# Patient Record
Sex: Female | Born: 1960 | Race: Black or African American | Hispanic: No | Smoking: Former smoker
Health system: Southern US, Community
[De-identification: ages and names within clinical notes are randomized; demographics above are authoritative.]

## PROBLEM LIST (undated history)

## (undated) DIAGNOSIS — S43429A Sprain of unspecified rotator cuff capsule, initial encounter: Secondary | ICD-10-CM

## (undated) DIAGNOSIS — I471 Supraventricular tachycardia, unspecified: Secondary | ICD-10-CM

## (undated) DIAGNOSIS — I272 Pulmonary hypertension, unspecified: Secondary | ICD-10-CM

## (undated) DIAGNOSIS — M549 Dorsalgia, unspecified: Secondary | ICD-10-CM

## (undated) DIAGNOSIS — E876 Hypokalemia: Secondary | ICD-10-CM

## (undated) DIAGNOSIS — F419 Anxiety disorder, unspecified: Secondary | ICD-10-CM

## (undated) DIAGNOSIS — R0602 Shortness of breath: Secondary | ICD-10-CM

## (undated) DIAGNOSIS — Z862 Personal history of diseases of the blood and blood-forming organs and certain disorders involving the immune mechanism: Secondary | ICD-10-CM

## (undated) DIAGNOSIS — I10 Essential (primary) hypertension: Secondary | ICD-10-CM

## (undated) DIAGNOSIS — F29 Unspecified psychosis not due to a substance or known physiological condition: Secondary | ICD-10-CM

## (undated) DIAGNOSIS — N911 Secondary amenorrhea: Secondary | ICD-10-CM

## (undated) DIAGNOSIS — K219 Gastro-esophageal reflux disease without esophagitis: Secondary | ICD-10-CM

## (undated) DIAGNOSIS — R079 Chest pain, unspecified: Secondary | ICD-10-CM

## (undated) DIAGNOSIS — I472 Ventricular tachycardia: Secondary | ICD-10-CM

## (undated) DIAGNOSIS — D869 Sarcoidosis, unspecified: Secondary | ICD-10-CM

## (undated) DIAGNOSIS — G8929 Other chronic pain: Secondary | ICD-10-CM

## (undated) DIAGNOSIS — I509 Heart failure, unspecified: Secondary | ICD-10-CM

## (undated) DIAGNOSIS — I4729 Other ventricular tachycardia: Secondary | ICD-10-CM

## (undated) DIAGNOSIS — J449 Chronic obstructive pulmonary disease, unspecified: Secondary | ICD-10-CM

## (undated) HISTORY — DX: Pulmonary hypertension, unspecified: I27.20

## (undated) HISTORY — DX: Dorsalgia, unspecified: M54.9

## (undated) HISTORY — DX: Chest pain, unspecified: R07.9

## (undated) HISTORY — DX: Other chronic pain: G89.29

## (undated) HISTORY — DX: Other ventricular tachycardia: I47.29

## (undated) HISTORY — DX: Ventricular tachycardia: I47.2

## (undated) HISTORY — DX: Personal history of diseases of the blood and blood-forming organs and certain disorders involving the immune mechanism: Z86.2

## (undated) HISTORY — DX: Anxiety disorder, unspecified: F41.9

## (undated) HISTORY — DX: Heart failure, unspecified: I50.9

## (undated) HISTORY — DX: Sarcoidosis, unspecified: D86.9

## (undated) HISTORY — DX: Sprain of unspecified rotator cuff capsule, initial encounter: S43.429A

## (undated) HISTORY — DX: Gastro-esophageal reflux disease without esophagitis: K21.9

## (undated) HISTORY — DX: Secondary amenorrhea: N91.1

## (undated) HISTORY — DX: Hypokalemia: E87.6

## (undated) HISTORY — DX: Essential (primary) hypertension: I10

## (undated) HISTORY — PX: OTHER SURGICAL HISTORY: SHX169

## (undated) HISTORY — DX: Supraventricular tachycardia, unspecified: I47.10

## (undated) HISTORY — DX: Unspecified psychosis not due to a substance or known physiological condition: F29

## (undated) HISTORY — DX: Supraventricular tachycardia: I47.1

---

## 1997-07-13 ENCOUNTER — Encounter: Admission: RE | Admit: 1997-07-13 | Discharge: 1997-07-13 | Payer: Self-pay | Admitting: Family Medicine

## 1997-09-07 ENCOUNTER — Encounter: Admission: RE | Admit: 1997-09-07 | Discharge: 1997-09-07 | Payer: Self-pay | Admitting: Family Medicine

## 1997-09-13 ENCOUNTER — Encounter: Admission: RE | Admit: 1997-09-13 | Discharge: 1997-09-13 | Payer: Self-pay | Admitting: Family Medicine

## 1997-11-14 ENCOUNTER — Encounter: Admission: RE | Admit: 1997-11-14 | Discharge: 1997-11-14 | Payer: Self-pay | Admitting: Family Medicine

## 1997-11-25 ENCOUNTER — Ambulatory Visit (HOSPITAL_COMMUNITY): Admission: RE | Admit: 1997-11-25 | Discharge: 1997-11-25 | Payer: Self-pay | Admitting: Gastroenterology

## 1997-11-28 ENCOUNTER — Ambulatory Visit (HOSPITAL_COMMUNITY): Admission: RE | Admit: 1997-11-28 | Discharge: 1997-11-28 | Payer: Self-pay | Admitting: Gastroenterology

## 1997-11-28 ENCOUNTER — Encounter: Payer: Self-pay | Admitting: Gastroenterology

## 1997-11-30 ENCOUNTER — Ambulatory Visit (HOSPITAL_COMMUNITY): Admission: RE | Admit: 1997-11-30 | Discharge: 1997-11-30 | Payer: Self-pay | Admitting: Gastroenterology

## 1998-02-14 ENCOUNTER — Encounter: Admission: RE | Admit: 1998-02-14 | Discharge: 1998-02-14 | Payer: Self-pay | Admitting: Family Medicine

## 1998-05-09 ENCOUNTER — Encounter: Admission: RE | Admit: 1998-05-09 | Discharge: 1998-05-09 | Payer: Self-pay | Admitting: Family Medicine

## 1998-06-09 ENCOUNTER — Encounter: Admission: RE | Admit: 1998-06-09 | Discharge: 1998-06-09 | Payer: Self-pay | Admitting: Family Medicine

## 1998-08-28 ENCOUNTER — Encounter: Admission: RE | Admit: 1998-08-28 | Discharge: 1998-08-28 | Payer: Self-pay | Admitting: Family Medicine

## 1998-09-20 ENCOUNTER — Encounter: Admission: RE | Admit: 1998-09-20 | Discharge: 1998-09-20 | Payer: Self-pay | Admitting: Family Medicine

## 1998-11-08 ENCOUNTER — Encounter: Admission: RE | Admit: 1998-11-08 | Discharge: 1998-11-08 | Payer: Self-pay | Admitting: Family Medicine

## 1998-11-24 ENCOUNTER — Encounter: Admission: RE | Admit: 1998-11-24 | Discharge: 1998-11-24 | Payer: Self-pay | Admitting: Family Medicine

## 1998-11-28 ENCOUNTER — Ambulatory Visit (HOSPITAL_COMMUNITY): Admission: RE | Admit: 1998-11-28 | Discharge: 1998-11-28 | Payer: Self-pay | Admitting: Family Medicine

## 1998-12-14 ENCOUNTER — Encounter: Admission: RE | Admit: 1998-12-14 | Discharge: 1998-12-14 | Payer: Self-pay | Admitting: Family Medicine

## 1999-01-04 ENCOUNTER — Encounter: Admission: RE | Admit: 1999-01-04 | Discharge: 1999-01-04 | Payer: Self-pay | Admitting: Family Medicine

## 1999-01-04 ENCOUNTER — Ambulatory Visit (HOSPITAL_COMMUNITY): Admission: RE | Admit: 1999-01-04 | Discharge: 1999-01-04 | Payer: Self-pay | Admitting: Family Medicine

## 1999-01-11 ENCOUNTER — Encounter: Admission: RE | Admit: 1999-01-11 | Discharge: 1999-01-11 | Payer: Self-pay | Admitting: Family Medicine

## 1999-06-22 ENCOUNTER — Encounter: Admission: RE | Admit: 1999-06-22 | Discharge: 1999-06-22 | Payer: Self-pay | Admitting: Family Medicine

## 1999-08-21 ENCOUNTER — Encounter: Admission: RE | Admit: 1999-08-21 | Discharge: 1999-11-19 | Payer: Self-pay | Admitting: *Deleted

## 1999-08-23 ENCOUNTER — Ambulatory Visit (HOSPITAL_COMMUNITY): Admission: RE | Admit: 1999-08-23 | Discharge: 1999-08-23 | Payer: Self-pay | Admitting: Internal Medicine

## 1999-08-23 ENCOUNTER — Encounter: Payer: Self-pay | Admitting: Internal Medicine

## 1999-08-24 ENCOUNTER — Encounter: Admission: RE | Admit: 1999-08-24 | Discharge: 1999-08-24 | Payer: Self-pay | Admitting: *Deleted

## 1999-08-24 ENCOUNTER — Encounter: Payer: Self-pay | Admitting: *Deleted

## 1999-12-19 ENCOUNTER — Encounter: Admission: RE | Admit: 1999-12-19 | Discharge: 1999-12-19 | Payer: Self-pay

## 2000-01-24 ENCOUNTER — Encounter: Admission: RE | Admit: 2000-01-24 | Discharge: 2000-01-24 | Payer: Self-pay | Admitting: Family Medicine

## 2000-10-13 ENCOUNTER — Encounter: Admission: RE | Admit: 2000-10-13 | Discharge: 2000-10-13 | Payer: Self-pay | Admitting: Family Medicine

## 2001-01-02 ENCOUNTER — Encounter: Admission: RE | Admit: 2001-01-02 | Discharge: 2001-01-02 | Payer: Self-pay | Admitting: Family Medicine

## 2001-02-24 ENCOUNTER — Encounter: Payer: Self-pay | Admitting: Internal Medicine

## 2001-02-24 ENCOUNTER — Ambulatory Visit (HOSPITAL_COMMUNITY): Admission: RE | Admit: 2001-02-24 | Discharge: 2001-02-24 | Payer: Self-pay | Admitting: Internal Medicine

## 2001-03-09 ENCOUNTER — Encounter: Admission: RE | Admit: 2001-03-09 | Discharge: 2001-03-09 | Payer: Self-pay | Admitting: Internal Medicine

## 2001-03-09 ENCOUNTER — Encounter: Payer: Self-pay | Admitting: Internal Medicine

## 2001-04-21 ENCOUNTER — Encounter: Admission: RE | Admit: 2001-04-21 | Discharge: 2001-04-21 | Payer: Self-pay | Admitting: Family Medicine

## 2001-08-17 ENCOUNTER — Emergency Department (HOSPITAL_COMMUNITY): Admission: EM | Admit: 2001-08-17 | Discharge: 2001-08-17 | Payer: Self-pay | Admitting: Emergency Medicine

## 2001-08-19 ENCOUNTER — Encounter: Payer: Self-pay | Admitting: Family Medicine

## 2001-08-19 ENCOUNTER — Emergency Department (HOSPITAL_COMMUNITY): Admission: EM | Admit: 2001-08-19 | Discharge: 2001-08-19 | Payer: Self-pay | Admitting: Emergency Medicine

## 2001-08-20 ENCOUNTER — Emergency Department (HOSPITAL_COMMUNITY): Admission: EM | Admit: 2001-08-20 | Discharge: 2001-08-20 | Payer: Self-pay

## 2002-01-25 ENCOUNTER — Encounter: Admission: RE | Admit: 2002-01-25 | Discharge: 2002-01-25 | Payer: Self-pay | Admitting: Family Medicine

## 2002-02-23 ENCOUNTER — Encounter: Admission: RE | Admit: 2002-02-23 | Discharge: 2002-02-23 | Payer: Self-pay | Admitting: Family Medicine

## 2002-04-05 ENCOUNTER — Encounter: Admission: RE | Admit: 2002-04-05 | Discharge: 2002-04-05 | Payer: Self-pay | Admitting: Family Medicine

## 2002-05-12 ENCOUNTER — Encounter: Admission: RE | Admit: 2002-05-12 | Discharge: 2002-05-12 | Payer: Self-pay | Admitting: Family Medicine

## 2002-08-02 ENCOUNTER — Inpatient Hospital Stay (HOSPITAL_COMMUNITY): Admission: EM | Admit: 2002-08-02 | Discharge: 2002-08-03 | Payer: Self-pay | Admitting: Emergency Medicine

## 2002-08-02 ENCOUNTER — Encounter: Payer: Self-pay | Admitting: Emergency Medicine

## 2002-08-10 ENCOUNTER — Encounter: Admission: RE | Admit: 2002-08-10 | Discharge: 2002-08-10 | Payer: Self-pay | Admitting: Family Medicine

## 2002-09-15 ENCOUNTER — Encounter: Admission: RE | Admit: 2002-09-15 | Discharge: 2002-09-15 | Payer: Self-pay | Admitting: Family Medicine

## 2003-02-08 ENCOUNTER — Encounter: Admission: RE | Admit: 2003-02-08 | Discharge: 2003-02-08 | Payer: Self-pay | Admitting: Family Medicine

## 2003-02-18 ENCOUNTER — Encounter: Admission: RE | Admit: 2003-02-18 | Discharge: 2003-02-18 | Payer: Self-pay | Admitting: Family Medicine

## 2003-03-14 ENCOUNTER — Inpatient Hospital Stay (HOSPITAL_COMMUNITY): Admission: EM | Admit: 2003-03-14 | Discharge: 2003-03-30 | Payer: Self-pay | Admitting: Emergency Medicine

## 2003-03-14 ENCOUNTER — Encounter (INDEPENDENT_AMBULATORY_CARE_PROVIDER_SITE_OTHER): Payer: Self-pay | Admitting: Cardiology

## 2003-03-31 HISTORY — PX: CARDIAC CATHETERIZATION: SHX172

## 2003-03-31 HISTORY — PX: OTHER SURGICAL HISTORY: SHX169

## 2003-04-15 ENCOUNTER — Encounter: Admission: RE | Admit: 2003-04-15 | Discharge: 2003-04-15 | Payer: Self-pay | Admitting: Family Medicine

## 2003-04-26 ENCOUNTER — Encounter: Admission: RE | Admit: 2003-04-26 | Discharge: 2003-04-26 | Payer: Self-pay | Admitting: Family Medicine

## 2003-05-02 ENCOUNTER — Inpatient Hospital Stay (HOSPITAL_COMMUNITY): Admission: EM | Admit: 2003-05-02 | Discharge: 2003-05-04 | Payer: Self-pay | Admitting: Emergency Medicine

## 2003-05-10 ENCOUNTER — Encounter: Admission: RE | Admit: 2003-05-10 | Discharge: 2003-08-08 | Payer: Self-pay | Admitting: Internal Medicine

## 2003-05-11 ENCOUNTER — Encounter: Admission: RE | Admit: 2003-05-11 | Discharge: 2003-05-11 | Payer: Self-pay | Admitting: Family Medicine

## 2003-05-23 ENCOUNTER — Ambulatory Visit (HOSPITAL_COMMUNITY): Admission: RE | Admit: 2003-05-23 | Discharge: 2003-05-23 | Payer: Self-pay | Admitting: Internal Medicine

## 2003-05-25 ENCOUNTER — Encounter: Admission: RE | Admit: 2003-05-25 | Discharge: 2003-05-25 | Payer: Self-pay | Admitting: Family Medicine

## 2003-06-23 ENCOUNTER — Encounter: Admission: RE | Admit: 2003-06-23 | Discharge: 2003-06-23 | Payer: Self-pay | Admitting: Sports Medicine

## 2003-06-27 ENCOUNTER — Emergency Department (HOSPITAL_COMMUNITY): Admission: EM | Admit: 2003-06-27 | Discharge: 2003-06-27 | Payer: Self-pay | Admitting: Emergency Medicine

## 2003-06-28 ENCOUNTER — Emergency Department (HOSPITAL_COMMUNITY): Admission: EM | Admit: 2003-06-28 | Discharge: 2003-06-28 | Payer: Self-pay | Admitting: Emergency Medicine

## 2003-07-07 ENCOUNTER — Encounter: Admission: RE | Admit: 2003-07-07 | Discharge: 2003-07-07 | Payer: Self-pay | Admitting: Sports Medicine

## 2003-08-24 ENCOUNTER — Encounter: Admission: RE | Admit: 2003-08-24 | Discharge: 2003-08-24 | Payer: Self-pay | Admitting: Family Medicine

## 2003-09-16 ENCOUNTER — Encounter: Admission: RE | Admit: 2003-09-16 | Discharge: 2003-09-16 | Payer: Self-pay | Admitting: Sports Medicine

## 2003-12-04 ENCOUNTER — Emergency Department (HOSPITAL_COMMUNITY): Admission: EM | Admit: 2003-12-04 | Discharge: 2003-12-04 | Payer: Self-pay | Admitting: Emergency Medicine

## 2004-02-08 ENCOUNTER — Ambulatory Visit: Payer: Self-pay | Admitting: Family Medicine

## 2004-02-15 ENCOUNTER — Ambulatory Visit: Payer: Self-pay | Admitting: Family Medicine

## 2004-03-15 ENCOUNTER — Ambulatory Visit: Payer: Self-pay | Admitting: Internal Medicine

## 2004-06-05 ENCOUNTER — Emergency Department (HOSPITAL_COMMUNITY): Admission: EM | Admit: 2004-06-05 | Discharge: 2004-06-05 | Payer: Self-pay | Admitting: Emergency Medicine

## 2004-07-16 ENCOUNTER — Ambulatory Visit: Payer: Self-pay

## 2004-07-27 ENCOUNTER — Ambulatory Visit: Payer: Self-pay | Admitting: Family Medicine

## 2004-12-07 ENCOUNTER — Encounter (INDEPENDENT_AMBULATORY_CARE_PROVIDER_SITE_OTHER): Payer: Self-pay | Admitting: *Deleted

## 2004-12-07 LAB — CONVERTED CEMR LAB

## 2004-12-13 ENCOUNTER — Ambulatory Visit: Payer: Self-pay | Admitting: Family Medicine

## 2004-12-13 ENCOUNTER — Encounter (INDEPENDENT_AMBULATORY_CARE_PROVIDER_SITE_OTHER): Payer: Self-pay | Admitting: Specialist

## 2004-12-31 ENCOUNTER — Ambulatory Visit: Payer: Self-pay | Admitting: Family Medicine

## 2004-12-31 ENCOUNTER — Inpatient Hospital Stay (HOSPITAL_COMMUNITY): Admission: EM | Admit: 2004-12-31 | Discharge: 2005-01-02 | Payer: Self-pay | Admitting: Emergency Medicine

## 2005-01-09 ENCOUNTER — Ambulatory Visit: Payer: Self-pay | Admitting: Family Medicine

## 2005-01-21 ENCOUNTER — Inpatient Hospital Stay (HOSPITAL_COMMUNITY): Admission: EM | Admit: 2005-01-21 | Discharge: 2005-01-27 | Payer: Self-pay | Admitting: Emergency Medicine

## 2005-01-21 ENCOUNTER — Ambulatory Visit: Payer: Self-pay | Admitting: Sports Medicine

## 2005-01-22 ENCOUNTER — Ambulatory Visit: Payer: Self-pay | Admitting: Critical Care Medicine

## 2005-01-23 ENCOUNTER — Encounter: Payer: Self-pay | Admitting: Cardiovascular Disease

## 2005-01-23 ENCOUNTER — Ambulatory Visit: Payer: Self-pay | Admitting: Cardiovascular Disease

## 2005-02-01 ENCOUNTER — Ambulatory Visit: Payer: Self-pay | Admitting: Family Medicine

## 2005-02-08 ENCOUNTER — Ambulatory Visit: Payer: Self-pay | Admitting: Internal Medicine

## 2005-03-11 ENCOUNTER — Ambulatory Visit: Payer: Self-pay | Admitting: Internal Medicine

## 2005-04-23 ENCOUNTER — Ambulatory Visit: Payer: Self-pay | Admitting: Internal Medicine

## 2005-05-16 ENCOUNTER — Emergency Department (HOSPITAL_COMMUNITY): Admission: EM | Admit: 2005-05-16 | Discharge: 2005-05-16 | Payer: Self-pay | Admitting: Family Medicine

## 2005-05-20 ENCOUNTER — Ambulatory Visit: Payer: Self-pay

## 2005-05-27 ENCOUNTER — Ambulatory Visit: Payer: Self-pay | Admitting: Family Medicine

## 2005-06-07 ENCOUNTER — Encounter: Admission: RE | Admit: 2005-06-07 | Discharge: 2005-07-09 | Payer: Self-pay | Admitting: Internal Medicine

## 2005-06-22 ENCOUNTER — Emergency Department (HOSPITAL_COMMUNITY): Admission: EM | Admit: 2005-06-22 | Discharge: 2005-06-22 | Payer: Self-pay | Admitting: *Deleted

## 2005-06-25 ENCOUNTER — Ambulatory Visit: Payer: Self-pay | Admitting: Family Medicine

## 2005-06-25 ENCOUNTER — Inpatient Hospital Stay (HOSPITAL_COMMUNITY): Admission: EM | Admit: 2005-06-25 | Discharge: 2005-06-28 | Payer: Self-pay | Admitting: *Deleted

## 2005-07-03 ENCOUNTER — Ambulatory Visit: Payer: Self-pay | Admitting: Family Medicine

## 2005-07-18 ENCOUNTER — Ambulatory Visit: Payer: Self-pay | Admitting: Internal Medicine

## 2005-08-01 ENCOUNTER — Encounter (HOSPITAL_COMMUNITY): Admission: RE | Admit: 2005-08-01 | Discharge: 2005-09-10 | Payer: Self-pay | Admitting: Internal Medicine

## 2005-08-07 ENCOUNTER — Ambulatory Visit: Payer: Self-pay | Admitting: Family Medicine

## 2005-08-13 ENCOUNTER — Ambulatory Visit: Payer: Self-pay | Admitting: Family Medicine

## 2005-08-13 ENCOUNTER — Inpatient Hospital Stay (HOSPITAL_COMMUNITY): Admission: EM | Admit: 2005-08-13 | Discharge: 2005-08-16 | Payer: Self-pay | Admitting: Emergency Medicine

## 2005-08-19 ENCOUNTER — Emergency Department (HOSPITAL_COMMUNITY): Admission: EM | Admit: 2005-08-19 | Discharge: 2005-08-19 | Payer: Self-pay | Admitting: Emergency Medicine

## 2005-08-20 ENCOUNTER — Ambulatory Visit (HOSPITAL_COMMUNITY): Admission: RE | Admit: 2005-08-20 | Discharge: 2005-08-20 | Payer: Self-pay | Admitting: Family Medicine

## 2005-08-20 ENCOUNTER — Ambulatory Visit: Payer: Self-pay | Admitting: Sports Medicine

## 2005-08-23 ENCOUNTER — Ambulatory Visit: Payer: Self-pay | Admitting: Family Medicine

## 2005-08-24 ENCOUNTER — Encounter: Payer: Self-pay | Admitting: *Deleted

## 2005-08-25 ENCOUNTER — Inpatient Hospital Stay (HOSPITAL_COMMUNITY): Admission: EM | Admit: 2005-08-25 | Discharge: 2005-08-28 | Payer: Self-pay | Admitting: Emergency Medicine

## 2005-08-25 ENCOUNTER — Ambulatory Visit: Payer: Self-pay | Admitting: Family Medicine

## 2005-08-29 ENCOUNTER — Emergency Department (HOSPITAL_COMMUNITY): Admission: EM | Admit: 2005-08-29 | Discharge: 2005-08-29 | Payer: Self-pay | Admitting: Emergency Medicine

## 2005-09-05 ENCOUNTER — Ambulatory Visit: Payer: Self-pay | Admitting: Family Medicine

## 2005-09-09 ENCOUNTER — Ambulatory Visit (HOSPITAL_COMMUNITY): Payer: Self-pay | Admitting: Psychiatry

## 2005-09-27 ENCOUNTER — Ambulatory Visit (HOSPITAL_COMMUNITY): Payer: Self-pay | Admitting: Psychiatry

## 2005-10-11 ENCOUNTER — Ambulatory Visit: Payer: Self-pay | Admitting: Family Medicine

## 2005-10-17 ENCOUNTER — Ambulatory Visit: Payer: Self-pay | Admitting: Obstetrics & Gynecology

## 2005-10-17 ENCOUNTER — Emergency Department (HOSPITAL_COMMUNITY): Admission: EM | Admit: 2005-10-17 | Discharge: 2005-10-17 | Payer: Self-pay | Admitting: *Deleted

## 2005-11-18 ENCOUNTER — Ambulatory Visit: Payer: Self-pay | Admitting: Internal Medicine

## 2005-11-25 ENCOUNTER — Ambulatory Visit: Payer: Self-pay | Admitting: Family Medicine

## 2005-12-12 ENCOUNTER — Encounter (INDEPENDENT_AMBULATORY_CARE_PROVIDER_SITE_OTHER): Payer: Self-pay | Admitting: Cardiology

## 2005-12-12 ENCOUNTER — Inpatient Hospital Stay (HOSPITAL_COMMUNITY): Admission: EM | Admit: 2005-12-12 | Discharge: 2005-12-15 | Payer: Self-pay | Admitting: Emergency Medicine

## 2005-12-12 ENCOUNTER — Ambulatory Visit: Payer: Self-pay | Admitting: Sports Medicine

## 2005-12-18 ENCOUNTER — Encounter: Admission: RE | Admit: 2005-12-18 | Discharge: 2005-12-18 | Payer: Self-pay | Admitting: Sports Medicine

## 2005-12-24 ENCOUNTER — Emergency Department (HOSPITAL_COMMUNITY): Admission: EM | Admit: 2005-12-24 | Discharge: 2005-12-25 | Payer: Self-pay | Admitting: Emergency Medicine

## 2005-12-24 ENCOUNTER — Ambulatory Visit: Payer: Self-pay | Admitting: Family Medicine

## 2005-12-26 ENCOUNTER — Ambulatory Visit: Payer: Self-pay | Admitting: Psychiatry

## 2005-12-26 ENCOUNTER — Ambulatory Visit: Payer: Self-pay | Admitting: Family Medicine

## 2005-12-26 ENCOUNTER — Inpatient Hospital Stay (HOSPITAL_COMMUNITY): Admission: EM | Admit: 2005-12-26 | Discharge: 2005-12-29 | Payer: Self-pay | Admitting: Family Medicine

## 2006-01-01 ENCOUNTER — Emergency Department (HOSPITAL_COMMUNITY): Admission: EM | Admit: 2006-01-01 | Discharge: 2006-01-01 | Payer: Self-pay | Admitting: Emergency Medicine

## 2006-01-01 ENCOUNTER — Inpatient Hospital Stay (HOSPITAL_COMMUNITY): Admission: AD | Admit: 2006-01-01 | Discharge: 2006-01-07 | Payer: Self-pay | Admitting: Psychiatry

## 2006-01-04 ENCOUNTER — Ambulatory Visit (HOSPITAL_COMMUNITY): Admission: RE | Admit: 2006-01-04 | Discharge: 2006-01-04 | Payer: Self-pay | Admitting: Psychiatry

## 2006-01-20 ENCOUNTER — Ambulatory Visit: Payer: Self-pay | Admitting: Family Medicine

## 2006-01-31 ENCOUNTER — Ambulatory Visit: Payer: Self-pay | Admitting: Family Medicine

## 2006-02-15 ENCOUNTER — Ambulatory Visit: Payer: Self-pay | Admitting: Family Medicine

## 2006-02-15 ENCOUNTER — Inpatient Hospital Stay (HOSPITAL_COMMUNITY): Admission: EM | Admit: 2006-02-15 | Discharge: 2006-02-17 | Payer: Self-pay | Admitting: Emergency Medicine

## 2006-02-16 ENCOUNTER — Ambulatory Visit: Payer: Self-pay | Admitting: *Deleted

## 2006-02-17 ENCOUNTER — Inpatient Hospital Stay (HOSPITAL_COMMUNITY): Admission: AD | Admit: 2006-02-17 | Discharge: 2006-02-24 | Payer: Self-pay | Admitting: *Deleted

## 2006-03-21 ENCOUNTER — Ambulatory Visit: Payer: Self-pay | Admitting: Internal Medicine

## 2006-04-14 ENCOUNTER — Encounter: Payer: Self-pay | Admitting: Family Medicine

## 2006-04-14 ENCOUNTER — Ambulatory Visit: Payer: Self-pay | Admitting: Family Medicine

## 2006-04-14 LAB — CONVERTED CEMR LAB
AST: 16 units/L (ref 0–37)
BUN: 15 mg/dL (ref 6–23)
Calcium: 9.9 mg/dL (ref 8.4–10.5)
Chloride: 98 meq/L (ref 96–112)
Creatinine, Ser: 0.9 mg/dL (ref 0.40–1.20)

## 2006-06-05 DIAGNOSIS — K219 Gastro-esophageal reflux disease without esophagitis: Secondary | ICD-10-CM

## 2006-06-05 DIAGNOSIS — I10 Essential (primary) hypertension: Secondary | ICD-10-CM | POA: Insufficient documentation

## 2006-06-05 DIAGNOSIS — F411 Generalized anxiety disorder: Secondary | ICD-10-CM | POA: Insufficient documentation

## 2006-06-05 DIAGNOSIS — I428 Other cardiomyopathies: Secondary | ICD-10-CM | POA: Insufficient documentation

## 2006-06-06 ENCOUNTER — Encounter (INDEPENDENT_AMBULATORY_CARE_PROVIDER_SITE_OTHER): Payer: Self-pay | Admitting: *Deleted

## 2006-06-23 ENCOUNTER — Telehealth (INDEPENDENT_AMBULATORY_CARE_PROVIDER_SITE_OTHER): Payer: Self-pay | Admitting: *Deleted

## 2006-06-23 ENCOUNTER — Telehealth: Payer: Self-pay | Admitting: Family Medicine

## 2006-07-15 ENCOUNTER — Telehealth: Payer: Self-pay | Admitting: Family Medicine

## 2006-08-08 ENCOUNTER — Encounter: Payer: Self-pay | Admitting: Family Medicine

## 2006-08-08 ENCOUNTER — Ambulatory Visit: Payer: Self-pay | Admitting: Family Medicine

## 2006-08-08 LAB — CONVERTED CEMR LAB
ALT: 13 units/L (ref 0–35)
AST: 13 units/L (ref 0–37)
Alkaline Phosphatase: 53 units/L (ref 39–117)
Creatinine, Ser: 0.92 mg/dL (ref 0.40–1.20)
Total Bilirubin: 0.5 mg/dL (ref 0.3–1.2)

## 2006-09-16 ENCOUNTER — Telehealth: Payer: Self-pay | Admitting: Family Medicine

## 2006-09-22 ENCOUNTER — Telehealth (INDEPENDENT_AMBULATORY_CARE_PROVIDER_SITE_OTHER): Payer: Self-pay | Admitting: *Deleted

## 2006-09-22 ENCOUNTER — Encounter: Payer: Self-pay | Admitting: Family Medicine

## 2006-10-02 ENCOUNTER — Encounter: Payer: Self-pay | Admitting: Family Medicine

## 2006-10-06 ENCOUNTER — Emergency Department (HOSPITAL_COMMUNITY): Admission: EM | Admit: 2006-10-06 | Discharge: 2006-10-07 | Payer: Self-pay | Admitting: Emergency Medicine

## 2006-11-06 ENCOUNTER — Telehealth (INDEPENDENT_AMBULATORY_CARE_PROVIDER_SITE_OTHER): Payer: Self-pay | Admitting: Family Medicine

## 2006-12-23 ENCOUNTER — Telehealth: Payer: Self-pay | Admitting: *Deleted

## 2006-12-26 ENCOUNTER — Encounter (INDEPENDENT_AMBULATORY_CARE_PROVIDER_SITE_OTHER): Payer: Self-pay | Admitting: Family Medicine

## 2007-02-05 ENCOUNTER — Ambulatory Visit: Payer: Self-pay | Admitting: Family Medicine

## 2007-03-11 ENCOUNTER — Ambulatory Visit: Payer: Self-pay | Admitting: Internal Medicine

## 2007-05-04 ENCOUNTER — Encounter (INDEPENDENT_AMBULATORY_CARE_PROVIDER_SITE_OTHER): Payer: Self-pay | Admitting: Family Medicine

## 2007-05-04 ENCOUNTER — Ambulatory Visit: Payer: Self-pay | Admitting: Family Medicine

## 2007-05-04 LAB — CONVERTED CEMR LAB
Angiotensin 1 Converting Enzyme: 26 units/L (ref 9–67)
BUN: 18 mg/dL (ref 6–23)
CO2: 27 meq/L (ref 19–32)
Chloride: 100 meq/L (ref 96–112)
Creatinine, Ser: 1.01 mg/dL (ref 0.40–1.20)
HDL: 70 mg/dL (ref >39)
LDL Cholesterol: 111 mg/dL — ABNORMAL HIGH (ref 0–99)

## 2007-05-09 ENCOUNTER — Encounter (INDEPENDENT_AMBULATORY_CARE_PROVIDER_SITE_OTHER): Payer: Self-pay | Admitting: *Deleted

## 2007-06-27 ENCOUNTER — Ambulatory Visit: Payer: Self-pay | Admitting: Family Medicine

## 2007-06-27 ENCOUNTER — Inpatient Hospital Stay (HOSPITAL_COMMUNITY): Admission: EM | Admit: 2007-06-27 | Discharge: 2007-07-01 | Payer: Self-pay | Admitting: Emergency Medicine

## 2007-06-27 ENCOUNTER — Encounter: Payer: Self-pay | Admitting: Family Medicine

## 2007-07-08 ENCOUNTER — Encounter: Payer: Self-pay | Admitting: Family Medicine

## 2007-07-09 ENCOUNTER — Ambulatory Visit: Payer: Self-pay | Admitting: Family Medicine

## 2007-07-09 DIAGNOSIS — R0902 Hypoxemia: Secondary | ICD-10-CM | POA: Insufficient documentation

## 2007-07-15 ENCOUNTER — Ambulatory Visit: Payer: Self-pay | Admitting: Family Medicine

## 2007-07-15 ENCOUNTER — Encounter (INDEPENDENT_AMBULATORY_CARE_PROVIDER_SITE_OTHER): Payer: Self-pay | Admitting: Family Medicine

## 2007-07-23 LAB — CONVERTED CEMR LAB
BUN: 16 mg/dL (ref 6–23)
Chloride: 104 meq/L (ref 96–112)
Hemoglobin: 11 g/dL — ABNORMAL LOW (ref 12.0–15.0)
Potassium: 4.2 meq/L (ref 3.5–5.3)
RBC: 3.78 M/uL — ABNORMAL LOW (ref 3.87–5.11)
RDW: 15.1 % (ref 11.5–15.5)
Sodium: 141 meq/L (ref 135–145)

## 2007-07-29 ENCOUNTER — Ambulatory Visit: Payer: Self-pay | Admitting: Family Medicine

## 2007-07-29 ENCOUNTER — Encounter (INDEPENDENT_AMBULATORY_CARE_PROVIDER_SITE_OTHER): Payer: Self-pay | Admitting: Family Medicine

## 2007-07-29 LAB — CONVERTED CEMR LAB
Ferritin: 38 ng/mL (ref 10–291)
Saturation Ratios: 18 % — ABNORMAL LOW (ref 20–55)
UIBC: 270 ug/dL

## 2007-09-03 ENCOUNTER — Ambulatory Visit: Payer: Self-pay | Admitting: Family Medicine

## 2007-09-03 LAB — CONVERTED CEMR LAB: Hemoglobin: 12.9 g/dL

## 2007-09-18 ENCOUNTER — Encounter (INDEPENDENT_AMBULATORY_CARE_PROVIDER_SITE_OTHER): Payer: Self-pay | Admitting: Family Medicine

## 2007-10-06 ENCOUNTER — Ambulatory Visit: Payer: Self-pay | Admitting: Family Medicine

## 2007-11-20 ENCOUNTER — Telehealth: Payer: Self-pay | Admitting: *Deleted

## 2007-12-04 ENCOUNTER — Emergency Department (HOSPITAL_COMMUNITY): Admission: EM | Admit: 2007-12-04 | Discharge: 2007-12-04 | Payer: Self-pay | Admitting: Emergency Medicine

## 2007-12-07 ENCOUNTER — Ambulatory Visit: Payer: Self-pay | Admitting: Internal Medicine

## 2007-12-08 ENCOUNTER — Encounter (INDEPENDENT_AMBULATORY_CARE_PROVIDER_SITE_OTHER): Payer: Self-pay | Admitting: Family Medicine

## 2007-12-10 ENCOUNTER — Telehealth: Payer: Self-pay | Admitting: *Deleted

## 2007-12-17 ENCOUNTER — Telehealth: Payer: Self-pay | Admitting: *Deleted

## 2007-12-22 ENCOUNTER — Telehealth: Payer: Self-pay | Admitting: *Deleted

## 2007-12-22 ENCOUNTER — Ambulatory Visit: Payer: Self-pay | Admitting: Family Medicine

## 2007-12-24 ENCOUNTER — Ambulatory Visit: Payer: Self-pay | Admitting: Family Medicine

## 2007-12-25 ENCOUNTER — Encounter: Payer: Self-pay | Admitting: *Deleted

## 2007-12-31 ENCOUNTER — Encounter: Payer: Self-pay | Admitting: *Deleted

## 2007-12-31 ENCOUNTER — Ambulatory Visit: Payer: Self-pay | Admitting: Family Medicine

## 2008-01-06 ENCOUNTER — Ambulatory Visit: Payer: Self-pay | Admitting: Internal Medicine

## 2008-02-01 ENCOUNTER — Telehealth: Payer: Self-pay | Admitting: *Deleted

## 2008-02-05 ENCOUNTER — Ambulatory Visit: Payer: Self-pay | Admitting: Family Medicine

## 2008-03-22 ENCOUNTER — Ambulatory Visit: Payer: Self-pay | Admitting: Internal Medicine

## 2008-04-11 ENCOUNTER — Inpatient Hospital Stay (HOSPITAL_COMMUNITY): Admission: EM | Admit: 2008-04-11 | Discharge: 2008-04-26 | Payer: Self-pay | Admitting: Emergency Medicine

## 2008-04-11 ENCOUNTER — Telehealth (INDEPENDENT_AMBULATORY_CARE_PROVIDER_SITE_OTHER): Payer: Self-pay | Admitting: *Deleted

## 2008-04-11 ENCOUNTER — Encounter: Payer: Self-pay | Admitting: Internal Medicine

## 2008-04-11 ENCOUNTER — Ambulatory Visit: Payer: Self-pay | Admitting: Family Medicine

## 2008-04-12 ENCOUNTER — Encounter: Payer: Self-pay | Admitting: Family Medicine

## 2008-04-18 ENCOUNTER — Encounter (INDEPENDENT_AMBULATORY_CARE_PROVIDER_SITE_OTHER): Payer: Self-pay | Admitting: Family Medicine

## 2008-04-19 ENCOUNTER — Telehealth: Payer: Self-pay | Admitting: Internal Medicine

## 2008-04-28 ENCOUNTER — Telehealth: Payer: Self-pay | Admitting: *Deleted

## 2008-05-02 ENCOUNTER — Ambulatory Visit: Payer: Self-pay | Admitting: Internal Medicine

## 2008-05-02 ENCOUNTER — Encounter (INDEPENDENT_AMBULATORY_CARE_PROVIDER_SITE_OTHER): Payer: Self-pay | Admitting: Family Medicine

## 2008-05-02 ENCOUNTER — Telehealth: Payer: Self-pay | Admitting: *Deleted

## 2008-05-03 ENCOUNTER — Telehealth (INDEPENDENT_AMBULATORY_CARE_PROVIDER_SITE_OTHER): Payer: Self-pay | Admitting: Family Medicine

## 2008-05-03 ENCOUNTER — Ambulatory Visit: Payer: Self-pay | Admitting: Family Medicine

## 2008-05-03 DIAGNOSIS — D869 Sarcoidosis, unspecified: Secondary | ICD-10-CM | POA: Insufficient documentation

## 2008-05-04 ENCOUNTER — Telehealth: Payer: Self-pay | Admitting: *Deleted

## 2008-05-06 ENCOUNTER — Telehealth (INDEPENDENT_AMBULATORY_CARE_PROVIDER_SITE_OTHER): Payer: Self-pay | Admitting: Family Medicine

## 2008-05-10 ENCOUNTER — Telehealth: Payer: Self-pay | Admitting: *Deleted

## 2008-05-28 ENCOUNTER — Emergency Department (HOSPITAL_COMMUNITY): Admission: EM | Admit: 2008-05-28 | Discharge: 2008-05-28 | Payer: Self-pay | Admitting: Emergency Medicine

## 2008-05-29 ENCOUNTER — Emergency Department (HOSPITAL_COMMUNITY): Admission: EM | Admit: 2008-05-29 | Discharge: 2008-05-29 | Payer: Self-pay | Admitting: Emergency Medicine

## 2008-05-30 ENCOUNTER — Telehealth (INDEPENDENT_AMBULATORY_CARE_PROVIDER_SITE_OTHER): Payer: Self-pay | Admitting: Family Medicine

## 2008-06-02 ENCOUNTER — Ambulatory Visit: Payer: Self-pay | Admitting: Family Medicine

## 2008-06-03 ENCOUNTER — Telehealth: Payer: Self-pay | Admitting: *Deleted

## 2008-06-06 ENCOUNTER — Telehealth: Payer: Self-pay | Admitting: *Deleted

## 2008-06-10 ENCOUNTER — Telehealth (INDEPENDENT_AMBULATORY_CARE_PROVIDER_SITE_OTHER): Payer: Self-pay | Admitting: Family Medicine

## 2008-06-10 ENCOUNTER — Encounter: Payer: Self-pay | Admitting: Family Medicine

## 2008-06-10 ENCOUNTER — Ambulatory Visit: Payer: Self-pay | Admitting: Family Medicine

## 2008-06-10 LAB — CONVERTED CEMR LAB
AST: 13 units/L (ref 0–37)
Alkaline Phosphatase: 52 units/L (ref 39–117)
BUN: 13 mg/dL (ref 6–23)
Band Neutrophils: 0 % (ref 0–10)
Basophils Absolute: 0 10*3/uL (ref 0.0–0.1)
Basophils Relative: 1 % (ref 0–1)
Creatinine, Ser: 0.84 mg/dL (ref 0.40–1.20)
Eosinophils Absolute: 0.3 10*3/uL (ref 0.0–0.7)
Eosinophils Relative: 4 % (ref 0–5)
Glucose, Bld: 99 mg/dL (ref 70–99)
Lymphocytes Relative: 20 % (ref 12–46)
MCHC: 32.2 g/dL (ref 30.0–36.0)
MCV: 89.3 fL (ref 78.0–100.0)
Platelets: 261 10*3/uL (ref 150–400)
RDW: 15.1 % (ref 11.5–15.5)
Total Bilirubin: 0.7 mg/dL (ref 0.3–1.2)
WBC: 6.8 10*3/uL (ref 4.0–10.5)

## 2008-06-13 ENCOUNTER — Encounter: Admission: RE | Admit: 2008-06-13 | Discharge: 2008-06-13 | Payer: Self-pay | Admitting: Sports Medicine

## 2008-06-16 ENCOUNTER — Telehealth (INDEPENDENT_AMBULATORY_CARE_PROVIDER_SITE_OTHER): Payer: Self-pay | Admitting: Family Medicine

## 2008-06-17 ENCOUNTER — Ambulatory Visit: Payer: Self-pay | Admitting: Family Medicine

## 2008-06-20 ENCOUNTER — Ambulatory Visit: Payer: Self-pay | Admitting: Family Medicine

## 2008-06-21 ENCOUNTER — Telehealth: Payer: Self-pay | Admitting: *Deleted

## 2008-06-22 ENCOUNTER — Telehealth: Payer: Self-pay | Admitting: *Deleted

## 2008-06-24 ENCOUNTER — Ambulatory Visit: Payer: Self-pay | Admitting: Family Medicine

## 2008-07-04 ENCOUNTER — Ambulatory Visit: Payer: Self-pay | Admitting: Internal Medicine

## 2008-07-05 ENCOUNTER — Encounter: Payer: Self-pay | Admitting: Internal Medicine

## 2008-07-06 ENCOUNTER — Telehealth: Payer: Self-pay | Admitting: Internal Medicine

## 2008-07-20 ENCOUNTER — Telehealth (INDEPENDENT_AMBULATORY_CARE_PROVIDER_SITE_OTHER): Payer: Self-pay | Admitting: *Deleted

## 2008-07-27 ENCOUNTER — Telehealth (INDEPENDENT_AMBULATORY_CARE_PROVIDER_SITE_OTHER): Payer: Self-pay | Admitting: *Deleted

## 2008-07-27 ENCOUNTER — Telehealth (INDEPENDENT_AMBULATORY_CARE_PROVIDER_SITE_OTHER): Payer: Self-pay | Admitting: Family Medicine

## 2008-08-08 ENCOUNTER — Telehealth (INDEPENDENT_AMBULATORY_CARE_PROVIDER_SITE_OTHER): Payer: Self-pay | Admitting: *Deleted

## 2008-08-08 ENCOUNTER — Telehealth (INDEPENDENT_AMBULATORY_CARE_PROVIDER_SITE_OTHER): Payer: Self-pay | Admitting: Family Medicine

## 2008-08-09 ENCOUNTER — Telehealth (INDEPENDENT_AMBULATORY_CARE_PROVIDER_SITE_OTHER): Payer: Self-pay | Admitting: *Deleted

## 2008-08-11 ENCOUNTER — Inpatient Hospital Stay (HOSPITAL_COMMUNITY): Admission: EM | Admit: 2008-08-11 | Discharge: 2008-08-16 | Payer: Self-pay | Admitting: Emergency Medicine

## 2008-08-11 ENCOUNTER — Ambulatory Visit: Payer: Self-pay | Admitting: Family Medicine

## 2008-08-11 ENCOUNTER — Telehealth: Payer: Self-pay | Admitting: *Deleted

## 2008-08-12 ENCOUNTER — Telehealth: Payer: Self-pay | Admitting: Internal Medicine

## 2008-08-16 ENCOUNTER — Encounter (INDEPENDENT_AMBULATORY_CARE_PROVIDER_SITE_OTHER): Payer: Self-pay | Admitting: Family Medicine

## 2008-08-18 ENCOUNTER — Telehealth: Payer: Self-pay | Admitting: *Deleted

## 2008-08-19 ENCOUNTER — Telehealth: Payer: Self-pay | Admitting: *Deleted

## 2008-08-19 ENCOUNTER — Telehealth (INDEPENDENT_AMBULATORY_CARE_PROVIDER_SITE_OTHER): Payer: Self-pay | Admitting: Family Medicine

## 2008-08-21 ENCOUNTER — Telehealth (INDEPENDENT_AMBULATORY_CARE_PROVIDER_SITE_OTHER): Payer: Self-pay | Admitting: Family Medicine

## 2008-08-22 ENCOUNTER — Telehealth: Payer: Self-pay | Admitting: Internal Medicine

## 2008-08-22 ENCOUNTER — Encounter: Payer: Self-pay | Admitting: Family Medicine

## 2008-08-22 DIAGNOSIS — I498 Other specified cardiac arrhythmias: Secondary | ICD-10-CM | POA: Insufficient documentation

## 2008-08-23 ENCOUNTER — Inpatient Hospital Stay (HOSPITAL_COMMUNITY): Admission: EM | Admit: 2008-08-23 | Discharge: 2008-08-30 | Payer: Self-pay | Admitting: Emergency Medicine

## 2008-08-23 ENCOUNTER — Telehealth (INDEPENDENT_AMBULATORY_CARE_PROVIDER_SITE_OTHER): Payer: Self-pay | Admitting: Family Medicine

## 2008-08-23 ENCOUNTER — Ambulatory Visit: Payer: Self-pay | Admitting: Family Medicine

## 2008-08-23 ENCOUNTER — Ambulatory Visit: Payer: Self-pay | Admitting: Cardiology

## 2008-08-24 ENCOUNTER — Encounter: Payer: Self-pay | Admitting: Cardiology

## 2008-08-26 HISTORY — PX: CARDIAC CATHETERIZATION: SHX172

## 2008-08-30 ENCOUNTER — Telehealth (INDEPENDENT_AMBULATORY_CARE_PROVIDER_SITE_OTHER): Payer: Self-pay | Admitting: *Deleted

## 2008-09-02 ENCOUNTER — Ambulatory Visit: Payer: Self-pay | Admitting: Internal Medicine

## 2008-09-05 DIAGNOSIS — IMO0002 Reserved for concepts with insufficient information to code with codable children: Secondary | ICD-10-CM

## 2008-09-06 ENCOUNTER — Telehealth: Payer: Self-pay | Admitting: *Deleted

## 2008-09-08 ENCOUNTER — Encounter (INDEPENDENT_AMBULATORY_CARE_PROVIDER_SITE_OTHER): Payer: Self-pay | Admitting: Family Medicine

## 2008-09-09 ENCOUNTER — Telehealth: Payer: Self-pay | Admitting: Family Medicine

## 2008-09-09 ENCOUNTER — Encounter (INDEPENDENT_AMBULATORY_CARE_PROVIDER_SITE_OTHER): Payer: Self-pay | Admitting: *Deleted

## 2008-09-10 ENCOUNTER — Ambulatory Visit: Payer: Self-pay | Admitting: Family Medicine

## 2008-09-10 ENCOUNTER — Inpatient Hospital Stay (HOSPITAL_COMMUNITY): Admission: EM | Admit: 2008-09-10 | Discharge: 2008-09-14 | Payer: Self-pay | Admitting: Emergency Medicine

## 2008-09-10 ENCOUNTER — Encounter: Payer: Self-pay | Admitting: Family Medicine

## 2008-09-14 ENCOUNTER — Encounter (INDEPENDENT_AMBULATORY_CARE_PROVIDER_SITE_OTHER): Payer: Self-pay | Admitting: *Deleted

## 2008-09-15 ENCOUNTER — Telehealth (INDEPENDENT_AMBULATORY_CARE_PROVIDER_SITE_OTHER): Payer: Self-pay | Admitting: Family Medicine

## 2008-09-21 ENCOUNTER — Encounter: Payer: Self-pay | Admitting: Internal Medicine

## 2008-09-21 ENCOUNTER — Encounter (INDEPENDENT_AMBULATORY_CARE_PROVIDER_SITE_OTHER): Payer: Self-pay | Admitting: Family Medicine

## 2008-09-21 ENCOUNTER — Ambulatory Visit: Payer: Self-pay | Admitting: Cardiology

## 2008-09-21 DIAGNOSIS — I509 Heart failure, unspecified: Secondary | ICD-10-CM | POA: Insufficient documentation

## 2008-09-22 ENCOUNTER — Encounter: Payer: Self-pay | Admitting: Cardiology

## 2008-09-22 ENCOUNTER — Encounter: Payer: Self-pay | Admitting: Internal Medicine

## 2008-09-30 ENCOUNTER — Ambulatory Visit: Payer: Self-pay | Admitting: Cardiology

## 2008-09-30 ENCOUNTER — Telehealth: Payer: Self-pay | Admitting: Cardiology

## 2008-09-30 ENCOUNTER — Encounter: Payer: Self-pay | Admitting: Internal Medicine

## 2008-10-04 ENCOUNTER — Ambulatory Visit: Payer: Self-pay | Admitting: Cardiology

## 2008-10-04 LAB — CONVERTED CEMR LAB
BUN: 7 mg/dL (ref 6–23)
CO2: 33 meq/L — ABNORMAL HIGH (ref 19–32)
Chloride: 104 meq/L (ref 96–112)
Creatinine, Ser: 0.8 mg/dL (ref 0.4–1.2)
Potassium: 3.9 meq/L (ref 3.5–5.1)

## 2008-10-07 ENCOUNTER — Telehealth: Payer: Self-pay | Admitting: Cardiology

## 2008-10-11 ENCOUNTER — Encounter: Payer: Self-pay | Admitting: Family Medicine

## 2008-10-12 ENCOUNTER — Encounter: Payer: Self-pay | Admitting: Cardiology

## 2008-10-14 ENCOUNTER — Telehealth: Payer: Self-pay | Admitting: Cardiology

## 2008-10-14 ENCOUNTER — Telehealth (INDEPENDENT_AMBULATORY_CARE_PROVIDER_SITE_OTHER): Payer: Self-pay | Admitting: *Deleted

## 2008-10-18 ENCOUNTER — Telehealth: Payer: Self-pay | Admitting: Cardiology

## 2008-10-18 ENCOUNTER — Encounter: Payer: Self-pay | Admitting: Family Medicine

## 2008-10-19 ENCOUNTER — Telehealth: Payer: Self-pay | Admitting: Internal Medicine

## 2008-10-25 ENCOUNTER — Telehealth: Payer: Self-pay | Admitting: Cardiology

## 2008-10-27 ENCOUNTER — Ambulatory Visit: Payer: Self-pay | Admitting: Family Medicine

## 2008-10-27 ENCOUNTER — Encounter: Payer: Self-pay | Admitting: Family Medicine

## 2008-10-28 ENCOUNTER — Encounter (INDEPENDENT_AMBULATORY_CARE_PROVIDER_SITE_OTHER): Payer: Self-pay | Admitting: *Deleted

## 2008-10-28 LAB — CONVERTED CEMR LAB
BUN: 13 mg/dL (ref 6–23)
CO2: 28 meq/L (ref 19–32)
Calcium: 9.5 mg/dL (ref 8.4–10.5)
Glucose, Bld: 83 mg/dL (ref 70–99)

## 2008-11-01 ENCOUNTER — Telehealth: Payer: Self-pay | Admitting: Cardiology

## 2008-11-01 ENCOUNTER — Telehealth: Payer: Self-pay | Admitting: Family Medicine

## 2008-11-05 ENCOUNTER — Encounter: Payer: Self-pay | Admitting: Internal Medicine

## 2008-11-11 ENCOUNTER — Telehealth: Payer: Self-pay | Admitting: Cardiology

## 2008-11-16 ENCOUNTER — Encounter: Payer: Self-pay | Admitting: Internal Medicine

## 2008-11-22 ENCOUNTER — Encounter (INDEPENDENT_AMBULATORY_CARE_PROVIDER_SITE_OTHER): Payer: Self-pay | Admitting: *Deleted

## 2008-11-22 ENCOUNTER — Encounter: Payer: Self-pay | Admitting: Cardiology

## 2008-11-22 ENCOUNTER — Ambulatory Visit: Payer: Self-pay

## 2008-11-22 ENCOUNTER — Ambulatory Visit: Payer: Self-pay | Admitting: Cardiology

## 2008-12-07 ENCOUNTER — Telehealth: Payer: Self-pay | Admitting: Family Medicine

## 2008-12-08 ENCOUNTER — Ambulatory Visit: Payer: Self-pay | Admitting: Internal Medicine

## 2008-12-08 ENCOUNTER — Inpatient Hospital Stay (HOSPITAL_COMMUNITY): Admission: EM | Admit: 2008-12-08 | Discharge: 2008-12-24 | Payer: Self-pay | Admitting: Emergency Medicine

## 2008-12-08 ENCOUNTER — Ambulatory Visit: Payer: Self-pay | Admitting: Family Medicine

## 2008-12-11 ENCOUNTER — Encounter (INDEPENDENT_AMBULATORY_CARE_PROVIDER_SITE_OTHER): Payer: Self-pay | Admitting: Internal Medicine

## 2008-12-11 ENCOUNTER — Ambulatory Visit: Payer: Self-pay | Admitting: Vascular Surgery

## 2008-12-11 ENCOUNTER — Encounter: Payer: Self-pay | Admitting: Family Medicine

## 2008-12-14 ENCOUNTER — Encounter (INDEPENDENT_AMBULATORY_CARE_PROVIDER_SITE_OTHER): Payer: Self-pay | Admitting: *Deleted

## 2008-12-29 ENCOUNTER — Telehealth (INDEPENDENT_AMBULATORY_CARE_PROVIDER_SITE_OTHER): Payer: Self-pay | Admitting: *Deleted

## 2008-12-29 ENCOUNTER — Encounter: Payer: Self-pay | Admitting: Cardiology

## 2008-12-31 ENCOUNTER — Encounter: Payer: Self-pay | Admitting: Internal Medicine

## 2008-12-31 ENCOUNTER — Encounter: Payer: Self-pay | Admitting: Cardiology

## 2009-01-02 ENCOUNTER — Telehealth: Payer: Self-pay | Admitting: Family Medicine

## 2009-01-04 ENCOUNTER — Telehealth: Payer: Self-pay | Admitting: Cardiology

## 2009-01-06 ENCOUNTER — Encounter: Payer: Self-pay | Admitting: Cardiology

## 2009-01-13 ENCOUNTER — Encounter: Payer: Self-pay | Admitting: Cardiology

## 2009-01-16 ENCOUNTER — Ambulatory Visit: Payer: Self-pay | Admitting: Internal Medicine

## 2009-01-16 ENCOUNTER — Ambulatory Visit: Payer: Self-pay | Admitting: Cardiology

## 2009-01-17 ENCOUNTER — Telehealth: Payer: Self-pay | Admitting: Family Medicine

## 2009-01-18 ENCOUNTER — Encounter: Payer: Self-pay | Admitting: Cardiology

## 2009-01-23 ENCOUNTER — Telehealth (INDEPENDENT_AMBULATORY_CARE_PROVIDER_SITE_OTHER): Payer: Self-pay | Admitting: *Deleted

## 2009-01-23 ENCOUNTER — Telehealth: Payer: Self-pay | Admitting: Cardiology

## 2009-01-23 LAB — CONVERTED CEMR LAB
Calcium: 9.4 mg/dL (ref 8.4–10.5)
GFR calc non Af Amer: 114.57 mL/min (ref >60)
Potassium: 3.6 meq/L (ref 3.5–5.1)
Sodium: 141 meq/L (ref 135–145)

## 2009-01-24 ENCOUNTER — Encounter: Payer: Self-pay | Admitting: Cardiology

## 2009-01-30 ENCOUNTER — Encounter: Payer: Self-pay | Admitting: Cardiology

## 2009-02-16 ENCOUNTER — Ambulatory Visit: Payer: Self-pay | Admitting: Cardiology

## 2009-02-16 LAB — CONVERTED CEMR LAB
BUN: 14 mg/dL (ref 6–23)
CO2: 33 meq/L — ABNORMAL HIGH (ref 19–32)
Calcium: 9.4 mg/dL (ref 8.4–10.5)
Chloride: 101 meq/L (ref 96–112)
Creatinine, Ser: 0.9 mg/dL (ref 0.4–1.2)
Glucose, Bld: 105 mg/dL — ABNORMAL HIGH (ref 70–99)
Potassium: 3.1 meq/L — ABNORMAL LOW (ref 3.5–5.1)
Sodium: 141 meq/L (ref 135–145)

## 2009-02-17 ENCOUNTER — Telehealth: Payer: Self-pay | Admitting: Cardiology

## 2009-02-20 ENCOUNTER — Encounter: Payer: Self-pay | Admitting: Cardiology

## 2009-02-27 ENCOUNTER — Ambulatory Visit: Payer: Self-pay | Admitting: Cardiology

## 2009-03-03 ENCOUNTER — Telehealth (INDEPENDENT_AMBULATORY_CARE_PROVIDER_SITE_OTHER): Payer: Self-pay | Admitting: *Deleted

## 2009-03-03 LAB — CONVERTED CEMR LAB
BUN: 16 mg/dL (ref 6–23)
Calcium: 9.2 mg/dL (ref 8.4–10.5)
Creatinine, Ser: 1 mg/dL (ref 0.4–1.2)
GFR calc non Af Amer: 75.88 mL/min (ref >60)
Glucose, Bld: 97 mg/dL (ref 70–99)

## 2009-03-10 ENCOUNTER — Encounter: Payer: Self-pay | Admitting: Cardiology

## 2009-03-14 ENCOUNTER — Ambulatory Visit: Payer: Self-pay | Admitting: Cardiology

## 2009-04-11 ENCOUNTER — Telehealth: Payer: Self-pay | Admitting: Family Medicine

## 2009-04-11 ENCOUNTER — Encounter: Payer: Self-pay | Admitting: Cardiology

## 2009-04-11 ENCOUNTER — Ambulatory Visit: Payer: Self-pay | Admitting: Cardiovascular Disease

## 2009-04-11 ENCOUNTER — Ambulatory Visit: Payer: Self-pay

## 2009-04-11 ENCOUNTER — Ambulatory Visit (HOSPITAL_COMMUNITY): Admission: RE | Admit: 2009-04-11 | Discharge: 2009-04-11 | Payer: Self-pay | Admitting: Cardiology

## 2009-04-25 ENCOUNTER — Ambulatory Visit: Payer: Self-pay | Admitting: Internal Medicine

## 2009-05-01 ENCOUNTER — Encounter: Payer: Self-pay | Admitting: Family Medicine

## 2009-05-01 ENCOUNTER — Ambulatory Visit: Payer: Self-pay | Admitting: Family Medicine

## 2009-05-01 LAB — CONVERTED CEMR LAB
BUN: 19 mg/dL (ref 6–23)
Calcium: 9.7 mg/dL (ref 8.4–10.5)
MCHC: 31.2 g/dL (ref 30.0–36.0)
MCV: 90.4 fL (ref 78.0–100.0)
Nitrite: NEGATIVE
Potassium: 4.2 meq/L (ref 3.5–5.3)
RDW: 13.7 % (ref 11.5–15.5)
Sodium: 139 meq/L (ref 135–145)
Specific Gravity, Urine: 1.02
WBC Urine, dipstick: NEGATIVE

## 2009-05-02 ENCOUNTER — Telehealth: Payer: Self-pay | Admitting: Family Medicine

## 2009-05-05 ENCOUNTER — Telehealth: Payer: Self-pay | Admitting: Family Medicine

## 2009-05-08 ENCOUNTER — Telehealth: Payer: Self-pay | Admitting: Family Medicine

## 2009-05-25 ENCOUNTER — Telehealth: Payer: Self-pay | Admitting: Family Medicine

## 2009-05-26 ENCOUNTER — Ambulatory Visit: Payer: Self-pay | Admitting: Family Medicine

## 2009-05-26 ENCOUNTER — Encounter: Payer: Self-pay | Admitting: Family Medicine

## 2009-05-26 LAB — CONVERTED CEMR LAB
Glucose, Bld: 90 mg/dL (ref 70–99)
Potassium: 4.6 meq/L (ref 3.5–5.3)
Sodium: 139 meq/L (ref 135–145)

## 2009-06-08 ENCOUNTER — Ambulatory Visit: Payer: Self-pay | Admitting: Cardiology

## 2009-06-09 LAB — CONVERTED CEMR LAB: Pro B Natriuretic peptide (BNP): 58 pg/mL (ref 0.0–100.0)

## 2009-06-22 ENCOUNTER — Telehealth: Payer: Self-pay | Admitting: Cardiology

## 2009-06-22 ENCOUNTER — Encounter: Payer: Self-pay | Admitting: Cardiology

## 2009-08-14 ENCOUNTER — Encounter: Payer: Self-pay | Admitting: Family Medicine

## 2009-09-07 ENCOUNTER — Encounter: Payer: Self-pay | Admitting: Family Medicine

## 2009-09-18 ENCOUNTER — Ambulatory Visit: Payer: Self-pay | Admitting: Cardiology

## 2009-09-20 ENCOUNTER — Ambulatory Visit: Payer: Self-pay | Admitting: Cardiology

## 2009-09-22 ENCOUNTER — Telehealth: Payer: Self-pay | Admitting: Cardiology

## 2009-09-25 LAB — CONVERTED CEMR LAB
CO2: 29 meq/L (ref 19–32)
Chloride: 108 meq/L (ref 96–112)
Creatinine, Ser: 0.8 mg/dL (ref 0.4–1.2)
Potassium: 4.2 meq/L (ref 3.5–5.1)

## 2009-09-28 ENCOUNTER — Inpatient Hospital Stay (HOSPITAL_BASED_OUTPATIENT_CLINIC_OR_DEPARTMENT_OTHER): Admission: RE | Admit: 2009-09-28 | Discharge: 2009-09-28 | Payer: Self-pay | Admitting: Cardiology

## 2009-09-28 ENCOUNTER — Ambulatory Visit: Payer: Self-pay | Admitting: Cardiology

## 2009-10-05 ENCOUNTER — Telehealth: Payer: Self-pay | Admitting: Family Medicine

## 2009-10-20 ENCOUNTER — Telehealth: Payer: Self-pay | Admitting: *Deleted

## 2009-10-25 ENCOUNTER — Ambulatory Visit: Payer: Self-pay | Admitting: Cardiology

## 2009-10-27 ENCOUNTER — Telehealth: Payer: Self-pay | Admitting: Family Medicine

## 2009-11-01 ENCOUNTER — Telehealth: Payer: Self-pay | Admitting: Family Medicine

## 2009-11-02 ENCOUNTER — Telehealth: Payer: Self-pay | Admitting: Family Medicine

## 2009-12-01 ENCOUNTER — Encounter: Payer: Self-pay | Admitting: Cardiology

## 2009-12-08 ENCOUNTER — Ambulatory Visit: Payer: Self-pay | Admitting: Cardiology

## 2009-12-22 ENCOUNTER — Encounter (INDEPENDENT_AMBULATORY_CARE_PROVIDER_SITE_OTHER): Payer: Self-pay | Admitting: *Deleted

## 2009-12-31 ENCOUNTER — Encounter: Payer: Self-pay | Admitting: Family Medicine

## 2010-01-08 ENCOUNTER — Encounter: Payer: Self-pay | Admitting: Family Medicine

## 2010-01-23 ENCOUNTER — Ambulatory Visit: Payer: Self-pay | Admitting: Cardiology

## 2010-01-24 LAB — CONVERTED CEMR LAB
ALT: 9 units/L (ref 0–35)
AST: 13 units/L (ref 0–37)
Albumin: 4 g/dL (ref 3.5–5.2)
Alkaline Phosphatase: 57 units/L (ref 39–117)
BUN: 23 mg/dL (ref 6–23)
Bilirubin, Direct: 0.2 mg/dL (ref 0.0–0.3)
CO2: 30 meq/L (ref 19–32)
Chloride: 106 meq/L (ref 96–112)
GFR calc non Af Amer: 100.7 mL/min (ref >60)
Glucose, Bld: 69 mg/dL — ABNORMAL LOW (ref 70–99)
Potassium: 4.5 meq/L (ref 3.5–5.1)
Sodium: 143 meq/L (ref 135–145)
Total Protein: 7.7 g/dL (ref 6.0–8.3)

## 2010-02-08 ENCOUNTER — Encounter: Payer: Self-pay | Admitting: Family Medicine

## 2010-02-14 ENCOUNTER — Ambulatory Visit: Payer: Self-pay | Admitting: Family Medicine

## 2010-02-14 DIAGNOSIS — J069 Acute upper respiratory infection, unspecified: Secondary | ICD-10-CM | POA: Insufficient documentation

## 2010-02-16 ENCOUNTER — Telehealth: Payer: Self-pay | Admitting: Family Medicine

## 2010-02-28 ENCOUNTER — Telehealth (INDEPENDENT_AMBULATORY_CARE_PROVIDER_SITE_OTHER): Payer: Self-pay | Admitting: *Deleted

## 2010-03-05 ENCOUNTER — Encounter: Payer: Self-pay | Admitting: Family Medicine

## 2010-03-07 ENCOUNTER — Ambulatory Visit: Payer: Self-pay | Admitting: Internal Medicine

## 2010-03-14 ENCOUNTER — Telehealth: Payer: Self-pay | Admitting: Cardiology

## 2010-03-16 ENCOUNTER — Ambulatory Visit (HOSPITAL_COMMUNITY)
Admission: RE | Admit: 2010-03-16 | Discharge: 2010-03-16 | Payer: Self-pay | Source: Home / Self Care | Attending: Family Medicine | Admitting: Family Medicine

## 2010-03-21 ENCOUNTER — Encounter: Payer: Self-pay | Admitting: Cardiology

## 2010-03-23 ENCOUNTER — Ambulatory Visit: Payer: Self-pay

## 2010-03-26 ENCOUNTER — Telehealth: Payer: Self-pay | Admitting: Cardiology

## 2010-04-11 ENCOUNTER — Ambulatory Visit
Admission: RE | Admit: 2010-04-11 | Discharge: 2010-04-11 | Payer: Self-pay | Source: Home / Self Care | Attending: Family Medicine | Admitting: Family Medicine

## 2010-04-11 DIAGNOSIS — R300 Dysuria: Secondary | ICD-10-CM | POA: Insufficient documentation

## 2010-04-11 DIAGNOSIS — M545 Low back pain: Secondary | ICD-10-CM | POA: Insufficient documentation

## 2010-04-11 LAB — CONVERTED CEMR LAB
Bilirubin Urine: NEGATIVE
Blood in Urine, dipstick: NEGATIVE
Protein, U semiquant: NEGATIVE
Urobilinogen, UA: 1

## 2010-04-18 ENCOUNTER — Telehealth: Payer: Self-pay | Admitting: Cardiology

## 2010-04-23 ENCOUNTER — Ambulatory Visit
Admission: RE | Admit: 2010-04-23 | Discharge: 2010-04-23 | Payer: Self-pay | Source: Home / Self Care | Attending: Cardiology | Admitting: Cardiology

## 2010-04-23 ENCOUNTER — Encounter: Payer: Self-pay | Admitting: Cardiology

## 2010-04-23 ENCOUNTER — Other Ambulatory Visit: Payer: Self-pay | Admitting: Cardiology

## 2010-04-23 LAB — BASIC METABOLIC PANEL
BUN: 22 mg/dL (ref 6–23)
CO2: 29 mEq/L (ref 19–32)
Calcium: 9.5 mg/dL (ref 8.4–10.5)
Chloride: 101 mEq/L (ref 96–112)
Creatinine, Ser: 0.8 mg/dL (ref 0.4–1.2)
GFR: 103.66 mL/min (ref >60.00)
Glucose, Bld: 71 mg/dL (ref 70–99)
Potassium: 4.2 mEq/L (ref 3.5–5.1)
Sodium: 138 mEq/L (ref 135–145)

## 2010-04-23 LAB — BRAIN NATRIURETIC PEPTIDE: Pro B Natriuretic peptide (BNP): 54.4 pg/mL (ref 0.0–100.0)

## 2010-04-26 ENCOUNTER — Telehealth: Payer: Self-pay | Admitting: Cardiology

## 2010-04-29 ENCOUNTER — Encounter: Payer: Self-pay | Admitting: Emergency Medicine

## 2010-04-30 ENCOUNTER — Ambulatory Visit: Admission: RE | Admit: 2010-04-30 | Discharge: 2010-04-30 | Payer: Self-pay | Source: Home / Self Care

## 2010-04-30 ENCOUNTER — Ambulatory Visit (HOSPITAL_COMMUNITY)
Admission: RE | Admit: 2010-04-30 | Discharge: 2010-04-30 | Payer: Self-pay | Source: Home / Self Care | Attending: Cardiology | Admitting: Cardiology

## 2010-05-08 NOTE — Letter (Signed)
Summary: [facility]  [facility] Patient [facility]   Imported By: Marilynne Drivers 12/20/2009 15:40:48  _____________________________________________________________________  External Attachment:    Type:   Image     Comment:   External Document

## 2010-05-08 NOTE — Cardiovascular Report (Signed)
Summary: Office Visit   Office Visit   Imported By: Sallee Provencal 05/01/2009 13:24:42  _____________________________________________________________________  External Attachment:    Type:   Image     Comment:   External Document

## 2010-05-08 NOTE — Assessment & Plan Note (Signed)
Summary: Hospital H&P   Primary Care Provider:  Danise Mina MD   History of Present Illness: Rachel [name] is a 50 year old female with stage IV pulmonary sarcoidosis presenting with respiratory distress. She was recently discharged from [facility] on 08/30/2008 after a CHF exacerbation. During that stay, she had a cardiac cath that showed EF 55% with normal wall motion and normal coronary arteries. She does have moderate to severe pulmonary HTN. She was treated with Lasix, diuresed 6 , and was at her dry weight of 75 kg prior to DC. She was not discharged with Lasix. Since then, she has noticed increased swelling in her legs and difficulty breathing. Upon presentation to the ED tonight, she was found to be tachycardic, tachypnic, with O2 sat 72% on 5LNC. CXR shows moderate CHF superimposed on severe pulmonary fibrosis with bilateral pleural effusions. She was placed on Bipap and is now sating mid 90s with RR 24-44. She was also given her first dose of Lasix in the ED.   The patient endorses fever/chills, CP described as "tightness," orthopnea, cough, nausea, dizziness, lower extremity swelling. She denies HA, vomiting, weight gain, abdominal pain, lower extremity pain.  Medications Prior to Update: 1)  Albuterol 90 Mcg/act Aers (Albuterol) .... Inhale 2 Puff Using Inhaler Every 4-6 Hours Needed Shortnes of Breath.  You Have Permission To Refill Early. 2)  Coreg 3.125 Mg Tabs (Carvedilol) .... Take 1 By Mouth Two Times A Day 3)  Nasonex 50 Mcg/act Susp (Mometasone Furoate) .... Spray 2 Spray Into Both Nostrils Once A Day As Needed 4)  Nexium 40 Mg Cpdr (Esomeprazole Magnesium) .... Take 1 Capsule By Mouth Every Morning 5)  Risperdal 0.25 Mg Tabs (Risperidone) .... Take 1 By Mouth At Bedtime 6)  Flovent Hfa 110 Mcg/act  Aero (Fluticasone Propionate  Hfa) .... Take One To Two Puffs Two Times A Day 7)  Ibuprofen 600 Mg Tabs (Ibuprofen) .Marland Kitchen.. 1 By Mouth Every 6 Hours As Needed 8)  Albuterol Sulfate (2.5  Mg/23m) 0.083% Nebu (Albuterol Sulfate) .... Inhale With Nebulizer Machine Q 4 Hours If Still Having Shortness of Breath With Inhallers. 9)  Astepro 0.15 % Soln (Azelastine Hcl) ..Marland Kitchen. 1-2 Sprays Each Nostril Up To Twice Daily If Needed 10)  Flonase 50 Mcg/act Susp (Fluticasone Propionate) .... 2 Sprays in Each Nostril 11)  Oxygen .... 5-6 Liters 12)  Revatio 20 Mg Tabs (Sildenafil Citrate) .... Take 1 By Mouth Three Times A Day  Allergies (verified): 1)  Demerol 2)  Ultram 3)  Doxycycline  Past History:  Past Medical History: Last updated: 09/02/2008 Allergic rhinitis,  BL Cr:1.0,  GERD,  H/o chest wall pain near defibrillator,  H/o fractured L arm/hand (when?); Fx L foot (2002),  h/o iron deficiency anemia.,  H/o secondary amenorrhea/irregular menses,  Hosp. Admision 9/25-27 for CHF exacerbation,  Hosp. 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 Daughter has new baby now.   Risk Factors: Smoking Status: quit (07/09/2007)  Past Surgical History: Cardiac Catheterization: 55% EF with normal coronary arteries and normal wall motion - 08/26/08 Adenosine Cardiolyte: no ischemia/infarct; marked global LV hypokinesis; Resting EF 22% - 03/31/2003 Cardiac Cath: Globally depressed LV fxn w/ EF 20%; idiopathic dilated cardiomyopathy (?d/t sarcoid?) - 03/31/2003, Cardiac MR: EF 40%; no discrete e/o LV myocardial sarcoid involvement; study ended early d/t chest pain - 03/31/2003 PFTs ([name]): FVC 2000 (56%), FEV1 1400 (47%), ratio 0.7; insignif response to bronchodilator; stable from 1/05 - 12/08/2003, PFTs: mod restriction, FVC 1.9L, FEV1 1.6L, both 53% predicted; slt response to bronchodilators - 04/08/2002 St. Jude single chamber defibrillator placed (Dr. Cristopher Peru) - 03/31/2003  Review of Systems       per HPI, otherwise negative  Physical Exam  General:  Vitals: T BP 122/82 P 115 RR 46 02 96% Bipapalert, well-developed, well-nourished, well-hydrated, and anxious appearing on Bipap.   Head:  normocephalic and atraumatic.   Eyes:  vision grossly intact, pupils equal, pupils round, and pupils reactive to light.   Ears:  R ear normal and L ear normal.   Nose:  no external deformity.   Mouth:  pharynx pink and moist.   Neck:  supple, no masses, and no JVD.   Lungs:  occasional crackles; supraclavicular retractions. no wheezing. wearing Bipap. Heart:  tachycardic. regular rhythm.  Abdomen:  soft, NT, ND, +BS Pulses:  2+ peripheral pulses Extremities:  trace peripheral edema to mid calves Neurologic:  CN II-XII grossly intact; nonfocal Skin:  no rashes Psych:  +anxious.  Additional Exam:  CXR: mod CHF superimposed upon severe interstitial pulmonary fibrosis and bilateral pleural effusions. LABS: BNP 501, BMP WNL, POC CE neg, WBC 10.9, Hgb 11.7, platelets 265.  7.35/49/117 (Bipap) EKG: Sinus tachycardia, no ST  changes   Impression & Recommendations:  Problem # 1:  Hosp for DYSPNEA (ICD-786.05) Patient with respiratory distress. Will admit to telemetry bed and continue Bipap per RT with instructions to wean with parameters to keep O2 86-92%. We will diurese with Lasix 80 IV two times a day with parameters to hold if SBP <100. We will add Solumedrol to the regimen to treat the sarcoidosis/acute inflammation component. As the patient endorses fever/chills and cough, we will cover HCAP. Ms. Olejnik also endorses CP. We will cycle CE x 3 (acute ischemia low likelihood as patient had clean cath x 1 week ago), obtain CTangio to evaluate for PE.  Problem # 2:  PULMONARY SARCOIDOSIS (ICD-135) Patient's Pulmonologist is [name].  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   Problem # 4:  ANXIETY (ICD-300.00) Patient with significant anxiety regarding her breathing. States that she does not tolerate many medications and does not want anything that will make her "loopy."  Problem # 5:  FEN/GI NPO while on Bipap, monitor electrolytes.  Problem # 6:  PPX PPI for PUD, heparin for DVT  Problem # 7:  DISPO Pending improvement.  Complete Medication List: 1)  Albuterol 90 Mcg/act Aers (Albuterol) .Marland KitchenMarland KitchenMarland Kitchen  Inhale 2 puff using inhaler every 4-6 hours needed shortnes of breath.  you have permission to refill early. 2)  Coreg 3.125 Mg Tabs (Carvedilol) .... Take 1 by mouth two times a day 3)  Nasonex 50 Mcg/act Susp (Mometasone furoate) .... Spray 2 spray into both nostrils once a day as needed 4)  Nexium 40 Mg Cpdr (Esomeprazole magnesium) .... Take 1 capsule by mouth every morning 5)  Risperdal 0.25 Mg Tabs (Risperidone) .... Take 1 by mouth at bedtime 6)  Flovent Hfa 110 Mcg/act Aero (Fluticasone propionate  hfa) .... Take one to two puffs two times a day 7)  Ibuprofen 600 Mg Tabs (Ibuprofen) .Marland Kitchen.. 1 by mouth every 6 hours as needed 8)  Albuterol Sulfate (2.5  Mg/26m) 0.083% Nebu (Albuterol sulfate) .... Inhale with nebulizer machine q 4 hours if still having shortness of breath with inhallers. 9)  Astepro 0.15 % Soln (Azelastine hcl) ..Marland Kitchen. 1-2 sprays each nostril up to twice daily if needed 10)  Flonase 50 Mcg/act Susp (Fluticasone propionate) .... 2 sprays in each nostril 11)  Oxygen  .... 5-6 liters 12)  Revatio 20 Mg Tabs (Sildenafil citrate) .... Take 1 by mouth three times a day  Appended Document: Orders Update Medications Added ATIVAN 1 MG  TABS (LORAZEPAM) take 1/2 to 1 rab by mouth at hs as needed anxiety          Clinical Lists Changes  Medications: Added new medication of ATIVAN 1 MG  TABS (LORAZEPAM) take 1/2 to 1 rab by mouth at hs as needed anxiety - Signed Rx of ATIVAN 1 MG  TABS (LORAZEPAM) take 1/2 to 1 rab by mouth at hs as needed anxiety;  #30 x 0;  Signed;  Entered by: EBriscoe DeutscherMD;  Authorized by: EBriscoe DeutscherMD;  Method used: Printed then faxed to [id number] [address] [place]  [address] Ph: [phone] Fax: [phone] of ATIVAN 1 MG  TABS (LORAZEPAM) take 1/2 to 1 rab by mouth at hs as needed anxiety;  #30 x 0;  Signed;  Entered by: EBriscoe DeutscherMD;  Authorized by: EBriscoe DeutscherMD;  Method used: Printed then faxed to [id number] [address], [place]  [address] Ph: [phone] Fax: [phone]   Prescriptions: ATIVAN 1 MG  TABS (LORAZEPAM) take 1/2 to 1 rab by mouth at hs as needed anxiety  #30 x 0   Entered and Authorized by:   EBriscoe DeutscherMD   Signed by:   EBriscoe DeutscherMD on 09/20/2008   Method used:   Printed then faxed to ...       [id number] (retail)       [address] [place]  [address]      Ph: [phone]      Fax: [phone]  RxID::   [id number] 1 MG  TABS (LORAZEPAM) take 1/2 to 1 rab by mouth at hs as needed anxiety  #30 x 0    Entered and Authorized by:   EBriscoe DeutscherMD   Signed by:   EBriscoe DeutscherMD on 09/20/2008   Method used:   Printed then faxed to ...       [id number] (retail)       [address]  [place], [place]  [address]       Ph: [phone]       Fax: [phone]   RxID:   [id number]  ONLY 1 RX SENT. 09/30/08.

## 2010-05-08 NOTE — Progress Notes (Signed)
Summary: refill  Phone Note Refill Request Call back at Home Phone [phone] Message from:  Patient  Refills Requested: Medication #1:  FLOVENT HFA 110 MCG/ACT  AERO Take one to two puffs two times a day   Supply Requested: 3 months  Medication #2:  NEXIUM 40 MG CPDR one tab by mouth each morning for heartburn / reflux   Supply Requested: 3 months  Medication #3:  POTASSIUM CHLORIDE CRYS CR 20 MEQ CR-TABS take two tabs in am and 1 tab in the pm   Supply Requested: 3 months  Medication #4:  COREG 3.125 MG TABS 1 two times a day   Supply Requested: 3 months Risperdal [facility](states she gave info to Jefferson and he wrote it on the yellow sheet) pls let pt know this has been taken care of  Initial call taken by: Audie Clear,  February 16, 2010 11:43 AM  Follow-up for Phone Call        [phone]- doctors line Refilled via phone please let pt know Follow-up by: Vic Blackbird MD,  February 16, 2010 5:12 PM    Prescriptions: NEXIUM 40 MG CPDR (ESOMEPRAZOLE MAGNESIUM) one tab by mouth each morning for heartburn / reflux  #90 x 1   Entered and Authorized by:   Vic Blackbird MD   Signed by:   Vic Blackbird MD on 02/16/2010   Method used:   Telephoned to ...       [facility] (mail-order)             , [place]         Ph: [phone]       Fax: [phone]   RxID:   [id number] POTASSIUM CHLORIDE CRYS CR 20 MEQ CR-TABS (POTASSIUM CHLORIDE CRYS CR) take two tabs in am and 1 tab in the pm  #270 x 1   Entered and Authorized by:   Vic Blackbird MD   Signed by:   Vic Blackbird MD on 02/16/2010   Method used:   Telephoned to ...       [address] [facility] (mail-order)             , [place]         Ph: [phone]       Fax: [phone]   RxID:   [id number] COREG 3.125 MG TABS (CARVEDILOL) 1 two times a day  #180 x 1   Entered and Authorized by:   Vic Blackbird MD   Signed by:   Vic Blackbird MD on 02/16/2010   Method used:   Telephoned to ...       [facility] (mail-order)          , [place]         Ph: [phone]       Fax: [phone]   RxID:   [id number] FLOVENT HFA 110 MCG/ACT  AERO (FLUTICASONE PROPIONATE  HFA) Take one to two puffs two times a day  #3 x 3   Entered and Authorized by:   Vic Blackbird MD   Signed by:   Vic Blackbird MD on 02/16/2010   Method used:   Telephoned to ...       [facility] (mail-order)             , [place]         Ph: [phone]       Fax: [phone]   RxID:   [id number] RISPERDAL 0.5 MG TABS (RISPERIDONE) 1 by mouth at bedtime  #90 x 1  Entered and Authorized by:   Vic Blackbird MD   Signed by:   Vic Blackbird MD on 02/16/2010   Method used:   Telephoned to ...       [facility] (mail-order)             , [place]         Ph: [phone]       Fax: [phone]   RxID:   [id number]

## 2010-05-08 NOTE — Consult Note (Signed)
Summary: [place] Adm/dc  [facility]   Imported By: Tobin Chad 03/08/2010 11:10:14  _____________________________________________________________________  External Attachment:    Type:   Image     Comment:   External Document

## 2010-05-08 NOTE — Cardiovascular Report (Signed)
Summary: Pre Cath Orders   Pre Cath Orders   Imported By: Sallee Provencal 09/28/2009 10:19:26  _____________________________________________________________________  External Attachment:    Type:   Image     Comment:   External Document

## 2010-05-08 NOTE — Miscellaneous (Signed)
  Clinical Lists Changes  Problems: Removed problem of CHF - EJECTION FRACTION < 50% (ICD-428.22) Changed problem from CHF (ICD-428.0) to CONGESTIVE HEART FAILURE, RIGHT (ICD-428.0)

## 2010-05-08 NOTE — Assessment & Plan Note (Signed)
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 She is now on Revatio 80 mg tid.  She feels like this has been helping her.  She has doubled her 6 minute walk from 200 ft to 400 ft.  She is more active.  She continues to use 5-6 L/min oxygen.  She has been evaluated for heart/lung transplant at [facility] as an inpatient and needs to raise around $10,000 to continue evaluation.  Her church is helping her with fundraising.  Also of note, her ICD is nearing ERI.  Given her end stage lung disease and normalized LV function, it has been decided to turn off tachy therapies and not place a new.  Patient is getting over a recent gastroenteritis.   Labs (11/10): BNP 91, creatinine 1.0, K 4 Labs (2/11): K 4.6, creatinine 0.86 Labs (3/11): BNP 58  Current Medications (verified): 1)  Albuterol 90 Mcg/act Aers (Albuterol) .... Inhale 2 Puff Using Inhaler Every 4-6 Hours Needed Shortnes of Breath.  You Have Permission To Refill Early. 2)  Risperdal 0.5 Mg Tabs (Risperidone) .Marland Kitchen.. 1 By Mouth At Bedtime 3)  Flovent Hfa 110 Mcg/act  Aero (Fluticasone Propionate  Hfa) .... Take One To Two Puffs Two Times A Day 4)  Oxygen .... 5-6 Liters 5)  Revatio 20 Mg Tabs (Sildenafil Citrate) .... Take 4  Tablets Every 8 Hours 6)  Lasix 40 Mg Tabs (Furosemide) .Marland Kitchen.. 1every Other Day Daily 7)  Tylenol 325 Mg Tabs (Acetaminophen) .... As Needed 8)  Coreg 3.125 Mg Tabs (Carvedilol) .Marland Kitchen.. 1 Two Times A Day 9)  Potassium Chloride Crys Cr 20 Meq Cr-Tabs (Potassium Chloride Crys Cr) .... Take Two Tabs in Am and 1 Tab in The Pm 10)  Aspirin 81 Mg  Tabs (Aspirin) .Marland Kitchen.. 1 Tab Once Daily  Allergies (verified): 1)  ! * Steroids High Dose 2)  Demerol 3)  Ultram 4)  Darvocet-N 100 5)  Doxycycline  Past History:  Past Medical History: 1.  Sarcoidosis:  Stage  IV pulmonary sarcoid on home O2.  She has been referred to [facility] for lung transplant evaluation.  2.  Cardiomyopathy:  Cardiac MRI in 2004 with no evidence for infiltrative disease but EF 40%.  Echo (9/10): EF 60%, severely dilated RV with moderately decreased systolic function, moderate RAE, PASP 83 mmHg. 3.  Pulmonary hypertension with RV failure:  Moderate to severe, likely secondary to sarcoidosis and parenchymal lung disease/hypoxic pulmonary vasoconstriction.  RHC 5/10 showed PA 67/30 (mean 46) with mean PCWP 6 mmHg. Patient started on Revatio in 5/10.  6 minute walk 7/10: 200 feet.  Echo (9/10): EF 60%, severely dilated RV with moderately decreased systolic function, moderate RAE, PASP 83 mmHg. RHC (9/10) with mean RA 15, PA 74/36, CI 2.8.  Repeat RHC after diuresis with mean RA 3, PA 60/22, mean PCWP 4.  Echo (1/11): EF 37-62%, grade I diastolic dysfunction, moderately dilated RV, mild to moderate RV dysfunction, moderate to severe TR, PASP 58 mmHg.  6 minute walk (2/11): 400 feet.  4.  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  Device is now nearing ERI.  Given end stage lung disease and normalization of LV function, the device will not be replaced and tachy therapies were turned off.  5.  GERD 6.  Chronic chest pain.  LHC (5/10) with no angiographic  coronary disease.  7.  Allergic rhinitis,  8.  h/o iron deficiency anemia.  9.  H/o secondary amenorrhea/irregular menses,  10.  Psychosis with high dose steroids.   PT reports good response to AZITHROMYCIN.   11.  Possible runs of SVT  Family History: Reviewed history from 06/05/2006 and no changes required. Father-died in her 35`s due to lung cancer, Mother-`borderline Diabetes`, HTN, CHF, No family hx of breast CA or other cancers  Social History: Reviewed history from 09/21/2008 and no changes required. 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 Daughter has new baby now.   Review of Systems       All systems reviewed and negative except as per HPI.   Vital Signs:  Patient profile:   50 year old female Height:      63 inches Weight:      156 pounds BMI:     27.73 Pulse rate:   86 / minute Pulse rhythm:   regular BP sitting:   125 / 75  (left arm) Cuff size:   large  Vitals Entered By: Rise Paganini   Physical Exam  General:  Well developed, well nourished, in no acute distress. Neck:  Neck supple, JVP 8 cm. No masses, thyromegaly or abnormal cervical nodes. Lungs:  Clear bilaterally to auscultation and percussion. Heart:  Non-displaced PMI, chest non-tender; regular rate and rhythm, S1, S2 without rubs or gallops. 1/6 systolic murmur LLSB.  Loud P2.  Carotid upstroke normal, no bruit.  Pedals normal pulses. No edema, no varicosities. Abdomen:  Bowel sounds positive; abdomen soft and non-tender without masses, organomegaly, or hernias noted. No hepatosplenomegaly. Extremities:  No clubbing or cyanosis. Neurologic:  Alert and oriented x 3. Psych:  Normal affect.    ICD Specifications Following MD:  Cristopher Peru, MD     ICD Vendor:  St Jude     ICD Model Number:  [phone]     ICD Serial Number:  [id number] ICD DOI:  03/28/2003     ICD Implanting MD:  Cristopher Peru, MD  Lead 1:    Location: RV     DOI: 03/28/2003     Model #: [id number]     Serial #: [id number]     Status: active  Indications::  SARCOID   ICD Follow Up ICD Dependent:  No      Episodes Coumadin:  No  Brady Parameters Mode VVI     Lower Rate Limit:  40      Tachy Zones VF:  214     VT:  185     Impression & Recommendations:  Problem # 1:  PULMONARY HYPERTENSION (ICD-416.8) Likely due parenchymal lung disease.  Improved symptomatically on Revatio with double 6 minute walk distance.  PASP by echo down from 83 => 58 mmHg  with treatment. Continue Revatio 80 mg three times a day.   Problem # 2:  CHF (ICD-428.0) Patient's CHF seems primarily right-sided.  She has pulmonary HTN with moderate to severe RV dysfunction.  The LV systolic function was normal by echo in 1/11.  Volume status looks good.  I will have her continue her Lasix at 40 mg every other day.    Problem # 3:  PULMONARY SARCOIDOSIS (ICD-135) Patient is undergoing evaluation for heart-lung transplant at [facility].  She has to raise about $10,000 for the transplant.   Other Orders: TLB-BNP (B-Natriuretic Peptide) ([id number])  Patient Instructions: 1)  Your physician recommends that you have labs today for a BNP - 2)  Your physician recommends that you return for lab work anytime the 2 weeks prior to your 3 month follow up with Dr. Aundra Dubin 3)  Your physician recommends that you schedule a follow-up appointment in:  3 months

## 2010-05-08 NOTE — Progress Notes (Signed)
Summary: triage  Phone Note Call from Patient Call back at Home Phone [phone]   Summary of Call: Pt wants to verify that her rx's were sent into the correct pharmacy. Initial call taken by: Raymond Gurney,  May 08, 2009 4:22 PM  Follow-up for Phone Call        has [facility]med[employer] states she checked with the pharmacy last week & they had no refills. told her to call now as pcp called them in last friday. she will call & check Follow-up by: Elige Radon RN,  May 08, 2009 4:23 PM

## 2010-05-08 NOTE — Progress Notes (Signed)
Summary: RETURNED CALL  Phone Note Call from Patient Call back at [facility] Phone [phone]   Caller: Patient [phone] Reason for Call: Talk to Nurse Summary of Call: RETURNED ANNE'S CALL/MT Initial call taken by: Lorenda Hatchet,  September 22, 2009 2:12 PM  Follow-up for Phone Call        [facility] Desiree Lucy, RN, BSN  September 22, 2009 3:50 PM --talked with pt--she is aware [facility] would not be able to come to her home to check her B/P

## 2010-05-08 NOTE — Progress Notes (Signed)
Summary: Lab Res  Phone Note Call from Patient Call back at Home Phone [phone]   Caller: Patient Summary of Call: Checking on results from lab work yesterday. Initial call taken by: Raymond Gurney,  May 02, 2009 1:57 PM  Follow-up for Phone Call        will forward to MD. Follow-up by: Marcell Barlow RN,  May 02, 2009 2:20 PM  Additional Follow-up for Phone Call Additional follow up Details #1::        Pt given lab results, doing well. No problem with antibiotic.   Additional Follow-up by: Vic Blackbird MD,  May 03, 2009 12:37 PM

## 2010-05-08 NOTE — Progress Notes (Signed)
Summary: refill  Phone Note Call from Patient Call back at Home Phone [phone]   Caller: Patient Summary of Call: needs refill on nexium 15m [facility] FAXED TO [phone] NUMBER [phone] Initial call taken by: DAudie Clear  November 01, 2009 2:14 PM  Follow-up for Phone Call        Pt needs to call [employer] for her refills and they will fax a form. I have spoken with the pharmacist this is how she request refills Follow-up by: KVic BlackbirdMD,  November 01, 2009 2:21 PM

## 2010-05-08 NOTE — Letter (Signed)
Summary: [facility]   Imported By: Raymond Gurney 08/16/2009 11:17:45  _____________________________________________________________________  External Attachment:    Type:   Image     Comment:   External Document

## 2010-05-08 NOTE — Progress Notes (Signed)
Summary: refill  Phone Note Refill Request Call back at [phone] Message from:  Patient  Nexium- [place] - need until she can get it from other mail order pharm pt is needing bad  Initial call taken by: Audie Clear,  November 02, 2009 4:11 PM  Follow-up for Phone Call        Reviewed patient medications. Nexium was removed from her medication list in March 2011 (no reason stated) - called patient to advise her that this was the case. After discussion with patient, I will refill a 30 day supply today as requested. Will forward to PCP for further decision regarding this medication  Follow-up by: Mariana Arn  MD,  November 03, 2009 9:20 AM    New/Updated Medications: NEXIUM 40 MG CPDR (ESOMEPRAZOLE MAGNESIUM) one tab by mouth each morning for heartburn / reflux Prescriptions: NEXIUM 40 MG CPDR (ESOMEPRAZOLE MAGNESIUM) one tab by mouth each morning for heartburn / reflux  #30 x 0   Entered and Authorized by:   Mariana Arn  MD   Signed by:   Mariana Arn  MD on 11/03/2009   Method used:   Electronically to        [id number] (retail)       [address]       [place], [place]  [address]       Ph: [phone]       Fax: [phone]   RxID:   [id number]

## 2010-05-08 NOTE — Progress Notes (Signed)
Summary: can I take generic tylenol?  Phone Note Call from Patient   Caller: Patient Summary of Call: had question for PCP about tylenol.  Pt has CHF, pulmonary hypertension and was wondering if fine to take the tylenol for pain.  Told to take 1 pill Q 6hour as needed.  Pt wanted to know if generic is fine.  I said it would be fine.  Initial call taken by: Hulan Saas DO,  October 27, 2009 7:52 PM

## 2010-05-08 NOTE — Assessment & Plan Note (Signed)
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 She is now on Revatio 80 mg tid and has recently started on inhaled Iloprost.  She is being closely monitored by [facility] and the Iloprost dose has been increased.  Oxygen saturation has not worsened on the combination of Revatio and Iloprost.  She continues to use 5-6 L/min oxygen.  She has been evaluated for heart/lung transplant at [facility] as an inpatient and needs to raise around $10,000 to continue evaluation.  Her church is helping her with fundraising.  Also of note, her ICD is nearing ERI.  Given her end stage lung disease and normalized LV function, it has been decided to turn off tachy therapies and not place a new generator.    Initially after starting Iloprost, patient would get lightheaded/dizzy and would get chest pain with doses of Iloprost.  Now this is getting better.  She feels like her breathing has improved.  She is walking better and has been outdoors going around the block.  6 minute walk today was 158 meters (not much different than the last), but I am not sure that the test was done correctly.    Labs (11/10): BNP 91, creatinine 1.0, K 4 Labs (2/11): K 4.6, creatinine 0.86 Labs (3/11): BNP 58, K 4.2, creatinine 0.8 Labs (10/11): K 4.5, creatinine 0.8, BNP 76  Current Medications (verified): 1)  Albuterol 90 Mcg/act Aers (Albuterol) .... Inhale 2 Puff Using Inhaler Every 4-6 Hours Needed Shortnes of Breath.  You Have Permission To Refill Early. 2)  Risperdal 0.5 Mg Tabs (Risperidone) .Marland Kitchen.. 1 By Mouth At Bedtime 3)  Flovent Hfa 110 Mcg/act  Aero (Fluticasone Propionate  Hfa) .... Take One To Two Puffs Two Times A Day 4)  Oxygen .... 5-6 Liters 5)  Revatio 20 Mg Tabs (Sildenafil Citrate) .... Take 4  Tablets Every 8 Hours 6)  Lasix 40  Mg Tabs (Furosemide) .Marland Kitchen.. 1every Other Day Daily 7)  Tylenol 325 Mg Tabs (Acetaminophen) .... As Needed 8)  Coreg 3.125 Mg Tabs (Carvedilol) .Marland Kitchen.. 1 Two Times A Day 9)  Potassium Chloride Crys Cr 20 Meq Cr-Tabs (Potassium Chloride Crys Cr) .... Take Two Tabs in Am and 1 Tab in The Pm 10)  Aspirin 81 Mg  Tabs (Aspirin) .Marland Kitchen.. 1 Tab Once Daily 11)  Nexium 40 Mg Cpdr (Esomeprazole Magnesium) .... One Tab By Mouth Each Morning For Heartburn / Reflux 12)  Ventavis 20 Mcg/ml Soln (Iloprost) .... Uad  Allergies: 1)  ! * Steroids High Dose 2)  Demerol 3)  Ultram 4)  Darvocet-N 100 5)  Doxycycline  Past History:  Past Medical History: 1.  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  She has been referred to [facility] for lung transplant evaluation.  2.  Cardiomyopathy:  Cardiac MRI in 2004 with no evidence for infiltrative disease but EF 40%.  Echo (9/10): EF 60%, severely dilated RV with moderately decreased systolic function, moderate RAE, PASP 83 mmHg. 3.  Pulmonary hypertension with RV failure:  Moderate to severe, likely secondary to sarcoidosis and parenchymal lung disease/hypoxic pulmonary vasoconstriction.  RHC 5/10 showed PA 67/30 (mean 46) with mean PCWP 6 mmHg. Patient started on Revatio in 5/10 without any change in oxygen saturation.  6 minute walk 7/10: 61 m.  Echo (9/10): EF 60%, severely  dilated RV with moderately decreased systolic function, moderate RAE, PASP 83 mmHg. RHC (9/10) with mean RA 15, PA 74/36, CI 2.8.  Repeat RHC after diuresis with mean RA 3, PA 60/22, mean PCWP 4.  Echo (1/11): EF 33-38%, grade I diastolic dysfunction, moderately dilated RV, mild to moderate RV dysfunction, moderate to severe TR, PASP 58 mmHg.  6 minute walk (2/11): 122 m.  6 min walk (6/11): 161.5 m.  RHC (6/11): mean RA 11, RV 64/15, PA 63/28 mean 43, mean PCWP 14, CI 2.4 thermodilution and 3.2 Fick, PVR 5.3 WU Fick and 7.25 WU thermodilution.  Iloprost begun.  6 min walk (10/11): 158 m.  4.  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  Device is now nearing ERI.  Given end stage lung disease and normalization of LV function, the device will not be replaced and tachy therapies were turned off.  5.  GERD 6.  Chronic chest pain.  LHC (5/10) with no angiographic coronary disease.  7.  Allergic rhinitis,  8.  h/o iron deficiency anemia.  9.  H/o secondary amenorrhea/irregular menses,  10.  Psychosis with high dose steroids.  11.  Possible runs of SVT  Family History: Reviewed history from 06/05/2006 and no changes required. Father-died in her 7`s due to lung cancer, Mother-`borderline Diabetes`, HTN, CHF, No family hx of breast CA or other cancers  Social History: Reviewed history from 09/20/2009 and no changes required. 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   Review of Systems       All systems reviewed and negative except as per HPI.   Vital Signs:  Patient profile:   50 year old female Height:      63 inches Weight:      170 pounds BMI:     30.22 Pulse rate:   72 / minute BP sitting:   100 / 70  (left arm)  Vitals Entered By: Margaretmary Bayley CMA (January 23, 2010 10:57 AM)  Physical Exam  General:  Well developed, well nourished, in no acute distress. Neck:  Neck supple, JVP 7-8 cm. No masses, thyromegaly or abnormal cervical nodes. Lungs:  Clear bilaterally to auscultation and percussion. Heart:  Non-displaced PMI, chest non-tender; regular rate and rhythm, S1, S2 without rubs or gallops. 1/6 systolic murmur LLSB.  Loud P2.  Carotid upstroke normal, no bruit.  Pedals normal pulses. No edema.  Abdomen:  Bowel sounds positive; abdomen soft and non-tender without masses, organomegaly, or hernias noted. No hepatosplenomegaly. Extremities:  No clubbing  or cyanosis. Neurologic:  Alert and oriented x 3. Psych:  Normal affect.    ICD Specifications Following MD:  Cristopher Peru, MD     ICD Vendor:  [facility] Jude     ICD Model Number:  [id number]     ICD Serial Number:  [id number] ICD DOI:  03/28/2003     ICD Implanting MD:  Cristopher Peru, MD  Lead 1:    Location: RV     DOI: 03/28/2003     Model #: [id number]     Serial #: [id number]     Status: active  Indications::  SARCOID   ICD Follow Up ICD Dependent:  No      Episodes Coumadin:  No  Brady Parameters Mode VVI  Lower Rate Limit:  40      Tachy Zones VF:  214     VT:  185     Impression & Recommendations:  Problem # 1:  PULMONARY HYPERTENSION (ICD-416.8) Likely due parenchymal lung disease.  Iloprost was recently started and uptitrated.  Given her parenchymal lung disease, it should target ventilated segments of the lung and limit greater V/Q mismatching.  She feels less short of breath with exertion now that she has started Iloprost.  However, her 6 minute walk was not increased.  I am not sure, however, that the walk was done accurately.  Continue Revatio and Iloprost.  Followup 3 months.   Problem # 2:  PULMONARY SARCOIDOSIS (ICD-135) I will have patient see [name] (has not seen him in a while) for evaluation of her sarcoid.   Other Orders: EKG w/ Interpretation (93000) TLB-BMP (Basic Metabolic Panel-BMET) (12248-GNOIBBC) TLB-BNP (B-Natriuretic Peptide) (83880-BNPR) TLB-Hepatic/Liver Function Pnl (80076-HEPATIC)  Patient Instructions: 1)  Your physician recommends that you schedule a follow-up appointment in: 3month 2)  Your physician recommends that you return for lab work iWU:GQBVQ--XIHWBNP LFT 3)  You have been referred to--PLEASE SCHED TO SEE DR CHowell RucksBEEN PT THERE BEFORE

## 2010-05-08 NOTE — Assessment & Plan Note (Signed)
Summary: vomiting & diarrhea x 4 days/[place]/   Vital Signs:  Patient profile:   50 year old female Height:      63 inches Weight:      150.1 pounds BMI:     26.69 Temp:     97.8 degrees F oral Pulse rate:   82 / minute BP sitting:   113 / 75  (left arm) Cuff size:   regular  Vitals Entered By: Isabelle Course (May 26, 2009 1:39 PM) CC: C/O vomitting and diarrhea X 2 days Is Patient Diabetic? No Pain Assessment Patient in pain? no        Primary Care Provider:  Vic Blackbird MD  CC:  C/O vomitting and diarrhea X 2 days.  History of Present Illness:    Pt presents with abdominal discomfort and N/V and loose stools  Monday began feeling sick, has +sick contacts with daughter and Grandaughter with emesis and diarrhea x 2-3 days. Monday had emesis NB/NB  and loose stools approx 3-4 x , tuesday also has 1 episode of emesis and loose stools. Denies blood in stools, Wed symptoms began to resolve and yesterday pt felt fine and ate regular food, incuding dairy products. Pt did note she did not take her lasix or bp meds and held her potassium during this time. She resumed all meds yesterday.  White able to eat still feels a full feeling in her mid-epigasgtrium, admits to burining senation up to throat and occasional nausea. But still tolerating her regular meals. Has been taking nexium as prescribed  Habits & Providers  Alcohol-Tobacco-Diet     Tobacco Status: never  Current Medications (verified): 1)  Albuterol 90 Mcg/act Aers (Albuterol) .... Inhale 2 Puff Using Inhaler Every 4-6 Hours Needed Shortnes of Breath.  You Have Permission To Refill Early. 2)  Nexium 40 Mg Cpdr (Esomeprazole Magnesium) .... Take 1 Capsule By Mouth Every Morning 3)  Risperdal 0.5 Mg Tabs (Risperidone) .Marland Kitchen.. 1 By Mouth At Bedtime 4)  Flovent Hfa 110 Mcg/act  Aero (Fluticasone Propionate  Hfa) .... Take One To Two Puffs Two Times A Day 5)  Oxygen .... 5-6 Liters 6)  Revatio 20 Mg Tabs (Sildenafil Citrate)  .... Take 4  Tablets Every 8 Hours 7)  Lasix 40 Mg Tabs (Furosemide) .Marland Kitchen.. 1every Other Day Daily 8)  Tylenol 325 Mg Tabs (Acetaminophen) .... As Needed 9)  Coreg 3.125 Mg Tabs (Carvedilol) .Marland Kitchen.. 1 Two Times A Day 10)  Potassium Chloride Crys Cr 20 Meq Cr-Tabs (Potassium Chloride Crys Cr) .... Take Two Tabs in Am and 1 Tab in The Pm 11)  Aspirin 81 Mg  Tabs (Aspirin) .Marland Kitchen.. 1 Tab Once Daily 12)  Azithromycin 500 Mg Solr (Azithromycin) .Marland Kitchen.. 1 By Mouth Daily X 5 Days 13)  Mucinex 600 Mg Xr12h-Tab (Guaifenesin) .Marland Kitchen.. 1 By Mouth Two Times A Day As Needed Cough 14)  Zofran 4 Mg Tabs (Ondansetron Hcl) .Marland Kitchen.. 1 By Mouth Q 6hrs As Needed Nausea/vomiting  Allergies (verified): 1)  ! * Steroids High Dose 2)  Demerol 3)  Ultram 4)  Darvocet-N 100 5)  Doxycycline  Social History: Smoking Status:  never  Review of Systems       Denies fever, SOB, +abdominal pain, see HPI  Physical Exam  General:  NAD,  Vital signs noted    Abdomen:  normalactive BS, soft, mild ttp mid-epigastrium, no hernia on valsalveur, neg rosvings, no guarding, no rebound, neg murphey's, no mass felt   Impression & Recommendations:  Problem # 1:  GASTROESOPHAGEAL REFLUX, NO ESOPHAGITIS (ICD-530.81) Assessment Deteriorated  Likley worsened in state of gastroenteritis, increase PPI x 7 days. Discussed foods to avoid and sitting upright which pt does secondary to lung disease Her updated medication list for this problem includes:    Nexium 40 Mg Cpdr (Esomeprazole magnesium) .Marland Kitchen... Take 1 capsule by mouth every morning  Orders: [place]- Est Level  3 (31740)  Problem # 2:  GASTROENTERITIS, VIRAL, ACUTE (ICD-008.8) Assessment: Improved  Resolved  CHeck electrolytes as on daily KCL  Orders: [place]- Est Level  3 (99278)  Complete Medication List: 1)  Albuterol 90 Mcg/act Aers (Albuterol) .... Inhale 2 puff using inhaler every 4-6 hours needed shortnes of breath.  you have permission to refill early. 2)  Nexium 40 Mg Cpdr  (Esomeprazole magnesium) .... Take 1 capsule by mouth every morning 3)  Risperdal 0.5 Mg Tabs (Risperidone) .Marland Kitchen.. 1 by mouth at bedtime 4)  Flovent Hfa 110 Mcg/act Aero (Fluticasone propionate  hfa) .... Take one to two puffs two times a day 5)  Oxygen  .... 5-6 liters 6)  Revatio 20 Mg Tabs (Sildenafil citrate) .... Take 4  tablets every 8 hours 7)  Lasix 40 Mg Tabs (Furosemide) .Marland Kitchen.. 1every other day daily 8)  Tylenol 325 Mg Tabs (Acetaminophen) .... As needed 9)  Coreg 3.125 Mg Tabs (Carvedilol) .Marland Kitchen.. 1 two times a day 10)  Potassium Chloride Crys Cr 20 Meq Cr-tabs (Potassium chloride crys cr) .... Take two tabs in am and 1 tab in the pm 11)  Aspirin 81 Mg Tabs (Aspirin) .Marland Kitchen.. 1 tab once daily 12)  Azithromycin 500 Mg Solr (Azithromycin) .Marland Kitchen.. 1 by mouth daily x 5 days 13)  Mucinex 600 Mg Xr12h-tab (Guaifenesin) .Marland Kitchen.. 1 by mouth two times a day as needed cough 14)  Zofran 4 Mg Tabs (Ondansetron hcl) .Marland Kitchen.. 1 by mouth q 6hrs as needed nausea/vomiting  Other Orders: [facility] ([address]806)  Patient Instructions: 1)  Increase your nexium to twice a day x 7 weeks  2)  I will call you with your potassium  3)  If the pain persist please let me know Prescriptions: ZOFRAN 4 MG TABS (ONDANSETRON HCL) 1 by mouth Q 6hrs as needed nausea/vomiting  #30 x 0   Entered and Authorized by:   Vic Blackbird MD   Signed by:   Vic Blackbird MD on 05/26/2009   Method used:   [facility] to        [facility]       [address] [place] [address], [place]  [address]       Ph: [phone]       Fax: [phone]   RxID:   [id number]

## 2010-05-08 NOTE — Progress Notes (Signed)
Summary: Status of form  Phone Note From Other Clinic Call back at [phone] ext [phone]   Caller: Amy/[facility] Summary of Call: checking status of form that was faxed to [place] to certify pt to get oxygen through [facility] Initial call taken by: Samara Snide,  October 20, 2009 8:44 AM  Follow-up for Phone Call        Have you seen this form? Follow-up by: Christen Bame CMA,  October 20, 2009 11:15 AM  Additional Follow-up for Phone Call Additional follow up Details #1::        June I have an oxygen request from [facility] that i signed- it is scanned in the chart, is this the same form. I have not recieved anything recently Additional Follow-up by: Vic Blackbird MD,  October 20, 2009 11:20 AM    Additional Follow-up for Phone Call Additional follow up Details #2::    LMOVM for Amy to call me back Follow-up by: Christen Bame CMA,  October 20, 2009 11:52 AM  Additional Follow-up for Phone Call Additional follow up Details #3:: Details for Additional Follow-up Action Taken: They never recieved form in June.   Will refax now to [facility] @ [phone] Additional Follow-up by: Christen Bame CMA,  October 20, 2009 12:00 PM

## 2010-05-08 NOTE — Assessment & Plan Note (Signed)
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 She is now on Revatio 80 mg tid.  She feels like this has been helping her.  She has increased her 6 minute walk from 35 m to 161.5 m.  Oxygen saturation has not worsened on Revatio and PA pressure has gone down on echo.  She is more active.  She continues to use 5-6 L/min oxygen.  She has been evaluated for heart/lung transplant at [facility] as an inpatient and needs to raise around $10,000 to continue evaluation.  Her church is helping her with fundraising.  Also of note, her ICD is nearing ERI.  Given her end stage lung disease and normalized LV function, it has been decided to turn off tachy therapies and not place a new.  Patient has gained 9 lbs since last appointment.   She is taking Lasix daily.  She says that her appetite has been much better.  She has had some lightheadedness at home, mainly when she first stands up.   Labs (11/10): BNP 91, creatinine 1.0, K 4 Labs (2/11): K 4.6, creatinine 0.86 Labs (3/11): BNP 58, K 4.2, creatinine 0.8  ECG: NSR, 1st degree AV block  Current Medications (verified): 1)  Albuterol 90 Mcg/act Aers (Albuterol) .... Inhale 2 Puff Using Inhaler Every 4-6 Hours Needed Shortnes of Breath.  You Have Permission To Refill Early. 2)  Risperdal 0.5 Mg Tabs (Risperidone) .Marland Kitchen.. 1 By Mouth At Bedtime 3)  Flovent Hfa 110 Mcg/act  Aero (Fluticasone Propionate  Hfa) .... Take One To Two Puffs Two Times A Day 4)  Oxygen .... 5-6 Liters 5)  Revatio 20 Mg Tabs (Sildenafil Citrate) .... Take 4  Tablets Every 8 Hours 6)  Lasix 40 Mg Tabs (Furosemide) .Marland Kitchen.. 1every Other Day Daily 7)  Tylenol 325 Mg Tabs (Acetaminophen) .... As Needed 8)  Coreg 3.125 Mg Tabs (Carvedilol) .Marland Kitchen.. 1 Two Times A Day 9)  Potassium Chloride Crys Cr 20 Meq Cr-Tabs (Potassium  Chloride Crys Cr) .... Take Two Tabs in Am and 1 Tab in The Pm 10)  Aspirin 81 Mg  Tabs (Aspirin) .Marland Kitchen.. 1 Tab Once Daily  Allergies (verified): 1)  ! * Steroids High Dose 2)  Demerol 3)  Ultram 4)  Darvocet-N 100 5)  Doxycycline  Past History:  Past Medical History: 1.  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  She has been referred to [facility] for lung transplant evaluation.  2.  Cardiomyopathy:  Cardiac MRI in 2004 with no evidence for infiltrative disease but EF 40%.  Echo (9/10): EF 60%, severely dilated RV with moderately decreased systolic function, moderate RAE, PASP 83 mmHg. 3.  Pulmonary hypertension with RV failure:  Moderate to severe, likely secondary to sarcoidosis and parenchymal lung disease/hypoxic pulmonary vasoconstriction.  RHC 5/10 showed PA 67/30 (mean 46) with mean PCWP 6 mmHg. Patient started on Revatio in 5/10 without any change in oxygen saturation.  6 minute walk 7/10: 61 m.  Echo (9/10): EF 60%, severely dilated RV with moderately decreased systolic function, moderate RAE, PASP 83 mmHg. RHC (9/10) with mean RA 15, PA 74/36, CI 2.8.  Repeat RHC after diuresis with mean RA 3, PA 60/22, mean PCWP 4.  Echo (1/11): EF 93-23%, grade I diastolic dysfunction, moderately dilated RV, mild to moderate RV dysfunction, moderate to severe TR,  PASP 58 mmHg.  6 minute walk (2/11): 122 m.  6 min walk (6/11): 161.5 m.   4.  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  Device is now nearing ERI.  Given end stage lung disease and normalization of LV function, the device will not be replaced and tachy therapies were turned off.  5.  GERD 6.  Chronic chest pain.  LHC (5/10) with no angiographic coronary disease.  7.  Allergic rhinitis,  8.  h/o iron deficiency anemia.  9.  H/o secondary amenorrhea/irregular menses,  10.  Psychosis with high dose steroids.  11.  Possible runs of SVT  Family History: Reviewed history from 06/05/2006 and no changes required. Father-died in her  1`s due to lung cancer, Mother-`borderline Diabetes`, HTN, CHF, No family hx of breast CA or other cancers  Social History: Reviewed history from 09/21/2008 and no changes required. 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 Daughter has new baby now.   Review of Systems       All systems reviewed and negative except as per HPI.   Vital Signs:  Patient profile:   50 year old female Height:      63 inches Weight:      165 pounds BMI:     29.33 Pulse rate:   75 / minute Pulse (ortho):   74 / minute Pulse rhythm:   regular BP sitting:   102 / 73  (left arm) BP standing:   122 / 80 Cuff size:   regular  Vitals Entered By: Doug Sou CMA (September 20, 2009 10:27 AM)  Serial Vital Signs/Assessments:  Time      Position  BP       Pulse  Resp  Temp     By           Lying RA  125/79   70                    Amanda Trulove CMA           Sitting   121/78   71                    Amanda Trulove CMA           Standing  122/80   74                    Doug Sou CMA  Comments: 2 minutes-121/85  HR 68 3 minutes-119/78 HR 69   By: Doug Sou CMA    Physical Exam  General:  Well developed, well nourished, in no acute distress. Neck:  Neck supple, JVP 7-8 cm. No masses, thyromegaly or abnormal cervical nodes. Lungs:  Clear bilaterally to auscultation and percussion. Heart:  Non-displaced PMI, chest non-tender; regular rate and rhythm, S1, S2 without rubs or gallops. 2/6 systolic murmur LLSB.  Loud P2.  Carotid upstroke normal, no bruit.  Pedals normal pulses. Trace ankle edema bilaterally.  Abdomen:  Bowel sounds positive; abdomen soft and non-tender without masses, organomegaly, or hernias noted. No hepatosplenomegaly. Extremities:  No clubbing or  cyanosis. Neurologic:  Alert and oriented x 3. Psych:  Normal affect.    ICD Specifications Following MD:  Cristopher Peru, MD  ICD Vendor:  St Jude     ICD Model Number:  B524     ICD Serial Number:  [id number] ICD DOI:  03/28/2003     ICD Implanting MD:  Cristopher Peru, MD  Lead 1:    Location: RV     DOI: 03/28/2003     Model #: [id number]     Serial #: [id number]     Status: active  Indications::  SARCOID   ICD Follow Up ICD Dependent:  No      Episodes Coumadin:  No  Brady Parameters Mode VVI     Lower Rate Limit:  40      Tachy Zones VF:  214     VT:  185     Impression & Recommendations:  Problem # 1:  PULMONARY HYPERTENSION (ICD-416.8) Likely due parenchymal lung disease.  Improved symptomatically on Revatio with significantly improved symptoms and 6 minute walk time (she had a 6 minute walk today, reached 161.5 meters.  PASP by echo down from 83 => 58 mmHg with treatment. Continue Revatio 80 mg three times a day. Her oxygen saturation did not worsen with uptitration of Revatio.  I am going to repeat a right heart cath to invasively reassess pulmonary pressures.  My next step would be adding on inhaled Iloprost.   Problem # 2:  CHF (ICD-428.0) Patient's CHF seems primarily right-sided.  She has pulmonary HTN with moderate to severe RV dysfunction.  The LV systolic function was normal by echo in 1/11.  Volume status looks good.  I will have her continue her Lasix at 40 mg every other day.    Problem # 3:  PULMONARY SARCOIDOSIS (ICD-135) Patient is undergoing evaluation for heart-lung transplant at [facility]. She has to raise about $10,000 for the transplant.   Problem # 4:  LIGHTHEADEDNESS BP is ok today and she is not orthostatic.  I will arrange for [facility] to send someone out to check her BP several times a week.   Other Orders: EKG w/ Interpretation (93000) Cardiac Catheterization (Cardiac Cath)  Patient Instructions: 1)  Your physician has requested that you have a cardiac  catheterization.  Cardiac catheterization is used to diagnose and/or treat various heart conditions. Doctors may recommend this procedure for a number of different reasons. The most common reason is to evaluate chest pain. Chest pain can be a symptom of coronary artery disease (CAD), and cardiac catheterization can show whether plaque is narrowing or blocking your heart's arteries. This procedure is also used to evaluate the valves, as well as measure the blood flow and oxygen levels in different parts of your heart.  For further information please visit [email or web address].  Please follow instruction sheet, as given. 2)  Thursday June 23,2011  3)  Your physician recommends that you schedule a follow-up appointment in: 3-4 months with Dr Aundra Dubin.  Appended Document: 3 month rov/sl I made referral to [facility] and they do not provide services for B/P checks  Appended Document: 3 month rov/sl pt aware that [facility] will be unable to provide services for B/P check

## 2010-05-08 NOTE — Letter (Signed)
Summary: Cardiac Catheterization Instructions- [place], [place]  [address] [place]   [place], [place] [address]   Phone: [phone]  Fax: [phone]     09/20/2009 MRN: [id number]  Rachel [name] [address] [place], [place]  [address]  Dear [name],   You are scheduled for a Cardiac Catheterization on Thursday September 28, 2009 with Dr. Aundra Dubin.  Please arrive to the 1st floor of the Heart and Vascular Center at [facility] at 9:30 am on the day of your procedure. Please do not arrive before 6:30 a.m. Call the Heart and Vascular Center at [phone] if you are unable to make your appointmnet. The Code to get into the parking garage under the building is 0100. Take the elevators to the 1st floor. You must have someone to drive you home. Someone must be with you for the first 24 hours after you arrive home. Please wear clothes that are easy to get on and off and wear slip-on shoes. Do not eat or drink after midnight except water with your medications that morning. Bring all your medications and current insurance cards with you.    _x__ Make sure you take your aspirin.  __x_ You may take ALL of your medications with water that morning.  The usual length of stay after your procedure is 2 to 3 hours. This can vary.  If you have any questions, please call the office at the number listed above.   Desiree Lucy, RN, BSN

## 2010-05-08 NOTE — Progress Notes (Signed)
Summary: Rx Req  Phone Note Refill Request Call back at Home Phone [phone] Message from:  Patient  Refills Requested: Medication #1:  POTASSIUM CHLORIDE CRYS CR 20 MEQ CR-TABS take two tabs in am and 1 tab in the pm  Medication #2:  COREG 3.125 MG TABS 1 two times a day  Medication #3:  LASIX 40 MG TABS 1every other day daily  Medication #4:  RISPERDAL 0.5 MG TABS 1 by mouth at bedtime FLOVENT, ALBUTEROL FAXED TO [phone] PHONE NUMBER [phone]  Initial call taken by: Raymond Gurney,  October 05, 2009 3:30 PM    Prescriptions: POTASSIUM CHLORIDE CRYS CR 20 MEQ CR-TABS (POTASSIUM CHLORIDE CRYS CR) take two tabs in am and 1 tab in the pm  #90 x 1   Entered and Authorized by:   Vic Blackbird MD   Signed by:   Vic Blackbird MD on 10/06/2009   Method used:   Telephoned to ...       [address] [facility] (mail-order)             , [place]         Ph: [phone]       Fax: [phone]   RxID:   [id number] COREG 3.125 MG TABS (CARVEDILOL) 1 two times a day  #180 x 1   Entered and Authorized by:   Vic Blackbird MD   Signed by:   Vic Blackbird MD on 10/06/2009   Method used:   Telephoned to ...       [facility] (mail-order)             , [place]         Ph: [phone]       Fax: [phone]   RxID:   [id number] LASIX 40 MG TABS (FUROSEMIDE) 1every other day daily  #90 x 1   Entered and Authorized by:   Vic Blackbird MD   Signed by:   Vic Blackbird MD on 10/06/2009   Method used:   Telephoned to ...       [facility] (mail-order)             , [place]         Ph: [phone]       Fax: [phone]   RxID:   [id number] RISPERDAL 0.5 MG TABS (RISPERIDONE) 1 by mouth at bedtime  #90 x 1   Entered and Authorized by:   Vic Blackbird MD   Signed by:   Vic Blackbird MD on 10/06/2009   Method used:   Telephoned to ...       [facility] (mail-order)             , [place]         Ph: [phone]       Fax: [phone]   RxID:   [id number] ALBUTEROL 90 MCG/ACT AERS (ALBUTEROL) Inhale 2  puff using inhaler every 4-6 hours needed shortnes of breath.  You have permission to refill early.  #3 x 3   Entered and Authorized by:   Vic Blackbird MD   Signed by:   Vic Blackbird MD on 10/06/2009   Method used:   Telephoned to ...       [facility] (mail-order)             , [place]         Ph: [phone]       Fax: [phone]   RxID:   [id number] FLOVENT HFA 110 MCG/ACT  AERO (FLUTICASONE PROPIONATE  HFA) Take one to two puffs two times a day  #3 x 3   Entered and Authorized by:   Vic Blackbird MD   Signed by:   Vic Blackbird MD on 10/06/2009   Method used:   Telephoned to ...       [facility] (mail-order)             , [place]         Ph: [phone]       Fax: [phone]   RxID:   [id number]  Meds called in [place] [phone]

## 2010-05-08 NOTE — Progress Notes (Signed)
Summary: triage  Phone Note Call from Patient Call back at Home Phone [phone]   Caller: Patient Summary of Call: having diarrhea/throwing up and wants to know what to do. Initial call taken by: Audie Clear,  May 25, 2009 11:23 AM  Follow-up for Phone Call        started Monday. others in family have been ill with same. related lots of diarrhea & vomiting. took her water pill this am. she is adamant that she cannot get in here today. told her of my concern for dehydration. she she did not void much yesterday. she accepted appt at 1:30 with pcp tomorrow. asked her to drink as much as she can tolerate today. if she feels bad or worse, call back. she agreed Follow-up by: Elige Radon RN,  May 25, 2009 11:30 AM

## 2010-05-08 NOTE — Progress Notes (Signed)
Summary: RE: O2/ PEND OV  Phone Note Call from Patient Call back at [facility] Phone [phone]   Caller: Patient Call For: YOUNG Summary of Call: PT HAS ROV WED 11/ 30. SHE WILL BRING IN HER O2 BUT WILL NEED NURSE TO MEET HER AT THE FRONT LOBY (ON OUR FLOOR) WHEN SHE ARRIVES AND LET HER USE ONE OF OUR TANKS WHILE SHE IS HERE SO THAT SHE WILL HAVE ENOUGH OF "HERS" TO TRAVEL BACK HOME WITH. CALL HER TODAY AS HER PHONE # WILL "GO AWAY" TOMORROW".  Initial call taken by: Cooper Render, CNA,  February 28, 2010 9:34 AM  Follow-up for Phone Call        Patinet was told to have Krista at the front desk call CDY nurse when she arrives and someone will get a tank out to her. Pt understood this.Francesca Jewett [facility]  February 28, 2010 9:56 AM

## 2010-05-08 NOTE — Miscellaneous (Signed)
Summary: Update Problem List  Clinical Lists Changes  Problems: Removed problem of GASTROENTERITIS, VIRAL, ACUTE (ICD-008.8) Removed problem of HYPOPOTASSEMIA (ICD-276.8) Removed problem of SCHIZOPHRENIA (ICD-295.90) Removed problem of PSYCHOSIS, UNSPECIFIED (ICD-298.9) Removed problem of SOMATIZATION DISORDER (ICD-300.81) Removed problem of CHEST PAIN (ICD-786.50) Removed problem of BACK PAIN (ICD-724.5) Removed problem of ANEMIA (ICD-285.9) Removed problem of PANIC ATTACKS (ICD-300.01) Removed problem of SCIATICA, LEFT (ICD-724.3) Removed problem of LEG CRAMPS (ICD-729.82) Removed problem of ROTATOR CUFF SPRAIN AND STRAIN (ICD-840.4) Removed problem of DE QUERVAIN'S TENOSYNOVITIS (ICD-727.04) Removed problem of RHINITIS, ALLERGIC (ICD-477.9) Observations: Added new observation of PAST MED HX: 1.  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  She has been referred to [facility] for lung transplant evaluation.  2.  Cardiomyopathy:  Cardiac MRI in 2004 with no evidence for infiltrative disease but EF 40%.  Echo (9/10): EF 60%, severely dilated RV with moderately decreased systolic function, moderate RAE, PASP 83 mmHg. 3.  Pulmonary hypertension with RV failure:  Moderate to severe, likely secondary to sarcoidosis and parenchymal lung disease/hypoxic pulmonary vasoconstriction.  RHC 5/10 showed PA 67/30 (mean 46) with mean PCWP 6 mmHg. Patient started on Revatio in 5/10 without any change in oxygen saturation.  6 minute walk 7/10: 61 m.  Echo (9/10): EF 60%, severely dilated RV with moderately decreased systolic function, moderate RAE, PASP 83 mmHg. RHC (9/10) with mean RA 15, PA 74/36, CI 2.8.  Repeat RHC after diuresis with mean RA 3, PA 60/22, mean PCWP 4.  Echo (1/11): EF 94-50%, grade I diastolic dysfunction, moderately dilated RV, mild to moderate RV dysfunction, moderate to severe TR, PASP 58 mmHg.  6 minute walk (2/11): 122 m.  6 min walk (6/11): 161.5 m.  RHC (6/11): mean RA 11, RV  64/15, PA 63/28 mean 43, mean PCWP 14, CI 2.4 thermodilution and 3.2 Fick, PVR 5.3 WU Fick and 7.25 WU thermodilution.  Iloprost begun.  6 min walk (10/11): 158 m.  4.  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  Device is now nearing ERI.  Given end stage lung disease and normalization of LV function, the device will not be replaced and tachy therapies were turned off.  5.  GERD 6.  Chronic chest pain.  LHC (5/10) with no angiographic coronary disease.  7.  Allergic rhinitis,  8.  h/o iron deficiency anemia.  9.  H/o secondary amenorrhea/irregular menses,  10.  Psychosis with high dose steroids.  11.  Possible runs of SVT 12. Recurrent Hypokalemia 13. ? Schizophrenia  14. h/o Rotator cuff sprain  15. Chronic back pain (02/08/2010 11:42)      Past Medical History:    1.  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  She has been referred to [facility] for lung transplant evaluation.     2.  Cardiomyopathy:  Cardiac MRI in 2004 with no evidence for infiltrative disease but EF 40%.  Echo (9/10): EF 60%, severely dilated RV with moderately decreased systolic function, moderate RAE, PASP 83 mmHg.    3.  Pulmonary hypertension with RV failure:  Moderate to severe, likely secondary to sarcoidosis and parenchymal lung disease/hypoxic pulmonary vasoconstriction.  RHC 5/10 showed PA 67/30 (mean 46) with mean PCWP 6 mmHg. Patient started on Revatio in 5/10 without any change in oxygen saturation.  6 minute walk 7/10: 61 m.  Echo (9/10): EF 60%, severely dilated RV with moderately decreased systolic function, moderate RAE, PASP 83 mmHg. RHC (9/10) with mean RA 15, PA 74/36, CI  2.8.  Repeat RHC after diuresis with mean RA 3, PA 60/22, mean PCWP 4.  Echo (1/11): EF 12-45%, grade I diastolic dysfunction, moderately dilated RV, mild to moderate RV dysfunction, moderate to severe TR, PASP 58 mmHg.  6 minute walk (2/11): 122 m.  6 min walk (6/11): 161.5 m.  RHC (6/11): mean RA 11, RV 64/15, PA 63/28 mean  43, mean PCWP 14, CI 2.4 thermodilution and 3.2 Fick, PVR 5.3 WU Fick and 7.25 WU thermodilution.  Iloprost begun.  6 min walk (10/11): 158 m.     4.  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  Device is now nearing ERI.  Given end stage lung disease and normalization of LV function, the device will not be replaced and tachy therapies were turned off.     5.  GERD    6.  Chronic chest pain.  LHC (5/10) with no angiographic coronary disease.     7.  Allergic rhinitis,     8.  h/o iron deficiency anemia.     9.  H/o secondary amenorrhea/irregular menses,     10.  Psychosis with high dose steroids.     11.  Possible runs of SVT    12. Recurrent Hypokalemia    13. ? Schizophrenia     14. h/o Rotator cuff sprain     15. Chronic back pain

## 2010-05-08 NOTE — Assessment & Plan Note (Signed)
Summary: pc2   Visit Type:  Follow-up Primary Provider:  Vic Blackbird MD   History of Present Illness: Rachel [name] returns today for followup of her ICD.  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 She is now on Revatio 80 mg tid.  She feels like this has been helping her.  She has more of an appetite and is less short of breath.  She is more active.  She continues to use 5-6 L/min oxygen.  She has been evaluated for heart/lung transplant at [facility] as an inpatient and needs to make a followup appointment.  The patient had repeat 2D echo several months ago which demonstrated normalization of her LV systolic function ( was previously 20%) to 50-55%.  She does have RV dysfunction.  She has had no intercurrent ICD therapies and her device is at [facility].     Current Medications (verified): 1)  Albuterol 90 Mcg/act Aers (Albuterol) .... Inhale 2 Puff Using Inhaler Every 4-6 Hours Needed Shortnes of Breath.  You Have Permission To Refill Early. 2)  Nexium 40 Mg Cpdr (Esomeprazole Magnesium) .... Take 1 Capsule By Mouth Every Morning 3)  Risperdal 0.5 Mg Tabs (Risperidone) .Marland Kitchen.. 1 By Mouth At Bedtime 4)  Flovent Hfa 110 Mcg/act  Aero (Fluticasone Propionate  Hfa) .... Take One To Two Puffs Two Times A Day 5)  Oxygen .... 5-6 Liters 6)  Revatio 20 Mg Tabs (Sildenafil Citrate) .... Take 4  Tablets Every 8 Hours 7)  Lasix 40 Mg Tabs (Furosemide) .Marland Kitchen.. 1every Other Day Daily 8)  Tylenol 325 Mg Tabs (Acetaminophen) .... As Needed 9)  Coreg 3.125 Mg Tabs (Carvedilol) .Marland Kitchen.. 1 Two Times A Day 10)  Potassium Chloride Crys Cr 20 Meq Cr-Tabs (Potassium Chloride Crys Cr) .... Take Two Tabs in Am and 1 Tab in The Pm 11)  Aspirin 81 Mg  Tabs (Aspirin) .Marland Kitchen.. 1 Tab Once Daily  Allergies: 1)  ! * Steroids High Dose 2)  Demerol 3)  Ultram 4)  Darvocet-N 100 (Propoxyphene N-Apap) 5)  Doxycycline  Past History:  Past Medical History: Last  updated: 01/16/2009 1.  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  She has been referred to [facility] for lung transplant evaluation.  2.  Cardiomyopathy:  Cardiac MRI in 2004 with no evidence for infiltrative disease but EF 40%.  Echo (9/10): EF 60%, severely dilated RV with moderately decreased systolic function, moderate RAE, PASP 83 mmHg. 3.  Pulmonary hypertension with RV failure:  Moderate to severe, likely secondary to sarcoidosis and parenchymal lung disease/hypoxic pulmonary vasoconstriction.  RHC 5/10 showed PA 67/30 (mean 46) with mean PCWP 6 mmHg. Patient started on Revatio in 5/10.  6 minute walk 7/22 200 feet.  Echo (9/10): EF 60%, severely dilated RV with moderately decreased systolic function, moderate RAE, PASP 83 mmHg. RHC (9/10) with mean RA 15, PA 74/36, CI 2.8.  Repeat RHC after diuresis with mean RA 3, PA 60/22, mean PCWP 4.  4.  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  5.  GERD 6.  Chronic chest pain.  LHC (5/10) with no angiographic coronary disease.  7.  Allergic rhinitis,  8.  h/o iron deficiency anemia.  9.  H/o secondary amenorrhea/irregular menses,  10.  Psychosis with high dose steroids.  11.  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): FVC 2000 (56%), FEV1 1400 (47%), ratio 0.7; insignif response to bronchodilator; stable from 1/05 - 12/08/2003, PFTs: mod restriction, FVC 1.9L, FEV1 1.6L, both 53% predicted; slt response to bronchodilators - 04/08/2002 St. Jude single chamber defibrillator placed  (Dr. Cristopher Peru) - 03/31/2003  Review of Systems       The patient complains of chest pain and dyspnea on exertion.  The patient denies syncope and peripheral edema.    Vital Signs:  Patient profile:   50 year old female Height:      63 inches Weight:      151 pounds BMI:     26.85 Pulse rate:   87 / minute BP sitting:   120 / 72  (left arm)  Vitals Entered By: Rachel [name] CMA (April 25, 2009 2:24 PM)  Physical Exam  General:  Well developed, well nourished, in no acute distress. Head:  normocephalic and atraumatic Eyes:  vision grossly intact, pupils equal, pupils round, and pupils reactive to light.   Mouth:  MMM, no exudates, no oral lesions, no erythema Neck:  Neck supple, JVP 7-8 cm. No masses, thyromegaly or abnormal cervical nodes. Chest Wall:  Well healed ICD incision. Lungs:  Crackles at bases bilaterally. Minimal increased work of breathing.  No wheezes. Heart:  RRR with normal S1 and a very prominent S2 (P2).  A soft S4 is present at the LLSB. Abdomen:  Bowel sounds positive; abdomen soft and non-tender without masses, organomegaly, or hernias noted. No hepatosplenomegaly. Pulses:  Pulses 2+ Extremities:  No clubbing or cyanosis. Neurologic:  Alert and oriented x 3.    ICD Specifications Following MD:  Cristopher Peru, MD     ICD Vendor:  [facility] Jude     ICD Model Number:  [id number]     ICD Serial Number:  [id number] ICD DOI:  03/28/2003     ICD Implanting MD:  Cristopher Peru, MD  Lead 1:    Location: RV     DOI: 03/28/2003     Model #: [id number]     Serial #: [id number]     Status: active  Indications::  SARCOID   ICD Follow Up Remote Check?  No Battery Voltage:  2.41 V     Charge Time:  16.6 seconds     Battery Est. Longevity:   ERI Underlying rhythm:  SR ICD Dependent:  No       ICD Device Measurements Right Ventricle:  Amplitude: 7.5 mV, Impedance: 355 ohms, Threshold: 1.0 V at 0.8 msec  Episodes Coumadin:  No Shock:  0     ATP:  0     Nonsustained:  0      Brady  Parameters Mode VVI     Lower Rate Limit:  40      Tachy Zones VF:  214     VT:  185     Tech Comments:  No parameter changes.  Device function normal but battery has reached ERI. Alma Friendly, LPN  April 25, 1025 2:28 PM Tachy therapies turned off today per Dr Lovena Le. Chanetta Marshall RN BSN  April 25, 2009 2:38 PM  MD Comments:  Agree with above.  Impression & Recommendations:  Problem #  1:  AUTOMATIC IMPLANTABLE CARDIAC DEFIBRILLATOR SITU (ICD-V45.02) Her device is at [facility].  I have discussed the issues with the patient.  With her normalization of LV function and her development of near endstage lung disease from her sarcoidosis, I have recommended not placing a new device and have turned off ICD tachy therapies.  Problem # 2:  PULMONARY HYPERTENSION (ICD-416.8) She appears to be symptomatically improved since starting Revatio. A low sodium diet is recommended.  Problem # 3:  HYPOXEMIA (ICD-799.02) She will continue on home oxygen.  Patient Instructions: 1)  Your physician recommends that you schedule a follow-up appointment as needed with Dr Lovena Le

## 2010-05-08 NOTE — Progress Notes (Signed)
Summary: refill  Phone Note Refill Request Call back at Home Phone [phone] Message from:  Patient  Refills Requested: Medication #1:  FLOVENT HFA 110 MCG/ACT  AERO Take one to two puffs two times a day   Supply Requested: 3 months [place]  Initial call taken by: Audie Clear,  April 11, 2009 2:00 PM Caller: Patient Initial call taken by: Audie Clear,  April 11, 2009 2:00 PM  Follow-up for Phone Call        to pcp Follow-up by: Elige Radon RN,  April 11, 2009 2:02 PM    Prescriptions: FLOVENT HFA 110 MCG/ACT  AERO (FLUTICASONE PROPIONATE  HFA) Take one to two puffs two times a day  #3 x 3   Entered and Authorized by:   Vic Blackbird MD   Signed by:   Vic Blackbird MD on 04/11/2009   Method used:   Electronically to        [id number] (retail)       [address] [place], [place]  [address]       Ph: [phone]       Fax: [phone]   RxID:   [id number]

## 2010-05-08 NOTE — Letter (Signed)
Summary: [facility]   Imported By: Marilynne Drivers 02/19/2010 10:13:00  _____________________________________________________________________  External Attachment:    Type:   Image     Comment:   External Document

## 2010-05-08 NOTE — Miscellaneous (Signed)
Summary: [place]   Imported By: Marilynne Drivers 04/18/2009 11:39:28  _____________________________________________________________________  External Attachment:    Type:   Image     Comment:   External Document

## 2010-05-08 NOTE — Assessment & Plan Note (Signed)
Summary: f/u visit. pt request to bring our oxygen for her to  use/bmc   FLU SHOT GIVEN TODAY.Enid Skeens, CMA  February 14, 2010 11:57 AM  Vital Signs:  Patient profile:   50 year old female Height:      63 inches Weight:      168 pounds BMI:     29.87 O2 Sat:      99 % on 6 L/min Temp:     97.6 degrees F oral Pulse rate:   82 / minute BP sitting:   122 / 76  (left arm) Cuff size:   regular  Vitals Entered By: Schuyler Amor CMA (February 14, 2010 11:04 AM)  O2 Flow:  6 L/min CC: F/U  nasal congestion, needs flu shot Is Patient Diabetic? No Pain Assessment Patient in pain? no        Primary Care Provider:  Vic Blackbird MD  CC:  F/U  nasal congestion and needs flu shot.  History of Present Illness:    Has Appt [name] 03/07/10 Dr. Aundra Dubin 04/23/10 next appt Using ihalers every 4-6hours Started on Ventavis 6 times a day  by cards, able to do 6 min walk, doing well, feels a lot better, still awaiting transplant but does not have money to raise. Reviewed cardiology note Med rec  Flu shot needed, states she recieved PNA vaccine at [facility] will need records to document this  Nasal congestion x  few days, grandaugter has a cold, no increase in baseline cough, SOB, no fever, no "ratteling in chest". Unsure of what to use, has used nasonex in the past but would prefer to hold on that   Habits & Providers  Alcohol-Tobacco-Diet     Tobacco Status: quit  Current Medications (verified): 1)  Albuterol 90 Mcg/act Aers (Albuterol) .... Inhale 2 Puff Using Inhaler Every 4-6 Hours Needed Shortnes of Breath.  You Have Permission To Refill Early. 2)  Risperdal 0.5 Mg Tabs (Risperidone) .Marland Kitchen.. 1 By Mouth At Bedtime 3)  Flovent Hfa 110 Mcg/act  Aero (Fluticasone Propionate  Hfa) .... Take One To Two Puffs Two Times A Day 4)  Oxygen .... 5-6 Liters 5)  Revatio 20 Mg Tabs (Sildenafil Citrate) .... Take 4  Tablets Every 8 Hours 6)  Lasix 40 Mg Tabs (Furosemide) .Marland Kitchen.. 1every Other Day  Daily 7)  Tylenol 325 Mg Tabs (Acetaminophen) .... As Needed 8)  Coreg 3.125 Mg Tabs (Carvedilol) .Marland Kitchen.. 1 Two Times A Day 9)  Potassium Chloride Crys Cr 20 Meq Cr-Tabs (Potassium Chloride Crys Cr) .... Take Two Tabs in Am and 1 Tab in The Pm 10)  Aspirin 81 Mg  Tabs (Aspirin) .Marland Kitchen.. 1 Tab Once Daily 11)  Nexium 40 Mg Cpdr (Esomeprazole Magnesium) .... One Tab By Mouth Each Morning For Heartburn / Reflux 12)  Ventavis 20 Mcg/ml Soln (Iloprost) .... Uad  Allergies (verified): 1)  ! * Steroids High Dose 2)  Demerol 3)  Ultram 4)  Darvocet-N 100 5)  Doxycycline  Social History: Smoking Status:  quit  Physical Exam  General:  NAD,  Vital signs noted    Eyes:  clear conjunctiva Nose:  Nares mild erythema of turbinates, mucosa pink, yellow dishcarge noted in bilat nares Mouth:  MMM Neck:  supple Lungs:  clear to ascultation,no rhonchi no wheeze, normal WOB 6L oxygen Heart:  RRR, no murmurs, loud S2   Impression & Recommendations:  Problem # 1:  UPPER RESPIRATORY INFECTION, VIRAL (ICD-465.9) Assessment New  Likley beginning of viral URI  with nasal congestion, OTC nasal saline, no red flags today to indicate need for imaging or antibitoics, pt very keen on her health will call if anything changes Her updated medication list for this problem includes:    Tylenol 325 Mg Tabs (Acetaminophen) .Marland Kitchen... As needed    Aspirin 81 Mg Tabs (Aspirin) .Marland Kitchen... 1 tab once daily  Orders: [place]- Est  Level 4 (75170)  Problem # 2:  PULMONARY SARCOIDOSIS (ICD-135) Assessment: Unchanged  f/u with [name] Given flu shot Medcal relaease so that Pneumovaccine can be documented  Orders: [facility]- Est  Level 4 (01749)  Problem # 3:  HYPERTENSION, BENIGN SYSTEMIC (ICD-401.1) Assessment: Unchanged  Continue Coreg, reviwed labs Needs FLP   Her updated medication list for this problem includes:    Lasix 40 Mg Tabs (Furosemide) .Marland Kitchen... 1every other day daily    Coreg 3.125 Mg Tabs (Carvedilol) .Marland Kitchen... 1 two  times a day  Orders: [place]- Est  Level 4 (44967) Needs preventative update Mammo, PAP, Colonscopy age 20 Note pt worred that she will not have enough Oxygen for Mammo, to call ahead and ask if this can be provded, if not will need to take 2 tanks Lipids need to be checked at next visit  Complete Medication List: 1)  Albuterol 90 Mcg/act Aers (Albuterol) .... Inhale 2 puff using inhaler every 4-6 hours needed shortnes of breath.  you have permission to refill early. 2)  Risperdal 0.5 Mg Tabs (Risperidone) .Marland Kitchen.. 1 by mouth at bedtime 3)  Flovent Hfa 110 Mcg/act Aero (Fluticasone propionate  hfa) .... Take one to two puffs two times a day 4)  Oxygen  .... 5-6 liters 5)  Revatio 20 Mg Tabs (Sildenafil citrate) .... Take 4  tablets every 8 hours 6)  Lasix 40 Mg Tabs (Furosemide) .Marland Kitchen.. 1every other day daily 7)  Tylenol 325 Mg Tabs (Acetaminophen) .... As needed 8)  Coreg 3.125 Mg Tabs (Carvedilol) .Marland Kitchen.. 1 two times a day 9)  Potassium Chloride Crys Cr 20 Meq Cr-tabs (Potassium chloride crys cr) .... Take two tabs in am and 1 tab in the pm 10)  Aspirin 81 Mg Tabs (Aspirin) .Marland Kitchen.. 1 tab once daily 11)  Nexium 40 Mg Cpdr (Esomeprazole magnesium) .... One tab by mouth each morning for heartburn / reflux 12)  Ventavis 20 Mcg/ml Soln (Iloprost) .... Uad  Other Orders: Influenza Vaccine MCR [phone])  Patient Instructions: 1)  Make a return visit for your PAP Smear 2)  Make an appt for your Mammogram 3)  Use the nasal saline if this is not helping call me  4)  I will check on your records from [place]  5)  Get the first appt, do not eat after midnight    Orders Added: 1)  Influenza Vaccine MCR [00025] 2)  [place]- Est  Level 4 [84665]   Immunizations Administered:  Influenza Vaccine # 1:    Vaccine Type: Fluvax MCR    Site: left deltoid    Mfr: GlaxoSmithKline    Dose: 0.5 ml    Route: IM    Given by: Enid Skeens, CMA    Exp. Date: 10/06/2010    Lot #: [id number]    VIS given: 10/31/09 version  given February 14, 2010.  Flu Vaccine Consent Questions:    Do you have a history of severe allergic reactions to this vaccine? no    Any prior history of allergic reactions to egg and/or gelatin? no    Do you have a sensitivity to the preservative  Thimersol? no    Do you have a past history of Guillan-Barre Syndrome? no    Do you currently have an acute febrile illness? no    Have you ever had a severe reaction to latex? no    Vaccine information given and explained to patient? yes    Are you currently pregnant? no   Immunizations Administered:  Influenza Vaccine # 1:    Vaccine Type: Fluvax MCR    Site: left deltoid    Mfr: GlaxoSmithKline    Dose: 0.5 ml    Route: IM    Given by: Enid Skeens, CMA    Exp. Date: 10/06/2010    Lot #: [id number]    VIS given: 10/31/09 version given February 14, 2010.    Prevention & Chronic Care Immunizations   Influenza vaccine: Fluvax MCR  (02/14/2010)   Influenza vaccine due: 12/23/2008    Tetanus booster: 12/07/2004: Done.   Tetanus booster due: 12/08/2014    Pneumococcal vaccine: Not documented  Other Screening   Pap smear: Done.  (12/07/2004)   Pap smear due: 12/07/2005    Mammogram: refused  (10/06/2007)   Mammogram due: 10/05/2008   Smoking status: quit  (02/14/2010)  Lipids   Total Cholesterol: 195  (05/04/2007)   LDL: 111  (05/04/2007)   LDL Direct: Not documented   HDL: 70  (05/04/2007)   Triglycerides: 68  (05/04/2007)  Hypertension   Last Blood Pressure: 122 / 76  (02/14/2010)   Serum creatinine: 0.8  (01/23/2010)   Serum potassium 4.5  (01/23/2010)    Hypertension flowsheet reviewed?: Yes   Progress toward BP goal: At goal  Self-Management Support :   Personal Goals (by the next clinic visit) :      Personal blood pressure goal: 140/90  (05/01/2009)   Hypertension self-management support: Not documented

## 2010-05-08 NOTE — Assessment & Plan Note (Signed)
Summary: f/u,df   Vital Signs:  Patient profile:   50 year old female Height:      63 inches Weight:      153 pounds BMI:     27.20 O2 Sat:      87 % on 6 L/min Temp:     97.5 degrees F oral Pulse rate:   85 / minute BP sitting:   117 / 78  (right arm) Cuff size:   regular  Vitals Entered By: Schuyler Amor CMA (May 01, 2009 11:13 AM)  O2 Flow:  6 L/min CC: cold symptoms/ dysuria/ meds Is Patient Diabetic? No Pain Assessment Patient in pain? no        Primary Care Provider:  Vic Blackbird MD  CC:  cold symptoms/ dysuria/ meds.  History of Present Illness:    50 y.o. 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 Sarcoidosis- followed by [name]. . Maintianing sats of 95% on 6L at home. Admits to URI symptoms x 4 days, cough at baseline, now with rhinorrhea clear, sore throat and ears feel plugged up. Cough is mostly non-productive but she feels some rattling in her chest. Occasionally gets SOB with exertion, denies chest pain since Pacer turned off by Dr.Taylor.  Deneis Fever  2. Dysuria - mild dysuria x 1 week, +dark urine after recent menstrual cycle which lasted 4-5 days, no blood in urine, no abdominal pain  3. CHF- Right sided heart failure secondar to Sarcoidosis/ pulmonary HTN. Defer to cards. Takes lasix as needed when weight increases. Dry weight 162  Has not seen [name] recently Note able to perform exercises at home without assistance, tries to walk when feeling okay  Asked for lab check, recently increased potassium   Habits & Providers  Alcohol-Tobacco-Diet     Tobacco Status: quit  Current Medications (verified): 1)  Albuterol 90 Mcg/act Aers (Albuterol) .... Inhale 2 Puff Using Inhaler Every 4-6 Hours Needed Shortnes of Breath.  You Have Permission To Refill Early. 2)  Nexium 40 Mg Cpdr (Esomeprazole Magnesium) .... Take 1 Capsule By Mouth Every Morning 3)  Risperdal 0.5 Mg Tabs (Risperidone)  .Marland Kitchen.. 1 By Mouth At Bedtime 4)  Flovent Hfa 110 Mcg/act  Aero (Fluticasone Propionate  Hfa) .... Take One To Two Puffs Two Times A Day 5)  Oxygen .... 5-6 Liters 6)  Revatio 20 Mg Tabs (Sildenafil Citrate) .... Take 4  Tablets Every 8 Hours 7)  Lasix 40 Mg Tabs (Furosemide) .Marland Kitchen.. 1every Other Day Daily 8)  Tylenol 325 Mg Tabs (Acetaminophen) .... As Needed 9)  Coreg 3.125 Mg Tabs (Carvedilol) .Marland Kitchen.. 1 Two Times A Day 10)  Potassium Chloride Crys Cr 20 Meq Cr-Tabs (Potassium Chloride Crys Cr) .... Take Two Tabs in Am and 1 Tab in The Pm 11)  Aspirin 81 Mg  Tabs (Aspirin) .Marland Kitchen.. 1 Tab Once Daily 12)  Azithromycin 500 Mg Solr (Azithromycin) .Marland Kitchen.. 1 By Mouth Daily X 5 Days 13)  Mucinex 600 Mg Xr12h-Tab (Guaifenesin) .Marland Kitchen.. 1 By Mouth Two Times A Day As Needed Cough  Allergies (verified): 1)  ! * Steroids High Dose 2)  Demerol 3)  Ultram 4)  Darvocet-N 100 (Propoxyphene N-Apap) 5)  Doxycycline  Review of Systems       Per HPI  Physical Exam  General:  NAD, comfortable on 6L, DOE with going to restroom to give sample Vital signs noted    Ears:  Mild clear fluid in Left TM, right TM no bulging,  no retraction, no erythema Nose:  Clear rhinorrhea, turbinates wnl Mouth:  MMM, mild oropharygenal erythema, no exudates Neck:  No LAD Lungs:  Rare bibasilar crackles, normal WOB, no wheezing, upper airway congestion radiating throughout normal WOB Heart:  RRR, no murmurs Abdomen:  soft, non-tender, and normal bowel sounds.   no CVA tenderness Genitalia:  Deferrred by patient Extremities:  Trace pretibial edema bilaterally   Impression & Recommendations:  Problem # 1:  BACTERIAL PNEUMONIA (ICD-482.9) Assessment New  Will treat for PNA in setting of severe chronic lung disease and increase in hypoxia with exertion, although likely viral. Will start with Azithromycin and supportive care, pt knows Red flags quite well. f/u via phone in next 24 hours. Her updated medication list for this problem  includes:    Azithromycin 500 Mg Solr (Azithromycin) .Marland Kitchen... 1 by mouth daily x 5 days  Orders: [facility]- Est  Level 4 (35456)  Problem # 2:  DYSURIA (ICD-788.1) Assessment: New No evidence of UTI hold for now Her updated medication list for this problem includes:    Azithromycin 500 Mg Solr (Azithromycin) .Marland Kitchen... 1 by mouth daily x 5 days  Orders: Urinalysis-[facility] (00000) [place]- Est  Level 4 (25638)  Problem # 3:  HYPOPOTASSEMIA (ICD-276.8) Assessment: Unchanged  Check potassium today  Orders: [employer]- Est  Level 4 (93734)  Problem # 4:  HYPERTENSION, BENIGN SYSTEMIC (ICD-401.1) Assessment: Improved Continue with cards as previous Her updated medication list for this problem includes:    Lasix 40 Mg Tabs (Furosemide) .Marland Kitchen... 1every other day daily    Coreg 3.125 Mg Tabs (Carvedilol) .Marland Kitchen... 1 two times a day  Orders: Basic Met-[facility] (28768-11572) CBC-[facility] (62035) [facility]- Est  Level 4 (59741)  Complete Medication List: 1)  Albuterol 90 Mcg/act Aers (Albuterol) .... Inhale 2 puff using inhaler every 4-6 hours needed shortnes of breath.  you have permission to refill early. 2)  Nexium 40 Mg Cpdr (Esomeprazole magnesium) .... Take 1 capsule by mouth every morning 3)  Risperdal 0.5 Mg Tabs (Risperidone) .Marland Kitchen.. 1 by mouth at bedtime 4)  Flovent Hfa 110 Mcg/act Aero (Fluticasone propionate  hfa) .... Take one to two puffs two times a day 5)  Oxygen  .... 5-6 liters 6)  Revatio 20 Mg Tabs (Sildenafil citrate) .... Take 4  tablets every 8 hours 7)  Lasix 40 Mg Tabs (Furosemide) .Marland Kitchen.. 1every other day daily 8)  Tylenol 325 Mg Tabs (Acetaminophen) .... As needed 9)  Coreg 3.125 Mg Tabs (Carvedilol) .Marland Kitchen.. 1 two times a day 10)  Potassium Chloride Crys Cr 20 Meq Cr-tabs (Potassium chloride crys cr) .... Take two tabs in am and 1 tab in the pm 11)  Aspirin 81 Mg Tabs (Aspirin) .Marland Kitchen.. 1 tab once daily 12)  Azithromycin 500 Mg Solr (Azithromycin) .Marland Kitchen.. 1 by mouth daily x 5 days 13)  Mucinex 600 Mg Xr12h-tab  (Guaifenesin) .Marland Kitchen.. 1 by mouth two times a day as needed cough  Other Orders: Pulse Oximetry- [place] (94760) A1C-[facility] (63845)  Patient Instructions: 1)  I will call you in the next few days to check your breathing 2)  If your breathing gets worse, or you develop fever please go to the ER 3)  I will check your labs today and call you with results 4)  Please schedule another visit with me to have your complete exam  Prescriptions: MUCINEX 600 MG XR12H-TAB (GUAIFENESIN) 1 by mouth two times a day as needed cough  #20 x 1   Entered and Authorized by:   Lonell Grandchild  [place] MD   Signed by:   Vic Blackbird MD on 05/01/2009   Method used:   Electronically to        [facility] (retail)       [address] [place], [place]  [address]       Ph: [phone]       Fax: [phone]   RxID:   [id number] AZITHROMYCIN 500 MG SOLR (AZITHROMYCIN) 1 by mouth daily x 5 days  #5 x 0   Entered and Authorized by:   Vic Blackbird MD   Signed by:   Vic Blackbird MD on 05/01/2009   Method used:   Electronically to        [facility] (retail)       [address]       [place], [place]  [address]       Ph: [phone]       Fax: [phone]   RxID:   [id number]   Laboratory Results   Urine Tests  Date/Time Received: May 01, 2009 11:25 AM  Date/Time Reported: May 01, 2009 11:41 AM   Routine Urinalysis   Color: yellow Appearance: Clear Glucose: negative   (Normal Range: Negative) Bilirubin: negative   (Normal Range: Negative) Ketone: trace (5)   (Normal Range: Negative) Spec. Gravity: 1.020   (Normal Range: 1.003-1.035) Blood: negative   (Normal Range: Negative) pH: 6.0   (Normal Range: 5.0-8.0) Protein: trace   (Normal Range: Negative) Urobilinogen: 4.0   (Normal Range: 0-1) Nitrite: negative   (Normal Range: Negative) Leukocyte Esterace: negative   (Normal Range: Negative)     Comments: ...............test performed by......Marland KitchenBonnie A. Martinique, MLS (ASCP)cm         Prevention & Chronic Care Immunizations   Influenza vaccine: Fluvax Non-MCR  (12/24/2007)   Influenza vaccine due: 12/23/2008    Tetanus booster: 12/07/2004: Done.   Tetanus booster due: 12/08/2014    Pneumococcal vaccine: Not documented  Other Screening   Pap smear: Done.  (12/07/2004)   Pap smear due: 12/07/2005    Mammogram: refused  (10/06/2007)   Mammogram due: 10/05/2008   Smoking status: quit  (05/01/2009)  Lipids   Total Cholesterol: 195  (05/04/2007)   LDL: 111  (05/04/2007)   LDL Direct: Not documented   HDL: 70  (05/04/2007)   Triglycerides: 68  (05/04/2007)  Hypertension   Last Blood Pressure: 117 / 78  (05/01/2009)   Serum creatinine: 1.0  (02/27/2009)   Serum potassium 4.0  (02/27/2009)    Hypertension flowsheet reviewed?: Yes   Progress toward BP goal: At goal  Self-Management Support :   Personal Goals (by the next clinic visit) :      Personal blood pressure goal: 140/90  (05/01/2009)   Hypertension self-management support: Not documented

## 2010-05-08 NOTE — Assessment & Plan Note (Signed)
Summary: eph/jml   Visit Type:  Follow-up Primary Provider:  Vic Blackbird MD  CC:  no chest pain.  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 She is now on Revatio 80 mg tid.  She feels like this has been helping her.  She has increased her 6 minute walk from 81 m to 161.5 m.  Oxygen saturation has not worsened on Revatio and PA pressure has gone down on echo.  She is more active.  She has been able to walk to her neighbor's house without stopping.  No chest pain, no lightheadedness.  She continues to use 5-6 L/min oxygen.  She has been evaluated for heart/lung transplant at [facility] as an inpatient and needs to raise around $10,000 to continue evaluation.  Her church is helping her with fundraising.  Also of note, her ICD is nearing ERI.  Given her end stage lung disease and normalized LV function, it has been decided to turn off tachy therapies and not place a new generator.  Weight has been stable at 165.  She is taking Lasix every other day.  Right heart cath showed continued moderate pulmonary hypertension with PVR 5.3 WU by Fick and 7.25 WU by thermodilution.   Labs (11/10): BNP 91, creatinine 1.0, K 4 Labs (2/11): K 4.6, creatinine 0.86 Labs (3/11): BNP 58, K 4.2, creatinine 0.8  Current Medications (verified): 1)  Albuterol 90 Mcg/act Aers (Albuterol) .... Inhale 2 Puff Using Inhaler Every 4-6 Hours Needed Shortnes of Breath.  You Have Permission To Refill Early. 2)  Risperdal 0.5 Mg Tabs (Risperidone) .Marland Kitchen.. 1 By Mouth At Bedtime 3)  Flovent Hfa 110 Mcg/act  Aero (Fluticasone Propionate  Hfa) .... Take One To Two Puffs Two Times A Day 4)  Oxygen .... 5-6 Liters 5)  Revatio 20 Mg Tabs (Sildenafil Citrate) .... Take 4  Tablets Every 8 Hours 6)  Lasix 40 Mg Tabs (Furosemide) .Marland Kitchen.. 1every Other Day Daily 7)  Tylenol 325 Mg Tabs (Acetaminophen) .... As Needed 8)  Coreg 3.125 Mg Tabs  (Carvedilol) .Marland Kitchen.. 1 Two Times A Day 9)  Potassium Chloride Crys Cr 20 Meq Cr-Tabs (Potassium Chloride Crys Cr) .... Take Two Tabs in Am and 1 Tab in The Pm 10)  Aspirin 81 Mg  Tabs (Aspirin) .Marland Kitchen.. 1 Tab Once Daily  Allergies (verified): 1)  ! * Steroids High Dose 2)  Demerol 3)  Ultram 4)  Darvocet-N 100 5)  Doxycycline  Past History:  Past Medical History: 1.  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  She has been referred to [facility] for lung transplant evaluation.  2.  Cardiomyopathy:  Cardiac MRI in 2004 with no evidence for infiltrative disease but EF 40%.  Echo (9/10): EF 60%, severely dilated RV with moderately decreased systolic function, moderate RAE, PASP 83 mmHg. 3.  Pulmonary hypertension with RV failure:  Moderate to severe, likely secondary to sarcoidosis and parenchymal lung disease/hypoxic pulmonary vasoconstriction.  RHC 5/10 showed PA 67/30 (mean 46) with mean PCWP 6 mmHg. Patient started on Revatio in 5/10 without any change in oxygen saturation.  6 minute walk 7/10: 61 m.  Echo (9/10): EF 60%, severely dilated RV with moderately decreased systolic function, moderate RAE, PASP 83 mmHg. RHC (9/10) with mean RA 15, PA 74/36, CI 2.8.  Repeat RHC after diuresis with mean RA 3, PA 60/22, mean PCWP 4.  Echo (1/11): EF 55-60%, grade I  diastolic dysfunction, moderately dilated RV, mild to moderate RV dysfunction, moderate to severe TR, PASP 58 mmHg.  6 minute walk (2/11): 122 m.  6 min walk (6/11): 161.5 m.  RHC (6/11): mean RA 11, RV 64/15, PA 63/28 mean 43, mean PCWP 14, CI 2.4 thermodilution and 3.2 Fick, PVR 5.3 WU Fick and 7.25 WU thermodilution.  4.  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  Device is now nearing ERI.  Given end stage lung disease and normalization of LV function, the device will not be replaced and tachy therapies were turned off.  5.  GERD 6.  Chronic chest pain.  LHC (5/10) with no angiographic coronary disease.  7.  Allergic rhinitis,  8.  h/o  iron deficiency anemia.  9.  H/o secondary amenorrhea/irregular menses,  10.  Psychosis with high dose steroids.  11.  Possible runs of SVT  Family History: Reviewed history from 06/05/2006 and no changes required. Father-died in her 76`s due to lung cancer, Mother-`borderline Diabetes`, HTN, CHF, No family hx of breast CA or other cancers  Social History: Reviewed history from 09/20/2009 and no changes required. 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 Daughter has new baby now.   Review of Systems       All systems reviewed and negative except as per HPI.   Vital Signs:  Patient profile:   50 year old female Height:      63 inches Weight:      165 pounds Pulse rate:   72 / minute BP sitting:   116 / 68  (left arm) Cuff size:   regular  Vitals Entered By: Lubertha Basque, CNA (October 25, 2009 8:17 AM)  Physical Exam  General:  Well developed, well nourished, in no acute distress. Neck:  Neck supple, JVP 7 cm. No masses, thyromegaly or abnormal cervical nodes. Lungs:  Clear bilaterally to auscultation and percussion. Heart:  Non-displaced PMI, chest non-tender; regular rate and rhythm, S1, S2 without rubs or gallops. 2/6 systolic murmur LLSB.  Loud P2.  Carotid upstroke normal, no bruit.  Pedals normal pulses. No edema.  Abdomen:  Bowel sounds positive; abdomen soft and non-tender without masses, organomegaly, or hernias noted. No hepatosplenomegaly. Extremities:  No clubbing or cyanosis. Neurologic:  Alert and oriented x 3. Psych:  Normal affect.    ICD Specifications Following MD:  Cristopher Peru, MD     ICD Vendor:  St Jude     ICD Model Number:  [id number]     ICD Serial Number:  [id number] ICD DOI:  03/28/2003     ICD Implanting MD:  Cristopher Peru, MD  Lead  1:    Location: RV     DOI: 03/28/2003     Model #: [id number]     Serial #: [id number]     Status: active  Indications::  SARCOID   ICD Follow Up ICD Dependent:  No      Episodes Coumadin:  No  Brady Parameters Mode VVI     Lower Rate Limit:  40      Tachy Zones VF:  214     VT:  185     Impression & Recommendations:  Problem # 1:  PULMONARY HYPERTENSION (ICD-416.8) Likely due parenchymal lung disease.  Improved symptomatically on Revatio  with significantly improved symptoms and 6 minute walk time (she had a 6 minute walk at last appointment and reached 161.5 meters.  This is still significantly impaired.  Her oxygen saturation did not worsen with uptitration of Revatio.  RHC repeated recently showed moderate pulmonary HTN with high PVR.  I think that inhaled Iloprost would potentially be a good additional therapy for her pulmonary HTN.  Given her parenchymal lung disease, it should target ventilated segments of the lung and limit greater V/Q mismatching.  I will work on getting her started on Iloprost.  She will return 2 weeks after starting the medication to see how she is doing.   Problem # 2:  CHF (ICD-428.0) Patient's CHF seems primarily right-sided.  She has pulmonary HTN with moderate to severe RV dysfunction.  The LV systolic function was normal by echo in 1/11.  Volume status looks good and PCWP was normal on RHC.  I will have her continue her Lasix at 40 mg every other day.    Patient Instructions: 1)  Your physician has recommended you make the following change in your medication:  2)  We are in the process of enrolling you for Ventavis(iloprost). 3)  Your physician recommends that you schedule a follow-up appointment 2 weeks after you start Ventavis(iloprost).

## 2010-05-08 NOTE — Letter (Signed)
Summary: Appointment - Missed  [facility], [place]  [address] [place]   [place], [place] [address]   Phone: [phone]  Fax: [phone]     December 22, 2009 MRN: [id number]   JENNEFER KOPP [address] [place], [place]  [address]   Dear Ms. Glennon Hamilton,  Our records indicate you missed your appointment on   12/20/09 with Dr. Aundra Dubin .It is very important that we reach you to reschedule this appointment. We look forward to participating in your health care needs. Please contact us at the number listed above at your earliest convenience to reschedule this appointment.     Sincerely,  Copy

## 2010-05-08 NOTE — Letter (Signed)
Summary: [employer]   Imported By: Marilynne Drivers 09/30/2009 10:51:57  _____________________________________________________________________  External Attachment:    Type:   Image     Comment:   External Document

## 2010-05-08 NOTE — Miscellaneous (Signed)
Summary: [facility]  [facility]I   Imported By: Beryle Lathe 03/05/2010 11:59:13  _____________________________________________________________________  External Attachment:    Type:   Image     Comment:   External Document

## 2010-05-08 NOTE — Letter (Signed)
Summary: 6 minute walk test  6 minute walk test   Imported By: Jamelle Haring 05/30/2009 13:28:43  _____________________________________________________________________  External Attachment:    Type:   Image     Comment:   External Document

## 2010-05-08 NOTE — Assessment & Plan Note (Signed)
Summary: pt is on o2 needs to switch to ours while here/cb   Primary Arlett Goold/Referring Sravya Grissom:  Vic Blackbird MD  CC:  follow up [place] 08-1008.Marland Kitchen  History of Present Illness: 07/04/08- Sarcoid IV, cardiomyopathy, panic attacks/ anxiety Since last here was hosp with a bronchitis synmdrome- prob viral- on top of her sage IV Sarcoid. Now for 2 weeks has had some increased dyspnea, watery nose. some cough at night with yellow green mucus. Denies fever, change pattern of her chronic tussive midsternal chest wall pain. Ears are draining. No blood. GI ok. Feet do swell. Pacemaker site is no longer sore on chest wall. 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 Becaus ACE was WNL- 30 at that time,  I favored a viral pneumonitis or cardiogenic edema over a flare of her sarcoid.   09/02/08- Sarcoid, cardiomyopathy, panic/ anxiety Hosp twice for repiratory failure/ CHF since last here. Dx'd PHTN- started sildenafil by heart cath.Right heart failure, EF 35%. She feels sildenafil does help her dyspnea with light activity such as brushing teeth. Says eyes get blurry, otw not light headed. Minimal cough, no chest pain.Easy fatigue with short walks but expects to get some better with more time out of hospital. Waiting for reply to establish contact with transplant program at [facility].  March 07, 2010- Sarcoid, cardiomyopathy, PHTN,  panic/ anxiety...........Marland Kitchenfriend here Nurse-CC: follow up [place] 08-1008. First OV w/ me in over a year. Has  been followed in [facility] and by cardiology- notes reviewed from latest visits. Can't remember when but has had at least 2 pneumovax. On Ventavis her O2 sat has improved. Continues Oxygen at 5-6 l/m. No recent acute process, but very limited by exertional dyspnea across a room.   We discussed continuous vs pulse oxygen devices. Denies cough, wheeze, chest pain.  Lung transplant on hold- finances.     Preventive Screening-Counseling &  Management  Alcohol-Tobacco     Smoking Status: quit     Year Quit: 1991     Pack years: 15 years  Current Medications (verified): 1)  Proair Hfa 108 (90 Base) Mcg/act Aers (Albuterol Sulfate) .... 2 Puffs Four Times A Day As Needed 2)  Risperdal 0.5 Mg Tabs (Risperidone) .Marland Kitchen.. 1 By Mouth At Bedtime 3)  Flovent Hfa 110 Mcg/act  Aero (Fluticasone Propionate  Hfa) .... Take One To Two Puffs Two Times A Day 4)  Oxygen .... 5-6 Liters 5)  Revatio 20 Mg Tabs (Sildenafil Citrate) .... Take 4  Tablets Every 8 Hours 6)  Lasix 40 Mg Tabs (Furosemide) .Marland Kitchen.. 1every Other Day Daily 7)  Tylenol 325 Mg Tabs (Acetaminophen) .... As Needed 8)  Coreg 3.125 Mg Tabs (Carvedilol) .Marland Kitchen.. 1 Two Times A Day 9)  Potassium Chloride Crys Cr 20 Meq Cr-Tabs (Potassium Chloride Crys Cr) .... Take Two Tabs in Am and 1 Tab in The Pm 10)  Aspirin 81 Mg  Tabs (Aspirin) .Marland Kitchen.. 1 Tab Once Daily 11)  Nexium 40 Mg Cpdr (Esomeprazole Magnesium) .... One Tab By Mouth Each Morning For Heartburn / Reflux 12)  Ventavis 20 Mcg/ml Soln (Iloprost) .... Uad  Allergies (verified): 1)  ! * Steroids High Dose 2)  Demerol 3)  Ultram 4)  Darvocet-N 100 5)  Doxycycline  Past History:  Past Medical History: Last updated: 02/08/2010 1.  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  She has been referred to [facility] for lung transplant evaluation.  2.  Cardiomyopathy:  Cardiac MRI in 2004 with no evidence  for infiltrative disease but EF 40%.  Echo (9/10): EF 60%, severely dilated RV with moderately decreased systolic function, moderate RAE, PASP 83 mmHg. 3.  Pulmonary hypertension with RV failure:  Moderate to severe, likely secondary to sarcoidosis and parenchymal lung disease/hypoxic pulmonary vasoconstriction.  RHC 5/10 showed PA 67/30 (mean 46) with mean PCWP 6 mmHg. Patient started on Revatio in 5/10 without any change in oxygen saturation.  6 minute walk 7/10: 61 m.  Echo (9/10): EF 60%, severely dilated RV with moderately decreased  systolic function, moderate RAE, PASP 83 mmHg. RHC (9/10) with mean RA 15, PA 74/36, CI 2.8.  Repeat RHC after diuresis with mean RA 3, PA 60/22, mean PCWP 4.  Echo (1/11): EF 09-60%, grade I diastolic dysfunction, moderately dilated RV, mild to moderate RV dysfunction, moderate to severe TR, PASP 58 mmHg.  6 minute walk (2/11): 122 m.  6 min walk (6/11): 161.5 m.  RHC (6/11): mean RA 11, RV 64/15, PA 63/28 mean 43, mean PCWP 14, CI 2.4 thermodilution and 3.2 Fick, PVR 5.3 WU Fick and 7.25 WU thermodilution.  Iloprost begun.  6 min walk (10/11): 158 m.  4.  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  Device is now nearing ERI.  Given end stage lung disease and normalization of LV function, the device will not be replaced and tachy therapies were turned off.  5.  GERD 6.  Chronic chest pain.  LHC (5/10) with no angiographic coronary disease.  7.  Allergic rhinitis,  8.  h/o iron deficiency anemia.  9.  H/o secondary amenorrhea/irregular menses,  10.  Psychosis with high dose steroids.  11.  Possible runs of SVT 12. Recurrent Hypokalemia 13. ? Schizophrenia  14. h/o Rotator cuff sprain  15. 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): FVC 2000 (56%), FEV1 1400 (47%), ratio 0.7; insignif response to bronchodilator; stable from 1/05 - 12/08/2003, PFTs: mod restriction, FVC 1.9L, FEV1 1.6L, both 53% predicted; slt response to bronchodilators - 04/08/2002 St. Jude single chamber defibrillator placed (Dr. 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   Risk Factors: Smoking Status: quit (03/07/2010)  Review of Systems      See HPI       The patient complains of shortness of breath with activity.  The patient denies shortness of breath at rest, productive cough, non-productive cough, coughing up blood, chest pain, irregular heartbeats, acid heartburn, indigestion, loss of appetite, weight change, abdominal pain, difficulty swallowing, sore throat, tooth/dental problems, headaches, nasal congestion/difficulty breathing through nose, sneezing, itching, depression, rash, change in color of mucus, and fever.    Vital Signs:  Patient profile:   50 year old  female Height:      63 inches Weight:      174.13 pounds BMI:     30.96 O2 Sat:      93 % on 6 L/min Pulse rate:   94 / minute BP sitting:   124 / 76  (left arm) Cuff size:   regular  Vitals Entered By: Clayborne Dana CMA (March 07, 2010 10:02 AM)  O2 Flow:  6 L/min CC: follow up visit-last OV 08-1008.   Physical Exam  Additional Exam:  General: A/Ox3; pleasant and cooperative, NAD, supplemental oxygen at 6 L/M 93% on arrival SKIN: no rash, lesions, wig NODES: no lymphadenopathy HEENT: [place]/AT, EOM- WNL, Conjuctivae- clear, PERRLA, TM-WNL, Nose- clear, Throat- clear and wnl,  NECK: Supple w/ fair ROM, JVD- 1-2 cm, normal carotid impulses w/o bruits Thyroid-  CHEST: Clear to P&A, reduced sounds, but no rales, rhonchi, crackles or cough HEART: RRR, no m/g/r heard, P2 is increased ABDOMEN: mild  overweight WEX:HBZJ, nl pulses, trace edema both feet NEURO: Grossly intact to observation      Impression & Recommendations:  Problem # 1:  PULMONARY SARCOIDOSIS (ICD-135) Not clinically active. There is no longer active talk about transplant because of her finances. Studies had not shown infiltration into her heart.  Problem # 2:  PULMONARY HYPERTENSION (ICD-416.8)  She is doing better with Ventavis and her oxygen.  We discussed oxygen therapy. She may eventually benefit from a portable  oxygen concentrator. Her long term prognosis is poor w/o heart/ lung transplant.   Medications Added to Medication List This Visit: 1)  Proair Hfa 108 (90 Base) Mcg/act Aers (Albuterol sulfate) .... 2 puffs four times a day as needed 2)  Oxygen 5-6 L/m Advanced  3)  Ventavis 20 Mcg/ml Soln (Iloprost) .... 6 x/ day neb- 1 ml ampules 4)  Pulse Oximeter  .... Goal saturation over 88%, prefer 90%  Other Orders: Est. Patient Level IV (69678) DME Referral (DME)  Patient Instructions: 1)  Please schedule a follow-up appointment in 6 months. 2)  Continue oxygen at 5-6 L/M. Our goal would be to keep your saturation over 88-90% Prescriptions: PULSE OXIMETER goal saturation over 88%, prefer 90%  #1 x 0   Entered and Authorized by:   Deneise Lever MD   Signed by:   Deneise Lever MD on 03/07/2010   Method used:   Print then Give to Patient   RxID:   [id number]

## 2010-05-08 NOTE — Progress Notes (Signed)
Summary: Rx Ques  Phone Note Call from Patient Call back at [facility] Phone [phone]   Caller: Patient Summary of Call: Pt wondering if her medication from her visit on Monday was sent in by Dr. Buelah Manis? Initial call taken by: Raymond Gurney,  May 05, 2009 3:09 PM  Follow-up for Phone Call        called pharmacy and patient did pick up the antibiotic and was told to get mucinex OTC. called patient and she is referring to having all her  meds sent into mail order. states  Dr. Buelah Manis has all the information about where to send. needs all rx except Revatio. Follow-up by: Marcell Barlow RN,  May 05, 2009 3:53 PM  Additional Follow-up for Phone Call Additional follow up Details #1::        All meds were called in. Including Revation to the special pharmacy. Additional Follow-up by: Vic Blackbird MD,  May 07, 2009 8:06 AM     Appended Document: Rx Ques patient notified.

## 2010-05-08 NOTE — Letter (Signed)
Summary: [place]   Imported By: Raymond Gurney 09/08/2009 10:22:25  _____________________________________________________________________  External Attachment:    Type:   Image     Comment:   External Document

## 2010-05-08 NOTE — Letter (Signed)
Summary: [facility]   Imported By: Marilynne Drivers 09/15/2009 15:01:38  _____________________________________________________________________  External Attachment:    Type:   Image     Comment:   External Document

## 2010-05-08 NOTE — Progress Notes (Signed)
Summary: [employer] needs pre cert for meds asap  Phone Note Call from Patient Call back at Home Phone [phone]   Caller: Patient Reason for Call: Talk to Nurse, Talk to Doctor Summary of Call: Holland Falling called pt and needs a letter stating it is on for her to take 4tablets of Revatio this is for pre certification the phone number for [employer] is [phone] fax# [phone]. Pt needs this done asap and a rtn call letting her know it's been taken care of. Initial call taken by: Shelda Pal,  June 22, 2009 10:11 AM  Follow-up for Phone Call        talked with Mable at [phone] [employer]--she will fax form for Dr Aundra Dubin to complete for authorization of Revatio 8m --four 224mtablets---every 8 hours AnDesiree LucyRN, BSN  June 22, 2009 12:16 PM   Additional Follow-up for Phone Call Additional follow up Details #1::        insurance called back it has been approved, they will fax over the info, JeDarnell LevelMarch 21, 2011 12:51 PM  I talked with pt to let her know it had been approved

## 2010-05-10 NOTE — Letter (Signed)
Summary: Field seismologist   Imported By: Marilynne Drivers 04/13/2010 07:57:14  _____________________________________________________________________  External Attachment:    Type:   Image     Comment:   External Document

## 2010-05-10 NOTE — Progress Notes (Signed)
Summary: renewal form   Phone Note From Other Clinic   Caller: brenda office [phone] Request: Talk with Nurse Summary of Call: pls faxed back  renewal form fax back . fax # [phone] Initial call taken by: Neil Crouch,  March 26, 2010 11:44 AM  Follow-up for Phone Call        [place] RSVP renewal form for Revatio 50m (4) every 8 hours refaxed to [phone]-original in medical records to be scanned into EMR

## 2010-05-10 NOTE — Progress Notes (Signed)
Summary: discuss meds  Phone Note Call from Patient Call back at Home Phone [phone]   Caller: Patient Reason for Call: Talk to Nurse Summary of Call: discuss meds.  Initial call taken by: Neil Crouch,  March 14, 2010 3:42 PM  Follow-up for Phone Call        pt requesting update application for Revatio for 2012--I will fax complete application  to [place] RSVP [phone] once the application is signed by Dr Aundra Dubin. Desiree Lucy, RN, BSN  March 14, 2010 3:50 PM      Appended Document: discuss meds completed application faxed to [place] RSVP/ original to medical records

## 2010-05-10 NOTE — Progress Notes (Signed)
Summary: Pt returning call  Phone Note Call from Patient Call back at Home Phone [phone]   Caller: Patient Summary of Call: Pt returning call can leave a mess Initial call taken by: Delsa Sale,  April 26, 2010 2:09 PM  Follow-up for Phone Call        I talked with pt about lab results from 04/23/10

## 2010-05-10 NOTE — Assessment & Plan Note (Signed)
Summary: Lower back pain   Vital Signs:  Patient profile:   50 year old female Height:      63 inches Weight:      170 pounds BMI:     30.22 O2 Sat:      99 % on 5 L/min Temp:     97.6 degrees F Pulse rate:   81 / minute BP sitting:   123 / 80  (left arm) Cuff size:   regular  Vitals Entered By: Elray Mcgregor RN (April 11, 2010 3:59 PM) CC: [place] for ? UTI Is Patient Diabetic? No   Primary Provider:  Vic Blackbird MD  CC:  [place] for ? UTI.  History of Present Illness: Pt. thinks she may have UTI. Has had bladder pressure and low back pain x2 days.   Pain radiates some into the back of her legs at times.   Has been applying heat and using tylenol which has helped some.  Also concerned about her breathing today, feels she may be wheezing.  Took breathing treatment prior to exam.  Denies chest pain, increase in SOB from her baseline, fever, chills, nausea, vomiting, blood in her urine, constipation, change in bowel habits or blood in her stool, urinary or fecal incontinence and numbness.  Also denies trauma to lower back.    Habits & Providers  Alcohol-Tobacco-Diet     Tobacco Status: quit     Year Quit: 1991     Pack years: 15 years  Current Medications (verified): 1)  Proair Hfa 108 (90 Base) Mcg/act Aers (Albuterol Sulfate) .... 2 Puffs Four Times A Day As Needed 2)  Risperdal 0.5 Mg Tabs (Risperidone) .Marland Kitchen.. 1 By Mouth At Bedtime 3)  Flovent Hfa 110 Mcg/act  Aero (Fluticasone Propionate  Hfa) .... Take One To Two Puffs Two Times A Day 4)  Oxygen 5-6 L/m Advanced 5)  Revatio 20 Mg Tabs (Sildenafil Citrate) .... Take 4  Tablets Every 8 Hours 6)  Lasix 40 Mg Tabs (Furosemide) .Marland Kitchen.. 1every Other Day Daily 7)  Tylenol 325 Mg Tabs (Acetaminophen) .... As Needed 8)  Coreg 3.125 Mg Tabs (Carvedilol) .Marland Kitchen.. 1 Two Times A Day 9)  Potassium Chloride Crys Cr 20 Meq Cr-Tabs (Potassium Chloride Crys Cr) .... Take Two Tabs in Am and 1 Tab in The Pm 10)  Aspirin 81 Mg  Tabs (Aspirin) .Marland Kitchen.. 1  Tab Once Daily 11)  Nexium 40 Mg Cpdr (Esomeprazole Magnesium) .... One Tab By Mouth Each Morning For Heartburn / Reflux 12)  Ventavis 20 Mcg/ml Soln (Iloprost) .... 6 X/ Day Neb- 1 Ml Ampules 13)  Pulse Oximeter .... Goal Saturation Over 88%, Prefer 90%  Allergies (verified): 1)  ! * Steroids High Dose 2)  Demerol 3)  Ultram 4)  Darvocet-N 100 5)  Doxycycline  Review of Systems       Pertinent positives and negatives noted in HPI, Vitals signs noted   Physical Exam  General:  Well-developed,well-nourished,in no acute distress; O2 applied via nasal cannula  Lungs:  Normal respiratory effort, chest expands symmetrically. Lungs are clear to auscultation, no crackles or wheezes. Heart:  Normal rate and regular rhythm. S1 and S2 normal without gallop, murmur, click, rub or other extra sounds. Abdomen:  Bowel sounds positive,abdomen soft with mild diffuse tenderness with deep palpation, without masses, organomegaly or hernias noted. Msk:  No deformity or scoliosis noted of thoracic or lumbar spine.  Tenderness along PSIS bilaterally.   Neurologic:  DTR's 1-2+ and equal bilaterally.  Decreased sensation  on lateral aspect of upper leg.  Other sensation in LE intact.    Impression & Recommendations:  Problem # 1:  LOW BACK PAIN, ACUTE (ICD-724.2)  Patient with somes symptoms of UTI however UA normal, so likely not UTI.  History sounds more like lumbar strain or sacroiliits.  Some mild radicular symptoms however no red flags.   Patient does not want to take NSAIDS at  this time due to pulmonary and cardiac issues.  Since tylenol has been helping, told her to continue with that.  Also showed her some stretches that may help.  If this problem continues or worsens may consider getting lower back films to see if this may be sacroiliitis.  Given red flags on when to return.   Her updated medication list for this problem includes:    Tylenol 325 Mg Tabs (Acetaminophen) .Marland Kitchen... As needed    Aspirin  81 Mg Tabs (Aspirin) .Marland Kitchen... 1 tab once daily  Orders: [place]- Est Level  3 (88875)  Complete Medication List: 1)  Proair Hfa 108 (90 Base) Mcg/act Aers (Albuterol sulfate) .... 2 puffs four times a day as needed 2)  Risperdal 0.5 Mg Tabs (Risperidone) .Marland Kitchen.. 1 by mouth at bedtime 3)  Flovent Hfa 110 Mcg/act Aero (Fluticasone propionate  hfa) .... Take one to two puffs two times a day 4)  Oxygen 5-6 L/m Advanced  5)  Revatio 20 Mg Tabs (Sildenafil citrate) .... Take 4  tablets every 8 hours 6)  Lasix 40 Mg Tabs (Furosemide) .Marland Kitchen.. 1every other day daily 7)  Tylenol 325 Mg Tabs (Acetaminophen) .... As needed 8)  Coreg 3.125 Mg Tabs (Carvedilol) .Marland Kitchen.. 1 two times a day 9)  Potassium Chloride Crys Cr 20 Meq Cr-tabs (Potassium chloride crys cr) .... Take two tabs in am and 1 tab in the pm 10)  Aspirin 81 Mg Tabs (Aspirin) .Marland Kitchen.. 1 tab once daily 11)  Nexium 40 Mg Cpdr (Esomeprazole magnesium) .... One tab by mouth each morning for heartburn / reflux 12)  Ventavis 20 Mcg/ml Soln (Iloprost) .... 6 x/ day neb- 1 ml ampules 13)  Pulse Oximeter  .... Goal saturation over 88%, prefer 90%  Other Orders: Urinalysis-[facility] (00000)   Orders Added: 1)  Urinalysis-[facility] [00000] 2)  [facility]- Est Level  3 [99213]    Laboratory Results   Urine Tests  Date/Time Received: April 11, 2010 3:40 PM  Date/Time Reported: April 11, 2010 3:54 PM   Routine Urinalysis   Color: yellow Appearance: Clear Glucose: negative   (Normal Range: Negative) Bilirubin: negative   (Normal Range: Negative) Ketone: negative   (Normal Range: Negative) Spec. Gravity: 1.025   (Normal Range: 1.003-1.035) Blood: negative   (Normal Range: Negative) pH: 6.5   (Normal Range: 5.0-8.0) Protein: negative   (Normal Range: Negative) Urobilinogen: 1.0   (Normal Range: 0-1) Nitrite: negative   (Normal Range: Negative) Leukocyte Esterace: negative   (Normal Range: Negative)    Comments: ...............test performed by......Marland KitchenBonnie A.  [place], MLS (ASCP)cm

## 2010-05-10 NOTE — Progress Notes (Signed)
Summary: re meds  Phone Note From Other Clinic   Caller:  [place] Summary of Call: pt was approved for meds.  [employer] will send the meds to pt as soon they r able to contact pt.  [employer] left a message for pt. Initial call taken by: Regan Lemming,  April 18, 2010 1:14 PM

## 2010-05-10 NOTE — Assessment & Plan Note (Addendum)
Summary: 3 month rov/sl   Visit Type:  Follow-up Primary Provider:  Vic Blackbird MD  CC:  lower back pain.  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 She is now on Revatio 80 mg tid and and inhaled Iloprost.  She is being closely monitored by [facility] and the Iloprost dose has been increased to 5 mcg 6 x daily.  Oxygen saturation seems to be better now, she is 99% today on 5 L/min oxygen.  She has been evaluated for heart/lung transplant at [facility] as an inpatient but needed to raise around $10,000 to continue evaluation and has decided against this for now.  Also of note, her ICD is nearing ERI.  Given her end stage lung disease and normalized LV function, it has been decided to turn off tachy therapies and not place a new generator.    Patient is feeling symptomatically much better on Iloprost.  She is able to walk around her house without dyspnea.  She was able to get out to see family over Christmas.  She is able to do more housework.  Quality of life is much better.  No chest pain.  No syncope/presyncope.  She was seen recently by [name].  Sarcoid does not seem to be active.   6 minute walk today was up to 273 meters from 158 meters prior.  Labs (11/10): BNP 91, creatinine 1.0, K 4 Labs (2/11): K 4.6, creatinine 0.86 Labs (3/11): BNP 58, K 4.2, creatinine 0.8 Labs (10/11): K 4.5, creatinine 0.8, BNP 76  Current Medications (verified): 1)  Proair Hfa 108 (90 Base) Mcg/act Aers (Albuterol Sulfate) .... 2 Puffs Four Times A Day As Needed 2)  Risperdal 0.5 Mg Tabs (Risperidone) .Marland Kitchen.. 1 By Mouth At Bedtime 3)  Flovent Hfa 110 Mcg/act  Aero (Fluticasone Propionate  Hfa) .... Take One To Two Puffs Two Times A Day 4)  Oxygen 5-6 L/m Advanced 5)  Revatio 20 Mg Tabs (Sildenafil Citrate) .... Take 4  Tablets Every 8 Hours 6)  Lasix 40 Mg Tabs (Furosemide) .Marland Kitchen.. 1every Other Day  Daily 7)  Tylenol 325 Mg Tabs (Acetaminophen) .... As Needed 8)  Coreg 3.125 Mg Tabs (Carvedilol) .Marland Kitchen.. 1 Two Times A Day 9)  Potassium Chloride Crys Cr 20 Meq Cr-Tabs (Potassium Chloride Crys Cr) .... Take Two Tabs in Am and 1 Tab in The Pm 10)  Aspirin 81 Mg  Tabs (Aspirin) .Marland Kitchen.. 1 Tab Once Daily 11)  Nexium 40 Mg Cpdr (Esomeprazole Magnesium) .... One Tab By Mouth Each Morning For Heartburn / Reflux 12)  Ventavis 20 Mcg/ml Soln (Iloprost) .... 6 X/ Day Neb- 1 Ml Ampules 13)  Pulse Oximeter .... Goal Saturation Over 88%, Prefer 90%  Allergies (verified): 1)  ! * Steroids High Dose 2)  Demerol 3)  Ultram 4)  Darvocet-N 100 5)  Doxycycline  Past History:  Past Medical History: 1.  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  She has been referred to [facility] for lung transplant evaluation.  2.  Cardiomyopathy:  Cardiac MRI in 2004 with no evidence for infiltrative disease but EF 40%.  Echo (9/10): EF 60%, severely dilated RV with moderately decreased systolic function, moderate RAE, PASP 83 mmHg. 3.  Pulmonary hypertension with RV failure:  Moderate to severe, likely secondary to sarcoidosis and parenchymal lung disease/hypoxic pulmonary vasoconstriction.  RHC 5/10 showed PA 67/30 (mean 46) with mean PCWP 6 mmHg.  Patient started on Revatio in 5/10 without any change in oxygen saturation.  6 minute walk 7/10: 61 m.  Echo (9/10): EF 60%, severely dilated RV with moderately decreased systolic function, moderate RAE, PASP 83 mmHg. RHC (9/10) with mean RA 15, PA 74/36, CI 2.8.  Repeat RHC after diuresis with mean RA 3, PA 60/22, mean PCWP 4.  Echo (1/11): EF 95-62%, grade I diastolic dysfunction, moderately dilated RV, mild to moderate RV dysfunction, moderate to severe TR, PASP 58 mmHg.  6 minute walk (2/11): 122 m.  6 min walk (6/11): 161.5 m.  RHC (6/11): mean RA 11, RV 64/15, PA 63/28 mean 43, mean PCWP 14, CI 2.4 thermodilution and 3.2 Fick, PVR 5.3 WU Fick and 7.25 WU thermodilution.   Iloprost begun.  6 min walk (10/11): 158 m.  6 min walk (1/12) 273 m.  4.  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  Device is now nearing ERI.  Given end stage lung disease and normalization of LV function, the device will not be replaced and tachy therapies were turned off.  5.  GERD 6.  Chronic chest pain.  LHC (5/10) with no angiographic coronary disease.  7.  Allergic rhinitis,  8.  h/o iron deficiency anemia.  9.  H/o secondary amenorrhea/irregular menses,  10.  Psychosis with high dose steroids.  11.  Possible runs of SVT 12. Recurrent Hypokalemia 13. h/o Rotator cuff sprain 14. Chronic back pain  Family History: Reviewed history from 06/05/2006 and no changes required. Father-died in her 74`s due to lung cancer, Mother-`borderline Diabetes`, HTN, CHF, No family hx of breast CA or other cancers  Social History: Reviewed history from 01/23/2010 and no changes required. 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   Review of Systems       All systems reviewed and negative except as per HPI.   Vital Signs:  Patient profile:   50 year old female Height:      63 inches Weight:      173.75 pounds BMI:     30.89 O2 Sat:      99 % Pulse rate:   73 / minute BP sitting:   112 / 76  (left arm) Cuff size:   regular  Vitals Entered By: Hansel Feinstein CMA (April 23, 2010 9:31 AM)  Nutrition Counseling: Patient's BMI is greater than 25 and therefore counseled on weight management options.  Physical Exam  General:  Well-developed,well-nourished,in no acute distress; O2 applied via nasal cannula  Neck:  Neck supple, no JVD. No masses, thyromegaly or abnormal cervical nodes. Lungs:  Clear bilaterally to auscultation and percussion. Heart:  Non-displaced  PMI, chest non-tender; regular rate and rhythm, S1, S2 without rubs or gallops. 1/6 systolic murmur LLSB.  Loud P2.  Carotid upstroke normal, no bruit.  Pedals normal pulses. No edema.  Abdomen:  Bowel sounds positive; abdomen soft and non-tender without masses, organomegaly, or hernias noted. No hepatosplenomegaly. Extremities:  No clubbing or cyanosis. Neurologic:  Alert and oriented x 3. Psych:  Normal affect.    ICD Specifications Following MD:  Cristopher Peru, MD     ICD Vendor:  [facility] Jude     ICD Model Number:  [id number]     ICD Serial Number:  [id number] ICD DOI:  03/28/2003     ICD Implanting MD:  Cristopher Peru, MD  Lead 1:    Location: RV     DOI: 03/28/2003     Model #: [id number]     Serial #: [id number]     Status: active  Indications::  SARCOID   ICD Follow Up ICD Dependent:  No      Episodes Coumadin:  No  Brady Parameters Mode VVI     Lower Rate Limit:  40      Tachy Zones VF:  214     VT:  185     Impression & Recommendations:  Problem # 1:  PULMONARY HYPERTENSION (ICD-416.8) Likely due parenchymal lung disease.  Iloprost was recently started and uptitrated.  Given her parenchymal lung disease, it should target ventilated segments of the lung and limit greater V/Q mismatching.  She feels much better symptomatically (still class III symptoms though), and she has made improvement on her 6 minute walk.  I am going to continue iloprost (now 5 micrograms 6 x a day) and Revatio.  She is very compliant with iloprost dosing.  Will get BMET/BNP today (on Lasix every other day).  Volume status looks good.  I am going to get an echo to reassess the right ventricle.   Problem # 2:  PULMONARY SARCOIDOSIS (ICD-135) Stable, appears inactive.  Follows with [name].  For now, evaluation for heart-lung transplant is on hold.  Patient is feeling better symptomatically and has been unable to raise the funds needed.   Other Orders: TLB-BNP (B-Natriuretic Peptide) (83880-BNPR) TLB-BMP (Basic Metabolic  Panel-BMET) (73543-ETUYWSB) Echocardiogram (Echo)  Patient Instructions: 1)  Your physician recommends that you have lab today---BMP/BNP 416.0  2)    3)  Your physician has requested that you have an echocardiogram.  Echocardiography is a painless test that uses sound waves to create images of your heart. It provides your doctor with information about the size and shape of your heart and how well your heart's chambers and valves are working.  This procedure takes approximately one hour. There are no restrictions for this procedure. 4)  Your physician wants you to follow-up in: 6 months with Dr Aundra Dubin.Dalbert Garnet 2012)  You will receive a reminder letter in the mail two months in advance. If you don't receive a letter, please call our office to schedule the follow-up appointment.

## 2010-06-24 LAB — POCT I-STAT 3, ART BLOOD GAS (G3+)
Bicarbonate: 27.1 mEq/L — ABNORMAL HIGH (ref 20.0–24.0)
Bicarbonate: 27.4 mEq/L — ABNORMAL HIGH (ref 20.0–24.0)
O2 Saturation: 71 %
TCO2: 29 mmol/L (ref 0–100)
TCO2: 29 mmol/L (ref 0–100)
pCO2 arterial: 46.1 mmHg — ABNORMAL HIGH (ref 35.0–45.0)
pCO2 arterial: 51.3 mmHg — ABNORMAL HIGH (ref 35.0–45.0)
pH, Arterial: 7.331 — ABNORMAL LOW (ref 7.350–7.400)
pH, Arterial: 7.382 (ref 7.350–7.400)
pO2, Arterial: 40 mmHg — ABNORMAL LOW (ref 80.0–100.0)
pO2, Arterial: 85 mmHg (ref 80.0–100.0)

## 2010-06-24 LAB — POCT I-STAT, CHEM 8
BUN: 13 mg/dL (ref 6–23)
Calcium, Ion: 1.22 mmol/L (ref 1.12–1.32)
Chloride: 105 mEq/L (ref 96–112)
Creatinine, Ser: 0.9 mg/dL (ref 0.4–1.2)
Glucose, Bld: 84 mg/dL (ref 70–99)
TCO2: 26 mmol/L (ref 0–100)

## 2010-06-24 LAB — POCT I-STAT 3, VENOUS BLOOD GAS (G3P V)
Bicarbonate: 26.7 mEq/L — ABNORMAL HIGH (ref 20.0–24.0)
O2 Saturation: 72 %
TCO2: 28 mmol/L (ref 0–100)

## 2010-07-02 ENCOUNTER — Telehealth: Payer: Self-pay | Admitting: Family Medicine

## 2010-07-02 NOTE — Telephone Encounter (Signed)
Rachel [name] has refills set up, she needs to call the [facility] and tell them she needs refills, then they will send me the forms to do this. They should have let her know after the last time.

## 2010-07-02 NOTE — Telephone Encounter (Signed)
Rachel [name] calling to ask for refills to [facility]  [phone].  Here's the list below:  Flovent 110 Potassium 20 mg Coreg  3-125 Nexium 41m Risperdall  0.5 mg ProAir 108 Furosemide  40 mg

## 2010-07-06 ENCOUNTER — Other Ambulatory Visit: Payer: Self-pay | Admitting: Family Medicine

## 2010-07-06 MED ORDER — ESOMEPRAZOLE MAGNESIUM 40 MG PO CPDR: 40.0000 mg | DELAYED_RELEASE_CAPSULE | Freq: Every day | ORAL | Status: DC

## 2010-07-06 MED ORDER — ALBUTEROL SULFATE HFA 108 (90 BASE) MCG/ACT IN AERS: 2.0000 | INHALATION_SPRAY | Freq: Four times a day (QID) | RESPIRATORY_TRACT | Status: DC

## 2010-07-13 LAB — GLUCOSE, CAPILLARY
Glucose-Capillary: 118 mg/dL — ABNORMAL HIGH (ref 70–99)
Glucose-Capillary: 126 mg/dL — ABNORMAL HIGH (ref 70–99)
Glucose-Capillary: 136 mg/dL — ABNORMAL HIGH (ref 70–99)
Glucose-Capillary: 140 mg/dL — ABNORMAL HIGH (ref 70–99)
Glucose-Capillary: 156 mg/dL — ABNORMAL HIGH (ref 70–99)
Glucose-Capillary: 172 mg/dL — ABNORMAL HIGH (ref 70–99)
Glucose-Capillary: 82 mg/dL (ref 70–99)
Glucose-Capillary: 106 mg/dL — ABNORMAL HIGH (ref 70–99)
Glucose-Capillary: 107 mg/dL — ABNORMAL HIGH (ref 70–99)
Glucose-Capillary: 110 mg/dL — ABNORMAL HIGH (ref 70–99)
Glucose-Capillary: 110 mg/dL — ABNORMAL HIGH (ref 70–99)
Glucose-Capillary: 114 mg/dL — ABNORMAL HIGH (ref 70–99)
Glucose-Capillary: 120 mg/dL — ABNORMAL HIGH (ref 70–99)
Glucose-Capillary: 121 mg/dL — ABNORMAL HIGH (ref 70–99)
Glucose-Capillary: 125 mg/dL — ABNORMAL HIGH (ref 70–99)
Glucose-Capillary: 126 mg/dL — ABNORMAL HIGH (ref 70–99)
Glucose-Capillary: 127 mg/dL — ABNORMAL HIGH (ref 70–99)
Glucose-Capillary: 128 mg/dL — ABNORMAL HIGH (ref 70–99)
Glucose-Capillary: 129 mg/dL — ABNORMAL HIGH (ref 70–99)
Glucose-Capillary: 134 mg/dL — ABNORMAL HIGH (ref 70–99)
Glucose-Capillary: 136 mg/dL — ABNORMAL HIGH (ref 70–99)
Glucose-Capillary: 137 mg/dL — ABNORMAL HIGH (ref 70–99)
Glucose-Capillary: 140 mg/dL — ABNORMAL HIGH (ref 70–99)
Glucose-Capillary: 143 mg/dL — ABNORMAL HIGH (ref 70–99)
Glucose-Capillary: 148 mg/dL — ABNORMAL HIGH (ref 70–99)
Glucose-Capillary: 153 mg/dL — ABNORMAL HIGH (ref 70–99)
Glucose-Capillary: 154 mg/dL — ABNORMAL HIGH (ref 70–99)
Glucose-Capillary: 161 mg/dL — ABNORMAL HIGH (ref 70–99)
Glucose-Capillary: 164 mg/dL — ABNORMAL HIGH (ref 70–99)
Glucose-Capillary: 169 mg/dL — ABNORMAL HIGH (ref 70–99)
Glucose-Capillary: 175 mg/dL — ABNORMAL HIGH (ref 70–99)
Glucose-Capillary: 194 mg/dL — ABNORMAL HIGH (ref 70–99)
Glucose-Capillary: 99 mg/dL (ref 70–99)

## 2010-07-13 LAB — CBC
HCT: 29.3 % — ABNORMAL LOW (ref 36.0–46.0)
HCT: 36 % (ref 36.0–46.0)
Hemoglobin: 11 g/dL — ABNORMAL LOW (ref 12.0–15.0)
Hemoglobin: 11.4 g/dL — ABNORMAL LOW (ref 12.0–15.0)
Hemoglobin: 11.7 g/dL — ABNORMAL LOW (ref 12.0–15.0)
Hemoglobin: 11.8 g/dL — ABNORMAL LOW (ref 12.0–15.0)
Hemoglobin: 12.6 g/dL (ref 12.0–15.0)
Hemoglobin: 9.7 g/dL — ABNORMAL LOW (ref 12.0–15.0)
Hemoglobin: 9.8 g/dL — ABNORMAL LOW (ref 12.0–15.0)
MCHC: 32.3 g/dL (ref 30.0–36.0)
MCHC: 32.8 g/dL (ref 30.0–36.0)
MCHC: 32.8 g/dL (ref 30.0–36.0)
MCHC: 32.9 g/dL (ref 30.0–36.0)
MCHC: 32.9 g/dL (ref 30.0–36.0)
MCHC: 33.2 g/dL (ref 30.0–36.0)
MCHC: 33.3 g/dL (ref 30.0–36.0)
MCV: 87.5 fL (ref 78.0–100.0)
MCV: 87.8 fL (ref 78.0–100.0)
MCV: 88 fL (ref 78.0–100.0)
MCV: 88.3 fL (ref 78.0–100.0)
MCV: 88.4 fL (ref 78.0–100.0)
MCV: 88.6 fL (ref 78.0–100.0)
MCV: 88.7 fL (ref 78.0–100.0)
Platelets: 183 10*3/uL (ref 150–400)
Platelets: 205 10*3/uL (ref 150–400)
Platelets: 220 10*3/uL (ref 150–400)
Platelets: 255 10*3/uL (ref 150–400)
RBC: 3.35 MIL/uL — ABNORMAL LOW (ref 3.87–5.11)
RBC: 3.48 MIL/uL — ABNORMAL LOW (ref 3.87–5.11)
RBC: 3.59 MIL/uL — ABNORMAL LOW (ref 3.87–5.11)
RBC: 3.75 MIL/uL — ABNORMAL LOW (ref 3.87–5.11)
RBC: 3.95 MIL/uL (ref 3.87–5.11)
RBC: 4 MIL/uL (ref 3.87–5.11)
RBC: 4.33 MIL/uL (ref 3.87–5.11)
RDW: 14.2 % (ref 11.5–15.5)
RDW: 14.4 % (ref 11.5–15.5)
RDW: 14.8 % (ref 11.5–15.5)
RDW: 14.8 % (ref 11.5–15.5)
RDW: 14.9 % (ref 11.5–15.5)
WBC: 12.8 10*3/uL — ABNORMAL HIGH (ref 4.0–10.5)
WBC: 5.3 10*3/uL (ref 4.0–10.5)
WBC: 6.3 10*3/uL (ref 4.0–10.5)
WBC: 7.8 10*3/uL (ref 4.0–10.5)
WBC: 7.9 10*3/uL (ref 4.0–10.5)

## 2010-07-13 LAB — COMPREHENSIVE METABOLIC PANEL
ALT: 13 U/L (ref 0–35)
ALT: 14 U/L (ref 0–35)
AST: 23 U/L (ref 0–37)
Albumin: 3.2 g/dL — ABNORMAL LOW (ref 3.5–5.2)
Alkaline Phosphatase: 46 U/L (ref 39–117)
Alkaline Phosphatase: 54 U/L (ref 39–117)
Alkaline Phosphatase: 56 U/L (ref 39–117)
BUN: 11 mg/dL (ref 6–23)
BUN: 17 mg/dL (ref 6–23)
BUN: 30 mg/dL — ABNORMAL HIGH (ref 6–23)
CO2: 25 mEq/L (ref 19–32)
CO2: 33 mEq/L — ABNORMAL HIGH (ref 19–32)
Calcium: 9.3 mg/dL (ref 8.4–10.5)
Chloride: 104 mEq/L (ref 96–112)
Chloride: 99 mEq/L (ref 96–112)
Chloride: 99 mEq/L (ref 96–112)
Creatinine, Ser: 0.87 mg/dL (ref 0.4–1.2)
Creatinine, Ser: 1.47 mg/dL — ABNORMAL HIGH (ref 0.4–1.2)
GFR calc Af Amer: 46 mL/min — ABNORMAL LOW (ref >60)
GFR calc non Af Amer: 28 mL/min — ABNORMAL LOW (ref >60)
GFR calc non Af Amer: >60 mL/min (ref >60)
Glucose, Bld: 127 mg/dL — ABNORMAL HIGH (ref 70–99)
Glucose, Bld: 147 mg/dL — ABNORMAL HIGH (ref 70–99)
Glucose, Bld: 149 mg/dL — ABNORMAL HIGH (ref 70–99)
Potassium: 3.9 mEq/L (ref 3.5–5.1)
Potassium: 3.9 mEq/L (ref 3.5–5.1)
Potassium: 4.3 mEq/L (ref 3.5–5.1)
Sodium: 137 mEq/L (ref 135–145)
Sodium: 140 mEq/L (ref 135–145)
Sodium: 142 mEq/L (ref 135–145)
Total Bilirubin: 1.1 mg/dL (ref 0.3–1.2)
Total Bilirubin: 1.3 mg/dL — ABNORMAL HIGH (ref 0.3–1.2)
Total Bilirubin: 1.5 mg/dL — ABNORMAL HIGH (ref 0.3–1.2)
Total Protein: 7.3 g/dL (ref 6.0–8.3)
Total Protein: 8 g/dL (ref 6.0–8.3)

## 2010-07-13 LAB — URINALYSIS, ROUTINE W REFLEX MICROSCOPIC
Ketones, ur: NEGATIVE mg/dL
Nitrite: NEGATIVE
pH: 7.5 (ref 5.0–8.0)

## 2010-07-13 LAB — DIFFERENTIAL
Basophils Relative: 0 % (ref 0–1)
Eosinophils Absolute: 0.1 10*3/uL (ref 0.0–0.7)
Lymphocytes Relative: 13 % (ref 12–46)
Lymphocytes Relative: 7 % — ABNORMAL LOW (ref 12–46)
Lymphs Abs: 0.9 10*3/uL (ref 0.7–4.0)
Lymphs Abs: 1.4 10*3/uL (ref 0.7–4.0)
Monocytes Absolute: 0.9 10*3/uL (ref 0.1–1.0)
Monocytes Relative: 5 % (ref 3–12)
Monocytes Relative: 7 % (ref 3–12)
Neutro Abs: 11 10*3/uL — ABNORMAL HIGH (ref 1.7–7.7)
Neutro Abs: 9 10*3/uL — ABNORMAL HIGH (ref 1.7–7.7)
Neutrophils Relative %: 81 % — ABNORMAL HIGH (ref 43–77)
Neutrophils Relative %: 86 % — ABNORMAL HIGH (ref 43–77)

## 2010-07-13 LAB — POCT I-STAT 3, ART BLOOD GAS (G3+)
Acid-Base Excess: 3 mmol/L — ABNORMAL HIGH (ref 0.0–2.0)
Acid-Base Excess: 5 mmol/L — ABNORMAL HIGH (ref 0.0–2.0)
Acid-Base Excess: 7 mmol/L — ABNORMAL HIGH (ref 0.0–2.0)
Acid-Base Excess: 8 mmol/L — ABNORMAL HIGH (ref 0.0–2.0)
Acid-Base Excess: 9 mmol/L — ABNORMAL HIGH (ref 0.0–2.0)
Bicarbonate: 31.1 mEq/L — ABNORMAL HIGH (ref 20.0–24.0)
Bicarbonate: 35.2 mEq/L — ABNORMAL HIGH (ref 20.0–24.0)
O2 Saturation: 77 %
O2 Saturation: 90 %
O2 Saturation: 93 %
O2 Saturation: 93 %
O2 Saturation: 94 %
TCO2: 33 mmol/L (ref 0–100)
TCO2: 33 mmol/L (ref 0–100)
TCO2: 36 mmol/L (ref 0–100)
TCO2: 36 mmol/L (ref 0–100)
TCO2: 41 mmol/L (ref 0–100)
pCO2 arterial: 50.7 mmHg — ABNORMAL HIGH (ref 35.0–45.0)
pCO2 arterial: 51.7 mmHg — ABNORMAL HIGH (ref 35.0–45.0)
pCO2 arterial: 54.7 mmHg — ABNORMAL HIGH (ref 35.0–45.0)
pCO2 arterial: 54.9 mmHg — ABNORMAL HIGH (ref 35.0–45.0)
pCO2 arterial: 56.3 mmHg — ABNORMAL HIGH (ref 35.0–45.0)
pCO2 arterial: 62.9 mmHg (ref 35.0–45.0)
pH, Arterial: 7.373 (ref 7.350–7.400)
pH, Arterial: 7.385 (ref 7.350–7.400)
pH, Arterial: 7.39 (ref 7.350–7.400)
pH, Arterial: 7.41 — ABNORMAL HIGH (ref 7.350–7.400)
pO2, Arterial: 376 mmHg — ABNORMAL HIGH (ref 80.0–100.0)
pO2, Arterial: 62 mmHg — ABNORMAL LOW (ref 80.0–100.0)
pO2, Arterial: 74 mmHg — ABNORMAL LOW (ref 80.0–100.0)

## 2010-07-13 LAB — CULTURE, BLOOD (SINGLE): Culture: NO GROWTH

## 2010-07-13 LAB — CORTISOL: Cortisol, Plasma: 15.5 ug/dL

## 2010-07-13 LAB — BASIC METABOLIC PANEL
BUN: 12 mg/dL (ref 6–23)
BUN: 18 mg/dL (ref 6–23)
BUN: 19 mg/dL (ref 6–23)
BUN: 19 mg/dL (ref 6–23)
CO2: 37 mEq/L — ABNORMAL HIGH (ref 19–32)
CO2: 27 mEq/L (ref 19–32)
CO2: 29 mEq/L (ref 19–32)
CO2: 29 mEq/L (ref 19–32)
CO2: 30 mEq/L (ref 19–32)
CO2: 32 mEq/L (ref 19–32)
CO2: 34 mEq/L — ABNORMAL HIGH (ref 19–32)
CO2: 36 mEq/L — ABNORMAL HIGH (ref 19–32)
CO2: 37 mEq/L — ABNORMAL HIGH (ref 19–32)
CO2: 38 mEq/L — ABNORMAL HIGH (ref 19–32)
Calcium: 8.7 mg/dL (ref 8.4–10.5)
Calcium: 8.7 mg/dL (ref 8.4–10.5)
Calcium: 8.7 mg/dL (ref 8.4–10.5)
Calcium: 9 mg/dL (ref 8.4–10.5)
Calcium: 9 mg/dL (ref 8.4–10.5)
Calcium: 9.2 mg/dL (ref 8.4–10.5)
Calcium: 9.2 mg/dL (ref 8.4–10.5)
Chloride: 95 mEq/L — ABNORMAL LOW (ref 96–112)
Chloride: 100 mEq/L (ref 96–112)
Chloride: 101 mEq/L (ref 96–112)
Chloride: 104 mEq/L (ref 96–112)
Chloride: 108 mEq/L (ref 96–112)
Chloride: 96 mEq/L (ref 96–112)
Chloride: 99 mEq/L (ref 96–112)
Chloride: 99 mEq/L (ref 96–112)
Creatinine, Ser: 0.68 mg/dL (ref 0.4–1.2)
Creatinine, Ser: 0.79 mg/dL (ref 0.4–1.2)
Creatinine, Ser: 0.87 mg/dL (ref 0.4–1.2)
Creatinine, Ser: 0.88 mg/dL (ref 0.4–1.2)
Creatinine, Ser: 0.9 mg/dL (ref 0.4–1.2)
GFR calc Af Amer: >60 mL/min (ref >60)
GFR calc Af Amer: >60 mL/min (ref >60)
GFR calc Af Amer: >60 mL/min (ref >60)
GFR calc Af Amer: >60 mL/min (ref >60)
GFR calc Af Amer: >60 mL/min (ref >60)
GFR calc Af Amer: >60 mL/min (ref >60)
GFR calc non Af Amer: >60 mL/min (ref >60)
GFR calc non Af Amer: >60 mL/min (ref >60)
GFR calc non Af Amer: >60 mL/min (ref >60)
Glucose, Bld: 111 mg/dL — ABNORMAL HIGH (ref 70–99)
Glucose, Bld: 102 mg/dL — ABNORMAL HIGH (ref 70–99)
Glucose, Bld: 115 mg/dL — ABNORMAL HIGH (ref 70–99)
Glucose, Bld: 117 mg/dL — ABNORMAL HIGH (ref 70–99)
Glucose, Bld: 121 mg/dL — ABNORMAL HIGH (ref 70–99)
Glucose, Bld: 137 mg/dL — ABNORMAL HIGH (ref 70–99)
Glucose, Bld: 144 mg/dL — ABNORMAL HIGH (ref 70–99)
Potassium: 4 mEq/L (ref 3.5–5.1)
Potassium: 4.1 mEq/L (ref 3.5–5.1)
Potassium: 4.4 mEq/L (ref 3.5–5.1)
Sodium: 138 mEq/L (ref 135–145)
Sodium: 136 mEq/L (ref 135–145)
Sodium: 137 mEq/L (ref 135–145)
Sodium: 138 mEq/L (ref 135–145)
Sodium: 139 mEq/L (ref 135–145)
Sodium: 141 mEq/L (ref 135–145)
Sodium: 141 mEq/L (ref 135–145)
Sodium: 143 mEq/L (ref 135–145)

## 2010-07-13 LAB — CARDIAC PANEL(CRET KIN+CKTOT+MB+TROPI)
CK, MB: 3.7 ng/mL (ref 0.3–4.0)
CK, MB: 5.2 ng/mL — ABNORMAL HIGH (ref 0.3–4.0)
Relative Index: 1 (ref 0.0–2.5)
Relative Index: INVALID (ref 0.0–2.5)
Total CK: 207 U/L — ABNORMAL HIGH (ref 7–177)

## 2010-07-13 LAB — MRSA PCR SCREENING: MRSA by PCR: NEGATIVE

## 2010-07-13 LAB — POCT I-STAT 3, VENOUS BLOOD GAS (G3P V)
Acid-Base Excess: 8 mmol/L — ABNORMAL HIGH (ref 0.0–2.0)
pO2, Ven: 36 mmHg (ref 30.0–45.0)

## 2010-07-13 LAB — PROTIME-INR: INR: 1.2 (ref 0.00–1.49)

## 2010-07-13 LAB — CULTURE, BLOOD (ROUTINE X 2): Culture: NO GROWTH

## 2010-07-13 LAB — APTT: aPTT: 28 seconds (ref 24–37)

## 2010-07-13 LAB — CULTURE, BAL-QUANTITATIVE W GRAM STAIN: Colony Count: >=100000

## 2010-07-13 LAB — CK TOTAL AND CKMB (NOT AT ARMC): Total CK: 46 U/L (ref 7–177)

## 2010-07-13 LAB — URINE CULTURE: Colony Count: NO GROWTH

## 2010-07-13 LAB — LACTIC ACID, PLASMA: Lactic Acid, Venous: 0.9 mmol/L (ref 0.5–2.2)

## 2010-07-13 LAB — POCT CARDIAC MARKERS: Troponin i, poc: 0.17 ng/mL — ABNORMAL HIGH (ref 0.00–0.09)

## 2010-07-16 LAB — BASIC METABOLIC PANEL
BUN: 14 mg/dL (ref 6–23)
BUN: 25 mg/dL — ABNORMAL HIGH (ref 6–23)
BUN: 26 mg/dL — ABNORMAL HIGH (ref 6–23)
CO2: 27 mEq/L (ref 19–32)
CO2: 32 mEq/L (ref 19–32)
CO2: 33 mEq/L — ABNORMAL HIGH (ref 19–32)
CO2: 34 mEq/L — ABNORMAL HIGH (ref 19–32)
Calcium: 8.9 mg/dL (ref 8.4–10.5)
Calcium: 9.4 mg/dL (ref 8.4–10.5)
Calcium: 9.6 mg/dL (ref 8.4–10.5)
Calcium: 9.8 mg/dL (ref 8.4–10.5)
Chloride: 101 mEq/L (ref 96–112)
Chloride: 102 mEq/L (ref 96–112)
Chloride: 96 mEq/L (ref 96–112)
Creatinine, Ser: 0.7 mg/dL (ref 0.4–1.2)
Creatinine, Ser: 1.04 mg/dL (ref 0.4–1.2)
Creatinine, Ser: 1.24 mg/dL — ABNORMAL HIGH (ref 0.4–1.2)
GFR calc Af Amer: >60 mL/min (ref >60)
GFR calc Af Amer: >60 mL/min (ref >60)
GFR calc Af Amer: >60 mL/min (ref >60)
GFR calc non Af Amer: 52 mL/min — ABNORMAL LOW (ref >60)
GFR calc non Af Amer: 57 mL/min — ABNORMAL LOW (ref >60)
GFR calc non Af Amer: >60 mL/min (ref >60)
Glucose, Bld: 116 mg/dL — ABNORMAL HIGH (ref 70–99)
Glucose, Bld: 119 mg/dL — ABNORMAL HIGH (ref 70–99)
Glucose, Bld: 168 mg/dL — ABNORMAL HIGH (ref 70–99)
Glucose, Bld: 89 mg/dL (ref 70–99)
Potassium: 3.8 mEq/L (ref 3.5–5.1)
Potassium: 4 mEq/L (ref 3.5–5.1)
Sodium: 138 mEq/L (ref 135–145)
Sodium: 140 mEq/L (ref 135–145)
Sodium: 141 mEq/L (ref 135–145)

## 2010-07-16 LAB — POCT I-STAT 3, ART BLOOD GAS (G3+)
Bicarbonate: 27.3 mEq/L — ABNORMAL HIGH (ref 20.0–24.0)
O2 Saturation: 98 %
TCO2: 29 mmol/L (ref 0–100)
pCO2 arterial: 49 mmHg — ABNORMAL HIGH (ref 35.0–45.0)
pH, Arterial: 7.353 (ref 7.350–7.400)
pO2, Arterial: 117 mmHg — ABNORMAL HIGH (ref 80.0–100.0)

## 2010-07-16 LAB — DIFFERENTIAL
Basophils Relative: 0 % (ref 0–1)
Lymphs Abs: 0.8 10*3/uL (ref 0.7–4.0)
Monocytes Relative: 3 % (ref 3–12)
Neutro Abs: 9.5 10*3/uL — ABNORMAL HIGH (ref 1.7–7.7)
Neutrophils Relative %: 88 % — ABNORMAL HIGH (ref 43–77)

## 2010-07-16 LAB — URINALYSIS, DIPSTICK ONLY
Bilirubin Urine: NEGATIVE
Nitrite: NEGATIVE
Specific Gravity, Urine: 1.025 (ref 1.005–1.030)
pH: 5.5 (ref 5.0–8.0)

## 2010-07-16 LAB — CBC
HCT: 36.3 % (ref 36.0–46.0)
HCT: 37.5 % (ref 36.0–46.0)
Hemoglobin: 11.7 g/dL — ABNORMAL LOW (ref 12.0–15.0)
MCHC: 32.2 g/dL (ref 30.0–36.0)
MCV: 88.3 fL (ref 78.0–100.0)
MCV: 88.5 fL (ref 78.0–100.0)
Platelets: 269 10*3/uL (ref 150–400)
RBC: 4.1 MIL/uL (ref 3.87–5.11)
RDW: 15.7 % — ABNORMAL HIGH (ref 11.5–15.5)
RDW: 16.5 % — ABNORMAL HIGH (ref 11.5–15.5)
WBC: 7.3 10*3/uL (ref 4.0–10.5)

## 2010-07-16 LAB — CARDIAC PANEL(CRET KIN+CKTOT+MB+TROPI)
CK, MB: 1.5 ng/mL (ref 0.3–4.0)
Relative Index: INVALID (ref 0.0–2.5)

## 2010-07-16 LAB — CK TOTAL AND CKMB (NOT AT ARMC)
Relative Index: INVALID (ref 0.0–2.5)
Total CK: 31 U/L (ref 7–177)

## 2010-07-16 LAB — LIPASE, BLOOD: Lipase: 16 U/L (ref 11–59)

## 2010-07-16 LAB — URINE CULTURE: Colony Count: NO GROWTH

## 2010-07-16 LAB — POCT CARDIAC MARKERS
CKMB, poc: <1 ng/mL — ABNORMAL LOW (ref 1.0–8.0)
Myoglobin, poc: 23.2 ng/mL (ref 12–200)

## 2010-07-16 LAB — BRAIN NATRIURETIC PEPTIDE: Pro B Natriuretic peptide (BNP): 501 pg/mL — ABNORMAL HIGH (ref 0.0–100.0)

## 2010-07-17 LAB — DIFFERENTIAL
Basophils Absolute: 0 10*3/uL (ref 0.0–0.1)
Basophils Relative: 0 % (ref 0–1)
Eosinophils Absolute: 0.5 10*3/uL (ref 0.0–0.7)
Eosinophils Relative: 5 % (ref 0–5)
Eosinophils Relative: 1 % (ref 0–5)
Lymphocytes Relative: 15 % (ref 12–46)
Lymphocytes Relative: 10 % — ABNORMAL LOW (ref 12–46)
Lymphs Abs: 1.4 10*3/uL (ref 0.7–4.0)
Lymphs Abs: 1.6 10*3/uL (ref 0.7–4.0)
Monocytes Absolute: 0.4 10*3/uL (ref 0.1–1.0)
Monocytes Relative: 4 % (ref 3–12)
Neutrophils Relative %: 76 % (ref 43–77)

## 2010-07-17 LAB — CBC
HCT: 32.2 % — ABNORMAL LOW (ref 36.0–46.0)
HCT: 35.3 % — ABNORMAL LOW (ref 36.0–46.0)
HCT: 35.8 % — ABNORMAL LOW (ref 36.0–46.0)
HCT: 35.5 % — ABNORMAL LOW (ref 36.0–46.0)
Hemoglobin: 10.9 g/dL — ABNORMAL LOW (ref 12.0–15.0)
Hemoglobin: 11.8 g/dL — ABNORMAL LOW (ref 12.0–15.0)
Hemoglobin: 11.9 g/dL — ABNORMAL LOW (ref 12.0–15.0)
MCHC: 32.3 g/dL (ref 30.0–36.0)
MCHC: 32.5 g/dL (ref 30.0–36.0)
MCV: 89.1 fL (ref 78.0–100.0)
MCV: 89.4 fL (ref 78.0–100.0)
Platelets: 255 10*3/uL (ref 150–400)
Platelets: 260 10*3/uL (ref 150–400)
Platelets: 266 10*3/uL (ref 150–400)
Platelets: 273 10*3/uL (ref 150–400)
Platelets: 319 10*3/uL (ref 150–400)
RBC: 3.88 MIL/uL (ref 3.87–5.11)
RBC: 3.97 MIL/uL (ref 3.87–5.11)
RDW: 16.8 % — ABNORMAL HIGH (ref 11.5–15.5)
RDW: 17.6 % — ABNORMAL HIGH (ref 11.5–15.5)
RDW: 17 % — ABNORMAL HIGH (ref 11.5–15.5)
WBC: 10 10*3/uL (ref 4.0–10.5)
WBC: 10.8 10*3/uL — ABNORMAL HIGH (ref 4.0–10.5)
WBC: 7.3 10*3/uL (ref 4.0–10.5)
WBC: 8 10*3/uL (ref 4.0–10.5)

## 2010-07-17 LAB — CARDIAC PANEL(CRET KIN+CKTOT+MB+TROPI)
CK, MB: 0.7 ng/mL (ref 0.3–4.0)
CK, MB: 0.7 ng/mL (ref 0.3–4.0)
CK, MB: 0.9 ng/mL (ref 0.3–4.0)
CK, MB: 0.5 ng/mL (ref 0.3–4.0)
Relative Index: INVALID (ref 0.0–2.5)
Relative Index: INVALID (ref 0.0–2.5)
Total CK: 31 U/L (ref 7–177)
Total CK: 18 U/L (ref 7–177)
Total CK: 19 U/L (ref 7–177)
Troponin I: 0.03 ng/mL (ref 0.00–0.06)
Troponin I: 0.03 ng/mL (ref 0.00–0.06)
Troponin I: 0.02 ng/mL (ref 0.00–0.06)
Troponin I: 0.02 ng/mL (ref 0.00–0.06)
Troponin I: 0.02 ng/mL (ref 0.00–0.06)

## 2010-07-17 LAB — POCT CARDIAC MARKERS
CKMB, poc: <1 ng/mL — ABNORMAL LOW (ref 1.0–8.0)
Myoglobin, poc: 35.3 ng/mL (ref 12–200)
Troponin i, poc: <0.05 ng/mL (ref 0.00–0.09)

## 2010-07-17 LAB — BASIC METABOLIC PANEL
BUN: 13 mg/dL (ref 6–23)
BUN: 15 mg/dL (ref 6–23)
BUN: 16 mg/dL (ref 6–23)
BUN: 8 mg/dL (ref 6–23)
BUN: 9 mg/dL (ref 6–23)
CO2: 34 mEq/L — ABNORMAL HIGH (ref 19–32)
CO2: 34 mEq/L — ABNORMAL HIGH (ref 19–32)
Calcium: 8.8 mg/dL (ref 8.4–10.5)
Calcium: 8.8 mg/dL (ref 8.4–10.5)
Chloride: 100 mEq/L (ref 96–112)
Chloride: 101 mEq/L (ref 96–112)
Chloride: 104 mEq/L (ref 96–112)
Chloride: 105 mEq/L (ref 96–112)
Chloride: 102 mEq/L (ref 96–112)
Creatinine, Ser: 0.89 mg/dL (ref 0.4–1.2)
Creatinine, Ser: 0.97 mg/dL (ref 0.4–1.2)
Creatinine, Ser: 1.03 mg/dL (ref 0.4–1.2)
Creatinine, Ser: 1.24 mg/dL — ABNORMAL HIGH (ref 0.4–1.2)
Creatinine, Ser: 0.97 mg/dL (ref 0.4–1.2)
GFR calc Af Amer: 56 mL/min — ABNORMAL LOW (ref >60)
GFR calc Af Amer: >60 mL/min (ref >60)
GFR calc Af Amer: >60 mL/min (ref >60)
GFR calc non Af Amer: 46 mL/min — ABNORMAL LOW (ref >60)
GFR calc non Af Amer: 57 mL/min — ABNORMAL LOW (ref >60)
GFR calc non Af Amer: 57 mL/min — ABNORMAL LOW (ref >60)
GFR calc non Af Amer: >60 mL/min (ref >60)
GFR calc non Af Amer: >60 mL/min (ref >60)
Glucose, Bld: 110 mg/dL — ABNORMAL HIGH (ref 70–99)
Glucose, Bld: 90 mg/dL (ref 70–99)
Glucose, Bld: 91 mg/dL (ref 70–99)
Glucose, Bld: 95 mg/dL (ref 70–99)
Glucose, Bld: 103 mg/dL — ABNORMAL HIGH (ref 70–99)
Potassium: 3.4 mEq/L — ABNORMAL LOW (ref 3.5–5.1)
Potassium: 3.4 mEq/L — ABNORMAL LOW (ref 3.5–5.1)
Potassium: 3.8 mEq/L (ref 3.5–5.1)
Potassium: 3.8 mEq/L (ref 3.5–5.1)
Potassium: 4.1 mEq/L (ref 3.5–5.1)
Potassium: 4.1 mEq/L (ref 3.5–5.1)
Potassium: 4.2 mEq/L (ref 3.5–5.1)
Sodium: 138 mEq/L (ref 135–145)
Sodium: 140 mEq/L (ref 135–145)

## 2010-07-17 LAB — BRAIN NATRIURETIC PEPTIDE
Pro B Natriuretic peptide (BNP): 563 pg/mL — ABNORMAL HIGH (ref 0.0–100.0)
Pro B Natriuretic peptide (BNP): 914 pg/mL — ABNORMAL HIGH (ref 0.0–100.0)
Pro B Natriuretic peptide (BNP): 780 pg/mL — ABNORMAL HIGH (ref 0.0–100.0)

## 2010-07-17 LAB — COMPREHENSIVE METABOLIC PANEL
ALT: 13 U/L (ref 0–35)
AST: 15 U/L (ref 0–37)
Alkaline Phosphatase: 47 U/L (ref 39–117)
CO2: 34 mEq/L — ABNORMAL HIGH (ref 19–32)
Chloride: 105 mEq/L (ref 96–112)
GFR calc Af Amer: >60 mL/min (ref >60)
GFR calc non Af Amer: 60 mL/min — ABNORMAL LOW (ref >60)
Potassium: 3.2 mEq/L — ABNORMAL LOW (ref 3.5–5.1)
Sodium: 146 mEq/L — ABNORMAL HIGH (ref 135–145)
Total Bilirubin: 1.3 mg/dL — ABNORMAL HIGH (ref 0.3–1.2)

## 2010-07-17 LAB — POCT I-STAT 3, ART BLOOD GAS (G3+): Acid-Base Excess: 6 mmol/L — ABNORMAL HIGH (ref 0.0–2.0)

## 2010-07-17 LAB — POCT I-STAT, CHEM 8
BUN: 11 mg/dL (ref 6–23)
Calcium, Ion: 1.08 mmol/L — ABNORMAL LOW (ref 1.12–1.32)
Creatinine, Ser: 1.1 mg/dL (ref 0.4–1.2)
Glucose, Bld: 106 mg/dL — ABNORMAL HIGH (ref 70–99)
TCO2: 29 mmol/L (ref 0–100)

## 2010-07-17 LAB — TSH: TSH: 1.609 u[IU]/mL (ref 0.350–4.500)

## 2010-07-17 LAB — POCT I-STAT 3, VENOUS BLOOD GAS (G3P V)
Acid-Base Excess: 7 mmol/L — ABNORMAL HIGH (ref 0.0–2.0)
Bicarbonate: 32.1 mEq/L — ABNORMAL HIGH (ref 20.0–24.0)
TCO2: 34 mmol/L (ref 0–100)

## 2010-07-17 LAB — PROTIME-INR: INR: 1.3 (ref 0.00–1.49)

## 2010-07-17 LAB — VITAMIN D 1,25 DIHYDROXY
Vitamin D2 1, 25 (OH)2: <8 pg/mL
Vitamin D3 1, 25 (OH)2: 31 pg/mL

## 2010-07-19 ENCOUNTER — Telehealth: Payer: Self-pay | Admitting: Family Medicine

## 2010-07-19 NOTE — Telephone Encounter (Signed)
Rachel [name] received her inhalers from [facility], but only received one.  Her rx was written as such.  Should have sent for 3 pk for 90 day supply with 3 refills.  Please resubmit rx to them.  Let her know when this has been done.

## 2010-07-20 NOTE — Telephone Encounter (Signed)
I called and discussed with her [employer], she received 3 of the flovent and 1 albuterol. This has been changed, they will send 4 of the albuterol which is a 90 day supply . This should be out to her this week.

## 2010-07-23 LAB — URINALYSIS, ROUTINE W REFLEX MICROSCOPIC
Ketones, ur: NEGATIVE mg/dL
Leukocytes, UA: NEGATIVE
Nitrite: NEGATIVE
pH: 6 (ref 5.0–8.0)

## 2010-07-23 LAB — BASIC METABOLIC PANEL
BUN: 10 mg/dL (ref 6–23)
BUN: 12 mg/dL (ref 6–23)
BUN: 16 mg/dL (ref 6–23)
BUN: 19 mg/dL (ref 6–23)
BUN: 20 mg/dL (ref 6–23)
CO2: 30 mEq/L (ref 19–32)
CO2: 32 mEq/L (ref 19–32)
CO2: 32 mEq/L (ref 19–32)
CO2: 35 mEq/L — ABNORMAL HIGH (ref 19–32)
Calcium: 8.9 mg/dL (ref 8.4–10.5)
Calcium: 9.1 mg/dL (ref 8.4–10.5)
Calcium: 9.1 mg/dL (ref 8.4–10.5)
Calcium: 9 mg/dL (ref 8.4–10.5)
Chloride: 103 mEq/L (ref 96–112)
Chloride: 104 mEq/L (ref 96–112)
Chloride: 96 mEq/L (ref 96–112)
Chloride: 97 mEq/L (ref 96–112)
Creatinine, Ser: 1.07 mg/dL (ref 0.4–1.2)
Creatinine, Ser: 1.1 mg/dL (ref 0.4–1.2)
Creatinine, Ser: 1.16 mg/dL (ref 0.4–1.2)
Creatinine, Ser: 0.99 mg/dL (ref 0.4–1.2)
GFR calc Af Amer: >60 mL/min (ref >60)
GFR calc Af Amer: >60 mL/min (ref >60)
GFR calc Af Amer: >60 mL/min (ref >60)
GFR calc non Af Amer: 48 mL/min — ABNORMAL LOW (ref >60)
GFR calc non Af Amer: 50 mL/min — ABNORMAL LOW (ref >60)
GFR calc non Af Amer: 53 mL/min — ABNORMAL LOW (ref >60)
GFR calc non Af Amer: 55 mL/min — ABNORMAL LOW (ref >60)
GFR calc non Af Amer: >60 mL/min (ref >60)
GFR calc non Af Amer: >60 mL/min (ref >60)
Glucose, Bld: 105 mg/dL — ABNORMAL HIGH (ref 70–99)
Glucose, Bld: 108 mg/dL — ABNORMAL HIGH (ref 70–99)
Glucose, Bld: 112 mg/dL — ABNORMAL HIGH (ref 70–99)
Glucose, Bld: 84 mg/dL (ref 70–99)
Potassium: 4 mEq/L (ref 3.5–5.1)
Potassium: 4.4 mEq/L (ref 3.5–5.1)
Potassium: 4.6 mEq/L (ref 3.5–5.1)
Potassium: 4.7 mEq/L (ref 3.5–5.1)
Potassium: 4.9 mEq/L (ref 3.5–5.1)
Sodium: 135 mEq/L (ref 135–145)
Sodium: 137 mEq/L (ref 135–145)
Sodium: 138 mEq/L (ref 135–145)
Sodium: 140 mEq/L (ref 135–145)
Sodium: 142 mEq/L (ref 135–145)

## 2010-07-23 LAB — CBC
HCT: 27.1 % — ABNORMAL LOW (ref 36.0–46.0)
HCT: 27.8 % — ABNORMAL LOW (ref 36.0–46.0)
HCT: 30.2 % — ABNORMAL LOW (ref 36.0–46.0)
HCT: 31.9 % — ABNORMAL LOW (ref 36.0–46.0)
HCT: 33.5 % — ABNORMAL LOW (ref 36.0–46.0)
Hemoglobin: 10.4 g/dL — ABNORMAL LOW (ref 12.0–15.0)
Hemoglobin: 10.6 g/dL — ABNORMAL LOW (ref 12.0–15.0)
Hemoglobin: 11.7 g/dL — ABNORMAL LOW (ref 12.0–15.0)
Hemoglobin: 8.7 g/dL — ABNORMAL LOW (ref 12.0–15.0)
Hemoglobin: 9.9 g/dL — ABNORMAL LOW (ref 12.0–15.0)
MCHC: 31.7 g/dL (ref 30.0–36.0)
MCHC: 32.2 g/dL (ref 30.0–36.0)
MCHC: 32.7 g/dL (ref 30.0–36.0)
MCHC: 31.8 g/dL (ref 30.0–36.0)
MCV: 86.3 fL (ref 78.0–100.0)
MCV: 86.4 fL (ref 78.0–100.0)
MCV: 86.7 fL (ref 78.0–100.0)
MCV: 86.9 fL (ref 78.0–100.0)
MCV: 87 fL (ref 78.0–100.0)
MCV: 88 fL (ref 78.0–100.0)
Platelets: 248 10*3/uL (ref 150–400)
Platelets: 253 10*3/uL (ref 150–400)
Platelets: 256 10*3/uL (ref 150–400)
Platelets: 260 10*3/uL (ref 150–400)
Platelets: 264 10*3/uL (ref 150–400)
Platelets: 305 10*3/uL (ref 150–400)
RBC: 4.13 MIL/uL (ref 3.87–5.11)
RBC: 3.5 MIL/uL — ABNORMAL LOW (ref 3.87–5.11)
RDW: 15.1 % (ref 11.5–15.5)
RDW: 15.3 % (ref 11.5–15.5)
RDW: 15.4 % (ref 11.5–15.5)
RDW: 15.4 % (ref 11.5–15.5)
RDW: 15.4 % (ref 11.5–15.5)
RDW: 16.1 % — ABNORMAL HIGH (ref 11.5–15.5)
RDW: 16.3 % — ABNORMAL HIGH (ref 11.5–15.5)
WBC: 11.3 10*3/uL — ABNORMAL HIGH (ref 4.0–10.5)
WBC: 8.2 10*3/uL (ref 4.0–10.5)
WBC: 9.9 10*3/uL (ref 4.0–10.5)

## 2010-07-23 LAB — URINE CULTURE: Special Requests: NEGATIVE

## 2010-07-23 LAB — DIFFERENTIAL
Basophils Relative: 1 % (ref 0–1)
Eosinophils Absolute: 0.5 10*3/uL (ref 0.0–0.7)
Lymphs Abs: 1.3 10*3/uL (ref 0.7–4.0)
Monocytes Absolute: 0.6 10*3/uL (ref 0.1–1.0)
Monocytes Relative: 7 % (ref 3–12)
Neutro Abs: 7.4 10*3/uL (ref 1.7–7.7)

## 2010-07-23 LAB — CULTURE, BLOOD (ROUTINE X 2)
Culture: NO GROWTH
Culture: NO GROWTH

## 2010-07-23 LAB — GLUCOSE, CAPILLARY
Glucose-Capillary: 114 mg/dL — ABNORMAL HIGH (ref 70–99)
Glucose-Capillary: 124 mg/dL — ABNORMAL HIGH (ref 70–99)

## 2010-07-23 LAB — POCT I-STAT, CHEM 8
Calcium, Ion: 1.19 mmol/L (ref 1.12–1.32)
Chloride: 105 mEq/L (ref 96–112)
Creatinine, Ser: 0.9 mg/dL (ref 0.4–1.2)
Glucose, Bld: 87 mg/dL (ref 70–99)
HCT: 38 % (ref 36.0–46.0)

## 2010-07-23 LAB — CK TOTAL AND CKMB (NOT AT ARMC)
CK, MB: 0.9 ng/mL (ref 0.3–4.0)
Total CK: 44 U/L (ref 7–177)

## 2010-07-23 LAB — COMPREHENSIVE METABOLIC PANEL
ALT: 11 U/L (ref 0–35)
AST: 17 U/L (ref 0–37)
Albumin: 2.7 g/dL — ABNORMAL LOW (ref 3.5–5.2)
Albumin: 3.2 g/dL — ABNORMAL LOW (ref 3.5–5.2)
Alkaline Phosphatase: 58 U/L (ref 39–117)
Alkaline Phosphatase: 69 U/L (ref 39–117)
BUN: 15 mg/dL (ref 6–23)
BUN: 8 mg/dL (ref 6–23)
Chloride: 101 mEq/L (ref 96–112)
Chloride: 99 mEq/L (ref 96–112)
Creatinine, Ser: 1.17 mg/dL (ref 0.4–1.2)
GFR calc Af Amer: >60 mL/min (ref >60)
Glucose, Bld: 111 mg/dL — ABNORMAL HIGH (ref 70–99)
Potassium: 3.9 mEq/L (ref 3.5–5.1)
Potassium: 4 mEq/L (ref 3.5–5.1)
Sodium: 143 mEq/L (ref 135–145)
Total Bilirubin: 0.5 mg/dL (ref 0.3–1.2)
Total Bilirubin: 0.6 mg/dL (ref 0.3–1.2)

## 2010-07-23 LAB — POCT CARDIAC MARKERS: Troponin i, poc: <0.05 ng/mL (ref 0.00–0.09)

## 2010-07-23 LAB — BLOOD GAS, ARTERIAL
Bicarbonate: 35.4 mEq/L — ABNORMAL HIGH (ref 20.0–24.0)
Patient temperature: 98.6
pH, Arterial: 7.34 — ABNORMAL LOW (ref 7.350–7.400)
pO2, Arterial: 48.9 mmHg — ABNORMAL LOW (ref 80.0–100.0)

## 2010-07-23 LAB — CARDIAC PANEL(CRET KIN+CKTOT+MB+TROPI)
Relative Index: INVALID (ref 0.0–2.5)
Total CK: 31 U/L (ref 7–177)
Troponin I: 0.01 ng/mL (ref 0.00–0.06)

## 2010-07-23 LAB — HEPATIC FUNCTION PANEL
Bilirubin, Direct: 0.2 mg/dL (ref 0.0–0.3)
Indirect Bilirubin: 0.6 mg/dL (ref 0.3–0.9)
Total Bilirubin: 0.8 mg/dL (ref 0.3–1.2)

## 2010-07-23 LAB — URINALYSIS, DIPSTICK ONLY
Glucose, UA: NEGATIVE mg/dL
Hgb urine dipstick: NEGATIVE
Protein, ur: 100 mg/dL — AB
Urobilinogen, UA: 1 mg/dL (ref 0.0–1.0)

## 2010-07-23 LAB — TROPONIN I: Troponin I: 0.01 ng/mL (ref 0.00–0.06)

## 2010-07-23 LAB — URINE MICROSCOPIC-ADD ON

## 2010-07-24 LAB — COMPREHENSIVE METABOLIC PANEL
ALT: 16 U/L (ref 0–35)
AST: 20 U/L (ref 0–37)
Albumin: 4.3 g/dL (ref 3.5–5.2)
Alkaline Phosphatase: 55 U/L (ref 39–117)
BUN: 11 mg/dL (ref 6–23)
CO2: 32 mEq/L (ref 19–32)
Calcium: 9.7 mg/dL (ref 8.4–10.5)
Chloride: 100 mEq/L (ref 96–112)
Creatinine, Ser: 0.86 mg/dL (ref 0.4–1.2)
GFR calc Af Amer: >60 mL/min (ref >60)
GFR calc non Af Amer: >60 mL/min (ref >60)
Glucose, Bld: 85 mg/dL (ref 70–99)
Potassium: 3.5 mEq/L (ref 3.5–5.1)
Sodium: 139 mEq/L (ref 135–145)
Total Bilirubin: 0.9 mg/dL (ref 0.3–1.2)
Total Protein: 8.9 g/dL — ABNORMAL HIGH (ref 6.0–8.3)

## 2010-07-24 LAB — DIFFERENTIAL
Basophils Absolute: 0.1 10*3/uL (ref 0.0–0.1)
Basophils Relative: 1 % (ref 0–1)
Eosinophils Absolute: 0.4 10*3/uL (ref 0.0–0.7)
Eosinophils Relative: 6 % — ABNORMAL HIGH (ref 0–5)
Lymphocytes Relative: 20 % (ref 12–46)
Lymphs Abs: 1.6 10*3/uL (ref 0.7–4.0)
Monocytes Absolute: 0.4 10*3/uL (ref 0.1–1.0)
Monocytes Relative: 5 % (ref 3–12)
Neutro Abs: 5.5 10*3/uL (ref 1.7–7.7)
Neutrophils Relative %: 69 % (ref 43–77)

## 2010-07-24 LAB — URINALYSIS, ROUTINE W REFLEX MICROSCOPIC
Bilirubin Urine: NEGATIVE
Glucose, UA: NEGATIVE mg/dL
Hgb urine dipstick: NEGATIVE
Ketones, ur: NEGATIVE mg/dL
Nitrite: NEGATIVE
Protein, ur: NEGATIVE mg/dL
Specific Gravity, Urine: 1.016 (ref 1.005–1.030)
Urobilinogen, UA: 1 mg/dL (ref 0.0–1.0)
pH: 7.5 (ref 5.0–8.0)

## 2010-07-24 LAB — CBC
HCT: 37.5 % (ref 36.0–46.0)
Hemoglobin: 12.5 g/dL (ref 12.0–15.0)
MCHC: 33.4 g/dL (ref 30.0–36.0)
MCV: 86.6 fL (ref 78.0–100.0)
Platelets: 243 10*3/uL (ref 150–400)
RBC: 4.33 MIL/uL (ref 3.87–5.11)
RDW: 15.6 % — ABNORMAL HIGH (ref 11.5–15.5)
WBC: 8 10*3/uL (ref 4.0–10.5)

## 2010-07-24 LAB — POCT CARDIAC MARKERS
CKMB, poc: <1 ng/mL — ABNORMAL LOW (ref 1.0–8.0)
CKMB, poc: <1 ng/mL — ABNORMAL LOW (ref 1.0–8.0)
Myoglobin, poc: 48 ng/mL (ref 12–200)
Troponin i, poc: <0.05 ng/mL (ref 0.00–0.09)
Troponin i, poc: <0.05 ng/mL (ref 0.00–0.09)
Troponin i, poc: <0.05 ng/mL (ref 0.00–0.09)

## 2010-07-26 ENCOUNTER — Encounter: Payer: Self-pay | Admitting: Family Medicine

## 2010-07-26 ENCOUNTER — Emergency Department (HOSPITAL_COMMUNITY): Payer: Medicare Other

## 2010-07-26 ENCOUNTER — Observation Stay (HOSPITAL_COMMUNITY)
Admission: EM | Admit: 2010-07-26 | Discharge: 2010-07-27 | Disposition: A | Discharge status: Home / Self Care | Payer: Medicare Other | Attending: Family Medicine | Admitting: Family Medicine

## 2010-07-26 DIAGNOSIS — F411 Generalized anxiety disorder: Secondary | ICD-10-CM | POA: Insufficient documentation

## 2010-07-26 DIAGNOSIS — I2789 Other specified pulmonary heart diseases: Secondary | ICD-10-CM

## 2010-07-26 DIAGNOSIS — Z79899 Other long term (current) drug therapy: Secondary | ICD-10-CM | POA: Insufficient documentation

## 2010-07-26 DIAGNOSIS — D869 Sarcoidosis, unspecified: Secondary | ICD-10-CM | POA: Insufficient documentation

## 2010-07-26 DIAGNOSIS — K219 Gastro-esophageal reflux disease without esophagitis: Secondary | ICD-10-CM | POA: Insufficient documentation

## 2010-07-26 DIAGNOSIS — R0602 Shortness of breath: Secondary | ICD-10-CM | POA: Insufficient documentation

## 2010-07-26 DIAGNOSIS — T588X4A Toxic effect of carbon monoxide from other source, undetermined, initial encounter: Secondary | ICD-10-CM | POA: Insufficient documentation

## 2010-07-26 DIAGNOSIS — Y92009 Unspecified place in unspecified non-institutional (private) residence as the place of occurrence of the external cause: Secondary | ICD-10-CM | POA: Insufficient documentation

## 2010-07-26 DIAGNOSIS — R0609 Other forms of dyspnea: Principal | ICD-10-CM | POA: Insufficient documentation

## 2010-07-26 DIAGNOSIS — J069 Acute upper respiratory infection, unspecified: Secondary | ICD-10-CM | POA: Insufficient documentation

## 2010-07-26 DIAGNOSIS — Z9981 Dependence on supplemental oxygen: Secondary | ICD-10-CM | POA: Insufficient documentation

## 2010-07-26 DIAGNOSIS — Z7982 Long term (current) use of aspirin: Secondary | ICD-10-CM | POA: Insufficient documentation

## 2010-07-26 DIAGNOSIS — R51 Headache: Secondary | ICD-10-CM | POA: Insufficient documentation

## 2010-07-26 DIAGNOSIS — Z950 Presence of cardiac pacemaker: Secondary | ICD-10-CM | POA: Insufficient documentation

## 2010-07-26 DIAGNOSIS — J99 Respiratory disorders in diseases classified elsewhere: Secondary | ICD-10-CM | POA: Insufficient documentation

## 2010-07-26 DIAGNOSIS — I428 Other cardiomyopathies: Secondary | ICD-10-CM | POA: Insufficient documentation

## 2010-07-26 DIAGNOSIS — R0989 Other specified symptoms and signs involving the circulatory and respiratory systems: Principal | ICD-10-CM | POA: Insufficient documentation

## 2010-07-26 DIAGNOSIS — T5894XA Toxic effect of carbon monoxide from unspecified source, undetermined, initial encounter: Secondary | ICD-10-CM | POA: Insufficient documentation

## 2010-07-26 DIAGNOSIS — I509 Heart failure, unspecified: Secondary | ICD-10-CM | POA: Insufficient documentation

## 2010-07-26 LAB — DIFFERENTIAL
Eosinophils Relative: 6 % — ABNORMAL HIGH (ref 0–5)
Lymphocytes Relative: 23 % (ref 12–46)
Lymphs Abs: 1.8 10*3/uL (ref 0.7–4.0)

## 2010-07-26 LAB — CARBOXYHEMOGLOBIN: Methemoglobin: 0.6 % (ref 0.0–1.5)

## 2010-07-26 LAB — CBC
HCT: 36.7 % (ref 36.0–46.0)
MCV: 89.5 fL (ref 78.0–100.0)
RBC: 4.1 MIL/uL (ref 3.87–5.11)
RDW: 12.8 % (ref 11.5–15.5)
WBC: 7.7 10*3/uL (ref 4.0–10.5)

## 2010-07-26 LAB — BASIC METABOLIC PANEL
BUN: 17 mg/dL (ref 6–23)
CO2: 28 mEq/L (ref 19–32)
Chloride: 103 mEq/L (ref 96–112)
Glucose, Bld: 85 mg/dL (ref 70–99)
Potassium: 3.9 mEq/L (ref 3.5–5.1)

## 2010-07-26 NOTE — H&P (Addendum)
[facility] Admission History and Physical  Patient name: Rachel [name] Medical record number: [id number] Date of birth: [birth year] Age: 50 y.o. Gender: female  Primary Care Provider: Vic Blackbird, MD  Chief Complaint: HA and shortness of breath  History of Present Illness: Rachel [name] is a 50 y.o. year old female presenting with 3 day history of HA, SOB, productive cough, and lightheadedness.  Due to chronic lung disease, patient is on home O2 (5-6L) at baseline.  Recently, she has noticed dyspnea despite use of home O2 and called [facility] to evaluate her tank.  Tank was working fine.  Patient developed frontal headache 3 days ago.  Pain was 8/10 and improved with Motrin.  Additionally, patient c/o one episode of lightheadedness and nausea that woke up her from sleep.  Today patient's CO detector alarm started beeping for the first time.  She called EMS and fire dept who evacuated patient and family members from building for concern of gas leakage.  In ED, patient's carboxy-Hgb level was 2.0 and we were called to admit for O2 therapy and symptom control.  Patient Active Problem List  Diagnoses  . PULMONARY SARCOIDOSIS  . ANXIETY  . HYPERTENSION, BENIGN SYSTEMIC  . PULMONARY HYPERTENSION  . CARDIOMYOPATHY, IDIOPATHIC  . SINUS TACHYCARDIA  . CONGESTIVE HEART FAILURE, RIGHT  . UPPER RESPIRATORY INFECTION, VIRAL  . GASTROESOPHAGEAL REFLUX, NO ESOPHAGITIS  . HYPOXEMIA  . LOW BACK PAIN, ACUTE  . DYSURIA    Past Surgical History: S/p Pacemaker  Social History: Live at home with daughter and granddaughter.  On disability.  Former smoker >10 year history, but quit 20 years ago.  Denies any alcohol abuse.  Used to use THC, but not currently.   Family History: Mother - deceased, hx of DM, CHF, heart disease Father - deceased, heart disease  Allergies: Allergies  Allergen Reactions  . Doxycycline     REACTION: Hives  . Meperidine Hcl     REACTION:  Makes her feel funny  . Propoxyphene N-Acetaminophen     REACTION: rash: pruritus  . Tramadol Hcl     REACTION: nausea, lightheadedness, sleepiness, and dizziness    Current Outpatient Prescriptions  Medication Sig Dispense Refill  . acetaminophen (TYLENOL) 325 MG tablet Take 650 mg by mouth as needed.        Marland Kitchen albuterol (PROAIR HFA) 108 (90 BASE) MCG/ACT inhaler Inhale 2 puffs into the lungs 4 (four) times daily.  8.5 g  prn  . aspirin 81 MG tablet Take 81 mg by mouth daily.        . carvedilol (COREG) 3.125 MG tablet Take 3.125 mg by mouth 2 (two) times daily.        Marland Kitchen esomeprazole (NEXIUM) 40 MG capsule Take 1 capsule (40 mg total) by mouth daily before breakfast.  90 capsule  3  . fluticasone (FLOVENT HFA) 110 MCG/ACT inhaler Take one to two puffs two times a day       . furosemide (LASIX) 40 MG tablet 1 every other day      . Iloprost (VENTAVIS) 20 MCG/ML SOLN Inhale into the lungs 6 (six) times daily.        . potassium chloride SA (K-DUR,KLOR-CON) 20 MEQ tablet take two tabs in am and 1 tab in the pm       . risperiDONE (RISPERDAL) 0.5 MG tablet Take 0.5 mg by mouth at bedtime.        . sildenafil (REVATIO) 20 MG tablet take 4  tablets  every 8 hours        Review Of Systems: Per HPI with the following additions: Denies fever, chills, sweats.  Denies change in appetite, abdominal pain, constipation/diarrhea, dysuria.   Otherwise 12 point review of systems was performed and was unremarkable.  Physical Exam: Pulse: 64  Blood Pressure: 108/69 RR: 22   O2: 100 on NRB Temp: 98.4  General: alert, cooperative and no distress HEENT: PERRLA, extra ocular movement intact, oropharynx clear, no lesions and neck supple with midline trachea Heart: S1, S2 normal, no murmur, rub or gallop, regular rate and rhythm Lungs: unlabored breathing and few, scattered bibasilar wheezes Abdomen: abdomen is soft without significant tenderness, masses, organomegaly or guarding Extremities: edema  trace Skin:no rashes, no ecchymoses Neurology: mental status, speech normal, alert and oriented x3, cranial nerves 2-12 intact, muscle tone and strength normal and symmetric and sensation grossly normal  Labs and Imaging: Lab Results  Component Value Date/Time   NA 138 07/26/2010  1:04 PM   K 3.9 07/26/2010  1:04 PM   CL 103 07/26/2010  1:04 PM   CO2 28 07/26/2010  1:04 PM   BUN 17 07/26/2010  1:04 PM   CREATININE 0.75 07/26/2010  1:04 PM   GLUCOSE 85 07/26/2010  1:04 PM   Lab Results  Component Value Date   WBC 7.7 07/26/2010   HGB 11.6* 07/26/2010   HCT 36.7 07/26/2010   MCV 89.5 07/26/2010   PLT 222 07/26/2010   Co-oximetry: Carboxyhemoglobin level 2.0  CXR: severe chronic lung disease.  No superimposed acute findings   Assessment and Plan: Rachel [name] is a 50 y.o. year old female with significant hx of sarcoidosis, pulmonary HTN, and CHF who presents with dyspnea, HA, increased O2 requirement and CO exposure: 1. Dyspnea:  Likely multi-factorial in etiology.  May be secondary to URI or less likely CO poisoning.  Will admit for observation and monitor respiratory status.  Will start with 30% venti-mask and titrate to baseline home O2.  Will schedule Albuterol q 4 and start low dose Prednisone.  Will also start Doxycycline.  Will monitor on continuous pulse ox.   2.   Pulmonary HTN: patient is followed by pulmonologist and is on several medications.  Will continue with home meds and consider Pulmonology consult in am if respiratory status worsens.  Patient has not seen her specialist in over 1 year.   3.   CHF: Continue BB and home dose Lasix.  May consider adding ACE to maximize therapy.  Most recent 2D Echo (04/30/10) EF 55%. 4.   GERD: Will continue Nexium. 5.   Psych: Per ED note, patient has a hx of schizophrenia.  However, patient denies this and says she has anxiety.  Will continue Risperdal. 6.   FEN/GI:  HH diet.  SLIV. 7. Prophylaxis: SQ Heparin. 8. Disposition: pending clinical  improvement   Attending Admit note I saw the patient.  I agree with Dr Cori Razor Cruz's assessment and plan as documented in her admit note above. I agree that the dyspnea is less likely dur to acute Carbon Monoxide poisoning given the near normal level on ABG.  Agree with treating as COPD exacerbation and use slightly elevated FiO2 for a period to treat any possible Carbon Monoxide poisoning. Sherren Mocha McDiarmid, MD  07/27/10 at 0920 hrs

## 2010-07-27 LAB — CBC
HCT: 31.3 % — ABNORMAL LOW (ref 36.0–46.0)
Hemoglobin: 10.1 g/dL — ABNORMAL LOW (ref 12.0–15.0)
RBC: 3.57 MIL/uL — ABNORMAL LOW (ref 3.87–5.11)
WBC: 7.3 10*3/uL (ref 4.0–10.5)

## 2010-07-27 LAB — COMPREHENSIVE METABOLIC PANEL
ALT: 9 U/L (ref 0–35)
AST: 11 U/L (ref 0–37)
Alkaline Phosphatase: 45 U/L (ref 39–117)
CO2: 28 mEq/L (ref 19–32)
Glucose, Bld: 92 mg/dL (ref 70–99)
Potassium: 3.5 mEq/L (ref 3.5–5.1)
Sodium: 137 mEq/L (ref 135–145)
Total Bilirubin: 0.7 mg/dL (ref 0.3–1.2)

## 2010-08-03 ENCOUNTER — Ambulatory Visit (INDEPENDENT_AMBULATORY_CARE_PROVIDER_SITE_OTHER): Payer: Medicare Other | Admitting: Family Medicine

## 2010-08-03 VITALS — BP 121/83 | HR 96 | Ht 63.0 in | Wt 177.0 lb

## 2010-08-03 DIAGNOSIS — T5894XA Toxic effect of carbon monoxide from unspecified source, undetermined, initial encounter: Secondary | ICD-10-CM

## 2010-08-03 DIAGNOSIS — T5891XA Toxic effect of carbon monoxide from unspecified source, accidental (unintentional), initial encounter: Secondary | ICD-10-CM

## 2010-08-03 DIAGNOSIS — T588X4A Toxic effect of carbon monoxide from other source, undetermined, initial encounter: Secondary | ICD-10-CM

## 2010-08-03 DIAGNOSIS — J069 Acute upper respiratory infection, unspecified: Secondary | ICD-10-CM

## 2010-08-03 NOTE — Patient Instructions (Signed)
Continue your current meds If you have any concerns or if your headaches do not improve then come back to be seen.

## 2010-08-04 ENCOUNTER — Encounter: Payer: Self-pay | Admitting: Family Medicine

## 2010-08-04 DIAGNOSIS — T5891XA Toxic effect of carbon monoxide from unspecified source, accidental (unintentional), initial encounter: Secondary | ICD-10-CM | POA: Insufficient documentation

## 2010-08-04 DIAGNOSIS — J069 Acute upper respiratory infection, unspecified: Secondary | ICD-10-CM | POA: Insufficient documentation

## 2010-08-04 NOTE — Progress Notes (Signed)
  Subjective:    Patient ID: Rachel [name], female    DOB: November 22, 1960, 50 y.o.   MRN: [id number]  HPI Pt here for f/u for  CO poisoning and hopsital admission   Hospitalized for 24 hours for monitoring after CO exposure at home, pt had change in SOB, and oxygen requriment, also endorsed dizziness, fatigue an diarrhea. At one point her Oxygen was up to 7 liters, baseline 5-6 liters.  Per pt she has been feeling ill for approx 1 month, on the 19th, the CO  Monitor went off in the home, reading was elevated at 102. Pt was told her oxygen concentrator was contaminated and that she should seek medical care, EMS called  During admit she was treated with oxygen therapy above baseline, as well as doxycyline as well as low dose prednisone. Pt was covered with antibiotics as they were unclear if all symptoms were secondary to CO exposure or multifactorial with URI.  Few HA, no change in vision- HA improving, resolve with tylenol if needed, diarrhea improved, breathing at baseline, occ gets SOB  Note pt plans to go to an attorney regarding the CO leak, as she states she told her apart manager about this before Asked for a note- I advised her that the attorney needs to send something first  Review of Systems per above     Objective:   Physical Exam     GEN- NAD, alert and oriented x 3     HEENT- PERRL, EOMI, non icteric, oropharynx clear    Neck - Supple     CVS- RRR, normal S1, loud S2, no murmur    RESP- CTAB, no wheeze, no rhonchi, no crackles, normal WOB  ABD- Soft, NT, NABS     EXT- no edema      Neuro- moving all 4 ext normally, gait normal, mentation normal  Assessment & Plan:

## 2010-08-04 NOTE — Assessment & Plan Note (Signed)
Pt to complete antibiotic course today SOB and hypoxia resolved

## 2010-08-04 NOTE — Assessment & Plan Note (Signed)
CO2 exposure noted Symptoms continue to resolve, no neurological deficits, but pt continues to have some HA No red flags today, no change to routine care

## 2010-08-08 ENCOUNTER — Encounter: Payer: Self-pay | Admitting: Internal Medicine

## 2010-08-10 ENCOUNTER — Ambulatory Visit (INDEPENDENT_AMBULATORY_CARE_PROVIDER_SITE_OTHER): Payer: Medicare Other | Admitting: Internal Medicine

## 2010-08-10 ENCOUNTER — Encounter: Payer: Self-pay | Admitting: Internal Medicine

## 2010-08-10 VITALS — BP 136/82 | HR 93 | Ht 63.0 in | Wt 180.8 lb

## 2010-08-10 DIAGNOSIS — T588X4A Toxic effect of carbon monoxide from other source, undetermined, initial encounter: Secondary | ICD-10-CM

## 2010-08-10 DIAGNOSIS — T5894XA Toxic effect of carbon monoxide from unspecified source, undetermined, initial encounter: Secondary | ICD-10-CM

## 2010-08-10 DIAGNOSIS — D869 Sarcoidosis, unspecified: Secondary | ICD-10-CM

## 2010-08-10 DIAGNOSIS — I2789 Other specified pulmonary heart diseases: Secondary | ICD-10-CM

## 2010-08-10 DIAGNOSIS — R0902 Hypoxemia: Secondary | ICD-10-CM

## 2010-08-10 DIAGNOSIS — T5891XA Toxic effect of carbon monoxide from unspecified source, accidental (unintentional), initial encounter: Secondary | ICD-10-CM

## 2010-08-10 NOTE — Discharge Summary (Signed)
Rachel [name], Rachel [name] NO.:  [id number]  MEDICAL RECORD NO.:  [id number]           PATIENT TYPE:  O  LOCATION:  5503                         FACILITY:  [facility]  PHYSICIAN:  Blane Ohara Vinicius Brockman, M.D.DATE OF BIRTH:  07-16-1960  DATE OF ADMISSION:  07/26/2010 DATE OF DISCHARGE:  07/27/2010                              DISCHARGE SUMMARY   PRIMARY CARE PROVIDER:  Vic Blackbird, MD, at [facility].  DISCHARGE DIAGNOSES: 1. Dyspnea secondary to upper respiratory infection and carbon     monoxide exposure. 2. Pulmonary hypertension. 3. Hypertension. 4. Congestive heart failure. 5. Gastroesophageal reflux disease. 6. Anxiety. 7. Headache.  DISCHARGE MEDICATIONS: 1. Colace 100 mg 1 tablet by mouth twice daily as needed for     constipation. 2. Doxycycline 100 mg 1 tablet by mouth twice daily. 3. Aspirin 81 mg 1 tablet by mouth daily. 4. Coreg 3.125 mg 1 tablet by mouth twice daily. 5. Flovent 2 puffs inhale twice daily as needed for shortness of     breath. 6. Iloprost 20 mcg/mL 1 neb inhale 6 times a day. 7. Lasix 40 mg 1 tablet by mouth every other day. 8. Nexium 40 mg 1 tablet by mouth daily. 9. Potassium chloride 20 mEq 1-2 tablets by mouth twice daily. 10.ProAir inhaler 2 puffs inhale 4 times daily as needed for shortness     of breath. 11.Sildenafil 20 mg 4 tablets by mouth every 8 hours. 12.Risperdal 0.5 mg 1 tablet by mouth daily at bedtime. 13.Tylenol 325 mg 1 tablet by mouth every 4 hours as needed for pain.  CONSULTS:  None.  PROCEDURES:  On July 26, 2010, chest x-ray: Severe chronic lung disease.  No superimposed acute findings were identified.  PERTINENT LABORATORY FINDINGS:  At discharge; sodium 137, potassium 3.5, chloride 104, CO2 28, BUN 13, creatinine 0.79 and glucose 92.  White count 7.3, hemoglobin 10.1, hematocrit 31.3 and platelets 211. Carboxyhemoglobin level 2.0.  BRIEF HOSPITAL COURSE:  This is a 50 year old  female with a history of sarcoidosis and pulmonary hypertension and a home oxygen requirement of 5-6 L who presents with 3-day history of headache, shortness of breath, nausea, cough and lightheadedness.  In the past few days, the patient noticed that she was requiring more oxygen than baseline.  The patient's carbon monoxide detector went off in her apartment.  She called EMS and fire department and was evacuated from her apartment building.  She presented to the emergency department and was found to have a carboxyhemoglobin level of 2.0.  The patient was started on 15 L of nonrebreather and we were called to admit for oxygen therapy and symptom control. 1. Dyspnea secondary to upper respiratory infection trigger and/or     carbon monoxide exposure.  The patient was transitioned to 30%     Ventimask throughout the night and was weaned to 5-6 L of nasal     cannula.  On hospital day #2, we continued the patient's     medications of Ventavis and albuterol nebs and sildenafil for her     chronic lung diseases.  We started the  patient on low dose of     prednisone and doxycycline.  On day of discharge, the patient's     dyspnea had resolved.  She was not working hard to breathe.  She     was not requiring any supplemental oxygen.  The patient was advised     to follow up with her pulmonologist and cardiologist for her     chronic diseases. 2. Pulmonary hypertension.  We continued the patient's home meds of     Ventavis and sildenafil. 3. Hypertension.  Blood pressure was currently stable on her home meds     of Coreg 3.125 twice daily. 4. Congestive heart failure.  The patient's last 2-D echo was in     January 2012.  Her EF was 55%.  The patient has diagnosis of right-     sided heart failure and sees Dr. Marlou Porch.  The patient is currently     on a beta-blocker and Lasix for CHF therapy. 5. Gastroesophageal reflux disease.  The patient was continued on her     Nexium and remained  asymptomatic throughout the course. 6. Anxiety.  Per ED notes, the patient has a history of schizophrenia,     however, when I mentioned this to the patient, she became very     defensive and denied any diagnosis of schizophrenia.  She does     state that she has generalized anxiety and difficulty sleeping,     therefore she was started on Risperdal in the past.  We continued     the patient's Risperdal.  DISCHARGE INSTRUCTIONS:  ACTIVITIES:  No restrictions.  DIET:  Low-sodium heart-healthy.  WOUND CARE:  Not applicable.  Please return to Dr. Vic Blackbird at [facility] Friday August 03, 2010, at 11 o'clock a.m.  SPECIAL INSTRUCTIONS: 1. Avoid straining. 2. Stop any activity that causes chest pain, shortness of breath,     dizziness, sweating or excessive weakness. 3. Please follow up with Dr. Marlou Porch, cardiologist and her     pulmonologist.  DISCHARGE CONDITION:  The patient was discharged to home in stable medical condition.    ______________________________ Donnamarie Rossetti, MD   ______________________________ Blane Ohara Griffon Herberg, M.D.    ID/MEDQ  D:  07/28/2010  T:  07/29/2010  Job:  [id number]  Electronically Signed by Donnamarie Rossetti MD on 08/08/2010 06:43:30 PM Electronically Signed by Lissa Morales M.D. on 08/10/2010 11:27:51 AM

## 2010-08-10 NOTE — Patient Instructions (Addendum)
I can send a note about carbon monoxide exposure- done

## 2010-08-10 NOTE — H&P (Signed)
NAME:  [name], [name] NO.:  [id number]  MEDICAL RECORD NO.:  [id number]           PATIENT TYPE:  O  LOCATION:  5409                         FACILITY:  [facility]  PHYSICIAN:  Blane Ohara Dominique Calvey, M.D.DATE OF BIRTH:  1960-09-05  DATE OF ADMISSION:  07/26/2010 DATE OF DISCHARGE:                             HISTORY & PHYSICAL   PRIMARY CARE PROVIDER:  Vic Blackbird, MD at [facility].  CHIEF COMPLAINT:  Headache and shortness of breath.  HISTORY OF PRESENT ILLNESS:  Rachel [name] is a 50 year old female presenting with 3-day history of headache, shortness of breath, productive cough and lightheadedness, due to chronic lung disease, the patient on home O2 of 5-6 liters at baseline.  Recently, she has noticed dyspnea despite the use of home O2 and called [facility] to evaluate her tank, tank is working fine.  The patient developed frontal headache 3 days ago, pain was in 8/10 and improved with Motrin. Additionally, the patient complains of one episode of lightheadedness and nausea that woke her up from sleep.  Today, the patient's carbon monoxide detector alarm started beeping for the first time.  She called EMS and the fire department, who evacuated the patient and family members from building for concern of  gas leakage.  In the ED, the patient's carboxyhemoglobin level was 2.0 and we were called to admit for O2 therapy and symptom control.  PAST MEDICAL HISTORY: 1. Pulmonary sarcoidosis. 2. Anxiety. 3. Hypertension. 4. Pulmonary hypertension. 5. Idiopathic cardiomyopathy. 6. Congestion give heart failure, diastolic 7. GERD. 8. Hypoxemia.  PAST SURGICAL HISTORY:  Status post pacemaker,the patient cannot remember what year.  SOCIAL HISTORY:  She lives at home with her daughter and granddaughter. She is on disability.  She is a former smoker, quit about 20 years ago. She denies any alcohol abuse and she used to use marijuana,but  not currently.  FAMILY HISTORY:  Mother deceased with history of diabetes, CHF and heart disease.  Father is also deceased with a history of heart disease.  ALLERGIES:  MEPERIDINE, TRAMADOL, PROPOXYPHENE, ACETAMINOPHEN, DOXYCYCLINE FOR HIVES, LATEX, HIGH-DOSE STEROIDS, DEMEROL.  CURRENT OUTPATIENT MEDICATIONS: 1. Tylenol 650 mg by mouth every 6 hours as needed for pain. 2. Albuterol inhaler 2 puffs into the lungs every 4 times daily. 3. Aspirin 81 mg 1 tablet by mouth daily. 4. Coreg 3.125 mg 1 tablet by mouth twice daily. 5. Nexium 40 one capsule by mouth daily before breakfast. 6. Flovent 1-2 puffs two times a day. 7. Lasix 40 mg 1 tablet every other day. 8. Ventavis inhaled into the lungs 6 times daily. 9. K-Dur 20 mEq 2 tablets in the morning and 1 in the evening. 10.Risperidone 0.5 mg 1 tablet by mouth at bedtime. 11.Sildenafil 20 mg 4 tablets every 8 hours.  REVIEW OF SYSTEMS:  Per HPI with the following additions:  Denies fever, chills, night sweats.  Denies changes in appetite, abdominal pain, constipation, diarrhea, or dysuria.  PHYSICAL EXAMINATION:  VITAL SIGNS:  Pulse 64, her temperature 98.4, blood pressure was 108/69, respirations 22, O2 sat 100% on non- rebreather 15 liters. GENERAL:  Alert, cooperative and in no distress. HEENT:  PERRLA.  EOMI.  Oropharynx is clear.  No lesions. NECK:  Supple with midline trachea. HEART:  Regular rate and rhythm.  No murmur or rubs or gallops. LUNGS:  Unlabored breathing with few scattered bibasilar wheezes. ABDOMEN:  Soft without significant tenderness, masses, organomegaly or guarding. EXTREMITIES:  Trace edema bilaterally. SKIN:  No rashes or ecchymosis. NEUROLOGIC:  Mental status and speech are normal.  She is alert and oriented x3.  Cranial nerves II-XII intact.  Muscle tone and strength normal, and sensation is symmetric and grossly normal.  LABS AND IMAGING:  Sodium 138, potassium 3.9, chloride 103, CO2 28, BUN 17,  creatinine 0.75, glucose 85.  CBC, white count 7.7, hemoglobin 11.6, hematocrit 36.7 and platelets 222,000.  Cooximetry showed a carboxyhemoglobin level of 2.0.  Chest x-ray showed severe chronic lung disease.  No superimposed acute findings.  ASSESSMENT/PLAN:  Rachel [name] is a 50 year old female with significant history of sarcoidosis, pulmonary hypertension and congestive heart failure, who presents with dyspnea, headache, increased O2 requirement and carbon monoxide exposure. 1. Dyspnea.  Likely multifactorial in etiology may be secondary to URI     or less likely carbon monoxide poisoning.  We will admit the     patient for observation and monitor respiratory status.  We will     start 30% of Ventimask and hydrate based on home O2.  We will     schedule albuterol q.4 and start low-dose prednisone.  We will also     start doxycycline.  We will monitor on continuous pulse ox. 2. Pulmonary hypertension.  The patient is followed by pulmonologist     and is on several medications.  We will continue with home meds and     consider Pulmonology consult in the morning, if respiratory status     worsens.  The patient has not seen her specialists over a year. 3. Congestive heart failure, diastolic.  Continue beta blockers and     home dose Lasix.  May consider adding ACE inhibitor to maximize     therapy.  Most recent 2-D echo in January 2012, EF 55%. 4. Gastroesophageal reflux disease.  We will continue Nexium. 5. Psych.  Per ED note, the patient has a history of schizophrenia,     however, the patient denies this and so she has anxiety.  We will     continue her Risperdal. 6. FEN/GI:  Heart-healthy diet, saline lock IV. 7. Prophylaxis.  Heparin subcu.  DISPOSITION:  Pending clinical improvement.    ______________________________ Donnamarie Rossetti, MD   ______________________________ Blane Ohara Lyndol Vanderheiden, M.D.    ID/MEDQ  D:  07/26/2010  T:  07/27/2010  Job:  [id number]  Electronically  Signed by Donnamarie Rossetti MD on 08/08/2010 06:43:43 PM Electronically Signed by Lissa Morales M.D. on 08/10/2010 11:27:45 AM

## 2010-08-10 NOTE — Progress Notes (Signed)
  Subjective:    Patient ID: Rachel [name], female    DOB: 06-11-1960, 50 y.o.   MRN: [id number]  HPI 28 yoF with hx sarcoid cardiomyopathy, right heart failure, pulmonary hypertension, panic/ anxiety and GERD. Here with family. Last here March 07, 2010. Since then reports carbon monoxide exposure from a heater in her apartment. Had more SOB, headaches, stomach cramp. Confirmed by home visit- April 14 fire Department. Had to switch out her O2 concentrator. She felt better using Ventavis and Sildenafil  before, but now coughing. She was admitted at [facility] and treated with doxycycline after this exposure..  She asks a letter stating CO exposure was dangerous at the time. I explained that I wouldn't expect long-term consequences- the danger was at the time of the exposure and for the next day or 2 as hemoglobin function returned. She remains chronically oxygen dependent and with exercise tolerance limited to walking length of hall. .  CXR 07/26/10- severe stage IV sarcoid scarring with honeycombing  Review of Systems See HPI Constitutional:   No weight loss, night sweats,  Fevers, chills, fatigue, lassitude. HEENT:   No headaches,  Difficulty swallowing,  Tooth/dental problems,  Sore throat,                No sneezing, itching, ear ache, nasal congestion, post nasal drip,   CV:  No chest pain,  Orthopnea, PND, swelling in lower extremities, anasarca, dizziness, palpitations  GI  No heartburn, indigestion, abdominal pain, nausea, vomiting, diarrhea, change in bowel habits, loss of appetite  Resp: No shortness of breath with exertion or at rest.  No excess mucus, no productive cough,  No non-productive cough,  No coughing up of blood.  No change in color of mucus.  No wheezing.  No chest wall deformity  Skin: no rash or lesions.  GU: no dysuria, change in color of urine, no urgency or frequency.  No flank pain.  MS:  No joint pain or swelling.  No decreased range of motion.  No back pain.  Psych:   No change in mood or affect. No depression or anxiety.  No memory loss.      Objective:   Physical Exam O2 5-6 L/M Portable tank   General- Alert, Oriented, Affect-appropriate, Distress- none acute  Depressed looking  Skin- rash-none, lesions- none, excoriation- none  Lymphadenopathy- none  Head- atraumatic  Eyes- Gross vision intact, PERRLA, conjunctivae clear, secretions  Ears- Normal- Hearing, canals, Tm Nose- Clear, Septal dev, mucus, polyps, erosion, perforation   Throat- Mallampati II , mucosa clear , drainage- none, tonsils- atrophic  Neck- flexible , trachea midline, no stridor , thyroid nl, carotid no bruit  Chest - symmetrical excursion , unlabored     Heart/CV- RRR , no murmur , no gallop  , no rub, nl s1 s2                     - JVD- none , edema- none, stasis changes- none, varices- none     Lung-Very distant with crackles.   No wheeze- none, cough- none , dullness-none, rub- none     Chest wall-  Abd- tender-no, distended-no, bowel sounds-present, HSM- no  Br/ Gen/ Rectal- Not done, not indicated  Extrem- cyanosis- none, clubbing, none, atrophy- none, strength- nl  Neuro- grossly intact to observation         Assessment & Plan:

## 2010-08-15 ENCOUNTER — Encounter: Payer: Self-pay | Admitting: Internal Medicine

## 2010-08-15 NOTE — Assessment & Plan Note (Signed)
Severe stage IV scarring but no longer active inflammatory sarcoid.

## 2010-08-15 NOTE — Assessment & Plan Note (Signed)
Hosp DC summary reviewed from [facility] 4/19- 07/27/10 attributed dyspnea to URI and CO exposure after admission CO level of 2.0. I explained that all residual effects should be resolved by now. The hypoxic burden should be gone.

## 2010-08-15 NOTE — Assessment & Plan Note (Signed)
Chronic hypoxic respiratory failure.

## 2010-08-16 NOTE — Letter (Signed)
Aug 15, 2010   Cleva Camero [address] [place], [place] [address]  RE:  TAITE, BALDASSARI MRN:  [id number]  /  DOB:  1961-02-07  To whom it may concern:  Jahmiyah Dullea has been under my medical care for severe chronic lung disease complicated by severe chronic heart disease.  She was hospitalized April 19 through July 27, 2010, after presenting with headache, shortness of breath, nausea, cough, and lightheadedness.  The carbon monoxide detector had gone off in her apartment.  EMS and the fire department responded and she was found to have an elevated carbon monoxide level (carboxyhemoglobin) level of 2, indicating increased exposure above safe levels.  She required hospitalization and treatment with high-dose oxygen to drive the carbon monoxide off of her red blood cells.  Carbon monoxide binds the hemoglobin and prevents it from carrying oxygen even though she is breathing and her heart is beating. This woman is already severely limited by her impaired heart and lung function.  Exposure to elevated carbon monoxide levels represented a real and significant threat to her health during that time period of exposure.  I do not expect lasting consequences.   Sincerely,     Clinton D. Annamaria Boots, MD, [facility] Flood, FACP Electronically Signed   CDY/MedQ  DD: 08/15/2010  DT: 08/16/2010  Job #: [id number]

## 2010-08-21 NOTE — H&P (Signed)
NAME:  Rachel [name], Rachel [name] NO.:  [id number]   MEDICAL RECORD NO.:  [id number]          PATIENT TYPE:  INP   LOCATION:  4702                         FACILITY:  [facility]   PHYSICIAN:  Dalbert Mayotte, MD        DATE OF BIRTH:  Jun 26, 1960   DATE OF ADMISSION:  04/11/2008  DATE OF DISCHARGE:                              HISTORY & PHYSICAL   PRIMARY CARE Wylder Macomber:  Thompson Grayer, MD at [facility].   CHIEF COMPLAINT:  Shortness of breath.   HISTORY OF PRESENT ILLNESS:  The patient is a 50 year old female with  history of class IV sarcoidosis with pulmonary and cardiac  manifestations who presents with worsening shortness of breath.  The  patient states the symptoms have been delving for a while, extremely  worse yesterday.  She was too dyspneic to complete ADLs.  Had chest  pain, myalgias, chills, and sweats, productive cough with purulent  sputum.  The patient also complains of headache and left hand, arm, and  leg numbness and tingling.  The headache began yesterday as well.  It is  not relieved by Naprosyn.  Denies slurred speech, difficulty walking,  bowel or bladder incontinence, and syncope.   MEDICATIONS:  1. Albuterol 2 puffs q.4-6 h. p.r.n. shortness of breath.  2. Calcium 500 mg p.o. daily.  3. Coreg 6.25 mg p.o. b.i.d.  4. Lasix 40 mg p.o. b.i.d.  5. Nasonex 50 mcg two sprays each nostril daily p.r.n.  6. Nexium 40 mg p.o. daily.  7. Risperdal 0.5 mg p.o. daily.  8. Potassium chloride 20 mEq p.o. daily.  9. Flovent 110 mcg, 1-2 puffs b.i.d.  10.Benicar 5 mg p.o. daily.   ALLERGIES:  DEMEROL.   PAST MEDICAL HISTORY:  1. Allergic rhinitis.  2. Class IV sarcoidosis with significant pulmonary scarring and      cardiomyopathy.  3. Gastroesophageal reflux disease.  4. History of chest wall pain.  5. History of iron deficiency anemia.  6. Congestive heart failure.  7. History of nonsustained V tach and syncope status post AICD  placement.  8. Schizophrenia.   FAMILY HISTORY:  Father with lung cancer, mother with diabetes and  hypertension, congestive heart failure.   SOCIAL HISTORY:  The patient denies alcohol, drug, or tobacco, currently  lives with her daughter.   REVIEW OF SYSTEMS:  As per HPI.   PHYSICAL EXAMINATION:  VITAL SIGNS:  Oxygen saturation 91% on 4 L,  temperature 96.6, heart rate 110, respiratory rate 22, blood pressure  160/119.  GENERAL:  Alert, well developed, and well nourished.  HEENT:  Extraocular movements intact.  Pupils equal, round, and reactive  to light and accommodation with no conjunctival injection.  Hearing is  grossly normal, moist mucosa.  NECK:  Supple.  No thyromegaly.  RESPIRATORY:  Mild tenderness to palpation of chest wall.  The patient  is speaking in full sentences.  Mildly increased work of breathing with  no accessory muscle use.  Diffuse rhonchi.  CARDIOVASCULAR:  Tachycardic.  Regular rhythm.  No murmurs, rubs, or  gallops.  ABDOMEN:  Positive bowel sounds.  Abdomen soft, nontender without  masses.  No rebound, no guarding.  EXTREMITIES:  No peripheral edema or cyanosis.  SKIN: No cyanosis, jaundice, or rashes.  NEUROLOGIC:  Nonfocal.  Cranial nerves II through XII grossly intact.  PSYCHIATRIC:  Oriented x3.  Flat affect.  The patient does not appear to  be responding to external stimuli or seeing things.   LABORATORY DATA:  Chest x-ray with diffuse intraparenchymal disease with  some progression to the  upper lobes.  The patient has a nodular density  in left upper lobe on today's chest x-ray.  Next, BNP 22, white blood  cell count 9.8, hemoglobin 11.7, hematocrit 35.7, platelets 265, sodium  141, potassium 4.2, chloride 105, glucose 87, BUN 13, creatinine 0.9,  cardiac markers negative times one.   ASSESSMENT AND PLAN:  The patient is a 50 year old female with stage IV  sarcoidosis who presents with worsening shortness of breath.  1. Dyspnea:  The  patient with oxygen saturation in the high 80s and      low 90s  on 4 L.  Worsening on chest x-ray.  Possible worsening of      sarcoidosis versus underlying infection.  We will continue      albuterol and Atrovent nebs.  We will place the patient on      doxycycline.  We will avoid steroids as the patient has a history      of steroid-induced psychosis.  We will rule out coronary syndrome      with serial cardiac markers.  Repeat EKG in a.m.  We will check CT      angiogram of the chest to rule out pulmonary embolism and evaluate      lung nodule.  We will titrate up the oxygen as needed.  2. Idiopathic cardiomyopathy:  The patient has AICD.  BNP is elevated,      but upon review of medical records seems at baseline.  We will      monitor her on telemetry.  We will continue Lasix, Benicar, and      Coreg.  3. Hypertension:  Continue home meds.  May need increase of Benicar if      hypertension persists.  4. Schizophrenia:  Continue Risperdal and avoid steroids  Heart      healthy diet, saline lock IV fluids.  5. Prophylaxis:  Protonix and heparin.  6. Disposition:  Pending clinical improvement.      Sherrell Puller, MD  Electronically Signed      Dalbert Mayotte, MD  Electronically Signed   TCB/MEDQ  D:  04/13/2008  T:  04/14/2008  Job:  [id number]

## 2010-08-21 NOTE — Consult Note (Signed)
NAMEKORRIN, WATERFIELD NO.:  [id number]   MEDICAL RECORD NO.:  [id number]          PATIENT TYPE:  INP   LOCATION:  5784                         FACILITY:  [facility]   PHYSICIAN:  Doree Albee, M.D.DATE OF BIRTH:  1960-07-28   DATE OF CONSULTATION:  04/22/2008  DATE OF DISCHARGE:                                 CONSULTATION   REFERRING PHYSICIAN:  Dalbert Mayotte, MD   CONSULTING PHYSICIAN:  Doree Albee, MD   REASON FOR CONSULTATION:  To establish goals of care.   IMPRESSION:  1. Dyspnea - secondary to stage IV sarcoidosis with concurrent recent      acute bronchitis and also a history of congestive heart failure,      which is left ventricular systolic and right ventricular systolic      dysfunction.  2. Palliative performance score 50%.   RECOMMENDATIONS:  1. The patient is not hospice eligible.  2. I would suggest that the patient pursue the possibility of a heart-      lung transplant.   HISTORY:  This 50 year old lady was admitted over about 11 days ago with  dyspnea and the workup so far has shown the possibility of bronchitis  inducing the symptoms in the setting of stage IV sarcoidosis.  Her  pulmonologist, [name] agrees with this assessment and the  patient has been improving throughout her hospitalization.  She requires  home oxygen and currently on 6 liters per minute.  She tells me that  while she is at home in reasonably good condition, she is fully  functional and able to carry out all activities of daily living albeit  with dyspnea.  In fact, she keeps her grandchild for 4 hours every day  whilst her daughter is at school.   Past medical history is significant for:  1. Sarcoidosis stage IV as mentioned above.  2. History of congestive heart failure with ejection fraction of 40-      45% and also pulmonary hypertension and clinical syndrome of cor      pulmonale.  3. Possible diagnosis of schizophrenia, although there is a  history of      sarcoid-induced psychosis.   FAMILY HISTORY:  Noncontributory.   REVIEW OF SYSTEMS:  The patient apart from the dyspnea denies any other  symptoms referable.  A 12 systems reviewed.   ALLERGIES:  DEMEROL.   SOCIAL HISTORY:  The patient lives with her daughter and granddaughter.   PHYSICAL EXAMINATION:  The patient is afebrile, hemodynamically stable,  saturating 97% on 6 liters of oxygen.  Heart sounds are present and  normal.  Lung fields are entirely clear.  There is some peripheral  pitting edema indicative of right-sided heart failure.  Abdomen is soft  and nontender with no hepatosplenomegaly.  Neurologically, she is alert  and oriented without any focal neurologic signs.   RELEVANT DATA:  Creatinine 1.0, albumin 2.7, hemoglobin 9.7, white blood  cell count 8.2, platelets 253.  Chest x-ray very impressive for changes  consistent with burnt-out sarcoid.   DISCUSSION:  This 50 year old lady is functioning  in good state, and I  do not believe she is hospice eligible at this point and that the  recommendation would be to pursue the possibility of heart-lung  transplant, which might prolong her life.      Doree Albee, M.D.  Electronically Signed     NCG/MEDQ  D:  04/22/2008  T:  04/23/2008  Job:  [id number]

## 2010-08-21 NOTE — Discharge Summary (Signed)
Rachel [name], RICKLEFS NO.:  [id number]   MEDICAL RECORD NO.:  [id number]          PATIENT TYPE:  INP   LOCATION:  4705                         FACILITY:  [facility]   PHYSICIAN:  Blane Ohara McDiarmid, M.D.DATE OF BIRTH:  [birth year]   DATE OF ADMISSION:  08/22/2008  DATE OF DISCHARGE:  08/30/2008                               DISCHARGE SUMMARY   DISCHARGE DIAGNOSES:  1. Stage IV pulmonary sarcoidosis, baseline 6 liters oxygen.  2. Class III pulmonary hypertension.  3. Hypertension.  4. Schizophrenia.  5. Right heart failure, ejection fraction of 55%.  6. Fatigue.  7. Anemia.  8. Nonischemic cardiomyopathy.  9. Panic attacks.  10.Anxiety.  11.Sciatica.  12.Status post implantable cardioverter-defibrillator.  13.Gastroesophageal reflux disease.  14.History of back pain.  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  17.De Quervain tenosynovitis.   DISCHARGE MEDICATIONS:  1. Coreg 3.125 mg p.o. b.i.d.  2. Flonase 0.05% nasal spray 2 sprays daily.  3. Flovent 110 mcg 2 puffs twice a day.  4. Risperdal 0.25 mg at bedtime.  5. Albuterol 90 mcg inhaler 2 puffs q.4-6 h p.r.n. shortness of      breath.  6. Nexium 40 mg p.o. daily.  7. Ibuprofen 600 mg p.o. q.6 h p.r.n. pain.  8. Sildenafil 20 mg p.o. t.i.d.  9. Home oxygen 5-6 liters O2 saturations 87-93%.   DISCONTINUED MEDICATIONS:  Benicar, Lasix, nystatin, and Coreg 12.5 mg.   CONSULTS:  [facility] as the patient's previous cardiologist had not      seen the patient in greater than 2 years, therefore denied      admission.  2. Physical therapy.   PROCEDURES:  Right and left heart catheterization, Aug 26, 2008.  Please  see complete OR report.  Impression:  Pulmonary hypertension moderate to  severe, normal coronary artery, preserved ejection fraction of 55%.   IMAGING:  1. Echocardiogram, Aug 24, 2008, impression:  EF 30-35%, diffuse      hypokinesis, mitral regurgitation,  mildly dilated right ventricle,      septum both from right to left consistent with increased right      atrial pressure.  Please see complete echo report.  2. Chest x-ray, Aug 23, 2008, impression:  No active disease, stable      pulmonary fibrosis, cardiomegaly with pacer.   DISCHARGE LABORATORY DATA:  Vitamin D pending.  Blood gas, Aug 26, 2008;  pH 7.446, bicarb 32, and CO2 of 46.  Potassium 4.1, BUN 9, and  creatinine 0.96.  Hemoglobin 11.8, hematocrit 35.3, and platelets 319.  BNP 563.  TSH 1.609.  Cardiac enzymes negative x3.   BRIEF HOSPITAL COURSE:  A 50 year old female with end-stage sarcoidosis,  previously discharged from [facility] with diagnosis of  acute CHF exacerbation as well as anxiety component.  During that  admission, the patient was diuresed gently.  Physical therapy was  consulted as well as the patient's EP physician in which the patient's  cardiac pacer did not need to have any further intervention.  Cardiology  was consulted informally by phone and with negative enzymes  and stable  EKG, the patient was allowed to be discharged home without any further  intervention.  The patient was readmitted with shortness of breath as  well as hypoxia noted by the patient's home nurse aide.  The patient was  found to have elevated BNP in the 500s with initial exam concerning for  CHF exacerbation.  1. Right heart failure, CHF exacerbation.  The patient with elevated      BNP as per above was admitted and placed on telemetry.  Cardiology      was consulted who recommended aggressive diuresis initially with IV      Lasix q.8 h.  The patient had an echocardiogram which showed an EF      of 35%, therefore, Coreg was decreased to 3.125 and Benicar was      discontinued.  The patient was also started on hydralazine IV q.8 h      to help with after load reduction.  The patient diuresed well      approximately 6 liters.  This patient has had unclear reason for       congestive heart failure of known chronic pulmonary disease.      Echocardiogram suggested increased pulmonary arterial pressures.      The patient underwent right and left heart catheterization which      did not reveal any coronary artery disease, however, showed      increased right atrial pressures and pulmonary hypertension in the      moderate to severe category.  Status post diuresis so the patient      was at dry weight.  The patient was started on sildenafil 20 mg      t.i.d. which she tolerated prior to discharge.  Of note at      discharge, the patient was at baseline dry weight of 74.5 kg.  The      patient was discharged home on Coreg at lower dose 3.125 as blood      pressures did not tolerate large doses of antihypertensives.  The      patient was not discharged on Lasix or Benicar.  The patient will      follow up with Cardiology in approximately 2 weeks to assess fluid      status and will also have repeat echocardiogram in 2-3 months.  The      patient will follow up with [name] in [facility] regarding pulmonary      hypertension.  2. Pulmonary hypertension.  Per above, pulmonary hypertension      diagnosed via echocardiogram and right heart catheterization.  The      patient was started on sildenafil and tolerated well at discharge.      Of note, it had to be explained to the patient on numerous      occasions that sildenafil was an initial medication as a trial to      help with symptoms regarding shortness of breath with pulmonary      hypertension.  However, it should be noticed that lung transplant      will provide ultimate cessation of all symptoms.  The patient had      initial screening for lung transplant at [facility] which was scheduled      for June 17.  3. Hypertension.  The patient was previously on Coreg and Benicar for      hypertension.  Benicar was discontinued on last hospitalization,      however, I think the patient continued  to take.  The patient had       low blood pressures with systolics into the low 99M.  Therefore,      this was held.  Coreg was also decreased as per above.  The patient      also felt lightheaded and dizzy while on hydralazine and Coreg.      After hydralazine was discontinued, the patient's symptoms did      resolved and blood pressures were in the low 110s prior to      discharge.  4. Schizophrenia.  The patient was continued on home dose of      Risperdal.  5. Anemia.  No further workup was needed.  6. Panic attacks.  The patient has a lot of anxiety and panic in      reference to her medical care and her state of health.  Oftentimes,      she had to be reassured on multiple occasions regarding her      medications and dosing and actual symptoms.  It was noted that the      patient had numerous complaints throughout admission, most of which      physical exam did not show any abnormalities.  The patient had to      be reassured and this appeared to work initially.  Of note,      hydroxyzine was tried for the patient for anxiety.  However, she      felt that she was too drowsy with medication on board and did not      feel using daily.  Therefore, this was discontinued prior to      discharge.   ISSUES FOR FOLLOWUP:  1. The patient is to follow up with [facility] for initial screening.  2. Toleration of sildenafil.  3. Echocardiogram in 2-3 months.   DISCHARGE INSTRUCTIONS:  Low-sodium diet secondary to history of CHF.  The patient is to walk with walker and cane.  Continue physical therapy.   FOLLOWUP APPOINTMENTS:  [name], May 28, 2:00 p.m.  Dr. Aundra Dubin, June  16, 11:45 a.m.  [facility], June 17, 10:55 a.m.   DISCHARGE CONDITION:  Stable/improved.   DISCHARGE LOCATION:  Home.      Vic Blackbird, MD  Electronically Signed      Blane Ohara McDiarmid, M.D.  Electronically Signed    KD/MEDQ  D:  09/01/2008  T:  09/01/2008  Job:  [id number]   cc:   [name]  Dr. Aundra Dubin

## 2010-08-21 NOTE — H&P (Signed)
[name], [name] NO.:  [id number]   MEDICAL RECORD NO.:  [id number]           PATIENT TYPE:  INP   LOCATION:  6578                         FACILITY:  [facility]   PHYSICIAN:  Blane Ohara McDiarmid, M.D.DATE OF BIRTH:  1960-08-27   DATE OF ADMISSION:  12/08/2008  DATE OF DISCHARGE:                              HISTORY & PHYSICAL   __________   PRIMARY CARE PHYSICIAN:  __________   CHIEF COMPLAINT:  Chest pain/shortness of breath.   HISTORY OF PRESENT ILLNESS:  This is a 50 year old female with stage IV  sarcoidosis on 5-6 liters home O2 oxygen with 2 days of chest tightness,  shortness of breath pain in the posterior back, left-sided flank and rib  pain, in addition to chest congestion.  The patient describes the pain  as being associated with breathing and sharp with radiation to the left  back and lower ribs.  The patient denies any pain radiating to the  central chest, neck or left arm.  The patient does report being short of  breath while both at rest and with exertion.  The patient rates the pain  a 09/10 with pain being an 8/10 status post fentanyl administration in  the emergency department. The patient stated that she was deviating from  her baseline prior to coming to the admission. Of note that the patient  has had numerous admissions for shortness of breath, given her stage IV  sarcoidosis with very similar symptomatology. with the patient having a  very high insight into her overall baseline in terms of a pulmonary  status.  The patient states that what made her very concerned was her  using her maximum dosage of p.r.n. albuterol with yet no improvement and  pulmonary function.  Of note, the patient is also being treated for  anxiety with Ativan/ Being that the patient has noted per her and  through her primary care physician that anxiety has played a role in her  having increased work of breathing.  The patient upon interview in the  ED was initially  put on 15 liters of face mask oxygen with oxygen  saturating around 99%.  While interviewing the patient, the patient was  placed back on her home O2 levels of 5-6 liters nasal cannula with the  patient having no increased work of breathing as well as the patient  sating in the mid 90s while the interview took place.   PAST MEDICAL HISTORY:  1. Pulmonary sarcoidosis with home O2, requiring 6 liters of oxygen.  2. Schizophrenia.  3. Sciatica.  4. Fatigue.  5. Anemia.  6. Somatization disorder.  7. Allergic rhinitis.  8. Panic attacks.  9. Hypertension.  10.CHF with an EF of 55%.  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  12.Anxiety.   MEDICATIONS:  1. Albuterol 2 puffs q. 4 to 6 p.r.n.  2. Coreg 6.25 mg p.o. daily.  3. Nasonex 50 mcg 2 sprays daily in each nostril.  4. Nexium 40 mg p.o. daily.  5. Risperdal 0.25 mg p.o. q.h.s.  6. Flovent 110 mcg 1 to 2 puffs b.i.d.  7. Ibuprofen  600 mg q. 6 hours p.r.n.  8. Revatio 20 mg p.o. t.i.d. at 10:00 a.m., 2:00 p.m., and 10:00 p.m.  9. Lasix 40 mg p.o. daily.  10.Ativan 1 mg 1/2 pill q.h.s. p.r.n.  11.Home O2 at 5-6 liters nasal cannula.   ALLERGIES:  DEMEROL, ULTRAM, DOXYCYCLINE, MORPHINE, HIGH DOSE STEROIDS.   SOCIAL HISTORY:  The patient lives with her daughter. Has a remote  history of tobacco use as well as alcohol abuse. The patient denies any  illegal drug use. The patient is not employed.   FAMILY HISTORY:  Mother with diabetes, hypertension, and CHF. Father  with lung cancer, deceased at 55. Siblings, nothing.   REVIEW OF SYSTEMS:  Review of systems is negative except as noted above  in HPI.   PHYSICAL EXAMINATION:  VITAL SIGNS:  Temperature 97.6, pulse 117,  respiratory rate 20, BP 132/84, sating greater than 95% on 6 liters face  mask.  GENERAL:  Alert and oriented x3 and moderate distress secondary to  increased respiratory effort.  HEENT: Normocephalic, atraumatic.  Extraocular movements intact.  NECK:  Supple.  Full range  of motion.  CARDIOVASCULAR:  Regular rate and rhythm.  LUNGS:  Positive for mild crackles in the base, positive for trace  expiratory wheezes at the apices.  ABDOMEN:  Soft, nontender, nondistended.  BACK:  Within normal limits.  EXTREMITIES:  2+ peripheral pulses with trace edema in distal lower  extremities.  NEUROLOGIC:  Grossly intact.   LABORATORY DATA:  CBC, white count of 11.1, hemoglobin 12.6, hematocrit  37.9, platelets of 218,000. CMP sodium 137, potassium 3.3, chloride 102,  CO2 25, BUN 11, creatinine 0.87, blood sugar 149, T bili 1.5, alkaline  phosphatase 55, AST 19, ALT 11. Cardiac enzymes point of care troponin  of 0.17, myoglobin of 55, CK-MB of less than 1.0. BNP 598. Troponin I of  0.04. Urinalysis, straw color, clear in appearance of 1.006, TSH 7.5,  glucose negative, bilirubin negative, ketone negative, blood negative,  protein negative, urobilinogen 0.2, nitrite negative, leukocyte  negative.   Chest X-Ray: Diffuse disease on chronic lung disease likely CHF with  small bilateral effusions.   ASSESSMENT/PLAN:  This is a 50 year old female with sarcoidosis here for  increased shortness of breath and chest pain.  1. Shortness of breath.  Possible etiologies for the patient's      shortness of breath includes CHF exacerbation versus pneumonia      versus PE versus worsening sarcoidosis. Initial chest x-ray is      indicative of possible CHF exacerbation given the presence of      bilateral effusions. The patient brain natriuretic peptide is at      the baseline. Also the patient initially had a elevated troponin      upon intake into the ED however, upon repeat troponin which was      subsequent 2  to 3 hours after the first troponin was drawn, the      patient had a troponin-I level of 0.04. The patient responded      extremely well during interview to the recent home O2 requirements      during the interview to 5 to 6 liters of O2, with SAO2 sating in      the  mid 90s. There was a slight leukocytosis on admission so blood      cultures have been drawn and we will hold on antibiotic treatment      pending a positive blood culture.  2. Chest pain.  This is a recurrent and chronic issue for the patient.      The patient had a repeat cath by cardiology  in May 2010 showing      normal coronary arteries. EKG in the ED showed nonspecific changes.      Again point of care troponin one was elevated, however, repeat      troponin was negative.  We will hold all nitrates secondary to the      patient being on Revatio for decreased blood pressure and we will      repeat cardiac enzymes in 8 hours.  3. Sinus tachycardia.  This is a chronic issue for the patient.  TSH      is normal and this has been an issue upon the majority of      admissions.  4. Sarcoidosis. The patient is at end-stage with pending follow-up as      an outpatient.  5. Schizophrenia.  We will continue her home dose of Risperdal.  6. Anxiety.  We will continue the patient's home dose of p.r.n.      Ativan.  7. Pulmonary hypertension.  Will continue the patient on the Revatio      for increased pulmonary artery pressures.  8. FENGI. Replete K p.r.n. as well as place the patient on low sodium      diet.  9. Prophylaxis.  The patient is on heparin.  10.Disposition:  We are pending clinical improvement of      symptomatology.      Shanda Howells, MD  Electronically Signed      Blane Ohara McDiarmid, M.D.  Electronically Signed    SN/MEDQ  D:  12/08/2008  T:  12/08/2008  Job:  [id number]

## 2010-08-21 NOTE — Discharge Summary (Signed)
NAME:  Rachel [name], RAUPP NO.:  [id number]   MEDICAL RECORD NO.:  [id number]          PATIENT TYPE:  INP   LOCATION:  5506                         FACILITY:  [facility]   PHYSICIAN:  Talbert Cage, M.D.DATE OF BIRTH:  1960/06/20   DATE OF ADMISSION:  06/27/2007  DATE OF DISCHARGE:  07/01/2007                               DISCHARGE SUMMARY   PRIMARY CARE PHYSICIAN:  Rolly Salter, M.D. at the [facility]  practice center.   DISCHARGE DIAGNOSES:  1. Community-acquired pneumonia.  2. Hypotension.  3. Hypertension.  4. Sarcoidosis.  5. Cardiomyopathy.  6. Psychosis.  7. Gastroesophageal reflux disease.  8. Normocytic anemia.   DISCHARGE MEDICATIONS:  1. Avelox 400 mg p.o. daily x1 day.  2. Enalapril 2.5 mg p.o. daily.  3. Furosemide 40 mg p.o. daily.  4. Flovent 110 mcg 2 puffs inhaled b.i.d.  5. Potassium chloride 20 mEq p.o. daily.  6. Esomeprazole 40 mg p.o. daily.  7. Nasonex 50 mcg one spray each naris b.i.d.  8. Risperidone 1 mg p.o. daily.  9. Carvedilol 12.5 mg p.o. b.i.d., note this is an increased dose.  10.Albuterol as needed.   PROCEDURES:  1. Portable chest x-ray on June 27, 2007, showed a new opacity in the      right upper lobe suspicious for pneumonia.  Somewhat prominent      right paratracheal soft tissue as well, adenopathy cannot be      excluded and followup chest x-ray or CT of the chest is      recommended.  Chronic fibrotic change noted bilaterally as well.  2. CT angiogram of the chest on June 29, 2007.  Scarring changes most      consistent with sarcoidosis involving predominantly upper lobes      with fibrotic changes.  Cylindrical traction bronchiectatic changes      seen within upper lobes.  Mild superimposed infiltrate within the      right upper lobe.  Mildly enlarged subcarinal mediastinal lymph      nodes, mildly enlarged right paratracheal lymph nodes.   LABS:  Blood culture x2, no growth.  Cardiac enzymes were  negative x2.  TSH of 0.466.  On admission on June 27, 2007, CMET is significant for a  glucose of 155 and an albumin of 2.7 with a creatinine of 0.99.  White  blood count is 17.4, hemoglobin 11.6, hematocrit 34.7, and platelets  216.  BNP 1374.  On discharge on July 01, 2007, BMET is significant for  a glucose of 126 and a calcium of 8.3.  Creatinine is 0.83.  CBC:  White  blood count 10.2, hemoglobin 10.0, hematocrit 29.5, and platelets 258.  MCV 88.9.  RDW 13.4.   BRIEF HOSPITAL COURSE:  This is a 50 year old female admitted with  community-acquired pneumonia.  1. Community-acquired pneumonia.  The patient was treated with Avelox      for 7 days.  Oxygen requirement in the hospital, but the patient      also has a 3 L of oxygen home requirement due to sarcoidosis.  The  patient was afebrile and with a white blood count of 10.2 on      discharge, white blood count was 17.4 on admission.  2. Hypotension.  The patient was admitted with a blood pressure in the      80s/50s, this resolved with rehydration.  Her carvedilol and      enalapril were initially held.  3. Hypertension.  We restarted the patient's hypertension medications      after resolution of her hypotension.  Note that the carvedilol dose      was increased from 6.25 mg to 12.5 mg p.o. b.i.d.  Blood pressure      on discharge is 133/96.  4. Sarcoidosis.  Stable during admission.  Treated pulmonary symptoms      with Flovent and as needed levalbuterol, and supplemental oxygen.      Paratracheal lymphadenopathy noted on chest CT, follow as      outpatient.  5. Cardiomyopathy.  Stable during admission.  Continued furosemide      with potassium supplementation.  6. Psychosis.  Stable during admission.  Continue treatment with      risperidone.  7. GERD.  Stable during admission.  Treated with pantoprazole 40 mg      p.o. daily during admission, discharged on home esomeprazole.  8. Normocytic anemia.  The patient was  noted to have a hemoglobin of      10.0 during this admission, with an MCV of 88.9 and an RDW of 13.4.      This should be followed up as an outpatient.   FOLLOW-UP ISSUES:  1. Pneumonia resolution.  2. Paratracheal lymphadenopathy noted on chest CT, the patient has      sarcoidosis.  3. Normocytic anemia.  4. Blood pressure.   DISCHARGE INSTRUCTIONS:  Increase activity slowly.   DIET:  Follow low sodium heart healthy diet.   DISCHARGE CONDITION:  The patient was discharged home in stable medical  condition.   FOLLOW-UP APPOINTMENTS:  The patient is to follow-up appointment at the  [facility] on July 09, 2007, at  10:30 a.m.      Graciella Belton, MD  Electronically Signed      Talbert Cage, M.D.  Electronically Signed    MO/MEDQ  D:  07/08/2007  T:  07/09/2007  Job:  [id number]   cc:   Rolly Salter, M.D.

## 2010-08-21 NOTE — Discharge Summary (Signed)
Rachel [name], GILLENTINE NO.:  [id number]   MEDICAL RECORD NO.:  [id number]          PATIENT TYPE:  INP   LOCATION:  2992                         FACILITY:  [facility]   PHYSICIAN:  Dickie La, MD        DATE OF BIRTH:  June 18, 1960   DATE OF ADMISSION:  04/11/2008  DATE OF DISCHARGE:  04/26/2008                               DISCHARGE SUMMARY   PRIMARY CARE [name]:  Rolly Salter, MD, also known as [name].   DISCHARGE DIAGNOSES:  1. 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  2. Cor pulmonale/congestive heart failure.  3. Nausea.  4. Schizophrenia.  5. Hypertension.   DISCHARGE MEDICATIONS:  1. Albuterol 2.5 mg 2 puffs q.4-6 h. for shortness of breath.  2. Calcium carbonate 500 mg p.o. daily.  3. Coreg 6.25 mg p.o. b.i.d.  4. The patient was instructed to hold her Lasix 40 mg p.o. b.i.d.      until speaking with [name] or [name].  5. Nasonex 50 mcg 2 sprays in each nostril daily as needed.  6. Zofran 4 mg p.o. t.i.d. with meals if this is a new medication for      this patient.  7. Nexium 40 mg p.o. daily.  8. Risperdal 0.5 mg p.o. daily  9. Benicar 5 mg p.o. daily.  10.Fluticasone 2 puffs of the 110 mcg twice daily for 1 week.  11.Mucinex 1200 mg p.o. daily.  12.Ibuprofen 600 mg p.o. q.6 h. for pain.   The patient is instructed to discuss the fluticasone dosing with Dr.  Jerline Pain at her next appointment.   CONSULTS:  The patient 2 consults during this hospitalization Dr.  Anastasio Champion did an evaluation on this patient on April 22, 2008.  The  patient was also seen by her own pulmonologist, [name] on  April 14, 2008.   PROCEDURES:  The patient had an initial chest x-ray on April 11, 2008,  that showed diffuse intraparenchymal disease with progression.  In the  upper lobes, the patient has nodular density in the left upper lobe on  today's study  Followup imaging is recommended to ensure that this did  not progress.  She had a CT angio on April 11, 2008, as well.  It  showed no pulmonary embolism but end-stage sarcoidosis and the lung  scarring and bronchiectasis and fibrotic changes, ground-glass opacity  throughout the more normal-appearing portions of the lung consistent  with acute alveolitis, possibly an acute exacerbation of the chronic  sarcoidosis.  It showed slight interval increase in the right hilar  lymph nodes, and it also showed that she had right atrial enlargement in  particular with an AICD pacemaker lead tip in the right ventral apex.  On April 13, 2008, she had a chest x-ray that showed persistent  bilateral interstitial and airspace opacities, most of it seen that was  chronic and related to her sarcoidosis.  She had a repeat chest x-ray on  April 14, 2008, which showed stable cardiomegaly and changes of end-  stage sarcoidosis,  no acute or new pulmonary parenchymal abnormalities,  and a final study done on April 16, 2008, was an abdominal ultrasound,  which showed no evidence of acute abnormality of the abdomen.  The  patient had many labs while here in the hospital.  Her initial CBC on  April 11, 2008, showed a white blood cell count of 9.8, hemoglobin of  11.7, and platelets of 265.  She did have cardiac enzymes that were  found to be completely normal.  One point-of-care and 4 cardiac enzyme  panels that were negative.  She did have also beta-natriuretic peptide  that was 222, which is mildly elevated.  She had a urinalysis that  showed that the patient was mildly dehydrated, had small bilirubin, 15  ketones, and 100 protein but was negative nitrite and negative  leukocyte.  She had a hepatic function panel that was completely normal  except for an albumin that was mildly low at 3.2.  She had a blood  lipase level on April 12, 2008, that was normal.  During the  hospitalization, she had a urine culture that was final on April 14, 2008, that showed no  growth.  The patient also had an ACE level that was  drawn and that was within the normal range at 34, however, believe her  baseline it was 10, so this is elevated for her.  She had an arterial  blood gas drawn on April 18, 2008, that showed a pH of 7.34, pCO2 of  67.4, O2 of 48.9, bicarb of 35.4, and oxygen saturation of 78.7.  The  patient had 2 sets of blood cultures that became final, that showed no  growth in 5 days, and on the day of discharge, the patient had a CBC  with a white blood cell count of 6.5, hemoglobin of 9.9, and platelets  of 230 with a final BNP that was completely normal.  Her electrolytes  and renal function were normal.   BRIEF HOSPITAL COURSE:  1. This is a 50 year old female with end-stage, stage IV sarcoidosis      who presented to the hospital with either a sarcoidosis flare      versus respiratory illness.  The patient was initially very short      of breath and was so dyspneic that she was unable to complete her      ADLs, so the patient was admitted and initially started on      doxycycline.  She did complete a 10-day course of doxycycline.  The      patient was also started on some low-dose steroids.  She has a      history of steroids inducing a psychosis in the past, but it was      felt that the benefits outweighed the risks since she was already      been treated with Risperdal.  The patient was ruled out for acute      coronary syndrome with serial cardiac markers that came back      negative, and she had EKG and that was normal to for her.  A CT      angiography showed that the patient did not have a pulmonary      embolism, but the patient did show a lung nodule on exam that will      need to have followup as outpatient at a later time.  The patient      was continued on albuterol and Atrovent nebs during the initial  part of the hospitalization.  On April 13, 2008, the patient had      desated down into the 70s and the patient required  increasing      oxygen.  She had to go up on her oxygen to 6 L and eventually with      continue desaturation required a Venturi mask at 50%.  The patient      was continued on the Venturi mask for several days.  She was      switched over to vanc and Zosyn for a few days, but then after her      pulmonologist, [name] came and gave his recommendations.  He      recommended switching back to doxycycline that he felt that that      would be the best treatment for her.  He also recommended switching      to nebulized Pulmicort as well as weaning her off Solu-Medrol.  The      patient had a prolonged course during her hospital stay as her      lungs are compromising, it took her quite a bit of time to recover.      She does have a home oxygen requirement of 3-4 L, but during this      hospitalization continued to require up to the 50% on Venturi mask.      At the time of discharge, the patient was able to get down to 5 L      of oxygen nasal cannula and was sent home with additional oxygen      for this.  The patient does have home health that provides her      oxygen and had dropped off the oxygen tank here in the hospital      before discharge.  The patient's ACE level did appear to be      elevated at 34.  Her baseline is around 10.  This could signify      that this was a sarcoidosis flare, although this is not out of the      normal range for a ACE level.  The patient had gained a quite a bit      of strength by the time she left the hospital and was recovering      well.  2. Congestive heart failure.  This patient has right-sided heart      failure, so cor pulmonale due to her lung disease.  The patient did      have an echo that showed that her ejection fraction was 40-45% with      severe tricuspid regurg.  She had a right atrium that was      moderately dilated and a right ventricular systolic pressure that      was 50-55 mmHg.  This is really unchanged from her previous echo.       The results were discussed with the patient, and the patient was      made aware that if she were to improve from this course of disease      then she would require a heart and lung transplant something she      plans to discuss with her pulmonologist, [name].  The      patient was seen by Palliative Care on April 22, 2008, Dr.      Anastasio Champion.  He discussed with the patient her goals of care if she      were to get  her transplant and if she did not get her transplant.      It is important at this time for the patient to start planning and      I had been thinking about what her goals of care are at this time.  3. The patient had nausea and abdominal pain during the initial part      of the hospitalization.  The patient did have an ultrasound on      April 16, 2008, that was normal.  The patient was continued on      Nexium and Zofran during the hospitalization, which did seem to      relieve her symptoms.  4. Schizophrenia.  The patient has a history of schizophrenia induced      by high-dose steroids.  The patient has a prior behavioral medicine      stay where she was started on Risperdal.  She is continued on      Risperdal 0.5 mg p.o. daily for her history of schizophrenia and      seems to be tolerating the medication well.  She did not have any      psychological breaks during this hospitalization.  If she is to be      followed up after this hospitalization, the patient may want to      have follow up for the lung nodule that was appeared on CT angio at      some later date.  The patient has followup appointments with Dr.      Jerline Pain, who is her PCP on April 28, 2008, at 11:15, the number is      [phone].  The patient also has an appointment with her      pulmonologist, [name], phone number is [phone] on      May 03, 2008, at 9:45 a.m. to discuss her medications and her      options for transplant.  The patient was sent home with [facility] as well as home health to follow up on her oxygen.      She was discharged to home in stable medical condition.      Esmeralda Arthur, MD  Electronically Signed      Dickie La, MD  Electronically Signed    AS/MEDQ  D:  04/26/2008  T:  04/27/2008  Job:  [id number]

## 2010-08-21 NOTE — H&P (Signed)
NAMEALONNA, BARTLING NO.:  [id number]   MEDICAL RECORD NO.:  [id number]          PATIENT TYPE:  INP   LOCATION:  4705                         FACILITY:  [facility]   PHYSICIAN:  Jamal Collin. Hensel, M.D.DATE OF BIRTH:  1960-04-15   DATE OF ADMISSION:  08/22/2008  DATE OF DISCHARGE:                              HISTORY & PHYSICAL   PRIMARY CARE PHYSICIAN:  [name] of [facility].   CHIEF COMPLAINT:  Shortness of breath and chest pain.   HISTORY OF PRESENT ILLNESS:  The patient is a 50 year old female with  severe sarcoidosis who recently was hospitalized for worsening shortness  of breath and discharged on May 12, presents with worsening shortness of  breath after her home health nurse found her pulse oximetry to be in the  80s on home regimen of 6 liters of oxygen.  The patient states that it  was actually at 36.  Since going home on the 12th, the patient reports  feeling weak, lightheaded and having bilateral leg pain.  The patient  also reports some nonspecific chest pain which was present prior to  discharge, and she ruled out for acute coronary syndrome twice.  The  patient was seen by Dr. Tyler Aas her last hospitalization who did not  believe worsening shortness of breath was secondary to sarcoidosis but  recommended outpatient PFTs.  The patient's cardiologist, Dr. Radford Pax,  recommended outpatient followup, and the patient's AICD was interrogated  and found to be functioning properly.   PAST MEDICAL HISTORY:  1. Pulmonary sarcoidosis with home oxygen requirement of 6 liters.  2. Schizophrenia.  3. Sciatica.  4. Fatigue.  5. Anemia.  6. Somatization disorder.  7. Allergic rhinitis.  8. Panic attacks.  9. Hypertension.  10.Congestive heart failure with EF of 40-45%.  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  12.Anxiety.   MEDICATIONS:  1. Albuterol 2 puffs q.4-6 h. p.r.n. shortness of breath.  2. Coreg 12.5 mg p.o. b.i.d.  3. Lasix 40  mg p.o. p.r.n. leg swelling.  4. Nasonex 2 sprays in each nostril daily p.r.n.  5. Nexium 40 mg p.o. daily.  6. Risperdal 0.5 mg p.o. daily.  7. Flovent 110 mcg 2 puffs b.i.d.  8. Ibuprofen 600 mg p.o. q.6 h. p.r.n. pain.  9. Albuterol nebulizers q.4 h. p.r.n. shortness of breath.  10.Meloxicam 7.5 mg p.o. daily p.r.n.  11.Benicar 20 mg p.o. daily.  12.Astepro 1-2 sprays in each nostril b.i.d. p.r.n.   ALLERGIES:  DEMEROL, ULTRAM, and DOXYCYCLINE.   FAMILY HISTORY:  Father died in 5s of lung cancer. Mother borderline  diabetes, hypertension and congestive heart failure.   SOCIAL HISTORY:  The patient has remote history of alcohol abuse, quit  in 1994.  Also has a history of remote tobacco abuse, quit in 1994 also.  The patient lives with a daughter.  She is currently not working.   REVIEW OF SYSTEMS:  Positive for fatigue, shortness of breath with lying  down, shortness of breath with exertion, peripheral edema, chest pain,  shortness of breath at rest.  Otherwise, complete review of systems  negative.   PHYSICAL EXAMINATION:  Oxygen saturation 88% on 6 liters.  Temperature  97.6, heart rate 110, respiratory rate 35, blood pressure 121/74.  GENERAL:  The patient is alert and conversant on oxygen.  Does not  appear in respiratory distress but is using accessory muscles and has  supraclavicular retractions.  HEENT:  Extraocular movements intact.  Pupils are equal, round, and  reactive to light and accommodation.  Hearing grossly normal.  No  rhinorrhea.  Moist mucous membranes.  Positive for thrush.  No  appreciable JVD.  RESPIRATORY:  No tenderness of palpation of chest wall.  Occasional  crackles.  Supraclavicular retractions.  No wheezing.  Does not appear  in distress but is tachypneic in the 30s and 40s speaking in full  sentences.  CARDIOVASCULAR:  Tachycardic with regular rhythm.  ABDOMEN:  Soft, nontender, nondistended, positive bowel sounds.  EXTREMITIES:  2+  peripheral pulses, 1 to 2+ peripheral edema in the mid  calf.  NEUROLOGICAL:  Cranial nerves II-XII grossly intact, nonfocal.  SKIN:  No rashes.  PSYCH:  The patient is anxious and frequently glances at telemetry  monitoring, normally interactive, no overt signs of psychosis.   LABORATORY DATA:  Sodium 142, potassium 3.8, chloride 105, bicarb 32,  glucose 135, BUN 15, creatinine 1.3, calcium 9.3.  White blood cell  count 7.8, hemoglobin 11.4, hematocrit 34.6, platelets 273.  Point of  care cardiac markers negative x1.  BNP 914.  Chest x-ray:  Probable  edema superimposed on chronic lung disease.   ASSESSMENT AND PLAN:  1. Dyspnea.  Ongoing dyspnea, ongoing problem per patient.  BNP      elevated but was 1179 on May 7.  Chest x-ray with concern for      edema.  Believe hypoxia/increased shortness of breath secondary to      congestive heart failure exacerbation.  Will treat with IV Lasix.      Monitor input and output.  Last echocardiogram in January 2010 with      EF 40-45%.  Will aim for O2 saturations between 86 and 90% which      the patient's baseline.  Observe on telemetry.  Continue home      inhalers, albuterol and Flovent.  2. Sarcoidosis.  Believe this is stable.  will observe.  Do not think      we need to reconsult [name] at this time.  3. Schizophrenia.  Continue Risperdal.  4. Hypertension.  Continue home medications. monitor for hypotension      given addition of Lasix.  5. Congestive heart failure with acute exacerbation.  The patient with      systolic dysfunction with EF of 40-45% as well as right heart      failure.  Likely etiology of dyspnea given chest x-ray findings.      Will diurese.  Repeat a chest x-ray in the morning.  Cycle cardiac      markers in the event that acute coronary event caused      decompensation.  The patient reports eating many frozen dinners      which are high in sodium.  Will place on low-salt diet.  Daily      weights and strict  input and output.  6. Anxiety.  Likely a contributor for dyspnea.  Will defer to PCP.      However, likely patient could benefit from pharmacologic agent for      anxiety.  7. Chest pain.  Ongoing issue.  Had rule out  for acute coronary      syndrome multiple times.  Pain is reproducible.  Likely chest wall      pain.  Will cycle cardiac markers as scheduled.  Repeat EKG in the      morning.  8. Sinus tachycardia.  Likely secondary to anxiety versus mild      respiratory distress.  The patient does not appear hypovolemic.      Continue Coreg.  Check a TSH.  9. Fluids, electrolytes, and nutrition/gastrointestinal.  Low-sodium      diet.  Saline lock IV.  10.Prophylaxis with Coumadin and heparin.   DISPOSITION:  Pending clinical improvement.      Sherrell Puller, MD  Electronically Signed      Jamal Collin. Andria Frames, M.D.  Electronically Signed    TCB/MEDQ  D:  08/25/2008  T:  08/25/2008  Job:  [id number]

## 2010-08-21 NOTE — Discharge Summary (Signed)
[name], [name] NO.:  [id number]   MEDICAL RECORD NO.:  [id number]          PATIENT TYPE:  INP   LOCATION:  6525                         FACILITY:  [facility]   PHYSICIAN:  Jamal Collin. Hensel, M.D.DATE OF BIRTH:  03-May-1960   DATE OF ADMISSION:  09/10/2008  DATE OF DISCHARGE:  09/14/2008                               DISCHARGE SUMMARY   PRIMARY CARE PHYSICIAN:  Lyna Poser, MD, [facility]   DISCHARGE DIAGNOSES:  1. Respiratory distress.  2. Pulmonary sarcoidosis.  3. Schizophrenia.  4. Anxiety.  5. Back pain.  6. Acute renal insufficiency, resolved.   DISCHARGE MEDICATIONS:  1. Albuterol 2 puffs q.4-6 h. as needed for breathing.  2. Coreg 3.125 mg by mouth daily.  This medication was held.  3. Nasonex 50 mcg 2 sprays daily each nostril.  4. Nexium 40 mg by mouth daily.  5. Risperdal 0.25 mg by mouth at bedtime.  6. Flovent 110 mcg 1-2 puffs twice daily.  7. Ibuprofen 600 mg every 6 hours as needed for pain.  8. Revatio 20 mg by mouth 3 times daily at 10:00 a.m., 2:00 p.m., and      10:00 p.m.  9. Lasix 20 mg by mouth every other day.  10.Ativan 1 mg one-half pill at night as needed for anxiety.  11.Oxygen 5-6 L by nasal cannula at all times.   IMAGING:  1. CT angio on September 10, 2008.  Impression:  No CT evidence for acute      pulmonary embolus.  Marked bilateral pleural parenchymal scarring      in the lungs consistent with the patient's reported history of end-      stage sarcoidosis.  Scarring/fibrosis in the right central upper      lobe/superior hilar region to see if region generates substantial      mass effect, pulmonary arteries to the right upper lobe.  Stable      appearance with extensive ground-glass attenuation drop of lungs.  2. Chest x-ray on September 11, 2008.  Impression:  Decrease in edema.   LABORATORIES UPON DISCHARGE:  Sodium 145, potassium 3.9, chloride 104,  CO2 of 33, glucose 106, BUN 7, and creatinine  0.8.  White blood cell  count 7.3, hemoglobin 12.2, hematocrit 37.5, and platelets 269.   ASSESSMENT AND PLAN:  Rachel [name] is a 50 year old female with stage IV  pulmonary sarcoidosis that presented with respiratory distress.  Please  see H and P for further details.  1. Respiratory distress.  The patient was noted to be recently      discharged from [facility] on Aug 30, 2008, after CHF exacerbation.      During the previous stay, she underwent a cardiac catheterization      that showed an ejection fraction of 55% with normal wall motion and      normal coronary arteries.  She was diuresed during her stay, but      was discharged without diuretics.  At this admission, she presented      with respiratory distress consistent with CHF  exacerbation.  She      was admitted to telemetry bed and continued on her BiPAP, which had      been started in the emergency department, per RT with instructions      to wean her (with parameters to keep her oxygen between 86-90%).      She was able to be weaned fairly easily.  She was diuresed with      Lasix 80 IV b.i.d. with good urine output; however, she did develop      some weakness and hypotension with this diuresis as well as an      increase in her creatinine.  The Lasix was held for 1 day and her      blood pressure and creatinine improved markedly.  She was      discharged home with Lasix 20 mg to take every other day for edema.      She was discharged with home health to check her blood pressure for      one week until she follows up with her primary care physician.  The      patient was also given Solu-Medrol to add to her regimen to treat      her sarcoidosis/acute inflammatory component contributing to her      respiratory distress.  The patient did endorse fevers and chills      with her cough that she was covered for pneumonia with azithromycin      and Rocephin.  Because, she also endorses some mild chest pain, her      cardiac enzymes  were cycled, which were all negative and a CT      angiogram was obtained to evaluate for PE, which was also negative.   1. Pulmonary sarcoidosis.  This is noted to be end-stage.  The patient      does see a pulmonologist, Dr. Krista Blue, and was encouraged to make a      follow up appointment to see him after her hospital discharge.   1. Schizophrenia.  The patient's home medications were continued      including her Risperdal.   1. Anxiety.  The patient did have significant anxiety regarding her      breathing.  She did state that she cannot tolerate many medications      and did not want anything to make her loopy.  She was started on      small dose of Ativan at bedtime on the hospital and did well on      this medication.  She was discharged with prescription for this      medication and encouraged to continue.   1. Hypertension.  The patient did experience some hypotension after      diuresis with high dose Lasix.  Her home medication of Coreg was      held until she is evaluated by her PCP.  Her home health RN will be      following her for the next week taking daily blood pressures.   DISPOSITION:  The patient was evaluated by physical therapy who  recommended home health PT, which will be provided to the patient.   FOLLOWUP:  Follow up with [name] or Dr. Buelah Manis in the [facility].   FOLLOWUP ISSUES:  1. Monitoring for continued resolution of respiratory distress.  2. Follow up blood pressure.  3. Follow up anxiety.      Briscoe Deutscher, MD  Electronically Signed  William A. Andria Frames, M.D.  Electronically Signed   EW/MEDQ  D:  11/02/2008  T:  11/03/2008  Job:  [id number]

## 2010-08-21 NOTE — Assessment & Plan Note (Signed)
[facility]                         ELECTROPHYSIOLOGY OFFICE NOTE   NOTNAMED, CROUCHER                           MRN:          [id number]  DATE:03/22/2008                            DOB:          [birth year]    Ms. Erker returns today for followup.  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  She  is followed by [name] for this.  She returns today for followup.  In  the interim, she has been stable.  She has some good days and bad days,  but mostly heart failure has been well controlled.  She denies chest  pain.  She still has some residual tenderness at her ICD insertion site,  now 5 years out from her implant.   Her medicines today include:  1. Benicar 5 a day.  2. Flovent inhaler.  3. Risperdal 2 a day.  4. Nexium 40 a day.  5. Coreg 6.25 twice daily.  6. Potassium 20 twice daily.   On exam, she is a pleasant middle-aged woman in no distress.  Blood  pressure was 115/82, the pulse 88 and regular, the respirations were 18.  Weight was 180 pounds.  Neck revealed no jugular venous distention.  Lungs clear bilaterally to auscultation.  No wheezes, rales, or rhonchi  are present.  No increased work of breathing.  Cardiac exam regular rate  and rhythm.  Normal S1 and S2.  Abdominal exam was soft and nontender.  Extremities demonstrated no edema.   Interrogation of her defibrillator demonstrates a [employer]. [employer] V-196, the R-  waves were 8, the impedance 360, the threshold 1.25 at 0.5, the battery  voltage was 2.55 V.  Underlying rhythm was sinus at 94 beats per minute.  She did have 1 episode of sinus tachycardia that made it into the VT  zone and so we have reprogrammed her device today to make her VT  detection at 185 instead of 177.   IMPRESSION:  1. Nonischemic cardiomyopathy.  2. 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  3. Home oxygen secondary to sarcoidosis with sarcoid lung and cardiac      involvement.   DISCUSSION:  Overall, Ms. Farone is stable, and her defibrillator is  working normally.  Her battery voltage has not changed in the last year.  We will plan to see the patient back in the office for followup in 1  year.     Champ Mungo. Lovena Le, MD  Electronically Signed   GWT/MedQ  DD: 03/22/2008  DT: 03/22/2008  Job #: [id number]

## 2010-08-21 NOTE — Assessment & Plan Note (Signed)
[facility] OFFICE NOTE   Rachel, [name]                           MRN:          [id number]  DATE:03/10/2007                            DOB:          01-20-1961    SUBJECTIVE:  Rachel [name] returns today for followup.  She is a very  pleasant middle-aged woman with a history of non-ischemic cardiomyopathy  and a history of sarcoid lung and heart disease, who returns today for  followup.  She has class 2 heart failure.  She previously had severe LV  dysfunction with an ejection fraction of 20%.  Most recently her  ejection fraction was improved up to 50%.  She denies chest pain.  She  denies shortness of breath.  She still notes some funny sensation at her  implantable cardioverter defibrillator insertion site.  She has never  had inter-current implantable cardioverter therapy.   CURRENT MEDICATIONS:  1. Include Enalapril 2.5 mg daily.  2. Potassium 20 mEq daily.  3. Coreg 6.25 mg twice daily.  4. Nexium 40 mg daily.  5. Flovent.  6. Risperdal.   PHYSICAL EXAMINATION:  GENERAL:  She is a pleasant, well-appearing,  middle-aged woman, in no acute distress.  VITAL SIGNS:  Blood pressure 131/88, pulse 70 and regular, respirations  18, weight 183 pounds.  NECK:  No jugular venous distention.  LUNGS:  Clear bilaterally to auscultation.  No wheezes, rales or  rhonchi.  CARDIOVASCULAR:  A regular rate and rhythm.  Normal S1 and S2.  EXTREMITIES:  Demonstrate no edema.   Interrogation of her defibrillator demonstrates a [employer]ian V-196.  The R-waves are 9.  The impedance is 385, threshold 1.25 volts at 0.5  msec.  The battery voltage was 2.55 volts.   IMPRESSION:  1. Non-ischemic cardiomyopathy.  2. Sarcoid lung disease.  3. Congestive heart failure with recent interim improvement in her      left ventricular dysfunction and heart failure symptoms.   DISCUSSION:  Overall, Rachel [name] is stable.  Her  defibrillator is working  normally.  Her battery voltage is 2.55, which is approaching ERI, but  she is not there yet.  We will plan to see her back in the office in six  months for followup and in one year with me.     Champ Mungo. Lovena Le, MD  Electronically Signed    GWT/MedQ  DD: 03/10/2007  DT: 03/10/2007  Job #: [id number]

## 2010-08-21 NOTE — Cardiovascular Report (Signed)
NAME:  Rachel [name], Rachel [name] NO.:  [id number]   MEDICAL RECORD NO.:  [id number]           PATIENT TYPE:   LOCATION:                                 FACILITY:   PHYSICIAN:  Minus Breeding, MD, FACCDATE OF BIRTH:  06-Jul-1960   DATE OF PROCEDURE:  08/26/2008  DATE OF DISCHARGE:                            CARDIAC CATHETERIZATION   PRIMARY CARE PHYSICIAN:  Dr. Consuello [facility].   CARDIOLOGIST:  Champ Mungo. Lovena Le, MD   PROCEDURE:  Left and right heart catheterization/coronary arteriography.   INDICATIONS:  Evaluate the patient with pulmonary hypertension,  sarcoidosis, dyspnea, heart failure, and an echo suggesting an ejection  fraction of 35%.   PROCEDURE NOTE:  Left and right heart catheterization performed via the  right femoral artery and vein respectively.  Both vessels were  cannulated using the anterior wall puncture.  A #5-French arterial  sheath and a #7-French venous sheath were inserted via the modified  Seldinger technique.  Preformed Judkins and a pigtail catheter were  utilized.  The patient tolerated the procedure well and left the lab in  stable condition.   RESULTS:  1. Hemodynamics:  RA mean 5, RV 67/1, PA 67/30 with a mean of 46,      pulmonary artery capillary wedge pressure mean 6, LV 83/4, AO      82/57, cardiac output/cardiac index 6.35/3.59.  2. Coronaries:  Left main was normal.  The LAD was normal.  First      diagonal was large and normal.  The circumflex in the AV groove was      normal.  Obtuse marginal 1 was tiny and normal.  Obtuse marginal 2      was small and normal.  Posterolateral was large and normal.  The      right coronary artery was a dominant vessel and large.  The PDA was      large and normal.  3. Left ventriculogram.  The left ventriculogram was obtained in the      RAO projection.  The EF was 55% with normal wall motion.   CONCLUSION:  Normal coronary arteries.  Preserved ejection fraction (the  ejection  fraction by this study was better than suggested by the echo).  There is moderately severe to severe pulmonary hypertension.   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  Her end-diastolic left ventricular pressure and wedge pressure are  normal.  She will continue with primary management of this problem.      Minus Breeding, MD, [facility]  Electronically Signed     JH/MEDQ  D:  08/26/2008  T:  08/27/2008  Job:  [id number]   cc:   [name].  Champ Mungo. Lovena Le, MD

## 2010-08-21 NOTE — H&P (Signed)
Rachel [name], Rachel [name] NO.:  [id number]   MEDICAL RECORD NO.:  [id number]          PATIENT TYPE:  INP   LOCATION:  5009                         FACILITY:  [facility]   PHYSICIAN:  [place] A. Walker Kehr, M.D.    DATE OF BIRTH:  18-Nov-1960   DATE OF ADMISSION:  08/11/2008  DATE OF DISCHARGE:                              HISTORY & PHYSICAL   PRIMARY CARE [place]:  Rolly Salter, MD, [place].   PRIMARY PULMONOLOGIST:  Kasandra Knudsen. Annamaria Boots, MD, FCCP, FACP   PRIMARY CARDIOLOGIST:  Champ Mungo. Lovena Le, MD   CHIEF COMPLAINT:  Difficulty breathing.   HISTORY OF PRESENT ILLNESS:  A 50 year old female with history of end-  stage sarcoidosis, awaiting transplant presents with progressive  shortness of breath, chest pain, and leg swelling.  Possibly 30-month ago, the patient began having difficulty breathing, was started on  prednisone taper via pulmonologist and currently on 5 mg p.o. daily.  Over the past few days, the patient's shortness of breath worsened with  associated sharp pleuritic chest pain.  The pain is located in the  center of chest, worse with deep inspiration, improved with shallow  breathing and rest.  Chest pain is associated with nausea, but no  emesis, positive diaphoretic sensation in palms.  The patient also  admits to worsening edema of the lower extremities and which she feels  comes when she has worsening shortness of breath.   REVIEW OF SYSTEMS:  Negative for fever.  Negative for diarrhea.  No URI  symptoms.  No recent antibiotics.  Positive swelling in feet.  Positive  tight feeling in arms occasionally.  Positive fatigue.  Positive white  film on tongue.   PAST MEDICAL HISTORY:  1. Sarcoidosis, stage IV, 6 liters O2 baseline.  2. Schizophrenia.  3. Hypertension.  4. Panic attacks.  5. Generalized anxiety disorder.  6. Sarcoid related cardiomyopathy, status post ICD.  7. GERD.  8. DAlfonse Rastenosynovitis.  9. Anemia.  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  11.Allergic rhinitis.  12.CHF.  EF of 40%.  13.Sciatica.  14.Back pain.  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   PAST SURGICAL HISTORY:  AICD placement.   MEDICATIONS:  1. Albuterol 90 mcg inhaler 2 puffs q. 4-6 h. p.r.n. shortness of      breath.  2. Coreg 12.5 mg p.o. b.i.d.  3. Lasix 40 mg p.r.n. leg swelling.  4. Nasonex 50 mcg 2 sprays each nostril daily.  5. Nexium 40 mg p.o. daily.  6. Risperdal 0.5 mg p.o. daily.  7. Flovent 110 two puffs t.i.d.  8. Motrin 800 mg p.o. q.6 h. p.r.n. pain.  9. Benicar 20 mg p.o. daily.  10.Prednisone 5 mg p.o. daily.   ALLERGIES:  DEMEROL with DOXYCYCLINE.   FAMILY HISTORY:  Father deceased at 634secondary to lung cancer.  Mother  diabetes, hypertension, and CHF.   SOCIAL HISTORY:  History of tobacco, quit 1994, prior 20-pack-year.  EtOH, quit 1994.  Lives with daughter.   PHYSICAL EXAMINATION:  VITAL SIGNS:  Temperature 98.9, heart rate 96,  respiratory rate 18, blood pressure 129/94,  and 94% on 6 liters.  GENERAL:  No acute distress, alert and oriented x3, pleasant.  HEENT:  PERRL, nonicteric.  EOMI.  Oral thrush.  Moist mucous membranes.  Oropharynx clear.  CVS:  Irregular rhythm.  Diastolic murmur, left sternal border.  Positive hepatojugular reflex.  Positive chest wall tenderness.  RESPIRATORY:  CTAB.  No wheeze.  No retractions.  Good air movement.  ABDOMEN:  Positive bowel sounds, soft.  Tender to palpation on right  upper quadrant.  No masses, nondistended.  No hepatosplenomegaly.  EXTREMITIES:  1+ pitting edema, pretibial pulses 2+.  Moving all four  extremities.  NEUROLOGIC:  No focal deficits.   LABS:  BNP 780.  Point of care negative x1.  I-STAT; sodium 141,  potassium 3.7, BUN 11, creatinine 1.1, glucose 106.  CBC; white count  10.4, hemoglobin 11.9, hematocrit 35.3, platelets 223, 85% neutrophils,  ANC of 8.8.   EKG per ED; nonspecific T-wave changes, sinus tachycardia.  Chest x-ray;   no definite acute disease, extensive fibrosis compatible with known end-  stage sarcoidosis, stable cardiomegaly.  Echocardiogram January 2010, EF  40-45%, increased right ventricular pressure, severe tricuspid  regurgitation.  PFTs September 2009, FEV1 54% of predicted, diffusion  capacity 27% of predicted.   ASSESSMENT AND PLAN:  A 50 year old female with end-stage sarcoidosis  admitted with shortness of breath, chest pain.  1. Shortness of breath.  The patient with end-stage disease on      transplant list, concern for congestive heart failure secondary      sarcoidosis with elevated BNP and lower extremity edema, right      heart failure signs.  We will gently diuresis with Lasix 40 mg IV      x1, continue O2 as the patient has baseline, 6 liters oxygen      requirement.  We will keep saturations in the upper 80s to low 90s.      Obtain CT angiogram, shortness of breath, not improved, Wells score      currently 0, also consult with the patient's primary pulmonologist      in the a.m.  2. Chest pain, atypical as substernal likely pleuritic chest pain.      However with cardiac history of hypertension, we will cycle cardiac      enzymes and repeat EKG in a.m.  3. Congestive heart failure, likely compensated heart failure,      ejection fraction 40-45% in January.  No repeat echocardiogram at      this time, diuresis per above.  Continue beta-blocker with      hepatojugular reflex.  We will obtain proper CMET, trend BNP.      [facility] of the patient decompensate.  4. Sarcoidosis.  We discontinued prednisone as the patient only have 1-      day left, consult pulmonary in the a.m., continue nebulizer      treatments.  5. Gastroesophageal reflux disease.  We will give Protonix as hospital      formulary.  6. Hypertension.  Blood pressure stable.  Continue Benicar and Coreg.  7. Oral thrush, likely secondary to steroids, nystatin swish and      swallow.  8. Prophylaxis.   Heparin t.i.d.  9. Schizophrenia.  Continue Risperdal.  10.Fluids, electrolytes, nutrition, gastrointestinal.  Hep-Lock IVF,      heart healthy diet.   DISPOSITION:  Pending clinical workup.      Vic Blackbird, MD  Electronically [place] A. Walker Kehr, M.D.  Electronically  Signed    KD/MEDQ  D:  08/14/2008  T:  08/15/2008  Job:  [id number]

## 2010-08-21 NOTE — H&P (Signed)
[name], [name] NO.:  [id number]   MEDICAL RECORD NO.:  [id number]          PATIENT TYPE:  INP   LOCATION:  2911                         FACILITY:  [facility]   PHYSICIAN:  Jamal Collin. Hensel, M.D.DATE OF BIRTH:  1960/04/20   DATE OF ADMISSION:  09/10/2008  DATE OF DISCHARGE:                              HISTORY & PHYSICAL   PRIMARY CARE PHYSICIAN:  Danise Mina at [facility].   CHIEF COMPLAINT:  Respiratory distress.   HISTORY OF PRESENT ILLNESS:  Rachel [name] is a 50 year old female with  stage IV pulmonary sarcoidosis presenting with respiratory distress.  She was recently discharged to [facility] on Aug 30, 2008,  after CHF exacerbation.  During that stay, she had a cardiac cath that  showed an ejection fraction 55% with normal wall motion and normal  coronary arteries.  She does have moderate to severe pulmonary  hypertension.  She was treated with Lasix, diuresed 6 L and was at her  dry weight of 75 kg prior to discharge.  She was not discharged with  Lasix.  Since then she has noticed increased swelling in her legs and  difficulty breathing.  Upon presentation to the emergency department  tonight, she was found to be tachycardiac and tachypneic with O2 sat 72%  on 5 L nasal cannula.  Chest x-ray showed moderate CHF superimposed on  severe pulmonary fibrosis with bilateral pleural effusions.  She was  placed on BiPAP and is now sating in mid 90s with the respiratory rate  of 22-44.  She was also given her first dose of Lasix in the emergency  department.  The patient endorses fever, chills, and chest pain  described as tightness, orthopnea, cough, nausea, and dizziness as well  as lower extremity swelling.  She denies headache, vomiting, weight  gain, abdominal pain, and lower extremity pain.   MEDICATIONS:  1. Albuterol 2 puffs q.4 h. as needed for shortness of breath.  2. Coreg 3.125 mg p.o. b.i.d.  3. Nasonex 50 mcg  2 sprays in both nostrils once a day as needed.  4. Nexium 40 mg 1 capsule daily.  5. Risperdal 0.25 mg 1 by mouth at bedtime.  6. Flovent 110 mcg 1-2 puffs b.i.d.  7. Revatio 20 mg 1 by mouth t.i.d.   ALLERGIES:  1. DEMEROL.  2. ULTRAM.  3. DOXYCYCLINE.  4. MORPHINE.   PAST MEDICAL HISTORY:  1. Pulmonary sarcoidosis with the home oxygen requiring 6 L.  2. Schizophrenia.  3. Sciatica.  4. Fatigue.  5. Anemia.  6. Somatization disorder.  7. Allergic rhinitis.  8. Panic attacks.  9. Hypertension.  10.Congestive heart failure with an EF of 55%.  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  12.Anxiety.   FAMILY HISTORY:  Father died in his 34s of lung cancer.  Her mother has  borderline diabetes, hypertension, and congestive heart failure.   SOCIAL HISTORY:  The patient has a limited history of alcohol abuse, but  quit in 1994.  She also has a history of remote tobacco abuse, quit in  1994.  The patient does  live with her daughter.  She is currently not  working.   REVIEW OF SYSTEMS:  Negative for HPI.   PHYSICAL EXAMINATION:  VITAL SIGNS:  Blood pressure 122/82, pulse 115,  respiratory rate 46, O2 96% on BiPAP.  GENERAL:  She is alert, well developed, well nourished, well hydrated,  and anxious appearing, on BiPAP.  HEENT:  Normocephalic and atraumatic.  Pupils are equally round and  reactive to light.  Extraocular muscles are intact.  Right and left ears  are normal.  Nose, no external deformity.  Pharynx is pink and dry.  NECK:  Supple with no masses.  No JVD.  LUNGS:  Occasional crackles and supraclavicular retractions.  No  wheezing.  She is wearing a BiPAP mask.  HEART:  Tachycardiac with regular rhythm.  ABDOMEN:  Soft, nontender, nondistended.  Positive bowel sounds.  EXTREMITIES:  Pulse are 2+ peripheral pulses bilaterally.  Trace  peripheral edema to mid calves.  NEUROLOGIC:  Cranial nerves II-XII are grossly intact.  She has no focal  deficits.  SKIN:  No rashes.   PSYCHIATRIC:  She appears anxious.   IMAGING:  Chest x-ray shows moderate CHF superimposed upon severe  interstitial pulmonary fibrosis and bilateral pleural effusions.  BNP  501.  BMP within normal limits.  Point-of-care cardiac enzymes are  negative.  White blood cell count 10.9, hemoglobin 11.7, platelets 265.  ABG showed pH 7.35, CO2 of 49, O2 117.  EKG showed sinus tachycardia  with no ST changes.   ASSESSMENT AND PLAN:  1. Dyspnea.  The patient is presenting with respiratory distress.      Similar to her previous hospitalization, we will admit her to      telemetry bed and continue BiPAP per Respiratory Therapy with      instructions to wean with parameters to keep her oxygen sats      between 86 and 92%.  We will diurese her with Lasix 80 IV b.i.d.      with parameters to hold if her systolic pressures are less than      110.  We will also add Solu-Medrol to her regimen to treat her      sarcoidosis and acute inflammation component of this respiratory      distress.  The patient does endorse a history of steroid induced      psychosis, so we will watch this very carefully.  As the patient      also endorses fever, chills, and cough, we will cover her with      azithromycin and Rocephin for pneumonia.  Ms. [name] also endorsed      chest pain.  This has been a chronic problem in the past and she      has clean heart catheterization x1 week ago.  We will still cycle      cardiac enzymes x3.  We will obtain a CT angiogram to evaluate for      the possibility of pulmonary embolism.  2. Pulmonary sarcoidosis.  The patient's pulmonologist is [name].      We will hold off on calling him at this time.  3. Schizophrenia.  We will continue the patient's home medications      including Risperdal and monitor for any changes in her mental      status.  4. Anxiety.  The patient has significant anxiety regarding her      breathing.  She states that she does not tolerate any medications  and does not want anything that makes her loopy.  Review e-chart      shows that she has tolerated Ativan in the past and will use this      medication if she continues to have severe anxiety during her stay      with [place].  5. Hypertension.  We will hold the patient's Coreg at this time.  6. Fluids, electrolytes, nutrition/Gastrointestinal.  We will saline      lock IV and give the patient clears to advance her diet as      tolerated.  We will monitor electrolytes closely with high-dose      Lasix.  7. Prophylaxis proton pump inhibitor for peptic ulcer disease and      heparin for deep vein thrombosis.   DISPOSITION:  Pending improvement.      Briscoe Deutscher, MD  Electronically Signed      Jamal Collin. Andria Frames, M.D.  Electronically Signed    EW/MEDQ  D:  09/10/2008  T:  09/11/2008  Job:  [id number]

## 2010-08-21 NOTE — Consult Note (Signed)
[name], [name] NO.:  [id number]   MEDICAL RECORD NO.:  [id number]          PATIENT TYPE:  INP   LOCATION:  4734                         FACILITY:  [facility]   PHYSICIAN:  Champ Mungo. Lovena Le, MD    DATE OF BIRTH:  Jul 03, 1960   DATE OF CONSULTATION:  12/08/2008  DATE OF DISCHARGE:                                 CONSULTATION   PRIMARY CARDIOLOGIST:  [name], MD   ELECTROPHYSIOLOGIST:  Champ Mungo. Lovena Le, MD   PRIMARY CARE PHYSICIAN:  Rachel Blackbird, MD   REASON FOR CONSULTATION:  Shortness of breath/severe pulmonary  hypertension and right-sided heart failure secondary to stage IV  sarcoidosis.   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  The patient recently was seen by Rachel [name] and  reports compliance with all medications including Lasix 40 mg p.o. daily  and Revatio 20 mg p.o. t.i.d.  She also reports compliance with her low-  sodium diet.  She presents with worsening of shortness of breath  especially over the last 24 hours prior to presenting to ED early this  a.m.  The patient also reports mild pleuritic chest pain and low left  flank pain but denies significant weight gain, cough, fever/chills,  nausea/vomiting, change in chronic orthopnea, or any other new  complaints.  Since arrival in the ED, the patient's vital signs have  remained stable.  She is afebrile with mild sinus tachycardia and O2  saturations in  the low to mid 90s on 100% non-rebreather.  Her weight is  down over the course of the last few years approximately 100 plus  pounds.   PAST MEDICAL HISTORY:  1. Sarcoidosis, stage IV.  2. Right-sided heart failure.      a.     Moderate RV dilatation, moderate RV systolic dysfunction.  3. Hypertension.  4. Pulmonary hypertension, severe PA peak at 79 mmHg (per echo November 22, 2008).  5. NICM, normal EF but grade 1 diastolic dysfunction.  6. NSVT, S/P St. Jude ICD.  7. Moderate TR.  8. GERD.  9. Chronic chest pain.  10.Allergic rhinitis.  11.Iron-deficiency anemia.  12.Anxiety.  13.Schizophrenia.   SOCIAL HISTORY:  The patient lives in [place] alone but sometimes her  daughter stays with her.  She is on disability.  She has a 15-pack-year  smoking history, quitting in 1994.  She had a history of EtOH abuse but  also quit in 1994.  Denies illicit drug use or herbal medication.  She  eats a low-sodium, heart healthy diet.  No regular  exercise secondary to  comorbidities.   Mother with diabetes, hypertension, and CHF.  Father deceased from lung  cancer.   REVIEW OF SYSTEMS:  Please see HPI.  All other systems reviewed and were  negative.   CODE STATUS:  Full.   ALLERGIES:  1. VICODIN.  2. DARVOCET.  3. LATEX.  4. GLUCOCORTICOIDS (reported psychotic symptomology).  5. DEMEROL.   MEDICATIONS:  1. Revatio 20 mg p.o. t.i.d.  2. Albuterol inhalers p.r.n.  3. Coreg 6.25 mg p.o. b.i.d.  4. Nexium 40 mg p.o. daily.  5. Risperdal 0.25 mg p.o. daily.  6. Flovent HFA b.i.d.  7. Ativan 1 mg p.o. daily.  8. Lasix 40 mg p.o. daily.  9. Enalapril 2.5 mg p.o. b.i.d.  10.Flonase p.r.n.  11.Tylenol p.r.n.   PHYSICAL EXAMINATION:  VITAL SIGNS:  Temperature 96.3 Fahrenheit, BP  112/79, pulse 94, respiratory rate 20, O2 saturation 95% on 100% non-  rebreather, and weight 67.6 kg.  GENERAL:  The patient is alert and oriented x3, in mild distress.  She  has mild respiratory  distress even at rest and even worse with short  responses.  She is using accessory muscles and appears underweight as  compared to prior exam.  HEENT:  Head is normocephalic and atraumatic.  Pupils equal, round, and  reactive to light.  Extraocular muscles are intact.  Nares are patent  without discharge.  Dentition fair.  Oropharynx without erythema or  exudates.  NECK:  There is no lymphadenopathy, JVD, bruit, or thyromegaly.  HEART:  Rate is regular with audible S1 and S2.  No clicks, rubs,  murmurs, or gallops, but heart sounds are muffled.  Pulses are 2+ in  radials bilaterally and 1+ in dorsalis pedis bilaterally.  LUNGS:  Clear to auscultation bilaterally.  ABDOMEN:  Soft, nontender, and nondistended.  Normal abdominal bowel  sounds.  No rebound.  No guarding.  No hepatosplenomegaly.  EXTREMITIES:  No clubbing or cyanosis.  Trace ankle edema.  MUSCULOSKELETAL:  No joint deformity or effusions.  No spinal  tenderness.  Mild left-sided CVA tenderness.  NEUROLOGIC:  Cranial nerves II through XII are grossly intact.  Strength  5/5 in all extremity and axial groups.  Normal sensation throughout and  normal cerebellar function.   RADIOLOGY:  1. Chest x-ray shows CHF superimposed on chronic lung disease.  2. EKG showing sinus tachycardia with a rate of 113, T-wave inversion      in V4, minimal ST depression in V5 and V6, and T-wave inversion      also in III and aVF.  No significant Q-waves.  No evidence of      hypertrophy.  Intervals within normal limits.  Questionable      ischemic changes are different from EKG, September 11, 2008.   LABORATORY DATA:  WBC 11.1 with neutrophils at 81%, HGB 12.6, HCT 37.9,  and PLT count 218.  Sodium 137, potassium 3.3, chloride 102, CO2 of 25,  BUN 11, creatinine 0.87 and glucose 149.  Liver function tests within  normal limits.  Calcium 9.3.  BNP elevated at 598.  Point-of-care  markers with minimally elevated troponin at 0.17, but first set of   cardiac enzymes totally negative.  Urinalysis was straw colored,  otherwise within normal limits.   ASSESSMENT AND PLAN:  Rachel [name] is a 50 year old African American female  with a known history of severe pulmonary hypertension and right-sided  heart failure secondary to stage IV sarcoidosis who presents with  recurrence of her worsening  of shortness of breath.  1. End-stage congestive heart failure, right greater than left.  2. 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  3. Pulmonary hypertension secondary to end-stage congestive heart      failure and end-stage sarcoid lung disease.  4. Pulmonary/cardiac cachexia.  5. History of schizophrenia (since Dr. Lovena [name] has followed this      patient in June, not seen any evidence of this).   RECOMMENDATIONS:  Agree with diuresis and continuation of Revatio.  Dr.  Aundra [name] will follow up tomorrow.  She may be close to considering  hospice/palliative care.  Thank you for the consult.      Rachel [name], PAC      Champ Mungo. Lovena Le, MD  Electronically Signed    MS/MEDQ  D:  12/08/2008  T:  12/09/2008  Job:  [id number]

## 2010-08-21 NOTE — Consult Note (Signed)
NAMESHAMICA, Rachel [name] NO.:  [id number]   MEDICAL RECORD NO.:  [id number]          PATIENT TYPE:  INP   LOCATION:  4705                         FACILITY:  [facility]   PHYSICIAN:  [name], MD      DATE OF BIRTH:  11/11/1960   DATE OF CONSULTATION:  08/23/2008  DATE OF DISCHARGE:                                 CONSULTATION   PRIMARY CARDIOLOGIST:  Champ Mungo. Lovena Le, MD   PRIMARY CARE PHYSICIAN:  [name] at [facility].   CHIEF COMPLAINT:  Chest pain.   HISTORY OF PRESENT ILLNESS:  Rachel [name] is a 50 year old female with no  history of coronary artery disease.  She developed chest tightness at  rest and with exertion.  She would get these symptoms 2 or 3 times a  day.  Her symptoms began approximately 2 weeks ago prior to her most  recent hospitalization.  During that hospitalization, she had negative  cardiac enzymes and was to follow up with Cardiology as an outpatient.  Rachel [name] states that her symptoms are sometimes worse with deep  inspiration.  She states that if she leans forward, her symptoms improve  some.  At worst, it reaches 9/10.  She has had chest pain in the past,  but this chest pain is different.  She gets shortness of breath,  weakness, and feels lightheaded with occasionally seeing stars.  She  also has left arm weakness with this.  She describes the pain as a  throbbing pain.  She has had other chest pain that is from coughing and  from her sarcoid, but this is different.  Currently, her pain is in 8/10  and she states she has been hurting all day.  Of note, she was given  Tylenol and this has helped.   PAST MEDICAL HISTORY:  1. Pulmonary sarcoid, on home O2 at 6 L.  2. Status post Myoview in 2004 showing no scar, no ischemia, EF of      22%.  3. Status post cardiac MRI in December 2004 showing no evidence of      myocardial sarcoid and an EF of 40%.  4. Status post echocardiogram in January 2010 showing an EF of 40%  with no wall motion abnormalities and a PAS of 50-55.  5. History of shingles.  6. Hypertension.  7. History of somatoform disorder.  8. Sciatica.  9. Anxiety/panic disorder/schizophrenia.  10.Anemia.  93.JQZESPQ systolic CHF.  33.AQTMAUQ of nonsustained VT with syncope status post St. Jude AICD.  13.Gastroesophageal reflux disease.  14.Allergic rhinitis.   SURGICAL HISTORY:  1. She is status post St. Jude AICD in December 2004.  2. Bilateral tubal ligation.   ALLERGIES:  The patient is allergic or intolerant to DARVOCET, HIGH-DOSE  STEROIDS with psychosis, VICODIN, DEMEROL, ULTRAM, LATEX.   CURRENT MEDICATIONS:  1. Tylenol q.8 h.  2. Ventolin MDI.  3. Coreg 12.5 mg b.i.d.  4. Lasix 40 mg IV daily.  5. Benicar 20 mg nightly.  6. Potassium supplementation p.r.n.  7. Aldactone 25 mg daily.  8. Flonase daily.  9. Flovent b.i.d.  10.Heparin q.8 h.  11.Nystatin q.i.d.  12.Risperdal 0.5 mg nightly.   SOCIAL HISTORY:  She lives in [place] with her daughter.  She quit  tobacco and EtOH in 1994 with a 20-pack year history.  She works as an  Agricultural engineer.   FAMILY HISTORY:  Her father died at 62 with cancer and no heart disease,  and her mother is alive with heart failure history.  No siblings have  heart disease.   REVIEW OF SYSTEMS:  She has had problems with blurred vision.  She has  had problems with fatigue.  She has chronic dyspnea on exertion which  has been worse recently.  She has also had more lower extremity edema,  orthopnea, and PND.  She denies fevers or chills.  She coughs  frequently.  She denies melena or any other bleeding issues.  Full 14-  point review of systems is otherwise negative.   PHYSICAL EXAMINATION:  VITAL SIGNS:  Temperature is 97.3, blood pressure  93/61, pulse 96, respiratory rate 21, O2 saturation 92% on 8 L.  GENERAL:  She is a well-developed [place] American female who is in  respiratory distress.  HEENT:  Normal.  NECK:   There is no bruit, no thyromegaly, and she has significant JVD.  CARDIOVASCULAR:  Heart is regular in rate and rhythm with a systolic  murmur and a possible S3.  CHEST:  She has decreased breath sounds in the bases with rales and some  crackles.  SKIN:  No rashes or lesions are noted.  ABDOMEN:  Soft and she has tenderness in the mid epigastric area.  She  has active bowel sounds.  EXTREMITIES:  There is trace to 1+ edema with distal pulses intact in  all four extremities.  MUSCULOSKELETAL:  There is no joint deformity or effusions and no spine  or CVA tenderness.  NEUROLOGIC:  She is alert and oriented.  Cranial nerves II through XII  grossly intact and no focal deficits noted.   EKG is sinus tachycardia, rate 105 with PVCs.  She has nonspecific ST  and T-wave changes.   Chest x-ray shows stable chronic changes.  No acute disease.   LABORATORY DATA:  Hemoglobin 11.5, hematocrit 35.8, WBCs 10.0, platelets  260.  Sodium 145, potassium 3.4, chloride 101, CO2 of 34, BUN 13,  creatinine 0.89, glucose 110.  CK-MB and troponin I negative x3.  BNP  914.   IMPRESSION:  Rachel [name] was seen today by Dr. Aundra Dubin.  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  She has had chest pain chronically for  years, but she says this feels different.  She describes it as a  squeezing and states that last for about 30 seconds and then resolves.  It occurs multiple times in a day.  1. Congestive heart failure.  She is quite volume overloaded.  Her JVP      is 14-16 cm with an RV heave.  She certainly has right heart      failure and likely has significant pulmonary hypertension.  This is      probably mixed from both left ventricular failure and lung disease.      We will repeat an echocardiogram to reassess her right ventricular      function and PA pressures.   We will increase her Lasix to diurese  her more aggressively as she is very overloaded.  She will be      started on Lasix 40 mg IV q.8 h. and reassess as needed.  She will      be continued on her current doses of Coreg, olmesartan, and      spironolactone.  We will consider a right heart cath eventually      this admission after diuresis.  If there is a significant component      of pulmonary arterial hypertension versus pulmonary venous      hypertension, we could try use of pulmonary vasodilators such as      sildenafil.  2. Chest pain:  I do not think this is due to ischemic heart disease.      She has had this chronically.  She is very worried that she has      coronary artery disease.  We will see if her chest pain resolves      with diuresis.  If not, we may need to do a stress Myoview.  3. History of nonsustained ventricular tachycardia status post      implantable cardioverter-defibrillator:  She has had no discharges      and she is on a beta-blocker, so no further intervention is      indicated at this time.  We will continue to follow her closely      with you.      Rosaria Ferries, PA-C      [name], MD  Electronically Signed    RB/MEDQ  D:  08/23/2008  T:  08/24/2008  Job:  [id number]   cc:   [name]

## 2010-08-21 NOTE — Discharge Summary (Signed)
NAME:  Rachel [name], Rachel [name] NO.:  [id number]   MEDICAL RECORD NO.:  [id number]          PATIENT TYPE:  INP   LOCATION:  4481                         FACILITY:  [facility]   PHYSICIAN:  Talbert Cage, M.D.DATE OF BIRTH:  1960-07-13   DATE OF ADMISSION:  08/11/2008  DATE OF DISCHARGE:  08/16/2008                               DISCHARGE SUMMARY   PRIMARY CARE PHYSICIAN:  Wylene Simmer, MD at [facility].   CARDIOLOGIST:  Fransico Him, MD at [facility].   PULMONOLOGIST:  Kasandra Knudsen. Annamaria Boots, MD, FCCP, FACP   CARDIOLOGIST FOR HER PACEMAKER:  Champ Mungo. Lovena Le, MD at [facility].   DISCHARGE DIAGNOSES:  1. Stage IV sarcoidosis with 6 liters of O2 at baseline.  2. Schizophrenia.  3. Hypertension.  4. Panic attacks.  5. Generalized anxiety disorder.  6. Cardiomyopathy, status post implantable cardioverter-defibrillator.  7. Gastroesophageal reflux disease.  8. Alfonse Ras tenosynovitis.  9. Anemia.  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  11.Allergic rhinitis.  12.Congestive heart failure with ejection fraction of 40%.  13.Sciatica.  14.Back pain.  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   DISCHARGE MEDICATIONS:  1. Coreg 12.5 mg p.o. b.i.d.  2. Flovent 2 puffs inhaled b.i.d.  3. Benicar 5 mg p.o. daily.  4. Nexium 40 mg p.o. daily.  5. Lasix 40 mg p.o. daily as needed for leg swelling.  6. Albuterol inhaled 2 puffs using inhaler q.4-6 h. p.r.n. for      shortness of breath.  7. Nasonex 50 mcg spray in each nostril once daily as needed.  8. Risperdal 0.5 mg p.o. daily.  9. Flovent 110 mcg 1-2 puffs b.i.d.  10.Ibuprofen 600 mg q.6 h. p.r.n. pain.  11.Albuterol nebulizer q.4 h. p.r.n. shortness of breath.  12.Benicar 20 mg p.o. daily.  13.Prednisone 10 mg p.o. once daily.  14.Niaspan 6 mL swish and swallow 4 times daily until 2 days after      oral thrush is cleared.   CONSULTATIONS:  1. Pulmonology, the patient was seen by [name].  2.  Cardiology, the patient was seen by [facility] for her ICD.  3. Phone conversation with Dr. Radford Pax from [facility].   PROCEDURES:  Chest x-ray on Aug 11, 2008, that showed stable pattern of  extensive fibrosis compatible with known end-stage sarcoidosis.  No  definitive acute disease.  Stable cardiomegaly.   PERTINENT LABORATORIES:  Two sets of serial cardiac enzymes were  negative.  BMET was within normal limits with the exception of an  elevated CO2 of 34.  BNP of 1179.  CMET that showed an elevation of CO2  as noted early of 34, otherwise within normal limits.   BRIEF HOSPITAL COURSE:  This is a 50 year old female with a history of  stage IV sarcoidosis who was admitted for shortness of breath.  1. Shortness of breath.  The patient requires 6 L of O2 at baseline.      She came in with shortness of breath.  We had concern that this was      related to cardiac issues or pulmonary issues.  Given that  her      chest x-ray was negative for any infectious process, we got a BNP      level that was elevated.  We gave the patient gentle diuresis with      Lasix 40 mg IV x1 and continued her O2 at baseline with 6 L.  If      her shortness of breath did not improve, we had low threshold to      obtain a CT angiogram, although her Well score was currently at 0      on admission.  The patient returned to baseline during this      hospitalization.  No further workup for pulmonary was done at this      time.  [name] was consulted to see the patient secondary to her      sarcoidosis disease and he did not make any changes to her      medications.  He did recommend that she have a pulmonary function      test and this can be done on an outpatient basis.  2. Chest pain.  The patient had some atypical and substernal chest      pain most likely pleuritic in nature.  I consulted Cardiology and      spoke to Dr. Radford Pax by phone.  Dr. Radford Pax has seen the patient on      an outpatient  basis, but with the last time being at least 2 years      ago, Dr. Radford Pax stated that her chest pain was present previously      and since she had negative cardiac enzyme and a stable EKG with      prolonged QT, Dr. Radford Pax recommended that we can discharge the      patient home and to have her follow up with her.  Dr. Radford Pax also      recommended the patient's ICD be interrogated.  We consulted      [facility] for this, and she was seen by [facility] who stated      Dr. Lovena Le had seen the patient in December 2009 and interrogated      her ICD at that time, which was normal.  Her next appointment with      Dr. Lovena Le is in December 2010.  3. Hypertension.  The patient's systolic blood pressure ranged from      the 90s to 110s and sometimes in the 80s during the      hospitalization.  We held her Benicar.  The patient is to continue      on her Coreg for CHF and she was stable with blood pressures in the      80s to the 100s prior to discharge.  4. Oral thrush.  The patient is to continue on nystatin swish and      swallow until a couple of days after thrush is clear.  5. CHF.  The patient is to continue with her Lasix 40 mg p.o. as      needed for leg swelling.  The patient is instructed to weigh      herself on  a daily basis so that she can monitor her weight      because we explained to her that her CHF and the weight gain      influence can be seen in CHF patients.  6. Schizophrenia.  The patient is to continue on her home meds.  7. Generalized anxiety disorder.  The patient  is to continue on her      home meds.   DISCHARGE INSTRUCTIONS:  The patient is to stop taking Benicar.  The  patient is advised to follow up with Dr. Radford Pax in [facility], to  follow up with [name] for her sarcoidosis and to follow up with Dr.  Lovena Le for her ICD.   DISCHARGE ACTIVITY:  Increase activity slowly.   DIET:  Heart-healthy diet.   WOUND CARE:  Not applicable.   The patient will  have home health RN to come to the house for the first  3 days after discharge to monitor her leg swelling.  The patient will  also have PT to come to the house for physical therapy.   FOLLOW UP APPOINTMENT:  The patient is advised to make an appointment  with [name] at the [facility].   The patient is discharged home in stable medical condition.      Merla Riches, MD  Electronically Signed      Talbert Cage, M.D.  Electronically Signed    CT/MEDQ  D:  08/17/2008  T:  08/18/2008  Job:  [id number]   cc:   [name]

## 2010-08-21 NOTE — Consult Note (Signed)
NAMEMONSERATT, LEDIN NO.:  [id number]   MEDICAL RECORD NO.:  [id number]          PATIENT TYPE:  INP   LOCATION:  4702                         FACILITY:  [facility]   PHYSICIAN:  Clinton D. Annamaria Boots, MD, FCCP, FACPDATE OF BIRTH:  05-Aug-1960   DATE OF CONSULTATION:  04/14/2008  DATE OF DISCHARGE:                                 CONSULTATION   PROBLEM FOR CONSULTATION:  This is a 50 year old woman with acute  dyspneic illness and background of severe sarcoid with cardiomyopathy.   HISTORY:  This 50 year old African American nonsmoking woman is known to  me over many years of followup.  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  Her most recent ACE  level known to me was 12 early in 2009.  She has had a dilated  cardiomyopathy requiring pacemaker and she has been dependent on home  oxygen with history of significant steroid-induced psychosis.   She now gives me a straightforward history that she had been exposed to  a grandchild in her home with a mild cold syndrome.  About a week prior  to this admission, she developed frontal headache followed rapidly by  progressive postnasal drainage, yellowing of sputum, chest congestion,  cough, and malaise.  She has had some chronic intermittent night sweats.  I do not get a history of clear myalgias now, but apparently she gave  that history on initial admission.   REVIEW OF SYSTEMS:  Headache has resolved.  Her primary complaint is  weakness and generalized malaise, low back pain my kidneys with some  dysuria and orange-colored urine, mild nausea without vomiting, some  tussive mid-sternal soreness without pleuritic chest pain or  palpitation.  She has not seen blood and has not noticed adenopathy or  rash.   PAST HISTORY:  Stage IV sarcoid with ocular involvement.  Pulmonary  function test, January 06, 2008, had shown FEV-1 54% of predicted.  Diffusion capacity 27% of  predicted and probable restriction, although,  she could not maintain an air seal.  Dilated cardiomyopathy with  previous congestive heart failure, defibrillator, nonsustained V-tach  and syncope, history of panic attacks, anxiety, and steroid-induced  psychosis, iron deficiency anemia, previous fractures left arm left  foot, esophageal reflux.  Hospitalization in 2009 for community-acquired  pneumonia.   SOCIAL HISTORY:  No longer married, living alone, nonsmoker, and no  alcohol.  Strong religious faith.   FAMILY HISTORY:  Father died of lung cancer.  Family history of  hypertension, diabetes, and heart failure.   OBJECTIVE:  GENERAL:  Alert woman who recognized me immediately.  VITAL SIGNS:  BP 112/62, pulse 101, temperature 98.9, oxygen saturation  93% on 6 L prongs.  SKIN:  Generalized alopecia with no rash.  ADENOPATHY:  None found at the neck, supraclavicular, or axillary areas.  HEENT:  Oral mucosa clear.  Voice quality normal.  No stridor or neck  vein distention.  CHEST:  Faint diffuse crackles, most prominent posteriorly without  dullness or rub.  No cough elicited by deep breath and no wheeze heard.  HEART:  Sounds regular (pacemaker) without murmur or gallop noted  tonight.  BREAST/PELVIC/RECTAL:  Not examined and noncontributory.  ABDOMEN:  Soft and nontender.  Bowel sounds are present, I did not  appreciate the enlargement of liver or spleen.  EXTREMITIES:  No edema, cyanosis, or clubbing.   LABORATORY:  I reviewed the radiology images showing no significant  change allowing for technique.  There was chronic diffuse fibrotic  scarring consistent with sarcoid.  There is mild cardiac enlargement  without obvious heart failure or pleural effusion seen.   WBC 9900 with hemoglobin 9.4.  BUN 15, creatinine 1.17, and glucose 111.  Liver enzymes are not increased.  Troponin I is 0.01.  There are  cultures pending.   IMPRESSION:  1. I favor this being an acute viral  respiratory infection, possibly      H1N1 flu, superimposed on her significant underlying end-stage      sarcoid and her chronic dilated cardiomyopathy with left      ventricular failure.  2. She is at significant risk for steroid psychosis on systemic      steroids and would recommend rapid conversion to nebulized      Pulmicort, be given on a maintenance basis b.i.d., weaning off of      her Solu-Medrol.  3. Doxycycline is a reasonable antibiotic choice against any possible      susceptible respiratory organism.  She is probably too far into      this illness to benefit from Tamiflu, even if this were confirmed      as H1N1.  4. We can anticipate a slow recovery recognizing her poor baseline      medical condition.   Discharge planning should start early since she lives alone and not  going to be strong for several weeks.   1. I am not sure when she last had an echocardiogram to assess left      and right ventricular function and look for evidence of pulmonary      hypertension.  2. When contacted, I was asked about heart-lung transplant.  This had      been brought up with her years ago and she had not wanted to pursue      the issue then.   Initial impression could be obtained from the lung transplant  coordinator at either [facility] or [facility] if appropriate.      Clinton D. Annamaria Boots, MD, [place] Flood, FACP  Electronically Signed     CDY/MEDQ  D:  04/14/2008  T:  04/15/2008  Job:  [id number]   cc:   Dalbert Mayotte, MD

## 2010-08-24 NOTE — Discharge Summary (Signed)
NAME:  Rachel [name], Rachel [name]                         ACCOUNT NO.:  [id number]   MEDICAL RECORD NO.:  [id number]                   PATIENT TYPE:  INP   LOCATION:  2355                                 FACILITY:  [facility]   PHYSICIAN:  Beatrix Fetters, MD                      DATE OF BIRTH:  1960/12/16   DATE OF ADMISSION:  03/14/2003  DATE OF DISCHARGE:  03/30/2003                                 DISCHARGE SUMMARY   DISCHARGE DIAGNOSES:  1. Pulmonary sarcoid.  2. Dilated cardiomyopathy.  3. Congestive heart failure due to dilated cardiomyopathy.  4. Placement of defibrillator by Dr. Caryl Comes.   DISCHARGE MEDICATIONS:  1. Altace 2.5 daily.  2. Carvediol 3.125 b.i.d.  3. Lasix 20 mg 1 every other day.  4. Prednisone 20 mg 1 p.o. with meal daily.  5. Calcium carbonate 1 gram p.o. t.i.d. with meals.  6. Vitamin D 2,000 international units 1 daily, taken with calcium.  7. Paroxetine 20 mg 1 daily.  8. Protonix 40 mg 1 daily.  9. Lanoxin 0.125 mg 1 daily.  10.      Percocet 5/325 1 to 2 q. 4 to 6 hours p.r.n. pain.  11.      Lorazepam 1 mg 1 p.o. q. 12 hours as needed for anxiety.   HISTORY OF PRESENT ILLNESS:  This is a 50 year old African-American female  who presented to [facility] on March 14, 2003 complaining of  progressive shortness of air and orthopnea, especially over the 2 days prior  to admission. She complained at that time of dyspnea even with mild exertion  including conversation. She had had a nonproductive cough with minimal  brownish sputum over the 2 weeks prior to admission and stated that her  chest felt tight. She denied anginal type of chest pain at that time. She  did complain of paroxysmal nocturnal dyspnea and stated that she slept  propped up on pillows with the window open.   PHYSICAL EXAMINATION:  VITAL SIGNS:  Temperature 97.9, heart rate 102, blood  pressure 732 to 202 systolic over 94 to 542 diastolic. Respiratory rate 20  to 25. O2 saturation 82% on room  air and 96% on 3 liters by nasal cannula.  GENERAL:  This is a well nourished, well appearing African-American female  in no apparent distress.  HEENT:  Within normal limits.  CARDIOVASCULAR:  She has regular rhythm. Tachycardiac with no murmurs, rubs,  or gallops.  LUNGS:  Positive for diffuse inspiratory and expiratory wheezes. Lots of  crackles in the right lung base. Otherwise, moderate aeration throughout.  ABDOMEN:  Mildly tender on palpation in the epigastric region.  EXTREMITIES:  The patient had dried healing fascicular lesions consistent  with herpetic lesions in the right deltoid. She also had mild swelling in  the ankles bilaterally that was non-pitting.   LABORATORY DATA:  Sodium 140, potassium 3.3, chloride 106,  bicarb 25, BUN  15, glucose 88, brain natriuretic peptide 416, D-dimer 0.37. Cardiac  enzymes, myoglobin 22. CK MB less than 1. Troponin less than 0.05.   She had a chest x-ray at that time which showed new edema versus infiltrate  on chronic interstitial changes bilaterally.   HOSPITAL COURSE:  1. Pulmonary sarcoid. The patient was seen by Dr. Keturah Barre in the past,     Pulmonologist. As such, he was consulted. In his consultation, he     recommended that patient had stage 4 sarcoid that had not changed much by     initial radiologic studies and felt that her recent dyspnea was likely     due to cardiomyopathy induced by sarcoid. He recommended administration     of steroids at that time. The patient had a 2-D echocardiogram which     showed mild left ventricular dilation, ejection fraction of 20% to 30%     and decreased LV function with global hypokinesis. The patient was then     seen by Dr. Radford Pax of [facility], who recommended Adenosine     Cardiolite study, which showed normal blood pressure response, frequent     unifocal premature ventricular contractions with infusion. No     electrocardiogram changes. The patient was started on Coreg. Was      maintained on steroids. The patient had a history of mood changes     supposedly induced by steroid use in the past. As such, psychiatry was     consulted and contacted Dr. Cy Blamer recommended Zyprexa but the     patient declined administration of this medication. The patient was also     maintained on Altace. The patient had episode of V-tach 19 to 20 beats.     The patient was felt to be at risk for sudden cardiac death by Dr. Radford Pax     of Cardiology. As such, she was scheduled for cardiac catheterization     that showed ejection fraction of 20%,  mild mitral regurgitation, severe     left ventricular dysfunction, normal coronary arteries. As such, it was     felt that the patient congestive heart failure was not due to ischemia.     Therefore, sarcoid was felt to be the most likely and it was felt that     electrophysiology may be an option for this patient. To that end, Dr.     Caryl Comes spoke with the patient about use of Amiodarone versus internal     defibrillator to control potential arrhythmias. The patient underwent MRI     of the heart to potentially diagnose the presence of sarcoid in the     cardiac tissue. This MRI showed changes in epicardial area, consistent     with sarcoid but not diagnostic. The patient was in favor of placement of     an internal defibrillatory, as she felt certain that this would provide     her with a great deal of relief and improved cardiac function. The     patient was advised that while insertion of an internal defibrillatory     would regulate the presence of arrhythmias to a certain extent, it would     not necessary improve her dyspnea. On January 26, 2003 the patient had a     [employer]. [employer] medical ICD placed by left subclavian vein. The patient     tolerated the procedure well. Prior to discharge, the patient complained     of  calf pain in the right calf. No evidence of deep vein thrombosis, SVT,    or Baker's cyst bilaterally. The patient was also  seen by nutrition prior     to discharge.   DISPOSITION:  The patient was in good spirits and very motivated to follow  discharge game plan which included followup with Dr. Blima Ledger at  [facility] on April 15, 2003, with the [facility] at  [place] on April 13, 2003 at 9:30 a.m., and with Dr. Lovena Le on June 28, 2003 at 11:30 a.m.   LABORATORY DATA:  Additional discharge lab data on March 28, 2004, the  patient had a B-met that showed sodium of 132, potassium 3.7, chloride 100,  CO2 32, glucose 111, BUN 19, creatinine 0.9, calcium 9.0. On March 28, 2003 the patient had a CBC which showed white blood cell of 7.1, hemoglobin  of 11.1, hematocrit of 32.8 and platelets of 277,000. In addition, the  patient also had PT of 14.2, INR of 1.1, and PTT of 31 on March 28, 2003.                                                Beatrix Fetters, MD    TD/MEDQ  D:  07/08/2003  T:  07/11/2003  Job:  [id number]   cc:   Tarri Fuller D. Young, M.D.  [place]. [place] [address]  Fax: [phone]   Deboraha Sprang, M.D.   Fransico Him, M.D.  [place]. [address], [place]  [place], [place] [address]  Fax: [phone]   Blima Ledger, M.D.  [address] [place], [place] [address]  Fax: [place] Lovena Le, M.D.

## 2010-08-24 NOTE — H&P (Signed)
NAMERIKITA, GRABERT NO.:  [id number]   MEDICAL RECORD NO.:  [id number]          PATIENT TYPE:  WOC   LOCATION:  WOC                          FACILITY:  [facility]   PHYSICIAN:  Elveria Rising. Damita Dunnings, M.D. DATE OF BIRTH:  08/25/1960   DATE OF ADMISSION:  10/17/2005  DATE OF DISCHARGE:  10/17/2005                                HISTORY & PHYSICAL   CHIEF COMPLAINT:  Psychosis.   HISTORY OF PRESENT ILLNESS:  The patient is a 50 year old African American  female with a history of psychosis followed at the __________  [place], who was noted to have a deviation from her typical  behavior this morning.  Evidently, she was found by her daughter and was  disorganized, and she was also down on all 4s barking like a dog.  She  called EMS.  She was brought to the ER here at [facility].  A behavioral  health transfer was arranged, but hypokalemia was noted (the service was  called to admit).   The patient has no suicidal or homicidal intent now.  She does a Soil scientist  for safety.  She is a poor historian but she denies any recent thoughts of a  conspiracy theory and visual hallucinations.  She does hear the voice of the  holy ghost.  It tells me to love and be kind to all people.  She does  fixate on the Bible.  Note that the patient has had a recent change in her  psychiatric medication with the addition of Risperdal, and discontinuation  of the tricyclic.  Now this was related to a previous overdose.  She was  also accusing me of aggressively drinking coffee throughout the exam.   REVIEW OF SYSTEMS:  No fevers known.  CARDIOVASCULAR:  No chest pain.  No  shortness of breath.  PULMONARY:  No cough.  GI:  No nausea.  No vomiting.  GU:  Questionable dysuria this morning.  The patient gives a vague history  of 1 episode of burning with urination.  SKIN:  No rash.  OTHER:  She does  have a positive sinus headache for days.   TREATMENT IN THE EMERGENCY ROOM:   Patient has been given a total of 100 mEq  of KCL by mouth in the ED this morning.   PAST MEDICAL HISTORY:  1. Sarcoidosis with lung involvement.  2. Anxiety.  3. Cardiomyopathy.  4. CHF.  5. Panic attacks.  6. GERD.  7. Allergic rhinitis.  8. Hypertension.  9. Somatization disorder.  10.History of psychosis.  11.History of defibrillator placement.  12.Lactose intolerant.   MEDICATIONS:  1. Albuterol 2 puffs q.4 hours p.r.n.  2. Calcium carbonate with vitamin D t.i.d. with meals.  3. Coreg 6.3 mg p.o. b.i.d.  4. Enalapril 2.5 mg p.o. b.i.d.  5. Ferrous fumarate 200 mg p.o. daily.  6. Lasix 40 mg p.o. b.i.d.  7. Lorazepam 1 mg p.o. b.i.d. p.r.n. anxiety.  8. Nasonex 50 mcg 2 nasal sprays in each nostril per day.  9. Nexium 40 mg p.o. daily.  10.Potassium chloride 40 mg p.o. daily.  11.Risperdal 0.5 mg 1/2 tab p.o. q.a.m.   ALLERGIES:  1. STEROIDS cause psychosis at dose of PREDNISONE 80 mg p.o. daily.  2. DARVOCET causes pruritic rash.   FAMILY HISTORY:  Mother with bipolar/psychiatric disorder, per patient.   SOCIAL HISTORY:  1. She was remarried in 2006.  She lives with her husband and daughter.      She also has an older son, [name].  2. History of alcohol abuse.  Quit years ago.  3. History of tobacco abuse.   PHYSICAL EXAM:  VITAL SIGNS:  Temperature 97.3.  Pulse 103.  Blood pressure  178/74.  Respiratory rate 18.  STO2 is 97% on room air.  GENERAL APPEARANCE:  This patient is in no apparent distress.  She alert and  oriented x3.  She has fluent speech but she perseverates throughout the  exam.  This does occasionally alternate with a flight of ideas.  HEENT:  Normocephalic, atraumatic.  Mucous membranes moist.  Extraocular  movements are intact.  She refuses any other portion of the exam.  She will not let me look in her  ears or get a detailed exam with inspection to palpation.  NECK:  Supple.  No lymphadenopathy appreciated.  CHEST:  Clear to auscultation  bilaterally.  She refuses the skin exam.  LUNGS:  No wheeze.  HEART:  Regular rate and rhythm.  She does have a pacer scar transiently  noted above the upper inch of her gown.  Abdomen is soft.  She is minimally tender to palpation in the suprapubic  space.  She does have bowel sounds.  She has no CVA pain.  EXTREMITIES:  No edema.  Pulses 2+, radial pulses.  Rectal exam is deferred.  NEUROLOGIC:  Limited by compliance.  Cranial nerves II, III, IV, VI, VII,  VIII, and XI are intact bilaterally.  There is gross motor and sensation is  intact.  Deep tendon reflexes she would not allow me check.  Gait is not  tested.   LABORATORY DATA:  Noted for white count 7.8.  Hemoglobin 11.9.  Potassium  2.9; recheck at 3.1.  LFTs within normal limits.  UA specific gravity 1.024.  Positive hemoglobin.  Small bilirubin, 15 ketones.  Trace leukocyte  esterase.  Microscopic exam shows 11-20 RBCs per high powered field.  Beta  hCG is negative.  UDS negative.  Alcohol negative.  Lipase 26.   ASSESSMENT/PLAN:  1. Patient is a 50 year old [place] American female with an apparent      psychotic state.  She is hearing voices and has disorganized thoughts,      per her report this morning.  She will need behavioral health as soon      as possible.  See problem #2 below.  She was to be placed on suicide      precautions even though she is not suicidal right now.  It is      reasonable for at least 24 hours.  Gave the patient Ativan and      Risperdal per her home dose.  There is no other source for altered      mental status seen except for problem #8.  See below.  2. Hypokalemia.  She has gotten 100 mEq of potassium in the ED.  We will      replete p.o. today and check her again at 1800.  Recheck tomorrow      morning and adjust her medications as needed.  This  could be due to      noncompliance with the potassium or overuse of Lasix. 3. Heart-healthy diet.  4. Continue Nasonex and check for tenderness to  palpation if allowed.      Tylenol p.r.n.  5. Proton pump inhibitor and Lovenox.  6. Coreg, enalapril, and Lasix per home dose for CHS.  7. Albuterol p.r.n. for shortness of breath.  8. Culture of her urine and hold antibiotics for now.  She has no systemic      symptoms.  No fevers.  No chills.  No CVA pain.  9. Admit.  Patient will need followup with behavioral health.      Elveria Rising Damita Dunnings, M.D.     GSD/MEDQ  D:  02/15/2006  T:  02/16/2006  Job:  [id number]   cc:   Randa Spike, MD

## 2010-08-24 NOTE — H&P (Signed)
[name], Rachel [name].:  [id number]   MEDICAL RECORD NO.:  [id number]          PATIENT TYPE:  INP   LOCATION:  2040                         FACILITY:  [facility]   PHYSICIAN:  Billey Chang, M.D.     DATE OF BIRTH:  Aug 23, 1960   DATE OF ADMISSION:  12/30/2004  DATE OF DISCHARGE:                                HISTORY & PHYSICAL   NO DICTATION      Sarita Bottom, M.D.    ______________________________  Billey Chang, M.D.    JP/MEDQ  D:  01/01/2005  T:  01/02/2005  Job:  [id number]

## 2010-08-24 NOTE — Discharge Summary (Signed)
[name], [name] NO.:  [id number]   MEDICAL RECORD NO.:  [id number]          PATIENT TYPE:  INP   LOCATION:  5740                         FACILITY:  [facility]   PHYSICIAN:  Kaylyn Layer, M.D.  DATE OF BIRTH:  Sep 04, 1960   DATE OF ADMISSION:  12/26/2005  DATE OF DISCHARGE:  12/29/2005                                 DISCHARGE SUMMARY   ATTENDING PHYSICIAN:  Dr. Laneta Simmers Chamb[place]   DISCHARGE DIAGNOSES:  1. Acute psychotic episode.  2. Stage IV sarcoidosis.  3. Cardiomyopathy and congestive heart failure.  4. History of anxiety, panic attacks, __________ disorder.  5. History of hypertension.   DISCHARGE MEDICATIONS:  1. Albuterol 2 puffs q.4h. p.r.n.  2. Coreg 6.25 mg p.o. b.i.d.  3. Elavil 25 mg p.o. nightly.  4. Enalapril 2.5 mg p.o. b.i.d.  5. Lasix 40 mg p.o. b.i.d.  6. Protonix 40 mg p.o. daily.  7. K-Dur 40 mEq p.o. daily.  8. Ativan 1 mg p.o. b.i.d.  9. Paxil 50 p.o. daily.   HISTORY OF PRESENT ILLNESS:  [name] is a 50 year old female who was  transferred to [facility] from [facility] for a psychotic delusion after  apparently taking the wrong medicine.  She was having auditory  hallucinations, visual hallucinations, and was very agitated.   Discharge diagnosis #1, psychotic episodes:  The patient continued to have  hallucinations and she was very agitated for about 24 hours.  She was  controlled with Haldol 1 mg IV t.i.d. and 5 mg p.o. p.r.n., as well as  Ativan 1-2 mg p.o. q.4h. as needed.  Psychiatry was consulted.  When they  originally saw her on the 21st, they thought she should be transferred to  [facility] __________.  She was reevaluated by a psychiatrist  on September 22nd when these delusions and hallucinations had cleared, and  they recommended outpatient followup since she was now stable and not having  any suicidal or homicidal ideation.  At discharge, we had decreased her  Elavil to 25 mg at bedtime,  continued her on Paxil 50 mg daily, and  continued Ativan 1 mg b.i.d.   Stage IV pulmonary sarcoidosis:  Patient remained stable on a baseline O2  requirement throughout the hospitalization.  BNP was at its baseline as  well.  We continued her on albuterol.   CHF and known cardiomyopathy:  Continued her on her home regimen of Coreg  3.125 mg b.i.d., Enalapril 2.5 b.i.d., and Lasix 40 b.i.d., and she remained  well compensated.   Hypokalemia:  Patient's potassium on admission was 2.9.  This was repeated.  Magnesium level was normal.  Potassium on the 21st was 3.8.  We continued  her on 40 mEq b.i.d. of potassium.   DISPOSITION:  The patient was discharged home in improved and stable  condition.  She is to follow up with her primary care doctor as previously  scheduled.      Kaylyn Layer, M.D.     KS/MEDQ  D:  12/29/2005  T:  12/29/2005  Job:  [id number]   cc:  Dr. Theodosia Blender, [facility]

## 2010-08-24 NOTE — Discharge Summary (Signed)
NAMEPARRIS, SIGNER NO.:  [id number]   MEDICAL RECORD NO.:  [id number]           PATIENT TYPE:   LOCATION:                                 FACILITY:   PHYSICIAN:  Randa Spike, MD       DATE OF BIRTH:   DATE OF ADMISSION:  12/11/2005  DATE OF DISCHARGE:  12/16/2075                                 DISCHARGE SUMMARY   REASON FOR ADMISSION:  Chest pain and shortness of breath.   PRIMARY DISCHARGE DIAGNOSES:  1. Atypical chest pain/musculoskeletal origin.  2. Congestive heart failure exacerbation.   SECONDARY DIAGNOSES:  1. 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  2. 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  3. Somatoform disorder.  4. Anxiety/panic attacks.  5. Chronic pain syndrome.  6. History of ventricular tachycardia status post [employer] single-chamber      defibrillator in 2004.  7. Gastroesophageal reflux disease.  8. Iron deficiency anemia.   DISCHARGE MEDICATIONS:  1. Elavil 50 mg p.o. q.h.s.  2. Lasix 40 mg p.o. b.i.d.  3. Nexium 40 mg p.o. daily.  4. Paxil 15 mg p.o. daily.  5. Potassium chloride 40 mg daily.  6. Ativan 1 mg b.i.d. p.r.n.  7. Coreg 3.125 mg p.o. b.i.d.  8. Enalapril 2.5 mg p.o. b.i.d.  9. Albuterol inhaler two puffs q.4 h. p.r.n.  10.MiraLax 17 gm diluted in 6 ounces of Gatorade daily p.r.n.   DISPOSITION:  Patient was discharged home in improved and stable condition.  She is to follow up at [facility] on September 17 at 3:00  p.m.  She was also asked to call [facility] for an appointment with  Dr. Radford Pax in two weeks after discharge.   ISSUES TO FOLLOW:  Patient was asked to keep a log with daily weights.  She  also needs to have monitor of her electrolytes, as her Lasix home dose was  increased from 20 daily to 40 b.i.d. and also her potassium chloride dose  was increased from 20 daily to 40 daily. also patient's metoprolol was  changed for Coreg 3.125 mg b.i.d. and I  will follow up cardiology's  recommendations about the need for titration of this medication.  Patient's  enalapril home dose of 5 mg daily was split to 2.5 mg b.i.d.   DISCHARGE LABS:  CBC:  White blood cell count 4.7, hemoglobin 9.9,  hematocrit 30.4, platelets 171.  Sodium 134, potassium 3.7, chloride 96, CO2  33, BUN 8, creatinine 1, glucose 84, calcium 8.4, BNP on September 8 was  227.   STUDIES:  Patient had a chest x-ray on September 4 that showed stable  cardiomegaly and chronic interstitial lung disease, no acute findings.  She  had a second chest x-ray on September 5 that showed sarcoid without acute  findings and cardiomegaly.  Patient had a D-dimer on admission that was  elevated and subsequently had an angio CT scan on September 5 that showed no  pulmonary embolus, a small right pleural effusion, a small pericardial  effusion and new ascites, end-stage  sarcoid, cardiomegaly with marked right  atrial enlargement and passive congestion of the liver.  Patient had a 2-D  echo on September 6 that showed mild decreased ventricular systolic function  with a left ventricular ejection fraction estimated 45%, no left ventricular  wall motion abnormalities, mild diffuse left ventricular hypokinesis, aortic  valve thickness mildly increased, mild mitral valve regurgitation, left  atrial moderately dilated, flattening of the intraventricular septum  consistent with volume and pressure overload of the right ventricle and  moderate dilation of the right ventricle, moderate pulmonic regurg, severe  tricuspid valve regurg, right atrial markedly dilated, inferior vena cava  moderately dilated and trivial pericardial effusion.   PENDING LABS:  None.   HOSPITAL COURSE:  1. Chest pain.  Patient had no new changes in her EKG, she had normal      cardiac enzymes x3 and her pain was able to be reproduce on palpation.      It was believed that was of musculoskeletal origin.  Also, patient has       chronic pain syndrome and this atypical chest pain  is not unusual for      her and has been a frequent reason of hospital admission.  Patient's      discharge BNP was 227.  2. CHF exacerbation.  Patient has cor pulmonale and she was briefly      edematous.  She was also showing liver congestion on her CT scan and it      is believed that she has right-sided heart failure.  Patient's BNP on      admission was 861.  She was given 80 mg of IV Lasix on admission then      she was placed on 40 mg IV Lasix until September 7 then she was changed      to p.o. Lasix 40 mg daily.  She had excellent diuresis of 4 liters in 3      1/2 days.  She did not have significant pulmonary edema, her lungs      remained clear to auscultation and her lower extremity edema completely      resolved.  She still had marginal edema of the chest wall including her      right breast and for this reason she also has a followup mammography      this month.  As per patient's cardiologist, her metoprolol was changed      to Coreg, she was started on Coreg two days prior to discharge at a      dose of 3.125 mg b.i.d., her enalapril was restarted a day prior to      discharge on 2.5 mg b.i.d. and was discharged on Lasix 40 mg twice a      day, this is probably a dose that she needs to be kept on and I will      continue to monitor her in the clinic to see if her electrolytes remain      stable.  Her blood pressure remained stable during this hospitalization      ranging between [id number] systolic and diastolics between 60 and 81.  3. End-stage sarcoidosis.  I think patient is stable.  She is advised to      use her permanent oxygen of four liters portable.  I did not think that      patient had a sarcoid flare-up, but she is certainly in progressive      deconditioning secondary to her sarcoid.  If she  is willing to regain      her strength and motivation, she could probably benefit to continue     pulmonary rehab which  was helping her in the past about her learning to      cope with her chronic dyspnea and respiratory symptoms.  She did not      require any steroids during this hospitalization.  She follows with Dr.      Annamaria Boots and I will follow Dr. Janee Morn recommendation for further      management of her sarcoid.  4. Idiopathic dilated cardiomyopathy believed to be secondary to      sarcoidosis with an ejection fraction of 45% based on a 2-D echo done      during this admission.  5. Anemia.  Patient has been diagnosed in the past with iron deficiency      anemia.  Her hemoglobin on admission was 11.8.  She was discharged with      a hemoglobin of 9.9.  She also complained of epigastric pain during      this admission and had negative guaiac stools x3, but I will recheck H.      pylori in clinic, as patient has had this pathogen in the past.  I have      recommended her to continue to use Nexium 40 daily and will consider GI      referral for an upper EGD.  We have recommended to continue to use iron      325 mg p.o. b.i.d..  6. Somatoform disorder.  Patient usually with multiple medical complaints,      especially pain symptoms in different areas of her body and also she      complains of symptoms of sexual dysfunction, all of this make her      compatible with a diagnosis of somatoform disorder.  She also has a      history of anxiety and panic attacks.  I think that she remained      somehow stable during this hospital stay.  It was not unusual still for      her to have different complaints unrelated to the reason for her      hospital admission, but I decided to continue her on Elavil 50 mg daily      and Ativan 1 mg b.i.d. p.r.n.  I have recommended patient to continue      with counseling and to follow up at behavioral medicine.  I will also      continue to see her on a regular basis in the clinic, I am going to see      her weekly for the next month and then when she is more stable, then I       will see her bi-weekly.  7. History of ventricular arrhythmia.  Patient had 2 interrogations of her      ICD during this hospital stay.  She continued to complain about feeling      her defibrillator firing, but so far there was no evidence of VT or VF,      or pacings on the interrogation of her ICD.  She did have a tachycardia      with one 5-beat run during the second interrogation of her      defibrillator that was believed to be secondary to hypokalemia.  Her      potassium was replaced.  She was discharged with the potassium level of  3.7.  I increased her dose of K to 40 mg daily and will continue to      check weekly in clinic and the goal based on cardiology's      recommendation is to keep her potassium above 4 to avoid any risk      factors for ventricular tachycardia or ventricular arrhythmia.  8. On the day of discharge, patient complained of epigastric pain, she has      a history of GERD and she is taking Nexium, though patient seemed     comfortable talking with her husband and with me and even eating and      she did not complain of nausea or vomiting.  Because she had some drop      in her hemoglobin, I will refer her to GI for a possible EGD, but it is      not unusual for Ms. Helton to have several new complaints or old      complaints that exacerbate during her discharge date.  She also      complained that she did not have any oxygen to go home with.  I called      care management and they called [facility] and they said that      she had a enough liquid oxygen at home, she was not due for a refill so      the husband went home to refill her portable O2 and her discharge is      pending on her family to bring her portable O2.  Otherwise, her vitals      were stable, she was ambulating and in much improved condition.      Randa Spike, MD     AM/MEDQ  D:  12/15/2005  T:  12/15/2005  Job:  [id number]

## 2010-08-24 NOTE — Assessment & Plan Note (Signed)
[facility]                             PULMONARY OFFICE NOTE   Rachel [name], Rachel [name]                         MRN:          [id number]  DATE:03/21/2006                            DOB:          [birth year]    PROBLEM:  1. Sarcoid, stage 4, with old scarring, clinically stable.  2. Dilated cardiomyopathy/automatic implanted cardioverter      defibrillator.  3. Class 2 heart failure, ejection fraction greater than 50%.  4. Allergic rhinitis.  5. Anxiety/panic attacks.  6. Iron deficiency anemia.   HISTORY:  Difficult year.  She separated from her husband, and has  recently been hospitalized at [facility], apparently with  depression.  Now at home on 4 L.  She resents the oxygen, but accepts it  as necessary.  Cough brings up some dry, thick sputum.  No chest pain  nor fever.  No acute events.   MEDICATIONS:  1. Fluticasone nasal spray.  2. Oxygen at 4 L.  3. Enalapril 2.5 mg.  4. Lasix 40 mg b.i.d.  5. Coreg 6.25 mg b.i.d.  6. Nexium 40 mg.  7. Calcium.  8. Risperdal 0.25 mg b.i.d. and 2 mg at h.s.  9. Albuterol rescue inhaler.  10.Occasional Tylenol #3.  11.K-Dur 20 mEq.   ALLERGIES:  DRUG INTOLERANCE TO DARVOCET.   She tells me that she has occasional sharp twinges still near her  pacemaker pocket.  She asks permission to exercise which we discussed.  Got flu shot.   OBJECTIVE:  Weight 158 pounds, which is down from 183 pounds in August.  Quiet chest.  Heart sounds regular without murmur or gallop.  She is  wearing her oxygen.  There is no edema.   IMPRESSION:  1. Sarcoid, I think, is a stable issue, significant because of the      scarring it has left her.  2. Cardiomyopathy with heart failure, now under much better control      than last visit.   PLAN:  1. Walk for endurance.  2. May try turning oxygen down to 2 L at rest, but sleep on 3-4 L, and      use 3-4 L during any      activity.  3. Schedule return in 4 months,  earlier p.r.n.     Clinton D. Annamaria Boots, MD, [place]ade Flood, FACP  Electronically Signed    CDY/MedQ  DD: 03/21/2006  DT: 03/22/2006  Job #: [id number]   cc:   [place]. Lovena Le, MD

## 2010-08-24 NOTE — Discharge Summary (Signed)
Rachel [name], YEARY NO.:  [id number]   MEDICAL RECORD NO.:  [id number]            PATIENT TYPE:   LOCATION:                                 FACILITY:   PHYSICIAN:  Billey Chang, M.D.     DATE OF BIRTH:  09-24-1960   DATE OF ADMISSION:  08/25/2005  DATE OF DISCHARGE:  08/28/2005                                 DISCHARGE SUMMARY   DISCHARGE DIAGNOSIS:  1.  Abdominal pain.  2.  Anxiety.  3.  Sarcoid.  4.  Congestive heart failure.  5.  Hypertension.   PROCEDURES:  None.   DISCHARGE MEDICATIONS:  1.  Nasonex 50 mcg 2 sprays daily.  2.  Enalapril 5 mg daily.  3.  Lasix 40 mg daily.  4.  Toprol XL 25 mg daily.  5.  FeSO4 325 mg daily.  6.  K-Dur 20 mEq daily.  7.  Protonix 40 mg daily.  8.  MS Contin 50 mg b.i.d.  9.  Paxil CR 50 mg daily.  10. Ativan 1 mg b.i.d. p.r.n. anxiety.  11. MSIR 15 mg q.4-6 h. p.r.n. pain.  12. Albuterol MDI 2 puffs q.4 h. p.r.n.   HOSPITAL COURSE:  The patient is a 50 year old woman with a known history of  sarcoid and CHF who had been admitted on Aug 13, 2005 for rule out MI and  sarcoid flare.  She was discharged home in stable condition on Aug 16, 2005.  She returned to the [facility] ED on Monday 05/14 with chest pain and a  completely regular workup; and was sent home with a steroid burst for was  thought to be a sarcoid flare.  The patient was then seen the [facility] for abdominal pain on 08/23/2005 and had an abdominal  ultrasound which showed a stable right ovarian cyst.  The patient then  presented fourth hospital [facility] on 08/24/2005 with abdominal pain with  no organic findings on CT and after being discharged from [facility],  came to the [facility] ED where she had a repeat CT, again, without  findings.  The patient had been scheduled to see Dr. [name] on 08/26/2005;  however, he felt that her pain was so severe that she would not be able to  stay at home and make her outpatient  appointment.   On review of systems the patient states she has left sharp, intermittent  abdominal pain secondary to her ovarian cyst; however, it was explained to  the patient that her cyst was on the right side.  However, she states that  the pain is left-sided and radiates to her vagina.   On physical exam her vital signs were stable with a temperature of 97, a  pulse of 74, respirations of 20, BP of 151/93, and saturating 96% on 4  liters.  On physical exam, the patient was awake and alert in no acute  distress with a labile affect.  Her lungs were clear to auscultation.  Her  heart was regular rate and rhythm.  Her abdomen was soft,  nondistended with  positive bowel sounds.  She was tender to palpation in the left lower  quadrant with voluntary guarding.   She was admitted to the family practice teaching service after an attempted  discharge by the emergency room physician which the patient refused;  becoming tearful and angry, and stating that she was in much pain to be  discharged home.  Due to this, she was admitted for pain control and a  possible GYN consult.   HOSPITAL COURSE BY PROBLEM LIST:  Problem #1:  ABDOMINAL PAIN.  The patient  had two abdominal CTs and transvaginal ultrasound, all of which showed a  stable right ovarian cyst.  No other pelvic or abdominal etiology for pain.  She was started on IV pain medication; and her pain was apparently  controlled; and she spent her day talking on the phone, and resting  comfortably unless a medical professional came in and questioned her pain;  and then she became quite theatrical in stating her pain was intolerable.  She did have a Gyn consultation with Dr. [name], her gynecologist, who  found nothing on physical exam; and stated she could follow up with him in  an outpatient setting.  As there was no organic cause for her pain, the  patient was informed that she would be discharged home on p.o. pain  medication (MS Contin  15 mg b.i.d. for basal control with MSIR 15 mg q.4-6  h. p.r.n. breakthrough pain).  The patient was quite upset at this news; and  stated she would not be leaving the hospital because we had not fixed her  pain and she was informed that yes, in fact she would be leaving the  hospital and would need to follow up with her primary care physician, in the  clinic, along with Dr. [name]; and possibly outpatient psychiatry and pain  clinic.  She stated that she would, in fact, do that; and she would also  seek out a second opinion from gynecologic doctors at [facility].  We  told the patient that that was well within her right to do so, but she could  not have a second in house consult due to the fact that she was medically  stable.   The patient was discharged home on a home pain regimen; and scheduled to  followup with her primary care physician, Dr. Osborne Oman. Coll at [place] as an outpatient.   Problem #2:  ANXIETY.  As the patient continued to complain of intractable  abdominal pain without organic cause, there was some concern as to whether  this was psychiatric related, or some secondary gain involved in her pain.  Due to their concerns regarding anxiety, depression, and/or somatoform  disorder psyche was consulted to see the patient.  It was psyche's  recommendation to increase her Paxil to 50 mg daily; and to followup with  outpatient psychiatrist to get a better handle on what was going on with  this patient.  The patient did agree to outpatient mental health follow up  and has an appointment scheduled on June 4 at 9 a.m.  She was discharged  home on the increased dose of Paxil which is now 50 mg daily.   Problem #3:  SARCOIDOSIS.  The patient has a known history of stage 4  sarcoidosis; and was maintained on her home oxygen while here at [facility].  She had no complications, no flares, and was discharged  home  without  incident.   Problem #4:  CHF.  The patient was admitted with a known history of CHF.  She was continued here on her home medications and this problem remained  stable throughout her hospitalization.  She was discharged home on her home  regimen with no changes made.   Problem #5:  HYPERTENSION.  The patient's initial blood pressures were  elevated secondary to what was likely anxiety and possibly pain.  No changes  were made to her home medication regimen.  Her blood pressures were  monitored and they normalized; and her pain was brought under control as  well as her anxiety.  She was discharged home on her home medication regimen  and will follow up with her primary care physician in the outpatient  setting.   LABORATORY DATA FROM THIS HOSPITALIZATION:  The last labs drawn were on  08/25/2005 and are as follows: Sodium 136, potassium 3.5, chloride 100,  bicarb 30, glucose 73, BUN 14, creatinine 1, calcium 8.7, total protein 6.4,  albumin 3.1, AST 26, ALT 80, alkaline phosphatase 46, total bilirubin 1.5.   ITEMS TO FOLLOWUP AT DISCHARGE:  1.  The patient has a follow up appoint with her primary care physician, Dr.      Osborne Oman. [facility] on 09/03/2005 at 9:45 a.m.  2.  The patient has a follow up appointment with a doctor at [facility] in the outpatient clinic on 09/09/2005 at 9:00 a.m.  3.  If the patient desires she follow up with Dr. [name] on an outpatient      basis.      Annye Asa, M.D.    ______________________________  Billey Chang, M.D.    KT/MEDQ  D:  08/28/2005  T:  08/29/2005  Job:  [id number]   cc:   Marine O. Coll, M.D.  [place]

## 2010-08-24 NOTE — Discharge Summary (Signed)
NAMEMARSIA, CINO NO.:  [id number]   MEDICAL RECORD NO.:  [id number]          PATIENT TYPE:  IPS   LOCATION:  0403                          [facility]ACILITY:  [facility]   PHYSICIAN:  Stark Jock, M.D. DATE OF BIRTH:  May 17, 1960   DATE OF ADMISSION:  01/01/2006  DATE OF DISCHARGE:  01/07/2006                                 DISCHARGE SUMMARY   IDENTIFYING INFORMATION:  This 50 year old married African female who was  admitted on an involuntary basis on January 01, 2006.   HISTORY OF PRESENT ILLNESS:  The patient has had a history of psychosis.  The petition paper states she is obviously delusional and responding to  internal stimuli.  She is also responding to tactile and visual stimuli.  She is suspicious and has been violent and family is afraid of her.  She  states she is unaware of reason for admission.  She stated she was cleaning  out her husband closet when she found items that were hers.  She hears the  voice of the holy spirit and thought this Probation officer was someone else after  introduction.  She adamantly denied suicidal ideation.  She was recently in  the hospital less than a week ago for psychiatric reasons.  This is the  first [facility] hospitalization.  The patient is on Paxil CR 37.5 mg p.o. q.d. and  Elavil 25 mg p.o. q.h.s.  For remainder of admission information, see  psychiatric admission assessment.   PHYSICAL EXAMINATION:  The patient's physical exam was done at the [facility] ED.  The patient has to wear oxygen constantly because she has stage IV  sarcoidosis.  She also has a history of GERD and congestive heart failure.   LABORATORY DATA:  Hepatic function panel was grossly within normal limits  except for a slightly elevated total protein of 8.85 (6-8.3) and an  increased direct bilirubin of 0.4 (0-0.3).  CBC revealed a WBC of 17.7.  CMET was within normal limits.  UDS was negative.  RPR nonreactive.  Alcohol  less than 5.   HOSPITAL COURSE:  Upon  admission, the patient was continued on her home  medications including enalapril maleate 2.5 mg p.o. b.i.d., Lasix 40 mg p.o.  b.i.d., Paxil CR 37.5 mg p.o. daily, Elavil 25 mg p.o. q.h.s., Coreg 3.125  mg p.o. q.d., Ativan 1 mg p.o. b.i.d. p.r.n. anxiety, potassium chloride 40  mEq p.o. daily, albuterol inhaler 2 puffs q.6h. p.r.n., MiraLax 17 grams  p.o. q.d. in 8 ounces of fluid, Nexium 40 mg p.o. q.d. and patient was also  placed on O2 saturation every shift.  She was placed on the O2 concentration  with the current liters that she was on prior to admission.  On January 01, 2006, the patient was started on hydrocodone 5/500 mg, 1-2 tablets p.o.  q.6h. p.r.n. pain and Haldol 2 mg IM and p.o. q.6h. p.r.n. agitation and  hallucinations, Cogentin 0.5 mg p.o. q.6h. p.r.n. EPS.  On January 02, 2006, the Paxil was discontinued.  She was started on Paxil CR 12.5 mg p.o.  daily.  On January 02, 2006, she was also started on Risperdal 0.25 mg in  the a.m. and 0.5 mg at h.s.  On January 02, 2006, later in the day,  hydrocodone was discontinued.  The patient stated she did not need a pain  medication.  On January 03, 2006, Paxil was discontinued completely.  The  patient tolerated these medications well with no significant side effects.  Upon admission, the patient was seen by her outpatient doctor, Dr. Adele Schilder.  She was pleasant but had positive derailment when speaking and was  delusional.  Her sleep was fair.  She denied auditory hallucinations and  suicidal ideation.  On January 03, 2006, the patient stated that her  defibrillator fired.  She denies any complaints.  No chest pain and  shortness of breath.  She stated she was fine.  On January 04, 2006, the  patient was fully alert, bright affect but quite unstable at times.  She  remained religiously preoccupied and spends most of her time reading her  Bible.  She has refused all Risperdal and states she does not need it.  Her   husband had complained of her threatening violence towards him which she  denies.  She complained yesterday of her defibrillator firing but evaluation  revealed this had not occurred.  Cardiology was called for help with this  situation.  They sent someone over to monitor the defibrillator and, as  indicated above, they found it had not fired.  On January 05, 2006, the  patient's insight had improved.  She was more calm and focused.  She had  very strong religious themes but this appears to be her baseline.  She  relies on her spirituality as a primary coping mechanism.  She was upset  because she is away from her daughter on the daughter's 16th birthday.  She  is still confused about the circumstances of the petition.  There was no  agitation and insight had become adequate.   On January 06, 2006, when I first met the patient, she stated it all started  when I was cleaning out my husband's closet.  She states she felt he had  things that belonged to her and was upset about this.  She became hurt and  confronted her husband about this and apparently became aggressive.  She was  then committed.  She became tearful when talking about her sister's jealousy  of her.  She was hyperverbal, hyperreligious.  She states her husband said  she needed help.  On January 07, 2006, mental status had improved markedly.  The patient had good eye contact.  She was friendly, cooperative,  appropriate.  Speech was normal rate and flow.  The psychomotor activity was  within normal limits though she is hindered by her oxygen machine.  Mood was  less depressed, irritable and anxious.  Affect wide range.  There was no  suicidal or homicidal ideation.  No self-injurious behavior.  No auditory or  visual hallucinations.  No delusions or paranoia.  Thoughts were logical and  goal-directed.  Thought content was very excited about going home. Cognitive exam was grossly within normal limits, back to baseline.    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  AXIS II:  None.  AXIS III:  Stage IV sarcoidosis, congestive heart failure, gastroesophageal  reflux disease, hypertension, ventricular tachycardia.  AXIS IV:  Moderate (problems with primary support group, medical problems).  AXIS V:  GAF upon discharge 53;  GAF upon admission 35; GAF highest past year  60-65.   ACTIVITY/DIET:  There were no specific activity level or dietary  restrictions.   DISCHARGE MEDICATIONS:  1. Vasotec 2.5 mg p.o. b.i.d.  2. Lasix 40 mg p.o. b.i.d.  3. Elavil 25 mg p.o. q.h.s.  4. Coreg 3.125 mg daily with food.  5. K-Dur 20 mEq tablets, to take 2 pills daily.  6. MiraLax 17 grams daily.  7. Nexium 40 mg daily with food.  8. Risperdal 0.5 mg, 1/2 pill in the morning and 1 pill at night (the      patient has been refusing this but will still be sent home with a      prescription).  9. Albuterol 90 mcg, 2 puffs q.6h. p.r.n. shortness of breath.   POST-HOSPITAL CARE PLANS:  The patient will return to see Dr. Toy Care on  February 11, 2006 at 4:30 p.m.      Stark Jock, M.D.  Electronically Signed     BHS/MEDQ  D:  01/07/2006  T:  01/08/2006  Job:  [id number]

## 2010-08-24 NOTE — Discharge Summary (Signed)
[name], [name] NO.:  [id number]   MEDICAL RECORD NO.:  [id number]          PATIENT TYPE:  INP   LOCATION:  6733                         FACILITY:  [facility]   PHYSICIAN:  Billey Chang, M.D.     DATE OF BIRTH:  [birth year]   DATE OF ADMISSION:  06/25/2005  DATE OF DISCHARGE:  06/28/2005                                 DISCHARGE SUMMARY   ADMISSION DIAGNOSES:  1.  Dyspnea.  2.  Hypoxia to 84% on room air which increased to 96% on 3 liters.  3.  Congestive heart failure with an ejection fraction of 55-60%.  4.  Cardiomyopathy.  5.  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  6.  Gastroesophageal reflux disease.  7.  Anxiety.  8.  History of nonsustained ventricular tachycardia and syncope status post      implantable cardioverter defibrillator placement.  9.  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  10. 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  11. History of panic attacks hospitalized in April of 2004.   DISCHARGE DIAGNOSES:  1.  Stage IV pulmonary sarcosis flare.  2.  Congestive heart failure with an ejection fraction of 55-60%.  3.  Panic attack/anxiety.  4.  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  5.  Gastroesophageal reflux disease.   PROCEDURES DURING HOSPITALIZATION:  A CT of the chest, abdomen and pelvis  which revealed normal bowel gas, stable chronic pulmonary fibrosis  consistent with known history of sarcoidosis and cardiomegaly. No acute  intra-abdominal abnormalities, abnormal lung bases (pneumonia could not be  ruled out), enlarged liver parenchyma, no acute or significant findings.  There was a small amount of free pelvic fluid.   CONSULTATIONS:  Ophthalmology consult on June 25, 2005 which revealed no  ocular involvement or sarcoid at this time. This consultation was by Dr.  Kendall Flack who is with [facility].   HOSPITAL COURSE:  Rachel [name]  is a 46-year woman who presented with a complaint  of nausea, vomiting and chest pain of one-week duration, also complaining of  left upper quadrant pain in her abdomen and was complaining of blurred  vision that developed one day prior to admission. She complained of PND,  orthopnea and states that she was now sleeping with four pillows and usually  sleeps with two. She denied any history of fever but did complain of a  green, rusty-colored phlegm for about a week and shortness of breath. At  baseline, she is on 2 to 2-1/2 liter of home O2 and states that over the  last few weeks she has increased her home O2 to about 3 liters. She denied  any sick contacts.   For dyspnea and hypoxemia, on initial presentation, the patient's O2 sats  were 84% on room air which quickly increased to 96% when given 3 liters of  O2. The patient was placed on a telemetry bed given her extensive cardiac  history of CHF as well as nonsustained V-tach after her ICD was placed. She  had  no noted arrhythmias while on telemetry bed and her initial chest x-ray  showed chronic pulmonary fibrosis but no active infiltrate. The patient was  afebrile and the sputum culture was obtained which has been no growth to  date. This will need to be followed up by her primary care physician. This  acute hypoxic episode was thought to be a sarcoidosis flare; therefore, the  patient was started on prednisone 40 milligrams p.o. and will complete a  total of a 12-day steroid taper as described in the discharge medications.  She was also placed on Avelox 400 milligrams p.o. daily, to complete a total  of a 5-day course. Blood cultures have been no growth to date and prior to  discharge the patient was saturating greater than 97% on her home dose of  oxygen 2 liters nasal cannula. Also in the differential for hypoxia was CHF  exacerbation. There was no edema noted on initial chest x-ray and the  patient BNP upon admission was 694. The  patient had been seen in the  emergency department a few days prior and had a D-dimer of 1.50; however, a  CT of the chest, abdomen and pelvis did not show any pulmonary embolism just  changes consistent with chronic pulmonary fibrosis. However, we did rule the  patient out for acute myocardial infarction. Her cardiac enzymes were  negative x2. Her initial EKG was consistent with a normal sinus rhythm with  a primary degree AV block with a PR interval of 246. This was stable  compared with the previous EKG and given the patient's history of being on  beta-blocker this was a compatible EKG.   For CHF, with an EF of 55-60%, we continued the patient on her home regimen  of Lopressor 25 milligrams p.o. b.i.d., enalapril 5 milligrams daily as well  as Lasix 20 milligrams daily. The patient did diurese about 600 cc on the  day of discharge with her home dose of Lasix and her potassium was stable at  3.8. The patient also continued on K-Dur 20 mEq p.o. daily.   For anxiety, the patient was stable throughout this hospital course but did  require constant reassurance. We continued her on her home dose of Paxil XR  37.5. Of note, the patient stated that sporadically took alprazolam 1 tablet  p.o. b.i.d. on an outpatient basis. We will defer to the primary care  physician to work out these details and to see if a scheduled dose of  alprazolam will be more beneficial for the patient.   For GERD, these symptoms were stable throughout this hospitalization as  well. The patient was to continue on Protonix 40 milligrams daily. She was  receiving Lovenox subcutaneously daily.   For abdominal pain, nausea and vomiting, the patient did receive a CT of her  chest, abdomen and pelvis and the results are listed below. This was thought  to be likely a viral gastroenteritis giving that her chemistry panel on  admission was completely normal with a T-bili of 1.2, alk phos of 90, AST of 39, ALT of 30, albumin  of 3.2. Sodium of 138 and a potassium of 3.6. Lipase  was also obtained which was 21 which was within normal limits. A urinalysis  was also obtained to rule out urinary tract infection as the precipitant  which was negative nitrite, negative leukocyte and microscopic revealed o to  2 white blood cells, 0 to 2 red blood cells with few bacteria.   Prior to discharge, the  patient had multiple musculoskeletal complaints  including that of epigastric-type pain due to persistent coughing. The  patient was encouraged to continue to bring up sputum such that these  results could be sent to the lab and cultures are pending at this time.  However, she required multiple attempts to assure her that this pain was  likely benign given that we ruled her out for cardiac enzyme and there were  no abnormalities on telemetry. She did receive Tylox, Tylenol No. 3 1 tablet  p.o. q.6h. schedule which seemed to help with her coughing pain. Physical  therapy and occupational therapy did see the patient prior to discharge  since she was complaining of generalized fatigue and weakness. This was  likely thought to be to her viral gastroenteritis. There were no  neurological or motor deficits noted on exam. We will follow up with their  recommendation to see if the patient qualifies for any home health  assistance.   DISCHARGE MEDICATIONS:  1.  Enalapril 5 milligrams daily.  2.  Lasix 20 milligrams daily.  3.  Paxil XR 37.5 milligrams daily.  4.  Nasonex 50 mcg 2 sprays per nostril daily.  5.  Albuterol 2 puffs every 4-6 hours as needed for wheeze.  6.  K-Dur 20 mEq by mouth daily.  7.  Avelox 400 milligrams by mouth daily until June 30, 2005.  8.  Prednisone 30 milligrams by mouth from March 23 to June 30, 2005, then      20 milligrams from March 26 to July 03, 2005, then 10 milligrams from      March 29 to July 06, 2005 and then off. This will be a total of a 14-      day taper for the patient considering  that she did receive prednisone 40      milligrams by mouth 3 days while she was in the hospital and then began      30 milligrams taper outpatient.  9.  Metoprolol 25 milligrams p.o. b.i.d.  10. Protonix 40 milligrams daily.  11. Phenergan 12.5 milligrams by mouth every 8 hours as needed for nausea or      vomiting.  12. Tylenol No. 3 1 tablet by mouth every 6 hours as needed for pain.   She was instructed to return to the emergency department or clinic for  fever, difficulty breathing, chest pain, lower extremity swelling or any  other concerns. Primary care physician will need to follow up with sputum  culture. She is to follow up with Dr. Milinda Antis at [facility] and given the number [phone]. Follow up on July 03, 2005 at 8:30  a.m.      Richardine Service, M.D.    ______________________________  Billey Chang, M.D.    MR/MEDQ  D:  06/27/2005  T:  06/28/2005  Job:  [id number]

## 2010-08-24 NOTE — Discharge Summary (Signed)
[name], [name] NO.:  [id number]   MEDICAL RECORD NO.:  [id number]          PATIENT TYPE:  INP   LOCATION:  2040                         FACILITY:  [facility]   PHYSICIAN:  Blane Ohara McDiarmid, M.D.DATE OF BIRTH:  Dec 28, 1960   DATE OF ADMISSION:  12/30/2004  DATE OF DISCHARGE:  01/02/2005                                 DISCHARGE SUMMARY   DISCHARGE DIAGNOSES:  1.  Congestive heart failure exacerbation.  2.  History of sarcoidosis.  3.  Anxiety with panic disorder.  4.  History of cardiomyopathy and congestive heart failure with an ejection      fraction of less than 50%.  5.  Ventricular tachycardia, nonsustained, with syncope, status post      automatic implantable cardioverter-defibrillator placement.  6.  History of steroid psychosis.  7.  Lactose intolerance.  8.  Gastroesophageal reflux disease.  9.  Allergic rhinitis.   DISCHARGE MEDICATIONS:  1.  Enalapril 5 mg p.o. daily.  2.  Toprol XL 25 mg p.o. daily.  3.  Lasix 20 mg p.o. b.i.d.  4.  Flonase two puffs per nostril daily.  5.  Albuterol two puffs p.r.n. for shortness of breath q.4h.  6.  Vitamin D supplement t.i.d. with meals.  7.  Lorazepam 1 mg p.o. b.i.d. p.r.n. anxiety.  8.  Paxil 20 mg daily.  9.  Percocet 5/325 mg one to two tablets p.o. q.4h. p.r.n. pain.  10. Protonix 40 mg p.o. daily.   DISCHARGE INSTRUCTIONS:  The patient was advised not to take Lasix or Toprol  XL until seeing her PCP, which a follow-up has been scheduled on Wednesday,  January 09, 2005, at 10:15.  The patient told that she may take her enalapril  5 mg during this week but this is the only blood pressure medication she  should take until seeing her primary care physician.  Patient also advised  to use home oxygen with exertion and when feeling short of breath.   CONSULTATIONS:  No consultations.   HISTORY AND PHYSICAL:  Please see dictated H&P.   HOSPITAL COURSE:  [name] is a 50 year old patient of Dr.  Milinda Antis at  [facility] with a history of sarcoidosis, CHF and  cardiomyopathy, who presented to the ED with a week plus history of chest  pain, shortness of breath, headache and numbness in her left hand and leg.  The patient also complained of shortness of breath which had been present  consistently for the entire week prior to hospitalization.  The patient  denied nausea, vomiting, diaphoresis.  On admission, the patient received a  chest x-ray which revealed advanced pulmonary fibrosis as well as bibasilar  edema, which could support CHF exacerbation.  The patient's BNP was also  elevated at admission to 289.  Cardiac enzymes drawn eight hours apart x3  were negative.  The following problems were addressed during the patient's  admission:   Problem 1.  SHORTNESS OF BREATH SECONDARY TO CONGESTIVE HEART FAILURE  EXACERBATION:  Over hospitalization the patient continued to complain of  shortness of breath and on ambulation without oxygen  desaturated to the mid-  80s, lowest recorded desaturation down to 85% with ambulation.  The patient  on 2 L of oxygen saturating in the high 90s throughout hospitalization.  The  patient's oxygen weaned.  Patient given a treatment of albuterol and low-  dose Advair.  Her O2 saturation improved after treatment to a resting O2  saturation of 95% without oxygen after treatment.  Throughout  hospitalization patient encouraged to use spirometer and ambulate every four  hours.  Lasix 40 mg IV b.i.d. started for diuresis.  After Lasix  administration, patient clinically improved, no crackles heard at lung  bases.  Blood pressure did drop on January 01, 2005, to a blood pressure  of 90/50.  Lasix was held overnight and during the day of January 02, 2005.  The patient advised to not take Lasix nor her Toprol until seeing her  primary care physician next Wednesday secondary to low blood pressures which  remained in the 90s/60s.   Problem  2.  CONSTIPATION:  The patient complained of constipation in the  hospital.  Patient given milk of magnesia and prunes for snacks.  Prunes and  milk of magnesia alleviated constipation.   Problem 3.  FATIGUE:  Patient complaining of fatigue with exertion  throughout hospitalization.  I explained to the patient this could be  secondary to deconditioning as well as CHF exacerbation.  Advised to  continue to ambulate q.4h. in the hospital.  H&H stable, no signs of anemia.   Problem 4.  BLOOD PRESSURE:  Over September 26 to 27, 2006, blood pressures  ranged from 90s/50s to 90s/60s.  Last recorded blood pressure was in the  90s/60s.  Lasix was held.  On discharge instructions, the patient advised  not to take her Lasix and not to take her Toprol XL until seeing her primary  care physician Wednesday, October 4.   Problem 4.  MEDICATION LIST:  During hospitalization, pharmacist visited  patient to review medication list and to offer suggestions for a cheaper  regimen.  During her hospitalization, her Astelin nasal spray was changed to  Flonase, her Coreg was changed to Toprol XL 25 mg daily, and her Altace was  changed to enalapril 5 mg p.o. daily.   Problem 5.  HISTORY OF ANXIETY AND PANIC DISORDER:  The patient was  continued on her Paxil and p.r.n. lorazepam.   Problem 6.  GASTROINTESTINAL PROPHYLAXIS:  The patient was continued on her  Protonix.   DISCHARGE CONDITION:  Good.   DISPOSITION:  Discharged to home to self.   Follow-up appointment scheduled with family practice center, Dr. Milinda Antis, on January 09, 2005, at 10:15.      Briscoe Deutscher Smith Mince, M.D.    ______________________________  Blane Ohara McDiarmid, M.D.   MBV/MEDQ  D:  01/02/2005  T:  01/03/2005  Job:  [id number]

## 2010-08-24 NOTE — H&P (Signed)
Rachel [name], Rachel [name] NO.:  [id number]   MEDICAL RECORD NO.:  [id number]          PATIENT TYPE:  INP   LOCATION:  4702                         FACILITY:  [facility]   PHYSICIAN:  Verner Chol, MD DATE OF BIRTH:  1960-07-21   DATE OF ADMISSION:  01/21/2005  DATE OF DISCHARGE:                                HISTORY & PHYSICAL   CHIEF COMPLAINT:  Hemoptysis.   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  She  also complains of night sweats x1 month, but denies weight loss.  Her chest  pain is described as constant, somewhat better in certain positions and  feels like a soreness and a drumming on her chest.  The patient does wear O2  at home and she also notes that her Lasix was recently stopped by her  primary care physician due to low blood pressures.  The patient denies NSAID  and aspirin use.  She denies sick contacts and recent travels.  She does not  have any rashes.  The patient was last seen by her pulmonologist  approximately one year ago.  She has intermittently been on steroids for her  sarcoidosis, but has not been on any in over a year.  The only medication  she takes for it is albuterol.  She does wear oxygen at home usually about 3  L.   REVIEW OF SYSTEMS:  Negative for weight loss, hematochezia, melena, dysuria,  and rashes.   PAST MEDICAL HISTORY:  1.  Stage IV sarcoidosis with ocular involvement.  2.  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  3.  History of nonsustained ventricular tachycardia and syncope status post      AICD implantation.  4.  History of anxiety and depression.  5.  Gastroesophageal reflux disease.  6.  History of steroid  psychosis.  7.  History of panic attacks requiring hospitalization in 2004.   MEDICATIONS:  1.  Albuterol two puffs every four hours p.r.n.  2.  Enalapril 5 mg p.o. daily.  3.  Flonase one spray each nostril b.i.d.  4.  Calcium carbonate plus vitamin D t.i.d. with meals.  5.  Lorazepam 1 mg p.o. b.i.d. p.r.n. anxiety.  6.  Paxil 20 mg p.o. daily.  7.  Percocet 5/325 mg one to two tablets every four hours as needed.  8.  Toprol XL 25 mg p.o. daily.  9.  Protonix 40 mg p.o. daily.   ALLERGIES:  STEROIDS, she has a history of psychosis at 80 mg of PREDNISONE;  DARVOCET causes a pruritic rash.   PAST SURGICAL HISTORY:  Bilateral tubal ligation.   FAMILY HISTORY:  She has a mother who is 52 and living who has diabetes,  CHF, and hypertension.  Her father is deceased at age 80 from lung cancer.  She has a sister with lupus  and hypertension.   SOCIAL HISTORY:  She is married.  Lives with her daughter who is 17.  She  also has a son who is 43.  She is an Agricultural engineer at [employer].  She  has a remote history of tobacco use, approximately 20-pack-year history and  has quit in 1994.  She has a remote history of alcohol use and she quit in  1994.   PHYSICAL EXAMINATION:  VITAL SIGNS:  Temperature 98.8, blood pressure  151/94, pulse 82, O2 saturations 80% on room air and 87% on 4 L nasal  cannula.  GENERAL:  This is an alert and oriented, anxious female.  HEENT:  Head is normocephalic, atraumatic.  Pupils are equal, round, and  reactive to light and accommodation.  Extraocular movements intact.  She has  dried blood in her nares.  Mouth:  OP is without erythema, ulcers, or  exudate.  LUNGS:  Patient does have diffuse chest wall tenderness as well as diffuse  scattered crackles bilaterally.  HEART:  Regular rate and rhythm.  No murmurs, rubs, or gallops.  ABDOMEN:  Soft, normoactive bowel sounds, nontender, nondistended.  EXTREMITIES:  Sore to palpation.  She does not have any  palpable cords.  Homan's negative.  Pulses are 2+ and strong in extremities.  She does not  have any pitting edema.  NEUROLOGIC:  Cranial nerves II-XII are grossly intact.  Her DTRs are 1+ and  equal throughout.  Balance and gait were not assessed.  SKIN:  No rashes, bruises, or lesions.  LYMPH:  She does not have any cervical or axillary adenopathy.   LABORATORY AND TESTS:  Point of care enzymes negative.  PT 14.2, PTT 31, INR  1.1, lipase 21.  Sodium 137, potassium 3.6, chloride 105, bicarbonate 25,  BUN 15, creatinine 0.9, glucose 78, albumin 3.4, total protein 7.4, calcium  9.1, AST 23, ALT 22.  White blood count 7, hemoglobin 10.9, hematocrit 33,  platelets 303, MCV 84.5.  Her chest x-ray shows diffuse pulmonary fibrosis  consistent with end-stage sarcoid and borderline cardiomegaly and hilar  adenopathy.  EKG:  Normal sinus rhythm, normal intervals.  No ST or T  changes.   ASSESSMENT/PLAN:  1.  Hemoptysis/shortness of breath/chest pain.  Our differential in her      includes a severe sarcoidosis exacerbation, pneumonia, pulmonary      embolism, congestive heart failure exacerbation, and tuberculosis.  Her      chest x-ray, however, is most consistent with end-stage sarcoid as      opposed to pneumonia or congestive heart failure exacerbation.  We will      start her on steroids at a low dose secondary to her history of steroid      psychosis.  We will also give her a little bit of Lasix to gently      diurese her.  We will check a chest CT to rule out a pulmonary embolism.      We will give her O2 to keep her saturations greater than 90% and given      her chest pain and shortness of breath we will get three sets of cardiac      enzymes and rule her out for an myocardial infarction.  We will also      place a PPD on her to evaluate for tuberculosis.  We will give her      morphine as needed for pain.  2.  Congestive heart failure.  We will monitor her volume status.  We will      continue enalapril and Toprol as tolerated.  We will get a BNP.  3.  History of anxiety/depression.  We will continue her on lorazepam p.r.n.      and Paxil.  4.  Prophylaxis.  We will have her on Protonix for gastrointestinal      prophylaxis and SBDs for gastrointestinal prophylaxis.      Kaylyn Layer, M.D.    ______________________________  Verner Chol, MD    KS/MEDQ  D:  01/22/2005  T:  01/22/2005  Job:  [id number]

## 2010-08-24 NOTE — Consult Note (Signed)
NAME:  FRANCETTA, ILG NO.:  [id number]   MEDICAL RECORD NO.:  [id number]          PATIENT TYPE:  IPS   LOCATION:  0404                          FACILITY:  [facility]   PHYSICIAN:  Shaune Pascal. Champey, M.D.DATE OF BIRTH:  Apr 28, 1960   DATE OF CONSULTATION:  02/17/2006  DATE OF DISCHARGE:                                 CONSULTATION   NEUROLOGY CONSULTATION   REASON FOR CONSULTATION:  Psychosis.   HISTORY OF PRESENT ILLNESS:  [name] is a 50 year old, African-American  female with a past medical history of psychosis and panic attacks and  multiple other medical problems, initially presented with increased  psychosis and unusual behavior as a daughter found her on her hands and  knees barking and acting like a dog.  The patient became very agitated,  aggressive, and combative as well.  The patient states she has also been  having visual hallucinations, seeing rats, etc.  She has no recollection  of the unusual behavior.  She states that she occasionally gets a  headache most days when she wakes up, which is mostly bifrontal and  pressured in location, and states the pain runs away and hides.  She  states that she feels unsteady when she is nervous and scared.  She  denies any focal weakness, no vision changes, speech changes, falls, or  loss of consciousness.   PAST MEDICAL HISTORY:  1. Sarcoidosis.  2. Anxiety.  3. Psychosis.  4. Congestive heart failure.  5. Reflux.  6. Allergic rhinitis.  7. Hypertension.  8. Somatization disorder.  9. Defibrillator placement.   CURRENT MEDICATIONS:  Os-Cal, Carbetalol, Enalapril, Lasix, Nasonex,  Risperdal, Lovenox.   ALLERGIES:  1. STEROIDS, which cause psychosis.  2. DARVOCET, which causes a rash.   FAMILY HISTORY:  Positive for bipolar and psychiatric disorders in her  mother.   SOCIAL HISTORY:  Patient lives with her husband and daughter, has a  history of alcohol and tobacco use.   REVIEW OF SYSTEMS:  Difficult  to assess at this time secondary to  patient's mental status.   PHYSICAL EXAMINATION:  VITAL SIGNS:  Temperature is 98.0, respirations  12, blood pressure is 84/64, O2 SAT is 91%.  HEENT:  Normocephalic, atraumatic.  Extraocular muscles are intact.  Pupils equal, round, and reactive to light.  NECK:  Supple, no carotid bruits.  HEART:  Regular.  LUNGS:  Clear.  ABDOMEN:  Soft and nontender.  EXTREMITIES:  No edema with good pulses.  NEUROLOGICAL EXAMINATION:  Patient is awake, alert, and following  commands appropriately.  Language is fluent.  Patient knows her name and  age, states the place is delivery, patient does not know the year.  Cranial nerves II-XII are grossly intact.  Motor examination is 4+ out  of 5 strength and normal tone in all four extremities.  No drift is  noted.  Sensory examination is within normal limits.  Light touch and  reflexes are 1 to 2+ throughout.  Toes are neutral bilaterally.  Cerebellar function is within normal limits with finger-to-nose.  Gait  is wide based yet steady.   CT  of the head shows a 6 x 12 mm soft tissue extra-axial mass on the  right frontoparietal cortex; otherwise, unremarkable.  Urinalysis shows  amber, large blood, trace leukocyte-esterase, and RBCs 11 to 20.  WBC is  7.8, hemoglobin 11.9, hematocrit is 35.3, platelets are 310.  Sodium is  139, potassium is 4.1, chloride is 104, CO2 is 26, BUN is 12, creatinine  is 0.9, glucose 93, calcium is 9.6.   IMPRESSION:  This is a 50 year old with increased psychosis with a  questionable urinary tract infection.  History of psychosis and a  questionable infection, now worsening of psychosis.  Will consider  repeating urinalysis and getting a urine culture for further evaluation.  Her CT scan was reviewed.  The patient could not get an MRI secondary to  defibrillator placement.  I would recommend getting an EEG at this time  to rule out any seizure activity that could be causing  increased  psychosis and combative behavior.  I would also recommend consider  repeating CT of the head as the patient cannot get an MRI scan.  Will  also check B12, B1, TSH, RPR, ESR, and folate to rule out other  etiologies for her altered mental status.  I would recommend continuing  the patient's current plan of treatment.  We will follow the patient  while she is in the hospital.      Shaune Pascal. Estella Husk, M.D.  Electronically Signed     DRC/MEDQ  D:  02/17/2006  T:  02/17/2006  Job:  [id number]

## 2010-08-24 NOTE — Consult Note (Signed)
Rachel [name], Rachel [name] NO.:  [id number]   MEDICAL RECORD NO.:  [id number]          PATIENT TYPE:  INP   LOCATION:  0865                         FACILITY:  [facility]   PHYSICIAN:  Felizardo Hoffmann, M.D.  DATE OF BIRTH:  [birth year]   DATE OF CONSULTATION:  12/27/2005  DATE OF DISCHARGE:  12/29/2005                                   CONSULTATION   REASON FOR CONSULTATION:  Psychosis.   Ms. Kosar is a 50 year old female, admitted to the [facility] system  on the 20th of September after her home health nurse discovered her with  disorganized thought process and not caring for herself.   The patient has had recent symptoms of anxiety and somatic symptoms that  have not been explained by organic etiology.  The patient has been, most  recently, refusing treatment.  She has been delusional and confused.  She  has been reverting to sign language and has been having auditory  hallucinations.  She has refrained from speaking due to fear that someone  will hear her.  She is afraid that someone will harm her.  She is not  verbalizing any suicidal ideation.  She does have ideas of reference.   PAST PSYCHIATRIC HISTORY:  There is no prior history of hallucinations or  delusions.  There is a history of somatic symptoms not explained by organic  etiology.  Her previous psychotropic trials include Elavil, Paxil and  Ativan.   FAMILY PSYCHIATRIC HISTORY:  None known.   SOCIAL HISTORY:  The patient is married.  She has 2 grown children.  Siblings 7.  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  She has not used alcohol since 1994.  She does  not use any illegal drugs.   GENERAL MEDICAL PROBLEMS:  1. Sarcoidosis.  2. Hypertension.  3. Syncope.  4. Gastroesophageal reflux disease.  5. The patient has a pacemaker defibrillator.  6. She also has a history of congestive heart failure.   ALLERGIES:  HER ALLERGIES INCLUDE:  1.  DARVOCET.  2. PREDNISONE.  3. LORTAB.   MEDICATIONS:  The MAR is reviewed.  Psychotropics include:  1. Elavil 50 mg q.h.s.  2. Haldol 5 mg b.i.d.  3. Ativan 2 mg q.4 hours p.r.n.   LABORATORY DATA:  CBC is within normal limits.  The patient's urine drug  screen was positive for benzodiazepines.  Her blood alcohol was negative.  Her urinalysis indicates a urinary tract infection.  Her glucose was 104.   REVIEW OF SYSTEMS:  CONSTITUTIONAL:  Afebrile.  HEAD:  No trauma.  EYES:  No  visual changes.  EARS:  No hearing impairment.  NOSE:  No rhinorrhea.  MOUTH/THROAT:  No sore throat.  NEUROLOGIC:  Unremarkable.  PSYCHIATRIC:  As  above.  CARDIOVASCULAR:  No chest pain.  RESPIRATORY:  No coughing.  GASTROINTESTINAL:  The patient has been clutching her abdomen.  GENITOURINARY:  No dysuria.  SKIN:  Unremarkable.  MUSCULOSKELETAL:  No  deformities.  ENDOCRINE/METABOLIC:  Unremarkable.  HEMATOLOGIC/LYMPHATICS:  Unremarkable.   EXAMINATION:  VITAL SIGNS:  Afebrile.  Vital signs stable.   MENTAL STATUS EXAM:  Ms. Chea has a very anxious affect.  She is alert.  She  is clutching her abdomen and moaning.  Her judgment is impaired.  Her  insight is poor.  She is having ideas of reference with the T.V. before the  T.V. is turned off.  She is behaving as if she is in labor.  Thought  content:  No thoughts of harming herself.  Delusions as described in the  history of present illness.  She makes a gesture as if somebody is going to  cut her throat.  Her fun of knowledge and intelligence appear to be  decreased below that of her premorbid baseline.  Her mood is anxious.  Her  speech is pressured.  Psychomotor tone is agitated.  Throughout process  involves some disorganization.  Concentration is decreased.  Memory is  grossly within normal limits.  She is grossly oriented to all spheres.   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   RECOMMENDATIONS:  1. Decrease  Elavil to 25 mg q.h.s. due to a possibility of the      anticholinergic side effects contributing to her mental status      abnormalities.  2. Would utilize Haldol 5 mg p.o. IM or slow push IV q.4 hours p.r.n. with      Ativan 1 to 2 mg p.o. IM or IV q.4 hours p.r.n. for severe agitation.  3. Would also start standing Haldol at 1 mg p.o. IM or slow push IV t.i.d.      for antipsychosis.  4. If organically and medically cleared, would then admit to a psychiatric      unit for stabilization of her psychosis.  5. Contact the husband for collateral information regarding the patient's      psychotic history.  6. Would pursue a reversible CNS etiology workup due to the age of this      patient and no prior evaluations providing a history of a psychotic      episode of this nature.  Would include, at this point, TSH, B12,      folate, RPR and a cranial image, as well as ammonia level.      Felizardo Hoffmann, M.D.  Electronically Signed     JW/MEDQ  D:  01/16/2006  T:  01/16/2006  Job:  [id number]

## 2010-08-24 NOTE — Discharge Summary (Signed)
NAME:  Rachel [name], Rachel [name] NO.:  [id number]   MEDICAL RECORD NO.:  [id number]          PATIENT TYPE:  INP   LOCATION:  3031                         FACILITY:  [facility]   PHYSICIAN:  Elveria Rising. Damita Dunnings, M.D. DATE OF BIRTH:  [birth year]   DATE OF ADMISSION:  02/15/2006  DATE OF DISCHARGE:  02/16/2006                                 DISCHARGE SUMMARY   DISCHARGE DIAGNOSES:  1. Hypokalemia.  2. Psychosis.  3. Sarcoid.  4. Congestive heart failure.  5. Hypertension.  6. Allergic rhinitis.   DISCHARGE MEDICATIONS:  1. Calcium carbonate with vitamin D, Os-Cal p.o. t.i.d. with meals.  2. Carvedilol 6.25 mg p.o. b.i.d.  3. Enalapril 2.5 mg p.o. q. day.  4. Lasix 40 mg p.o. b.i.d.  5. Nasonex 2 sprays in each nostril per day.  6. Nexium 40 mg p.o. q. day.  7. Risperdal 0.25 mg p.o. q. day.  8. Lovenox 40 mg sub cu q. day.  9. Lovenox 40 mg p.o. q. day.  10.Haldol 2 mg p.o. q.1-2 hours p.r.n. severe agitation.  11.Tylenol 650 mg p.o. q.6-8 h. p.r.n. headache.  12.Albuterol 2 puffs q.4 h. p.r.n.  13.Ativan 1 mg p.o. b.i.d. p.r.n.   DISCHARGE FOLLOWUP:  The patient will need to have a followup.  One has been  scheduled with Dr. Milinda Antis after she had discharged from [facility].  She is to be transferred to [facility] today, as she is now  medically stable and having continuing psychiatric symptoms.   PROCEDURE/DIAGNOSTICS STUDIES:  None.   CONSULTANTS:  None.   ADMISSION HISTORY AND PHYSICAL:  The patient is a 50 year old African-  American female with a history of psychiatric disease who was found on all  fours the morning before admission, barking like a dog.  This was reported  by the patient's daughter.  She was brought to [facility] via EMS.  Initially, the plan was to transfer her to [facility], but Behavioral  Health could not accept, because her potassium was low.  She was admitted  for medical management with subsequent transfer to  [facility]  delayed.   HOSPITAL COURSE:  Problem:  1. Hypokalemia.  The the patient received in total 140 mEq of potassium to      replete her deficit.  Her potassium corrected without complication from      her initial value of 2.9 to 4.1.  She is now being continued on 40 mEq      of potassium q. day, as per her home routine.  It would be reasonable      to check her potassium in approximately 1 week and adjust her      medications as needed, by either increasing or decreasing her potassium      supplementation.  As far as the etiology, it is unclear if the patient      was non-compliant with the potassium or overusing her Lasix.      Immediately then, she is now stable for transfer.  2. Psychosis.  The patient has a history of psychosis.  She is treated  with an atypical antipsychotic.  Her medications have been adjusted      during a recent admission to [facility].  Please see previous      dictation of Dr. Randye Lobo on January 07, 2006.  Discontinue on her      home dose of Risperdal, and she was given several doses of Haldol IM      when she was excessively agitated.  Note, that during this      hospitalization, she had apparently had visual and auditory      hallucinations.  She has had nonsensical speech that alternates with      fluent speech.  She has been exceedingly distrustful with staff of      actions not based in reality.  She was placed on suicidal precautions,      until she was sent to [facility].  She did not have any suicidal      or homicidal ideations at this time.  She is safe for transport.      Discussed this with my attending.  It is felt that [facility] is      the most appropriate location for her right now.  3. Sarcoid.  The patient has a history of sarcoidosis.  At baseline, she      has an occasional modest oxygen requirement.  This morning, she was 93%      on room air.  She did have episodes of where she had a modest       requirement of O2.  Her SAT was improved with 2 liters of nasal      cannula.  This is similar to previous.  Her lung fields are clear.  She      has no fevers, and there is no reason to suspect any other pathology.      Discontinue her home oxygen if needed and use albuterol.  4. Congestive heart failure.  Patient has no lower extremity edema, no      chest pain and no shortness of breath.  She is continued on a regular      home regimen, without change in hypertension.  Blood pressure well      controlled on home regimen.  5. Rhinitis.  The patient is being continued on her home inhaled      intranasal steroids.   DISCHARGE LABS:  Notable labs are as follows:  Initial white count 7.8,  hemoglobin 11.9, potassium decreased to 2.9, now corrected to 4.1.  Creatinine 0.9 to 1.1, LFTs within normal limits.  Pregnancy test negative.  Urine drug screen negative.  Alcohol negative.  Lipase 26.  UA did show  turbid fluid with positive hemoglobin and trace leukocyte esterase.  There  were 11-20 red blood cells by high-powered field.  Urine culture is pending.   ITEMS TO FOLLOW UP:  The patient did have one questionable episode of  dysuria.  Increase in her urinalysis was positive for LE but negative for  nitrate.  She is having no abdominal pain at the time of discharge.  She was  having no flank pain and no fevers.  She is not a candidate for antibiotics  right now.  Her urine was cultured.  In the event that she would have  continued dysuria, this could guide treatment.  If the urinalysis does grow  a single organism, it is recommended that this be treated if she has  continuing symptoms of dysuria.  I would recommend 3 to 5  days of treatment,  as long as she is not having back pain, fevers or other systemic findings.  Final decision is left up to the treatment physician at [facility].   This dictation is a stat discharge summary to expedite transfer to  [facility].     Elveria Rising Damita Dunnings, M.D.     GSD/MEDQ  D:  02/16/2006  T:  02/16/2006  Job:  [id number]   cc:   Randa Spike, MD

## 2010-08-24 NOTE — Procedures (Signed)
EEG NUMBER:  [id number].   CLINICAL HISTORY:  A 50 year old female with psychosis.  Study is being done  to look for the presence of seizures (780.39).   PROCEDURE:  The tracing is carried out on a 37 digital Cadwell recorder  reformatted into 16 channel montages with one devoted to EKG.  The patient  was awake and drowsy during the recording.  The [employer] 10/20 system  lead placement was used.   DESCRIPTION OF FINDINGS:  Dominant frequency is 8-9 Hz 20 microvolt activity  that is well regulated and attenuates partially with eye opening.   With drowsiness the background shows mixed frequency theta and delta range  activity but does not drift into natural sleep.  There was no focal slowing.  There was no interictal epileptiform activity in the form of spikes or sharp  waves.   EKG showed regular sinus rhythm with ventricular response of 84 beats per  minute.   IMPRESSION:  In the waking state and drowsiness this record is normal.      Princess Bruins. Gaynell Face, M.D.  Electronically Signed     YVD:PBAQ  D:  02/18/2006 21:55:03  T:  02/19/2006 08:57:38  Job #:  [id number]   cc:   Shaune Pascal. Estella Husk, M.D.  Fax: [phone]

## 2010-08-24 NOTE — Discharge Summary (Signed)
NAME:  Rachel [name], Rachel [name] NO.:  [id number]   MEDICAL RECORD NO.:  [id number]                   PATIENT TYPE:  INP   LOCATION:  2015                                 FACILITY:  [facility]   PHYSICIAN:  Leonides Schanz, M.D.                DATE OF BIRTH:  Nov 04, 1960   DATE OF ADMISSION:  08/02/2002  DATE OF DISCHARGE:  08/03/2002                                 DISCHARGE SUMMARY   PRIMARY PHYSICIAN:  Dr. Tonia Brooms, [place].   INTERN:  Dr. Laurita Quint.   DISCHARGE DIAGNOSIS:  1. Panic attack.  2. Asthma.  3. Sarcoidosis.  4. Allergic rhinitis.   DISCHARGE MEDICATIONS:  1. Include albuterol 2 puffs q.6h. p.r.n.  2. Claritin 1 pill q.d. as needed.  3. Paxil 20 mg p.o. q.d.  Patient is to start taking dosage in the morning.     If notes any drowsiness, will switch over to at night.   ACTIVITIES:  Increase as tolerated, and patient will be excused for work  through the rest of the week through 04/30.  No other special instructions  or wound care.   FOLLOW UP:  Patient will follow up with Dr. Marianna Fuss Hecox in approximately 10  to 14 days for followup.   BRIEF ADMISSION PHYSICAL:  Please see dictated history and physical in the  chart.   Briefly, this is a 50 year old patient who, all of a sudden on the day of  admission, had burning pain down her entire left side of her body, including  her head, arms, legs and feet.  This lasted for approximately about two  hours, gradually getting better.  She noted some dizziness and shakiness,  some nausea-feeling, some kind of feelings of impending doom.  No sweats.  No chest pain.  No headache at the time, though patient admits to having  some increasing diaphoresis at other times when she has had some of these  other symptoms.  Patient also notes some perioral tingling.  Patient did not  take anything and called 911 before coming to the ER.  Patient received  Ativan, Phenergan in the ED  for her nausea, and patient notes that her  symptoms were much better.  Patient also did note some palpitations.   REVIEW OF SYSTEMS:  Positive for some palpitations and night sweats, some  tingling, some diffuse abdominal pain.  It was approximately 2/10.  No other  rashes.  No other signs.  Patient does have a history of some unknown  specified psychosis, for which patient will not go into details.  Patient  does not smoke, does not drink.  No previous history of hypertension.  No  previous history of coronary artery disease or elevated lipids.   FAMILY HISTORY:  Patient only has family history of some heart disease in  her family.   PHYSICAL EXAMINATION:  NEUROLOGIC:  Patient had a normal  cranial nerve exam  and some mild weakness of her left foot flexion, although 4+/5 strength  bilaterally and equivocally throughout her upper extremity and lower  extremity exam.  She did have some tenderness to palpation along her left  upper and lower extremities.   LABORATORY DATA:  Patient had a normal __________ and CBC.  No other labs  are abnormal.  Patient had cardiac enzymes that were ruled out and no other  EKG changes except for some mild primary first-degree heart block.   HOSPITAL COURSE:  Left-sided pain and tingling.  Possibility of TIA and  stroke, although not very likely, since patient had normal neuro exam.  No  other changes.  Patient had ruled out for any MI and was placed on telemetry  with no signs of any irregular rhythms.  The patient had a normal TSH done  during her hospital course and normal fasting lipid panel.  Patient's  symptoms began to improve overnight, and in the morning, and then patient  began to notice having some more chest tightness and perioral tingling and  some mild nausea.  Patient does have a history of gastritis, so difficult to  separate out whether or not it is gastric-related or not.  Likely, the  culmination of all these symptoms fit the diagnosis  of panic disorder, at  this point in time, although patient does not notice any anxiety or  stressors other than from her home situation.  Patient reassured by the fact  that this is noncardiac in origin and told to continue to take her other  over-the-counter medications for gastritis to see if this alleviates her  symptoms as well.  Patient will be started on Paxil 20 mg a day and will  follow up with Dr. Melanee Left for further evaluation in approximately 10 to 14  days.  Patient is okay with this and given information and educational  handouts on panic disorder.  1. Hypokalemia.  Patient noted to have a potassium of 3.2 on admission.  Had     no reason for potassium loss, although patient was repleted with oral     potassium before discharge.  2. Sarcoid.  Patient sees Dr. Keturah Barre for her sarcoid diagnosis.     Patient had chest x-ray performed in the ER, which showed some increased     fibrotic changes since the year 2000.  Patient was maintaining good O2     sats, although somewhat diminished for a normal 50 year old.  Patient has     O2 sats of 94% on room air.  Recommend follow up at some point in time     with Dr. Keturah Barre.  Patient was discharged on 08/03/02 in stable     condition and will follow up as noted above.  Patient was told to call     her primary doctor if any of these symptoms worsen between now and her     followup appointment time.  Patient was given a work excuse from 04/26 to     04/30 while she recovers from this hospitalization.                                                 Leonides Schanz, M.D.    RM/MEDQ  D:  08/03/2002  T:  08/04/2002  Job:  [id number]

## 2010-08-24 NOTE — Discharge Summary (Signed)
NAME:  Rachel [name], Rachel [name] NO.:  [id number]   MEDICAL RECORD NO.:  [id number]                   PATIENT TYPE:  EMS   LOCATION:  [facility]                                 FACILITY:  [facility]   PHYSICIAN:  Kirk Ruths, M.D. [facility]            DATE OF BIRTH:  [birth year]   DATE OF ADMISSION:  06/28/2003  DATE OF DISCHARGE:  06/28/2003                                 DISCHARGE SUMMARY   PRIMARY CARE GIVER:  Blima Ledger, M.D.   PULMONOLOGIST:  Kasandra Knudsen. Annamaria Boots, M.D.   CARDIOLOGIST:  Fransico Him, M.D.   ELECTROPHYSIOLOGY SPECIALIST:  Champ Mungo. Lovena Le, M.D.   PRESENTING CIRCUMSTANCE:  I have been hurting since they put the ICD in.   HISTORY OF PRESENT ILLNESS:  [name] is a 50 year old female.  She has a  13 year history of sarcoid.  She presented in December 2004 with a one week  history of increased shortness of breath, chest pain.  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  She also had a Cardiolite study on March 17, 2003, which  was negative for ischemia, ejection fraction 22%.  Because she was also  found to have nonsustained ventricular tachycardia with a history of syncope  in the setting of dilated idiopathic cardiomyopathy and markedly low  ejection fraction of 20%, an ICD was implanted on March 28, 2003; a St.  Jude V/196, serial [id number] by Dr. Cristopher Peru, an electrophysiology  specialist.  The patient claims that since the surgery she has had left-  sided pain.  It starts at the left axilla, but it is also in the left  subclavian area.  She is unable to raise her arm quickly.  She was unable to  turn in bed without sitting up to turn because it causes a great deal of  pain.  She describes the pain in onset as like a pitchfork.  She presented  May 03, 2003.  She had an ultrasound of the left arm, which was a  negative study, and also a venogram, which was negative.   The ultrasound was  negative for DVT.  Venogram was also negative.  She has continued to have  pain with left upper extremity movement.  She claims that the ICD is  hitting a nerve.  She was seen in the Emergency Room on the evening of  June 27, 2003.  She was given Demerol and discharged.  She was scheduled to  see Dr. Lovena Le today, but missed the appointment secondary to somnolence,  per the patient.  She is now back in the Emergency Room tonight and is still  complaining of intractable pain.  She does not particularly want narcotic  analgesics, but she is seeking some resolution for this chronic problem.   PAST MEDICAL HISTORY:  1. End stage sarcoidosis x 13 years, involving the eyes and lungs.  2.  History of panic attacks.  3. Idiopathic dilated cardiomyopathy, secondary to sarcoid.  Ejection     fraction 20%.   PAST SURGICAL HISTORY:  1. Status post tubal ligation.  2. Status post implantation of ICD on March 28, 2003; St. Jude V/196.   The patient gives no prior history of GI bleed, diabetes, pulmonary  embolism, deep vein thrombosis, but perhaps borderline hypertension.  No  history of CVA.  No thyroid disease.  No hypercholesterolemia.   The patient lives in [place] with her daughter.  She is alive and well at  age 50.  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  She does not smoke.  She does not partake of alcoholic  beverages.  She is not involved with recreational drugs.   FAMILY HISTORY:  The mother is still living, has congestive heart failure.  The father died of cancer, but did have heart troubles.  One brother and one  sister, both have hypertension.   MEDICATIONS:  1. Altace, unknown dose.  2. Lanoxin, unknown dose, with a digoxin level of 0.6.  3. Lasix 20 mg daily.  4. Multivitamin daily.  5. Coreg 12.5 mg twice daily.  6. Prednisone 10 mg daily.   REVIEW OF SYSTEMS:  The patient is not having fevers, chills,  sweats, weight  change or adenopathy.  HEENT SYSTEM:  No headaches, nasal discharge,  epistaxis, voice changes, vertigo, photophobia.  INTEGUMENT:  The patient  does not complain of any skin rashes, lesions.  CARDIOPULMONARY:  The  patient complaining of chronic pain at the site of the ICD placement.  She  also complains of a lower extremity edema and complains of the fact that the  left upper extremity on the extensor side seems more swollen than the right  upper extremity.  UROGENITAL:  The patient is not complaining of frequency,  urgency, dysuria or nocturia.  She is not giving any particular history of  weakness, numbness.  She does have anxiety, but this is only exacerbated by  this chronic pain that never seems to go away.  MUSCULOSKELETAL:  No  arthralgia or joint swelling, deformity or pain.  She is not having bright  red blood per rectum.  She does not have heartburn or gastrointestinal  reflux, melena or hematemesis.  The patient is not heat or cold intolerant.  She does not have skin or hair changes.  All of the systems are negative.   PHYSICAL EXAMINATION:  Temperature 97.2, pulse is 66 and regular,  respirations 18, blood pressure 128/63, oxygen saturation 96% room air.  The  patient is alert and oriented x 3.  HEENT:  Eyes:  Pupils are equal, round and reactive to light.  Extra-ocular  movements are intact.  Mucous membranes are pink, moist, without lesion or  erythema.  The patient does not wear dentures.  The neck is supple.  No  carotid bruits auscultated.  No jugular venous distension, no cervical  lymphadenopathy.  HEART:  Regular rate and rhythm, without murmur.  LUNGS:  Clear to auscultation, except for crackles at both bases.  INTEGUMENT:  No rashes or lesions.  BREAST EXAM:  Deferred.  At the left axillary region, there is tenderness at  the lateral border of the ICD pocket, also at the superior border of the ICD pocket.  This tenderness is exacerbated when the  patient attempts to abduct  or raise the left upper extremity.  She also feels the pain down the left  arm, which she considers a chronic aching in the left arm.  ABDOMEN:  Soft and nondistended.  Bowel sounds are present.  No rebound or  guarding.  No hepatosplenomegaly.  UROGENITAL/RECTAL EXAM:  Deferred.  EXTREMITIES:  Show no evidence of clubbing, cyanosis, rash, lesions or  petechiae.  Once again, the left upper extremity extensor surface slightly  more edematous than the right.  The left upper extremity is not overall  edematous.  There is no warmth, no cords in the left upper extremity  indicative of deep vein thrombosis.  The ICD site is without erythema or  drainage.  It is tender to palpate over the site.  No joint deformity or  effusions.  No costovertebral angle tenderness.  NEUROLOGIC EXAM:  Without deficits.  Pulses are 4/4 dorsalis pedis regions  bilaterally, radial pulses 2+ bilaterally.   Chest x-ray shows changes consistent with chronic lung disease.  No active  infiltrates.  Electrocardiogram shows normal sinus rhythm.  No Q-waves, rate  of 63.  No ST changes.  No hypertrophy.  PR interval 212, QRS is 98, QTC  422.   LABORATORY STUDIES:  Sodium 139, potassium 3.6, chloride 103, carbonate  28.4, BUN 14, creatinine 1.2, glucose 73, digoxin level 0.6.   IMPRESSION:  Dictated after consultation with Dr. Stanford Breed, who has seen the  patient, examined the patient, and arrived at plan of action.  The  impression is that the patient is a 50 year old female with a past medical  history of sarcoid, idiopathic dilated cardiomyopathy, hypertension,  nonsustained ventricular tachycardia.  She is status post ICD placement in  December 2004.  The patient has complained of chest pain in the left upper  extremity and in the area over the ICD site since its implantation.  She was  admitted at the later part of January 2005.  She had a negative ultrasound  for DVT in the left upper  extremity, negative venogram.  She is continuing  to have pain in the left upper extremity with movement (the ICD is hitting  a nerve).  She was seen in the Emergency Room last night, given Demerol and  discharged.  She was scheduled to see Dr. Lovena Le today and missed the  appointment.  She is back in the Emergency Room on the evening of March 22  with pain.  Dr. Stanford Breed discussed with Dr. Lovena Le.   PLAN:  The patient will schedule a followup appointment with Dr. Lovena Le,  calling [phone].  She has missed multiple appointments previously.  Dr.  Lovena Le said we should avoid narcotics.   DICTATED FOR:  Kirk Ruths, M.D. [facility]      Sueanne Margarita, P.A.                    Kirk Ruths, M.D. [facility]    GM/MEDQ  D:  06/28/2003  T:  06/30/2003  Job:  [id number]   cc:   Blima Ledger, M.D.  [address] [place], [place] [address]  Fax: [place]. Young, M.D.  [place]. [place] [phone]  Fax: [phone]  Fransico Him, M.D.  [place]. [address] [place]  [place], [place] [address]  Fax: [phone]   Champ Mungo. Lovena Le, M.D.

## 2010-08-24 NOTE — Group Therapy Note (Signed)
NAME:  Rachel [name], Rachel [name] NO.:  [id number]   MEDICAL RECORD NO.:  [id number]          PATIENT TYPE:  WOC   LOCATION:  [facility]                   FACILITY:  [facility]   PHYSICIAN:  Nicoletta Dress, MD     DATE OF BIRTH:  June 27, 1960   DATE OF SERVICE:  10/17/2005                                    CLINIC NOTE   This 50 year old black female gravida 2 para 2 is referred here for severe  pelvic pain and menorrhagia.  Her last normal menstrual period was  approximately 3 months ago and lasted for 7 days.  Her periods are on the  heavy side, using an awful lot of pads, and it does stain her underwear and  occasionally her clothing.   Of significant for interest is one, she is followed in the medical clinic  with a diagnosis of sarcoidosis and she is on nasal oxygen amongst many,  many different medications.  She has been followed in the medical clinic  also with a 2-1/2 to 3 months' history of abdominal pain.  This pain is  characterized as being in the left lower quadrant and extending all the way  up to her rib cage and then across to the right.  It has been there pretty  much all day every day for the past 2-1/2 months.  She adds in a history of  nausea and vomiting at least 2 times a day.  She also adds that she has red  rectal bleeding when she has bowel movements.  She had an ultrasound of the  pelvis done on May 15 which revealed a normal-sized uterus with an  endometrium of 8 mm.  The right ovary contained a small simple cystic cyst,  2.9 x 2.5 cm.   Examination of her abdomen reveals generalized tenderness and guarding and  what appears to be rebound tenderness throughout her abdomen.  She would  rather that you rub her abdomen rather than push down on it.  An attempt  was made to do a digital vaginal exam, but this was too uncomfortable for  her.  I was not able at all to do a pelvic exam.  She wanted the rectal exam  deferred.   IMPRESSION:  1.  Abdominal pain  of unknown etiology.  2.  Abnormal uterine bleeding.   DISPOSITION:  Because of the significant abdominal pain and abnormal  physical exam, I referred her to the emergency room at [facility] this  afternoon.  I have spoken with the head nurse and explained my findings.  She will need a complete GI evaluation and may even require surgery at some  point.  At some point in time, she will need to have an endometrial uterine  biopsy done to ensure that the endometrial status is negative.  At that  time, she can be cycled with hormone therapy in an attempt to straighten out  her periods.  I do not believe that her present main problem is GYN related.           ______________________________  Nicoletta Dress, MD  ER/MEDQ  D:  10/17/2005  T:  10/17/2005  Job:  [id number]

## 2010-08-24 NOTE — H&P (Signed)
NAME:  Rachel [name], Rachel [name]                         ACCOUNT NO.:  [id number]   MEDICAL RECORD NO.:  [id number]                   PATIENT TYPE:  INP   LOCATION:  3707                                 FACILITY:  [facility]   PHYSICIAN:  Beatrix Fetters, MD                      DATE OF BIRTH:  April 15, 1960   DATE OF ADMISSION:  03/14/2003  DATE OF DISCHARGE:                                HISTORY & PHYSICAL   CHIEF COMPLAINT:  Shortness of air.   HISTORY OF PRESENT ILLNESS:  This patient is a 50 year old, African-American  female who noticed a progressive shortness of air especially with lying  down.  In the two days prior to admission, she reports that she has  experienced dyspnea with even mild exertion including limited activities  such as conversation.  She has had a positive nonproductive cough  progressing to cough productive of minimal brownish sputum over the last  week to two weeks.  She states that her chest feels tight, but has not had  specific chest pain.  She does complain of orthopnea and gives a history  consistent with paroxysmal nocturnal dyspnea.  The patient sleeps propped up  with pillows with the window open.   PAST MEDICAL HISTORY:  1. Sarcoid diagnosed in 1991.  2. Shingles.  The patient had an outbreak and in the process of resolving.  3. G2, P2 female who is status post BTL.   MEDICATIONS:  1. Gabapentin 300 mg t.i.d.  2. Vicodin one to two q.6h. p.r.n. pain.   FAMILY HISTORY:  The patient's mother has hypertension, obesity and is  status post CVA.  The patient has two children, one female and one female,  both of which are healthy.  Family history otherwise noncontributory.   SOCIAL HISTORY:  The patient lives with her daughter and her sister at home.  There is no smoke, no ethanol and no illicit drug use.  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   REVIEW OF SYMPTOMS:  As in HPI, plus the  patient had one episode of diarrhea  four days prior to admission which was nonbloody.  The patient has had no  visual changes and she does report dried zoster lesions in the left back.   PHYSICAL EXAMINATION:  VITAL SIGNS:  Temperature 97.9, heart rate 102, BP  998-338 systolic over 25-053 diastolic, respirations 97-67, O2 saturations  82% on room air, 96% on 3 L by nasal cannula.  GENERAL:  This is a well-nourished, well-appearing, African-American female  in no apparent distress.  HEENT:  Mucous membranes moist.  Oropharynx clear.  No lymphadenopathy.  CHEST:  Regular rhythm, tachycardic with no murmurs, rubs or gallops.  Dried, healing, fascicular lesions in the left posterior chest in a  dermatomal distribution that proceeds to the anterior chest  wall.  LUNGS:  Diffuse inspiratory and expiratory wheeze, very mild.  Positive  crackles in the right lung base, otherwise average to above average  aeration.  ABDOMEN:  Soft, nondistended, positive bowel sounds.  Mildly tender to  palpation in the epigastric region.  EXTREMITIES:  Dried healing fascicular lesions consistent with herpetic  lesions in right deltoid; 1-2+ pulses in the upper and lower extremities  bilaterally; mild swelling in the ankle bilaterally; nonpitting.   LABORATORY DATA AND X-RAY FINDINGS:  On admission, sodium 140, potassium  3.3, chloride 106, bicarb 25, BUN 16, glucose 88.  BNP 416.  D-dimer 0.37.  Cardiac enzymes with myoglobin 22, CK-MB less than 1, troponin I less than  0.05.   Chest x-ray shows new edema versus infiltrates on chronic interstitial  changes.  These are bibasilar.   ASSESSMENT:  1. This is a 50 year old, African-American female with history of sarcoid,     zoster, panic attacks and exercise-induced asthma with three- to four-day     history of progressive dyspnea and positive interval known chest x-ray     findings.  The last chest x-ray was taken in April 2004.  The patient is      insistent in her history that the current symptoms are different than her     past exacerbations of sarcoid which themselves have been associated with     minor respiratory impairment.  Her history does yield positive paroxysmal     nocturnal dyspnea and orthopnea.  She has crackles on exam.  She had an     increased B-type natriuretic peptide and she has edema and/or infiltrates     on chest x-ray.  All these are consistent with picture of heart failure.  2. Herpes zoster.  This appears to be resolving.  3. History of panic attacks and steroid-induced psychosis.   PLAN:  1. Attempt will be made to address potential causes of heart failure by     cycling cardiac enzymes to rule out myocardial infarction.  The patient     had an electrocardiogram in the emergency room which showed sinus     tachycardia.  We will control her blood pressure and treat her sarcoid in     case that is the cause of her impaired cardiopulmonary situation.  She is     not a drug user as such that it is not likely a cause of her potential     heart failure.  Other possibilities include infectious process, but the     patient is afebrile.  2. We will check capillary blood count, follow temperature curve and repeat     chest x-ray to ensure that her lung parenchyma does not undergo     additional change.  Will hold antibiotics for now.  3. In regard to sarcoid, while it does cause granulomatous infiltration, it     is not clear at this point that it is causing her shortness of air.  Will     treat the sarcoid by providing the patient with inhaled steroids, Flovent     88 mcg two times a day.  Give Lasix 40 mg intravenous two times a day to     reduce any fluids on the lungs.  4. Will restrict her diet, low sodium, and fluid restricted at 1200 cc per     day.  5. Will order 2D echocardiogram to assess left ventricular function.  6. Will maintain the patient's outpatient medication of Gabapentin 300 mg  t.i.d. on a  p.r.n. basis.  Also provide Vicodin one to two q.6h. p.r.n.     pain.  7. The patient has been on Paxil in the past and seems to be agreeable to     this medication for anxiety.  As such, we will start Paxil 20 mg p.o.     q.d.                                                Beatrix Fetters, MD    TD/MEDQ  D:  03/14/2003  T:  03/14/2003  Job:  [id number]

## 2010-08-24 NOTE — H&P (Signed)
NAMEJOLINE, Rachel [name] NO.:  [id number]   MEDICAL RECORD NO.:  [id number]          PATIENT TYPE:  INP   LOCATION:  2009                         FACILITY:  [facility]   PHYSICIAN:  Jamal Collin. Hensel, M.D.DATE OF BIRTH:  Mar 27, 1961   DATE OF ADMISSION:  12/11/2005  DATE OF DISCHARGE:                                HISTORY & PHYSICAL   PRIMARY CARE PHYSICIANS:  1. Dr. Randa Spike of [facility]  2. Dr. Keturah Barre, Pulmonary.  3. Dr. Hillis Range, Ophthalmology.  4. Dr. Cristopher Peru, EP.  5. Dr. Fransico Him, [facility] Card[facility].   CHIEF COMPLAINT:  Chest pain.   HISTORY OF PRESENT ILLNESS:  This is a 50 year old African American female  with sarcoid (stage IV pulmonary with ocular involvement) and somatization  disorder, borderline personality disorder per clinic notes, whose last  ejection fraction in November 2006 was 55% to 65%, who presents to the ED,  urgent care center and the [place] [facility] very frequently, now complaining  of chest pain that localizes to beneath her left breast that radiates up to  her face, down her left arm and down her left leg with associated shortness  of breath (she is currently on 4 L of O2 nasal cannula at her baseline).  She does not report a change in her strength in any extremity or altered  mental status, but her review of systems is mostly pan-positive, as it  usually is in clinic and at most of her visits.  The chest pain is described  as sharp, under her left breast, and reproducible with touch.  The pain is  unable to be relieved by anything.  There is no change in her home O2  requirement.   REVIEW OF SYSTEMS:  Positive for fatigue, dizziness, headache, malaise,  cough, pelvic pain that is chronic and exacerbated by urination, bright red  blood per rectum when she wipes during her period.  Negative for  diaphoresis, rashes, lesions, nausea, vomiting or constipation.  Last  menstrual period was November 29, 2005.   PAST MEDICAL HISTORY:  1. 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  2. Anxiety with history of panic attacks.  3. 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  4. Congestive heart failure with her last EF greater than 50% -- she does      have a [employer] single-chamber defibrillator, placed in December 2004.  5. GERD.  6. Hypertension.  7. Baseline creatinine is 1.0.  8. Multiple other suspected psychiatric disorders including bipolar      disorder and somatization disorder.   ALLERGIES:  DARVOCET causes pruritic rash.   MEDICATIONS:  1. Albuterol two puffs q.4 h. p.r.n. wheezing.  2. Elavil 50 mg p.o. daily.  3. Enalapril 5 mg p.o. daily.  4. Hydrochlorothiazide 25 mg p.o. daily.  5. Lasix 20 mg p.o. daily.  6. Lorazepam 1 mg p.o. b.i.d. p.r.n. anxiety.  7. Nasonex 50 mcg two nasal sprays in each nostril daily.  8. Paxil 50 mg p.o. daily -- the patient reports not being able to afford  this medication and has not taken it in an unknown period of time.  9. Protonix 40 mg p.o. daily.  10.Toprol-XL 25 mg p.o. daily.  11.The patient does not recall being on calcium carbonate or ferrous      fumarate, but both of these are updated in her current clinic chart.   SOCIAL HISTORY:  The patient was remarried in September 2006 and lives with  her husband and daughter.  She also has an older son.  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  She  has a remote tobacco history of 1 pack per day x20 years, but she quit in  1994, and a remote alcohol abuse history, but quit that as well in 1994.   FAMILY HISTORY:  Mother had diabetes, hypertension and CHF.  Father died in  his 57s due to lung cancer.  No family history of breast cancer or other  cancers.   PHYSICAL EXAM:  VITAL SIGNS:  Temperature 97.6, heart rate 82, respirations  20, blood pressure 139/79, saturation 98% on 2-L nasal  cannula.  GENERAL APPEARANCE:  African American female who appears stated age, sitting  up in bed, speaks mostly with her eyes closed, in no apparent distress.  MENTAL STATUS:  Awake, alert and oriented x3.  HEENT:  Atraumatic.  PERRL.  EOMI.  Moist mucous membranes.  Normal  oropharynx.  NECK:  Supple.  No significant lymphadenopathy or JVD.  CHEST:  Tender to palpation on the left chest wall under her left breast.  PULMONARY:  Diffuse fine crackles in all fields, no rhonchi.  Questionable  decreased breath sounds in the right base.  HEART:  Regular rate and rhythm, no murmurs, rubs, or gallops.  ABDOMEN:  Positive bowel sounds.  Obese.  Very tender to palpation, but not  to any movement of the bed or when she sits up easily.  She does have some  voluntary guarding, but no rebound.  EXTREMITIES:  Pulses 2+.  Moving all extremities.  Tender to palpation in  the left upper and lower extremities.  No erythema.  Trace edema in the  lower extremities bilaterally.  GENITAL/RECTAL:  Deferred until the a.m.  NEUROLOGIC:  Cranial nerves II-XII intact.  Strength 5/5 in all extremities.  Sensation equal and intact in all extremities.  Normal gait.  SKIN:  No lesions or rashes noted.   LABORATORY DATA:  Point-of-care enzymes x1 negative  D-dimer 1.99.  BNP 766,  decreased from 861 on November 18, 2005.  Sodium 137, potassium 3.6, chloride  104, bicarb 28.6, BUN 14, creatinine 1.3, glucose 84, hemoglobin 13.6,  hematocrit 40.0.   RADIOLOGIC FINDINGS:  Chest x-ray:  Stable cardiomegaly and chronic  interstitial disease, no acute findings.   ASSESSMENT AND PLAN:  This is a 50 year old African American female, well-  known to our practice, with stage IV pulmonary sarcoidosis with ocular  involvement with a 4-L oxygen home requirement, defibrillator secondary to  cardiomyopathy, congestive heart failure with her last ejection fraction of 55% to 65%, and multiple psychiatric issues including  somatoform disorder,  who now complains of worsening chest pain and shortness of breath.   1. Chest pain:  Her pain is reproducible on palpation and not distributed      in any logical pattern to be explained by a cardiac source.  This is      likely musculoskeletal in origin.  Her BNP is elevated, but decreased  in the last 3 weeks.  Point-of-care enzymes are negative x1.  Given her      true extensive lung and heart disease, we will admit her for serial      cardiac enzymes, check a chest x-ray and EKG in the morning along with      a repeat BNP.  She is status post 80 mg of Lasix intravenously x1 in      the emergency department and we will follow this clinically for effect,      currently on beta blocker, Tylenol, Motrin and nitroglycerin as needed      for pain.  We will repeat Lasix in the morning, dosing dependent on      effect.  2. Shortness of breath:  The patient reports increased shortness of      breath, but not currently working to breathe, no tachypneic on exam      (respiration rate 16) and saturating at 98% on 2 L (oxygen at home is 4      L).  Lungs do sound wet.  Lasix was given.  We will follow for effect.  3. Congestive heart failure:  Continue her on her hydrochlorothiazide,      Toprol-XL and enalapril per her home regimen.  We will continue      intravenous Lasix and dose depends on vitals and diuresis overnight.      Followup morning chest x-ray and EKG as above.  4. Sarcoid:  On home oxygen.  No current steroids.  We will need to follow      up with this with her primary care physician in the morning, follow up      chest x-ray and oxygen saturations as above.  5. Psychiatric:  We will continue her Paxil, Elavil and Lorazepam per her      home dosing.  She needs close followup with her primary care physician      and has an appointment scheduled for Thursday already, which she will      need to keep.  She also has a mammogram scheduled on December 18, 2005       and a gynecological appointment at [facility] at December 30, 2005, both of      which we encourage her to keep as well.  6. Fluids, electrolytes and nutrition/gastrointestinal:  Heart-healthy      diet, saline lock intravenously.  7. Prophylaxis:  Protonix and sequential compression devices with early      ambulation.   DISPOSITION:  We will admit for 24-hour observation, anticipate a short stay  for diuresis and MI rule-out.  Follow up closely with PCP on Thursday.     ______________________________  Mayra Neer, M.D.    ______________________________  Jamal Collin. Andria Frames, M.D.    KS/MEDQ  D:  12/11/2005  T:  12/11/2005  Job:  [id number]

## 2010-08-24 NOTE — H&P (Signed)
NAME:  Rachel [name], Rachel [name].:  [id number]   MEDICAL RECORD NO.:  [id number]          PATIENT TYPE:  EMS   LOCATION:  [facility]                         FACILITY:  [facility]   PHYSICIAN:  Billey Chang, M.D.     DATE OF BIRTH:  1960-07-10   DATE OF ADMISSION:  12/30/2004  DATE OF DISCHARGE:                                HISTORY & PHYSICAL   CHIEF COMPLAINT:  Shortness of breath and chest pain.   HISTORY OF PRESENT ILLNESS:  [name] has a 50 year old patient of Dr.  Milinda Antis at [facility] with a history of sarcoidosis,  CHF, and cardiomyopathy, who presents today with a week plus history of  chest pain, shortness of breath, headache, and numbness in her left hand and  left leg.  For the past week, she has been in [place], [place], on her  honeymoon and has had increased physical activity.  She describes her chest  pain as intermittent, it increases when she rests, and when she is up  moving, it improves somewhat although it is still present.  It feels like a  burning in her rib cage.  Nothing seems to relieve it except for  repositioning herself.  She also complains of shortness of breath which has  been present consistently the entire week.  She also has been having  headaches which she describes as frontal and temporal in nature, also  throbbing with accompanying photophobia and nausea, but no vomiting.  She is  also complaining of left sided numbness in her hands, flanks, and legs.   REVIEW OF SYSTEMS:  Positive for subjective fever and fatigue.  Positive for  chest pain and palpitations.  Positive for shortness of breath, dyspnea on  exertion, and cough.  Positive for nausea and constipation.  Denies vomiting  and diarrhea.  She does complain of some abdominal pain.  Denies dysuria or  hematuria.  Denies skin rash.  She does complain of tight feeling in her  hands and lower extremity edema.   PAST MEDICAL HISTORY:  1.  Sarcoidosis.  2.   Anxiety with panic disorder.  3.  History of cardiomyopathy and congestive heart failure with an ejection      fraction of less than 50%.  4.  Ventricular tachycardia nonsustained with syncope status post AICD      placement.  5.  History of steroid psychosis.  6.  Lactose intolerance.  7.  Gastroesophageal reflux disease.  8.  Allergic rhinitis.   CURRENT MEDICATIONS:  Albuterol 2 puffs q.4h. p.r.n., Altace 5 mg p.o.  daily, Carvedilol 25 mg p.o. b.i.d., Lanoxin 0.125 mg p.o. daily which she  is not taking, Lasix 20 mg p.o. daily p.r.n., Lorazepam 1 mg p.o. b.i.d.  p.r.n., Paxil 20 mg p.o. daily, Percocet 5-325, 1-2 tablets p.o. q.4h.  p.r.n., Protonix 40 mg p.o. daily p.r.n.   ALLERGIES:  Steroids, she has psychosis at high doses (at 80 mg daily of  prednisone), Darvocet which gives her a rash, and latex which gives her a  rash.   PAST SURGICAL HISTORY:  Placement of defibrillator in December  2004,  bilateral tubal ligation in January 1992.   FAMILY HISTORY:  Mother with borderline diabetes and hypertension.  Father  is healthy.  No family history of breast cancer or other cancers.   SOCIAL HISTORY:  She was just married, husbands name is Metzli Pollick, she  lives with her daughter, also, has a son name [name].  She is a Network engineer for  [employer].  Remote history of tobacco abuse, she quit in 1994, had a 20-pack-year  history prior to that, quit drinking alcohol in 1994.   PHYSICAL EXAMINATION:  VITAL SIGNS:  Temperature 97.8, pulse 72, blood pressure 135/91 up to  162/106, respirations 18 up to 24 saturating 91% on room air.  GENERAL:  She is in mild distress but oriented.  HEENT:  Pupils equal, round, reactive to light, extraocular movements  intact, oropharynx without erythema or exudate.  NECK:  No lymphadenopathy, no thyromegaly.  CHEST:  She is tender to palpation in her left chest, particularly in the  axillary region.  CARDIOVASCULAR:  Regular rate and rhythm, no murmurs are  noted.  LUNGS:  She has crackles bilaterally, right greater than left.  ABDOMEN:  Soft, nontender, nondistended, no masses, good bowel sounds.  EXTREMITIES:  She has trace lower extremity edema.  She has 2+ distal  pulses.  NEUROLOGICAL:  Cranial nerves 2-12 were grossly intact, she has 5/5 strength  in her biceps, triceps, and grip strength bilaterally.  She does have  decreased strength in her lower extremities, so she is refusing to perform  any lower extremity muscular testing with the exception of 5/5 strength with  plantar flexion and dorsiflexion.  SKIN:  No rashes apparent.  LYMPH:  I do not appreciate any lymphadenopathy.   LABORATORY DATA:  ISTAT shows sodium 139, potassium 4, chloride 108, bicarb  23.4, BUN 23, creatinine 1.2, glucose 1.1.  The pH 7.385, pCO2 39.1.  Cardiac enzymes point of care, myoglobin 38.5, CK MB less than 1, troponin I  less than 0.05.  A BNP was 289.7.  Sed rate and ACE rates are pending.  Chest x-ray showed severe pulmonary fibrosis with honeycomb lungs increasing  bibasilar edema and infiltrate.   ASSESSMENT/PLAN:  1.  This is a 50 year old female with sarcoidosis with shortness of breath,      fatigue, and subjective fever, obviously concerning for an acute flare,      however, given her psychosis on prednisone in the past and her apparent      fluid overload, I am hesitant to start steroids.  I will check a sed      rate and ACE level and if increased, consider starting steroids.  Will      continue Albuterol.  She does report that on lower doses of prednisone,      she does not have the psychotic type affects.  She has been on her      honeymoon on the last nine days and I feel that her increasing      inactivity may have contributed to her flare.  2.  CHF exacerbation.  She has a known history of CHF with an EF between 20      and 40% (20% on a catheterization in 2004, 40% on an MR in 2004).  She     does have chest x-ray findings consistent  with CHF exacerbation.  Will      continue her Coreg and Altace and check a digoxin level and consider      restarting that  for comfort.  We are going to admit her and diurese her      with Lasix at 40 mg IV b.i.d. and follow her Is and Os and      BNP.  Will also monitor her creatinine carefully as it is currently 1.2.  3.  History of anxiety and panic disorder.  Continue her Paxil and her      p.r.n. Lorazepam.  4.  GI prophylaxis.  Continue Protonix.      Esperanza Richters, MD    ______________________________  Billey Chang, M.D.    JT/MEDQ  D:  12/31/2004  T:  12/31/2004  Job:  [id number]   cc:   Randa Spike, MD  Fax: [phone]

## 2010-08-24 NOTE — Consult Note (Signed)
Rachel [name], Rachel [name] NO.:  [id number]   MEDICAL RECORD NO.:  [id number]          PATIENT TYPE:  INP   LOCATION:  2009                         FACILITY:  [facility]   PHYSICIAN:  Fransico Him, M.D.     DATE OF BIRTH:  [birth year]   DATE OF CONSULTATION:  12/11/2005  DATE OF DISCHARGE:                                   CONSULTATION   CHIEF COMPLAINT:  Shortness of breath and questionable defibrillator firing.   HISTORY OF PRESENT ILLNESS:  This is a 50 year old African-American female  very well-known to me with a history of congestive heart failure due to  dilated cardiomyopathy from sarcoidosis.  Her most recent 2-D echocardiogram  though showed increased EF 55-65%.  She also has a history of hypertension  and is status post single chamber defibrillator on March 28, 2003 for  MADIT II criteria, and also for VT and syncope.  She was admitted to [facility] yesterday with complaints of chest pain and dyspnea.  Chest  pain is located below her left breast radiating to substernal area and down  her left leg.  This is a chronic pain which she frequently complains of and  is very  tender to touch when you palpate along that area.  It is worse with  lying down versus sitting up.  Clearly, it is much worse when you palpate  along the under portion of her breast and it is nonexertional.  She has  noted increasing shortness of breath along with increased heat in her body  and face, also some nausea and dizziness.  Apparently, her chest pain is  relieved with Advil.  She has been on home oxygen at 4 liters for the past  year and has noted increasing fatigue, as well as three-pillow orthopnea  since 2004.  She denies any PND.  She has also noticed, when she was doing a  self-breast exam, that she had a knot in her axilla, as well as swelling in  the right breast.  She has also noted intermittent lower extremity edema.  On admission, her BNP was noted to be elevated at  766.  She was given 80 mg  IV Lasix in the emergency room and then IV Lasix 40 mg this morning.  EKG on  admission shows sinus rhythm with first-degree AV block and nonspecific T-  wave abnormality.  QTC was prolonged at 458-492 milliseconds.  We are now  asked for consult for further evaluation of congestive heart.  Apparently,  the patient also said that her defibrillator had fired, although she has  reported this in the past, and usually with interrogation there has never  been any firing when she thought it did.  Her last 2-D echocardiogram one  year ago showed normal LV function, EF 55-65%, aortic valve was mildly  calcified. We are now asked to evaluate.   PAST MEDICAL HISTORY:  1. Idiopathic dilated cardiomyopathy, presumably secondary to sarcoid.      Her most recent EF by echo a year ago showed EF 55-65%.  2. Status post [employer]  single chamber defibrillator on March 28, 2003.  3. Hypertension.  4. Gastroesophageal reflux.  5. 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 Anxiety with history of panic attacks.  7. Multiple other suspected psychiatric disorders including bipolar and      somatization disorder.   ALLERGIES:  DARVOCET CAUSES PRURITIC RASH AND __________  STEROIDS CAUSE  PSYCHOSIS.   MEDICATIONS ON ADMISSION:  1. Enalapril 5 mg a day.  2. HCTZ 25 mg a day.  3. Lasix 20 mg a day.  4. Toprol XL 25 mg a day.  5. Protonix 40 mg a day.  6. Elavil 50 mg a day.  7. Albuterol 2 puffs every 4 hours for wheezing.  8. Lorazepam 1 mg b.i.d. as needed for anxiety.  9. Nasonex 2 sprays each nostril daily.  10.Paxil 50 mg a day, but she has not been taking it.  11.Calcium carbonate.  12.Ferrous fumarate.   FAMILY HISTORY:  Her father died in his 47s with lung CA.  He had an MI.  Her mother is alive with CHF, hypertension, diabetes.   SOCIAL HISTORY:  She was married last year.  She lives with her husband and  daughter.  She also has an older son.  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  She has a remote history  of tobacco use, 20 pack years with cessation in 1994.  She has a former  remote history of alcohol abuse with history of cessation in 1994.  She  denies any current tobacco or alcohol use or illicit drug use.   REVIEW OF SYSTEMS:  Other than what is stated in the HPI includes bilateral  neck pain, headaches, gait disturbance with recent falls, eye floaters,  blurred vision and burning when urinating.   PHYSICAL EXAM:  VITAL SIGNS:  Blood pressure is 90/62, but when rechecked  was 93/59.  Respirations 20, O2 saturations 94% on 4 liters, pulse 96.  HEENT:  Benign.  NECK:  Supple without lymphadenopathy, carotid strokes +2 bilaterally, no  bruits.  LUNGS:  Clear to auscultation throughout.  HEART:  Regular rate and rhythm.  No murmurs, rubs or gallops.  No S1, S2.  ABDOMEN:  Soft, nontender, nondistended.  Normoactive bowel sounds.  No  hepatosplenomegaly.  EXTREMITIES:  No cyanosis, erythema or edema.  Good distal pulse  bilaterally.  NEUROLOGIC:  She is alert and oriented x3 with no focal deficits.  BREAST:  Of note, she is very tender to palpation under the left breast  which completely reproduces her chest pain that she complains of.  Also, on  right breast exam, her breast is very firm and significantly different than  the left breast.   LABORATORY DATA:  Sodium 138, potassium 3.3, chloride 101, bicarb 25, BUN  14, creatinine 1.2, glucose 77, white cell count is 6.8, hematocrit 34.9,  hemoglobin 11.3, platelet count 182.  Serial cardiac enzymes:  CPK 47, MB  1.1, troponin 0.202.  BNP was elevated at 766 and down today is 688, D-dimer  elevated at 1.99.   Chest x-ray showed stable cardiomegaly and chronic interstitial lung  disease.  No acute findings.  EKG showed sinus rhythm with first-degree AV block and nonspecific T-wave change.  There was a prolonged QTC.  Her   telemetry has showed normal sinus rhythm with occasional PVCs.  An ICD  interrogation did reveal normal AICD function, no evidence of firing of her  defibrillator, although during the interrogation,  she did have one 5 beat  run of VT, but that was in the setting of potassium of 3.3.   ASSESSMENT:  1. Atypical chest pain most consistent with musculoskeletal etiology.      Cardiac enzymes are negative.  2. Dyspnea possibly due to component of volume overload, but most likely      is due to her pulmonary sarcoidosis.  The CAT scan of her chest did not      show any significant pulmonary edema.  There was evidence of a very      small right pleural effusion and there was obstruction involving all      lobes of both lungs consistent with end-stage sarcoid.  There was no      evidence of pulmonary embolus.  There was a very tiny pericardial      effusion which apparently was new.  Also, new ascites was noted.  There      was cardiomegaly with marked right atrial enlargement and passive      congestion of the liver.  I suspect she probably does have a component      of heart failure, although she has had normal LV function a year ago.      It possibly could be due to right-sided heart failure especially given      the ascites and the hepatic congestion noted on chest CT.  Most likely,      this is secondary to development of pulmonary hypertension from her      sarcoidosis.  3. Status post [employer]. Jude single chamber defibrillator due to V-tach and      syncope with no firing of defibrillator since 2004.  4. History of ventricular tachycardia with one 5-beat run during the      interrogation of her defibrillator, most likely secondary to      hypokalemia.  5. Hypokalemia.  6. History of CHF.  Again, she probably has some mild right-sided heart      failure on exam with elevated BNPs and history of lower extremity edema      prior to admission, as well as the findings on chest CT which showed       marked right atrial enlargement, passive congestion of the liver, and      ascites, which is new.  7. Idiopathic dilated cardiomyopathy, presumably secondary to sarcoidosis      which with EF a year ago normalized.  8. Hypotension.  Enalapril held today.  9. Hypokalemia.  10.Anemia.  11.Burning urination.  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  13.Gastroesophageal reflux.  14.Anxiety with panic attacks.  15.Multiple other suspected psychiatric disorders, including bipolar and      somatization disorder.   PLAN:  1. Myocardial infarction was ruled out.  Her chest pain is very atypical      and is most consistent with musculoskeletal etiology.  It does improve      with taking nonsteroidal anti-inflammatories and it is completely      reproduced by palpation under her left breast.  2. Would continue Lasix but would probably change the dose to p.o. now.     Would recheck a BNP in the morning.  3. Replete the potassium and recheck another potassium tonight.  4. Check a UA.  5. Defibrillator interrogation was performed today.  6. Will repeat a 2-D echocardiogram in the morning to make sure there has      been no change in her LV function,  but I suspect that she probably has      developed pulmonary hypertension from her end-stage sarcoidosis of her      lung and now is having some right-sided heart failure with cor      pulmonale.      Fransico Him, M.D.  Electronically Signed     TT/MEDQ  D:  12/11/2005  T:  12/12/2005  Job:  [id number]

## 2010-08-24 NOTE — Discharge Summary (Signed)
Rachel [name], [name] NO.:  [id number]   MEDICAL RECORD NO.:  [id number]          PATIENT TYPE:  INP   LOCATION:  6735                         FACILITY:  [facility]   PHYSICIAN:  Methow A. Rachel [name], M.D.    DATE OF BIRTH:  1960/10/28   DATE OF ADMISSION:  08/13/2005  DATE OF DISCHARGE:  08/16/2005                                 DISCHARGE SUMMARY   DISCHARGE DIAGNOSES:  1.  Chest pain.  2.  Gastroesophageal reflux.  3.  Sarcoid.  4.  Congestive heart failure.  5.  Panic disorder.   PROCEDURES:  Pacemaker interrogation on Aug 15, 2005 which showed normal  functioning pacemaker.   DISCHARGE MEDICATIONS:  1.  Albuterol M.D.I. two puffs q.4h. p.r.n.  2.  Enalapril 5 mg daily.  3.  Lasix 40 mg daily.  4.  Toprol-XL 25 mg daily.  5.  Protonix 40 mg b.i.d.  6.  Os-Cal plus vitamin D t.i.d. with meals.  7.  Ferrous fumarate 200 mg daily.  8.  Paxil XR 37.5 mg daily.  9.  Ativan 1 mg b.i.d. p.r.n. anxiety.  10. Nasonex two sprays per nostril daily.  11. Percocet 5/325, 1-2 tablets q.4-6h. p.r.n. pain.   HOSPITAL COURSE:  The patient is a 50 year old woman who presented to the  emergency department from pulmonary rehabilitation complaining of acute  chest pain for the day and a half prior to admission.  The pain initially  started at home during dinner and was located in the left chest and then  proceeded to the midsternal area with radiation to the neck.  The pain is  described as sharp in nature and felt burning in the throat.  The patient  did not try anything at home to relieve her pain.  During this painful  episode, she found it hard to swallow food, but was tolerating liquids.  The  pain was associated with nausea but no vomiting and diaphoresis.  While at  pulmonary rehabilitation  on the day of admission, she noted that her pain  was increased, and she was sent to the emergency department to be evaluated.  Her chest pain was relieved with a nitroglycerin tablet  she was given while  in the emergency department.   REVIEW OF SYMPTOMS:  She noted increased weight gain, positive orthopnea and  positive paroxysmal nocturnal dyspnea.  She noted positive dark stools.  No  constipation.  She also noted increased lower extremity edema and numbness  of the last two digits on her left hand radiating from her neck.   LABORATORY EVALUATION AT THE TIME OF ADMISSION:  Hemoglobin 13.9, hematocrit  41, sodium 139, potassium 3.7, chloride 104, bicarbonate 29.7, BUN 10,  glucose 75, PTT 14.6, INR 1.1, myoglobin 42.1.  CK-MB less than 1, troponin  less than 0.05.  Chest x-ray showed chronic interstitial disease.  CT of the  chest showed no pulmonary embolus, marked pulmonary interstitial fibrosis.   HOSPITAL COURSE BY PROBLEM:  1.  Chest pain.  The patient's pain on admission was not consistent with her  known history of sarcoidosis.  So, therefore, she was not started on      steroids.  Due to the fact that with the patient's complicated medical      history, there many sources of possible chest pain etiologies including      sarcoidosis, CHF and reflux.  She was ruled out with cardiac enzymes x3.      A BNP was obtained which was within normal limits, and she was placed on      telemetry.  She had a spiral CT of the chest which ruled out pulmonary      embolus, and, due to the fact that she had a known history of reflux,      her proton pump inhibitor was doubled to 40 mg b.i.d.  Initially, she      was having symptom relief with the increased PPI, so it was thought that      her chest pain, which was atypical in nature, was likely a reflux      exacerbation.  However, the patient then began complaining of pain in      her pacer site which started as a burning at her pacer and spread across      her chest and up into her throat when the pacer fired.  Due to this      concern, electrophysiology was consulted, and the patient's pacer was      interrogated,  which showed it was functioning within normal limits.  The      patient was ruled out for any serious cardiac or pulmonary etiology, and      it was felt that it was a combination of the patient's anxiety and      reflux.  It was also determined that the patient's numbness and tingling      of the digits on her left hand was likely secondary to her cervical      radiculopathy which was known prior to admission, and that the numbness      and tingling in her lips was related to her anxiety and panic disorder.      At the time of discharge, the patient was continued on her home regimen      of enalapril, Toprol XL, Paxil, Ativan, and her Protonix, as mentioned,      was increased to 40 mg b.i.d.  2.  Gastroesophageal reflux disease.  As discussed in problem #1, the      patient has a known history of reflux.  At the time of admission, her      PPI was increased to b.i.d. which provided some symptomatic relief.  At      the time of discharge, she was discharged home on Protonix 40 mg p.o.      b.i.d. to follow up with her primary care physician/  3.  Sarcoid. The patient has a known history of stage IV sarcoidosis, for      which she is maintained on 4 liters of home O2 via nasal cannula.      During this hospitalization, her O2 requirement did not increase, and      her pain was not described as typical of her sarcoid.  Therefore, she      was not started on a burst of steroids.  This problem remained stable      throughout her hospitalization, and she was discharged home without      complication.  4.  Congestive heart failure.  The  patient was admitted with a known history      of congestive heart failure, for which she took Lasix 20 mg on a p.r.n.      basis.  At the time of admission, she was noted to have increased lower      extremity edema, and her Lasix was increased to 40 mg b.i.d.  The      patient responded appropriately and diuresed.  She was adequately     diuresing prior to the  time of discharge, and her Lasix dose was      decreased to 40 mg daily prior to discharge.  She is to follow up with      her primary care physician in order to determine her Lasix dose, as well      as the need for potassium in the face of a diuretic.  5.  Panic disorder.  The patient was admitted with a known history of      anxiety and panic disorder.  She was continued on her Paxil while here      at [facility].  There was some concern that every time the      patient was ruled out for something serious, her symptoms tended to move      or migrate to a new body system.  The patient was discharged home on her      home Paxil regimen of 37.5 mg daily.  However, there was some talk of      increasing her dose or having her obtain a psychiatric consultation as      an outpatient.   LABORATORY DATA FROM THE DAY OF DISCHARGE:  Sodium 138, potassium 3.4,  chloride 100, bicarbonate 31, BUN 8, creatinine 0.9, glucose 110, calcium  8.5.   ITEMS TO FOLLOW UP AT DISCHARGE:  1.  The patient was to call and schedule an appointment with her primary      care physician, Dr. Milinda Antis at [facility].  2.  The patient's Lasix dose will need to be followed as an outpatient to      determine whether her dose needs to be increased or decreased back to 20      mg daily on a p.r.n. basis.  3.  The patient will need follow-up laboratory values to determine if she      needs potassium repletion in the face of a      diuretic.  4.  It was discussed among the team that the patient may benefit from      psychiatric intervention secondary to her panic disorder.      Annye Asa, M.D.    ______________________________  Rachel [name]. Rachel [name], M.D.    KT/MEDQ  D:  09/17/2005  T:  09/17/2005  Job:  [id number]   cc:   Randa Spike, MD  Fax: [phone]

## 2010-08-24 NOTE — Discharge Summary (Signed)
NAME:  Rachel [name], Rachel [name]                      ACCOUNT NO.:  [id number]   MEDICAL RECORD NO.:  [id number]                   PATIENT TYPE:  INP   LOCATION:  2012                                 FACILITY:  [facility]   PHYSICIAN:  Champ Mungo. Lovena Le, M.D.               DATE OF BIRTH:  1960/07/03   DATE OF ADMISSION:  05/02/2003  DATE OF DISCHARGE:  05/04/2003                                 DISCHARGE SUMMARY   HISTORY OF PRESENT ILLNESS:  This is a 50 year old female with past medical  history of pulmonary sarcoid diagnosed in 1991 with panic attacks, who was  recently admitted from [facility] on March 14, 2003 for complaint  of shortness of breath, progressive weakness, fatigue and chest tightness.  She had a stress Cardiolite on March 17, 2003 with negative ischemia and  an EF of 22%.  She was seen in consult at that time by EP for placement of  an internal cardiac defibrillator.  An MRI was obtained and she had no  discernable evidence of LV myocardial sarcoid and subsequent placement of an  internal defibrillator.  The patient now presents with a complaint of left  arm and left chest wall pain and left leg weakness which started after ICD  placement in December.  She has positive tingling in her fingers and slight  mottling of the fingers on her left side, as well as left-sided weakness and  tingling in her leg, as well as pain up the left side of her neck.  The  patient's secondary diagnoses are pulmonary sarcoidosis, nonsustained VT,  CHF, hypotension, anxiety attacks, syncope, exercise-induced asthma and  steroid induced psychosis.  The patient was seen in consult by neurology  service, who did not seem to think that there was a central cause of the  patient's symptoms.  The patient was then seen by Dr. Champ Mungo. Lovena Le, whom  admitted patient, and there was no clear swelling or erythema or her left  arm.  Chest wall was tender and it backed down to sacrum and legs.  The  patient was admitted for pain control.   HOSPITAL COURSE:  The patient underwent an ultrasound of her left arm which  showed no evidence of DVT or SVT.  The patient had also had a consult placed  with pain services and was placed on a Lidoderm patch.  The patient was to  be followed in the pain clinic as needed for her pain.   DISCHARGE MEDICATIONS:  She was discharged on all of her previous  medications:  1. Lasix 20 mg daily.  2. Prednisone 10 mg daily.  3. Paxil 20 mg daily.  4. Protonix 40 mg daily.  5. Digoxin 0.125 mg daily.  6. Lorazepam 1 every 12 as needed.  7. Altace 2.5 mg daily.  8. Coreg 3.125 mg b.i.d.  9. Calcium carbonate 1 g b.i.d.  10.      Vitamins as  before.  11.      Lidoderm patch 5% on for 12 hours and off for 12 hours.   ACTIVITY:  She was also to have a consult for outpatient occupational  therapy.   DIET:  Low-fat, low-salt, low-cholesterol diet.   FOLLOWUP:  She was to follow at the pain center p.r.n. as well as [facility] for an outpatient EMG; the patient was to call for an appointment.  She was also to follow with Dr. Champ Mungo. Lovena Le, June 28, 2003, at 11:30  for followup with her ICD.  She was to call Dr. Blima Ledger for an  appointment within 1 week and continue to follow with Dr. Fransico Him as  previous.      Forest Becker, C.R.N.P. [facility]                 Champ Mungo. Lovena Le, M.D.    DS/MEDQ  D:  05/04/2003  T:  05/05/2003  Job:  [id number]   cc:   Champ Mungo. Lovena Le, M.D.   [facility]   Blima Ledger, M.D.  [place], [place] [address]  Fax: [phone]   Fransico Him, M.D.  [place]. [address], [place]  [place], [place] [address]  Fax: [phone]

## 2010-08-24 NOTE — Consult Note (Signed)
NAME:  Rachel [name], Rachel [name]                         ACCOUNT NO.:  [id number]   MEDICAL RECORD NO.:  [id number]                   PATIENT TYPE:  INP   LOCATION:  3740                                 FACILITY:  [facility]   PHYSICIAN:  Clinton D. Annamaria Boots, M.D.              DATE OF BIRTH:  [birth year]   DATE OF CONSULTATION:  03/14/2003  DATE OF DISCHARGE:                                   CONSULTATION   CONSULTING PHYSICIAN:  Clinton D. Annamaria Boots, M.D.   REFERRING PHYSICIAN:  Talbert Cage, M.D.   REASON FOR CONSULTATION:  This is a 50 year old black female with a history  of sarcoidosis presenting now with dyspnea.   HISTORY OF PRESENT ILLNESS:  This 50 year old African-American woman was  originally seen by me in October of 2000 on referral from the family  practice teaching service for help with management of pulmonary sarcoidosis  which apparently was made by radiologic appearance about nine years  previously.  She had elevated angiotensin converting enzyme levels and had  been on and off prednisone for nine or ten years by the time I met her.  PPD's had been negative.  She was managed on prednisone between 5 and 10 mg  every other day in 2002 and 2003.  Usually she felt clinically comfortable  and chest x-rays have remained stable showing a severe fibrocystic scarring  consistent with stage 4 sarcoid.  There was no adenopathy, effusion or other  active process.  She did have a history of steroid intolerance with mental  status change and was very interested in coming off steroids.  This was  accomplished around February 2004 and she had not been seen in my office  since that time although was scheduled for follow-up.  Pulmonary function  tests in January 2004 showed moderate restriction with a forced vital  capacity of 1900 mL and an FEV1 of 1600 mL, both about 53% of predicted.  There was slight response to bronchodilator and she had had some mild wheezy  congestion symptoms interpreted as  an asthmatic bronchitis, possibly due to  endobronchial sarcoid.  She had not had evidence of hepatic, renal or  cardiac involvement, but had been told in the past by Dr. Bing Plume that she had  evidence of ocular sarcoid.  She presented now with markedly increased  dyspnea with orthopnea for probably at least two days.  She has a non-  productive cough and subsequent brown sputum. There had not been distinct  chest pain or palpitations, but she was sitting and sleeping upright and had  noticed ankle edema.   REVIEW OF SYMPTOMS:  GENERAL:  No blood, fever, chills, rash or recognized  adenopathy.  MUSCULOSKELETAL:  No acute joint pain.  She has had shingles on  her back.   SOCIAL HISTORY:  She is a non-smoker.  She is unmarried.  She is going to  school and working as an IT consultant.  She has two children.   FAMILY HISTORY:  Positive for hypertension and cancer.   PAST MEDICAL HISTORY:  1. Recently considered to have an asthmatic bronchitis, but previously no     history of allergic rhinitis or true asthma.  2. Pulmonary sarcoidosis with history as above.  3. Shingles.  4. Tubal ligation.   PHYSICAL EXAMINATION:  VITAL SIGNS:  Blood pressure 110/70, pulse regular at  90, oxygen saturation on two liter nasal prong 98%.  GENERAL:  Alert, slender woman fully oriented.  She recognized me as I came  in.  She is wearing a wig.  ADENOPATHY:  None found at the neck, shoulders or axillae.  HEENT:  No neck vein distention or stridor.  CHEST:  Little wheeze now, bilateral crackles especially in the lung bases.  CARDIOVASCULAR:  Heart sounds are regular without murmurs, rubs or gallops.  P2 does not sound increased.  ABDOMEN:  Soft; I cannot feel the edge of the liver or spleen.  EXTREMITIES:  No edema now.  No cyanosis or clubbing.   LABORATORY DATA:  Echocardiogram shows a mildly dilated left ventricle with  decreased cardiac output, an ejection fraction of 20 to 30%, decreased  left  ventricular systolic function.   BNP was 416.  Chest x-ray shows a larger cardiac shadow and some  superimposed pulmonary vascular congestion, but otherwise background chronic  interstitial scarring is unchanged including a bleb against the lateral  right upper ribs.   IMPRESSION:  1. 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  2. Pulmonary sarcoid has not changed much by radiologic picture, stage 4.  3. Recent dyspnea appears due to cardiac status change with new evidence of     cardiomyopathy.  I would strongly consider the possibility that this is a     sarcoid cardiomyopathy.  4. Congestive heart failure component needs to be managed with the usual     technique.  5. Suggest discussion with radiology whether MRI imaging can be sufficiently     suggestive of sarcoid and would discuss with cardiology if there is a     need for endo or myocardial biopsy to clarify the diagnosis.  6. Presuming that the underlying change is primarily related to sarcoidosis,     I would recheck an ACE level and start 40 to 60 mg per day of prednisone     recognizing she is likely to need psychotropic support to tolerate this     based on past history of panic disorder and marked mood change on     steroids.  If a tissue diagnosis is otherwise felt necessary, then     consideration could be given to a minor salivary gland biopsy by ENT     looking for sarcoid granulomas, but I am not sure it would change     management at this point.  Thank you for the chance to see this patient.                                               Clinton D. Annamaria Boots, M.D.    CDY/MEDQ  D:  03/14/2003  T:  03/15/2003  Job:  [id number]   cc:   Talbert Cage, M.D.  [address] [place]  [place] [address]  Fax: [phone]

## 2010-08-24 NOTE — H&P (Signed)
[name], CREGO NO.:  [id number]   MEDICAL RECORD NO.:  [id number]          PATIENT TYPE:  INP   LOCATION:  [place]                         FACILITY:  [facility]   PHYSICIAN:  Manus Rudd, MD       DATE OF BIRTH:  November 26, 1960   DATE OF ADMISSION:  06/25/2005  DATE OF DISCHARGE:                                HISTORY & PHYSICAL   CHIEF COMPLAINT:  Blurred vision and shortness of breath.   HISTORY OF PRESENT ILLNESS:  [name] is a 50 year old female with history  of Stage IV sarcoidosis with ocular involvement and cardiac involvement who  developed nausea and vomiting and chest pain approximately 1 week ago.  She  also continued to have left lower quadrant abdominal pain.  She developed  blurred vision yesterday and has been getting progressively worse.  She has  had increasing PND and orthopnea and is now sleeping of 4 pillows when she  used to sleep on 2.  She also complains of a green and rusty-colored sputum  for approximately 1 week and has had increased shortness of breath and  increased use of her home O2.  She notes increased phlegm production and  complains of night sweats.  She has had a poor appetite and has had  significant nausea over the past week and complains of a dry mouth.   PAST MEDICAL HISTORY:  1.  Sarcoidosis.  2.  Anxiety.  3.  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  4.  CHF with ejection fraction greater than 50%.  5.  Panic attacks.  6.  GERD.  7.  Iron-deficiency anemia .  8.  Status post defibrillator.  9.  Allergic rhinitis.   PAST SURGICAL HISTORY:  1.  She has had fractured left arm and hand and a fractured left foot in      2002 with non-operative repair.  2.  Bilateral tubal ligation.   ALLERGIES:  STEROIDS cause psychosis at dosages greater than 80 mg, and  DARVOCET causes pruritic rash.   HOME MEDICATIONS:  1.  Albuterol 2 puffs q. 4 h p.r.n.  2.  Nasonex 50 mcg nasal spray each nostril daily.  3.  Calcium  carbonate plus vitamin D 3 tablets with each meal.  4.  Enalapril 5 mg p.o. daily.  5.  Lasix 20 mg p.o. daily p.r.n.  6.  Lorazepam 1 mg p.o. twice daily p.r.n. anxiety.  7.  Paxil XR 37.5 mg p.o. daily.  8.  Percocet 5/325 mg 1 to 2 tablets p.o. q. 4 h p.r.n. pain.  9.  Toprol XL 25 mg p.o. daily.   FAMILY HISTORY:  Mother has hypertension.  Both are healthy.  She is a  sibling of a total of 7.  She is married no acute distress has 2 children.  There is no family history of breast cancer or other cancers.   SOCIAL HISTORY:  She became remarried in September 2006, lives with her  husband and her daughter who was born in 50 and has one other son named  [name].  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 She has a remote tobacco  history of approximately 1 pack per day x 20 years, but she quit in 1994.  She has a remote history of alcohol abuse but quit in 1994, and she denies  any illicit drug use such as marijuana, cocaine, heroin, etc.   REVIEW OF SYSTEMS:  CONSTITUTIONAL: No previous chills, positive night  sweats.  Positive chest discomfort described as pressure but no  palpitations.  Positive shortness of breath, positive cough, positive phlegm  production. GI: Positive diarrhea and positive nausea.  GU: No dysuria.  SKIN: No rashes.  All other Review of Systems negative except for as stated  in HPI.   PHYSICAL EXAMINATION:  VITAL SIGNS: Temperature 98.1, pulse 78 to 87,  respiratory rate 14 to 20, blood pressure 137 to 165/102 to 108.  O2  originally 84% on room air.  On my exam, she was 72% on room air and then  96% on 3 liters.  GENERAL: She was mildly short of breath and mildly confused but otherwise in  no apparent distress.  HEENT:  Her head was atraumatic, and her eyes were PERRLA and EOMI.  I was  unable to clearly appreciate fundi secondary to blurry cornea.  Ears: TMs  were clear bilaterally.  Throat has no exudates and is not erythematous.  LUNGS: Diffuse  scattered rhonchi with no wheezes but fairly good air  movement.  She has good effort.  HEART: Regular rate and rhythm with no murmurs, rubs, or gallops.  ABDOMEN: Bowel sounds with tenderness to palpation in left upper quadrant  and left lower quadrant with no distention.  There is no appreciable  guarding.  EXTREMITIES:  Trace edema with 2+ pulses.  GENITAL/RECTAL:  Exams deferred.  NEUROLOGIC:  Cranial nerves II-XII grossly intact. DTRs 2+.  SKIN: No rashes.  ADENOPATHY: No submandibular or supraclavicular lymphadenopathy.   LABORATORY DATA AND TESTS:  WBC 6.4, hemoglobin 12.8, hematocrit 38, WBC  24.8.  Sodium 138, potassium 3.6, CO2 21, chloride 108, BUN 18, creatinine  1, glucose 60, calcium 8.7, AST 39, ALT 30, alkaline phosphatase 90, total  bilirubin 1.2.  PTT 21, INR 1.2, PT 15.4.  MG 45.3, 51.9, 47. CK-MB 1.2,  1.5, less than 1.  Troponin I less than 0.05, less than 0.05, less than  0.05.   EKG shows no changes from previous EKG.   CT of abdomen and pelvis have ground glass opacities in the lung bases and  questionable echogenicity to the liver suggestive of hepatitis.   Chest x-ray shows chronic pulmonary fibrosis.   ASSESSMENT AND PLAN:  This is a 50 year old patient of Dr. __________ who  presents with blurred vision and hypoxia.  1.  Hypoxia.  I will continue the patient on nasal cannula O2, culture her      sputum, start Avelox and steroids at 40 mg p.o. I will not go higher as      higher than 40 mg causes psychosis. I suspect she is having sarcoidosis      flare and thus developing hypoxia.  There is no evidence of pneumonia on      chest x-ray, and she is afebrile.  She may be having a slight congestive      heart failure flare given her mild lower extremity edema.  I will check      a BNP and consider daily Lasix.  In the meantime, we will start her      regular Lasix dose back.  She is  mildly confused secondary to hypoxia,     and she may end up needing BiPAP.   I will check an ABG, check a urine      culture, and check blood cultures.  Will check chest x-ray in the      morning and watch for pneumonia development.  Per patient's request, I      will also speak with her pulmonologist, [name].  2.  Abdominal pain.  CT of the abdomen and pelvis were within normal limits,      and I suspect that her abdominal pain is secondary to recent viral      gastroenteritis that she had this past week.  I will start back her      Protonix and continue watching her abdominal exam closely. LFTs are      within normal limits.  3.  Chest pain.  Will check cardiac enzymes x2.  Will check an EKG in the      a.m. and continue to monitor on telemetry.  She had a cardiac      catheterization and Cardiolite last performed in 2004 that showed no      ischemia or infarct but marked global left ventricular dyskinesia.  4.  Anxiety.  I will continue Paxil.  5.  Hypertension.  I will give Enalapril and beta blocker.      Manus Rudd, MD     SJ/MEDQ  D:  06/25/2005  T:  06/25/2005  Job:  [id number]

## 2010-08-24 NOTE — Discharge Summary (Signed)
NAME:  Rachel [name], Rachel [name] NO.:  [id number]   MEDICAL RECORD NO.:  [id number]          PATIENT TYPE:  IPS   LOCATION:  0404                          FACILITY:  [facility]   PHYSICIAN:  Stark Jock, M.D. DATE OF BIRTH:  October 16, 1960   DATE OF ADMISSION:  02/17/2006  DATE OF DISCHARGE:  02/24/2006                               DISCHARGE SUMMARY   IDENTIFICATION:  A 50 year old married African American female who was  admitted on an involuntary basis on February 16, 2006.   HISTORY OF PRESENT ILLNESS:  The patient has a history of psychosis.  She was transferred from [facility] after being medically stabilized  for hyponatremia.  She has been exhibiting psychotic, problematic  behaviors.  She was on all fours on admission, barking like a dog.  She  was aggressive and required sedation.  It in main hospital, she had been  noted to be speaking in strange tongues and repeating comments people  were saying.  She also had visual hallucinations.  The patient was here  in October 2007.   The patient is currently on Risperdal 0.25 mg daily.   She has sarcoidosis, congestive heart failure, hypertension and allergic  rhinitis and GERD.   PHYSICAL EXAM AND LABORATORY FINDINGS:  The patient was noted to be a  middle-aged female who had decreased O2.  She was placed on O2 2 L.  There were no other acute medical problems.  CBC was within normal  limits.  CMET within normal limits.  UDS negative.  Potassium was 4.1.  UPT negative.  Alcohol level less than 5.  UA:  Large blood.   HOSPITAL COURSE:  Upon admission the patient was placed on Os-Cal p.o.  t.i.d. with meals, carvedilol 6.25 mg p.o. b.i.d., enalapril 2.5 mg p.o.  daily, Lasix 40 mg p.o. b.i.d., Nasonex 2 sprays in each nostril per  day, Nexium 40 mg p.o. daily, Risperdal 0.25 mg p.o. daily, Lovenox 40  mg subcu daily, Lovenox 40 mg p.o. daily, Haldol 2 mg one to two hours  p.r.n. severe agitation, Tylenol 650 mg p.o.  q.6-8h. p.r.n. headache,  albuterol 2 puffs q.4h. p.r.n., Ativan 1 mg p.o. b.i.d. p.r.n.  On  November 12, 207, a pulse ox each shift was ordered.  The patient was  placed on one-to-ones due to her confusion and fall risk.  On February 17, 2006, Lovenox 40 mg p.o. was discontinued.  An order was written not  to admit a roommate to her room.  On February 17, 2006, a neurology  consult was ordered.  This was done by Dr. Estella Husk.  His impression was  that this is a 50 year old with increased psychosis with questionable  urinary tract infection.  He had a history of psychosis and a  questionable infection, now worsening with psychosis.  He advised  repeating urinalysis and getting a urine culture for further evaluation.  Her CT scan was reviewed, which was unremarkable.  The patient could not  get an MRI due to a defibrillator placement.  He recommended an EEG to  rule out any seizure  activity that was causing increased psychosis and  combative behavior.  He also recommended considering a CT of the head as  the patient cannot get an MRI scan.  He planned to check the B12, B1,  TSH, RPR, ESR and folate to rule out other etiologies for her altered  mental status.  He would recommend continuing the patient's current plan  of treatment.  He planned to follow the patient while she was in the  hospital.  On February 17, 2006, the patient was to have O2 via nasal  cannula running at 2 L/min. Continuously.  On February 17, 2006, a  routine EEG was ordered, as were all of these labs ordered by Dr.  Estella Husk.  On February 18, 2006, Risperdal was increased to 1.5 mg p.o.  q.h.s. and Risperdal was started at 0.25 mg p.o. b.i.d.  On February 18, 2006, due to nausea, the patient was started on Phenergan 25 mg IM or  p.o. q.6h. p.r.n.  The patient had to continue one-to-one observation  due to her confusion and high fall risk.  The patient had a repeat  urinalysis but was placed on Levaquin 500 mg p.o.  daily x7 days for  possible UTI.  On November 14 and November 15 the patient continued to  be placed on one-to-one.  On February 20, 2006, due to the patient  getting up and walking now, her Lovenox was discontinued, and on  February 19, 2006, she was placed on O2 4 L per nasal cannula.  On  February 12, 2006, Risperdal was increased to 2 mg p.o. q.h.s.  The  patient tolerated her medications well.  She had no significant side  effects except for sedation.  Upon first meeting the patient, she was  sitting in a room with her one-to-one.  She was somewhat stilted in her  presentation.  She gazed past me and continued repeat that she was doing  well.  The neurologist had seen her as indicated above.  The labs  ordered included an ESR, which was slightly high at 50 (0-22).  Routine  chemistry panel was revealed slightly decreased potassium of 3.4 (3.5-  5.1).  TSH of 0.696 (0.35-5.5).  B12 was 381.  Serum folate 15.6.  This  was normal.  Vitamin B1 was 0.3, which was normal.  A repeat urinalysis  showed few bacteria and 7-10 wbc's.  The urine culture showed multiple  bacteria morphotypes present, none predominant.  They suggested re-  collection if clinically indicated.  However, the patient was already  started on Levaquin to treat this.  RPR was nonreactive.  On February 19, 2006, the patient continued to be disorganized.  She did sleep well.  She was moaning as of hurting, closing her eyes.  She states she does  not know what is going on.  Her speech was soft and slow.  On February 20, 2006, the patient stated she felt a lot better.  She had good eye  contact.  She was verbal and cooperative.  She feels her sleep is  better.  She talked about her 33-year-old granddaughter, whom she keeps  on the weekends.  She is very devoted to her.  She was able to walk  around with an O2 tank.  She was tolerating Risperdal without side effects.  She was much clearer cognitively.  The one-to-one  precautions  were discontinued.  On February 21, 2006, the patient continued to show  improvement.  She discussed moving to a  new apartment.  She was looking  forward to this.  She stated she needed this for health reasons.  Her  appetite was picking up.  She states she slept well.  On November 17 and  18, 2007, the patient continued to show improvement with no significant  change in mental status.  On February 24, 2006, the patient the patient  states she slept well all night.  Her son visited this weekend and this  went well.  She talked about her new apartment.  She also saw her  granddaughter, which went well.  She discussed her worries about her 21-  year-old daughter, who has ADHD.  The patient was much clear and her  mood was euthymic, affect wide-range.  No suicidal or homicidal  ideation.  No thoughts of self-injurious behavior.  No auditory or  visual hallucinations.  No paranoia or delusions.  Thoughts were logical  and goal-directed.  Thought content:  No predominant theme.  The patient  was able to be discharged today as she was much more stable and had good  family support.   DISCHARGE DIAGNOSES:  Axis I:  Psychotic disorder, not otherwise  specified.  Axis II:  None.  Axis III:  Sarcoidosis, congestive heart failure, hypertension, allergic  rhinitis, gastroesophageal reflux disease.  Axis IV:  Moderate (problems with primary support group, other  psychosocial problems- marked psychiatric illness, medical problems).  Axis V:  GAF upon discharge was 50.  GAF upon admission was 20.  GAF  highest past year was 65-70.   DISCHARGE/PLAN:  There were no specific dietary restrictions.  Activity  level will need to be increased slowly and as advised by her primary  care physician.   DISCHARGE MEDICATIONS:  1. Risperdal 0.25 mg twice daily.  2. Risperdal 2 mg at bedtime.  3. Cipro 500 mg twice a day, last dose on February 25, 2006.  4. Coreg 6.25 mg twice a day.  5. Vasotec  2.5 mg daily.  6. Lasix 40 mg daily.  7. Nasonex nasal spray 2 sprays daily.  8. Nexium 40 mg daily.  9. Calcium carbonate 500 mg three times daily.   POST HOSPITAL CARE PLANS:  The patient was scheduled to see Chucky May, M.D., on January 9 at 2 p.m.      Stark Jock, M.D.  Electronically Signed     BHS/MEDQ  D:  04/17/2006  T:  04/18/2006  Job:  [id number]

## 2010-08-24 NOTE — Assessment & Plan Note (Signed)
[facility]                               PULMONARY OFFICE NOTE   HAELEE, BOLEN                         MRN:          [id number]  DATE:11/18/2005                            DOB:          08-21-1960    PROBLEM:  1. Pulmonary sarcoidosis, stage IV.  2. Cardiomyopathy.  3. History of ventricular tachycardia, automatic implanted cardioverter      defibrillator.  4. Class II heart failure, ejection fraction greater than 50%.  5. Allergic rhinitis.  6. Anxiety/panic attack.  7. Iron-deficiency anemia.   HISTORY:  She was last here in April and comes now for scheduled followup,  saying that for two weeks she has been miserable, hurting all over with some  cough and white phlegm, increasing shortness of breath, chilliness, and  nausea.  She complains that her body feels swollen and tender.  She is about  to go to [facility] in a couple of days for GI followup, but she is not  sure why she is supposed to go.  She has been taking Lasix 20 mg a day but  says it nauseates her.   MEDICATIONS:  1. Flucticazone nasal spray.  2. Oxygen at 4 L.  3. Enalapril 5 mg.  4. Lasix 20 mg.  5. Paxil XR 37.5 mg.  6. K-Dur 20 mEq.  7. Metoprolol XL 25 mg.  8. Protonix 40 mg.  9. Albuterol inhaler.  10.Phenergan.  11.Tylenol #3.  12.Darvocet.   OBJECTIVE:  VITAL SIGNS:  Weight 183 pounds which is just about the same as  April 12, when she was last here.  GENERAL:  She started out tearful and  crying but then stopped as we talked.  HEENT:  Neck veins were full without stridor.  Pharynx was clear.  Nasal  airway, ears, and eyes were unremarkable.  CHEST:  Quiet breath sounds without rales, rhonchi, or wheeze but breath  sounds were shallow.  She was not coughing.  She was quite tender just below  the right anterior costal margin.  I did not press hard enough to be sure  that this was liver edge, but it would be consistent with a stretched liver  capsule.   EXTREMITIES:  There was trace edema in the feet without cyanosis.   LABORATORY:  Blood work was done as she left and is available now at  discharge.  Significant for normal white count of 7900, hemoglobin  borderline low at 11.8.  Chemistry significant for a carbon dioxide of 34,  suggesting some CO2 retention.  Total bilirubin 1.5, alkaline phosphatase  132, albumin 3.1.  B natriuretic peptide was 861.  She was wearing oxygen at  4 L with room air saturation 89%.  Chest x-ray:  Little change today  compared from film from about three years ago available here today showing  cardiomegaly, diffuse scarring, but no acute process.  There was an  implanted defibrillator in the left chest.   IMPRESSION:  1. Sarcoid stage IV with old scarring, clinically stable.  2. Dilated cardiomyopathy, probably with increased fluid retention.  Discomfort due to stretched liver capsule and increased dyspnea without      frank heart failure but with B natriuretic peptide 861.   PLAN:  1. She is going to take Zaroxolyn 2.5 mg daily times two days while      continuing potassium and then drop back to her Lasix 20 mg daily, at      which time she will be working to make followup with her primary      physicians and cardiologist.  2. Blood for ACE level, in addition to above.  3. Depo-Medrol 80 mg IM.  4. Schedule return to me in four months but earlier p.r.n.                                   Clinton D. Annamaria Boots, MD, FCCP, FACP   CDY/MedQ  DD:  11/18/2005  DT:  11/19/2005  Job #:  [id number]   cc:   Champ Mungo. Lovena Le, [place]

## 2010-08-24 NOTE — Consult Note (Signed)
NAME:  Rachel [name], Rachel [name] NO.:  [id number]   MEDICAL RECORD NO.:  [id number]          PATIENT TYPE:  INP   LOCATION:  4702                         FACILITY:  [facility]   PHYSICIAN:  Fransico Him, M.D.     DATE OF BIRTH:  Jan 13, 1961   DATE OF CONSULTATION:  01/24/2005  DATE OF DISCHARGE:                                   CONSULTATION   CHIEF COMPLAINT:  Chest pain, EKG changes, and elevated troponin.   HISTORY OF PRESENT ILLNESS:  This is a 50 year old female with a known  dilated cardiomyopathy secondary to sarcoidosis, pulmonary sarcoidosis with  pulmonary fibrosis. She also has a history of ventricular tachycardia,  status post [facility] implantation, who was admitted on October 16th for chest  pain, fevers, hemoptysis, and subsequently was diagnosed with acute  tracheobronchitis and was seen in consultation by pulmonary and diagnosed  with pneumonia. She is currently antibiotics, tapering steroids, and  nebulizer treatments. This morning the patient was in the process of  receiving a nebulizer treatment when she developed paroxysmal coughing and  severe left breast pain radiating into her back. Blood pressure was  elevated. She was given morphine and nitroglycerin. She did not have any  acute shortness of breath or diaphoresis at the time. EKG reportedly showed  T-wave inversions in aVR, aVL, and V1 through V3.  Cardiac panel was  negative. Since the patient has a history of dilated cardiomyopathy and CHF,  cardiac panels have been done on admission with troponin was elevated at 0.1  to 0.15 and it was felt to be most likely secondary to COPD and acute  hypoxia with RV strain. Lasix recently discontinued by primary PCP secondary  to decreased blood pressure. On admission, BNP was elevated to 400, although  the chest x-ray and chest CT did not show any evidence of pulmonary edema.   SOCIAL HISTORY:  She is recently married. She denies any tobacco or alcohol  use. She is a  primary caretaker for her children. She works as an  Agricultural engineer.   FAMILY HISTORY:  Mother is 34 with diabetes, heart failure, and  hypertension. Her father died at 92 of lung CA. She has one sister with  lupus and hypertension.   ALLERGIES:  HIGH DOSE STEROIDS, she develops psychosis from it and DARVOCET  causes itching and rash.   HOME MEDICATIONS:  1.  Albuterol MDI two puffs q.4h.  2.  Enalapril 5 mg daily.  3.  Flonase one spray per each nostril b.i.d.  4.  Calcium carbonate plus D t.i.d. with meals.  5.  Lorazepam 1 mg bid p.r.n. anxiety.  6.  Paxil 20 mg daily.  7.  Percocet p.r.n.  8.  Toprol XL 25 mg daily.  9.  Protonix 40 mg daily.   INPATIENT MEDICATIONS:  The same as above including Mucinex 600 mg b.i.d.,  prednisone taper, maxipime which was discontinued today, and ceftazidime 1  gm IV q.8h.   PAST MEDICAL HISTORY:  1.  Stage IV sarcoidosis.  2.  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  3.  History of CHF.  4.  Ventricular tachycardia, status post AIC implantation.  5.  Anxiety and depression.  6.  Gastroesophageal reflux disease.  7.  Steroid psychosis.  8.  Panic attacks.   REVIEW OF SYSTEMS:  Other than what is stated in HPI includes recent chest  pain with subjective fevers, shortness of breath, malaise. Apparently the  patient has been having chest pain associated with deep breathing and also  associated with coughing. There has been on gastroesophageal reflux  symptoms. She has had a mild decrease in appetite. She has also had mild URI  symptoms for several months, progressively worsening. She felt that they  might be due to allergy or viral. She denies any lower extremity edema or  palpitations.   PHYSICAL EXAMINATION:  VITAL SIGNS: Her blood pressure is 102/73, pulse 80,  afebrile, respirations 20.  GENERAL: A well-developed, well-nourished female in no acute distress.  HEENT: Benign.  NECK: Supple without lymphadenopathy.  Carotid upstrokes are +2 bilaterally  with no bruits.  LUNGS: Clear to auscultation throughout.  HEART: Regular rate and rhythm. No murmurs, rubs, or gallops. Normal S1 and  S2.  CHEST: Her chest wall is tender to palpation over the left anterior and  lateral chest wall and completely reproduces her pain that she has been  experiencing.  ABDOMEN: Soft, nontender, nondistended. Active bowel sounds. No  hepatosplenomegaly.  EXTREMITIES: No clubbing, cyanosis, erythema, or edema. 2+ posterior tibial  pulses bilaterally.   LABORATORY DATA:  Sodium 139, potassium 3.7, chloride 102, bicarbonate 32,  BUN 11, creatinine 11, glucose 113, BNP 437. Her sputum for AFB is pending.  CPKs are 48, 48, 51, 50, 44, and 29. CPK-MB 1.3, 1.5, 2.3, 2.8, 2.1, and  1.1. Troponin-I 0.02, 0.01, 0.11, 0.15, 0.10, and 0.07. White blood cell  count 7, hemoglobin 9.9, hematocrit 30, platelet count 283,000.   A 2-D echocardiogram done on January 23, 2005, shows normal left ventricular  systolic function with an EF of 55% to 65%. Echocardiogram done in 2004  showed an EF of 20% to 30%. Chest x-ray done today showed chronic fibrotic  changes in the lungs, no CHF. Chest CT done on October 16th showed severe  interstitial lung disease of the upper lobes, right greater than left, no  edema. Initial EKG done on October 16th showed sinus rhythm with nonspecific  T-wave abnormality. EKG done on October 17th showed T-wave inversions in V1  through V3 which are identical to the T-wave inversions noted on EKG in  September 2006.  Of note there seemed to be pseudonormalization of the T-  waves in these leads on October 16th, but they were back to their baseline  on October 16th. Today, October 19th, during chest pain, EKG showed sinus  rhythm with a PVC, there was slight T-wave inversions in V1 and V2.   ASSESSMENT:  1.  Atypical chest pain. Most likely not consistent with an underlying     cardiac etiology. Her EKG changes  with T-wave inversions in V1 through      V3 were present on EKG back in 2005. She did an EKG on the 16th which      showed a nonspecific T-wave abnormality and the T-waves appeared more      deeply inverted on the 17th than on today's, but they are identical to      the EKG a year ago. Troponin is slightly elevated, but the CPK and MB      are  normal and most likely the elevation in the troponin  is due to      acute hypoxia with pulmonary hypertension and RV strain with leak of      cardiac enzyme. I do not think this is due to cardiac ischemia. Her      chest pain is very atypical and completely reproducible with palpation      over her left side of her chest and she also has a pleuritic component      to her chest pain.  I think this is due to a combination of      musculoskeletal etiology as well as a primary pulmonary process and      would recommend no further cardiac workup at this time.  2.  History of ventricular tachycardia, status post automatic implantable      cardiac defibrillator. No further arrhythmias.  3.  Stage IV sarcoidosis.  4.  Tracheobronchitis pneumonia and pulmonary fibrosis.   PLAN:  Again, no further cardiac workup at this time. Would continue current  medications.      Fransico Him, M.D.  Electronically Signed     TT/MEDQ  D:  01/24/2005  T:  01/24/2005  Job:  [id number]

## 2010-08-24 NOTE — H&P (Signed)
NAME:  Rachel [name], CORRIGAN NO.:  [id number]   MEDICAL RECORD NO.:  [id number]          PATIENT TYPE:  INP   LOCATION:  5409                         FACILITY:  [facility]   PHYSICIAN:  Arnette Norris, M.D.       DATE OF BIRTH:  [birth year]   DATE OF ADMISSION:  12/26/2005  DATE OF DISCHARGE:                                HISTORY & PHYSICAL   CHIEF COMPLAINT HPI:  Ms. Arvin is a 50 year old female who was recently  discharged from [facility] on December 15, 2005, for atypical chest  pain and a CHF exacerbation who has a pretty complicated medical history  with Stage IV sarcoidosis, dilated cardiomyopathy, somatoform disorder and  chronic pain who was transferred today from [facility] to our hospital.  Patient was taken to [facility] by EMS this afternoon after home health  nurse felt that she was not acting right.  At [facility], the physicians  there felt that she had psychotic delusions and found that she was  hypokalemic with a potassium of 2.9.  She was then transferred to [facility], she complained of hearing a voice in her right ear.  She  denies that the voice told her to do anything to herself or to anyone else.  She also complains of right breast pain and reports that she has been  pregnant for 9 months.  She seemed to have no flight of ideas or ideas of  reference.   REVIEW OF SYSTEMS:  Positive only for tenderness over right breast.   MEDICATIONS:  1. Elavil 50 mg p.o. q.h.s.  2. Lasix 40 mg p.o. b.i.d.  3. Nexium 40 mg p.o. daily.  4. Paxil 50 mg p.o. daily.  5. Potassium chloride 8 mEq daily.  6. Ativan 1 mg b.i.d. p.r.n.  7. Coreg 3.125 mg p.o. b.i.d.  8. Enalapril 2.5 mg p.o. b.i.d.  9. Albuterol inhaler 2 puffs q.4h. p.r.n.   PAST MEDICAL HISTORY:  1. History of chest pain near her defibrillator site.  2. 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  3. 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  4. Nonsustained ventricular tachycardia with syncope.  5. She has a history of hospitalized for psychosis.  6. GERD.  7. Panic attacks.   ALLERGIES:  1. STEROIDS CAUSE PSYCHOSIS.  2. DARVOCET CAUSES A PRURITIC RASH.   FAMILY HISTORY:  Her mother has borderline diabetes, hypertension and  congestive heart failure.  Her father died in his 39's due to lung cancer.  She has no family history of breast cancer.   SOCIAL HISTORY:  Patient was remarried in September of last year.  She lives  with her husband and daughter.  She also has an older son who does not live  with her.  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  She has a remote history of tobacco use 1 pack per day x20 years, she quit  in 1994.  She also has a remote history of alcohol  abuse which she also quit  in 1994.   PHYSICAL EXAM:  The only vital sign I had at admission was her blood  pressure was 156/100 and that she is sating 100% on 4L nasal cannula.  GENERAL APPEARANCE:  Patient was alert, in no acute distress.  MENTAL STATUS:  Patient was altered.  She had loose associations, she was  very tangential with flight of ideas.  She was having auditory  hallucinations while we were examining her.  She had no ideas of reference.  CHEST:  She is tender to palpation under her left breast.  LUNGS:  Decreased respiratory effort, slight crackles in her left lower lung  base.  HEART:  Regular rate and rhythm.  ABDOMEN:  Soft, nontender, positive bowel sounds.  EXTREMITIES:  No edema, pulses 2+.  NEUROLOGICAL:  Cranial nerves II-XII intact, 5/5 strength bilaterally in  upper and lower extremities.   LABS TAKEN AT [place]:  Her BNP was 227, but was 227 on September 4 as  well.  Her UA showed a few bacteria, 15 ketones, small bili, trace  leukocytes.  Her UDS was positive for benzodiazepines but that was after she  was given Ativan in the [facility] ED.  Ethanol was less than 5.   Her white  count was 5.4, hemoglobin was 12.5, hematocrit 37.5, her platelets were 231.  Sodium was 139, potassium was 2.9, chloride was 102, CO2 was 29, BUN was 10,  creatinine was 0.8, platelets were 81.   ASSESSMENT AND PLAN:  A 50 year old female with stage IV sarcoid who now has  a 4 liter home oxygen requirement and a defibrillator secondary to her  cardiomyopathy, somatization disorder and borderline personality, who was  transferred from [place] with psychotic delusions and hypokalemia.  1. Psychotic delusions.  Will attempt to calm patient down tonight with      Haldol 5 mg orally twice daily and Ativan 2 mg IV every 4 as needed      agitation.  Will consult Psychiatry in the morning.  Will continue home      medications of Paxil, Elavil.  We believe at this time that there is no      organic cause for her psychosis.  2. Hypokalemia.  Will replete potassium tonight with 8 mEq of Kay Ciel.      Will check BMET and magnesium in the morning.  3. Sarcoid.  Patient is on home oxygen.  Will continue her regimen of      albuterol metered dose inhaler.  Will not give steroids as it causes      psychosis in this patient.  BNP is elevated at 227, but stable from      last admission.  4. Congestive heart failure.  Continue her regimen of Coreg 3.125 twice      daily and Enalapril 2.5 mg twice daily.  Will continue to monitor      electrolytes and blood pressure.  5. Fluid, electrolytes and nutrition, gastrointestinal.  Heart healthy      diet, saline lock IV.  6. Prophylaxis.  Protonix and Lovenox.           ______________________________  Arnette Norris, M.D.     TA/MEDQ  D:  12/27/2005  T:  12/29/2005  Job:  [id number]

## 2010-08-24 NOTE — H&P (Signed)
NAMEEMBERLEY, KRAL NO.:  [id number]   MEDICAL RECORD NO.:  [id number]          PATIENT TYPE:  INP   LOCATION:  5409                         FACILITY:  [facility]   PHYSICIAN:  Starleen Blue, M.D.DATE OF BIRTH:  1960/07/26   DATE OF ADMISSION:  08/13/2005  DATE OF DISCHARGE:                                HISTORY & PHYSICAL   CHIEF COMPLAINT:  Chest pain.   HISTORY OF PRESENT ILLNESS:  Ms. Eagles is a 50 year old female presenting to  [facility] from pulmonary rehab for acute chest pain x1  and a half days.  This chest pain initially started at home during dinner  last evening located at left chest, then went to mid sternum with radiation  to neck.  The pain was described as sharp in nature; however, the patient  felt a burning sensation in her throat.  She did not try anything to relieve  the pain.  She is compliant with her daily Protonix 40 mg daily.  She says  it was hard to swallow foods last night, it felt like my food was getting  stuck in my neck.  However, she is tolerating liquids without difficulty.  She did have some nausea with this initial chest pain, no vomiting, she was  diaphoretic with pain onset.  The pain occurred with increased intensity  today during pulmonary rehab session.  She was sent directly to the  emergency department to be evaluated.  The patient remains on 4 liters of O2  (which is her baseline as an outpatient), with O2 saturations greater than  90%.  Chest pain was initially relieved when she presented to the ED with a  nitroglycerin sublingual tablet while in the emergency department.   REVIEW OF SYSTEMS:  Significant for 30-pound weight gain over the last  month, four to five pillow orthopnea, occasional PND nightly, increased  shortness of breath walking stairs, dark stools, increased lower extremity  edema, hot flashes x1 month, left third and fourth digit numbness at upper  extremity, pain  radiating from left neck.   PAST MEDICAL HISTORY:  1.  Sarcoidosis.  2.  Anxiety.  3.  Herpes zoster.  4.  Cardiomyopathy secondary to sarcoidosis.  5.  CHF with ejection fraction 55 to 65% in October of 2006, by two-D      echocardiogram.  6.  Panic attacks.  7.  Gastroesophageal reflux.  8.  Allergic rhinitis.  9.  Premenopausal.  10. 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  11. Nonsustained V-tach and syncope, status post AICD.  12. Hospitalized for steroid psychosis in 1996.   MEDICATIONS:  1.  Albuterol two puffs q.4h p.r.n.  2.  Calcium carbonate plus Vitamin D t.i.d. with meals.  3.  Enalapril 5 mg daily.  4.  Ferrous fumarate 200 mg daily.  5.  Lasix 20 mg daily.  6.  Lorazepam 1 mg p.o. b.i.d. p.r.n. anxiety.  7.  Nasonex 50 mcg two nasal sprays in each nostril daily.  8.  Paxil 37.5-XR mg p.o. daily.  9.  Percocet 5/325 mg one  to two tabs p.o. q.4h p.r.n.  10. Protonix 40 mg p.o. daily.  11. Toprol-XL 25 mg p.o. daily.   ALLERGIES:  1.  PREDNISONE greater than 80 mg causes psychosis.  2.  DARVOCET causes pruritic rash.   FAMILY HISTORY:  Mother has borderline diabetes and hypertension.  Father is  described as healthy.  No family history of breast cancer or other cancers.   SOCIAL HISTORY:  The patient remarried in September of 2006 and lives with  husband and daughter.  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  She has a remote history of  tobacco abuse, one pack per day x20 years.  She quit in 1994.  Also has a  remote history of alcohol abuse, quit drinking in 1994.   PHYSICAL EXAMINATION:  VITAL SIGNS:  Temperature 98.5, pulse 82, respiration  rate 22, blood pressure 138/87, O2 saturation 98% on 2 liters.  GENERAL APPEARANCE:  No apparent distress, well appearing.  MENTAL STATUS:  Alert and oriented x3.  HEAD:  Atraumatic, normocephalic.  EYES:  PERRL, EOMI.  EARS:  Clear.  TMs  without fluid or drainage.  No canal erythema.  MOUTH AND THROAT:  Oropharynx clear and moist.  No lesions.  NECK:  No thyromegaly, no lymphadenopathy.  CHEST AND BREASTS:  Tender to palpation two fingerbreadth's below left  breast.  LUNGS:  Crackles bilaterally lower lung bases, left greater than right.  HEART:  Regular rate and rhythm, no JVD.  ABDOMEN:  Soft, nontender, nondistended, could not palpate liver edge.  EXTREMITIES:  5 out of 5 muscle strength, no pitting edema.  However, she  does have bilateral nonpitting edema.  Pulses equal bilaterally at PDs.  NEURO:  Cranial nerves II-XII grossly intact.  Sensation also intact.  SKIN:  Without lesions.   LABORATORY DATA AND TESTS:  An i-STAT-8:  pH 7.352, sodium 139, potassium  3.7, chloride 104, bicarb 29.7, BUN 10, glucose 15, hemoglobin 13.9,  hematocrit 41.0, PTT 14.6, INR 1.1, myoglobin first set 42.1, CK-MB first  set less than 1.0, troponin less than 0.05.  Second set of point-of-care  enzymes:  Myoglobin 34.9, CK-MB less than 1.0, troponin less than 0.05.  EKG:  No changes compared to previous.  Chest x-ray:  Cardiac enlargement  and AICD in place, chronic interstitial disease, no definite acute process  of CHF.  CT chest:  No pulmonary embolus, marked pulmonary interstitial  fibrosis compatible with chronic sarcoidosis, PAH, and large right cardiac  chambers and vena cava compatible with increased right heart pressures  probably an element of CHF, mosaic lung attenuation, edema plus or minus  alveolitis, and mediastinal adenopathy.   ASSESSMENT AND PLAN:  1.  Chest pain:  The patient has several medical conditions that could cause      this pain, which sounds atypical in nature.  Differential includes chest      pain could be secondary to pulmonary sarcoidosis, congestive heart      failure exacerbation, gastroesophageal reflux as well as subacute     cervical radiculopathy.  Therefore, we will rule out cardiac etiology       with serial cardiac enzymes, BNP, and continue with monitoring.      Continue current home dose of Lasix 20 mg for now but consider      increasing dose if BNP is elevated.  The patient currently has no      increased need of oxygen,  and her chest pain is not pleuritic, and      therefore I do not think this is related to her pulmonary sarcoidosis.      Therefore, for now I will hold starting the patient on steroid therapy.      There was no pulmonary embolus per CT of chest; therefore, this etiology      has been ruled out.  Will increase home Protonix to b.i.d. dosing for a      possible gastroesophageal reflux disease exacerbation.  I will also get      a cervical spine film to rule out radiculopathy at cervical spine that      could possibly be radiating to chest and causing a combination of chest      pain as well as left hand numbness and neck pain.  2.  Sarcoid:  Will monitor O2 need, no steroids as discussed above in number      one.  3.  Congestive heart failure:  Possible congestive heart failure      exacerbation.  Will await BNP as well as continue to cycle cardiac      enzymes x3 to rule out acute event.  We will repeat electrocardiogram in      the morning.  May consider increasing Lasix pending BNP.  4.  Gastroesophageal reflux disease:  Increase Protonix to b.i.d. dosing.  5.  Panic attacks/anxiety:  Continue Paxil.      Starleen Blue, M.D.     VRE/MEDQ  D:  08/13/2005  T:  08/13/2005  Job:  [id number]   cc:   Sheron Nightingale ?????, M.D.  [place]

## 2010-08-24 NOTE — Assessment & Plan Note (Signed)
[facility]                               PULMONARY OFFICE NOTE   Rachel [name], Rachel [name]                         MRN:          [id number]  DATE:11/18/2005                            DOB:          06-Apr-1961    PROBLEMS:  1. Sarcoid, stage IV   Cancelled by dictator                                   Clinton D. Annamaria Boots, MD, [facility], FACP   CDY/MedQ  DD:  11/21/2005  DT:  11/21/2005  Job #:  [id number]   cc:   [facility]

## 2010-08-24 NOTE — Discharge Summary (Signed)
Rachel [name], GASS NO.:  [id number]   MEDICAL RECORD NO.:  [id number]          PATIENT TYPE:  INP   LOCATION:  4702                         FACILITY:  [facility]   PHYSICIAN:  Verner Chol, MD DATE OF BIRTH:  1961-01-06   DATE OF ADMISSION:  01/21/2005  DATE OF DISCHARGE:  01/27/2005                                 DISCHARGE SUMMARY   DISCHARGE DIAGNOSES:  1.  Acute bronchitis/bronchopneumonia.  2.  Stage IV sarcoidosis.  3.  Congestive heart failure.  4.  Anxiety/depression.  5.  Gastroesophageal reflux disease.   DISCHARGE MEDICATIONS:  1.  Albuterol nebulizer 2.5 mg q.4h. p.r.n.  2.  Paxil 20 mg p.o. daily.  3.  Enalapril 5 mg p.o. daily.  4.  Nasonex nasal spray b.i.d.  5.  Calcium carbonate 500 mg t.i.d.  6.  Toprol XL 12.5 mg p.o. daily.  7.  Avelox 400 mg p.o. daily times 11 days.  8.  Prednisone 10 mg b.i.d.  9.  Ativan 1 mg b.i.d. p.r.n. anxiety.  10. Percocet 5/325 mg one tablet p.o. q.4h.  11. Iron 325 mg one tablet b.i.d.  12. Promethazine one tablet q.4h. p.r.n. nausea.   BRIEF HISTORY:  This is a 50 year old African American female with end-stage  pulmonary sarcoidosis who presented to the emergency room with three days of  hemoptysis, malaise, and shortness of breath. On examination she had  scattered crackles throughout her lung fields. Differential for her included  sarcoidosis exacerbation, pulmonary embolism, and pneumonia.   HOSPITAL COURSE:   PROBLEM #1:  Acute bronchitis/bronchopneumonia. Due to her symptoms and  underlying illness we decided to start treating her for pneumonia per the  recommendations of pulmonology. She was initially on IV cefepime for about  two or three days, and then transitioned to p.o. Avelox. We also started the  patient on a steroid taper due to her underlying sarcoidosis. We also had  the patient on albuterol nebulizer treatments that she received about every  four hours in the hospital which  seemed to help her subjectively. The  patient was discharged and was continued on low dose steroid until she  followed with her pulmonologist, [name]. Her symptoms had improved  dramatically throughout the course and when she was discharged she was no  longer having hemoptysis and she was on her baseline level of oxygen  requirement, which is two liters.   PROBLEM #2:  Stage IV sarcoidosis. The patient had advanced interstitial  lung disease by chest x-ray and CT. She seemed to be helped by the  prednisone at low doses as well as albuterol nebulizer treatment and Nasonex  nasal spray.   PROBLEM #3:  CHF. The patient was euvolemic throughout her hospitalization  here and was discharged home on enalapril 5 mg daily here.   PROBLEM #4:  Anemia. The patient's hemoglobin was slightly low, but remained  around 10. Throughout hospitalization her studies were consistent with iron  deficient, so at discharge we started her on an iron supplement.   PROBLEM #5:  Anxiety/depression. The patient remained stable throughout her  hospitalization here on  Paxil and remained on her home doses.   The patient was discharged home in good condition. Follow-up was arranged  with her pulmonologist as well as her primary care physician, Randa Spike, M.D.   STUDIES:  Echo showed an EF of 55% to 65%, however it was a limited view and  poor study. Chest x-ray negative for an acute PE. Severe interstitial lung  disease.   ADMISSION LABORATORY DATA:  Brain natriuretic peptide was 436.  Respiratory  culture showed normal O/P flora. Discharge hemoglobin was 10.4, creatinine  0.9, transferrin 331, iron 27, and percent saturation 8.   FOLLOWUP:  She is to take all medications as prescribed.      Kaylyn Layer, M.D.    ______________________________  Verner Chol, MD    KS/MEDQ  D:  01/29/2005  T:  01/29/2005  Job:  [id number]   cc:   Randa Spike, MD  Fax: [phone]

## 2010-08-24 NOTE — Op Note (Signed)
NAME:  Rachel [name], Rachel [name]                      ACCOUNT NO.:  [id number]   MEDICAL RECORD NO.:  [id number]                   PATIENT TYPE:  INP   LOCATION:  3740                                 FACILITY:  [facility]   PHYSICIAN:  Champ Mungo. Lovena Le, M.D.               DATE OF BIRTH:  1960-12-29   DATE OF PROCEDURE:  03/28/2003  DATE OF DISCHARGE:                                 OPERATIVE REPORT   PROCEDURE PERFORMED:  Insertion of a single chamber implantable cardioverter-  defibrillator.   INDICATIONS FOR PROCEDURE:  Sarcoidosis cardiomyopathy with syncope and  nonsustained ventricular tachycardia with an ejection fraction of 20%.   INTRODUCTION:  The patient is a 50 year old woman with a history of severe  end stage sarcoidosis.  She has cardiomyopathy consistent with sarcoid  cardiomyopathy with an ejection fraction of 20%.  She has nonsustained  ventricular tachycardia.  She has syncope of unexplained etiology in the  setting of all of the above.  She has class 2 to 3 heart failure.  She is  now referred for implantable cardioverter-defibrillator insertion.   DESCRIPTION OF PROCEDURE:  After informed consent was obtained, the patient  was taken to the diagnostic electrophysiology laboratory in fasted state.  After the usual prepping and draping, intravenous fentanyl and Midazolam was  given for sedation.  A total of 30 mL of lidocaine was infiltrated into the  left infraclavicular region.  A 9 cm incision was carried out over this  region and electrocautery utilized to dissect down to the fascial plane.  10  mL of contrast injected into the left upper extremity.  Venous system  demonstrated a patent left subclavian vein.  It was subsequently punctured  and the St. Jude model 1580, 65 cm defibrillation lead, serial number  [id number] was advanced into the right ventricle.  R waves measured 14 mV and a  pacing threshold of 0.6 V at 0.5 msec with the lead actively fixed.  The  pacing  impedance was 859 ohms.  At the lead was actively fixed.  10 V pacing  did not stimulate the diaphragm.  With the lead in satisfactory position, it  was secured to the subpectoralis fascia with a figure-of-eight silk suture.  In addition, the sewing sleeve was secured with silk suture.  Electrocautery  was then utilized to make a subcutaneous pocket.  Kanamycin irrigation was  utilized to irrigate the pockets and electrocautery utilized to ensure  hemostasis.  The St. Jude Epic plus VR single chamber defibrillator, serial  [id number] was connected to the defibrillation lead and placed in the  subcutaneous pocket.  The patient was more deeply sedated and defibrillation  threshold testing carried out.  After the patient was sedated with fentanyl  and versed, ventricular fibrillation was induced and a 12 joule shock  delivered terminating ventricular fibrillation and restoring sinus rhythm.  Five minutes was allowed to elapse and a second VFT test carried out.  Again  VF was induced in the low fib mode and an 8 joule shock terminated  ventricular fibrillation restoring sinus rhythm.  At this point no  additional defibrillation threshold testing was carried out and the incision  was closed with a layer of 2-0 Vicryl followed by a layer of 3-0 Vicryl  followed by a layer of 4-0 Vicryl.  Benzoin was painted on the skin and  Steri-Strips applied and a pressure dressing placed and the patient returned  to her room in satisfactory condition.   COMPLICATIONS:  There were no immediate procedure complications.   RESULTS:  This demonstrates successful implantation of a St. Jude single  chamber defibrillator in a patient with sarcoidosis, cardiomyopathy and  ejection fraction 20%, nonsustained ventricular tachycardia, and severe left  ventricular dysfunction with ejection fraction of 20% and congestive heart  failure.                                               Champ Mungo. Lovena Le, M.D.    GWT/MEDQ   D:  03/28/2003  T:  03/29/2003  Job:  [id number]   cc:   Fransico Him, M.D.  [address] [place]  [place], [place] [address]  Fax: [phone]   Beatrix Fetters, MD   Talbert Cage, M.D.  [place]. [place]  [place] [address]  Fax: [phone]

## 2010-08-31 IMAGING — CR DG LUMBAR SPINE COMPLETE 4+V
5 series · 5 of 5 positions shown · non-contrast
Comparison: 05/28/2008

CLINICAL DATA: Back pain.  Osteoporosis.  Assess for fracture.

LUMBAR SPINE - COMPLETE 4+ VIEW

[t l-spine lat]
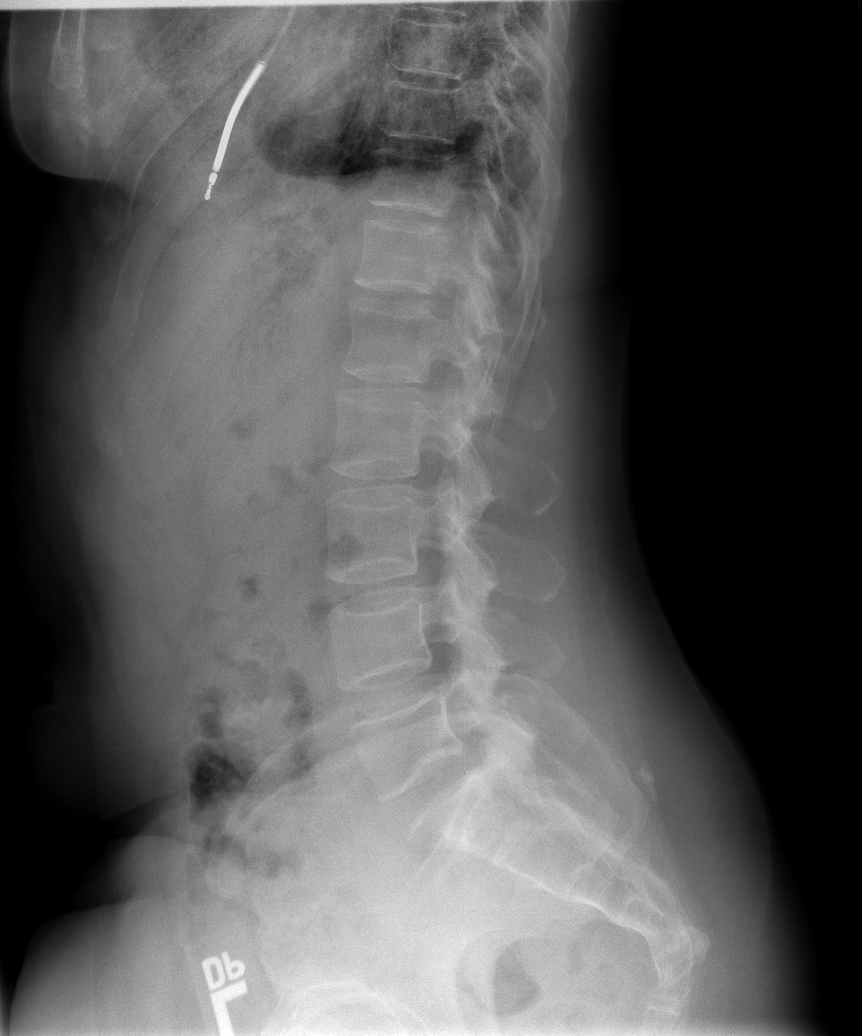

[t l-spine l5-s1 spot]
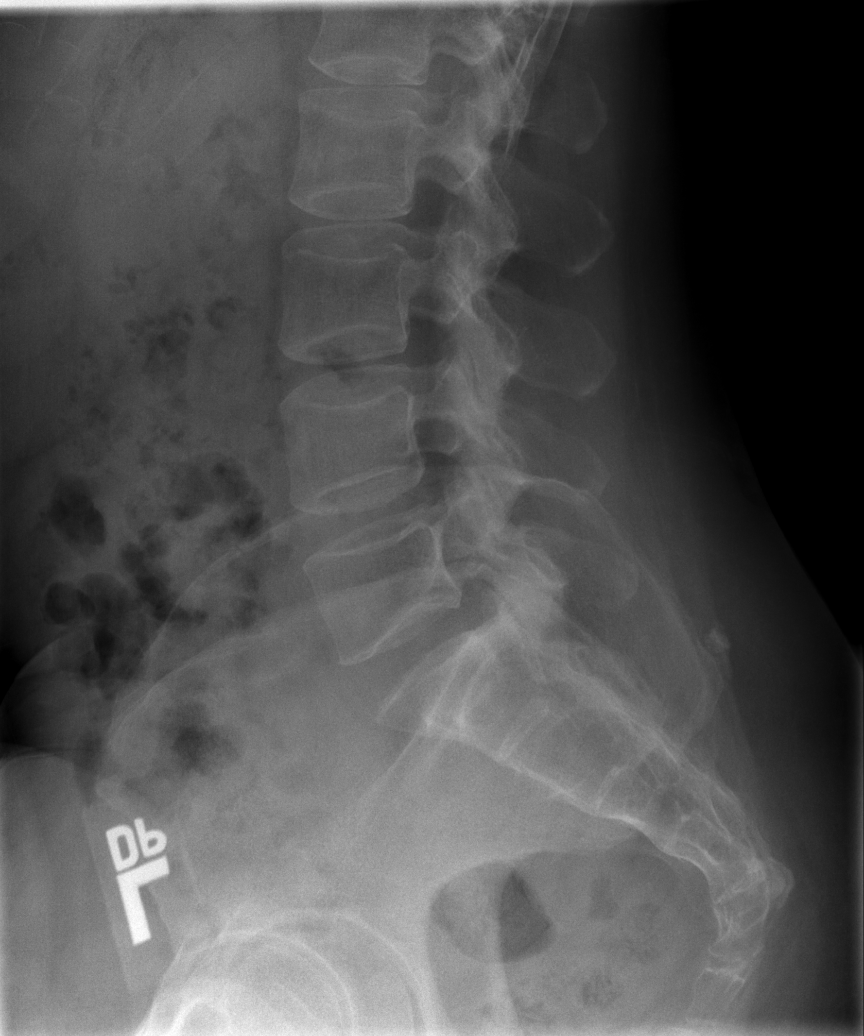

[t l-spine a.p.]
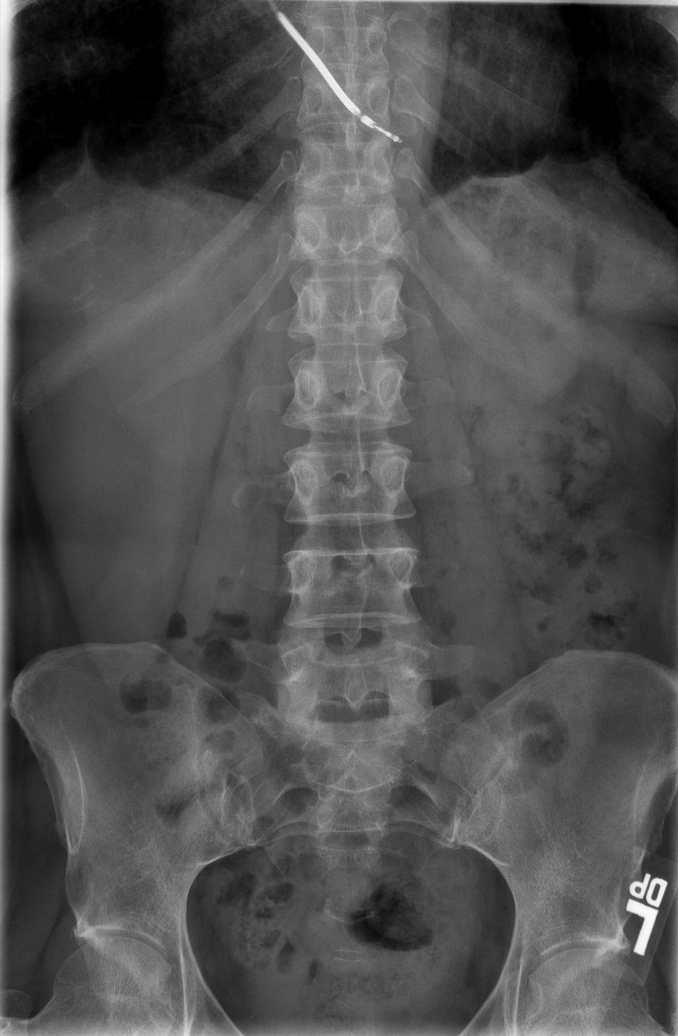

[t l-spine oblique exposure (1 of 2)]
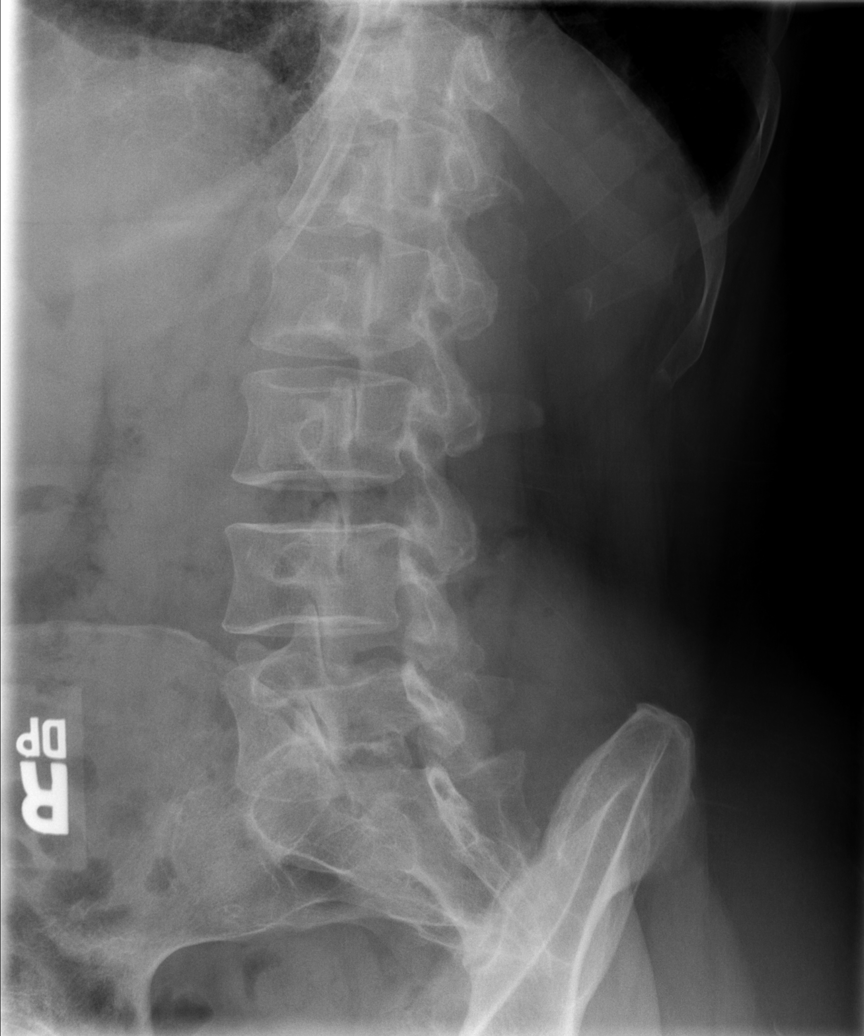

[t l-spine oblique exposure (2 of 2)]
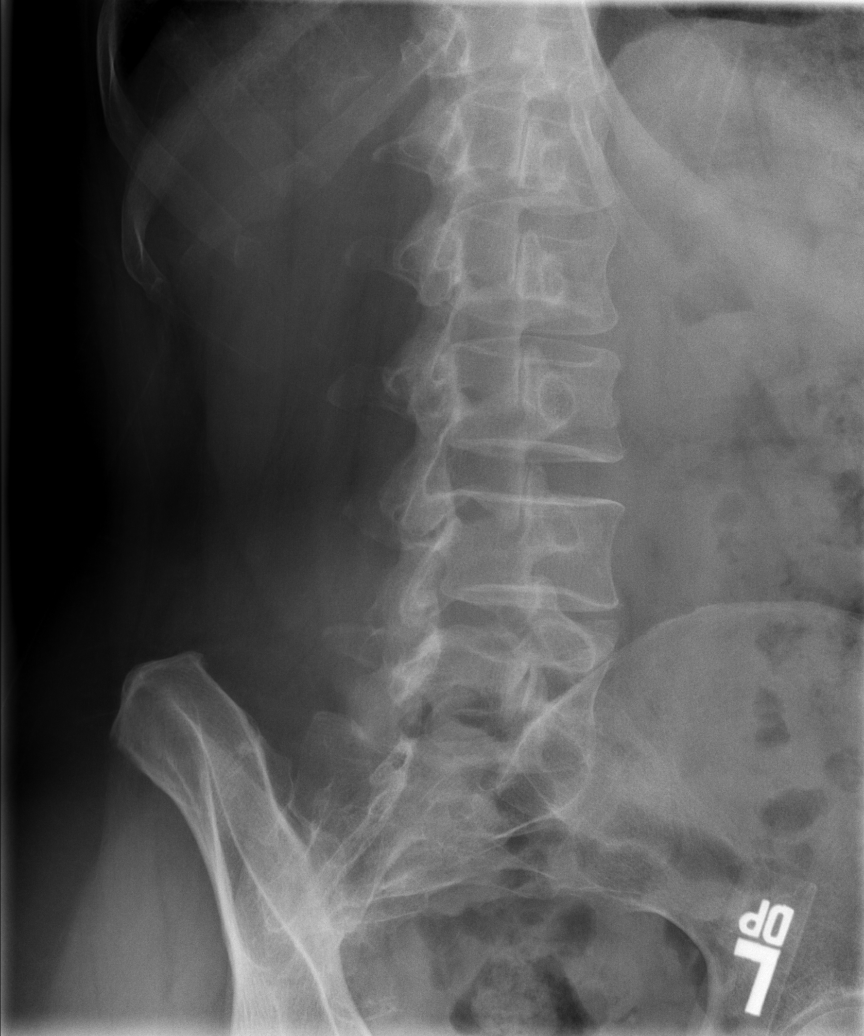

[5 of 5 positions shown; findings below may reference images not displayed]

FINDINGS: Alignment is normal.  No fracture.  No disc space
narrowing.  No osseous degenerative changes.  No focal lesions.
IMPRESSION: Normal radiographs

## 2010-09-12 ENCOUNTER — Telehealth: Payer: Self-pay | Admitting: Family Medicine

## 2010-09-12 NOTE — Telephone Encounter (Signed)
Pt is calling asking to speak with Dr Buelah Manis about some issues she is having.

## 2010-09-12 NOTE — Telephone Encounter (Signed)
Rachel [name] can make an appt if she has many issues to discuss. Please see if she can do this.She has too many meds and things to review over the phone

## 2010-09-12 NOTE — Telephone Encounter (Signed)
Called patient, she is requesting to speak with Dr Buelah Manis. Would like to discuss some of her issues before [place]. Will forward to Dr Crist Infante, Kevin Fenton

## 2010-09-13 ENCOUNTER — Other Ambulatory Visit: Payer: Self-pay | Admitting: Family Medicine

## 2010-09-13 ENCOUNTER — Telehealth: Payer: Self-pay | Admitting: *Deleted

## 2010-09-13 NOTE — Telephone Encounter (Signed)
[name] needs to call her pharmacy and request the refills so this is done electronically. I have asked her to do this a few months ago, this is the quickest way to get her medications.

## 2010-09-13 NOTE — Telephone Encounter (Signed)
Dr. Buelah Manis, I tried telling pt this but she insisted they will not send them to [place] electronically.

## 2010-09-13 NOTE — Telephone Encounter (Signed)
Called pt. Advised pt to schedule appt with Dr.[place]. Pt was very persistent about speaking with Dr.Klemme. I had to repeat several times, that in order for her to discuss 'personal issues' she has to schedule office visit with Dr.Eden. She agreed to schedule office visit, but was not happy about. She said, that 'why would I schedule a visit to talk? But, tell her, I would do it'. Fwd. To Dr.Beecher .[place]

## 2010-09-13 NOTE — Telephone Encounter (Signed)
Pt is requesting refills on 3 meds, says she needs a new rx sent to [facility] order pharmacy for 3 month supply, Potassium, Coreg and Risperdal. Fax to [phone], must have pt member id # [id number]

## 2010-09-13 NOTE — Telephone Encounter (Signed)
Will fwd. To PCP .[place] Area

## 2010-09-14 MED ORDER — RISPERIDONE 0.5 MG PO TABS: 0.5000 mg | ORAL_TABLET | Freq: Every day | ORAL | Status: DC

## 2010-09-14 MED ORDER — CARVEDILOL 3.125 MG PO TABS: 3.1250 mg | ORAL_TABLET | Freq: Two times a day (BID) | ORAL | Status: DC

## 2010-09-14 MED ORDER — POTASSIUM CHLORIDE CRYS ER 20 MEQ PO TBCR: EXTENDED_RELEASE_TABLET | ORAL | Status: DC

## 2010-09-14 NOTE — Telephone Encounter (Signed)
Printed refills and placed in to be fax pile

## 2010-09-19 ENCOUNTER — Encounter: Payer: Self-pay | Admitting: Cardiology

## 2010-10-09 ENCOUNTER — Encounter: Payer: Self-pay | Admitting: Family Medicine

## 2010-11-01 ENCOUNTER — Encounter: Payer: Self-pay | Admitting: Cardiology

## 2010-11-06 ENCOUNTER — Ambulatory Visit (INDEPENDENT_AMBULATORY_CARE_PROVIDER_SITE_OTHER): Payer: Medicare Other | Admitting: Cardiology

## 2010-11-06 ENCOUNTER — Encounter: Payer: Self-pay | Admitting: Cardiology

## 2010-11-06 ENCOUNTER — Encounter: Payer: Self-pay | Admitting: *Deleted

## 2010-11-06 VITALS — BP 129/78 | HR 72 | Resp 16 | Ht 63.0 in | Wt 185.0 lb

## 2010-11-06 DIAGNOSIS — D869 Sarcoidosis, unspecified: Secondary | ICD-10-CM

## 2010-11-06 DIAGNOSIS — I272 Pulmonary hypertension, unspecified: Secondary | ICD-10-CM

## 2010-11-06 DIAGNOSIS — R0602 Shortness of breath: Secondary | ICD-10-CM

## 2010-11-06 DIAGNOSIS — I2789 Other specified pulmonary heart diseases: Secondary | ICD-10-CM

## 2010-11-06 LAB — CBC WITH DIFFERENTIAL/PLATELET
Basophils Relative: 0.5 % (ref 0.0–3.0)
Eosinophils Relative: 5.4 % — ABNORMAL HIGH (ref 0.0–5.0)
HCT: 34.9 % — ABNORMAL LOW (ref 36.0–46.0)
Lymphs Abs: 1.9 10*3/uL (ref 0.7–4.0)
MCHC: 33.1 g/dL (ref 30.0–36.0)
MCV: 89.4 fl (ref 78.0–100.0)
Monocytes Absolute: 0.3 10*3/uL (ref 0.1–1.0)
Platelets: 216 10*3/uL (ref 150.0–400.0)
WBC: 7.1 10*3/uL (ref 4.5–10.5)

## 2010-11-06 LAB — BASIC METABOLIC PANEL
BUN: 18 mg/dL (ref 6–23)
CO2: 32 mEq/L (ref 19–32)
Chloride: 103 mEq/L (ref 96–112)
Potassium: 5.1 mEq/L (ref 3.5–5.1)

## 2010-11-06 NOTE — Assessment & Plan Note (Signed)
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 Iloprost was started around 9/11 and uptitrated. Given her parenchymal lung disease, it should target ventilated segments of the lung and limit greater V/Q mismatching. She feels much better symptomatically (still class III symptoms though), and she has made improvement on her 6 minute walk. I am going to continue iloprost (now 5 micrograms 6 x a day) and Revatio. She is very compliant with iloprost dosing. Will get BMET today (on Lasix every other day). Volume status looks good. I am going to do a right heart cath now that she has been on iloprost therapy for about a year to see if there has been improvement.

## 2010-11-06 NOTE — Assessment & Plan Note (Signed)
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  No longer active inflammatory sarcoid, however.

## 2010-11-06 NOTE — Progress Notes (Signed)
PCP: Dr. 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 She is now on Revatio 80 mg tid and and inhaled Iloprost. She is being closely monitored by [facility] and the Iloprost dose has been increased to 5 mcg 6 x daily.  She has been evaluated for heart/lung transplant at [facility] as an inpatient but needed to raise around $10,000 to continue evaluation and has decided against this for now.  She was in the hospital in 4/12 with carbon monoxide poisoning.  She has been seeing [name], and sarcoidosis seems to be quiescent.   Patient is feeling symptomatically better on Iloprost. She is able to walk around her house without dyspnea.  She is able to do more housework. Quality of life is much better. No chest pain. No syncope/presyncope.  Weight is up about 12 lbs since I last saw her, she says that she has been eating more and has more of an appetite.  6 minute walk today was stable at 248 meters.   Labs (11/10): BNP 91, creatinine 1.0, K 4  Labs (2/11): K 4.6, creatinine 0.86  Labs (3/11): BNP 58, K 4.2, creatinine 0.8  Labs (10/11): K 4.5, creatinine 0.8, BNP 76   Allergies (verified):  1) ! * Steroids High Dose  2) Demerol  3) Ultram  4) Darvocet-N 100  5) Doxycycline   Past Medical History:  1. Sarcoidosis: Stage IV pulmonary sarcoid on home O2.  Has been quiescent recently.  She has been referred to [facility] for lung transplant evaluation.  2. Cardiomyopathy: Cardiac MRI in 2004 with no evidence for infiltrative disease but EF 40%. Echo (9/10): EF 60%, severely dilated RV with moderately decreased systolic function, moderate RAE, PASP 83 mmHg.  Echo (1/12): EF 63%, grade I diastolic dysfunction, moderate biatrial enlargement, moderate RV dilation with normal RV systolic function, peak RV-RA gradient 34 mmHg.  3. Pulmonary hypertension with RV failure: Moderate to severe, likely secondary to sarcoidosis and  parenchymal lung disease/hypoxic pulmonary vasoconstriction. RHC 5/10 showed PA 67/30 (mean 46) with mean PCWP 6 mmHg. Patient started on Revatio in 5/10 without any change in oxygen saturation. 6 minute walk 7/10: 61 m. Echo (9/10): EF 60%, severely dilated RV with moderately decreased systolic function, moderate RAE, PASP 83 mmHg. RHC (9/10) with mean RA 15, PA 74/36, CI 2.8. Repeat RHC after diuresis with mean RA 3, PA 60/22, mean PCWP 4. Echo (1/11): EF 81-77%, grade I diastolic dysfunction, moderately dilated RV, mild to moderate RV dysfunction, moderate to severe TR, PASP 58 mmHg. 6 minute walk (2/11): 122 m. 6 min walk (6/11): 161.5 m. RHC (6/11): mean RA 11, RV 64/15, PA 63/28 mean 43, mean PCWP 14, CI 2.4 thermodilution and 3.2 Fick, PVR 5.3 WU Fick and 7.25 WU thermodilution. Iloprost begun. 6 min walk (10/11): 158 m. 6 min walk (1/12) 273 m.  6 min walk (7/12) 248 m.  4. 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 Device is now nearing ERI. Given end stage lung disease and normalization of LV function, the device will not be replaced and tachy therapies were turned off.  5. GERD  6. Chronic chest pain. LHC (5/10) with no angiographic coronary disease.  7. Allergic rhinitis,  8. h/o iron deficiency anemia.  9. H/o secondary amenorrhea/irregular menses,  10. Psychosis with high dose steroids.  11. Possible runs of SVT  12. Recurrent Hypokalemia  13. h/o Rotator cuff sprain  14.  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   Review of Systems  All systems reviewed and negative except as  per HPI.   Current Outpatient Prescriptions  Medication Sig Dispense Refill  . acetaminophen (TYLENOL) 325 MG tablet Take 650 mg by mouth as needed.        Marland Kitchen albuterol (PROAIR HFA) 108 (90 BASE) MCG/ACT inhaler Inhale 2 puffs into the lungs 4 (four) times daily.  8.5 g  prn  . aspirin 81 MG tablet Take 81 mg by mouth daily.        . carvedilol (COREG) 3.125 MG tablet Take 1 tablet (3.125 mg total) by mouth 2 (two) times daily.  180 tablet  1  . esomeprazole (NEXIUM) 40 MG capsule Take 1 capsule (40 mg total) by mouth daily before breakfast.  90 capsule  3  . fluticasone (FLOVENT HFA) 110 MCG/ACT inhaler Take one to two puffs two times a day       . furosemide (LASIX) 40 MG tablet 1 every other day      . Iloprost (VENTAVIS) 20 MCG/ML SOLN Inhale into the lungs 6 (six) times daily.        . potassium chloride SA (K-DUR,KLOR-CON) 20 MEQ tablet 2 tabs am and 1 tab po qhs      . risperiDONE (RISPERDAL) 0.5 MG tablet Take 1 tablet (0.5 mg total) by mouth at bedtime.  90 tablet  1  . sildenafil (REVATIO) 20 MG tablet take 4  tablets every 8 hours       . DISCONTD: potassium chloride SA (K-DUR,KLOR-CON) 20 MEQ tablet take two tabs in am and 1 tab in the pm take two tabs in am and 1 tab in the pm  270 tablet  1    BP 129/78  Pulse 72  Resp 16  Ht 5' 3" (1.6 m)  Wt 185 lb (83.915 kg)  BMI 32.77 kg/m2 General: Well-developed,well-nourished,in no acute distress; O2 applied via nasal cannula  Neck: Neck supple, JVP 7-8 cm. No masses, thyromegaly or abnormal cervical nodes.  Lungs: Clear bilaterally to auscultation and percussion.  Heart: Non-displaced PMI, chest non-tender; regular rate and rhythm, S1, S2 without rubs or gallops. 1/6 systolic murmur LLSB. Loud P2. Carotid upstroke normal, no bruit. Pedals normal pulses. No edema.  Abdomen: Bowel sounds positive; abdomen soft and non-tender without masses, organomegaly, or hernias noted. No hepatosplenomegaly.  Extremities: No clubbing or cyanosis.    Neurologic: Alert and oriented x 3.  Psych: Normal affect.

## 2010-11-06 NOTE — Progress Notes (Signed)
6 Minute Walk Test --pt walked 815 ft in 6 minutes on 6L of O2-her O2 sat was 74%

## 2010-11-06 NOTE — Patient Instructions (Signed)
Lab today--BMP/CBC/PT   You are scheduled for a cardiac catheterization Thursday August 9,2012. See instruction sheet.  Schedule an appointment to see Dr Aundra Dubin in 3 months.

## 2010-11-07 ENCOUNTER — Telehealth: Payer: Self-pay | Admitting: Cardiology

## 2010-11-15 ENCOUNTER — Inpatient Hospital Stay (HOSPITAL_BASED_OUTPATIENT_CLINIC_OR_DEPARTMENT_OTHER)
Admission: RE | Admit: 2010-11-15 | Discharge: 2010-11-15 | Disposition: A | Discharge status: Home / Self Care | Payer: Medicare Other | Source: Ambulatory Visit | Attending: Cardiology | Admitting: Cardiology

## 2010-11-15 DIAGNOSIS — D869 Sarcoidosis, unspecified: Secondary | ICD-10-CM | POA: Insufficient documentation

## 2010-11-15 DIAGNOSIS — I279 Pulmonary heart disease, unspecified: Secondary | ICD-10-CM

## 2010-11-15 DIAGNOSIS — I2789 Other specified pulmonary heart diseases: Secondary | ICD-10-CM | POA: Insufficient documentation

## 2010-11-15 LAB — POCT I-STAT 3, VENOUS BLOOD GAS (G3P V)
Acid-Base Excess: 2 mmol/L (ref 0.0–2.0)
Bicarbonate: 29.4 mEq/L — ABNORMAL HIGH (ref 20.0–24.0)
O2 Saturation: 74 %
pO2, Ven: 43 mmHg (ref 30.0–45.0)

## 2010-11-26 ENCOUNTER — Encounter: Payer: Self-pay | Admitting: Family Medicine

## 2010-11-26 ENCOUNTER — Ambulatory Visit (INDEPENDENT_AMBULATORY_CARE_PROVIDER_SITE_OTHER): Payer: Medicare Other | Admitting: Family Medicine

## 2010-11-26 VITALS — BP 138/85 | HR 79 | Wt 184.0 lb

## 2010-11-26 DIAGNOSIS — R252 Cramp and spasm: Secondary | ICD-10-CM

## 2010-11-26 DIAGNOSIS — I2789 Other specified pulmonary heart diseases: Secondary | ICD-10-CM

## 2010-11-26 NOTE — Progress Notes (Signed)
  Subjective:    Patient ID: Rachel [name], female    DOB: April 03, 1961, 50 y.o.   MRN: [id number]  HPI 1.  Pulmonary Hypertension:  Patient here to meet new pcp and have paperwork filled out for disability from her pulmonary hypertension 2/2 to her sarcoidosis.  Has been followed by Dr. Aundra Dubin as well as  [name] for this.  Recently Dr. Aundra Dubin started her on Ventavis to help with her pulmonary hypertension.  She states this has helped her significantly.  She went from being unable to ambulate around the house without getting short of breath to back to being able to do simple household tasks at this point.  She is on home O2 all the time with baseline of 4 liters.  She is planning on seeing a new pulmonologist in the near future.  Currently she denies chest pain, increased shortness of breath, nausea, headache. 2.  Leg Cramping:  Has been having cramping in her legs for the past few days.  Cramping is in the upper legs bilaterally along the quadriceps area.  She states it "feels like a charlie horse" that she used to get as a kid.  She is on daily lasix and takes potassium supplementation and recent labs from 7/31 show a potassium level of 5.1.  Denies trauma to the leg and has no radiation of symptoms.    Review of Systems    Per HPI Objective:   Physical Exam  Constitutional: She appears well-developed and well-nourished. No distress.  Cardiovascular: Normal rate and regular rhythm.   Murmur heard.      1/6 systolic murmur at LLSB  Pulmonary/Chest: Effort normal and breath sounds normal. No respiratory distress. She has no wheezes. She has no rales.  Musculoskeletal: Normal range of motion. She exhibits no tenderness.       No tenderness or spasm to palpation of legs bilaterally.           Assessment & Plan:

## 2010-11-29 ENCOUNTER — Telehealth: Payer: Self-pay | Admitting: Family Medicine

## 2010-11-29 NOTE — Telephone Encounter (Signed)
Rachel [name] is calling to find out if her disability papers have been completed, because she would like a copy before they get sent in.  She also would like her lab results.

## 2010-11-29 NOTE — Telephone Encounter (Signed)
Called pt and she reports that Dr.Matthews has her forms for the disability. He needs to mail it to the address on the form, after completion. She request to send a copy of it to her house and also a copy of her last lab work. I informed the pt, that her Magnesium level was normal. Fwd. To Dr.Matthews. Javier Glazier, [employer]. She also wants a phone call before mailing her application and form.

## 2010-12-03 NOTE — Telephone Encounter (Signed)
I explained to her when she was here for her appointment that it would take a few days to review her complete history and complete the paperwork.  It will go out tomorrow.

## 2010-12-03 NOTE — Telephone Encounter (Signed)
Rachel [name] has called back checking on the forms for her disability.  She said she will keep calling until she gets what she needs.

## 2010-12-04 ENCOUNTER — Telehealth: Payer: Self-pay | Admitting: Family Medicine

## 2010-12-04 NOTE — Assessment & Plan Note (Signed)
Description sounds like cramping of legs.  Potassium normal when checked a couple weeks ago.  Will check a magnesium level today as well given that she is on chronic lasix.  Advised stretches as tolerated.

## 2010-12-04 NOTE — Assessment & Plan Note (Signed)
Pleased with the change on iloprost.  Will complete paperwork for disability, explained to her this may take a few days to complete and get sent off as I review her history since this is my first time seeing her.

## 2010-12-05 ENCOUNTER — Encounter: Payer: Medicare Other | Admitting: Physician Assistant

## 2010-12-05 NOTE — Telephone Encounter (Signed)
error

## 2010-12-06 ENCOUNTER — Encounter: Payer: Medicare Other | Admitting: Physician Assistant

## 2010-12-06 ENCOUNTER — Encounter: Payer: Self-pay | Admitting: Family Medicine

## 2010-12-18 ENCOUNTER — Encounter: Payer: Self-pay | Admitting: Physician Assistant

## 2010-12-18 ENCOUNTER — Ambulatory Visit (INDEPENDENT_AMBULATORY_CARE_PROVIDER_SITE_OTHER): Payer: Medicare Other | Admitting: Physician Assistant

## 2010-12-18 ENCOUNTER — Encounter: Payer: Medicare Other | Admitting: *Deleted

## 2010-12-18 VITALS — BP 135/79 | HR 78 | Ht 63.0 in | Wt 182.4 lb

## 2010-12-18 DIAGNOSIS — I2789 Other specified pulmonary heart diseases: Secondary | ICD-10-CM

## 2010-12-18 NOTE — Assessment & Plan Note (Signed)
Right groin site stable post right heart catheterization.  As noted, right heart catheter parameters have improved.  She is continued on her current medications for pulmonary hypertension.  Follow up with Dr. Aundra Dubin in October as scheduled.

## 2010-12-18 NOTE — Patient Instructions (Signed)
Your physician recommends that you schedule a follow-up appointment in: 02/05/11 @ 10 am to see Dr. Aundra Dubin  NO CHANGES ARE MADE AT THIS TIME TODAY.

## 2010-12-18 NOTE — Progress Notes (Signed)
History of Present Illness: Primary Cardiologist:  Dr. [name] PCP: Dr. Drue Stager Fee is a 50 y.o. 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 She is now on Revatio 80 mg tid and and inhaled Iloprost. She is being closely monitored by [facility] and the Iloprost dose has been increased to 5 mcg 6 x daily.  She has been evaluated for heart/lung transplant at [facility] as an inpatient but needed to raise around $10,000 to continue evaluation and has decided against this for now.  She was in the hospital in 4/12 with carbon monoxide poisoning.  She has been seeing [name], and sarcoidosis seems to be quiescent.   She saw Dr. Aundra Dubin 11/05/10 and was feeling symptomatically better on Iloprost. She was able to walk around her house without dyspnea.  She was able to do more housework. Quality of life was noted to be much better.   Right heart catheterization was planned since it had been a year since her previous catheterization and there had been significant improvement.    Right heart cath was performed 8/9: Mean RA 2 mmHg, RV 48/3, PA 51/24 with a mean PAP 35 mmHg, mean PCWP 6 mmHg; CO 5.7, CI 3; mixed venous oxygen saturation 74%, PVR 5.1 Woods units.  The wedge pressure was significantly lower than the catheterization one year prior and cardiac output was somewhat higher.  It was felt that she was doing well with treatment and she was continued on her current medications.  It was not felt that she would have significant benefit from advancing her therapy any further.  Doing ok.  DOE still improved.  Reports 2b-3 symptoms.  No chest pain.  No syncope.  No orthopnea, PND or significant edema.  Right groin site ok.    Labs (11/10): BNP 91, creatinine 1.0, K 4  Labs (2/11): K 4.6, creatinine 0.86  Labs (3/11): BNP 58, K 4.2, creatinine 0.8  Labs (10/11): K 4.5, creatinine 0.8, BNP 76  Labs (7/12):  K 5.1, creatinine 0.8, Mg 2.0  Past  Medical History:  1. Sarcoidosis: Stage IV pulmonary sarcoid on home O2.  Has been quiescent recently.  She has been referred to [facility] for lung transplant evaluation.  2. Cardiomyopathy: Cardiac MRI in 2004 with no evidence for infiltrative disease but EF 40%. Echo (9/10): EF 60%, severely dilated RV with moderately decreased systolic function, moderate RAE, PASP 83 mmHg.  Echo (1/12): EF 63%, grade I diastolic dysfunction, moderate biatrial enlargement, moderate RV dilation with normal RV systolic function, peak RV-RA gradient 34 mmHg.  3. Pulmonary hypertension with RV failure: Moderate to severe, likely secondary to sarcoidosis and parenchymal lung disease/hypoxic pulmonary vasoconstriction. RHC 5/10 showed PA 67/30 (mean 46) with mean PCWP 6 mmHg. Patient started on Revatio in 5/10 without any change in oxygen saturation. 6 minute walk 7/10: 61 m. Echo (9/10): EF 60%, severely dilated RV with moderately decreased systolic function, moderate RAE, PASP 83 mmHg. RHC (9/10) with mean RA 15, PA 74/36, CI 2.8. Repeat RHC after diuresis with mean RA 3, PA 60/22, mean PCWP 4. Echo (1/11): EF 81-77%, grade I diastolic dysfunction, moderately dilated RV, mild to moderate RV dysfunction, moderate to severe TR, PASP 58 mmHg. 6 minute walk (2/11): 122 m. 6 min walk (6/11): 161.5 m. RHC (6/11): mean RA 11, RV 64/15, PA 63/28 mean 43, mean PCWP 14, CI 2.4 thermodilution and 3.2 Fick, PVR 5.3 WU Fick and 7.25 WU thermodilution. Iloprost  begun. 6 min walk (10/11): 158 m. 6 min walk (1/12) 273 m.  6 min walk (7/12) 248 m.   [place] 8/12:  Mean RA 48mHg, RV 48/3, PA 51/24 with a mean PAP 35 mmHg, mean PCWP 6 mmHg; CO 5.7, CI 3; mixed venous oxygen saturation 74%, PVR 5.1 Woods units2:   4. 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 Device is now nearing ERI. Given end stage lung disease and normalization of LV function, the device will not be replaced and tachy therapies were turned off.  5. GERD  6. Chronic chest pain. LHC  (5/10) with no angiographic coronary disease.  7. Allergic rhinitis,  8. h/o iron deficiency anemia.  9. H/o secondary amenorrhea/irregular menses,  10. Psychosis with high dose steroids.  11. Possible runs of SVT  12. Recurrent Hypokalemia  13. h/o Rotator cuff sprain  14. Chronic back pain    Current Outpatient Prescriptions  Medication Sig Dispense Refill  . acetaminophen (TYLENOL) 325 MG tablet Take 650 mg by mouth as needed.        .Marland Kitchenalbuterol (PROAIR HFA) 108 (90 BASE) MCG/ACT inhaler Inhale 2 puffs into the lungs 4 (four) times daily.  8.5 g  prn  . aspirin 81 MG tablet Take 81 mg by mouth daily.        . carvedilol (COREG) 3.125 MG tablet Take 1 tablet (3.125 mg total) by mouth 2 (two) times daily.  180 tablet  1  . esomeprazole (NEXIUM) 40 MG capsule Take 1 capsule (40 mg total) by mouth daily before breakfast.  90 capsule  3  . fluticasone (FLOVENT HFA) 110 MCG/ACT inhaler Take one to two puffs two times a day       . furosemide (LASIX) 40 MG tablet 1 every other day      . Iloprost (VENTAVIS) 20 MCG/ML SOLN Inhale into the lungs 6 (six) times daily.        . potassium chloride SA (K-DUR,KLOR-CON) 20 MEQ tablet 2 tabs am and 1 tab po qhs      . risperiDONE (RISPERDAL) 0.5 MG tablet Take 1 tablet (0.5 mg total) by mouth at bedtime.  90 tablet  1  . sildenafil (REVATIO) 20 MG tablet take 4  tablets every 8 hours         Allergies: Allergies  Allergen Reactions  . Meperidine Hcl     REACTION: Makes her feel funny  . Other     High Dose Steroids  . Propoxyphene N-Acetaminophen     REACTION: rash: pruritus  . 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   Vital Signs: BP 135/79  Pulse 78  Ht _0  (1.6 m)  Wt 182 lb 6.4 oz (82.736 kg)  BMI 32.31 kg/m2  PHYSICAL EXAM: Well nourished, well developed, in no acute distress HEENT: normal Neck: supple Cardiac:  normal S1, S2; RRR; no murmur Lungs: decreased breath sounds bilaterally, no wheezing, rhonchi or rales Abd: soft, nontender, no hepatomegaly Ext: no edema;  Right FV site without  hematoma or bruit Skin: warm and dry Neuro:  CNs 2-12 intact, no focal abnormalities noted  EKG:  Sinus rhythm, heart rate 72, normal axis, nonspecific ST-T wave changes  ASSESSMENT AND PLAN:

## 2010-12-22 NOTE — Cardiovascular Report (Signed)
  NAMEHARMONII, Rachel [name] NO.:  [id number]  MEDICAL RECORD NO.:  [id number]  LOCATION:  0312                         FACILITY:  [facility]  PHYSICIAN:  [name], MD      DATE OF BIRTH:  Aug 08, 1960  DATE OF PROCEDURE:  11/15/2010 DATE OF DISCHARGE:  07/27/2010                           CARDIAC CATHETERIZATION   PROCEDURE:  Right heart catheterization.  INDICATIONS:  This is a 50 year old with pulmonary hypertension, who was begun on iloprost about a year ago.  We are doing a right heart catheterization today to see if there has been any effect on her PA pressure and cardiac output.  PROCEDURE NOTE:  After informed consent was obtained, the right groin was sterilely prepped and draped.  A 1% lidocaine was used to locally anesthetize the right groin area.  The right common femoral vein was entered using modified Seldinger technique and a 7-French venous sheath was placed.  Right heart catheterization was carried out using the balloon-tip Swan-Ganz catheter.  A sample was removed for saturation measurement from pulmonary artery.  FINDINGS:  Mean right atrial pressure 2 mmHg, RV 48/3, PA 51/24 with mean PA pressure 35 mmHg, mean pulmonary capillary wedge pressure 6 mmHg.  Cardiac output 5.7 and cardiac index 3.  Mixed venous oxygen saturation 74%, pulmonary vascular resistance is 5.1 Wood units.  IMPRESSION:  The patient's mean pulmonary artery pressure is significantly lower than catheterization last year prior to starting iloprost.  Cardiac output is somewhat higher.  I think she seems to be doing well with treatment, and I am going to continue her current medications.  She does have significant structural lung disease from her sarcoidosis.  I do not think that we would be likely to obtain a whole lot more pulmonary artery pressure improvement by further advancing therapy, as I think she is going to have residual pulmonary hypertension regardless of what we do  because of her lung scarring from sarcoidosis.     [name], MD     DM/MEDQ  D:  11/15/2010  T:  11/15/2010  Job:  [id number]  cc:   Vic Blackbird, MD  Electronically Signed by [name] MD on 12/22/2010 10:03:39 PM

## 2010-12-28 ENCOUNTER — Telehealth: Payer: Self-pay | Admitting: Cardiology

## 2010-12-28 NOTE — Telephone Encounter (Signed)
Pt wanting someone to call her and verify if pt pacemaker had or had not been turned off. Please return pt call to discuss further.   Pt said she was told by Dr. Lovena Le that pacemaker was turned off 04/25/2009.

## 2010-12-28 NOTE — Telephone Encounter (Signed)
Attempted to call pt unable to  leave a message.

## 2010-12-31 LAB — BASIC METABOLIC PANEL
BUN: 9
CO2: 21
CO2: 23
CO2: 28
Calcium: 8.3 — ABNORMAL LOW
Calcium: 8.6
Chloride: 110
GFR calc Af Amer: >60
GFR calc Af Amer: >60
GFR calc Af Amer: >60
GFR calc non Af Amer: >60
GFR calc non Af Amer: >60
Glucose, Bld: 113 — ABNORMAL HIGH
Glucose, Bld: 129 — ABNORMAL HIGH
Potassium: 4.4
Potassium: 4.4
Sodium: 138
Sodium: 140
Sodium: 140
Sodium: 140

## 2010-12-31 LAB — I-STAT 8, (EC8 V) (CONVERTED LAB)
Bicarbonate: 28.8 — ABNORMAL HIGH
Glucose, Bld: 123 — ABNORMAL HIGH
Hemoglobin: 12.6
Operator id: 265201
Sodium: 137
TCO2: 30

## 2010-12-31 LAB — COMPREHENSIVE METABOLIC PANEL
Alkaline Phosphatase: 102
BUN: 11
CO2: 26
GFR calc non Af Amer: >60
Glucose, Bld: 155 — ABNORMAL HIGH
Potassium: 3.8
Total Bilirubin: 1.3 — ABNORMAL HIGH
Total Protein: 7.5

## 2010-12-31 LAB — CULTURE, BLOOD (ROUTINE X 2)

## 2010-12-31 LAB — DIFFERENTIAL
Blasts: 0
Eosinophils Absolute: 0.2
Metamyelocytes Relative: 0
Myelocytes: 0
WBC Morphology: INCREASED
nRBC: 0

## 2010-12-31 LAB — CBC
HCT: 31.5 — ABNORMAL LOW
Hemoglobin: 10 — ABNORMAL LOW
Hemoglobin: 10.7 — ABNORMAL LOW
Hemoglobin: 9.8 — ABNORMAL LOW
MCHC: 33.6
MCHC: 34
MCHC: 34.1
MCV: 88.1
Platelets: 216
RBC: 3.31 — ABNORMAL LOW
RDW: 13.2
RDW: 13.3
RDW: 13.4
WBC: 13.2 — ABNORMAL HIGH

## 2010-12-31 LAB — B-NATRIURETIC PEPTIDE (CONVERTED LAB): Pro B Natriuretic peptide (BNP): 1374 — ABNORMAL HIGH

## 2010-12-31 LAB — POCT I-STAT CREATININE: Operator id: 265201

## 2010-12-31 LAB — CARDIAC PANEL(CRET KIN+CKTOT+MB+TROPI)
CK, MB: 3.2
Relative Index: INVALID
Total CK: 57

## 2010-12-31 NOTE — Telephone Encounter (Signed)
Spoke with pt and gave her information from last office visit with Richardson Dopp, PA that tachy therapy settings were turned off.  Will forward to device clinic to verify all settings. She would like device clinic to call her back once reviewed

## 2011-01-03 NOTE — Telephone Encounter (Signed)
Pt calling [facility]. Please return her call.  Pt will be doing breathing treatment at 1 pm.  Please try to call pt before 1 pm.

## 2011-01-11 ENCOUNTER — Ambulatory Visit (INDEPENDENT_AMBULATORY_CARE_PROVIDER_SITE_OTHER): Payer: Medicare Other

## 2011-01-11 DIAGNOSIS — Z23 Encounter for immunization: Secondary | ICD-10-CM

## 2011-01-22 LAB — DIFFERENTIAL
Eosinophils Absolute: 0.4
Eosinophils Relative: 7 — ABNORMAL HIGH
Lymphs Abs: 1.5
Monocytes Absolute: 0.6
Monocytes Relative: 11

## 2011-01-22 LAB — CBC
HCT: 31.8 — ABNORMAL LOW
MCV: 88.1
RBC: 3.61 — ABNORMAL LOW
WBC: 5.7

## 2011-01-22 LAB — POCT CARDIAC MARKERS
Myoglobin, poc: 41.2
Operator id: 282201
Troponin i, poc: <0.05
Troponin i, poc: <0.05

## 2011-01-22 LAB — I-STAT 8, (EC8 V) (CONVERTED LAB)
Chloride: 104
Hemoglobin: 11.6 — ABNORMAL LOW
Operator id: 151321
Potassium: 3.6

## 2011-01-22 LAB — POCT I-STAT CREATININE: Creatinine, Ser: 1.1

## 2011-02-05 ENCOUNTER — Ambulatory Visit: Payer: Medicare Other | Admitting: Cardiology

## 2011-03-04 ENCOUNTER — Ambulatory Visit (INDEPENDENT_AMBULATORY_CARE_PROVIDER_SITE_OTHER): Payer: Medicare Other | Admitting: Cardiology

## 2011-03-04 ENCOUNTER — Encounter: Payer: Self-pay | Admitting: Cardiology

## 2011-03-04 VITALS — BP 126/82 | HR 88 | Wt 184.0 lb

## 2011-03-04 DIAGNOSIS — I272 Pulmonary hypertension, unspecified: Secondary | ICD-10-CM

## 2011-03-04 DIAGNOSIS — D869 Sarcoidosis, unspecified: Secondary | ICD-10-CM

## 2011-03-04 DIAGNOSIS — I2789 Other specified pulmonary heart diseases: Secondary | ICD-10-CM

## 2011-03-04 LAB — BASIC METABOLIC PANEL
CO2: 30 mEq/L (ref 19–32)
Calcium: 9.6 mg/dL (ref 8.4–10.5)
Creatinine, Ser: 0.9 mg/dL (ref 0.4–1.2)
GFR: 87.22 mL/min (ref >60.00)

## 2011-03-04 NOTE — Assessment & Plan Note (Signed)
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 Iloprost was started around 9/11 and uptitrated. Given her parenchymal lung disease, it should target ventilated segments of the lung and limit greater V/Q mismatching. She has felt better symptomatically (still class III symptoms though). I am going to continue iloprost (now 5 micrograms 6 x a day) and Revatio. She is very compliant with iloprost dosing. Will get BMET today (on Lasix every other day) given occasional cramping to make sure that K is not low. Volume status looks good. Right heart cath in 8/12 showed improved PA pressure and mildly improved cardiac index.  PA pressure and PVR was still high, however.  I am not going to change her PAH medications as I think a significant portion of her PAH is due to lung parenchymal damage.  I do not think that we would be likely to get much more improvement with advancing her pulmonary vasodilators.

## 2011-03-04 NOTE — Patient Instructions (Signed)
Your physician recommends that you have  lab work today--BMET    Your physician recommends that you schedule a follow-up appointment in: 4 months with Dr Aundra Dubin.

## 2011-03-04 NOTE — Progress Notes (Signed)
PCP: Dr. 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 She is now on Revatio 80 mg tid and and inhaled Iloprost. She is being closely monitored by [facility] and the Iloprost dose has been increased to 5 mcg 6 x daily.  She has been evaluated for heart/lung transplant at [facility] as an inpatient but needed to raise around $10,000 to continue evaluation and has decided against this for now.  She was in the hospital in 4/12 with carbon monoxide poisoning.  She has been seeing [name], and sarcoidosis seems to be quiescent.   Patient is feeling symptomatically better on Iloprost. She is able to walk around her house without dyspnea.  She is able to do more housework. Quality of life is much better. No chest pain. No syncope/presyncope.  Weight is down 1 lb since I last saw her.  Main complaint is some periodic cramping in her legs.  Magnesium level was recently checked and was normal.  I repeated a RHC in 8/12.  PA pressure was lower than prior though still elevated.  Cardiac index was a bit higher.   Labs (11/10): BNP 91, creatinine 1.0, K 4  Labs (2/11): K 4.6, creatinine 0.86  Labs (3/11): BNP 58, K 4.2, creatinine 0.8  Labs (10/11): K 4.5, creatinine 0.8, BNP 76   Allergies (verified):  1) ! * Steroids High Dose  2) Demerol  3) Ultram  4) Darvocet-N 100  5) Doxycycline   Past Medical History:  1. Sarcoidosis: Stage IV pulmonary sarcoid on home O2.  Has been quiescent recently.  She has been referred to [facility] for lung transplant evaluation.  2. Cardiomyopathy: Cardiac MRI in 2004 with no evidence for infiltrative disease but EF 40%. Echo (9/10): EF 60%, severely dilated RV with moderately decreased systolic function, moderate RAE, PASP 83 mmHg.  Echo (1/12): EF 85%, grade I diastolic dysfunction, moderate biatrial enlargement, moderate RV dilation with normal RV systolic function, peak RV-RA gradient 34 mmHg.    3. Pulmonary hypertension with RV failure: Moderate to severe, likely secondary to sarcoidosis and parenchymal lung disease/hypoxic pulmonary vasoconstriction. RHC 5/10 showed PA 67/30 (mean 46) with mean PCWP 6 mmHg. Patient started on Revatio in 5/10 without any change in oxygen saturation. 6 minute walk 7/10: 61 m. Echo (9/10): EF 60%, severely dilated RV with moderately decreased systolic function, moderate RAE, PASP 83 mmHg. RHC (9/10) with mean RA 15, PA 74/36, CI 2.8. Repeat RHC after diuresis with mean RA 3, PA 60/22, mean PCWP 4. Echo (1/11): EF 88-50%, grade I diastolic dysfunction, moderately dilated RV, mild to moderate RV dysfunction, moderate to severe TR, PASP 58 mmHg. 6 minute walk (2/11): 122 m. 6 min walk (6/11): 161.5 m. RHC (6/11): mean RA 11, RV 64/15, PA 63/28 mean 43, mean PCWP 14, CI 2.4 thermodilution and 3.2 Fick, PVR 5.3 WU Fick and 7.25 WU thermodilution. Iloprost begun. 6 min walk (10/11): 158 m. 6 min walk (1/12) 273 m.  6 min walk (7/12) 248 m.  RHC (8/12): mean RA 2, PA 51/24, mean PA 35, mean PCWP 6, CI 3, PVR 5.1 WU.  4. 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 Device is now nearing ERI. Given end stage lung disease and normalization of LV function, the device will not be replaced and tachy therapies were turned off.  5. GERD  6. Chronic chest pain. LHC (5/10) with no angiographic coronary disease.  7. Allergic  rhinitis,  8. h/o iron deficiency anemia.  9. H/o secondary amenorrhea/irregular menses,  10. Psychosis with high dose steroids.  11. Possible runs of SVT  12. Recurrent Hypokalemia  13. h/o Rotator cuff sprain  14. 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    Current Outpatient Prescriptions  Medication Sig Dispense Refill  . acetaminophen (TYLENOL) 325 MG tablet Take 650 mg by mouth as needed.        Marland Kitchen albuterol (PROAIR HFA) 108 (90 BASE) MCG/ACT inhaler Inhale 2 puffs into the lungs 4 (four) times daily.  8.5 g  prn  . aspirin 81 MG tablet Take 81 mg by mouth daily.        . carvedilol (COREG) 3.125 MG tablet Take 1 tablet (3.125 mg total) by mouth 2 (two) times daily.  180 tablet  1  . esomeprazole (NEXIUM) 40 MG capsule Take 1 capsule (40 mg total) by mouth daily before breakfast.  90 capsule  3  . fluticasone (FLOVENT HFA) 110 MCG/ACT inhaler Take one to two puffs two times a day       . furosemide (LASIX) 40 MG tablet 1 every other day      . Iloprost (VENTAVIS) 20 MCG/ML SOLN Inhale into the lungs 6 (six) times daily.        . potassium chloride SA (K-DUR,KLOR-CON) 20 MEQ tablet 2 tabs am and 1 tab po qhs      . risperiDONE (RISPERDAL) 0.5 MG tablet Take 1 tablet (0.5 mg total) by mouth at bedtime.  90 tablet  1  . sildenafil (REVATIO) 20 MG tablet take 4  tablets every 8 hours         BP 126/82  Pulse 88  Wt 83.462 kg (184 lb) General: Well-developed,well-nourished,in no acute distress; O2 applied via nasal cannula  Neck: Neck supple, JVP 7 cm. No masses, thyromegaly or abnormal cervical nodes.  Lungs: Clear bilaterally to auscultation and percussion.  Heart: Non-displaced PMI, chest non-tender; regular rate and rhythm, S1, S2 without rubs or gallops. 1/6 systolic murmur LLSB. Loud P2. Carotid upstroke normal, no bruit. Pedals normal pulses. No edema.  Abdomen: Bowel sounds positive; abdomen soft and non-tender without masses, organomegaly, or hernias noted. No hepatosplenomegaly.  Extremities: No clubbing or cyanosis.  Neurologic: Alert and oriented x 3.  Psych: Normal affect.

## 2011-03-04 NOTE — Assessment & Plan Note (Signed)
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  No longer active inflammatory sarcoid, however.

## 2011-03-25 ENCOUNTER — Ambulatory Visit (INDEPENDENT_AMBULATORY_CARE_PROVIDER_SITE_OTHER): Payer: Medicare Other | Admitting: Family Medicine

## 2011-03-25 ENCOUNTER — Encounter: Payer: Self-pay | Admitting: Family Medicine

## 2011-03-25 DIAGNOSIS — A084 Viral intestinal infection, unspecified: Secondary | ICD-10-CM

## 2011-03-25 DIAGNOSIS — A088 Other specified intestinal infections: Secondary | ICD-10-CM

## 2011-03-25 MED ORDER — RISPERIDONE 0.5 MG PO TABS: 0.5000 mg | ORAL_TABLET | Freq: Every day | ORAL | Status: DC

## 2011-03-25 MED ORDER — POTASSIUM CHLORIDE CRYS ER 20 MEQ PO TBCR: EXTENDED_RELEASE_TABLET | ORAL | Status: DC

## 2011-03-25 MED ORDER — FUROSEMIDE 40 MG PO TABS: ORAL_TABLET | ORAL | Status: DC

## 2011-03-25 MED ORDER — CARVEDILOL 3.125 MG PO TABS: 3.1250 mg | ORAL_TABLET | Freq: Two times a day (BID) | ORAL | Status: DC

## 2011-03-25 MED ORDER — FLUTICASONE PROPIONATE HFA 110 MCG/ACT IN AERO: 2.0000 | INHALATION_SPRAY | Freq: Two times a day (BID) | RESPIRATORY_TRACT | Status: DC

## 2011-03-25 MED ORDER — ESOMEPRAZOLE MAGNESIUM 40 MG PO CPDR: 40.0000 mg | DELAYED_RELEASE_CAPSULE | Freq: Every day | ORAL | Status: DC

## 2011-03-25 MED ORDER — ALBUTEROL SULFATE HFA 108 (90 BASE) MCG/ACT IN AERS: 2.0000 | INHALATION_SPRAY | Freq: Four times a day (QID) | RESPIRATORY_TRACT | Status: DC

## 2011-04-03 DIAGNOSIS — A084 Viral intestinal infection, unspecified: Secondary | ICD-10-CM | POA: Insufficient documentation

## 2011-04-03 NOTE — Progress Notes (Signed)
  Subjective:    Patient ID: Rachel [name], female    DOB: 12/26/1960, 50 y.o.   MRN: [id number]  HPI Here today for    1.  GI symptoms: Rachel [name] is a 50 y.o. female with complaint of gastrointestinal symptoms of watery diarrhea, lower abdominal cramps, nausea for 3 days. No blood in stool.  Symptoms improving, following brat diet.  Also needs multiple medication refills. Tolerating meds well without side effects.      Review of Systems     Objective:   Physical Exam Gen: On O2 at regular home flow rate,patient appears well. Hydration status: well hydrated.   Abdomen: abdomen is soft without significant tenderness, masses, organomegaly or guarding..       Assessment & Plan:

## 2011-04-03 NOTE — Assessment & Plan Note (Signed)
I have recommended small amounts clear fluids frequently, 'BRAT' diet and advance diet as tolerated. Return office visit if symptoms persist or worsen; I have alerted the patient to call if high fever, dehydration, marked weakness, fainting, increased abdominal pain, blood in stool or vomit.

## 2011-04-15 ENCOUNTER — Encounter: Payer: Self-pay | Admitting: Family Medicine

## 2011-04-15 ENCOUNTER — Telehealth: Payer: Self-pay | Admitting: Internal Medicine

## 2011-04-15 NOTE — Telephone Encounter (Signed)
I spoke with pt is wanting to switch from [name] to Dr. Lamonte Sakai. Pt states the reason is bc she was advised that [name] was not familiar with Ventavis and that Dr. Lamonte Sakai knew more about it. Please advise [name] if you are okay with the switch. thanks

## 2011-04-15 NOTE — Progress Notes (Signed)
Patient ID: Rachel [name], female   DOB: 11/12/1960, 51 y.o.   MRN: [id number] Documentation completed for Medical Alert Certificate for [employer].  Labeled and placed to mailed to her to return to [employer]

## 2011-04-15 NOTE — Telephone Encounter (Signed)
Per CY-okay with him for patient to change MD's.

## 2011-04-15 NOTE — Telephone Encounter (Signed)
Please advise if you are okay with the switch Dr. Lamonte Sakai, thanks

## 2011-04-22 NOTE — Telephone Encounter (Signed)
Yes this is ok

## 2011-04-22 NOTE — Telephone Encounter (Signed)
Pt has appt with RB on 05/23/11 @ 2:15pm and told to arrive at 2pm. Pt verbalized understanding.

## 2011-05-17 ENCOUNTER — Telehealth: Payer: Self-pay | Admitting: Cardiology

## 2011-05-17 NOTE — Telephone Encounter (Signed)
Pt calling on status of form faxed to [place] re auth on med revatio, there fax number [phone], pls cal pt

## 2011-05-20 NOTE — Telephone Encounter (Signed)
LM for pt that form was faxed 05/15/11 and again this morning 05/20/11.

## 2011-05-20 NOTE — Telephone Encounter (Signed)
This form was originally faxed 05/15/11 and refaxed today. LMTCB for pt to let her know.

## 2011-05-20 NOTE — Telephone Encounter (Signed)
Fu call Patient returning your call

## 2011-05-20 NOTE — Telephone Encounter (Signed)
Will forward to Dr. Claris Gladden nurse.

## 2011-05-23 ENCOUNTER — Ambulatory Visit (INDEPENDENT_AMBULATORY_CARE_PROVIDER_SITE_OTHER): Payer: Medicare HMO | Admitting: Emergency Medicine

## 2011-05-23 ENCOUNTER — Encounter: Payer: Self-pay | Admitting: Emergency Medicine

## 2011-05-23 DIAGNOSIS — D869 Sarcoidosis, unspecified: Secondary | ICD-10-CM

## 2011-05-23 DIAGNOSIS — I2789 Other specified pulmonary heart diseases: Secondary | ICD-10-CM

## 2011-05-23 NOTE — Assessment & Plan Note (Addendum)
CXR next time to follow parenchymal disease.  Stop Flovent to see if she tolerates - will restart if she misses it

## 2011-05-23 NOTE — Progress Notes (Signed)
Subjective:    Patient ID: Rachel [name], female    DOB: 1960/04/29, 51 y.o.   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 She is now on Revatio 80 mg tid and and inhaled Iloprost (added in 2011), managed by Dr Aundra Dubin at [facility]. 6 min walk (7/12) 248 m. RHC (8/12): mean RA 2, PA 51/24, mean PA 35, mean PCWP 6, CI 3, PVR 5.1 WU. She believes that she is doing very well. She wears O2 at 6L/min at all times. She believes she knows when her sarcoidosis is active, remains on flovent.    Review of Systems  Constitutional: Negative.  Negative for fever and unexpected weight change.  HENT: Positive for ear pain and congestion. Negative for nosebleeds, sore throat, rhinorrhea, sneezing, trouble swallowing, dental problem, postnasal drip and sinus pressure.   Eyes: Negative.  Negative for redness and itching.  Respiratory: Negative.  Negative for cough, chest tightness, shortness of breath and wheezing.   Cardiovascular: Negative.  Negative for palpitations and leg swelling.  Gastrointestinal: Negative.  Negative for nausea and vomiting.  Genitourinary: Negative.  Negative for dysuria.  Musculoskeletal: Positive for arthralgias. Negative for joint swelling.  Skin: Negative for rash.  Neurological: Negative.  Negative for headaches.  Hematological: Negative.  Does not bruise/bleed easily.  Psychiatric/Behavioral: Negative.  Negative for dysphoric mood. The patient is not nervous/anxious.     Past Medical History  Diagnosis Date  . GERD (gastroesophageal reflux disease)   . Hypertension   . 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 CHF (congestive heart failure)     Right side- Dr. Aundra Dubin ([facility])  . Pulmonary HTN   . Anxiety   . Cardiomyopathy   . NSVT (nonsustained ventricular tachycardia)     with syncope: St Jude ICD was implanted. Device now nearing ERI. Given end stage lung disease and normalization of LV  function, the device will not be replaced and tachy therapies were turned off  . Chronic chest pain     LHC (08/2008) with no angiographic coronary diease  . Allergic rhinitis   . H/O: iron deficiency anemia   . Secondary amenorrhea     irregular menses  . Psychosis     with high dose steroids  . SVT (supraventricular tachycardia)     possible runs  . Hypokalemia     recurrent  . Rotator cuff sprain   . Chronic back pain      Family History  Problem Relation Age of Onset  . Lung cancer Father   . Cancer Father     lung cancer  . Hypertension    . Diabetes Mother     border line  . Hypertension Mother   . Heart failure Mother      History   Social History  . Marital Status: Divorced    Spouse Name: N/A    Number of Children: N/A  . Years of Education: N/A   Occupational History  . Not on file.   Social History Main Topics  . Smoking status: Former Smoker -- 1.5 packs/day for 11 years    Types: Cigarettes    Quit date: 04/08/1992  . Smokeless tobacco: Not on file   Comment: 1ppd x 20 years  . Alcohol Use: Yes     remote history of alcohol abuse (quit 1994)  . Drug Use: No  . Sexually Active: Not on file   Other  Topics Concern  . Not on file   Social History Narrative  . No narrative on file     Allergies  Allergen Reactions  . Ace Inhibitors   . Meperidine Hcl     REACTION: Makes her feel funny  . Other     High Dose Steroids  . Propoxyphene N-Acetaminophen     REACTION: rash: pruritus  . Tramadol Hcl     REACTION: nausea, lightheadedness, sleepiness, and dizziness     Outpatient Prescriptions Prior to Visit  Medication Sig Dispense Refill  . acetaminophen (TYLENOL) 325 MG tablet Take 650 mg by mouth as needed.        Marland Kitchen albuterol (PROAIR HFA) 108 (90 BASE) MCG/ACT inhaler Inhale 2 puffs into the lungs 4 (four) times daily.  8.5 g  6  . aspirin 81 MG tablet Take 81 mg by mouth daily.        . carvedilol (COREG) 3.125 MG tablet Take 1 tablet  (3.125 mg total) by mouth 2 (two) times daily with a meal.  180 tablet  2  . esomeprazole (NEXIUM) 40 MG capsule Take 1 capsule (40 mg total) by mouth daily before breakfast.  90 capsule  2  . fluticasone (FLOVENT HFA) 110 MCG/ACT inhaler Inhale 2 puffs into the lungs 2 (two) times daily. Take one to two puffs two times a day  1 Inhaler  2  . furosemide (LASIX) 40 MG tablet 1 tab every other day  45 tablet  2  . Iloprost (VENTAVIS) 20 MCG/ML SOLN Inhale into the lungs 6 (six) times daily.        . potassium chloride SA (K-DUR,KLOR-CON) 20 MEQ tablet 2 tabs am and 1 tab po qhs  270 tablet  2  . risperiDONE (RISPERDAL) 0.5 MG tablet Take 1 tablet (0.5 mg total) by mouth at bedtime.  90 tablet  2  . sildenafil (REVATIO) 20 MG tablet take 4  tablets every 8 hours               Objective:   Physical Exam  Gen: Pleasant, overwt, in no distress on O2,  normal affect  ENT: No lesions,  mouth clear,  oropharynx clear, no postnasal drip  Neck: No JVD, no TMG, no carotid bruits  Lungs: No use of accessory muscles, few B insp crackles  Cardiovascular: RRR, prominent P2  Musculoskeletal: No deformities, no cyanosis or clubbing  Neuro: alert, non focal  Skin: Warm, no lesions or rashes     Assessment & Plan:  PULMONARY HYPERTENSION Appears to be stable (IMPROVED) on good regimen for secondary PAH. Need to follow her clinical progression, 6 min walk and PAP's. Agree that she is good candidate for heart/lung txp  Please continue your oxygen at all times Continue your ventavis and revatio as you are taking them We will repeat your CXR this Summer We will repeat your 6 minute walk this Summer You will need either a repeat echocardiogram OR a right heart catherization this Summer. We will discuss with Dr Aundra Dubin to decide which will be the most useful study.  Call our office if you develop any problems with your breathing Follow with Dr Lamonte Sakai in 4 months or sooner if you have any  problems  PULMONARY SARCOIDOSIS CXR next time to follow parenchymal disease.  Stop Flovent to see if she tolerates - will restart if she misses it

## 2011-05-23 NOTE — Assessment & Plan Note (Signed)
Appears to be stable (IMPROVED) on good regimen for secondary PAH. Need to follow her clinical progression, 6 min walk and PAP's. Agree that she is good candidate for heart/lung txp  Please continue your oxygen at all times Continue your ventavis and revatio as you are taking them We will repeat your CXR this Summer We will repeat your 6 minute walk this Summer You will need either a repeat echocardiogram OR a right heart catherization this Summer. We will discuss with Dr Aundra Dubin to decide which will be the most useful study.  Call our office if you develop any problems with your breathing Follow with Dr Lamonte Sakai in 4 months or sooner if you have any problems

## 2011-05-23 NOTE — Patient Instructions (Addendum)
Please continue your oxygen at all times Continue your ventavis and revatio as you are taking them Stop Flovent to see if you tolerate this.  We will repeat your CXR this Summer We will repeat your 6 minute walk this Summer You will need either a repeat echocardiogram OR a right heart catherization this Summer. We will discuss with Dr Aundra Dubin to decide which will be the most useful study.  Call our office if you develop any problems with your breathing Follow with Dr Lamonte Sakai in 4 months or sooner if you have any problems.

## 2011-05-27 ENCOUNTER — Telehealth: Payer: Self-pay | Admitting: Cardiology

## 2011-05-27 NOTE — Telephone Encounter (Signed)
I talked with pt and [employer]. Form completed with name of medication checked ( revatio) checked and faxed back to [employer] today.

## 2011-05-27 NOTE — Telephone Encounter (Signed)
Fu call Pt was calling about this issue also. Pt said that he  needs to fax paperwork back with name of med on  paperwork

## 2011-05-27 NOTE — Telephone Encounter (Signed)
[facility] calling about pt assistance program for revatio. Please call back

## 2011-06-11 NOTE — Telephone Encounter (Signed)
error

## 2011-06-28 ENCOUNTER — Ambulatory Visit (INDEPENDENT_AMBULATORY_CARE_PROVIDER_SITE_OTHER): Payer: Medicare HMO | Admitting: Family Medicine

## 2011-06-28 ENCOUNTER — Encounter: Payer: Self-pay | Admitting: Family Medicine

## 2011-06-28 VITALS — BP 125/72 | HR 78 | Ht 63.0 in | Wt 182.5 lb

## 2011-06-28 DIAGNOSIS — D869 Sarcoidosis, unspecified: Secondary | ICD-10-CM

## 2011-06-28 DIAGNOSIS — I10 Essential (primary) hypertension: Secondary | ICD-10-CM

## 2011-06-28 DIAGNOSIS — I2789 Other specified pulmonary heart diseases: Secondary | ICD-10-CM

## 2011-06-28 MED ORDER — POTASSIUM CHLORIDE CRYS ER 20 MEQ PO TBCR: EXTENDED_RELEASE_TABLET | ORAL | Status: DC

## 2011-06-28 MED ORDER — ALBUTEROL SULFATE HFA 108 (90 BASE) MCG/ACT IN AERS: 2.0000 | INHALATION_SPRAY | Freq: Four times a day (QID) | RESPIRATORY_TRACT | Status: DC

## 2011-06-28 MED ORDER — FUROSEMIDE 40 MG PO TABS: ORAL_TABLET | ORAL | Status: DC

## 2011-06-28 MED ORDER — ESOMEPRAZOLE MAGNESIUM 40 MG PO CPDR: 40.0000 mg | DELAYED_RELEASE_CAPSULE | Freq: Every day | ORAL | Status: DC

## 2011-06-28 MED ORDER — CARVEDILOL 3.125 MG PO TABS: 3.1250 mg | ORAL_TABLET | Freq: Two times a day (BID) | ORAL | Status: DC

## 2011-06-28 MED ORDER — RISPERIDONE 0.5 MG PO TABS: 0.5000 mg | ORAL_TABLET | Freq: Every day | ORAL | Status: DC

## 2011-06-28 NOTE — Patient Instructions (Signed)
Thank you for coming in today, it was good to see you I would like for you to make an appointment to have your physical within the next couple of months, we will do you pap at that time You are due for a colonoscopy as well.  Try to schedule soon.

## 2011-07-10 ENCOUNTER — Other Ambulatory Visit: Payer: Self-pay

## 2011-07-10 MED ORDER — ILOPROST 20 MCG/ML IN SOLN: 5.0000 ug | Freq: Every day | RESPIRATORY_TRACT | Status: DC

## 2011-07-10 NOTE — Telephone Encounter (Signed)
..  Requested Prescriptions   Signed Prescriptions Disp Refills  . Iloprost (VENTAVIS) 20 MCG/ML SOLN 45 mL 4    Sig: Inhale 0.25 mLs (5 mcg total) into the lungs 6 (six) times daily.    Authorizing Provider: Larey Dresser    Ordering User: Nyomi Howser M  per rn

## 2011-07-12 NOTE — Assessment & Plan Note (Signed)
Followed by Dr. Lamonte Sakai, stable.

## 2011-07-12 NOTE — Assessment & Plan Note (Addendum)
Doing well, followed by cards.  No changes in medications, refilled meds.

## 2011-07-12 NOTE — Progress Notes (Signed)
  Subjective:    Patient ID: Rachel [name], female    DOB: 1960/09/16, 51 y.o.   MRN: [id number]  HPI 1. HTN:  Doing well, followed by cardiology as well.  Needs refills on medications,tolerating well.  No changes in diet.  Denies chest pain, headache, nausea or vomiting, weakness.   2. Pulm HTN:  Recently switch pulmonologist, now followed by Dr. Lamonte Sakai.  Still on revatio and ventavis, requires home O2 at all times.  No increased shortness of breath since starting new medications.     Review of Systems     Objective:   Physical Exam  Constitutional: She is oriented to person, place, and time. She appears well-nourished. No distress.  Cardiovascular: Normal rate and regular rhythm.   Pulmonary/Chest: Effort normal and breath sounds normal.  Musculoskeletal: She exhibits no edema.  Neurological: She is alert and oriented to person, place, and time.          Assessment & Plan:

## 2011-07-16 ENCOUNTER — Telehealth: Payer: Self-pay | Admitting: Cardiology

## 2011-07-16 NOTE — Telephone Encounter (Signed)
[facility] calling about status of ventavis Please call them back

## 2011-07-16 NOTE — Telephone Encounter (Signed)
Spoke with Ailene Ravel at [phone]. They have received current prescription for Ventavis. They do not need anymore information from Dr Aundra Dubin.

## 2011-07-22 ENCOUNTER — Encounter: Payer: Self-pay | Admitting: Cardiology

## 2011-07-22 ENCOUNTER — Ambulatory Visit (INDEPENDENT_AMBULATORY_CARE_PROVIDER_SITE_OTHER): Payer: Medicare Other | Admitting: Cardiology

## 2011-07-22 VITALS — BP 150/85 | HR 71 | Ht 63.0 in | Wt 183.8 lb

## 2011-07-22 DIAGNOSIS — I2789 Other specified pulmonary heart diseases: Secondary | ICD-10-CM

## 2011-07-22 DIAGNOSIS — I272 Pulmonary hypertension, unspecified: Secondary | ICD-10-CM

## 2011-07-22 NOTE — Patient Instructions (Signed)
Your physician has requested that you have an echocardiogram. Echocardiography is a painless test that uses sound waves to create images of your heart. It provides your doctor with information about the size and shape of your heart and how well your heart's chambers and valves are working. This procedure takes approximately one hour. There are no restrictions for this procedure.  August 2013  Your physician recommends that you schedule a follow-up appointment in August with Dr Aundra Dubin after you have the echo.

## 2011-07-22 NOTE — Assessment & Plan Note (Signed)
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 Iloprost was started around 9/11 and uptitrated. Given her parenchymal lung disease, it should target ventilated segments of the lung and limit greater V/Q mismatching. She has felt better symptomatically (still class III symptoms though). I am going to continue iloprost (now 5 micrograms 6 x a day) and Revatio. She is very compliant with iloprost dosing.  Volume status looks good. Right heart cath in 8/12 showed improved PA pressure and mildly improved cardiac index.  PA pressure and PVR was still high, however.  I am not going to change her PAH medications as I think a significant portion of her PAH is due to lung parenchymal damage.  I do not think that we would be likely to get much more improvement with advancing her pulmonary vasodilators.  - Her knee hurts today so she does not want to do a 6 minute walk.  She is going to have this when she next sees  Dr. Lamonte Sakai.  - This year, it would be reasonable to do an echo rather than right heart cath for RV and PA pressure evaluation. I will arrange for 8/13.

## 2011-07-22 NOTE — Progress Notes (Signed)
PCP: Dr. 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 She is now on Revatio 80 mg tid and and inhaled Iloprost.  She has been evaluated for heart/lung transplant at [facility] as an inpatient but needed to raise around $10,000 to continue evaluation and has decided against this for now. She has started seeing Dr. Lamonte Sakai for her sarcoid.     Patient is feeling symptomatically better on Iloprost. She is able to walk around her house without dyspnea though she is still short of breath walking longer distances.  She is able to do more housework. Quality of life is much better. No chest pain. No syncope/presyncope.  BP is running mildly elevated today.  She reports being under stress with a lot of company at her house.   Labs (11/10): BNP 91, creatinine 1.0, K 4  Labs (2/11): K 4.6, creatinine 0.86  Labs (3/11): BNP 58, K 4.2, creatinine 0.8  Labs (10/11): K 4.5, creatinine 0.8, BNP 76   Allergies (verified):  1) ! * Steroids High Dose  2) Demerol  3) Ultram  4) Darvocet-N 100  5) Doxycycline   Past Medical History:  1. Sarcoidosis: Stage IV pulmonary sarcoid on home O2.  Has been quiescent recently.  She has been referred to [facility] for lung transplant evaluation.  2. Cardiomyopathy: Cardiac MRI in 2004 with no evidence for infiltrative disease but EF 40%. Echo (9/10): EF 60%, severely dilated RV with moderately decreased systolic function, moderate RAE, PASP 83 mmHg.  Echo (1/12): EF 70%, grade I diastolic dysfunction, moderate biatrial enlargement, moderate RV dilation with normal RV systolic function, peak RV-RA gradient 34 mmHg.  3. Pulmonary hypertension with RV failure: Moderate to severe, likely secondary to sarcoidosis and parenchymal lung disease/hypoxic pulmonary vasoconstriction. RHC 5/10 showed PA 67/30 (mean 46) with mean PCWP 6 mmHg. Patient started on Revatio in 5/10 without any change in oxygen  saturation. 6 minute walk 7/10: 61 m. Echo (9/10): EF 60%, severely dilated RV with moderately decreased systolic function, moderate RAE, PASP 83 mmHg. RHC (9/10) with mean RA 15, PA 74/36, CI 2.8. Repeat RHC after diuresis with mean RA 3, PA 60/22, mean PCWP 4. Echo (1/11): EF 26-37%, grade I diastolic dysfunction, moderately dilated RV, mild to moderate RV dysfunction, moderate to severe TR, PASP 58 mmHg. 6 minute walk (2/11): 122 m. 6 min walk (6/11): 161.5 m. RHC (6/11): mean RA 11, RV 64/15, PA 63/28 mean 43, mean PCWP 14, CI 2.4 thermodilution and 3.2 Fick, PVR 5.3 WU Fick and 7.25 WU thermodilution. Iloprost begun. 6 min walk (10/11): 158 m. 6 min walk (1/12) 273 m.  6 min walk (7/12) 248 m.  RHC (8/12): mean RA 2, PA 51/24, mean PA 35, mean PCWP 6, CI 3, PVR 5.1 WU.  4. 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 Device is now nearing ERI. Given end stage lung disease and normalization of LV function, the device will not be replaced and tachy therapies were turned off.  5. GERD  6. Chronic chest pain. LHC (5/10) with no angiographic coronary disease.  7. Allergic rhinitis,  8. h/o iron deficiency anemia.  9. H/o secondary amenorrhea/irregular menses,  10. Psychosis with high dose steroids.  11. Possible runs of SVT  12. Recurrent Hypokalemia  13. h/o Rotator cuff sprain  14. 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   ROS: All systems reviewed and negative except as per HPI.    Current Outpatient Prescriptions  Medication Sig Dispense Refill  . acetaminophen (TYLENOL) 325 MG  tablet Take 650 mg by mouth as needed.        Marland Kitchen albuterol (PROAIR HFA) 108 (90 BASE) MCG/ACT inhaler Inhale 2 puffs into the lungs 4 (four) times daily.  8.5 g  6  . aspirin 81 MG tablet Take 81 mg by mouth daily.        . carvedilol (COREG) 3.125 MG tablet Take 1 tablet (3.125 mg total) by mouth 2 (two) times daily with a meal.  180 tablet  2  . esomeprazole (NEXIUM) 40 MG capsule Take 1 capsule (40 mg total) by mouth daily before breakfast.  90 capsule  2  . Iloprost (VENTAVIS) 20 MCG/ML SOLN Inhale 0.25 mLs (5 mcg total) into the lungs 6 (six) times daily.  45 mL  4  . potassium chloride SA (K-DUR,KLOR-CON) 20 MEQ tablet 2 tabs am and 1 tab po qhs  270 tablet  2  . risperiDONE (RISPERDAL) 0.5 MG tablet Take 1 tablet (0.5 mg total) by mouth at bedtime.  90 tablet  2  . sildenafil (REVATIO) 20 MG tablet take 4  tablets every 8 hours         BP 150/85  Pulse 71  Ht _0  (1.6 m)  Wt 183 lb 12.8 oz (83.371 kg)  BMI 32.56 kg/m2 General: Well-developed,well-nourished,in no acute distress; O2 applied via nasal cannula  Neck: Neck supple, JVP 7 cm. No masses, thyromegaly or abnormal cervical nodes.  Lungs: Clear bilaterally to auscultation and percussion.  Heart: Non-displaced PMI, chest non-tender; regular rate and rhythm, S1, S2 without rubs or gallops. 1/6 systolic murmur LLSB. Loud P2. Carotid upstroke normal, no bruit. Pedals normal pulses. No edema.  Abdomen: Bowel sounds positive; abdomen soft and non-tender without masses, organomegaly, or hernias noted. No hepatosplenomegaly.  Extremities: No clubbing or cyanosis.  Neurologic: Alert and oriented x 3.  Psych: Normal affect.

## 2011-08-02 ENCOUNTER — Telehealth: Payer: Self-pay | Admitting: Family Medicine

## 2011-08-02 NOTE — Telephone Encounter (Signed)
Patient wants to know what dentist she can go to that her medical history will be disclosed to and who can accomodate her oxygen needs as well. She is currently experiencing pain on the left side and want to see a dentist as soon as possible.

## 2011-08-05 ENCOUNTER — Telehealth: Payer: Self-pay | Admitting: Cardiology

## 2011-08-05 ENCOUNTER — Telehealth: Payer: Self-pay | Admitting: *Deleted

## 2011-08-05 DIAGNOSIS — K047 Periapical abscess without sinus: Secondary | ICD-10-CM

## 2011-08-05 NOTE — Telephone Encounter (Signed)
Spoke with pt and she is aware Dr Aundra Dubin will refer her to Dr Enrique Sack.   LMTCB at Dr Ritta Slot office to make referral [phone].

## 2011-08-05 NOTE — Telephone Encounter (Signed)
New msg Pt wants to talk to you and wouldn't tell my why.

## 2011-08-05 NOTE — Telephone Encounter (Signed)
Called pt. Pt reports, that there is a dentist at [facility] who is specialized with pt's of chronic conditions. She needs Dr.Matthews to set her up with him. Dr.Kolinski? She also request for Dr.Matthews to call her back, because he knows her condition and she needs someone who understands her condition. [place] [phone] and left message to call us back. Look on line, they take [place]. I don't know, if they take Medicare Patients? Waiting for call back. Fwd. To Dr.Matthews .Rachel [name]

## 2011-08-05 NOTE — Telephone Encounter (Signed)
Pt. With medicare.  Please let her know that she will need to call around to dental offices to see which one can accommodate her.  I would expect most dentists can accommodate her O2 needs.

## 2011-08-05 NOTE — Telephone Encounter (Signed)
Spoke with Rachel [name]. Rachel [name] states she has a swelling on the left side of her upper jaw since Friday. She thinks it is an abcessed tooth that needs to be extracted. She does not have a dentist. She is familiar with Dr Lawana Chambers at [facility]. She called Dr Bruna Potter office today and  was told by Dr Bruna Potter office he would see her if she had a referral from Dr Aundra Dubin. I will forward to Dr Aundra Dubin for review.

## 2011-08-05 NOTE — Telephone Encounter (Signed)
Per Dr Maceo Pro to refer to Dr Enrique Sack.

## 2011-08-06 NOTE — Telephone Encounter (Signed)
Spoke with Tamela Oddi at Dr [employer] office. She has been in touch with pt about scheduling an appt for pt with Dr Enrique Sack.

## 2011-08-12 NOTE — Telephone Encounter (Signed)
See previous notes. Javier Glazier, Gerrit Heck

## 2011-08-22 ENCOUNTER — Ambulatory Visit (HOSPITAL_COMMUNITY): Payer: Self-pay | Admitting: Dentistry

## 2011-08-22 ENCOUNTER — Encounter (HOSPITAL_COMMUNITY): Payer: Self-pay | Admitting: Dentistry

## 2011-08-22 VITALS — BP 125/81 | HR 75 | Temp 97.7°F

## 2011-08-22 DIAGNOSIS — M264 Malocclusion, unspecified: Secondary | ICD-10-CM

## 2011-08-22 DIAGNOSIS — K045 Chronic apical periodontitis: Secondary | ICD-10-CM

## 2011-08-22 DIAGNOSIS — I428 Other cardiomyopathies: Secondary | ICD-10-CM

## 2011-08-22 DIAGNOSIS — K0401 Reversible pulpitis: Secondary | ICD-10-CM

## 2011-08-22 DIAGNOSIS — Z01818 Encounter for other preprocedural examination: Secondary | ICD-10-CM

## 2011-08-22 DIAGNOSIS — K08109 Complete loss of teeth, unspecified cause, unspecified class: Secondary | ICD-10-CM

## 2011-08-22 DIAGNOSIS — K036 Deposits [accretions] on teeth: Secondary | ICD-10-CM

## 2011-08-22 DIAGNOSIS — J984 Other disorders of lung: Secondary | ICD-10-CM

## 2011-08-22 DIAGNOSIS — K029 Dental caries, unspecified: Secondary | ICD-10-CM

## 2011-08-22 DIAGNOSIS — K0530 Chronic periodontitis, unspecified: Secondary | ICD-10-CM

## 2011-08-22 MED ORDER — AMOXICILLIN 500 MG PO CAPS: ORAL_CAPSULE | ORAL | Status: DC

## 2011-08-22 MED ORDER — AMOXICILLIN 500 MG PO CAPS: ORAL_CAPSULE | ORAL | Status: AC

## 2011-08-22 NOTE — Progress Notes (Addendum)
DENTAL CONSULTATION  Date of Consultation:  08/22/2011 Patient Name:   Rachel [name] Date of Birth:   [birth year] Medical Record Number: [id number]  VITALS: BP 125/81  Pulse 75  Temp(Src) 97.7 F (36.5 C) (Oral)   CHIEF COMPLAINT: Patient referred for a pre-heart and lung transplant dental protocol evaluation.  HPI: Rachel [name] is a 51 year old female referred by Dr. [name] for a pre-heart and lung transplant dental protocol evaluation.  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 Patient also had a significant cardiomyopathy. Patient has been referred to [facility] for a lung and heart transplant evaluation. Patient now seen as part of a pre-transplant dental protocol evaluation.  The patient has a history of toothache symptoms associated with upper left quadrant tooth #13. Patient describes the pain as being dull at times and sharp at times. Patient indicates that when it hurts it hurts for "hours at a time". Patient currently indicates that it is 9/10 in intensity. Patient indicates that taking her Tylenol does help relieve the pain. Patient indicates it's been hurting for many weeks.  Patient indicates that she last saw a dentist in 2007. Patient to Dr. Elbert Ewings at that time for an exam and cleaning. Patient has no partial dentures. Patient indicates that the upper right molar had a root canal therapy approximately 2004. No crown was ever fabricated for this tooth.   Patient Active Problem List  Diagnoses  . PULMONARY SARCOIDOSIS  . ANXIETY  . HYPERTENSION, BENIGN SYSTEMIC  . PULMONARY HYPERTENSION  . CARDIOMYOPATHY, IDIOPATHIC  . SINUS TACHYCARDIA  . CONGESTIVE HEART FAILURE, RIGHT  . GASTROESOPHAGEAL REFLUX, NO ESOPHAGITIS  . HYPOXEMIA  . Leg cramps  . Viral gastroenteritis   PMH: Past Medical History  Diagnosis Date  . GERD (gastroesophageal reflux disease)   . Hypertension   . 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 CHF  (congestive heart failure)     Right side- Dr. Aundra Dubin ([place])  . Pulmonary HTN   . Anxiety   . Cardiomyopathy   . NSVT (nonsustained ventricular tachycardia)     with syncope: St Jude ICD was implanted. Device now nearing ERI. Given end stage lung disease and normalization of LV function, the device will not be replaced and tachy therapies were turned off  . Chronic chest pain     LHC (08/2008) with no angiographic coronary diease  . Allergic rhinitis   . H/O: iron deficiency anemia   . Secondary amenorrhea     irregular menses  . Psychosis     with high dose steroids  . SVT (supraventricular tachycardia)     possible runs  . Hypokalemia     recurrent  . Rotator cuff sprain   . Chronic back pain     PSH: Past Surgical History  Procedure Date  . Cardiac catheterization 08/26/2008    5% EF with normal coronary arteries and normal wall motion  . Adenosine cardiolyte 03/31/2003    no ischemia/infarct; marked global LV hypekinesis; resting EF 22%  . Cardiac catheterization 03/31/2003    globally depressed LV fxn with EF 20%; idiopathic dilated cardiomyopathy  . Defibrillator placed 03/31/2003    St. Jude single chamber (Dr. Cristopher Peru)  . Pfts     FVC 2000 (56%) FEV1 1400 (47%), ratio 0.7; insignif response to bronchodilatior; stable from 1/05 - 9/05; PFTs: Mod restriction, FVC 1.9L, FEV1 1.6L, both 53% predicted; slt response to brochodilators 04/08/2002  ALLERGIES: Allergies  Allergen Reactions  . Ace Inhibitors   . Meperidine Hcl     REACTION: Makes her feel funny  . Other     High Dose Steroids  . Propoxyphene-Acetaminophen     REACTION: rash: pruritus  . Tramadol Hcl     REACTION: nausea, lightheadedness, sleepiness, and dizziness    MEDICATIONS: Current Outpatient Prescriptions  Medication Sig Dispense Refill  . acetaminophen (TYLENOL) 325 MG tablet Take 650 mg by mouth as needed.        Marland Kitchen albuterol (PROAIR HFA) 108 (90 BASE) MCG/ACT inhaler Inhale 2 puffs  into the lungs 4 (four) times daily.  8.5 g  6  . aspirin 81 MG tablet Take 81 mg by mouth daily.        . carvedilol (COREG) 3.125 MG tablet Take 1 tablet (3.125 mg total) by mouth 2 (two) times daily with a meal.  180 tablet  2  . esomeprazole (NEXIUM) 40 MG capsule Take 1 capsule (40 mg total) by mouth daily before breakfast.  90 capsule  2  . Iloprost (VENTAVIS) 20 MCG/ML SOLN Inhale 0.25 mLs (5 mcg total) into the lungs 6 (six) times daily.  45 mL  4  . potassium chloride SA (K-DUR,KLOR-CON) 20 MEQ tablet 2 tabs am and 1 tab po qhs  270 tablet  2  . risperiDONE (RISPERDAL) 0.5 MG tablet Take 1 tablet (0.5 mg total) by mouth at bedtime.  90 tablet  2  . sildenafil (REVATIO) 20 MG tablet take 4  tablets every 8 hours         LABS: Lab Results  Component Value Date   WBC 7.1 11/06/2010   HGB 11.6* 11/06/2010   HCT 34.9* 11/06/2010   MCV 89.4 11/06/2010   PLT 216.0 11/06/2010      Component Value Date/Time   NA 142 03/04/2011 1451   K 4.4 03/04/2011 1451   CL 104 03/04/2011 1451   CO2 30 03/04/2011 1451   GLUCOSE 77 03/04/2011 1451   BUN 18 03/04/2011 1451   CREATININE 0.9 03/04/2011 1451   CALCIUM 9.6 03/04/2011 1451   GFRNONAA >60 07/27/2010 0500   GFRAA  Value: >60        The eGFR has been calculated using the MDRD equation. This calculation has not been validated in all clinical situations. eGFR's persistently <60 mL/min signify possible Chronic Kidney Disease. 07/27/2010 0500   Lab Results  Component Value Date   INR 1.1* 11/06/2010   INR 1.2 12/11/2008   INR 1.3 08/26/2008   No results found for this basename: PTT    SOCIAL HISTORY: History   Social History  . Marital Status: Divorced    Spouse Name: N/A    Number of Children: N/A  . Years of Education: N/A   Occupational History  . Not on file.   Social History Main Topics  . Smoking status: Former Smoker -- 1.5 packs/day for 11 years    Types: Cigarettes    Quit date: 04/08/1992  . Smokeless tobacco: Not on file    Comment: 1ppd x 20 years  . Alcohol Use: Yes     remote history of alcohol abuse (quit 1994)  . Drug Use: No  . Sexually Active: Not on file   Other Topics Concern  . Not on file   Social History Narrative  . No narrative on file    FAMILY HISTORY: Family History  Problem Relation Age of Onset  . Lung cancer Father   .  Cancer Father     lung cancer  . Hypertension    . Diabetes Mother     border line  . Hypertension Mother   . Heart failure Mother      REVIEW OF SYSTEMS: Reviewed with patient today and positive as above.  DENTAL HISTORY: CHIEF COMPLAINT: Patient referred for a pre-heart and lung transplant dental protocol evaluation.  HPI: LAURAN ROMANSKI is a 51 year old female referred by Dr. [name] for a pre-heart and lung transplant dental protocol evaluation.  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 Patient also had a significant cardiomyopathy. Patient has been referred to [facility] for a lung and heart transplant evaluation. Patient now seen as part of a pre-transplant dental protocol evaluation.  The patient has a history of toothache symptoms associated with upper left quadrant tooth #13. Patient describes the pain as being dull at times and sharp at times. Patient indicates that when it hurts it hurts for "hours at a time". Patient currently indicates that it is 9/10 in intensity. Patient indicates that taking her Tylenol does help relieve the pain. Patient indicates it's been hurting for many weeks.  Patient indicates that she last saw a dentist in 2007. Patient to Dr. Elbert Ewings at that time for an exam and cleaning. Patient has no partial dentures. Patient indicates that the upper right molar had a root canal therapy approximately 2004. No crown was ever fabricated for this tooth.  DENTAL EXAMINATION:  GENERAL: Patient is a well-developed, well-nourished female in no acute distress. HEAD AND NECK: There is no obvious  lymphadenopathy. The patient denies acute TMJ symptoms. INTRAORAL EXAM: Patient has normal saliva. There is a fistulous tract near tooth #13. Some purulence is coming from the fistulous tract. No buccal swelling is noted. DENTITION: Patient is missing tooth numbers 1, 2, 4, 5, 8, 12, 14, 16, 18, 30, and 31. Tooth #13 has a fractured buccal cusp and this was removed during the examination today with no complications. PERIODONTAL: Patient with chronic periodontitis with plaque and calculus accumulations, generalized gingival recession and generalized tooth mobility as per dental charting form. DENTAL CARIES/SUBOPTIMAL RESTORATIONS: There are multiple dental caries noted as per dental charting form. ENDODONTIC: Patient with a history of acute pulpitis symptoms coming from tooth #13. Tooth #13 has periapical pathology and radiolucency. Tooth #13 also has a fistulous tract and purulence coming from the fistulous tract. Patient was prescribed amoxicillin 5 mg every 8 hours for 7 days today. CROWN AND BRIDGE: Patient has a bridge from tooth #7 through #9. The pontil #8 has fractured incisal porcelain. PROSTHODONTIC: Patient has no partial dentures by report. OCCLUSION: Patient with a poor occlusal scheme secondary to multiple missing teeth, supra-eruption and drifting of the unopposed teeth into the edentulous areas and lack of replacement of the missing teeth with dental prostheses.  RADIOGRAPHIC INTERPRETATION: A panoramic x-ray was obtained along with a full series of dental radiographs. There are multiple missing teeth. There is moderate bone loss. There multiple dental caries noted. There is periapical radiolucency associated with the apex of tooth #13. There is radiographic calculus noted. There is supra-eruption and drifting of the unopposed teeth into the edentulous areas.  ASSESSMENTS: 1. Acute pulpitis symptoms 2. 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 Fractured buccal cusp of tooth  #13 that was removed during the examination today. 4. Chronic periodontitis with bone loss. 5. plaque and calculus accumulation 6. Generalized gingival recession 7. Generalized tooth mobility 8. Radiographic  calculus 9. Multiple dental caries 10. History of root canal therapy #3 now exposed to the oral environment. 11. Supra-eruption and drifting of the unopposed teeth into the edentulous areas 12. Poor occlusal scheme 13. Significant cardiovascular and pulmonary compromise with the risks up to and including death with invasive dental procedures.   PLAN/RECOMMENDATIONS: 1. I discussed the risks, benefits, and complications of various treatment options with the patient in relationship to her medical and dental conditions. We discussed various treatment options to include no treatment, multiple extractions with alveoloplasty, pre-prosthetic surgery as indicated, periodontal therapy, dental restorations, root canal therapy, crown and bridge therapy, implant therapy, and replacement of missing teeth as indicated. The patient currently wishes to proceed with extraction of tooth numbers 3, 13, and 32 with alveoloplasty as indicated along with gross debridement of remaining dentition the operating room.  This is contingent upon surgical clearance by the cardiology and pulmonary teams as indicated.  Patient will then require several dental restorations and evaluation for replacement of missing teeth is indicated after adequate healing with a primary dentist of her choice.    2. The patient was prescribed amoxicillin 500 mg every 8 hours for the next 7 days starting today.  3. Discussion of findings with medical team and coordination of future medical and dental care.  Lenn Cal, DDS

## 2011-08-22 NOTE — Patient Instructions (Signed)
MOUTH CARE AFTER SURGERY ° °FACTS: °· Ice used in ice bag helps keep the swelling down, and can help lessen the pain. °· It is easier to treat pain BEFORE it happens. °· Spitting disturbs the clot and may cause bleeding to start again, or to get worse. °· Smoking delays healing and can cause complications. °· Sharing prescriptions can be dangerous.  Do not take medications not recently prescribed for you. °· Antibiotics may stop birth control pills from working.  Use other means of birth control while on antibiotics. °· Warm salt water rinses after the first 24 hours will help lessen the swelling:  Use 1/2 teaspoonful of table salt per oz.of water. ° °DO NOT: °· Do not spit.  Do not drink through a straw. °· Strongly advised not to smoke, dip snuff or chew tobacco at least for 3 days. °· Do not eat sharp or crunchy foods.  Avoid the area of surgery when chewing. °· Do not stop your antibiotics before your instructions say to do so. °· Do not eat hot foods until bleeding has stopped.  If you need to, let your food cool down to room temperature. ° °EXPECT: °· Some swelling, especially first 2-3 days. °· Soreness or discomfort in varying degrees.  Follow your dentist's instructions about how to handle pain before it starts. °· Pinkish saliva or light blood in saliva, or on your pillow in the morning.  This can last around 24 hours. °· Bruising inside or outside the mouth.  This may not show up until 2-3 days after surgery.  Don't worry, it will go away in time. °· Pieces of "bone" may work themselves loose.  It's OK.  If they bother you, let us know. ° °WHAT TO DO IMMEDIATELY AFTER SURGERY: °· Bite on the gauze with steady pressure for 1-2 hours.  Don't chew on the gauze. °· Do not lie down flat.  Raise your head support especially for the first 24 hours. °· Apply ice to your face on the side of the surgery.  You may apply it 20 minutes on and a few minutes off.  Ice for 8-12 hours.  You may use ice up to 24  hours. °· Before the numbness wears off, take a pain pill as instructed. °· Prescription pain medication is not always required. ° °SWELLING: °· Expect swelling for the first couple of days.  It should get better after that. °· If swelling increases 3 days or so after surgery; let us know as soon as possible. ° °FEVER: °· Take Tylenol every 4 hours if needed to lower your temperature, especially if it is at 100F or higher. °· Drink lots of fluids. °· If the fever does not go away, let us know. ° °BREATHING TROUBLE: °· Any unusual difficulty breathing means you have to have someone bring you to the emergency room ASAP ° °BLEEDING: °· Light oozing is expected for 24 hours or so. °· Prop head up with pillows °· Avoid spitting °· Do not confuse bright red fresh flowing blood with lots of saliva colored with a little bit of blood. °· If you notice some bleeding, place gauze or a tea bag where it is bleeding and apply CONSTANT pressure by biting down for 1 hour.  Avoid talking during this time.  Do not remove the gauze or tea bag during this hour to "check" the bleeding. °· If you notice bright RED bleeding FLOWING out of particular area, and filling the floor of your mouth, put   a wad of gauze on that area, bite down firmly and constantly.  Call us immediately.  If we're closed, have someone bring you to the emergency room.  ORAL HYGIENE:  Brush your teeth as usual after meals and before bedtime.  Use a soft toothbrush around the area of surgery.  DO NOT AVOID BRUSHING.  Otherwise bacteria(germs) will grow and may delay healing or encourage infection.  Since you cannot spit, just gently rinse and let the water flow out of your mouth.  DO NOT SWISH HARD.  EATING:  Cool liquids are a good point to start.  Increase to soft foods as tolerated.  PRESCRIPTIONS:  Follow the directions for your prescriptions exactly as written.  If Dr. Enrique Sack gave you a narcotic pain medication, do not drive, operate  machinery or drink alcohol when on that medication.  QUESTIONS:  Call our office during office hours [phone] or call the Emergency Room at [phone].

## 2011-09-04 ENCOUNTER — Telehealth: Payer: Self-pay | Admitting: *Deleted

## 2011-09-04 NOTE — Telephone Encounter (Signed)
Left message for Ms. Kwan to call and make an appointment with Scott Weaver prior to dental surgery with Dr Kulinski.  

## 2011-09-04 NOTE — Telephone Encounter (Signed)
   Message     Appointment with Nicki Reaper 09/20/11 @ 11:15. Ms. Buttermore refused sooner appointment. ----- Message ----- From: Katrine Coho, RN Sent: 09/03/2011 10:25 AM To: Armando Gang, Karlene Einstein, NT  Per Dr Scarlette Shorts schedule an appt for pt with Richardson Dopp prior to dental surgery with Dr Enrique Sack. Please let Dr Ritta Slot office know when the appt with Nicki Reaper is scheduled. [phone] Thanks. Webb Silversmith

## 2011-09-04 NOTE — Telephone Encounter (Deleted)
Left message for [name] to call and make an appointment with Richardson Dopp prior to dental surgery with Dr Enrique Sack.

## 2011-09-05 ENCOUNTER — Telehealth: Payer: Self-pay | Admitting: Physician Assistant

## 2011-09-05 NOTE — Telephone Encounter (Signed)
Fu msg Patient returning your call

## 2011-09-11 NOTE — Telephone Encounter (Signed)
Ms. North was scheduled to see Richardson Dopp 09/20/11 @ 11:30 for surgical clearance per  Dr. Enrique Sack. Patient reschedule appointment to 10/04/11 @ 11:30.

## 2011-09-17 ENCOUNTER — Other Ambulatory Visit (HOSPITAL_COMMUNITY): Payer: Self-pay | Admitting: Dentistry

## 2011-09-20 ENCOUNTER — Ambulatory Visit: Payer: Self-pay | Admitting: Physician Assistant

## 2011-09-23 ENCOUNTER — Ambulatory Visit (INDEPENDENT_AMBULATORY_CARE_PROVIDER_SITE_OTHER)
Admission: RE | Admit: 2011-09-23 | Discharge: 2011-09-23 | Disposition: A | Discharge status: Home / Self Care | Payer: Medicare Other | Source: Ambulatory Visit | Attending: Emergency Medicine | Admitting: Emergency Medicine

## 2011-09-23 ENCOUNTER — Ambulatory Visit: Payer: Self-pay

## 2011-09-23 ENCOUNTER — Ambulatory Visit (INDEPENDENT_AMBULATORY_CARE_PROVIDER_SITE_OTHER): Payer: Medicare Other | Admitting: Emergency Medicine

## 2011-09-23 ENCOUNTER — Encounter: Payer: Self-pay | Admitting: Emergency Medicine

## 2011-09-23 VITALS — BP 132/82 | HR 76 | Temp 97.9°F | Ht 63.0 in | Wt 182.8 lb

## 2011-09-23 DIAGNOSIS — I2789 Other specified pulmonary heart diseases: Secondary | ICD-10-CM

## 2011-09-23 DIAGNOSIS — D869 Sarcoidosis, unspecified: Secondary | ICD-10-CM

## 2011-09-23 MED ORDER — ALBUTEROL SULFATE HFA 108 (90 BASE) MCG/ACT IN AERS: 2.0000 | INHALATION_SPRAY | Freq: Four times a day (QID) | RESPIRATORY_TRACT | Status: DC

## 2011-09-23 NOTE — Assessment & Plan Note (Addendum)
Due for 6 minute walk today Continue the ventavis and revatio O2 at all times, 4L/min pulsed and high flow when walking.  Do not restart flovent Scheduled for TTE on 6/28 w Dr Aundra Dubin May be a candidate for transplant some day although there are financial and family barriers.

## 2011-09-23 NOTE — Progress Notes (Signed)
  Subjective:    Patient ID: Rachel [name], female    DOB: 08-Oct-1960, 51 y.o.   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 She is now on Revatio 80 mg tid and and inhaled Iloprost (added in 2011), managed by Dr Aundra Dubin at [facility]. 6 min walk (7/12) 248 m. RHC (8/12): mean RA 2, PA 51/24, mean PA 35, mean PCWP 6, CI 3, PVR 5.1 WU. She believes that she is doing very well. She wears O2 at 6L/min at all times. She believes she knows when her sarcoidosis is active, remains on flovent.   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 She is now on Revatio 80 mg tid and and inhaled Iloprost (added in 2011). Hasn't has 6 min walk yet. Stopped flovent last time to see if she would miss it.  She is having a bit more exertional SOB w chores, thinks this may be due in part to deconditioning.   CXR today >> improved LLL atx., scattered B infiltrates.        Objective:   Physical Exam Filed Vitals:   09/23/11 1344  BP: 132/82  Pulse: 76  Temp: 97.9 F (36.6 C)    Gen: Pleasant, overwt, in no distress on O2,  normal affect  ENT: No lesions,  mouth clear,  oropharynx clear, no postnasal drip  Neck: No JVD, no TMG, no carotid bruits  Lungs: No use of accessory muscles, few B insp crackles  Cardiovascular: RRR, prominent P2  Musculoskeletal: No deformities, no cyanosis or clubbing  Neuro: alert, non focal  Skin: Warm, no lesions or rashes     Assessment & Plan:  PULMONARY HYPERTENSION Due for 6 minute walk today Continue the ventavis and revatio O2 at all times, 4L/min pulsed and high flow when walking.  Do not restart flovent Scheduled for TTE on 6/28 w Dr Aundra Dubin May be a candidate for transplant some day although there are financial and family barriers.

## 2011-09-23 NOTE — Patient Instructions (Addendum)
Please continue your ventavis and revatio as you are taking them Continue your oxygen as you are using it We will perform 6 minute walk today Get your echocardiogram on 6/28 as planned.  Follow with Dr Lamonte Sakai in 3 months or sooner if you have any problems.

## 2011-09-25 ENCOUNTER — Telehealth: Payer: Self-pay | Admitting: Emergency Medicine

## 2011-09-25 NOTE — Progress Notes (Signed)
Quick Note:  Pt aware of results. ______ 

## 2011-09-25 NOTE — Progress Notes (Signed)
Quick Note:  [id number] ______

## 2011-09-25 NOTE — Telephone Encounter (Signed)
Notes Recorded by Collene Gobble, MD on 09/24/2011 at 5:06 PM Notify patient CXR is unchanged  Pt aware of results.

## 2011-09-26 ENCOUNTER — Ambulatory Visit (HOSPITAL_COMMUNITY): Admission: RE | Admit: 2011-09-26 | Payer: Medicare Other | Source: Ambulatory Visit | Admitting: Dentistry

## 2011-09-26 ENCOUNTER — Encounter (HOSPITAL_COMMUNITY): Admission: RE | Payer: Self-pay | Source: Ambulatory Visit

## 2011-09-26 SURGERY — DENTAL RESTORATION/EXTRACTIONS
Anesthesia: Monitor Anesthesia Care

## 2011-10-04 ENCOUNTER — Ambulatory Visit (INDEPENDENT_AMBULATORY_CARE_PROVIDER_SITE_OTHER): Payer: Medicare Other | Admitting: Physician Assistant

## 2011-10-04 ENCOUNTER — Encounter: Payer: Self-pay | Admitting: Physician Assistant

## 2011-10-04 VITALS — BP 128/82 | HR 71 | Ht 63.0 in | Wt 182.0 lb

## 2011-10-04 DIAGNOSIS — Z01810 Encounter for preprocedural cardiovascular examination: Secondary | ICD-10-CM

## 2011-10-04 DIAGNOSIS — F411 Generalized anxiety disorder: Secondary | ICD-10-CM

## 2011-10-04 DIAGNOSIS — I428 Other cardiomyopathies: Secondary | ICD-10-CM

## 2011-10-04 DIAGNOSIS — I2789 Other specified pulmonary heart diseases: Secondary | ICD-10-CM

## 2011-10-04 DIAGNOSIS — D869 Sarcoidosis, unspecified: Secondary | ICD-10-CM

## 2011-10-04 NOTE — Progress Notes (Addendum)
[name], [place]  [address] Phone: [phone] Fax:  [phone]  Date:  10/04/2011   Name:  Rachel [name]   DOB:  [birth year]   MRN:  [id number]  PCP:  MATTHEWS,CODY, DO  Primary Cardiologist:  Dr. [name]  Primary Electrophysiologist:  None    History of Present Illness: Rachel [name] is a 51 y.o. female who returns for surgical clearance.   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 She is now on Revatio 80 mg tid and and inhaled Iloprost. She has been evaluated for heart/lung transplant at [facility] as an inpatient but needed to raise around $10,000 to continue evaluation and has decided against this for now. She has started seeing Dr. Lamonte Sakai for her sarcoid.  Patient symptomatically improved on Iloprost.  Last saw Dr. Aundra Dubin 07/22/11.  It was felt that she would need a followup echo 11/2011 and a followup right heart cath.  She needs multiple tooth extractions.  This is to be done with Dr. Enrique Sack.  Last saw Dr. Lamonte Sakai 09/23/11.  She is not sure that she will be pursuing multiple dental extractions as recommended.  She is concerned because she does not have the funds to obtain proper dentures.  Currently, her cardiovascular status appears to be stable.  She continues to report class III symptoms.  She continues to try to increase her activity as much as possible.  She denies orthopnea, PND or edema.  She denies syncope or chest pain.  She is compliant with her medications.  Wt Readings from Last 3 Encounters:  10/04/11 182 lb (82.555 kg)  09/23/11 182 lb 12.8 oz (82.918 kg)  07/22/11 183 lb 12.8 oz (83.371 kg)     Potassium  Date/Time Value Range Status  03/04/2011  2:51 PM 4.4  3.5 - 5.1 mEq/L Final     Creatinine, Ser  Date/Time Value Range Status  03/04/2011  2:51 PM 0.9  0.4 - 1.2 mg/dL Final     ALT  Date/Time Value Range Status  07/27/2010  5:00 AM 9  0 -  35 U/L Final     TSH  Date/Time Value Range Status  08/23/2008  2:10 AM 1.609 0.350 - 4.500 uIU/mL Final     Hemoglobin  Date/Time Value Range Status  11/06/2010  3:02 PM 11.6 12.0 - 15.0 g/dL Final   Past Medical History:  1. Sarcoidosis: Stage IV pulmonary sarcoid on home O2. Has been quiescent recently. She has been referred to [facility] for lung transplant evaluation.  2. Cardiomyopathy: Cardiac MRI in 2004 with no evidence for infiltrative disease but EF 40%. Echo (9/10): EF 60%, severely dilated RV with moderately decreased systolic function, moderate RAE, PASP 83 mmHg. Echo (1/12): EF 33%, grade I diastolic dysfunction, moderate biatrial enlargement, moderate RV dilation with normal RV systolic function, peak RV-RA gradient 34 mmHg.  3. Pulmonary hypertension with RV failure: Moderate to severe, likely secondary to sarcoidosis and parenchymal lung disease/hypoxic pulmonary vasoconstriction. RHC 5/10 showed PA 67/30 (mean 46) with mean PCWP 6 mmHg. Patient started on Revatio in 5/10 without any change in oxygen saturation. 6 minute walk 7/10: 61 m. Echo (9/10): EF 60%, severely dilated RV with moderately decreased systolic function, moderate RAE, PASP 83 mmHg. RHC (9/10) with mean RA 15, PA 74/36, CI 2.8. Repeat RHC after diuresis with mean RA 3, PA 60/22, mean PCWP 4. Echo (1/11): EF 38-32%, grade I diastolic  dysfunction, moderately dilated RV, mild to moderate RV dysfunction, moderate to severe TR, PASP 58 mmHg. 6 minute walk (2/11): 122 m. 6 min walk (6/11): 161.5 m. RHC (6/11): mean RA 11, RV 64/15, PA 63/28 mean 43, mean PCWP 14, CI 2.4 thermodilution and 3.2 Fick, PVR 5.3 WU Fick and 7.25 WU thermodilution. Iloprost begun. 6 min walk (10/11): 158 m. 6 min walk (1/12) 273 m. 6 min walk (7/12) 248 m. RHC (8/12): mean RA 2, PA 51/24, mean PA 35, mean PCWP 6, CI 3, PVR 5.1 WU.  4. 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 Device is now nearing ERI. Given end stage lung disease and normalization  of LV function, the device will not be replaced and tachy therapies were turned off.  5. GERD  6. Chronic chest pain. LHC (5/10) with no angiographic coronary disease.  7. Allergic rhinitis,  8. h/o iron deficiency anemia.  9. H/o secondary amenorrhea/irregular menses,  10. Psychosis with high dose steroids.  11. Possible runs of SVT  12. Recurrent Hypokalemia  13. h/o Rotator cuff sprain  14. Chronic back pain    Current Outpatient Prescriptions  Medication Sig Dispense Refill  . acetaminophen (TYLENOL) 325 MG tablet Take 650 mg by mouth as needed.        Marland Kitchen albuterol (PROAIR HFA) 108 (90 BASE) MCG/ACT inhaler Inhale 2 puffs into the lungs 4 (four) times daily.  8.5 g  4  . aspirin 81 MG tablet Take 81 mg by mouth daily.        . carvedilol (COREG) 3.125 MG tablet Take 1 tablet (3.125 mg total) by mouth 2 (two) times daily with a meal.  180 tablet  2  . esomeprazole (NEXIUM) 40 MG capsule Take 1 capsule (40 mg total) by mouth daily before breakfast.  90 capsule  2  . furosemide (LASIX) 40 MG tablet Take 40 mg by mouth every other day.      . Iloprost (VENTAVIS) 20 MCG/ML SOLN Inhale 0.25 mLs (5 mcg total) into the lungs 6 (six) times daily.  45 mL  4  . potassium chloride SA (K-DUR,KLOR-CON) 20 MEQ tablet 2 tabs am and 1 tab po qhs  270 tablet  2  . risperiDONE (RISPERDAL) 0.5 MG tablet Take 1 tablet (0.5 mg total) by mouth at bedtime.  90 tablet  2  . sildenafil (REVATIO) 20 MG tablet take 4  tablets every 8 hours         Allergies: Allergies  Allergen Reactions  . Ace Inhibitors   . Meperidine Hcl     REACTION: Makes her feel funny  . Other     High Dose Steroids  . Propoxyphene-Acetaminophen     REACTION: rash: pruritus  . Tramadol Hcl     REACTION: nausea, lightheadedness, sleepiness, and dizziness    History  Substance Use Topics  . Smoking status: Former Smoker -- 1.5 packs/day for 11 years    Types: Cigarettes    Quit date: 04/08/1992  . Smokeless tobacco: Not on  file   Comment: 1ppd x 20 years  . Alcohol Use: Yes     remote history of alcohol abuse (quit 1994)     ROS:  Please see the history of present illness.     All other systems reviewed and negative.   PHYSICAL EXAM: VS:  BP 128/82  Pulse 71  Ht _0  (1.6 m)  Wt 182 lb (82.555 kg)  BMI 32.24 kg/m2 Well nourished, well developed, in no  acute distress HEENT: normal Neck: no JVD Cardiac:  normal S1, S2; RRR; no murmur Lungs:  Decreased breath sounds bilaterally, no wheezing, rhonchi or rales Abd: soft, nontender, no hepatomegaly Ext: Very trace bilateral LE edema Skin: warm and dry Neuro:  CNs 2-12 intact, no focal abnormalities noted  EKG:  Sinus rhythm, heart rate 71, normal axis, nonspecific ST-T wave changes, LVH, no change from prior tracing   ASSESSMENT AND PLAN:  1.  Pulmonary hypertension in the setting of Stage IV pulmonary sarcoidosis on chronic O2 Overall, she is stable.  She is due for follow up echocardiogram in August 2013.  I discussed her case today with Dr. Aundra Dubin.  Her echocardiogram should be done prior to proceeding with surgery.  Her dental extractions apparently need to be done under general anesthesia.  She had cardiac catheterization performed in 2010.  She had no coronary artery disease.  Aside from an echocardiogram, she requires no further cardiac workup.  Given her chronic lung disease, she is mild to moderate risk for complications.  I discussed this with the patient today.  As noted above, she is still unsure as to whether or not she wants to pursue her dental extractions.  She is due for follow up with Dr. Aundra Dubin in August and we will make sure that appointment is made.  Danton Sewer, PA-C  12:09 PM 10/04/2011

## 2011-10-04 NOTE — Patient Instructions (Addendum)
Your physician has requested that you have an echocardiogram TO BE DONE SOMETIME EITHER IN July OR AUGUST . Echocardiography is a painless test that uses sound waves to create images of your heart. It provides your doctor with information about the size and shape of your heart and how well your heart's chambers and valves are working. This procedure takes approximately one hour. There are no restrictions for this procedure.  PLEASE FOLLOW UP WITH DR. Aundra Dubin AFTER THE ECHO HAS BEEN DONE

## 2011-10-09 ENCOUNTER — Telehealth (HOSPITAL_COMMUNITY): Payer: Self-pay | Admitting: Dental General Practice

## 2011-11-05 ENCOUNTER — Other Ambulatory Visit (HOSPITAL_COMMUNITY)
Admission: RE | Admit: 2011-11-05 | Discharge: 2011-11-05 | Disposition: A | Discharge status: Home / Self Care | Payer: Medicare Other | Source: Ambulatory Visit | Attending: Family Medicine | Admitting: Family Medicine

## 2011-11-05 ENCOUNTER — Encounter: Payer: Self-pay | Admitting: Family Medicine

## 2011-11-05 ENCOUNTER — Ambulatory Visit (INDEPENDENT_AMBULATORY_CARE_PROVIDER_SITE_OTHER): Payer: Medicare Other | Admitting: Family Medicine

## 2011-11-05 VITALS — BP 120/82 | HR 70 | Ht 63.0 in | Wt 180.0 lb

## 2011-11-05 DIAGNOSIS — Z0000 Encounter for general adult medical examination without abnormal findings: Secondary | ICD-10-CM

## 2011-11-05 DIAGNOSIS — Z124 Encounter for screening for malignant neoplasm of cervix: Secondary | ICD-10-CM

## 2011-11-05 DIAGNOSIS — I10 Essential (primary) hypertension: Secondary | ICD-10-CM

## 2011-11-05 DIAGNOSIS — E663 Overweight: Secondary | ICD-10-CM

## 2011-11-05 LAB — LIPID PANEL
Cholesterol: 200 mg/dL (ref 0–200)
HDL: 88 mg/dL (ref >39)
Total CHOL/HDL Ratio: 2.3 Ratio
Triglycerides: 49 mg/dL (ref <150)

## 2011-11-05 LAB — CBC
Platelets: 237 10*3/uL (ref 150–400)
RBC: 4.07 MIL/uL (ref 3.87–5.11)
RDW: 14.2 % (ref 11.5–15.5)
WBC: 6.9 10*3/uL (ref 4.0–10.5)

## 2011-11-05 LAB — COMPREHENSIVE METABOLIC PANEL
ALT: <8 U/L (ref 0–35)
Albumin: 4.5 g/dL (ref 3.5–5.2)
CO2: 33 mEq/L — ABNORMAL HIGH (ref 19–32)
Calcium: 9.7 mg/dL (ref 8.4–10.5)
Chloride: 102 mEq/L (ref 96–112)
Glucose, Bld: 77 mg/dL (ref 70–99)
Sodium: 141 mEq/L (ref 135–145)
Total Bilirubin: 0.5 mg/dL (ref 0.3–1.2)
Total Protein: 8 g/dL (ref 6.0–8.3)

## 2011-11-05 MED ORDER — CARVEDILOL 3.125 MG PO TABS: 3.1250 mg | ORAL_TABLET | Freq: Two times a day (BID) | ORAL | Status: DC

## 2011-11-05 MED ORDER — FUROSEMIDE 40 MG PO TABS: 40.0000 mg | ORAL_TABLET | ORAL | Status: DC

## 2011-11-05 MED ORDER — ALBUTEROL SULFATE HFA 108 (90 BASE) MCG/ACT IN AERS: 2.0000 | INHALATION_SPRAY | Freq: Four times a day (QID) | RESPIRATORY_TRACT | Status: DC

## 2011-11-05 MED ORDER — RISPERIDONE 0.5 MG PO TABS: 0.5000 mg | ORAL_TABLET | Freq: Every day | ORAL | Status: DC

## 2011-11-05 MED ORDER — POTASSIUM CHLORIDE CRYS ER 20 MEQ PO TBCR: EXTENDED_RELEASE_TABLET | ORAL | Status: DC

## 2011-11-05 NOTE — Progress Notes (Signed)
Patient ID: Rachel [name], female   DOB: December 30, 1960, 51 y.o.   MRN: [id number] SUBJECTIVE:  51 y.o. female for annual routine Pap and checkup. Current Outpatient Prescriptions  Medication Sig Dispense Refill  . acetaminophen (TYLENOL) 325 MG tablet Take 650 mg by mouth as needed.        Marland Kitchen albuterol (PROAIR HFA) 108 (90 BASE) MCG/ACT inhaler Inhale 2 puffs into the lungs 4 (four) times daily.  8.5 g  4  . aspirin 81 MG tablet Take 81 mg by mouth daily.        . carvedilol (COREG) 3.125 MG tablet Take 1 tablet (3.125 mg total) by mouth 2 (two) times daily with a meal.  180 tablet  2  . esomeprazole (NEXIUM) 40 MG capsule Take 1 capsule (40 mg total) by mouth daily before breakfast.  90 capsule  2  . furosemide (LASIX) 40 MG tablet Take 1 tablet (40 mg total) by mouth every other day.  45 tablet  2  . Iloprost (VENTAVIS) 20 MCG/ML SOLN Inhale 0.25 mLs (5 mcg total) into the lungs 6 (six) times daily.  45 mL  4  . potassium chloride SA (K-DUR,KLOR-CON) 20 MEQ tablet 2 tabs am and 1 tab po qhs  270 tablet  2  . risperiDONE (RISPERDAL) 0.5 MG tablet Take 1 tablet (0.5 mg total) by mouth at bedtime.  90 tablet  2  . sildenafil (REVATIO) 20 MG tablet take 4  tablets every 8 hours        Allergies: Ace inhibitors; Meperidine hcl; Nitroglycerin; Other; Propoxyphene-acetaminophen; and Tramadol hcl  No LMP recorded. Patient is postmenopausal.  ROS:  Feeling well. No dyspnea or chest pain on exertion.  No abdominal pain, change in bowel habits, black or bloody stools.  No urinary tract symptoms. GYN ROS: Post menopausal, no abnormal bleeding, pelvic pain or discharge, no breast pain or new or enlarging lumps on self exam. No neurological complaints.  OBJECTIVE:  The patient appears well, alert, oriented x 3, in no distress. BP 120/82  Pulse 70  Ht _0  (1.6 m)  Wt 180 lb (81.647 kg)  BMI 31.89 kg/m2  SpO2 98% ENT normal.  Neck supple. No adenopathy or thyromegaly. PERLA.  O2 via nasal canula in place    Lungs with mild crackles, good air entry, no wheezes.  CV: S1 and S2 normal, no murmurs, regular rate and rhythm.  Abdomen soft without tenderness, guarding, mass or organomegaly.  Extremities show no edema, normal peripheral pulses.  Neurological is normal, no focal findings.  BREAST EXAM: breasts appear normal, no suspicious masses, no skin or nipple changes or axillary nodes, patient declines to have breast exam  PELVIC EXAM: normal external genitalia, vulva, vagina, cervix, uterus and adnexa, PAP: Pap smear done today, exam chaperoned by Mauricia Area  ASSESSMENT:  1.  well woman   PLAN:  mammogram pap smear additional lab tests per orders return annually or prn

## 2011-11-06 ENCOUNTER — Encounter: Payer: Self-pay | Admitting: Family Medicine

## 2011-12-03 ENCOUNTER — Ambulatory Visit: Payer: Self-pay | Admitting: Cardiology

## 2011-12-03 ENCOUNTER — Ambulatory Visit (HOSPITAL_COMMUNITY): Payer: Medicare Other | Attending: Cardiology

## 2011-12-03 DIAGNOSIS — I059 Rheumatic mitral valve disease, unspecified: Secondary | ICD-10-CM | POA: Insufficient documentation

## 2011-12-03 DIAGNOSIS — I079 Rheumatic tricuspid valve disease, unspecified: Secondary | ICD-10-CM | POA: Insufficient documentation

## 2011-12-03 DIAGNOSIS — I428 Other cardiomyopathies: Secondary | ICD-10-CM

## 2011-12-03 DIAGNOSIS — I2789 Other specified pulmonary heart diseases: Secondary | ICD-10-CM | POA: Insufficient documentation

## 2011-12-03 NOTE — Progress Notes (Signed)
Echocardiogram performed.  

## 2011-12-23 ENCOUNTER — Ambulatory Visit: Payer: Self-pay | Admitting: Cardiology

## 2012-01-07 ENCOUNTER — Ambulatory Visit: Payer: Self-pay | Admitting: Emergency Medicine

## 2012-01-13 ENCOUNTER — Ambulatory Visit (INDEPENDENT_AMBULATORY_CARE_PROVIDER_SITE_OTHER): Payer: Medicare Other | Admitting: Cardiology

## 2012-01-13 ENCOUNTER — Encounter: Payer: Self-pay | Admitting: Cardiology

## 2012-01-13 VITALS — BP 144/89 | HR 67 | Resp 18 | Ht 63.0 in | Wt 186.1 lb

## 2012-01-13 DIAGNOSIS — R0602 Shortness of breath: Secondary | ICD-10-CM

## 2012-01-13 DIAGNOSIS — I2789 Other specified pulmonary heart diseases: Secondary | ICD-10-CM

## 2012-01-13 DIAGNOSIS — I272 Pulmonary hypertension, unspecified: Secondary | ICD-10-CM

## 2012-01-13 LAB — BASIC METABOLIC PANEL
BUN: 15 mg/dL (ref 6–23)
CO2: 31 mEq/L (ref 19–32)
Calcium: 9.3 mg/dL (ref 8.4–10.5)
Creatinine, Ser: 0.7 mg/dL (ref 0.4–1.2)
GFR: 115.09 mL/min (ref >60.00)
Glucose, Bld: 72 mg/dL (ref 70–99)
Sodium: 139 mEq/L (ref 135–145)

## 2012-01-13 NOTE — Patient Instructions (Addendum)
Increase lasix (furosemide) to 50m daily for 5 days then go back to 460mevery other day.   Your physician recommends that you have  lab work today--BMET/BNP.  Your physician recommends that you schedule a follow-up appointment in: 1 month with Dr McAundra Dubin

## 2012-01-13 NOTE — Progress Notes (Signed)
Patient ID: Rachel [name], female   DOB: 29-Dec-1960, 51 y.o.   MRN: [id number] PCP: Dr. 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 She is now on Revatio 80 mg tid and and inhaled Iloprost.  She has been evaluated for heart/lung transplant at [facility] as an inpatient but needed to raise around $10,000 to continue evaluation and has decided against this for now. She has started seeing Dr. Lamonte Sakai for her sarcoid.     Rachel [name] is feeling tired today.  She helped out a church function over the weekend and thinks this wore her out.  She also has not taken her Lasix for about 3 days.  Weight is up 4 lbs.  She has been more short of breath over the weekend and today.  She has been dyspneic with her housework.  No chest pain, lightheadedness, or syncope.     Labs (11/10): BNP 91, creatinine 1.0, K 4  Labs (2/11): K 4.6, creatinine 0.86  Labs (3/11): BNP 58, K 4.2, creatinine 0.8  Labs (10/11): K 4.5, creatinine 0.8, BNP 76  Labs (7/13): LDL 102, K 4.1, creatinine 0.85  Allergies (verified):  1) ! * Steroids High Dose  2) Demerol  3) Ultram  4) Darvocet-N 100  5) Doxycycline   Past Medical History:  1. Sarcoidosis: Stage IV pulmonary sarcoid on home O2.  Has been quiescent recently.  She has been referred to [facility] for lung transplant evaluation.  2. Cardiomyopathy: Cardiac MRI in 2004 with no evidence for infiltrative disease but EF 40%. Echo (9/10): EF 60%, severely dilated RV with moderately decreased systolic function, moderate RAE, PASP 83 mmHg.  Echo (1/12): EF 34%, grade I diastolic dysfunction, moderate biatrial enlargement, moderate RV dilation with normal RV systolic function, peak RV-RA gradient 34 mmHg.  Echo (8/13): EF 55-60%, mildly dilated RV with mild systolic dysfunction, PA systolic pressure 45 mmHg.  3. Pulmonary hypertension with RV failure: Moderate to severe, likely secondary to sarcoidosis and  parenchymal lung disease/hypoxic pulmonary vasoconstriction. RHC 5/10 showed PA 67/30 (mean 46) with mean PCWP 6 mmHg. Patient started on Revatio in 5/10 without any change in oxygen saturation. 6 minute walk 7/10: 61 m. Echo (9/10): EF 60%, severely dilated RV with moderately decreased systolic function, moderate RAE, PASP 83 mmHg. RHC (9/10) with mean RA 15, PA 74/36, CI 2.8. Repeat RHC after diuresis with mean RA 3, PA 60/22, mean PCWP 4. Echo (1/11): EF 74-25%, grade I diastolic dysfunction, moderately dilated RV, mild to moderate RV dysfunction, moderate to severe TR, PASP 58 mmHg. 6 minute walk (2/11): 122 m. 6 min walk (6/11): 161.5 m. RHC (6/11): mean RA 11, RV 64/15, PA 63/28 mean 43, mean PCWP 14, CI 2.4 thermodilution and 3.2 Fick, PVR 5.3 WU Fick and 7.25 WU thermodilution. Iloprost begun. 6 min walk (10/11): 158 m. 6 min walk (1/12) 273 m.  6 min walk (7/12) 248 m.  RHC (8/12): mean RA 2, PA 51/24, mean PA 35, mean PCWP 6, CI 3, PVR 5.1 WU.  PASP 45 mmHg on 8/13 echo.  4. 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 Device is now nearing ERI. Given end stage lung disease and normalization of LV function, the device will not be replaced and tachy therapies were turned off.  5. GERD  6. Chronic chest pain. LHC (5/10) with no angiographic coronary disease.  7. Allergic rhinitis,  8. h/o iron deficiency anemia.  9. H/o secondary amenorrhea/irregular menses,  10. Psychosis with high dose steroids.  11. Possible runs of SVT  12. Recurrent Hypokalemia  13. h/o Rotator cuff sprain  14. 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   ROS: All systems reviewed and negative except as per HPI.    Current Outpatient Prescriptions  Medication Sig Dispense Refill  . acetaminophen (TYLENOL) 325 MG tablet Take 650 mg by mouth as needed.        Marland Kitchen albuterol (PROAIR HFA) 108 (90 BASE) MCG/ACT inhaler Inhale 2 puffs into the lungs 4 (four) times daily.  8.5 g  4  . aspirin 81 MG tablet Take 81 mg by mouth daily.        . carvedilol (COREG) 3.125 MG tablet Take 1 tablet (3.125 mg total) by mouth 2 (two) times daily with a meal.  180 tablet  2  . esomeprazole (NEXIUM) 40 MG capsule Take 1 capsule (40 mg total) by mouth daily before breakfast.  90 capsule  2  . furosemide (LASIX) 40 MG tablet Take 1 tablet (40 mg total) by mouth every other day.  45 tablet  2  . potassium chloride SA (K-DUR,KLOR-CON) 20 MEQ tablet 2 tabs am and 1 tab po qhs  270 tablet  2  . risperiDONE (RISPERDAL) 0.5 MG tablet Take 1 tablet (0.5 mg total) by mouth at bedtime.  90 tablet  2  . sildenafil (REVATIO) 20 MG tablet take 4  tablets every 8 hours         BP 144/89  Pulse 67  Resp 18  Ht _0  (1.6 m)  Wt 186 lb 1.9 oz (84.423 kg)  BMI 32.97 kg/m2  SpO2 95% General: Well-developed,well-nourished,in no acute distress; O2 applied via nasal cannula  Neck: Neck supple, JVP 8 cm. No masses, thyromegaly or abnormal cervical nodes.  Lungs: Clear bilaterally to auscultation and percussion.  Heart: Non-displaced PMI, chest non-tender; regular rate and rhythm, S1, S2 without rubs or gallops. 1/6 systolic murmur LLSB. Loud P2. Carotid upstroke normal, no bruit. Pedals normal pulses. Trace ankle edema bilaterally. Abdomen: Bowel sounds positive; abdomen soft and non-tender without masses, organomegaly, or hernias noted. No hepatosplenomegaly.  Extremities: No clubbing or cyanosis.  Neurologic: Alert and oriented x 3.  Psych: Normal affect.  Assessment/Plan:  PULMONARY HYPERTENSION  Likely due parenchymal lung disease.  Iloprost was started around 9/11 and uptitrated. Given her parenchymal lung disease, it should target ventilated segments of the lung and limit greater V/Q mismatching.  I am going to continue iloprost (now 5 micrograms 6 x a day) and Revatio. She is very compliant with iloprost dosing.  Right heart cath in 8/12 showed improved PA pressure and mildly improved cardiac index. PA pressure and PVR was still high, however. We did an echo this year in 8/13 showing PA systolic pressure 45 mmHg.  I am not going to change her PAH medications as I think a significant portion of her PAH is due to lung parenchymal damage. I do not think that we would be likely to get much more improvement with advancing  her pulmonary vasodilators.  She is more short of breath today than baseline and has missed Lasix for several days.  - I will have her take Lasix 40 mg daily x 5 days, then go back to every other day dosing.   - BMET/BNP  - I will see her back in the office in 1 month.  If she is feeling better, we will do a 6 minute walk.    [name] 01/13/2012 5:19 PM

## 2012-02-03 ENCOUNTER — Ambulatory Visit: Payer: Self-pay | Admitting: Emergency Medicine

## 2012-02-06 ENCOUNTER — Ambulatory Visit: Payer: Self-pay

## 2012-02-14 ENCOUNTER — Ambulatory Visit (INDEPENDENT_AMBULATORY_CARE_PROVIDER_SITE_OTHER): Payer: Medicare Other | Admitting: Emergency Medicine

## 2012-02-14 ENCOUNTER — Encounter: Payer: Self-pay | Admitting: Emergency Medicine

## 2012-02-14 VITALS — BP 130/72 | HR 101 | Temp 98.1°F | Ht 63.0 in | Wt 184.0 lb

## 2012-02-14 DIAGNOSIS — I2789 Other specified pulmonary heart diseases: Secondary | ICD-10-CM

## 2012-02-14 NOTE — Patient Instructions (Addendum)
Please continue Ventavis and Revatio Continue your oxygen  Get your 6 minute walk with Dr Aundra Dubin as planned We may decide to consider adding another medication (pill form) for pulmonary hypertension. We can discuss this after your 6 minute walk.  Follow with Dr Lamonte Sakai in 3 months or sooner if you have any problems.

## 2012-02-14 NOTE — Assessment & Plan Note (Addendum)
Her PASP is stable to improved 11/2011. Clinically stable - continue revatio + inhaled ventavis.  - continue O2.  - follow 6 minute walk  - diuretics per Dr Claris Gladden plans - she may be a candidate for another oral therapy like an ERB or one of the new medications being studied.  - follow in 3 months

## 2012-02-14 NOTE — Progress Notes (Signed)
  Subjective:    Patient ID: Rachel [name], female    DOB: [birth year], 51 y.o.   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 She is now on Revatio 80 mg tid and and inhaled Iloprost (added in 2011), managed by Dr Aundra Dubin at [facility]. 6 min walk (7/12) 248 m. RHC (8/12): mean RA 2, PA 51/24, mean PA 35, mean PCWP 6, CI 3, PVR 5.1 WU. She believes that she is doing very well. She wears O2 at 6L/min at all times. She believes she knows when her sarcoidosis is active, remains on flovent.   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 She is now on Revatio 80 mg tid and and inhaled Iloprost (added in 2011). Hasn't has 6 min walk yet. Stopped flovent last time to see if she would miss it.  She is having a bit more exertional SOB w chores, thinks this may be due in part to deconditioning.   CXR today >> improved LLL atx., scattered B infiltrates.   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 She is now on Revatio 80 mg tid and and inhaled Iloprost (added in 2011).  Lately she has been having some increased DOE, some exertional CP. Last 6 minute walk >> never had it yet. Planning to do next month after her ankle is evaluated due to pain when walking (prior fracture).   TTE 12/03/11 >> PASP 33mHg (was 58 in January 2011)      Objective:   Physical Exam Filed Vitals:   02/14/12 1505  BP: 130/72  Pulse: 101  Temp: 98.1 F (36.7 C)    Gen: Pleasant, overwt, in no distress on O2,  normal affect  ENT: No lesions,  mouth clear,  oropharynx clear, no postnasal drip  Neck: No JVD, no TMG, no carotid bruits  Lungs: No use of accessory muscles, few B insp crackles  Cardiovascular: RRR, prominent P2  Musculoskeletal: No deformities, no cyanosis or clubbing  Neuro: alert, non focal  Skin: Warm, no lesions or  rashes     Assessment & Plan:  PULMONARY HYPERTENSION Her PASP is stable to improved 11/2011. Clinically stable - continue revatio + inhaled ventavis.  - continue O2.  - follow 6 minute walk  - diuretics per Dr MClaris Gladdenplans - she may be a candidate for another oral therapy like an ERB or one of the new medications being studied.  - follow in 3 months

## 2012-03-02 ENCOUNTER — Ambulatory Visit: Payer: Self-pay | Admitting: Cardiology

## 2012-03-23 ENCOUNTER — Telehealth: Payer: Self-pay | Admitting: Cardiology

## 2012-03-23 NOTE — Telephone Encounter (Signed)
Spoke with Brandt Loosen [phone] [employer]. Pt's Revatio assistance ends 05/27/2012. Per Brandt Loosen pt needs to submit another application for Revatio to continue to get assistance. Brandt Loosen is going to fax an application to Dr Aundra Dubin for completion.

## 2012-03-23 NOTE — Telephone Encounter (Signed)
plz return call to pt [phone] to discuss medication

## 2012-03-23 NOTE — Telephone Encounter (Signed)
Spoke with pt. Pt needs to update Revatio prescription.

## 2012-03-23 NOTE — Telephone Encounter (Signed)
I spoke with pt. She has received her part of the application in the mail. She is going to complete it and mail it back in to [employer]. I will let her know once Dr Claris Gladden part of the application has been completed and faxed back in to [employer].

## 2012-03-23 NOTE — Telephone Encounter (Signed)
Pt requesting our office call [employer] [phone] to update Revatio prescription.

## 2012-03-27 NOTE — Telephone Encounter (Signed)
Pt aware Dr Aundra Dubin has completed his portion of application for Revatio. This will be faxed to [place] [employer] [phone].

## 2012-04-20 ENCOUNTER — Ambulatory Visit: Payer: Self-pay | Admitting: Cardiology

## 2012-05-26 ENCOUNTER — Ambulatory Visit: Payer: Self-pay | Admitting: Cardiology

## 2012-05-27 ENCOUNTER — Ambulatory Visit: Payer: Self-pay | Admitting: Cardiology

## 2012-06-04 ENCOUNTER — Encounter: Payer: Self-pay | Admitting: Family Medicine

## 2012-06-04 ENCOUNTER — Ambulatory Visit (INDEPENDENT_AMBULATORY_CARE_PROVIDER_SITE_OTHER): Payer: Medicare Other | Admitting: Family Medicine

## 2012-06-04 VITALS — BP 130/80 | HR 88 | Ht 63.0 in | Wt 182.0 lb

## 2012-06-04 DIAGNOSIS — I10 Essential (primary) hypertension: Secondary | ICD-10-CM

## 2012-06-04 DIAGNOSIS — I2789 Other specified pulmonary heart diseases: Secondary | ICD-10-CM

## 2012-06-04 DIAGNOSIS — F411 Generalized anxiety disorder: Secondary | ICD-10-CM

## 2012-06-04 DIAGNOSIS — Z760 Encounter for issue of repeat prescription: Secondary | ICD-10-CM

## 2012-06-04 MED ORDER — CARVEDILOL 3.125 MG PO TABS: 3.1250 mg | ORAL_TABLET | Freq: Two times a day (BID) | ORAL | Status: DC

## 2012-06-04 MED ORDER — ALBUTEROL SULFATE HFA 108 (90 BASE) MCG/ACT IN AERS: 2.0000 | INHALATION_SPRAY | Freq: Four times a day (QID) | RESPIRATORY_TRACT | Status: DC

## 2012-06-04 MED ORDER — ESOMEPRAZOLE MAGNESIUM 40 MG PO CPDR: 40.0000 mg | DELAYED_RELEASE_CAPSULE | Freq: Every day | ORAL | Status: DC

## 2012-06-04 MED ORDER — POTASSIUM CHLORIDE CRYS ER 20 MEQ PO TBCR: EXTENDED_RELEASE_TABLET | ORAL | Status: DC

## 2012-06-04 MED ORDER — FUROSEMIDE 40 MG PO TABS: 40.0000 mg | ORAL_TABLET | ORAL | Status: DC

## 2012-06-04 MED ORDER — RISPERIDONE 0.5 MG PO TABS: 0.5000 mg | ORAL_TABLET | Freq: Every day | ORAL | Status: DC

## 2012-06-04 NOTE — Patient Instructions (Addendum)
Thank you for coming in today, it was good to see you I will complete your paperwork in the next few days  Follow up with me in 6 months.

## 2012-06-08 DIAGNOSIS — Z760 Encounter for issue of repeat prescription: Secondary | ICD-10-CM | POA: Insufficient documentation

## 2012-06-08 NOTE — Assessment & Plan Note (Signed)
Refilled carvedilol and lasix.

## 2012-06-08 NOTE — Progress Notes (Signed)
  Subjective:    Patient ID: Rachel [name], female    DOB: Nov 20, 1960, 52 y.o.   MRN: [id number]  HPI Patient 45 minutes late to scheduled appointment today.    1. Medication refill:  Patient has not complaints today and is here for medication refill.  She is currently followed by Dr. Lamonte Sakai and Dr. Aundra Dubin for pulmonary sarcoid with pulmonary hypertension.  She is still using iloprost and sildenafil for this in addition to O2 4l/min around the clock.  She is tolerating all of her medications well.  She does not seem to monitor her weight or blood pressure at home in regards to her CHF and HTN.  Last labs were in 01/2012.     Review of Systems Per HPI    Objective:   Physical Exam  Constitutional: She appears well-nourished. No distress.  Nasal cannula in place   HENT:  Head: Normocephalic and atraumatic.  Neck: Neck supple.  Cardiovascular: Normal rate and regular rhythm.   Pulmonary/Chest: Effort normal and breath sounds normal.  Musculoskeletal: She exhibits no edema.          Assessment & Plan:

## 2012-06-08 NOTE — Assessment & Plan Note (Addendum)
Refilled multiple medications at todays visit.  Instructed to return for follow up in 6 months, recheck labs at that time.

## 2012-06-08 NOTE — Assessment & Plan Note (Signed)
Seems to be stable on current medications.  Has not followed up with Dr. Aundra Dubin or Dr. Lamonte Sakai as instructed.  I encouraged her to be sure to schedule follow up with them as it is important in monitoring and treating her chronic disease.

## 2012-06-08 NOTE — Assessment & Plan Note (Addendum)
?  Of Bipolar in the past also concern that benzodiazepine could worsen respiratory status.  Has been well controlled on risperidone.

## 2012-06-15 ENCOUNTER — Telehealth: Payer: Self-pay | Admitting: Family Medicine

## 2012-06-15 NOTE — Telephone Encounter (Signed)
Will fwd. To Dr.Matthews for review. Rachel [name], Rachel [name]

## 2012-06-15 NOTE — Telephone Encounter (Signed)
Patient is calling to check the status of her disability papers that she brought to her last appt.  She was supposed to have a copy mailed to her after they were completed and she has not received them yet.

## 2012-06-16 NOTE — Telephone Encounter (Signed)
Patient is calling to check the status of the paperwork.  [facility]

## 2012-06-18 NOTE — Telephone Encounter (Signed)
Completed and placed up front to be faxed and original to be mailed to patient.

## 2012-06-19 ENCOUNTER — Telehealth: Payer: Self-pay | Admitting: Family Medicine

## 2012-06-19 MED ORDER — ALBUTEROL SULFATE HFA 108 (90 BASE) MCG/ACT IN AERS: 2.0000 | INHALATION_SPRAY | Freq: Four times a day (QID) | RESPIRATORY_TRACT | Status: DC

## 2012-06-19 MED ORDER — CARVEDILOL 3.125 MG PO TABS: 3.1250 mg | ORAL_TABLET | Freq: Two times a day (BID) | ORAL | Status: DC

## 2012-06-19 MED ORDER — FUROSEMIDE 40 MG PO TABS: 40.0000 mg | ORAL_TABLET | ORAL | Status: DC

## 2012-06-19 MED ORDER — POTASSIUM CHLORIDE CRYS ER 20 MEQ PO TBCR: EXTENDED_RELEASE_TABLET | ORAL | Status: DC

## 2012-06-19 MED ORDER — ESOMEPRAZOLE MAGNESIUM 40 MG PO CPDR: 40.0000 mg | DELAYED_RELEASE_CAPSULE | Freq: Every day | ORAL | Status: DC

## 2012-06-19 MED ORDER — RISPERIDONE 0.5 MG PO TABS: 0.5000 mg | ORAL_TABLET | Freq: Every day | ORAL | Status: DC

## 2012-06-19 NOTE — Telephone Encounter (Signed)
Sent in previously on 2/27 with other prescriptions.  Will send in again.

## 2012-06-19 NOTE — Telephone Encounter (Signed)
Patient needs a refill on her Coreg to [facility].  Please call patient when this has been done.

## 2012-06-19 NOTE — Telephone Encounter (Signed)
Fwd. To PCP for refill. Javier Glazier, Gerrit Heck

## 2012-06-30 ENCOUNTER — Ambulatory Visit: Payer: Self-pay | Admitting: Cardiology

## 2012-07-07 ENCOUNTER — Telehealth: Payer: Self-pay | Admitting: Emergency Medicine

## 2012-07-07 NOTE — Telephone Encounter (Signed)
She could do nasal saline washes. Also should be OK to try chlorpheniramine or brompheniramine OTC. If continues then have her call us back, may decide to treat systemically;.

## 2012-07-07 NOTE — Telephone Encounter (Signed)
i spoke with pt and is aware of RB recs. Nothing further was needed

## 2012-07-07 NOTE — Telephone Encounter (Signed)
Called, spoke with pt.  Reports sinus pressure, coughing a little with a small amount of dark yellow mucus, has a tickle in throat, and some chills and sweats qhs.  Symptoms started with the weather change.  Denies chest tightness, chest pain, or increased SOB. No openings in office today. She was last seen by RB on 02/2012 and was asked to f/u in 3 months.  We have scheduled f/u OV with RB on July 20, 2012 at 3:45 pm. .  Pt aware of pending OV.  She is requesting RB's recs on an OTC med that would help with her symptoms.  She is concerned to take anything OTC with the pulm htn meds she is on.

## 2012-07-20 ENCOUNTER — Encounter: Payer: Self-pay | Admitting: Emergency Medicine

## 2012-07-20 ENCOUNTER — Ambulatory Visit (INDEPENDENT_AMBULATORY_CARE_PROVIDER_SITE_OTHER): Payer: Medicare Other | Admitting: Emergency Medicine

## 2012-07-20 VITALS — BP 124/86 | HR 94 | Temp 98.3°F | Ht 63.0 in | Wt 183.0 lb

## 2012-07-20 DIAGNOSIS — D869 Sarcoidosis, unspecified: Secondary | ICD-10-CM

## 2012-07-20 DIAGNOSIS — I2789 Other specified pulmonary heart diseases: Secondary | ICD-10-CM

## 2012-07-20 DIAGNOSIS — J019 Acute sinusitis, unspecified: Secondary | ICD-10-CM

## 2012-07-20 MED ORDER — LEVOFLOXACIN 500 MG PO TABS: 500.0000 mg | ORAL_TABLET | Freq: Every day | ORAL | Status: DC

## 2012-07-20 NOTE — Assessment & Plan Note (Signed)
levaquin x 7 days and then reassess

## 2012-07-20 NOTE — Assessment & Plan Note (Signed)
-  continue same meds and follow 6 min walk end of this month - rov 1 month

## 2012-07-20 NOTE — Progress Notes (Signed)
  Subjective:    Patient ID: Rachel [name], female    DOB: 06/20/1960, 52 y.o.   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 She is now on Revatio 80 mg tid and and inhaled Iloprost (added in 2011), managed by Dr Aundra Dubin at [facility]. 6 min walk (7/12) 248 m. RHC (8/12): mean RA 2, PA 51/24, mean PA 35, mean PCWP 6, CI 3, PVR 5.1 WU. She believes that she is doing very well. She wears O2 at 6L/min at all times. She believes she knows when her sarcoidosis is active, remains on flovent.   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 She is now on Revatio 80 mg tid and and inhaled Iloprost (added in 2011). Hasn't has 6 min walk yet. Stopped flovent last time to see if she would miss it.  She is having a bit more exertional SOB w chores, thinks this may be due in part to deconditioning.   CXR today >> improved LLL atx., scattered B infiltrates.   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 She is now on Revatio 80 mg tid and and inhaled Iloprost (added in 2011).  Lately she has been having some increased DOE, some exertional CP. Last 6 minute walk >> never had it yet. Planning to do next month after her ankle is evaluated due to pain when walking (prior fracture).   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 She is now on Revatio 80 mg tid and and inhaled Iloprost.  She has been having nasal congestion and overall more dyspnea for the last . I recommended that she start [place] chlorpheniramine. The pharmacist was worried about this, so instead she got afrin x 3 days.        Objective:   Physical Exam Filed Vitals:   07/20/12 1543  BP: 124/86  Pulse: 94  Temp: 98.3 F (36.8 C)    Gen: Pleasant, overwt, in no  distress on O2,  normal affect  ENT: No lesions,  mouth clear,  oropharynx clear, no postnasal drip  Neck: No JVD, no TMG, no carotid bruits  Lungs: No use of accessory muscles, few B insp crackles  Cardiovascular: RRR, prominent P2  Musculoskeletal: No deformities, no cyanosis or clubbing  Neuro: alert, non focal  Skin: Warm, no lesions or rashes     Assessment & Plan:  PULMONARY SARCOIDOSIS No wheezing - I do not believe this is a flare; sounds more like acute sinusitis + bronchitis  PULMONARY HYPERTENSION - continue same meds and follow 6 min walk end of this month - rov 1 month  Acute sinusitis, unspecified levaquin x 7 days and then reassess

## 2012-07-20 NOTE — Assessment & Plan Note (Signed)
No wheezing - I do not believe this is a flare; sounds more like acute sinusitis + bronchitis

## 2012-07-20 NOTE — Addendum Note (Signed)
Addended by: Collene Gobble on: 07/20/2012 04:18 PM   Modules accepted: Orders

## 2012-07-20 NOTE — Patient Instructions (Addendum)
We will start nasal saline washes daily  Start levaquin 547m daily for the next 7 days continue your other medications as you are taking them Follow with Dr BLamonte Sakaiin 1 month

## 2012-08-03 ENCOUNTER — Encounter: Payer: Self-pay | Admitting: Cardiology

## 2012-08-03 ENCOUNTER — Ambulatory Visit (INDEPENDENT_AMBULATORY_CARE_PROVIDER_SITE_OTHER): Payer: Medicare Other | Admitting: Cardiology

## 2012-08-03 VITALS — BP 116/66 | HR 83 | Ht 63.0 in | Wt 181.0 lb

## 2012-08-03 DIAGNOSIS — I2789 Other specified pulmonary heart diseases: Secondary | ICD-10-CM

## 2012-08-03 DIAGNOSIS — I272 Pulmonary hypertension, unspecified: Secondary | ICD-10-CM

## 2012-08-03 NOTE — Patient Instructions (Addendum)
2 Gram Low Sodium Diet A 2 gram sodium diet restricts the amount of sodium in the diet to no more than 2 g or 2000 mg daily. Limiting the amount of sodium is often used to help lower blood pressure. It is important if you have heart, liver, or kidney problems. Many foods contain sodium for flavor and sometimes as a preservative. When the amount of sodium in a diet needs to be low, it is important to know what to look for when choosing foods and drinks. The following includes some information and guidelines to help make it easier for you to adapt to a low sodium diet. QUICK TIPS  Do not add salt to food.  Avoid convenience items and fast food.  Choose unsalted snack foods.  Buy lower sodium products, often labeled as "lower sodium" or "no salt added."  Check food labels to learn how much sodium is in 1 serving.  When eating at a restaurant, ask that your food be prepared with less salt or none, if possible. READING FOOD LABELS FOR SODIUM INFORMATION The nutrition facts label is a good place to find how much sodium is in foods. Look for products with no more than 500 to 600 mg of sodium per meal and no more than 150 mg per serving. Remember that 2 g = 2000 mg. The food label may also list foods as:  Sodium-free: Less than 5 mg in a serving.  Very low sodium: 35 mg or less in a serving.  Low-sodium: 140 mg or less in a serving.  Light in sodium: 50% less sodium in a serving. For example, if a food that usually has 300 mg of sodium is changed to become light in sodium, it will have 150 mg of sodium.  Reduced sodium: 25% less sodium in a serving. For example, if a food that usually has 400 mg of sodium is changed to reduced sodium, it will have 300 mg of sodium. CHOOSING FOODS Grains  Avoid: Salted crackers and snack items. Some cereals, including instant hot cereals. Bread stuffing and biscuit mixes. Seasoned rice or pasta mixes.  Choose: Unsalted snack items. Low-sodium cereals, oats,  puffed wheat and rice, shredded wheat. English muffins and bread. Pasta. Meats  Avoid: Salted, canned, smoked, spiced, pickled meats, including fish and poultry. Bacon, ham, sausage, cold cuts, hot dogs, anchovies.  Choose: Low-sodium canned tuna and salmon. Fresh or frozen meat, poultry, and fish. Dairy  Avoid: Processed cheese and spreads. Cottage cheese. Buttermilk and condensed milk. Regular cheese.  Choose: Milk. Low-sodium cottage cheese. Yogurt. Sour cream. Low-sodium cheese. Fruits and Vegetables  Avoid: Regular canned vegetables. Regular canned tomato sauce and paste. Frozen vegetables in sauces. Olives. Angie Fava. Relishes. Sauerkraut.  Choose: Low-sodium canned vegetables. Low-sodium tomato sauce and paste. Frozen or fresh vegetables. Fresh and frozen fruit. Condiments  Avoid: Canned and packaged gravies. Worcestershire sauce. Tartar sauce. Barbecue sauce. Soy sauce. Steak sauce. Ketchup. Onion, garlic, and table salt. Meat flavorings and tenderizers.  Choose: Fresh and dried herbs and spices. Low-sodium varieties of mustard and ketchup. Lemon juice. Tabasco sauce. Horseradish. SAMPLE 2 GRAM SODIUM MEAL PLAN Breakfast / Sodium (mg)  1 cup low-fat milk / 893 mg  2 slices whole-wheat toast / 270 mg  1 tbs heart-healthy margarine / 153 mg  1 hard-boiled egg / 139 mg  1 small orange / 0 mg Lunch / Sodium (mg)  1 cup raw carrots / 76 mg   cup hummus / 298 mg  1 cup low-fat milk /  143 mg   cup red grapes / 2 mg  1 whole-wheat pita bread / 356 mg Dinner / Sodium (mg)  1 cup whole-wheat pasta / 2 mg  1 cup low-sodium tomato sauce / 73 mg  3 oz lean ground beef / 57 mg  1 small side salad (1 cup raw spinach leaves,  cup cucumber,  cup yellow bell pepper) with 1 tsp olive oil and 1 tsp red wine vinegar / 25 mg Snack / Sodium (mg)  1 container low-fat vanilla yogurt / 107 mg  3 graham cracker squares / 127 mg Nutrient Analysis  Calories: 2033  Protein:  77 g  Carbohydrate: 282 g  Fat: 72 g  Sodium: 1971 mg Document Released: 03/25/2005 Document Revised: 06/17/2011 Document Reviewed: 06/26/2009 [facility] Patient Information 2013 [place], [place].    You have a follow-up appt with Dr Aundra Dubin Thursday August 28,2014 at 11:30 AM.  I will let you know about Tyvaso in the place of iloprost.

## 2012-08-04 ENCOUNTER — Telehealth: Payer: Self-pay | Admitting: *Deleted

## 2012-08-04 NOTE — Progress Notes (Signed)
Patient ID: Rachel [name], female   DOB: 09-03-1960, 52 y.o.   MRN: [id number] PCP: Dr. 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 She is now on Revatio 80 mg tid and and inhaled Iloprost.  She has been evaluated for heart/lung transplant at [facility] as an inpatient but needed to raise around $10,000 to continue evaluation and has decided against this for now. She has started seeing Dr. Lamonte Sakai for her sarcoid.     Symptoms are stable.  She is able to walk around the grocery store but gets easily fatigued/short of breath with longer distances.  She has not been out much recently due to the pollen.  She has had allergic rhinitis.  No syncope or lightheadedness.  No chest pain.  She is tired of having to use Iloprost 6 times a day.   6 minute walk today: 223 m.      Labs (11/10): BNP 91, creatinine 1.0, K 4  Labs (2/11): K 4.6, creatinine 0.86  Labs (3/11): BNP 58, K 4.2, creatinine 0.8  Labs (10/11): K 4.5, creatinine 0.8, BNP 76  Labs (7/13): LDL 102, K 4.1, creatinine 0.85  ECG: NSR, borderline LVH, nonspecific T wave changes  Allergies (verified):  1) ! * Steroids High Dose  2) Demerol  3) Ultram  4) Darvocet-N 100  5) Doxycycline   Past Medical History:  1. Sarcoidosis: Stage IV pulmonary sarcoid on home O2.  Has been quiescent recently.  She has been referred to [facility] for lung transplant evaluation.  2. Cardiomyopathy: Cardiac MRI in 2004 with no evidence for infiltrative disease but EF 40%. Echo (9/10): EF 60%, severely dilated RV with moderately decreased systolic function, moderate RAE, PASP 83 mmHg.  Echo (1/12): EF 46%, grade I diastolic dysfunction, moderate biatrial enlargement, moderate RV dilation with normal RV systolic function, peak RV-RA gradient 34 mmHg.  Echo (8/13): EF 55-60%, mildly dilated RV with mild systolic dysfunction, PA systolic pressure 45 mmHg.  3. Pulmonary hypertension with  RV failure: Moderate to severe, likely secondary to sarcoidosis and parenchymal lung disease/hypoxic pulmonary vasoconstriction. RHC 5/10 showed PA 67/30 (mean 46) with mean PCWP 6 mmHg. Patient started on Revatio in 5/10 without any change in oxygen saturation. 6 minute walk 7/10: 61 m. Echo (9/10): EF 60%, severely dilated RV with moderately decreased systolic function, moderate RAE, PASP 83 mmHg. RHC (9/10) with mean RA 15, PA 74/36, CI 2.8. Repeat RHC after diuresis with mean RA 3, PA 60/22, mean PCWP 4. Echo (1/11): EF 96-29%, grade I diastolic dysfunction, moderately dilated RV, mild to moderate RV dysfunction, moderate to severe TR, PASP 58 mmHg. 6 minute walk (2/11): 122 m. 6 min walk (6/11): 161.5 m. RHC (6/11): mean RA 11, RV 64/15, PA 63/28 mean 43, mean PCWP 14, CI 2.4 thermodilution and 3.2 Fick, PVR 5.3 WU Fick and 7.25 WU thermodilution. Iloprost begun. 6 min walk (10/11): 158 m. 6 min walk (1/12) 273 m.  6 min walk (7/12) 248 m.  RHC (8/12): mean RA 2, PA 51/24, mean PA 35, mean PCWP 6, CI 3, PVR 5.1 WU.  PASP 45 mmHg on 8/13 echo.  6 minute walk (4/14): 223 m.  4. 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 Device is now nearing ERI. Given end stage lung disease and normalization of LV function, the device will not be replaced and tachy therapies were turned off.  5. GERD  6. Chronic  chest pain. LHC (5/10) with no angiographic coronary disease.  7. Allergic rhinitis,  8. h/o iron deficiency anemia.  9. H/o secondary amenorrhea/irregular menses,  10. Psychosis with high dose steroids.  11. Possible runs of SVT  12. Recurrent Hypokalemia  13. h/o Rotator cuff sprain  14. 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   ROS: All systems reviewed and negative except as per HPI.    Current Outpatient Prescriptions  Medication Sig Dispense Refill  . acetaminophen (TYLENOL) 325 MG tablet Take 650 mg by mouth as needed.        Marland Kitchen albuterol (PROAIR HFA) 108 (90 BASE) MCG/ACT inhaler Inhale 2 puffs into the lungs 4 (four) times daily.  8.5 g  4  . aspirin 81 MG tablet Take 81 mg by mouth daily.        . carvedilol (COREG) 3.125 MG tablet Take 1 tablet (3.125 mg total) by mouth 2 (two) times daily with a meal.  180 tablet  2  . esomeprazole (NEXIUM) 40 MG capsule Take 1 capsule (40 mg total) by mouth daily before breakfast.  90 capsule  2  . furosemide (LASIX) 40 MG tablet Take 1 tablet (40 mg total) by mouth every other day.  45 tablet  2  . ILOPROST IN Inhale into the lungs. Ventavis-- 6 times daily      . levofloxacin (LEVAQUIN) 500 MG tablet Take 1 tablet (500 mg total) by mouth daily.  7 tablet  0  . potassium chloride SA (K-DUR,KLOR-CON) 20 MEQ tablet 2 tabs am and 1 tab po qhs  270 tablet  2  . risperiDONE (RISPERDAL) 0.5 MG tablet Take 1 tablet (0.5 mg total) by mouth at bedtime.  90 tablet  2  . sildenafil (REVATIO) 20 MG tablet take 4  tablets every 8 hours        No current facility-administered medications for this visit.    BP 116/66  Pulse 83  Ht _0  (1.6 m)  Wt 181 lb (82.101 kg)  BMI 32.07 kg/m2  SpO2 94% General: Well-developed,well-nourished,in no acute distress; O2 applied via nasal cannula  Neck: Neck supple, JVP 7 cm. No masses, thyromegaly or abnormal cervical nodes.  Lungs: Clear bilaterally to auscultation and percussion.  Heart: Non-displaced PMI, chest non-tender; regular rate and rhythm, S1, S2 without rubs or gallops. 1/6 systolic murmur LLSB. Loud P2. Carotid upstroke normal, no bruit. Pedals normal pulses. Trace ankle edema  bilaterally. Abdomen: Bowel sounds positive; abdomen soft and non-tender without masses, organomegaly, or hernias noted. No hepatosplenomegaly.  Extremities: No clubbing or cyanosis.  Neurologic: Alert and oriented x 3.  Psych: Normal affect.  Assessment/Plan:  PULMONARY HYPERTENSION  Iloprost was started around 9/11 and uptitrated. Given her parenchymal lung disease, it should target ventilated segments of the lung and limit greater V/Q mismatching.  I am going to continue iloprost (now 5 micrograms 6 x a day) and Revatio for now, but she is getting tired of using Iloprost 6 times a day.  I will look into switching her over to Tyvaso which would only be 4  times a day.  She thinks this would help her quality of life.  Last right heart cath in 8/12 showed improved PA pressure and mildly improved cardiac index on Iloprost. PA pressure and PVR was still high, however. Last echo in 8/75 showed PA systolic pressure 45 mmHg.  6 minute walk was lower today, but still considerably higher than prior to starting pulmonary vasodilators.   - Continue current Lasix. - Echo to follow RV and PA pressure in 8/14.  - As above, I will look into switching from Iloprost to Tyvaso.   [name] 08/04/2012 12:07 AM

## 2012-08-04 NOTE — Telephone Encounter (Signed)
Application for Tyvaso  and records faxed to [place]

## 2012-08-05 ENCOUNTER — Telehealth: Payer: Self-pay | Admitting: Cardiology

## 2012-08-05 NOTE — Telephone Encounter (Signed)
Answered questions.

## 2012-08-05 NOTE — Telephone Encounter (Signed)
New problem   Rachel [name]/[facility] has question about a referral that was entered in for Tyvaso. Please call Rachel [name]

## 2012-08-12 NOTE — Telephone Encounter (Signed)
Spoke with Bridgette from [place]. (call Vickie in the future [phone]) They are working on getting financial assistance for pt for Tyvaso. Pt is aware.

## 2012-08-18 ENCOUNTER — Telehealth: Payer: Self-pay | Admitting: Child and Adolescent Psychiatry

## 2012-08-18 ENCOUNTER — Telehealth: Payer: Self-pay | Admitting: Cardiology

## 2012-08-18 NOTE — Telephone Encounter (Signed)
Marland Kitchen

## 2012-08-18 NOTE — Telephone Encounter (Signed)
Continue Iloprost

## 2012-08-18 NOTE — Telephone Encounter (Signed)
New Problem:    Called in wanting to let Dr. Aundra Dubin know that the patient will not be approved for Tyvaso due to her severe sarcoidosis.  Please call back if you have any questions.

## 2012-08-18 NOTE — Telephone Encounter (Addendum)
Left message for patient that Tyvaso was not approved and to stay on Iloprost per Dr.McLean.

## 2012-08-19 ENCOUNTER — Ambulatory Visit: Payer: Self-pay | Admitting: Emergency Medicine

## 2012-09-01 ENCOUNTER — Encounter: Payer: Self-pay | Admitting: Cardiology

## 2012-09-18 ENCOUNTER — Ambulatory Visit: Payer: Self-pay | Admitting: Emergency Medicine

## 2012-10-05 ENCOUNTER — Encounter: Payer: Self-pay | Admitting: Cardiology

## 2012-10-22 ENCOUNTER — Encounter: Payer: Self-pay | Admitting: Emergency Medicine

## 2012-10-22 ENCOUNTER — Ambulatory Visit (INDEPENDENT_AMBULATORY_CARE_PROVIDER_SITE_OTHER): Payer: Medicare Other | Admitting: Emergency Medicine

## 2012-10-22 ENCOUNTER — Telehealth: Payer: Self-pay | Admitting: Cardiology

## 2012-10-22 ENCOUNTER — Other Ambulatory Visit (INDEPENDENT_AMBULATORY_CARE_PROVIDER_SITE_OTHER): Payer: Medicare Other

## 2012-10-22 VITALS — BP 124/80 | HR 81 | Temp 96.7°F | Ht 63.0 in | Wt 183.2 lb

## 2012-10-22 DIAGNOSIS — I2789 Other specified pulmonary heart diseases: Secondary | ICD-10-CM

## 2012-10-22 DIAGNOSIS — D869 Sarcoidosis, unspecified: Secondary | ICD-10-CM

## 2012-10-22 DIAGNOSIS — T148XXA Other injury of unspecified body region, initial encounter: Secondary | ICD-10-CM

## 2012-10-22 LAB — CBC WITH DIFFERENTIAL/PLATELET
Basophils Absolute: 0 10*3/uL (ref 0.0–0.1)
Basophils Relative: 0 % (ref 0.0–3.0)
Eosinophils Absolute: 0.5 10*3/uL (ref 0.0–0.7)
HCT: 34.5 % — ABNORMAL LOW (ref 36.0–46.0)
Hemoglobin: 11.3 g/dL — ABNORMAL LOW (ref 12.0–15.0)
Lymphs Abs: 1.5 10*3/uL (ref 0.7–4.0)
MCHC: 32.9 g/dL (ref 30.0–36.0)
MCV: 88.2 fl (ref 78.0–100.0)
Monocytes Absolute: 0.2 10*3/uL (ref 0.1–1.0)
Neutro Abs: 5.4 10*3/uL (ref 1.4–7.7)
RBC: 3.91 Mil/uL (ref 3.87–5.11)
RDW: 13.8 % (ref 11.5–14.6)

## 2012-10-22 NOTE — Assessment & Plan Note (Signed)
-  check CBC

## 2012-10-22 NOTE — Telephone Encounter (Signed)
I spoke with pt and she stated someone from the doctor's office called but did not leave a message. She saw her pulmonologist & it was not their office. From what I can tell it may have been to reschedule her August 28th appt with Dr. Aundra Dubin.  I did not cancel the 12/03/12 appointment but it looks like Dr. Aundra Dubin may not be in the office that day.  I have rescheduled her to September 29th & reassured her.  She stated it really upset her b/c she didn't know why she was called. Reassurance given  Will forward to [facility] for review.  Horton Chin RN

## 2012-10-22 NOTE — Patient Instructions (Addendum)
Please continue your ventavis and revatio as you are taking them Wear your oxygen at all times Blood work today Follow with Dr Lamonte Sakai in 4 months or sooner if you have any problems.

## 2012-10-22 NOTE — Addendum Note (Signed)
Addended by: Collene Gobble on: 10/22/2012 12:38 PM   Modules accepted: Orders

## 2012-10-22 NOTE — Assessment & Plan Note (Signed)
-  continue revatio + ventavis + O2 - not on anticoagulation right now - TTE in 8/14

## 2012-10-22 NOTE — Telephone Encounter (Signed)
New Prob     Pt calling in regards to a call she received today. Please call back.

## 2012-10-22 NOTE — Assessment & Plan Note (Signed)
Check ACE level today

## 2012-10-22 NOTE — Progress Notes (Signed)
  Subjective:    Patient ID: Rachel [name], female    DOB: 1960-08-15, 52 y.o.   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 She is now on Revatio 80 mg tid and and inhaled Iloprost (added in 2011), managed by Dr Aundra Dubin at [place]. 6 min walk (7/12) 248 m. RHC (8/12): mean RA 2, PA 51/24, mean PA 35, mean PCWP 6, CI 3, PVR 5.1 WU. She believes that she is doing very well. She wears O2 at 6L/min at all times. She believes she knows when her sarcoidosis is active, remains on flovent.   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 She is now on Revatio 80 mg tid and and inhaled Iloprost (added in 2011). Hasn't has 6 min walk yet. Stopped flovent last time to see if she would miss it.  She is having a bit more exertional SOB w chores, thinks this may be due in part to deconditioning.   CXR today >> improved LLL atx., scattered B infiltrates.   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 She is now on Revatio 80 mg tid and and inhaled Iloprost (added in 2011).  Lately she has been having some increased DOE, some exertional CP. Last 6 minute walk >> never had it yet. Planning to do next month after her ankle is evaluated due to pain when walking (prior fracture).   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 She is now on Revatio 80 mg tid and and inhaled Iloprost.  She has been having nasal congestion and overall more dyspnea for the last . I recommended that she start [place] chlorpheniramine. The pharmacist was worried about this, so instead she got afrin x 3 days.    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 She is now on Revatio 80 mg tid and and inhaled  Ventavis. She was looking into change to Tyvaso, but apparently insurance wouldn't cover it?? Not clear why this was refused.   She was having congestion last time > took levaquin and started A[place] spray, improved. Her breathing is at baseline, but she remains quite limited. She can tell when her volume status is up. Planning for TTE in 8/14. 6 minute walk was performed in April, I don't have the data available. She c/o some frequent bruising.       Objective:   Physical Exam Filed Vitals:   10/22/12 1214  BP: 124/80  Pulse: 81  Temp:     Gen: Pleasant, overwt, in no distress on O2,  normal affect  ENT: No lesions,  mouth clear,  oropharynx clear, no postnasal drip  Neck: No JVD, no TMG, no carotid bruits  Lungs: No use of accessory muscles, few B insp crackles  Cardiovascular: RRR, prominent P2  Musculoskeletal: No deformities, no cyanosis or clubbing  Neuro: alert, non focal  Skin: Warm, no lesions or rashes     Assessment & Plan:  PULMONARY SARCOIDOSIS Check ACE level today   PULMONARY HYPERTENSION - continue revatio + ventavis + O2 - not on anticoagulation right now - TTE in 8/14  Bruising - check CBC

## 2012-10-23 NOTE — Telephone Encounter (Signed)
I did not call her. I will forward to [place] in scheduling.

## 2012-10-23 NOTE — Telephone Encounter (Signed)
I will forward to [place] in scheduling to check appt times.

## 2012-11-02 ENCOUNTER — Telehealth: Payer: Self-pay | Admitting: Cardiology

## 2012-11-02 NOTE — Telephone Encounter (Signed)
I spoke with Gerald Stabs at [employer] about [employer] [phone]. He states no information needed from Dr Aundra Dubin, pt needs complete application mailed to her. Pt advised, verbalized understanding.

## 2012-11-02 NOTE — Telephone Encounter (Signed)
Spoke with patient. Pt states she received notification that it is time to renew her Ventavis patient assistance application.

## 2012-11-02 NOTE — Telephone Encounter (Signed)
LMTCB for nurse at [phone].

## 2012-11-02 NOTE — Telephone Encounter (Signed)
New problem  Only information pt disclosed is she wants you to return her call

## 2012-11-30 ENCOUNTER — Telehealth: Payer: Self-pay | Admitting: Emergency Medicine

## 2012-11-30 NOTE — Telephone Encounter (Signed)
LMOMTCB x1 for pt 

## 2012-12-01 NOTE — Telephone Encounter (Signed)
Spoke with patient, made her aware of results as listed below per Dr. Lamonte Sakai Patient verbalized understanding and nothing further needed at this time

## 2012-12-01 NOTE — Telephone Encounter (Signed)
Dr. Lamonte Sakai can you please advise on the lab result from the pt last visit on 10/22/12. [place] Bing, CMA

## 2012-12-01 NOTE — Telephone Encounter (Signed)
Please let the pt know that her blood counts were normal and that her ACE level was 38 (normal range, not significantly changed from her priors).

## 2012-12-03 ENCOUNTER — Ambulatory Visit: Payer: Self-pay | Admitting: Cardiology

## 2012-12-30 ENCOUNTER — Ambulatory Visit: Payer: Self-pay | Admitting: Family Medicine

## 2013-01-04 ENCOUNTER — Ambulatory Visit (INDEPENDENT_AMBULATORY_CARE_PROVIDER_SITE_OTHER): Payer: Medicare Other | Admitting: Cardiology

## 2013-01-04 ENCOUNTER — Encounter: Payer: Self-pay | Admitting: Cardiology

## 2013-01-04 ENCOUNTER — Ambulatory Visit: Payer: Self-pay | Admitting: Cardiology

## 2013-01-04 VITALS — BP 148/83 | HR 82 | Ht 63.0 in | Wt 177.0 lb

## 2013-01-04 DIAGNOSIS — I279 Pulmonary heart disease, unspecified: Secondary | ICD-10-CM

## 2013-01-04 DIAGNOSIS — I2789 Other specified pulmonary heart diseases: Secondary | ICD-10-CM

## 2013-01-04 DIAGNOSIS — I10 Essential (primary) hypertension: Secondary | ICD-10-CM

## 2013-01-04 DIAGNOSIS — R0602 Shortness of breath: Secondary | ICD-10-CM

## 2013-01-04 DIAGNOSIS — I422 Other hypertrophic cardiomyopathy: Secondary | ICD-10-CM

## 2013-01-04 DIAGNOSIS — D869 Sarcoidosis, unspecified: Secondary | ICD-10-CM

## 2013-01-04 DIAGNOSIS — I509 Heart failure, unspecified: Secondary | ICD-10-CM

## 2013-01-04 LAB — LIPID PANEL: Total CHOL/HDL Ratio: 2

## 2013-01-04 LAB — BASIC METABOLIC PANEL
BUN: 14 mg/dL (ref 6–23)
Creatinine, Ser: 0.9 mg/dL (ref 0.4–1.2)
GFR: 83.31 mL/min (ref >60.00)
Glucose, Bld: 96 mg/dL (ref 70–99)

## 2013-01-04 LAB — BRAIN NATRIURETIC PEPTIDE: Pro B Natriuretic peptide (BNP): 22 pg/mL (ref 0.0–100.0)

## 2013-01-04 NOTE — Patient Instructions (Addendum)
Your physician recommends that you return for lab work today--BMET/BNP/lipid profile  Your physician has requested that you have an echocardiogram. Echocardiography is a painless test that uses sound waves to create images of your heart. It provides your doctor with information about the size and shape of your heart and how well your heart's chambers and valves are working. This procedure takes approximately one hour. There are no restrictions for this procedure.  Your physician wants you to follow-up in: 4 months with Dr Aundra Dubin. (January 2015).  You will receive a reminder letter in the mail two months in advance. If you don't receive a letter, please call our office to schedule the follow-up appointment.

## 2013-01-04 NOTE — Progress Notes (Signed)
Patient ID: Rachel [name], female   DOB: 09-07-1960, 52 y.o.   MRN: [id number] PCP: Dr. 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 She is now on Revatio 80 mg tid and and inhaled Iloprost.  She has been evaluated for heart/lung transplant at [facility] as an inpatient but needed to raise around $10,000 to continue evaluation and has decided against this for now. She has been seeing Dr. Lamonte Sakai for her sarcoid.     Symptoms are stable to improved.  She has started walking in the mall for exercise and feels like her exercise tolerance is improved. Weight is down 4 lbs.  No syncope or lightheadedness.  No chest pain. She is tired of having to use Iloprost 6 times a day but we were unable to get approval for Tyvaso.   6 minute walk today: 299 m.      Labs (11/10): BNP 91, creatinine 1.0, K 4  Labs (2/11): K 4.6, creatinine 0.86  Labs (3/11): BNP 58, K 4.2, creatinine 0.8  Labs (10/11): K 4.5, creatinine 0.8, BNP 76  Labs (7/13): LDL 102, K 4.1, creatinine 0.85  ECG: NSR, nonspecific T wave changes  Allergies (verified):  1) ! * Steroids High Dose  2) Demerol  3) Ultram  4) Darvocet-N 100  5) Doxycycline   Past Medical History:  1. Sarcoidosis: Stage IV pulmonary sarcoid on home O2.  Has been quiescent recently.  She has been referred to [facility] for lung transplant evaluation.  2. Cardiomyopathy: Cardiac MRI in 2004 with no evidence for infiltrative disease but EF 40%. Echo (9/10): EF 60%, severely dilated RV with moderately decreased systolic function, moderate RAE, PASP 83 mmHg.  Echo (1/12): EF 25%, grade I diastolic dysfunction, moderate biatrial enlargement, moderate RV dilation with normal RV systolic function, peak RV-RA gradient 34 mmHg.  Echo (8/13): EF 55-60%, mildly dilated RV with mild systolic dysfunction, PA systolic pressure 45 mmHg.  3. Pulmonary hypertension with RV failure: Moderate to severe,  likely secondary to sarcoidosis and parenchymal lung disease/hypoxic pulmonary vasoconstriction. RHC 5/10 showed PA 67/30 (mean 46) with mean PCWP 6 mmHg. Patient started on Revatio in 5/10 without any change in oxygen saturation. 6 minute walk 7/10: 61 m. Echo (9/10): EF 60%, severely dilated RV with moderately decreased systolic function, moderate RAE, PASP 83 mmHg. RHC (9/10) with mean RA 15, PA 74/36, CI 2.8. Repeat RHC after diuresis with mean RA 3, PA 60/22, mean PCWP 4. Echo (1/11): EF 42-70%, grade I diastolic dysfunction, moderately dilated RV, mild to moderate RV dysfunction, moderate to severe TR, PASP 58 mmHg. 6 minute walk (2/11): 122 m. 6 min walk (6/11): 161.5 m. RHC (6/11): mean RA 11, RV 64/15, PA 63/28 mean 43, mean PCWP 14, CI 2.4 thermodilution and 3.2 Fick, PVR 5.3 WU Fick and 7.25 WU thermodilution. Iloprost begun. 6 min walk (10/11): 158 m. 6 min walk (1/12) 273 m.  6 min walk (7/12) 248 m.  RHC (8/12): mean RA 2, PA 51/24, mean PA 35, mean PCWP 6, CI 3, PVR 5.1 WU.  PASP 45 mmHg on 8/13 echo.  6 minute walk (4/14): 223 m. 6 minute walk (9/14) 299 m.  4. 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 Device is now nearing ERI. Given end stage lung disease and normalization of LV function, the device will not be replaced and tachy therapies were turned off.  5. GERD  6. Chronic  chest pain. LHC (5/10) with no angiographic coronary disease.  7. Allergic rhinitis,  8. h/o iron deficiency anemia.  9. H/o secondary amenorrhea/irregular menses,  10. Psychosis with high dose steroids.  11. Possible runs of SVT  12. Recurrent Hypokalemia  13. h/o Rotator cuff sprain  14. 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   ROS: All systems reviewed and negative except as per HPI.    Current Outpatient Prescriptions  Medication Sig Dispense Refill  . acetaminophen (TYLENOL) 325 MG tablet Take 650 mg by mouth as needed.        Marland Kitchen albuterol (PROAIR HFA) 108 (90 BASE) MCG/ACT inhaler Inhale 2 puffs into the lungs 4 (four) times daily.  8.5 g  4  . aspirin 81 MG tablet Take 81 mg by mouth daily.        . carvedilol (COREG) 3.125 MG tablet Take 1 tablet (3.125 mg total) by mouth 2 (two) times daily with a meal.  180 tablet  2  . esomeprazole (NEXIUM) 40 MG capsule Take 1 capsule (40 mg total) by mouth daily before breakfast.  90 capsule  2  . furosemide (LASIX) 40 MG tablet Take 1 tablet (40 mg total) by mouth every other day.  45 tablet  2  . ILOPROST IN Inhale into the lungs. Ventavis-- 6 times daily      . potassium chloride SA (K-DUR,KLOR-CON) 20 MEQ tablet 2 tabs am and 1 tab po qhs  270 tablet  2  . risperiDONE (RISPERDAL) 0.5 MG tablet Take 1 tablet (0.5 mg total) by mouth at bedtime.  90 tablet  2  . sildenafil (REVATIO) 20 MG tablet take 4  tablets every 8 hours        No current facility-administered medications for this visit.    BP 148/83  Pulse 82  Ht _0  (1.6 m)  Wt 80.287 kg (177 lb)  BMI 31.36 kg/m2 General: Well-developed,well-nourished,in no acute distress; O2 applied via nasal cannula  Neck: Neck supple, JVP 7-8 cm. No masses, thyromegaly or abnormal cervical nodes.  Lungs: Clear bilaterally to auscultation and percussion.  Heart: Non-displaced PMI, chest non-tender; regular rate and rhythm, S1, S2 without rubs or gallops. 1/6 systolic murmur LLSB. Loud P2. Carotid upstroke normal, no bruit. Pedals normal pulses. Trace ankle edema bilaterally. Abdomen: Bowel sounds positive; abdomen soft and non-tender without masses, organomegaly, or hernias noted. No  hepatosplenomegaly.  Extremities: No clubbing or cyanosis.  Neurologic: Alert and oriented x 3.  Psych: Normal affect.  Assessment/Plan:  PULMONARY HYPERTENSION  Iloprost was started around 9/11 and uptitrated. Given her parenchymal lung disease, it should target ventilated segments of the lung and limit greater V/Q mismatching.  I am going to continue iloprost (now 5 micrograms 6 x a day) and Revatio for now, but she is getting tired of using Iloprost 6 times a day.  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  She thinks  this would help her quality of life.  Last right heart cath in 8/12 showed improved PA pressure and mildly improved cardiac index on Iloprost. PA pressure and PVR was still high, however. Last echo in 3/09 showed PA systolic pressure 45 mmHg.  She is exercising more and 6 minute walk is improved.    - Continue current Lasix. - Echo to follow RV and PA pressure.   - As above, I will again look into switching from Iloprost to Tyvaso. - BMET, BNP, lipids today.    [name] 01/04/2013 2:11 PM

## 2013-01-05 ENCOUNTER — Telehealth: Payer: Self-pay | Admitting: Cardiology

## 2013-01-05 DIAGNOSIS — I279 Pulmonary heart disease, unspecified: Secondary | ICD-10-CM

## 2013-01-05 NOTE — Telephone Encounter (Signed)
New Problem  Pt request lab test results.

## 2013-01-05 NOTE — Telephone Encounter (Signed)
Pt called for lab results done yesterday 01/04/13.  Results given to pt. She is aware that MD  Has not  reviewed results, Pt is aware that she  will be call  back with any  recommendations.

## 2013-01-06 NOTE — Telephone Encounter (Signed)
Per Loletha Carrow [phone] does not quality for patient assistance for Tyvaso.

## 2013-01-06 NOTE — Telephone Encounter (Signed)
I spoke with Vickie at [facility].

## 2013-01-06 NOTE — Telephone Encounter (Signed)
Attempted to contact patient by telephone to let her know  , unable to leave voice message.

## 2013-01-07 ENCOUNTER — Other Ambulatory Visit (HOSPITAL_COMMUNITY): Payer: Self-pay | Admitting: Cardiology

## 2013-01-07 ENCOUNTER — Other Ambulatory Visit (HOSPITAL_COMMUNITY): Payer: Self-pay | Admitting: Radiology

## 2013-01-07 ENCOUNTER — Ambulatory Visit (HOSPITAL_COMMUNITY): Payer: Medicare Other | Attending: Cardiology | Admitting: Radiology

## 2013-01-07 DIAGNOSIS — I059 Rheumatic mitral valve disease, unspecified: Secondary | ICD-10-CM | POA: Insufficient documentation

## 2013-01-07 DIAGNOSIS — R0602 Shortness of breath: Secondary | ICD-10-CM

## 2013-01-07 DIAGNOSIS — D869 Sarcoidosis, unspecified: Secondary | ICD-10-CM | POA: Insufficient documentation

## 2013-01-07 DIAGNOSIS — R0989 Other specified symptoms and signs involving the circulatory and respiratory systems: Secondary | ICD-10-CM | POA: Insufficient documentation

## 2013-01-07 DIAGNOSIS — Z87891 Personal history of nicotine dependence: Secondary | ICD-10-CM | POA: Insufficient documentation

## 2013-01-07 DIAGNOSIS — I319 Disease of pericardium, unspecified: Secondary | ICD-10-CM | POA: Insufficient documentation

## 2013-01-07 DIAGNOSIS — I428 Other cardiomyopathies: Secondary | ICD-10-CM

## 2013-01-07 DIAGNOSIS — I422 Other hypertrophic cardiomyopathy: Secondary | ICD-10-CM | POA: Insufficient documentation

## 2013-01-07 DIAGNOSIS — I279 Pulmonary heart disease, unspecified: Secondary | ICD-10-CM

## 2013-01-07 DIAGNOSIS — J4489 Other specified chronic obstructive pulmonary disease: Secondary | ICD-10-CM | POA: Insufficient documentation

## 2013-01-07 DIAGNOSIS — I10 Essential (primary) hypertension: Secondary | ICD-10-CM | POA: Insufficient documentation

## 2013-01-07 DIAGNOSIS — R0609 Other forms of dyspnea: Secondary | ICD-10-CM | POA: Insufficient documentation

## 2013-01-07 DIAGNOSIS — Z9981 Dependence on supplemental oxygen: Secondary | ICD-10-CM | POA: Insufficient documentation

## 2013-01-07 DIAGNOSIS — I509 Heart failure, unspecified: Secondary | ICD-10-CM | POA: Insufficient documentation

## 2013-01-07 DIAGNOSIS — R011 Cardiac murmur, unspecified: Secondary | ICD-10-CM | POA: Insufficient documentation

## 2013-01-07 DIAGNOSIS — J449 Chronic obstructive pulmonary disease, unspecified: Secondary | ICD-10-CM | POA: Insufficient documentation

## 2013-01-07 DIAGNOSIS — I2789 Other specified pulmonary heart diseases: Secondary | ICD-10-CM | POA: Insufficient documentation

## 2013-01-07 NOTE — Progress Notes (Signed)
Echocardiogram performed.

## 2013-01-07 NOTE — Telephone Encounter (Signed)
Unable to leave message for pt to call

## 2013-01-11 NOTE — Telephone Encounter (Signed)
Spoke with patient.

## 2013-01-11 NOTE — Telephone Encounter (Signed)
Follow up  ° ° ° °Returning call back to nurse  °

## 2013-01-11 NOTE — Addendum Note (Signed)
Addended by: Katrine Coho on: 01/11/2013 11:46 AM   Modules accepted: Orders

## 2013-01-12 ENCOUNTER — Ambulatory Visit (INDEPENDENT_AMBULATORY_CARE_PROVIDER_SITE_OTHER): Payer: Medicare Other | Admitting: Family Medicine

## 2013-01-12 ENCOUNTER — Encounter: Payer: Self-pay | Admitting: Family Medicine

## 2013-01-12 VITALS — BP 141/75 | HR 81 | Temp 97.8°F | Wt 177.0 lb

## 2013-01-12 DIAGNOSIS — Z23 Encounter for immunization: Secondary | ICD-10-CM

## 2013-01-12 DIAGNOSIS — I5022 Chronic systolic (congestive) heart failure: Secondary | ICD-10-CM

## 2013-01-12 DIAGNOSIS — K219 Gastro-esophageal reflux disease without esophagitis: Secondary | ICD-10-CM

## 2013-01-12 DIAGNOSIS — J449 Chronic obstructive pulmonary disease, unspecified: Secondary | ICD-10-CM

## 2013-01-12 DIAGNOSIS — F209 Schizophrenia, unspecified: Secondary | ICD-10-CM

## 2013-01-12 MED ORDER — CARVEDILOL 3.125 MG PO TABS: 3.1250 mg | ORAL_TABLET | Freq: Two times a day (BID) | ORAL | Status: DC

## 2013-01-12 MED ORDER — POTASSIUM CHLORIDE CRYS ER 20 MEQ PO TBCR: EXTENDED_RELEASE_TABLET | ORAL | Status: DC

## 2013-01-12 MED ORDER — ALBUTEROL SULFATE HFA 108 (90 BASE) MCG/ACT IN AERS: 2.0000 | INHALATION_SPRAY | Freq: Four times a day (QID) | RESPIRATORY_TRACT | Status: DC

## 2013-01-12 MED ORDER — FUROSEMIDE 40 MG PO TABS: 40.0000 mg | ORAL_TABLET | ORAL | Status: DC

## 2013-01-12 MED ORDER — ESOMEPRAZOLE MAGNESIUM 40 MG PO CPDR: 40.0000 mg | DELAYED_RELEASE_CAPSULE | Freq: Every day | ORAL | Status: DC

## 2013-01-12 MED ORDER — RISPERIDONE 0.5 MG PO TABS: 0.5000 mg | ORAL_TABLET | Freq: Every day | ORAL | Status: DC

## 2013-01-12 NOTE — Patient Instructions (Signed)
I think that you are doing well, and I am glad to have you as a patient. Please come back and see me in 3 months.   Sincerely,   Dr. Maricela Bo

## 2013-01-12 NOTE — Progress Notes (Signed)
  Subjective:    Patient ID: Rachel [name], female    DOB: 18-Sep-1960, 52 y.o.   MRN: [id number]  HPI  Visit with patient who is new to me. She has pulmonary sarcoidosis and CHF. She was recently seen by her cardiologist. Today, she denies any active complaints and believes that she is doing well. She would like a flu vaccine and refills on her medications.   Cancer Screening - patient has not had colon cancer screening   Review of Systems Patient denies shortness of breath, chest pain, ankle edema    Objective:   Physical Exam  BP 141/75  Pulse 81  Temp(Src) 97.8 F (36.6 C) (Oral)  Wt 177 lb (80.287 kg)  BMI 31.36 kg/m2 Gen.: middle-aged female, well-appearing, pleasant and conversant, nasal cannula in place Cardiovascular: regular rate and rhythm, mild systolic murmur Lung: normal work of breathing, scattered crackles and upper lobes Ankles: no peripheral edema      Assessment & Plan:  Colon cancer screening: patient counseled and benefits of colon cancer screening in situ "think about it"  Influenza vaccine - administered today   Medications refilled  Patient will follow up in 3 months

## 2013-01-19 ENCOUNTER — Telehealth: Payer: Self-pay | Admitting: *Deleted

## 2013-01-19 NOTE — Telephone Encounter (Signed)
Patient renewed VENTAVIS patient assistance program for 1 year, [place], Patient Dietitian.

## 2013-03-16 ENCOUNTER — Encounter: Payer: Self-pay | Admitting: Emergency Medicine

## 2013-03-16 ENCOUNTER — Ambulatory Visit (INDEPENDENT_AMBULATORY_CARE_PROVIDER_SITE_OTHER): Payer: Medicare Other | Admitting: Emergency Medicine

## 2013-03-16 VITALS — BP 130/88 | HR 87 | Ht 63.0 in | Wt 178.4 lb

## 2013-03-16 DIAGNOSIS — D869 Sarcoidosis, unspecified: Secondary | ICD-10-CM

## 2013-03-16 DIAGNOSIS — I2789 Other specified pulmonary heart diseases: Secondary | ICD-10-CM

## 2013-03-16 MED ORDER — SPACER/AERO CHAMBER MOUTHPIECE MISC: Status: DC

## 2013-03-16 NOTE — Patient Instructions (Signed)
Please start Symbicort 160/4.12mg, 2 puffs twice a day Use your ProAir 2 puffs if needed for shortness of breath Continue your Ventavis and Revatio as you have been taking We're your oxygen at all times Follow with Dr. MMarigene Ehlers Unclear whether you can have a cardiac MRI due to your pacemaker leads.  Follow with Dr BLamonte Sakaiin 1 month

## 2013-03-16 NOTE — Assessment & Plan Note (Signed)
Likely currently undertreated for her sarcoid - Full PFT - Trial of Symbicort twice a day - Change albuterol to when necessary - ROV1 mo

## 2013-03-16 NOTE — Progress Notes (Signed)
Subjective:    Patient ID: Rachel [name], female    DOB: 10/12/1960, 52 y.o.   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 She is now on Revatio 80 mg tid and and inhaled Iloprost (added in 2011), managed by Dr Aundra Dubin at [facility]. 6 min walk (7/12) 248 m. RHC (8/12): mean RA 2, PA 51/24, mean PA 35, mean PCWP 6, CI 3, PVR 5.1 WU. She believes that she is doing very well. She wears O2 at 6L/min at all times. She believes she knows when her sarcoidosis is active, remains on flovent.   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 She is now on Revatio 80 mg tid and and inhaled Iloprost (added in 2011). Hasn't has 6 min walk yet. Stopped flovent last time to see if she would miss it.  She is having a bit more exertional SOB w chores, thinks this may be due in part to deconditioning.   CXR today >> improved LLL atx., scattered B infiltrates.   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 She is now on Revatio 80 mg tid and and inhaled Iloprost (added in 2011).  Lately she has been having some increased DOE, some exertional CP. Last 6 minute walk >> never had it yet. Planning to do next month after her ankle is evaluated due to pain when walking (prior fracture).   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 She is now on Revatio 80 mg tid and and inhaled Iloprost.  She has been having nasal congestion and overall more dyspnea for the last . I recommended that she start NPaisley chlorpheniramine. The pharmacist was worried about this, so instead she got afrin x 3 days.    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 She is now on Revatio 80 mg tid and and inhaled  Ventavis. She was looking into change to Tyvaso, but apparently insurance wouldn't cover it?? Not clear why this was refused.   She was having congestion last time > took levaquin and started Ayr spray, improved. Her breathing is at baseline, but she remains quite limited. She can tell when her volume status is up. Planning for TTE in 8/14. 6 minute walk was performed in April, I don't have the data available. She c/o some frequent bruising.   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 Followed by Dr MAundra Dubin> current PAH meds are Revatio 80 tid + Ventavis. Most recent echocardiogram in 01/07/13 showed estimated PASP 44 mmHg (stable), but decreased EF. Cardiac MRI was discussed to look for infiltrative sarcoidosis, but this has not been done - note she has pacer wires in place. She has some bad days, DOE especially. She uses ProAir with good effect.       Objective:   Physical Exam Filed Vitals:   03/16/13 0947  BP: 130/88  Pulse: 87  Height: _0  (1.6 m)  Weight: 178 lb 6.4 oz (80.922 kg)  SpO2: 96%   Gen: Pleasant, overwt, in no distress on O2,  normal affect  ENT: No lesions,  mouth clear,  oropharynx clear, no postnasal drip  Neck: No JVD, no TMG,  no carotid bruits  Lungs: No use of accessory muscles, few B insp crackles  Cardiovascular: RRR, prominent P2  Musculoskeletal: No deformities, no cyanosis or clubbing  Neuro: alert, non focal  Skin: Warm, no lesions or rashes    01/07/13 --  Study Conclusions - Left ventricle: The cavity size was normal. Wall thickness was normal. Systolic function was mildly to moderately reduced. The estimated ejection fraction was in the range of 40% to 45%. Wall motion was normal; there were no regional wall motion abnormalities. Doppler parameters are consistent with abnormal left ventricular relaxation (grade 1 diastolic dysfunction). - Mitral valve: Mild regurgitation. - Right  ventricle: The cavity size was mildly dilated. Systolic function was mildly reduced. - Atrial septum: No defect or patent foramen ovale was identified. - Pulmonary arteries: PA peak pressure: 93m Hg (S). - Pericardium, extracardiac: A trivial pericardial effusion was identified.      Assessment & Plan:  PULMONARY SARCOIDOSIS Likely currently undertreated for her sarcoid - Full PFT - Trial of Symbicort twice a day - Change albuterol to when necessary - ROV1 mo  PULMONARY HYPERTENSION Stable PASP but decreased LV function.  - cardiac MRI for infiltrative sarcoidosis has been discussed but she has history of pacemaker and is not clear to me whether an MRI will be possible. The pacemaker is no longer active and may be able to be removed. She will have to discuss this with Dr. MMarigene Ehlers- continue ventavis and revatio

## 2013-03-16 NOTE — Addendum Note (Signed)
Addended by: Carlos American A on: 03/16/2013 10:26 AM   Modules accepted: Orders

## 2013-03-16 NOTE — Assessment & Plan Note (Signed)
Stable PASP but decreased LV function.  - cardiac MRI for infiltrative sarcoidosis has been discussed but she has history of pacemaker and is not clear to me whether an MRI will be possible. The pacemaker is no longer active and may be able to be removed. She will have to discuss this with Dr. Marigene Ehlers - continue ventavis and revatio

## 2013-03-22 ENCOUNTER — Telehealth: Payer: Self-pay | Admitting: *Deleted

## 2013-03-22 NOTE — Telephone Encounter (Signed)
  Webb Silversmith, please read email below from [facility]  Previous Messages     ----- Message -----  From: Minda Ditto  Sent: 02/12/2013 11:21 AM  To: Rennis Petty,   This patient has a defibrillator and therefore cannot have a MRI.    Dr Aundra Dubin advised

## 2013-04-05 ENCOUNTER — Telehealth: Payer: Self-pay | Admitting: Emergency Medicine

## 2013-04-05 MED ORDER — BUDESONIDE-FORMOTEROL FUMARATE 160-4.5 MCG/ACT IN AERO: 2.0000 | INHALATION_SPRAY | Freq: Two times a day (BID) | RESPIRATORY_TRACT | Status: DC

## 2013-04-05 NOTE — Telephone Encounter (Signed)
Pt aware. Will come pick up today or tomorrow.

## 2013-04-05 NOTE — Telephone Encounter (Signed)
lmomtcb x1 for pt--sample left for pick up of symbicort

## 2013-04-27 ENCOUNTER — Telehealth: Payer: Self-pay | Admitting: Emergency Medicine

## 2013-04-27 NOTE — Telephone Encounter (Signed)
Spoke with pt. She is aware she is not allowed any inhaler use 4 hrs prior to the test. Nothing further needed

## 2013-04-29 ENCOUNTER — Ambulatory Visit (INDEPENDENT_AMBULATORY_CARE_PROVIDER_SITE_OTHER): Payer: Medicare Other | Admitting: Emergency Medicine

## 2013-04-29 ENCOUNTER — Encounter: Payer: Self-pay | Admitting: Emergency Medicine

## 2013-04-29 VITALS — BP 128/84 | HR 72 | Ht 63.0 in | Wt 181.0 lb

## 2013-04-29 DIAGNOSIS — D869 Sarcoidosis, unspecified: Secondary | ICD-10-CM

## 2013-04-29 DIAGNOSIS — I2789 Other specified pulmonary heart diseases: Secondary | ICD-10-CM

## 2013-04-29 LAB — PULMONARY FUNCTION TEST
DL/VA % PRED: 51 %
DL/VA: 2.43 ml/min/mmHg/L
DLCO unc % pred: 31 %
DLCO unc: 7.12 ml/min/mmHg
FEF 25-75 PRE: 0.98 L/s
FEF 25-75 Post: 0.95 L/sec
FEF2575-%Change-Post: -3 %
FEF2575-%PRED-PRE: 43 %
FEF2575-%Pred-Post: 41 %
FEV1-%Change-Post: -2 %
FEV1-%Pred-Post: 49 %
FEV1-%Pred-Pre: 50 %
FEV1-PRE: 1.1 L
FEV1-Post: 1.07 L
FEV1FVC-%Change-Post: -4 %
FEV1FVC-%Pred-Pre: 98 %
FEV6-%CHANGE-POST: 1 %
FEV6-%PRED-POST: 52 %
FEV6-%Pred-Pre: 52 %
FEV6-POST: 1.4 L
FEV6-PRE: 1.38 L
FEV6FVC-%Pred-Post: 103 %
FEV6FVC-%Pred-Pre: 103 %
FVC-%Change-Post: 1 %
FVC-%PRED-POST: 51 %
FVC-%PRED-PRE: 50 %
FVC-POST: 1.4 L
FVC-Pre: 1.38 L
POST FEV6/FVC RATIO: 100 %
Post FEV1/FVC ratio: 76 %
Pre FEV1/FVC ratio: 80 %
Pre FEV6/FVC Ratio: 100 %

## 2013-04-29 MED ORDER — BUDESONIDE-FORMOTEROL FUMARATE 160-4.5 MCG/ACT IN AERO: 1.0000 | INHALATION_SPRAY | Freq: Two times a day (BID) | RESPIRATORY_TRACT | Status: DC

## 2013-04-29 NOTE — Patient Instructions (Signed)
Please decrease your Symbicort to 1 inhalation twice a day Continue your oxygen at all times Your PFT's are stable compared with 2009 Call our office in 3 weeks to report whether you want to continue the Symbicort Follow with Dr Lamonte Sakai in 6 months or sooner if you have any problems

## 2013-04-29 NOTE — Assessment & Plan Note (Signed)
-  continue ventavis and revatio

## 2013-04-29 NOTE — Progress Notes (Signed)
PFT done today. Jenie Parish, CMA  

## 2013-04-29 NOTE — Assessment & Plan Note (Signed)
Seems to have improved from the symbicort but she is concerned about possible corticosteroid absorption and side effects. I explained that this is possible but unlikely. For now we will decrease the Symbicort

## 2013-04-29 NOTE — Progress Notes (Signed)
Subjective:    Patient ID: Rachel [name], female    DOB: Feb 24, 1961, 53 y.o.   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 She is now on Revatio 80 mg tid and and inhaled Iloprost (added in 2011), managed by Dr Aundra Dubin at [facility]. 6 min walk (7/12) 248 m. RHC (8/12): mean RA 2, PA 51/24, mean PA 35, mean PCWP 6, CI 3, PVR 5.1 WU. She believes that she is doing very well. She wears O2 at 6L/min at all times. She believes she knows when her sarcoidosis is active, remains on flovent.   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 She is now on Revatio 80 mg tid and and inhaled Iloprost (added in 2011). Hasn't has 6 min walk yet. Stopped flovent last time to see if she would miss it.  She is having a bit more exertional SOB w chores, thinks this may be due in part to deconditioning.   CXR today >> improved LLL atx., scattered B infiltrates.   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 She is now on Revatio 80 mg tid and and inhaled Iloprost (added in 2011).  Lately she has been having some increased DOE, some exertional CP. Last 6 minute walk >> never had it yet. Planning to do next month after her ankle is evaluated due to pain when walking (prior fracture).   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 She is now on Revatio 80 mg tid and and inhaled Iloprost.  She has been having nasal congestion and overall more dyspnea for the last . I recommended that she start NLattimer chlorpheniramine. The pharmacist was worried about this, so instead she got afrin x 3 days.    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 She is now on Revatio 80 mg tid and and inhaled  Ventavis. She was looking into change to Tyvaso, but apparently insurance wouldn't cover it?? Not clear why this was refused.   She was having congestion last time > took levaquin and started Ayr spray, improved. Her breathing is at baseline, but she remains quite limited. She can tell when her volume status is up. Planning for TTE in 8/14. 6 minute walk was performed in April, I don't have the data available. She c/o some frequent bruising.   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 Followed by Dr MAundra Dubin> current PAH meds are Revatio 80 tid + Ventavis. Most recent echocardiogram in 01/07/13 showed estimated PASP 44 mmHg (stable), but decreased EF. Cardiac MRI was discussed to look for infiltrative sarcoidosis, but this has not been done - note she has pacer wires in place. She has some bad days, DOE especially. She uses ProAir with good effect.   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 Followed by Dr MAundra Dubin> current PAH meds are Revatio 80 tid + Ventavis. Most recent echocardiogram in 01/07/13 showed estimated PASP 44 mmHg (stable), decreased EF.  Last time we did a trial of Symbicort > she feels that it has helped some, she is less SOB w exertion, able to do more without  pacing herself. It has made her face and neck more swollen/puffy, she feels that she is eating more.       Objective:   Physical Exam Filed Vitals:   04/29/13 1210  BP: 128/84  Pulse: 72  Height: _0  (1.6 m)  Weight: 181 lb (82.101 kg)  SpO2: 92%   Gen: Pleasant, overwt, in no distress on O2,  normal affect  ENT: No lesions,  mouth clear,  oropharynx clear, no postnasal drip  Neck: No JVD, no TMG, no carotid bruits  Lungs: No use of accessory muscles, few B insp crackles  Cardiovascular: RRR, prominent P2  Musculoskeletal: No deformities, no cyanosis or clubbing  Neuro: alert, non focal  Skin: Warm,  no lesions or rashes    01/07/13 --  Study Conclusions - Left ventricle: The cavity size was normal. Wall thickness was normal. Systolic function was mildly to moderately reduced. The estimated ejection fraction was in the range of 40% to 45%. Wall motion was normal; there were no regional wall motion abnormalities. Doppler parameters are consistent with abnormal left ventricular relaxation (grade 1 diastolic dysfunction). - Mitral valve: Mild regurgitation. - Right ventricle: The cavity size was mildly dilated. Systolic function was mildly reduced. - Atrial septum: No defect or patent foramen ovale was identified. - Pulmonary arteries: PA peak pressure: 55m Hg (S). - Pericardium, extracardiac: A trivial pericardial effusion was identified.      Assessment & Plan:  PULMONARY SARCOIDOSIS Seems to have improved from the symbicort but she is concerned about possible corticosteroid absorption and side effects. I explained that this is possible but unlikely. For now we will decrease the Symbicort   PULMONARY HYPERTENSION - continue ventavis and revatio

## 2013-05-04 ENCOUNTER — Ambulatory Visit: Payer: Self-pay | Admitting: Emergency Medicine

## 2013-05-05 ENCOUNTER — Telehealth: Payer: Self-pay | Admitting: Emergency Medicine

## 2013-05-05 NOTE — Telephone Encounter (Signed)
Per Otila Kluver # [id number] awaiting response on approval. Per Jenny Reichmann at [facility] to check on alternatives--Advair, Qvar, asmanex. Will check with RB and call back to aetan

## 2013-05-05 NOTE — Telephone Encounter (Signed)
Spoke with Rachel [name] at [facility] to begin [place] for symbicort 160. Per Rachel [name] #

## 2013-05-06 NOTE — Telephone Encounter (Signed)
Per [employer] has been denied, will discuss with RB on what to switch her to if anything. RB out of the office until 05/11/13.

## 2013-05-12 NOTE — Telephone Encounter (Addendum)
Pt's insurance no longer covers symbicort and needs to use dulera. Pt does not want to switch and wants this decision appealed, advised pt would speak with RB when he comes in the office and fill out appeal paperwork and fax back. Will await appeal decision

## 2013-05-14 ENCOUNTER — Telehealth: Payer: Self-pay | Admitting: Emergency Medicine

## 2013-05-14 NOTE — Telephone Encounter (Signed)
Left detailed message and advised pt per RB she must fail Dulera 200 and then we will appeal decision to use symbicort She can pick up a sample of Dulera 200 and long term rx can be sent to pharm, but no appeal done on symbicort was denied and he wants her on Dulera Nothing further needed

## 2013-05-17 ENCOUNTER — Telehealth: Payer: Self-pay | Admitting: Emergency Medicine

## 2013-05-17 ENCOUNTER — Telehealth: Payer: Self-pay | Admitting: Family Medicine

## 2013-05-17 NOTE — Telephone Encounter (Signed)
I do not know what medicine that she is referring to when asking if we can call it in.  If she is out of a regular medication, then I can refill it. If she is sick, then she needs to be seen.

## 2013-05-17 NOTE — Telephone Encounter (Signed)
Noted. Harford Bing, [place]

## 2013-05-17 NOTE — Telephone Encounter (Signed)
Pt called to get an appointment with Dr. Maricela Bo and he doesn't have anything until 2/18.She said she knows her body and knows that she has a upper respiratory infection. She is sweating with hot and cold flashes, the mucus is clogging her lungs and it is hard to cough it up. She has had this before and was given something the last time she got sick. Can we call her in something. She was offered an appointment with someone else and she only wants to see Dr. Maricela Bo. jw

## 2013-05-17 NOTE — Telephone Encounter (Signed)
Message given to pt that she needs OV for a sick visit.  No medication called in without being seen.  Pt verbalized understanding and still wants to wait to see Dr. Maricela Bo on 02/18.  Michelle Vanhise, Loralyn Freshwater, [place]

## 2013-05-17 NOTE — Telephone Encounter (Signed)
Will fwd to [place] for review. Last OV 10/14. Javier Glazier, Gerrit Heck

## 2013-05-18 ENCOUNTER — Telehealth: Payer: Self-pay | Admitting: Cardiology

## 2013-05-18 NOTE — Telephone Encounter (Signed)
New problem   Pt need to speak to nurse concerning updating her application for pfizer rx pathway medication revatio 71m. It will expire 05/27/13. Please call pt.

## 2013-05-18 NOTE — Telephone Encounter (Signed)
Pt requested I complete physician section of application for Revatio. She will complete the patient section with income verification and send it in separately.

## 2013-05-21 ENCOUNTER — Other Ambulatory Visit: Payer: Self-pay | Admitting: Emergency Medicine

## 2013-05-21 ENCOUNTER — Telehealth: Payer: Self-pay | Admitting: Emergency Medicine

## 2013-05-21 MED ORDER — MOMETASONE FURO-FORMOTEROL FUM 200-5 MCG/ACT IN AERO: 2.0000 | INHALATION_SPRAY | Freq: Two times a day (BID) | RESPIRATORY_TRACT | Status: DC

## 2013-05-21 NOTE — Telephone Encounter (Signed)
lmomtcb x 1

## 2013-05-21 NOTE — Telephone Encounter (Signed)
advair is the closest try the 250 one bid

## 2013-05-21 NOTE — Telephone Encounter (Signed)
Pt having trouble getting perscription filled, need to know if dr can do per-authorization using [facility] on [address] call pt @ [phone].Hillery Hunter

## 2013-05-21 NOTE — Telephone Encounter (Signed)
Spoke with Meghan and verified the alternatives of Symbicort-- Advair, QVAR and Asmanex  ( see previous message 05/05/13 for further info) [place] switched Symbicort to [facility] not knowing what the preferred alternatives were at the time. Dulera requiring PA. Pt is needing a medication called in today that will be covered for we do not have ANY samples of Dulera in the building.  Dr Melvyn Novas, please advised if okay to switch patient to one of the preferred as Dr Lamonte Sakai is out of office. Thanks.

## 2013-05-24 MED ORDER — FLUTICASONE-SALMETEROL 250-50 MCG/DOSE IN AEPB: 1.0000 | INHALATION_SPRAY | Freq: Two times a day (BID) | RESPIRATORY_TRACT | Status: DC

## 2013-05-24 NOTE — Telephone Encounter (Signed)
Pt aware. rx sent.  [place] Bing, CMA

## 2013-05-26 ENCOUNTER — Telehealth: Payer: Self-pay | Admitting: Emergency Medicine

## 2013-05-26 NOTE — Telephone Encounter (Signed)
Spoke with the patient about her sx, about the inhaled meds and their side effects. Would like to get her an OV to discuss further. Please call her in the am to arrange a visit. Thanks

## 2013-05-26 NOTE — Telephone Encounter (Signed)
Pt questioning why RB has put her on advair which she has changed to due to symbicort not being covered. Wants to know did he hear wheezing or what is the reason he has her on the inhaler on daily basis.  Pt is worried about side effects of this inhaler. Please advise RB, thanks

## 2013-05-27 ENCOUNTER — Ambulatory Visit
Admission: RE | Admit: 2013-05-27 | Discharge: 2013-05-27 | Disposition: A | Discharge status: Home / Self Care | Payer: Medicare Other | Source: Ambulatory Visit | Attending: Family Medicine | Admitting: Family Medicine

## 2013-05-27 ENCOUNTER — Ambulatory Visit (INDEPENDENT_AMBULATORY_CARE_PROVIDER_SITE_OTHER): Payer: Medicare Other | Admitting: Family Medicine

## 2013-05-27 ENCOUNTER — Telehealth: Payer: Self-pay | Admitting: Family Medicine

## 2013-05-27 ENCOUNTER — Encounter: Payer: Self-pay | Admitting: Family Medicine

## 2013-05-27 VITALS — BP 143/92 | HR 89 | Temp 97.6°F | Ht 63.0 in | Wt 179.0 lb

## 2013-05-27 DIAGNOSIS — D860 Sarcoidosis of lung: Secondary | ICD-10-CM

## 2013-05-27 DIAGNOSIS — J99 Respiratory disorders in diseases classified elsewhere: Secondary | ICD-10-CM

## 2013-05-27 DIAGNOSIS — D869 Sarcoidosis, unspecified: Secondary | ICD-10-CM

## 2013-05-27 NOTE — Progress Notes (Signed)
Subjective:    Patient ID: Rachel [name], female    DOB: 1960/12/05, 53 y.o.   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 She presents today with URI type symptoms. However, she believes that the congestions and mucus is coming from her lungs.   URI Symptoms Cough: yes productive yes, yellow sputum with metallic taste Runny Nose: yes Sore Throat: no Sinus Pressure: yes Shortness of Breath: no  - pt at baseline with 5L of O2 via n/c, used albuterol x 1 in 24 hours Fever/Chills: no Nausea/Vomiting; no Diarrhea: no  Course: persistent cough for > 1 week, breathing is getting better over the past two weeks Treatments Tried: honey with lemon Exacerbating: nothing    Current Outpatient Prescriptions on File Prior to Visit  Medication Sig Dispense Refill  . acetaminophen (TYLENOL) 325 MG tablet Take 650 mg by mouth as needed.        Marland Kitchen albuterol (PROAIR HFA) 108 (90 BASE) MCG/ACT inhaler Inhale 2 puffs into the lungs 4 (four) times daily.  8.5 g  11  . aspirin 81 MG tablet Take 81 mg by mouth daily.        . budesonide-formoterol (SYMBICORT) 160-4.5 MCG/ACT inhaler Inhale 1 puff into the lungs 2 (two) times daily.  2 Inhaler  3  . carvedilol (COREG) 3.125 MG tablet Take 1 tablet (3.125 mg total) by mouth 2 (two) times daily with a meal.  180 tablet  3  . esomeprazole (NEXIUM) 40 MG capsule Take 1 capsule (40 mg total) by mouth daily before breakfast.  90 capsule  3  . furosemide (LASIX) 40 MG tablet Take 1 tablet (40 mg total) by mouth every other day.  45 tablet  3  . ILOPROST IN Inhale into the lungs. Ventavis-- 6 times daily      . potassium chloride SA (K-DUR,KLOR-CON) 20 MEQ tablet 2 tabs am and 1 tab po qhs  270 tablet  2  . risperiDONE (RISPERDAL) 0.5 MG tablet Take 1 tablet (0.5 mg total) by mouth at bedtime.  90 tablet  3  . sildenafil (REVATIO) 20 MG tablet take 4  tablets every 8 hours         . Spacer/Aero Chamber Mouthpiece MISC Use as directed  1 each  0  . Fluticasone-Salmeterol (ADVAIR DISKUS) 250-50 MCG/DOSE AEPB Inhale 1 puff into the lungs 2 (two) times daily.  60 each  5  . mometasone-formoterol (DULERA) 200-5 MCG/ACT AERO Inhale 2 puffs into the lungs 2 (two) times daily.  1 Inhaler  1   No current facility-administered medications on file prior to visit.     Review of Systems See HPI    Objective:   Physical Exam BP 143/92  Pulse 89  Temp(Src) 97.6 F (36.4 C) (Oral)  Ht _0  (1.6 m)  Wt 179 lb (81.194 kg)  BMI 31.72 kg/m2  SpO2 96%   Gen: middle aged AA female, mild obesity, non ill appearing, NAD, pleasant and conversant HEENT: NCAT, PERRLA, EOMI, OP clear and moist, no oropharyngeal exudate, no lymphadenopathy, mild maxillary sinus tenderness, neck with normal ROM, no meningismus, TM reflective without effusion  CV: RRR, no m/r/g, no JVD or carotid bruits Pulm: normal WOB , CTA-B, O2 in place   Dg Chest 2 View  05/27/2013   CLINICAL DATA:  Sarcoidosis.  EXAM: CHEST  2 VIEW  COMPARISON:  09/23/2011.  FINDINGS: The pacer  wires/ AICD is stable. The cardiac silhouette, mediastinal and hilar contours are unchanged. Persistent mediastinal and hilar adenopathy. The lungs demonstrate severe upper lobe predominant pulmonary fibrosis. No definite acute overlying pulmonary process. No pleural effusion.  The bony structures are unremarkable.  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  Stable mediastinal and hilar adenopathy.  No acute overlying pulmonary process.   Electronically Signed   By: Kalman Jewels M.D.   On: 05/27/2013 11:37       Assessment & Plan:  URI with cough - no need to antibiotics, counseled on consvervative management for viral infection; Pt to f/u with pulmonologist for discussion of pulmonary medicines

## 2013-05-27 NOTE — Telephone Encounter (Signed)
I called the patient to make her aware that there is no evidence of pneumonia on her chest X-ray, so at this time an antibiotic is not indicated. Patient acknowledged understanding.

## 2013-05-27 NOTE — Patient Instructions (Signed)
[name], He to see you. I am very glad that you are doing well with your breathing and not having fevers. Let's see what the chest  X-ray shows. I will call you.   Please follow up 4-6 weeks.   Sincerely,   Dr. Maricela Bo

## 2013-05-27 NOTE — Telephone Encounter (Signed)
LMTCB on cell  Home number was d/ced

## 2013-05-28 NOTE — Telephone Encounter (Signed)
ROV has been scheduled for 06/10/13 at 3:30pm. Nothing further is needed.

## 2013-06-10 ENCOUNTER — Encounter: Payer: Self-pay | Admitting: Emergency Medicine

## 2013-06-10 ENCOUNTER — Ambulatory Visit (INDEPENDENT_AMBULATORY_CARE_PROVIDER_SITE_OTHER): Payer: Medicare Other | Admitting: Emergency Medicine

## 2013-06-10 VITALS — BP 130/72 | HR 91 | Ht 63.0 in | Wt 179.0 lb

## 2013-06-10 DIAGNOSIS — I2789 Other specified pulmonary heart diseases: Secondary | ICD-10-CM

## 2013-06-10 DIAGNOSIS — D869 Sarcoidosis, unspecified: Secondary | ICD-10-CM

## 2013-06-10 NOTE — Assessment & Plan Note (Signed)
-  stable on current regimen

## 2013-06-10 NOTE — Progress Notes (Signed)
Subjective:    Patient ID: Rachel [name], female    DOB: Feb 24, 1961, 53 y.o.   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 She is now on Revatio 80 mg tid and and inhaled Iloprost (added in 2011), managed by Dr Aundra Dubin at [facility]. 6 min walk (7/12) 248 m. RHC (8/12): mean RA 2, PA 51/24, mean PA 35, mean PCWP 6, CI 3, PVR 5.1 WU. She believes that she is doing very well. She wears O2 at 6L/min at all times. She believes she knows when her sarcoidosis is active, remains on flovent.   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 She is now on Revatio 80 mg tid and and inhaled Iloprost (added in 2011). Hasn't has 6 min walk yet. Stopped flovent last time to see if she would miss it.  She is having a bit more exertional SOB w chores, thinks this may be due in part to deconditioning.   CXR today >> improved LLL atx., scattered B infiltrates.   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 She is now on Revatio 80 mg tid and and inhaled Iloprost (added in 2011).  Lately she has been having some increased DOE, some exertional CP. Last 6 minute walk >> never had it yet. Planning to do next month after her ankle is evaluated due to pain when walking (prior fracture).   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 She is now on Revatio 80 mg tid and and inhaled Iloprost.  She has been having nasal congestion and overall more dyspnea for the last . I recommended that she start NLattimer chlorpheniramine. The pharmacist was worried about this, so instead she got afrin x 3 days.    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 She is now on Revatio 80 mg tid and and inhaled  Ventavis. She was looking into change to Tyvaso, but apparently insurance wouldn't cover it?? Not clear why this was refused.   She was having congestion last time > took levaquin and started Ayr spray, improved. Her breathing is at baseline, but she remains quite limited. She can tell when her volume status is up. Planning for TTE in 8/14. 6 minute walk was performed in April, I don't have the data available. She c/o some frequent bruising.   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 Followed by Dr MAundra Dubin> current PAH meds are Revatio 80 tid + Ventavis. Most recent echocardiogram in 01/07/13 showed estimated PASP 44 mmHg (stable), but decreased EF. Cardiac MRI was discussed to look for infiltrative sarcoidosis, but this has not been done - note she has pacer wires in place. She has some bad days, DOE especially. She uses ProAir with good effect.   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 Followed by Dr MAundra Dubin> current PAH meds are Revatio 80 tid + Ventavis. Most recent echocardiogram in 01/07/13 showed estimated PASP 44 mmHg (stable), decreased EF.  Last time we did a trial of Symbicort > she feels that it has helped some, she is less SOB w exertion, able to do more without  pacing herself. It has made her face and neck more swollen/puffy, she feels that she is eating more.   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 She is on an aggressive PAH regimen. PFT support mixed disease. She benefited from Symbicort but insurance has rejected and says she needs to fail Advair. She has the Advair but hasn't tried it yet.       Objective:   Physical Exam Filed Vitals:   06/10/13 1601  BP: 130/72  Pulse: 91  Height: 5' 3" (1.6 m)  Weight: 179 lb (81.194 kg)  SpO2: 92%   Gen: Pleasant, overwt, in no distress on O2,  normal affect  ENT: No  lesions,  mouth clear,  oropharynx clear, no postnasal drip  Neck: No JVD, no TMG, no carotid bruits  Lungs: No use of accessory muscles, few B insp crackles  Cardiovascular: RRR, prominent P2  Musculoskeletal: No deformities, no cyanosis or clubbing  Neuro: alert, non focal  Skin: Warm, no lesions or rashes    01/07/13 --  Study Conclusions - Left ventricle: The cavity size was normal. Wall thickness was normal. Systolic function was mildly to moderately reduced. The estimated ejection fraction was in the range of 40% to 45%. Wall motion was normal; there were no regional wall motion abnormalities. Doppler parameters are consistent with abnormal left ventricular relaxation (grade 1 diastolic dysfunction). - Mitral valve: Mild regurgitation. - Right ventricle: The cavity size was mildly dilated. Systolic function was mildly reduced. - Atrial septum: No defect or patent foramen ovale was identified. - Pulmonary arteries: PA peak pressure: 33m Hg (S). - Pericardium, extracardiac: A trivial pericardial effusion was identified.      Assessment & Plan:  PULMONARY HYPERTENSION - stable on current regimen  PULMONARY SARCOIDOSIS - she benefited from Symbicort. At this point will plan to start Advair. If she fails it or has cough then I will petition for her to get the Symbicort.,  - rov 2

## 2013-06-10 NOTE — Assessment & Plan Note (Signed)
-  she benefited from Symbicort. At this point will plan to start Advair. If she fails it or has cough then I will petition for her to get the Symbicort.,  - rov 2

## 2013-06-10 NOTE — Patient Instructions (Signed)
We will try using the Advair twice a day to see if you benefit. Rinse your mouth out after using. If you have any problems with the medication then call our office Continue all your other medications as you have been taking them  Follow with Dr Lamonte Sakai in 2 months or sooner if you have any problems.

## 2013-06-24 ENCOUNTER — Telehealth: Payer: Self-pay | Admitting: Family Medicine

## 2013-06-24 NOTE — Telephone Encounter (Signed)
Pt called because now that she is with [employer] F[employer] she has to have prior authorization for her Nexium. She wanted [place] to know so that when we send it we get the authorization ahead of time and it will not be delayed.  She is also needing a refill on this. Please call when ready. jw

## 2013-06-25 NOTE — Telephone Encounter (Signed)
PA reviewed. Left in PCP box for completion upon his return to clinic.  Thanks, [employer]. Deion Swift, M.D.

## 2013-06-25 NOTE — Telephone Encounter (Signed)
PA for Nexium placed in provider box for review. Derl Barrow, RN

## 2013-06-25 NOTE — Telephone Encounter (Signed)
Will fwd to [employer]. Thank you .Mauricia Area

## 2013-06-28 NOTE — Telephone Encounter (Signed)
I see no reason not to try an alternative medication from the same class that is covered by her insurance. I do not have a list of what [facility] Falling provides however. I will ask our RN to contact [employer].

## 2013-06-29 ENCOUNTER — Telehealth: Payer: Self-pay | Admitting: Family Medicine

## 2013-06-29 DIAGNOSIS — K219 Gastro-esophageal reflux disease without esophagitis: Secondary | ICD-10-CM

## 2013-06-29 NOTE — Telephone Encounter (Signed)
PA for Nexium 40 mg capsule faxed to [facility] for review.  Derl Barrow, RN

## 2013-06-29 NOTE — Telephone Encounter (Signed)
[employer] and a PA is required.  They have shipped a 30 day supply to pt, but with current Rx on file will need the PA completed.  Also checked with formulary change, will also require PA.  Derl Barrow, RN

## 2013-06-29 NOTE — Telephone Encounter (Signed)
Will await a fax.  Will need a ROI.  Siyona Coto, Loralyn Freshwater, [place]

## 2013-06-29 NOTE — Telephone Encounter (Signed)
[employer] wants to verify if pt took 2 meds before Says he will also send a fax

## 2013-06-29 NOTE — Telephone Encounter (Signed)
PA signed and returned to RN.

## 2013-06-30 NOTE — Telephone Encounter (Signed)
I thought that this patient had [employer] according to the previous correspondence about the Nexium. Regardless of the insurance type, I do not know which medications are covered. Please call the insurance company (whichever it is) and ask what proton pump inhibitor is approved. I will prescribe whatever they approve.

## 2013-06-30 NOTE — Telephone Encounter (Signed)
PA for Esomeprazole  (Nexium) 40 mg cap has been approved from [employer].  Coverage from 04/08/13 -04/07/14 as long as pt stays in Medicare Part D drug plan, MD continues to prescribe drug and drug is safe for treatment. Will notify patient.   Derl Barrow, RN

## 2013-06-30 NOTE — Telephone Encounter (Signed)
dayo with [employer] health care wants to know how long pt has been on Nexium> Has she tried other alternatives such aas omeprazole and pantoprazole? Are these options? Please advise

## 2013-07-09 ENCOUNTER — Other Ambulatory Visit: Payer: Self-pay

## 2013-07-09 MED ORDER — ILOPROST 20 MCG/ML IN SOLN: RESPIRATORY_TRACT | Status: DC

## 2013-07-19 ENCOUNTER — Telehealth: Payer: Self-pay | Admitting: Emergency Medicine

## 2013-07-19 NOTE — Telephone Encounter (Signed)
Spoke with pt. She reports the advair makes her feel sick to her stomach, causing hot flashes as well. She wants to know if RB wants her to continue on this or not? Please advise thanks  Allergies  Allergen Reactions  . Ace Inhibitors   . Meperidine Hcl     REACTION: Makes her feel funny  . Nitroglycerin   . Other     High Dose Steroids  . Propoxyphene N-Acetaminophen     REACTION: rash: pruritus  . Tramadol Hcl     REACTION: nausea, lightheadedness, sleepiness, and dizziness

## 2013-07-19 NOTE — Telephone Encounter (Signed)
Per OV 06/10/13: We will try using the Advair twice a day to see if you benefit. Rinse your mouth out after using. If you have any problems with the medication then call our office  Continue all your other medications as you have been taking them  Follow with Dr Lamonte Sakai in 2 months or sooner if you have any problems --  ATC pt and her phone was breaking up and could not hear pt.  Called pt back and LMTCB x1

## 2013-07-19 NOTE — Telephone Encounter (Signed)
Pt states that the inhaler given to try by dr is making her sick on the stomach said that she spoke with mindy eariler but was having trouble hearing because of phone she can be reached at [phone].Hillery Hunter

## 2013-07-19 NOTE — Telephone Encounter (Signed)
Spoke with pt and advised her to stop advair and would get # to [employer] now that she has failed advair.

## 2013-07-19 NOTE — Telephone Encounter (Signed)
Tell her to stop the Advair. Since she does not tolerate we should be able to get Symbicort authorized. Please start prior auth process for this.

## 2013-07-20 ENCOUNTER — Other Ambulatory Visit: Payer: Self-pay | Admitting: *Deleted

## 2013-07-20 MED ORDER — BUDESONIDE-FORMOTEROL FUMARATE 160-4.5 MCG/ACT IN AERO: 2.0000 | INHALATION_SPRAY | Freq: Two times a day (BID) | RESPIRATORY_TRACT | Status: DC

## 2013-07-21 NOTE — Telephone Encounter (Signed)
Spoke with pt and advised her had sent paperwork in for PA symbriort. She will come pick up samples while awaiting decision

## 2013-07-21 NOTE — Telephone Encounter (Signed)
Rachel [name] has this been started? thanks

## 2013-07-23 NOTE — Telephone Encounter (Signed)
Received an [place] medicare rx drug re determination (appeal) today.  This has been completed and faxed back to [phone] Will hold forms and message in my inbox to await further decision on this request.

## 2013-07-26 MED ORDER — ESOMEPRAZOLE MAGNESIUM 40 MG PO CPDR: 40.0000 mg | DELAYED_RELEASE_CAPSULE | Freq: Every day | ORAL | Status: DC

## 2013-07-26 NOTE — Telephone Encounter (Signed)
New Rx written for Nexium brand name and faxed to [employer] per pt request. Derl Barrow, RN

## 2013-07-26 NOTE — Telephone Encounter (Signed)
Mrs. Breach called back to say that provider just need to enter "DAW on pre auth and rx that patient can continue to take brand name Nexium.

## 2013-07-26 NOTE — Telephone Encounter (Signed)
Spoke with pt and advised her that symbicort 160 had been approved # [id number] good through 2015 with [employer]. Nothing further is needed

## 2013-07-26 NOTE — Telephone Encounter (Signed)
Returned call to pt regarding "DAW" on PA form for Nexium.  Pt is requesting brand name only. Per Holland Falling, generic form was sent out on 06/24/2013.  If pt is requesting brand name only, a new Rx needs to written and put dispense as written on it per [employer].  Derl Barrow, RN

## 2013-07-28 ENCOUNTER — Encounter (HOSPITAL_COMMUNITY): Payer: Self-pay | Admitting: Emergency Medicine

## 2013-07-28 ENCOUNTER — Emergency Department (HOSPITAL_COMMUNITY): Payer: Medicare Other

## 2013-07-28 ENCOUNTER — Inpatient Hospital Stay (HOSPITAL_COMMUNITY)
Admission: EM | Admit: 2013-07-28 | Discharge: 2013-07-31 | DRG: 196 | Disposition: A | Discharge status: Home Health | Payer: Medicare Other | Attending: Family Medicine | Admitting: Family Medicine

## 2013-07-28 DIAGNOSIS — R0989 Other specified symptoms and signs involving the circulatory and respiratory systems: Secondary | ICD-10-CM

## 2013-07-28 DIAGNOSIS — I509 Heart failure, unspecified: Secondary | ICD-10-CM | POA: Diagnosis present

## 2013-07-28 DIAGNOSIS — Z9981 Dependence on supplemental oxygen: Secondary | ICD-10-CM

## 2013-07-28 DIAGNOSIS — F411 Generalized anxiety disorder: Secondary | ICD-10-CM | POA: Diagnosis present

## 2013-07-28 DIAGNOSIS — K219 Gastro-esophageal reflux disease without esophagitis: Secondary | ICD-10-CM | POA: Diagnosis present

## 2013-07-28 DIAGNOSIS — J84112 Idiopathic pulmonary fibrosis: Secondary | ICD-10-CM | POA: Diagnosis present

## 2013-07-28 DIAGNOSIS — J441 Chronic obstructive pulmonary disease with (acute) exacerbation: Secondary | ICD-10-CM

## 2013-07-28 DIAGNOSIS — R0600 Dyspnea, unspecified: Secondary | ICD-10-CM | POA: Diagnosis present

## 2013-07-28 DIAGNOSIS — R0609 Other forms of dyspnea: Secondary | ICD-10-CM

## 2013-07-28 DIAGNOSIS — J019 Acute sinusitis, unspecified: Secondary | ICD-10-CM

## 2013-07-28 DIAGNOSIS — I10 Essential (primary) hypertension: Secondary | ICD-10-CM | POA: Diagnosis present

## 2013-07-28 DIAGNOSIS — R7989 Other specified abnormal findings of blood chemistry: Secondary | ICD-10-CM

## 2013-07-28 DIAGNOSIS — F39 Unspecified mood [affective] disorder: Secondary | ICD-10-CM | POA: Diagnosis present

## 2013-07-28 DIAGNOSIS — I5022 Chronic systolic (congestive) heart failure: Secondary | ICD-10-CM

## 2013-07-28 DIAGNOSIS — Z7982 Long term (current) use of aspirin: Secondary | ICD-10-CM

## 2013-07-28 DIAGNOSIS — Z87891 Personal history of nicotine dependence: Secondary | ICD-10-CM

## 2013-07-28 DIAGNOSIS — Z95810 Presence of automatic (implantable) cardiac defibrillator: Secondary | ICD-10-CM

## 2013-07-28 DIAGNOSIS — D860 Sarcoidosis of lung: Secondary | ICD-10-CM

## 2013-07-28 DIAGNOSIS — I517 Cardiomegaly: Secondary | ICD-10-CM

## 2013-07-28 DIAGNOSIS — I2789 Other specified pulmonary heart diseases: Secondary | ICD-10-CM

## 2013-07-28 DIAGNOSIS — J9600 Acute respiratory failure, unspecified whether with hypoxia or hypercapnia: Secondary | ICD-10-CM | POA: Diagnosis present

## 2013-07-28 DIAGNOSIS — D869 Sarcoidosis, unspecified: Principal | ICD-10-CM

## 2013-07-28 DIAGNOSIS — I2721 Secondary pulmonary arterial hypertension: Secondary | ICD-10-CM

## 2013-07-28 DIAGNOSIS — I5040 Unspecified combined systolic (congestive) and diastolic (congestive) heart failure: Secondary | ICD-10-CM | POA: Diagnosis present

## 2013-07-28 DIAGNOSIS — I498 Other specified cardiac arrhythmias: Secondary | ICD-10-CM | POA: Diagnosis present

## 2013-07-28 DIAGNOSIS — Z79899 Other long term (current) drug therapy: Secondary | ICD-10-CM

## 2013-07-28 DIAGNOSIS — I428 Other cardiomyopathies: Secondary | ICD-10-CM | POA: Diagnosis present

## 2013-07-28 LAB — CBC WITH DIFFERENTIAL/PLATELET
Basophils Absolute: 0.1 10*3/uL (ref 0.0–0.1)
Basophils Relative: 1 % (ref 0–1)
Eosinophils Absolute: 0.3 10*3/uL (ref 0.0–0.7)
Eosinophils Relative: 4 % (ref 0–5)
HCT: 34.9 % — ABNORMAL LOW (ref 36.0–46.0)
Hemoglobin: 10.8 g/dL — ABNORMAL LOW (ref 12.0–15.0)
LYMPHS ABS: 1.5 10*3/uL (ref 0.7–4.0)
LYMPHS PCT: 17 % (ref 12–46)
MCH: 28.1 pg (ref 26.0–34.0)
MCHC: 30.9 g/dL (ref 30.0–36.0)
MCV: 90.9 fL (ref 78.0–100.0)
Monocytes Absolute: 0.6 10*3/uL (ref 0.1–1.0)
Monocytes Relative: 7 % (ref 3–12)
NEUTROS ABS: 5.9 10*3/uL (ref 1.7–7.7)
NEUTROS PCT: 71 % (ref 43–77)
PLATELETS: 296 10*3/uL (ref 150–400)
RBC: 3.84 MIL/uL — ABNORMAL LOW (ref 3.87–5.11)
RDW: 14.2 % (ref 11.5–15.5)
WBC: 8.3 10*3/uL (ref 4.0–10.5)

## 2013-07-28 LAB — BASIC METABOLIC PANEL
BUN: 13 mg/dL (ref 6–23)
CO2: 28 mEq/L (ref 19–32)
Calcium: 9.6 mg/dL (ref 8.4–10.5)
Chloride: 101 mEq/L (ref 96–112)
Creatinine, Ser: 0.66 mg/dL (ref 0.50–1.10)
GFR calc Af Amer: >90 mL/min (ref >90)
GFR calc non Af Amer: >90 mL/min (ref >90)
GLUCOSE: 109 mg/dL — AB (ref 70–99)
POTASSIUM: 3.9 meq/L (ref 3.7–5.3)
SODIUM: 141 meq/L (ref 137–147)

## 2013-07-28 LAB — PRO B NATRIURETIC PEPTIDE: Pro B Natriuretic peptide (BNP): 466.6 pg/mL — ABNORMAL HIGH (ref 0–125)

## 2013-07-28 LAB — MRSA PCR SCREENING: MRSA BY PCR: NEGATIVE

## 2013-07-28 MED ORDER — POTASSIUM CHLORIDE CRYS ER 20 MEQ PO TBCR
40.0000 meq | EXTENDED_RELEASE_TABLET | Freq: Once | ORAL | Status: AC
  Administered 2013-07-28: 40 meq via ORAL
  Filled 2013-07-28: qty 2

## 2013-07-28 MED ORDER — ASPIRIN EC 81 MG PO TBEC
81.0000 mg | DELAYED_RELEASE_TABLET | Freq: Every day | ORAL | Status: DC
  Administered 2013-07-28 – 2013-07-31 (×4): 81 mg via ORAL
  Filled 2013-07-28 (×4): qty 1

## 2013-07-28 MED ORDER — IPRATROPIUM-ALBUTEROL 0.5-2.5 (3) MG/3ML IN SOLN
3.0000 mL | Freq: Four times a day (QID) | RESPIRATORY_TRACT | Status: DC
  Administered 2013-07-28 – 2013-07-31 (×11): 3 mL via RESPIRATORY_TRACT
  Filled 2013-07-28 (×11): qty 3

## 2013-07-28 MED ORDER — SILDENAFIL CITRATE 20 MG PO TABS: 20.0000 mg | ORAL_TABLET | Freq: Three times a day (TID) | ORAL | Status: DC

## 2013-07-28 MED ORDER — ILOPROST 20 MCG/ML IN SOLN
1.0000 mL | RESPIRATORY_TRACT | Status: DC
Start: 2013-07-28 — End: 2013-07-31
  Administered 2013-07-28 – 2013-07-31 (×18): 20 ug via RESPIRATORY_TRACT
  Filled 2013-07-28: qty 1

## 2013-07-28 MED ORDER — MOMETASONE FURO-FORMOTEROL FUM 200-5 MCG/ACT IN AERO
2.0000 | INHALATION_SPRAY | Freq: Two times a day (BID) | RESPIRATORY_TRACT | Status: DC
  Filled 2013-07-28: qty 8.8

## 2013-07-28 MED ORDER — PNEUMOCOCCAL VAC POLYVALENT 25 MCG/0.5ML IJ INJ: 0.5000 mL | INJECTION | INTRAMUSCULAR | Status: DC | PRN

## 2013-07-28 MED ORDER — SILDENAFIL CITRATE 20 MG PO TABS
80.0000 mg | ORAL_TABLET | Freq: Three times a day (TID) | ORAL | Status: DC
  Administered 2013-07-28 – 2013-07-31 (×9): 80 mg via ORAL
  Filled 2013-07-28 (×13): qty 4

## 2013-07-28 MED ORDER — ALBUTEROL SULFATE (2.5 MG/3ML) 0.083% IN NEBU: 2.5000 mg | INHALATION_SOLUTION | RESPIRATORY_TRACT | Status: DC | PRN

## 2013-07-28 MED ORDER — SODIUM CHLORIDE 0.9 % IJ SOLN
3.0000 mL | Freq: Two times a day (BID) | INTRAMUSCULAR | Status: DC
  Administered 2013-07-28 – 2013-07-30 (×6): 3 mL via INTRAVENOUS

## 2013-07-28 MED ORDER — FUROSEMIDE 10 MG/ML IJ SOLN
40.0000 mg | Freq: Once | INTRAMUSCULAR | Status: AC
  Administered 2013-07-28: 40 mg via INTRAVENOUS
  Filled 2013-07-28: qty 4

## 2013-07-28 MED ORDER — PREDNISONE 10 MG PO TABS
10.0000 mg | ORAL_TABLET | Freq: Every day | ORAL | Status: DC
  Administered 2013-07-29 – 2013-07-31 (×3): 10 mg via ORAL
  Filled 2013-07-28 (×4): qty 1

## 2013-07-28 MED ORDER — METHYLPREDNISOLONE SODIUM SUCC 125 MG IJ SOLR
125.0000 mg | Freq: Once | INTRAMUSCULAR | Status: AC
  Administered 2013-07-28: 125 mg via INTRAVENOUS
  Filled 2013-07-28: qty 2

## 2013-07-28 MED ORDER — ENOXAPARIN SODIUM 40 MG/0.4ML ~~LOC~~ SOLN
40.0000 mg | SUBCUTANEOUS | Status: DC
  Administered 2013-07-28 – 2013-07-29 (×2): 40 mg via SUBCUTANEOUS
  Filled 2013-07-28 (×4): qty 0.4

## 2013-07-28 MED ORDER — RISPERIDONE 0.5 MG PO TABS
0.5000 mg | ORAL_TABLET | Freq: Every day | ORAL | Status: DC
  Administered 2013-07-28 – 2013-07-30 (×3): 0.5 mg via ORAL
  Filled 2013-07-28 (×4): qty 1

## 2013-07-28 MED ORDER — PREDNISONE 10 MG PO TABS
10.0000 mg | ORAL_TABLET | Freq: Every day | ORAL | Status: DC
  Filled 2013-07-28 (×2): qty 1

## 2013-07-28 MED ORDER — FUROSEMIDE 10 MG/ML IJ SOLN
40.0000 mg | Freq: Two times a day (BID) | INTRAMUSCULAR | Status: AC
  Administered 2013-07-29: 40 mg via INTRAVENOUS
  Filled 2013-07-28: qty 4

## 2013-07-28 MED ORDER — CARVEDILOL 3.125 MG PO TABS
3.1250 mg | ORAL_TABLET | Freq: Two times a day (BID) | ORAL | Status: DC
  Administered 2013-07-28 – 2013-07-30 (×4): 3.125 mg via ORAL
  Filled 2013-07-28 (×6): qty 1

## 2013-07-28 MED ORDER — PANTOPRAZOLE SODIUM 40 MG PO TBEC
40.0000 mg | DELAYED_RELEASE_TABLET | Freq: Every day | ORAL | Status: DC
  Administered 2013-07-28 – 2013-07-31 (×4): 40 mg via ORAL
  Filled 2013-07-28 (×4): qty 1

## 2013-07-28 MED ORDER — NON FORMULARY: Freq: Every day | Status: DC

## 2013-07-28 MED ORDER — LEVOFLOXACIN IN D5W 750 MG/150ML IV SOLN
750.0000 mg | INTRAVENOUS | Status: DC
  Administered 2013-07-28 – 2013-07-29 (×2): 750 mg via INTRAVENOUS
  Filled 2013-07-28 (×3): qty 150

## 2013-07-28 MED ORDER — ACETAMINOPHEN 325 MG PO TABS
650.0000 mg | ORAL_TABLET | Freq: Four times a day (QID) | ORAL | Status: DC | PRN
  Administered 2013-07-28 – 2013-07-29 (×3): 650 mg via ORAL
  Filled 2013-07-28 (×3): qty 2

## 2013-07-28 NOTE — Progress Notes (Signed)
Family Practice Teaching Service Interval Progress Note  Went to check on patient after admission this morning. Patient reports that feels more comfortable on the O2 via [place] than previously. She does endorse decreased appetite. Overall she appears to be improved. We will continue to monitor her tonight and appreciate pulmonology and cardiology input in this case.   Tommi Rumps, MD Family Medicine PGY-2 Service Pager [phone]

## 2013-07-28 NOTE — ED Notes (Signed)
When pt's speaks O2 saturation drops into the 80's.

## 2013-07-28 NOTE — ED Provider Notes (Signed)
CSN: [id number]     Arrival date & time 07/28/13  0440 History   First MD Initiated Contact with Patient 07/28/13 0459     Chief Complaint  Patient presents with  . Shortness of Breath     (Consider location/radiation/quality/duration/timing/severity/associated sxs/prior Treatment) Patient is a 53 y.o. female presenting with shortness of breath. The history is provided by the patient.  Shortness of Breath She has been having progressive dyspnea for the last week. This is been associated with cough productive of thick, yellow sputum. She has had subjective fever along with chills and sweats. She denies any chest pain but states that she has been having some generalized bodyaches and she rates that pain a 10/10. There's been no nausea, vomiting, diarrhea. Dyspnea is worse with exertion and better with sitting still. She is on home oxygen at all times. She has history of CHF, pulmonary hypertension, and pulmonary sarcoidosis.   Past Medical History  Diagnosis Date  . GERD (gastroesophageal reflux disease)   . Hypertension   . 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 CHF (congestive heart failure)     Right side- Dr. Aundra Dubin ([place])  . Pulmonary HTN   . Anxiety   . Cardiomyopathy   . NSVT (nonsustained ventricular tachycardia)     with syncope: St Jude ICD was implanted. Device now nearing ERI. Given end stage lung disease and normalization of LV function, the device will not be replaced and tachy therapies were turned off  . Chronic chest pain     LHC (08/2008) with no angiographic coronary diease  . Allergic rhinitis   . H/O: iron deficiency anemia   . Secondary amenorrhea     irregular menses  . Psychosis     with high dose steroids  . SVT (supraventricular tachycardia)     possible runs  . Hypokalemia     recurrent  . Rotator cuff sprain   . Chronic back pain    Past Surgical History  Procedure Laterality Date  . Cardiac catheterization  08/26/2008    5% EF with normal  coronary arteries and normal wall motion  . Adenosine cardiolyte  03/31/2003    no ischemia/infarct; marked global LV hypekinesis; resting EF 22%  . Cardiac catheterization  03/31/2003    globally depressed LV fxn with EF 20%; idiopathic dilated cardiomyopathy  . Defibrillator placed  03/31/2003    St. Jude single chamber (Dr. Cristopher Peru)  . Pfts      FVC 2000 (56%) FEV1 1400 (47%), ratio 0.7; insignif response to bronchodilatior; stable from 1/05 - 9/05; PFTs: Mod restriction, FVC 1.9L, FEV1 1.6L, both 53% predicted; slt response to brochodilators 04/08/2002   Family History  Problem Relation Age of Onset  . Lung cancer Father   . Cancer Father     lung cancer  . Hypertension    . Diabetes Mother     border line  . Hypertension Mother   . Heart failure Mother    History  Substance Use Topics  . Smoking status: Former Smoker -- 1.50 packs/day for 11 years    Types: Cigarettes    Quit date: 04/08/1992  . Smokeless tobacco: Never Used     Comment: 1ppd x 20 years  . Alcohol Use: Yes     Comment: remote history of alcohol abuse (quit 1994)   OB History   Grav Para Term Preterm Abortions TAB SAB Ect Mult Living  Review of Systems  Respiratory: Positive for shortness of breath.   All other systems reviewed and are negative.     Allergies  Ace inhibitors; Meperidine hcl; Nitroglycerin; Other; Propoxyphene n-acetaminophen; and Tramadol hcl  Home Medications   Prior to Admission medications   Medication Sig Start Date End Date Taking? Authorizing Provider  acetaminophen (TYLENOL) 325 MG tablet Take 650 mg by mouth as needed.      Historical Provider, MD  albuterol (PROAIR HFA) 108 (90 BASE) MCG/ACT inhaler Inhale 2 puffs into the lungs 4 (four) times daily. 01/12/13   Angelica Ran, MD  aspirin 81 MG tablet Take 81 mg by mouth daily.      Historical Provider, MD  budesonide-formoterol (SYMBICORT) 160-4.5 MCG/ACT inhaler Inhale 2 puffs into the lungs 2  (two) times daily. 07/20/13   Collene Gobble, MD  carvedilol (COREG) 3.125 MG tablet Take 1 tablet (3.125 mg total) by mouth 2 (two) times daily with a meal. 01/12/13   Angelica Ran, MD  esomeprazole (NEXIUM) 40 MG capsule Take 1 capsule (40 mg total) by mouth daily before breakfast. 07/26/13   Angelica Ran, MD  Fluticasone-Salmeterol (ADVAIR DISKUS) 250-50 MCG/DOSE AEPB Inhale 1 puff into the lungs 2 (two) times daily. 05/24/13   Tanda Rockers, MD  furosemide (LASIX) 40 MG tablet Take 1 tablet (40 mg total) by mouth every other day. 01/12/13   Angelica Ran, MD  Iloprost 20 MCG/ML SOLN Inhale into the lungs. Ventavis-- 6 times daily - Inhalation 07/09/13   Larey Dresser, MD  potassium chloride SA (K-DUR,KLOR-CON) 20 MEQ tablet 2 tabs am and 1 tab po qhs 01/12/13   Angelica Ran, MD  risperiDONE (RISPERDAL) 0.5 MG tablet Take 1 tablet (0.5 mg total) by mouth at bedtime. 01/12/13   Angelica Ran, MD  sildenafil (REVATIO) 20 MG tablet take 4  tablets every 8 hours     Historical Provider, MD  Spacer/Aero Chamber Mouthpiece MISC Use as directed 03/16/13   Collene Gobble, MD   BP 147/72  Pulse 81  Temp(Src) 98.4 F (36.9 C) (Oral)  Resp 20  Ht _0  (1.6 m)  Wt 181 lb (82.101 kg)  BMI 32.07 kg/m2  SpO2 85% Physical Exam  Nursing note and vitals reviewed.  54 year old female, resting comfortably and in no acute distress. Vital signs are significant for hypertension with blood pressure 147/72. Oxygen saturation is 85%, which is hypoxic, but came back up to 95% without treatment. Head is normocephalic and atraumatic. PERRLA, EOMI. Oropharynx is clear. Neck is nontender and supple without adenopathy or JVD. Back is nontender and there is no CVA tenderness. Lungs have rales at the left midlung field. Upper lung fields have inspiratory rhonchi bilaterally. Chest is nontender. Heart has regular rate and rhythm without murmur. Abdomen is soft, flat, nontender without masses  or hepatosplenomegaly and peristalsis is normoactive. Extremities have trace edema, full range of motion is present. Skin is warm and dry without rash. Neurologic: Mental status is normal, cranial nerves are intact, there are no motor or sensory deficits.  ED Course  Procedures (including critical care time) Labs Review Results for orders placed during the hospital encounter of 07/28/13  CBC WITH DIFFERENTIAL      Result Value Ref Range   WBC 8.3  4.0 - 10.5 K/uL   RBC 3.84 (*) 3.87 - 5.11 MIL/uL   Hemoglobin 10.8 (*) 12.0 - 15.0 g/dL   HCT 34.9 (*) 36.0 - 46.0 %  MCV 90.9  78.0 - 100.0 fL   MCH 28.1  26.0 - 34.0 pg   MCHC 30.9  30.0 - 36.0 g/dL   RDW 14.2  11.5 - 15.5 %   Platelets 296  150 - 400 K/uL   Neutrophils Relative % 71  43 - 77 %   Neutro Abs 5.9  1.7 - 7.7 K/uL   Lymphocytes Relative 17  12 - 46 %   Lymphs Abs 1.5  0.7 - 4.0 K/uL   Monocytes Relative 7  3 - 12 %   Monocytes Absolute 0.6  0.1 - 1.0 K/uL   Eosinophils Relative 4  0 - 5 %   Eosinophils Absolute 0.3  0.0 - 0.7 K/uL   Basophils Relative 1  0 - 1 %   Basophils Absolute 0.1  0.0 - 0.1 K/uL  BASIC METABOLIC PANEL      Result Value Ref Range   Sodium 141  137 - 147 mEq/L   Potassium 3.9  3.7 - 5.3 mEq/L   Chloride 101  96 - 112 mEq/L   CO2 28  19 - 32 mEq/L   Glucose, Bld 109 (*) 70 - 99 mg/dL   BUN 13  6 - 23 mg/dL   Creatinine, Ser 0.66  0.50 - 1.10 mg/dL   Calcium 9.6  8.4 - 10.5 mg/dL   GFR calc non Af Amer >90  >90 mL/min   GFR calc Af Amer >90  >90 mL/min  PRO B NATRIURETIC PEPTIDE      Result Value Ref Range   Pro B Natriuretic peptide (BNP) 466.6 (*) 0 - 125 pg/mL   Imaging Review Dg Chest 2 View  07/28/2013   CLINICAL DATA:  Shortness breath.  History of sarcoidosis.  EXAM: CHEST  2 VIEW  COMPARISON:  DG CHEST 2 VIEW dated 05/27/2013  FINDINGS: Cardiac pacemaker. Shallow inspiration. Mild cardiac enlargement without pulmonary vascular congestion. Diffuse coarse fibrosis throughout the lungs  compatible with history of sarcoidosis. No developing consolidation is suggested. Increased density over the lung bases is likely due to soft tissue attenuation. No significant changes since previous study.  IMPRESSION: Diffuse coarse pulmonary fibrosis compatible with history of sarcoidosis. No acute changes since previous study.   Electronically Signed   By: Lucienne Capers M.D.   On: 07/28/2013 06:20    Date: 07/28/2013  Rate: 94  Rhythm: normal sinus rhythm  QRS Axis: normal  Intervals: QT prolonged  ST/T Wave abnormalities: normal  Conduction Disutrbances:none  Narrative Interpretation: Left ventricular hypertrophy, borderline prolonged QT interval. When compared with ECG of 01/04/2013, QT interval has lengthened.  Old EKG Reviewed: changes noted   MDM   Final diagnoses:  Dyspnea  Pulmonary sarcoidosis  Elevated brain natriuretic peptide (BNP) level    Cough with sputum production and subjective fever worrisome for possible pneumonia. Chest x-ray will be obtained and BNP will be checked as well.  BNP has come back mildly elevated. In the amount for a high, it is above what her baseline is and this may be a component of her dyspnea. Remainder of workup is unremarkable. While in the ED, she is noted to have desaturation to the low 80s following which she quickly rebounds back into the low to mid 90s. She is too dyspneic to go home. Case is discussed with Dr. Lacinda Axon of family medicine service who agrees to admit the patient. Of note, she was given a dose of methylprednisolone and to treat possible exacerbation of sarcoidosis.  Delora Fuel, MD  07/28/13 [id number]

## 2013-07-28 NOTE — ED Notes (Signed)
MD Roxanne Mins at bedside.

## 2013-07-28 NOTE — ED Notes (Signed)
Pt given [place] sandwich and water to drink per Dr.Mclean

## 2013-07-28 NOTE — Consult Note (Signed)
CARDIOLOGY CONSULT NOTE  Patient ID: TANEAH MASRI MRN: [id number] DOB/AGE: Oct 11, 1960 53 y.o.  Admit date: 07/28/2013 Primary Cardiologist: Aundra Dubin Reason for Consultation: CHF, history of pulmonary hypertension.   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  She had been quite stable for a couple of years until the last few weeks.  She says that about 3-4 weeks ago, she saw Dr. Lamonte Sakai and he changed her inhaler from Symbicort to Advair.  She dates her subsequent decline to this time.  Since then, she has had the gradual onset of increased exertional dyspnea, progressing to dyspnea at rest over the last day.  She has had a cough productive of yellow sputum for 2-3 weeks now.  Last night, she had symptoms consistent with orthopnea.  She has not taken her temperature but has been diaphoretic on occasion and feels like she has had a fever. WBCs not elevated here.  CXR consistent with chronic sarcoidosis.  She has not gained weight. She is currently on 100% NRBM.    Past Medical History:  1. Sarcoidosis: Stage IV pulmonary sarcoid on home O2. Has been quiescent recently. She has been referred to [facility] for lung transplant evaluation.  2. Cardiomyopathy: Cardiac MRI in 2004 with no evidence for infiltrative disease but EF 40%. Echo (9/10): EF 60%, severely dilated RV with moderately decreased systolic function, moderate RAE, PASP 83 mmHg. Echo (1/12): EF 40%, grade I diastolic dysfunction, moderate biatrial enlargement, moderate RV dilation with normal RV systolic function, peak RV-RA gradient 34 mmHg. Echo (8/13): EF 55-60%, mildly dilated RV with mild systolic dysfunction, PA systolic pressure 45 mmHg.  Echo (10/14) with EF 40-45%, mild MR, mildly dilated RV with mildly decreased systolic function, PA systolic pressure 44 mmHg.  3. Pulmonary hypertension with RV failure: Moderate to severe,  likely secondary to sarcoidosis and parenchymal lung disease/hypoxic pulmonary vasoconstriction. RHC 5/10 showed PA 67/30 (mean 46) with mean PCWP 6 mmHg. Patient started on Revatio in 5/10 without any change in oxygen saturation. 6 minute walk 7/10: 61 m. Echo (9/10): EF 60%, severely dilated RV with moderately decreased systolic function, moderate RAE, PASP 83 mmHg. RHC (9/10) with mean RA 15, PA 74/36, CI 2.8. Repeat RHC after diuresis with mean RA 3, PA 60/22, mean PCWP 4. Echo (1/11): EF 98-11%, grade I diastolic dysfunction, moderately dilated RV, mild to moderate RV dysfunction, moderate to severe TR, PASP 58 mmHg. 6 minute walk (2/11): 122 m. 6 min walk (6/11): 161.5 m. RHC (6/11): mean RA 11, RV 64/15, PA 63/28 mean 43, mean PCWP 14, CI 2.4 thermodilution and 3.2 Fick, PVR 5.3 WU Fick and 7.25 WU thermodilution. Iloprost begun. 6 min walk (10/11): 158 m. 6 min walk (1/12) 273 m. 6 min walk (7/12) 248 m. RHC (8/12): mean RA 2, PA 51/24, mean PA 35, mean PCWP 6, CI 3, PVR 5.1 WU. PASP 45 mmHg on 8/13 echo. 6 minute walk (4/14): 223 m. 6 minute walk (9/14) 299 m.  PASP 44 mmHg on 10/14 echo.  4. 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 Device is now nearing ERI. Given end stage lung disease and normalization of LV function, the device will not be replaced and tachy therapies were turned off.  5. GERD  6. Chronic chest pain. LHC (5/10) with no angiographic coronary disease.  7. Allergic rhinitis,  8. h/o iron deficiency anemia.  9. H/o secondary amenorrhea/irregular  menses,  10. Psychosis with high dose steroids.  11. Possible runs of SVT  12. Recurrent Hypokalemia  13. h/o Rotator cuff sprain  14. 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   ROS: All systems reviewed and negative except as per HPI.   Current Facility-Administered Medications  Medication Dose Route Frequency Provider Last Rate Last Dose  . furosemide (LASIX) injection 40 mg  40 mg Intravenous BID Larey Dresser, MD      . [facility]   Inhalation 6 X Daily Larey Dresser, MD      . potassium chloride SA (K-DUR,KLOR-CON) CR tablet 40 mEq  40 mEq Oral Once Larey Dresser, MD       Current Outpatient Prescriptions  Medication Sig Dispense Refill  . albuterol (PROAIR HFA) 108 (90 BASE) MCG/ACT inhaler Inhale 2 puffs into the lungs 4 (four) times daily.  8.5 g  11  . aspirin 81 MG tablet Take 81 mg by mouth daily.        . budesonide-formoterol (SYMBICORT) 160-4.5 MCG/ACT inhaler Inhale 2 puffs into the lungs 2 (two) times daily.  1 Inhaler  6  . carvedilol (COREG) 3.125 MG tablet Take 1 tablet (3.125 mg total) by mouth 2 (two) times daily with a meal.  180 tablet  3  . esomeprazole (NEXIUM) 40 MG capsule Take 1 capsule (40 mg total) by mouth daily before breakfast.  90 capsule  3  . furosemide (LASIX) 40 MG tablet Take 1 tablet (40 mg total) by mouth every other day.  45 tablet  3  . Iloprost 20 MCG/ML SOLN Inhale into the lungs. Ventavis-- 6 times daily - Inhalation  45 mL  2  . potassium chloride SA (K-DUR,KLOR-CON) 20 MEQ tablet 2 tabs am and 1 tab po qhs  270 tablet  2  . risperiDONE (RISPERDAL) 0.5 MG tablet Take 1 tablet (0.5 mg total) by mouth at bedtime.  90 tablet  3  . sildenafil (REVATIO) 20 MG tablet take 4  tablets every 8 hours       . acetaminophen (TYLENOL) 325 MG tablet Take 650 mg by mouth as needed.        Marland Kitchen Spacer/Aero Chamber [employer] Use as directed  1 each  0     Physical exam Blood pressure 140/72, pulse 88, temperature 98.4 F (36.9 C), temperature source Oral,  resp. rate 24, height _0  (1.6 m), weight 82.101 kg (181 lb), SpO2 94.00%. General: NAD Neck: JVP 8 cm, no thyromegaly or thyroid nodule.  Lungs: Dry crackles diffusely.  CV: Nondisplaced PMI.  Heart regular S1/S2, no S3/S4, no murmur.  No peripheral edema.  No carotid bruit.  Normal pedal pulses.  Abdomen: Soft, nontender, no hepatosplenomegaly, no distention.  Skin: Intact without lesions or rashes.  Neurologic: Alert and oriented x 3.  Psych: Normal affect. Extremities: No clubbing or cyanosis.  HEENT: Normal.   Labs:   Lab Results  Component Value Date   WBC 8.3 07/28/2013   HGB 10.8* 07/28/2013   HCT 34.9* 07/28/2013   MCV 90.9 07/28/2013  PLT 296 07/28/2013    Recent Labs Lab 07/28/13 0506  NA 141  K 3.9  CL 101  CO2 28  BUN 13  CREATININE 0.66  CALCIUM 9.6  GLUCOSE 109*   Radiology:  - CXR: Evidence for fibrosis consistent with sarcoid diagnosis.   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   1. Dyspnea/acute respiratory failure:  Patient has baseline Stage IV sarcoidosis as well as pulmonary arterial HTN with RV failure. Possible infection, perhaps viral, superimposed on her baseline severe sarcoidosis.  Uncertain if this would be a sarcoid flare as her sarcoid has seemed inactive for a number of years (just has chronic scarring).  She has not gained weight and does not appear particularly volume overloaded on exam, no not sure CHF is playing a major role here.  Pro-BNP is not particularly elevated (pro-BNP done in hospital, BNP in office, and pro-BNP tends to run higher in general).   - Agree with low dose steroids and would consider covering with antibiotics.  - I will give her Lasix 40 mg IV bid x 2 doses (takes 40 mg qod at home), but as above do not think CHF plays a major role.  - Would involve pulmonary, she follows  closely with them as an outpatient.  2. Pulmonary arterial hypertension: She has been very stable on Iloprost and Revatio at home for several years.  Last echo in 10/14 showed a mildly dilated and mildly dysfunctional RV with PA systolic pressure 44 mmHg.  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   - Would continue home meds => she apparently will need to bring in [place] from home.  - Would repeat echo to reassess RV and PA pressure. - As above, not sure that CHF plays a major role here but will give 2 doses of IV lasix.  3. Cardiomyopathy: LV EF 40-45% on last echo.  Continue home Coreg.  Had planned to get cardiac MRI but never was done.  Would plan on this when she is more stable.  As above, plan to get echo.   Larey Dresser 07/28/2013 11:04 AM

## 2013-07-28 NOTE — ED Notes (Signed)
Spoke to [employer] on 3E about pt's condition; floor unable to take pt, requesting step down bed for pt's continuous need of nonrebreather. Admitting MD at bedside aware

## 2013-07-28 NOTE — ED Notes (Signed)
Dr.McLean at bedside

## 2013-07-28 NOTE — H&P (Signed)
[facility] Admission History and Physical Service Pager: [phone]  Patient name: Rachel [name] Medical record number: [id number] Date of birth: Apr 30, 1960 Age: 53 y.o. Gender: female  Primary Care Provider: Dewain Penning, MD Consultants: Cardiology Code Status: Full Code  Chief Complaint: SOB  Assessment and Plan: Rachel [name] is a 53 y.o. female presenting with dyspnea. PMH is significant for CHF (combined systolic, diastolic and RV), GERD, HTN, Sarcoidosis, and Pulmonary HTN (on Home O2 [place] 4-5L).   Acute dyspnea - multifactorial in nature given severe sarcoidosis, pulmonary hypertension, and heart failure. Chest x-ray revealed diffuse pulmonary fibrosis; no acute infiltrate.  Pro BNP 466. - Will admit to step down given desaturations in the ED - Will treat with low dose prednisone (give history of psychosis with high doses), Duonebs, and Dulera - Also giving trial of Lasix given elevated BNP (significantly elevated from priors) - Continue supplemental O2  CHF (combined systolic, diastolic and RV) & Pulmonary HTN - Will consult cardiology given complex history and Pulmonary HTN.  - Continuing Revatio; also continuing Iloprost, but patient will have to bring in as we do not have it in the hospital - Lasix IV 40 mg x 1.  Strict I/O.  Will evaluate for improvement with diuresis and will add additional Lasix if needed.  Continuing home Coreg.   GERD - Protonix daily  HTN - Continuing home Coreg.  FEN/GI: NPO except sips w/meds given current dyspnea.   Prophylaxis: Lovenox  Disposition: Pending clinical improvement.   History of Present Illness:  Rachel [name] is a 53 y.o. female with a complex PMH including CHF (combined systolic, diastolic and RV), GERD, HTN, Stage IV Sarcoidosis, and Pulmonary HTN (on Home O2  4-6 L) who presents with progressive dyspnea.  Patient reports that for the past 3 weeks she had gradual increasing SOB.  This started  after she was taken off Symbicort due to affordability.  She reports associated productive cough, subjective fever, and chills.  She has some chest discomfort with the cough and it is reproducible with palpation.  Her symptoms worsened acutely last night, forcing her to come in for evaluation.    ROS: Per HPI with the following additions: No PND or orthopnea.  No increasing edema.    Review Of Systems: Per HPI. Otherwise 12 point review of systems was performed and was unremarkable.  Patient Active Problem List   Diagnosis Date Noted  . Dyspnea 07/28/2013  . Bruising 10/22/2012  . Acute sinusitis, unspecified 07/20/2012  . Encounter for medication refill 06/08/2012  . Overweight 11/05/2011  . CONGESTIVE HEART FAILURE, RIGHT 09/21/2008  . PULMONARY HYPERTENSION 09/05/2008  . SINUS TACHYCARDIA 08/22/2008  . PULMONARY SARCOIDOSIS 05/03/2008  . ANXIETY 06/05/2006  . HYPERTENSION, BENIGN SYSTEMIC 06/05/2006  . CARDIOMYOPATHY, IDIOPATHIC 06/05/2006  . GASTROESOPHAGEAL REFLUX, NO ESOPHAGITIS 06/05/2006   Past Medical History: Past Medical History  Diagnosis Date  . GERD (gastroesophageal reflux disease)   . Hypertension   . 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 CHF (congestive heart failure)     Right side- Dr. Aundra Dubin ([facility])  . Pulmonary HTN   . Anxiety   . Cardiomyopathy   . NSVT (nonsustained ventricular tachycardia)     with syncope: St Jude ICD was implanted. Device now nearing ERI. Given end stage lung disease and normalization of LV function, the device will not be replaced and tachy therapies were turned off  . Chronic chest pain     LHC (  08/2008) with no angiographic coronary diease  . Allergic rhinitis   . H/O: iron deficiency anemia   . Secondary amenorrhea     irregular menses  . Psychosis     with high dose steroids  . SVT (supraventricular tachycardia)     possible runs  . Hypokalemia     recurrent  . Rotator cuff sprain   . Chronic back pain    Past  Surgical History: Past Surgical History  Procedure Laterality Date  . Cardiac catheterization  08/26/2008    5% EF with normal coronary arteries and normal wall motion  . Adenosine cardiolyte  03/31/2003    no ischemia/infarct; marked global LV hypekinesis; resting EF 22%  . Cardiac catheterization  03/31/2003    globally depressed LV fxn with EF 20%; idiopathic dilated cardiomyopathy  . Defibrillator placed  03/31/2003    St. Jude single chamber (Dr. Cristopher Peru)  . Pfts      FVC 2000 (56%) FEV1 1400 (47%), ratio 0.7; insignif response to bronchodilatior; stable from 1/05 - 9/05; PFTs: Mod restriction, FVC 1.9L, FEV1 1.6L, both 53% predicted; slt response to brochodilators 04/08/2002   Social History: History  Substance Use Topics  . Smoking status: Former Smoker -- 1.50 packs/day for 11 years    Types: Cigarettes    Quit date: 04/08/1992  . Smokeless tobacco: Never Used     Comment: 1ppd x 20 years  . Alcohol Use: Yes     Comment: remote history of alcohol abuse (quit 1994)    Family History: Family History  Problem Relation Age of Onset  . Lung cancer Father   . Cancer Father     lung cancer  . Hypertension    . Diabetes Mother     border line  . Hypertension Mother   . Heart failure Mother    Allergies and Medications: Allergies  Allergen Reactions  . Latex Rash  . Nitroglycerin Other (See Comments)    Patient is on Revatio  . Ace Inhibitors   . Meperidine Hcl Other (See Comments)    REACTION: Makes her feel funny  . Other Other (See Comments)    High Dose Steroids cause chemical imbalance and altered mental status  . Tramadol Hcl Other (See Comments)    REACTION: nausea, lightheadedness, sleepiness, and dizziness  . Propoxyphene N-Acetaminophen Rash    REACTION: rash: pruritus   No current facility-administered medications on file prior to encounter.   Current Outpatient Prescriptions on File Prior to Encounter  Medication Sig Dispense Refill  . albuterol  (PROAIR HFA) 108 (90 BASE) MCG/ACT inhaler Inhale 2 puffs into the lungs 4 (four) times daily.  8.5 g  11  . aspirin 81 MG tablet Take 81 mg by mouth daily.        . budesonide-formoterol (SYMBICORT) 160-4.5 MCG/ACT inhaler Inhale 2 puffs into the lungs 2 (two) times daily.  1 Inhaler  6  . carvedilol (COREG) 3.125 MG tablet Take 1 tablet (3.125 mg total) by mouth 2 (two) times daily with a meal.  180 tablet  3  . esomeprazole (NEXIUM) 40 MG capsule Take 1 capsule (40 mg total) by mouth daily before breakfast.  90 capsule  3  . furosemide (LASIX) 40 MG tablet Take 1 tablet (40 mg total) by mouth every other day.  45 tablet  3  . Iloprost 20 MCG/ML SOLN Inhale into the lungs. Ventavis-- 6 times daily - Inhalation  45 mL  2  . potassium chloride SA (K-DUR,KLOR-CON) 20 MEQ  tablet 2 tabs am and 1 tab po qhs  270 tablet  2  . risperiDONE (RISPERDAL) 0.5 MG tablet Take 1 tablet (0.5 mg total) by mouth at bedtime.  90 tablet  3  . sildenafil (REVATIO) 20 MG tablet take 4  tablets every 8 hours       . acetaminophen (TYLENOL) 325 MG tablet Take 650 mg by mouth as needed.        Marland Kitchen Spacer/Aero Chamber [employer] Use as directed  1 each  0    Objective: BP 140/72  Pulse 88  Temp(Src) 98.4 F (36.9 C) (Oral)  Resp 24  Ht _0  (1.6 m)  Wt 181 lb (82.101 kg)  BMI 32.07 kg/m2  SpO2 94% Exam: General: chronically ill appearing female, on supplemental O2, in NAD HEENT: NCAT. Sclera anicteric. Cardiovascular: Tachycardic.  Regular rhythm. No m/r/g. Respiratory: Diffuse rales throughout all lung fields.  Abdomen: soft, nontender, nondistended. No palpable organomegaly. Extremities: Trace LE edema.  Skin: warm, dry, intact. Neuro: No focal deficits.  Labs and Imaging: CBC BMET   Recent Labs Lab 07/28/13 0506  WBC 8.3  HGB 10.8*  HCT 34.9*  PLT 296    Recent Labs Lab 07/28/13 0506  NA 141  K 3.9  CL 101  CO2 28  BUN 13  CREATININE 0.66  GLUCOSE 109*  CALCIUM 9.6     Dg  Chest 2 View 07/28/2013    IMPRESSION: Diffuse coarse pulmonary fibrosis compatible with history of sarcoidosis. No acute changes since previous study.   Coral Spikes, DO 07/28/2013, 8:14 AM PGY-2, [place] Intern pager: [phone], text pages welcome

## 2013-07-28 NOTE — ED Notes (Signed)
Pt back from XR

## 2013-07-28 NOTE — Progress Notes (Signed)
Echocardiogram 2D Echocardiogram has been performed.  Alyson Locket Rosland Riding 07/28/2013, 2:41 PM

## 2013-07-28 NOTE — ED Notes (Addendum)
Pt reports dizziness, headache, and nausea at this time. Pt requesting 4L [place] via O2; sats at 95% on 4L; relief of HA after changing from NRB to [facility]

## 2013-07-28 NOTE — Progress Notes (Signed)
PCP note:   Pt stable appearing this evening on 6L via nasal cannula but continuing to complain of fatigue and decreased appetite. She had several questions about why she was taking certain medications that I did my best to answer. Additionally, I went over the results of her echo. I appreciate the efforts of the family medicine, cardiology and pulmonology team in providing her care.   Please let me know if I can be of any assistance.   Marena Chancy Maricela Bo, MD, MBA 07/28/2013, 6:11 PM Family Medicine Resident, PGY-3

## 2013-07-28 NOTE — ED Notes (Signed)
Pt O2 sats on 4L [place] 97%; Pt placed on NRB at 10L, sats 100%

## 2013-07-28 NOTE — Progress Notes (Signed)
Pt states she feels extremely fatigued, and does not believe she can tolerate lasix tonight, MD made aware and is ok with holding the lasix IV tonight, will continue to monitor. Also pt does not want to take [facility] inhaler as it has made her "feel terrible in the past and that's why I switched to simbacort". MD also aware and pt asked to bring in home dose to give.

## 2013-07-28 NOTE — ED Notes (Signed)
Admitting MD at bedside.

## 2013-07-28 NOTE — ED Notes (Signed)
Patient transported to X-ray. Pt placed on NRB for transport to XR. Pt's O2 saturation prior to transport 99% on 15 lpm

## 2013-07-28 NOTE — ED Notes (Signed)
Notified Flow Manager change bed from Telemetry to Step Down.

## 2013-07-28 NOTE — ED Notes (Signed)
Pt coming from home with c/o shortness of breath for a week. Pt is usually on 4-5 lpm of O2 at home. Pt reports increased shortness of breath with exertion. Pt reports yellow sputum with cough. Pt A&Ox4, respirations equal and unlabored, skin warm and dry

## 2013-07-28 NOTE — Consult Note (Signed)
Name: Rachel [name] MRN: [id number] DOB: February 03, 1961    ADMISSION DATE:  07/28/2013 CONSULTATION DATE:  07/28/13  REFERRING MD :  Dr. Andria Frames / FPTS PRIMARY SERVICE:  FPTS  CHIEF COMPLAINT:  Dyspnea / [name]  BRIEF PATIENT DESCRIPTION: 53 y/o F with PMH of O2 dependent sarcoidosis, PAH who presented to [facility] ER on 4/22 with 3 wk hx of worsening dyspnea, fatigue.    SIGNIFICANT EVENTS / STUDIES:  4/22 - admit with worsening DOE, fatigue x 3 wks, 1 day of chills/sweats, mild increase in swelling   LINES / TUBES:  CULTURES: Sputum 4/22>>>  ANTIBIOTICS:  HISTORY OF PRESENT ILLNESS:  53 y/o F with PMH of HTN, Anxiety, GERD, NSVT s/p pacemaker, O2 dependent sarcoidosis, & PAH (PA 44 in 2014 by ECHO) who presented to [facility] ER on 4/22 with 3 wk hx of worsening dyspnea, fatigue.  Patient reports she first noticed worsening fatigue requiring rest breaks for usual activities that did not require rest.  She thought she might have a cold and it would "get better".  At baseline, she is able to walk from her room to bathroom and kitchen without rest but as of last 3 weeks she could not without stopping for rest.  She has noted headaches with periods of low saturations, cough with yellow sputum production and chills/sweats.  Patient denies chest pain, pain with inspiration, n/v/d, abdominal pain & hemoptysis.  ER evaluation noted essentially normal BMP (glucose of 109), Hgb 10.8, BNP 466 and cxr with findings consistent with scarring but no acute infiltrate.  PCCM consulted for evaluation.   PAST MEDICAL HISTORY :  Past Medical History  Diagnosis Date  . GERD (gastroesophageal reflux disease)   . Hypertension   . 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 CHF (congestive heart failure)     Right side- Dr. Aundra Dubin ([facility])  . Pulmonary HTN   . Anxiety   . Cardiomyopathy   . NSVT (nonsustained ventricular tachycardia)     with syncope: St Jude ICD was implanted. Device now nearing ERI. Given end stage lung  disease and normalization of LV function, the device will not be replaced and tachy therapies were turned off  . Chronic chest pain     LHC (08/2008) with no angiographic coronary diease  . Allergic rhinitis   . H/O: iron deficiency anemia   . Secondary amenorrhea     irregular menses  . Psychosis     with high dose steroids  . SVT (supraventricular tachycardia)     possible runs  . Hypokalemia     recurrent  . Rotator cuff sprain   . Chronic back pain    Past Surgical History  Procedure Laterality Date  . Cardiac catheterization  08/26/2008    5% EF with normal coronary arteries and normal wall motion  . Adenosine cardiolyte  03/31/2003    no ischemia/infarct; marked global LV hypekinesis; resting EF 22%  . Cardiac catheterization  03/31/2003    globally depressed LV fxn with EF 20%; idiopathic dilated cardiomyopathy  . Defibrillator placed  03/31/2003    St. Jude single chamber (Dr. Cristopher Peru)  . Pfts      FVC 2000 (56%) FEV1 1400 (47%), ratio 0.7; insignif response to bronchodilatior; stable from 1/05 - 9/05; PFTs: Mod restriction, FVC 1.9L, FEV1 1.6L, both 53% predicted; slt response to brochodilators 04/08/2002   Prior to Admission medications   Medication Sig Start Date End Date Taking? Authorizing Provider  albuterol (PROAIR  HFA) 108 (90 BASE) MCG/ACT inhaler Inhale 2 puffs into the lungs 4 (four) times daily. 01/12/13  Yes Angelica Ran, MD  aspirin 81 MG tablet Take 81 mg by mouth daily.     Yes Historical Provider, MD  budesonide-formoterol (SYMBICORT) 160-4.5 MCG/ACT inhaler Inhale 2 puffs into the lungs 2 (two) times daily. 07/20/13  Yes Collene Gobble, MD  carvedilol (COREG) 3.125 MG tablet Take 1 tablet (3.125 mg total) by mouth 2 (two) times daily with a meal. 01/12/13  Yes Angelica Ran, MD  esomeprazole (NEXIUM) 40 MG capsule Take 1 capsule (40 mg total) by mouth daily before breakfast. 07/26/13  Yes Angelica Ran, MD  furosemide (LASIX) 40 MG tablet  Take 1 tablet (40 mg total) by mouth every other day. 01/12/13  Yes Angelica Ran, MD  Iloprost 20 MCG/ML SOLN Inhale into the lungs. Ventavis-- 6 times daily - Inhalation 07/09/13  Yes Larey Dresser, MD  potassium chloride SA (K-DUR,KLOR-CON) 20 MEQ tablet 2 tabs am and 1 tab po qhs 01/12/13  Yes Angelica Ran, MD  risperiDONE (RISPERDAL) 0.5 MG tablet Take 1 tablet (0.5 mg total) by mouth at bedtime. 01/12/13  Yes Angelica Ran, MD  sildenafil (REVATIO) 20 MG tablet take 4  tablets every 8 hours    Yes Historical Provider, MD  acetaminophen (TYLENOL) 325 MG tablet Take 650 mg by mouth as needed.      Historical Provider, MD  Spacer/Aero Chamber Mouthpiece MISC Use as directed 03/16/13   Collene Gobble, MD   Allergies  Allergen Reactions  . Latex Rash  . Nitroglycerin Other (See Comments)    Patient is on Revatio  . Ace Inhibitors   . Meperidine Hcl Other (See Comments)    REACTION: Makes her feel funny  . Other Other (See Comments)    High Dose Steroids cause chemical imbalance and altered mental status  . Tramadol Hcl Other (See Comments)    REACTION: nausea, lightheadedness, sleepiness, and dizziness  . Propoxyphene N-Acetaminophen Rash    REACTION: rash: pruritus    FAMILY HISTORY:  Family History  Problem Relation Age of Onset  . Lung cancer Father   . Cancer Father     lung cancer  . Hypertension    . Diabetes Mother     border line  . Hypertension Mother   . Heart failure Mother    SOCIAL HISTORY:  reports that she quit smoking about 21 years ago. Her smoking use included Cigarettes. She has a 16.5 pack-year smoking history. She has never used smokeless tobacco. She reports that she drinks alcohol. She reports that she does not use illicit drugs.  REVIEW OF SYSTEMS:   Constitutional: Negative for fever, chills, weight loss, malaise/fatigue and diaphoresis.  HENT: Negative for hearing loss, ear pain, nosebleeds, congestion, sore throat, neck pain,  tinnitus and ear discharge.   Eyes: Negative for blurred vision, double vision, photophobia, pain, discharge and redness.  Respiratory: Negative for wheezing and stridor.  SEE HPI.   Cardiovascular: Negative for chest pain, palpitations, orthopnea, claudication, leg swelling and PND.  Gastrointestinal: Negative for heartburn, nausea, vomiting, abdominal pain, diarrhea, constipation, blood in stool and melena.  Genitourinary: Negative for dysuria, urgency, frequency, hematuria and flank pain.  Musculoskeletal: Negative for myalgias, back pain, joint pain and falls.  Skin: Negative for itching and rash.  Neurological: Negative for dizziness, tingling, tremors, sensory change, speech change, focal weakness, seizures, loss of consciousness, weakness and headaches.  Endo/Heme/Allergies: Negative for  environmental allergies and polydipsia. Does not bruise/bleed easily.  SUBJECTIVE:   VITAL SIGNS: Temp:  [98.4 F (36.9 C)] 98.4 F (36.9 C) (04/22 0447) Pulse Rate:  [72-106] 88 (04/22 0757) Resp:  [13-31] 24 (04/22 0757) BP: (110-147)/(63-93) 140/72 mmHg (04/22 0757) SpO2:  [85 %-100 %] 94 % (04/22 0757) Weight:  [181 lb (82.101 kg)] 181 lb (82.101 kg) (04/22 0447)  PHYSICAL EXAMINATION: General:  Obese female in NAD Neuro:  AAOx4, speech clear, MAE HEENT:  Mm pink/moist, no jvd Cardiovascular:  s1s2 rrr, no m/r/g Lungs:  resp's even/non-labored, lungs bilaterally mildly coarse, no wheezes or crackles Abdomen: round/soft, bsx4 active Musculoskeletal:  No acute deformities  Skin:  1+ edema in BLE   Recent Labs Lab 07/28/13 0506  NA 141  K 3.9  CL 101  CO2 28  BUN 13  CREATININE 0.66  GLUCOSE 109*    Recent Labs Lab 07/28/13 0506  HGB 10.8*  HCT 34.9*  WBC 8.3  PLT 296   Dg Chest 2 View  07/28/2013   CLINICAL DATA:  Shortness breath.  History of sarcoidosis.  EXAM: CHEST  2 VIEW  COMPARISON:  DG CHEST 2 VIEW dated 05/27/2013  FINDINGS: Cardiac pacemaker. Shallow  inspiration. Mild cardiac enlargement without pulmonary vascular congestion. Diffuse coarse fibrosis throughout the lungs compatible with history of sarcoidosis. No developing consolidation is suggested. Increased density over the lung bases is likely due to soft tissue attenuation. No significant changes since previous study.  IMPRESSION: Diffuse coarse pulmonary fibrosis compatible with history of sarcoidosis. No acute changes since previous study.   Electronically Signed   By: Lucienne Capers M.D.   On: 07/28/2013 06:20    ASSESSMENT / PLAN:  Acute Dyspnea - ddx includes acute viral illness, progression of PAH/RH disease, acute sarcoid flare (less likely) Stage IV Pulmonary Sarcoidosis  Oxygen Dependent - 6 L baseline PAH - on revatio 80 mg TID & inhaled Iloprost.  RHC 8/12>PA51/24 Combined CHF / RV Failure  Plan: -assess ambulatory desaturations prior to discharge on 6L, this may be progression of disease and she may benefit from an oxymizer to deliver higher flow O2 -low dose steroids (41m QD), however, suspect given end stage sarcoid this will be of little benefit -no benefit escalation steroids -continue sildenafil & iloprost -diuresis per CHF SVC, likely this would be the most important therapy to attempt to improve back to her baseline -empiric levaquin, high risk gram neg res org, so if declines would covera pseudo -assess ECHO for PA pressure escallation  -titrate oxygen for sats >92-94% with PAH, baseline 6L -duoneb Q6 + PRN Albuterol  -lasix, renal fxn appears to be able to handle this -if no improvement lasix, could consider CT repeat chest for progression of sarcoid further  GLOBAL: Extensive discussion with patient regarding goals of care given her underlying pulmonary disease and potential need for intubation if decompensation.  She is open to short term support with mechanical ventilation if she can be returned to current functional state.  She understands that she would  likely have a difficult time weaning from mechanical ventilation.  She would not want long term support, trach or facility living.    07/28/2013, 11:02 AM  I have fully examined this patient and agree with above findings.    And edited in full  DLavon Paganini FTitus Mould MD, FTecumsehPgr: 3[facility]ulmonary [facility] Care

## 2013-07-28 NOTE — ED Notes (Signed)
Per Dr.Mclean, ekg completed in ED; ekg order discontinued

## 2013-07-28 NOTE — H&P (Signed)
Seen and examined.  Discussed with Dr. Lacinda Axon.  Agree with his documentation and management.  Briefly, 53 yo female well known to me who presents with worsening dyspnea.  She see no clear reason.  She possibly attributes to recent formulary changes with her COPD meds.  Infection is a possibility with her having hot and cold spells and a change in her productive cough pattern.  She has not gained weight and does not seem to be volume overloaded - a concern with her right heart failure.  The underlying cause of her COPD is sarcoidosis.  Of note, she has also had psychosis with high doses of steroids.  Agree with RX of typical pulm meds except low dose steroids, include levofloxicin.  Gentle diuresis.  Careful clinical monitor.

## 2013-07-29 ENCOUNTER — Ambulatory Visit: Payer: Self-pay | Admitting: Family Medicine

## 2013-07-29 DIAGNOSIS — I428 Other cardiomyopathies: Secondary | ICD-10-CM

## 2013-07-29 DIAGNOSIS — I2789 Other specified pulmonary heart diseases: Secondary | ICD-10-CM

## 2013-07-29 DIAGNOSIS — J99 Respiratory disorders in diseases classified elsewhere: Secondary | ICD-10-CM

## 2013-07-29 DIAGNOSIS — I10 Essential (primary) hypertension: Secondary | ICD-10-CM

## 2013-07-29 DIAGNOSIS — D869 Sarcoidosis, unspecified: Principal | ICD-10-CM

## 2013-07-29 LAB — BASIC METABOLIC PANEL
BUN: 16 mg/dL (ref 6–23)
CALCIUM: 9.5 mg/dL (ref 8.4–10.5)
CO2: 28 meq/L (ref 19–32)
CREATININE: 0.76 mg/dL (ref 0.50–1.10)
Chloride: 99 mEq/L (ref 96–112)
GFR calc Af Amer: >90 mL/min (ref >90)
GLUCOSE: 120 mg/dL — AB (ref 70–99)
Potassium: 4.2 mEq/L (ref 3.7–5.3)
Sodium: 140 mEq/L (ref 137–147)

## 2013-07-29 LAB — CBC
HCT: 32.9 % — ABNORMAL LOW (ref 36.0–46.0)
Hemoglobin: 10.4 g/dL — ABNORMAL LOW (ref 12.0–15.0)
MCH: 28.3 pg (ref 26.0–34.0)
MCHC: 31.6 g/dL (ref 30.0–36.0)
MCV: 89.4 fL (ref 78.0–100.0)
PLATELETS: 270 10*3/uL (ref 150–400)
RBC: 3.68 MIL/uL — ABNORMAL LOW (ref 3.87–5.11)
RDW: 14 % (ref 11.5–15.5)
WBC: 6.5 10*3/uL (ref 4.0–10.5)

## 2013-07-29 MED ORDER — BUDESONIDE-FORMOTEROL FUMARATE 160-4.5 MCG/ACT IN AERO
2.0000 | INHALATION_SPRAY | Freq: Two times a day (BID) | RESPIRATORY_TRACT | Status: DC
  Administered 2013-07-29 – 2013-07-31 (×5): 2 via RESPIRATORY_TRACT
  Filled 2013-07-29: qty 6

## 2013-07-29 MED ORDER — FUROSEMIDE 40 MG PO TABS: 40.0000 mg | ORAL_TABLET | ORAL | Status: DC

## 2013-07-29 NOTE — Progress Notes (Signed)
Family Medicine Teaching Service Daily Progress Note Intern Pager: [phone]  Patient name: Rachel [name] Medical record number: [id number] Date of birth: July 31, 1960 Age: 53 y.o. Gender: female  Primary Care Provider: Dewain Penning, MD Consultants: Cardiology, Pulmonology Code Status: Full code  Pt Overview and Major Events to Date:  4/22: patient admitted  Assessment and Plan: Rachel [name] is a 53 y.o. female presenting with dyspnea. PMH is significant for CHF (combined systolic, diastolic and RV), GERD, HTN, Sarcoidosis, and Pulmonary HTN (on Home O2 [place] 4-5L).   # Acute dyspnea - multifactorial in nature given severe sarcoidosis, pulmonary hypertension, and heart failure. Chest x-ray revealed diffuse pulmonary fibrosis; no acute infiltrate. Pro BNP 466.   Pulmonology recommendations 1. duoneb q6 + albuterol PRN 2. emperic Levaquin 3. Assess ambulatory O2; may require oxymizer to deliver higher flow O2  Continue Duoneb  Continue Dulera  Continue supplemental O2  Obtain ambulatory O2 later today  # CHF (combined systolic, diastolic and RV): Patient with ICD. Repeat echo shows EF 60%. PA pressure not obtained  Cardiology recommendations: 1. Gave lasix x2 for diuresis 2. Repeat Echo 3. Consider cardiac MRI  Continuing home Coreg  Strict in/out  Daily weight  Follow-up PA pressure  # Pulmonary HTN  Cardiology/Pulmonology recommendations: 1. Echo as in above (previous PA systolic pressure of 17BLTJ)  Continue sildenafil  Continue home Iloprost (patient to bring from home)  # Hx of pulmonary sarcoidosis  Continue prednisone  # GERD   Protonix daily   # HTN   Continuing home Coreg  FEN/GI: Heart healthy  Prophylaxis: Lovenox  Disposition: Transfer out of stepdown  Subjective:  Patient feels like she's "been hit by a truck." States that she just feels tired from having to work hard previously. Overall, she feels like she is getting better. No  shortness of breath or chest pain.  Objective: Temp:  [97.8 F (36.6 C)-98.9 F (37.2 C)] 98.4 F (36.9 C) (04/23 0348) Pulse Rate:  [77-104] 77 (04/23 0348) Resp:  [13-30] 13 (04/23 0348) BP: (108-162)/(53-119) 114/95 mmHg (04/23 0348) SpO2:  [93 %-100 %] 100 % (04/23 0348) Weight:  [175 lb 0.7 oz (79.4 kg)-175 lb 7.8 oz (79.6 kg)] 175 lb 0.7 oz (79.4 kg) (04/23 0443)  04/22 0701 - 04/23 0700 In: 240 [P.O.:240] Out: 1850 [Urine:1850]  Physical Exam: General: Laying in bed in no acute distress Cardiovascular: Regular rate/rhythm, no murmur Respiratory: Clear to auscultation bilaterally, coarse breath sounds, no wheezing Abdomen: Soft, non-tender, non-distended Extremities: Large calfs but at baseline. No tenderness, asymmetry, erythema or edema bilaterally.  Laboratory:  Recent Labs Lab 07/28/13 0506 07/29/13 0340  WBC 8.3 6.5  HGB 10.8* 10.4*  HCT 34.9* 32.9*  PLT 296 270    Recent Labs Lab 07/28/13 0506 07/29/13 0340  NA 141 140  K 3.9 4.2  CL 101 99  CO2 28 28  BUN 13 16  CREATININE 0.66 0.76  CALCIUM 9.6 9.5  GLUCOSE 109* 120*    Imaging/Diagnostic Tests:  Dg Chest 2 View  07/28/2013   CLINICAL DATA:  Shortness breath.  History of sarcoidosis.  EXAM: CHEST  2 VIEW  COMPARISON:  DG CHEST 2 VIEW dated 05/27/2013  FINDINGS: Cardiac pacemaker. Shallow inspiration. Mild cardiac enlargement without pulmonary vascular congestion. Diffuse coarse fibrosis throughout the lungs compatible with history of sarcoidosis. No developing consolidation is suggested. Increased density over the lung bases is likely due to soft tissue attenuation. No significant changes since previous study.  IMPRESSION: Diffuse coarse pulmonary fibrosis  compatible with history of sarcoidosis. No acute changes since previous study.   Electronically Signed   By: Lucienne Capers M.D.   On: 07/28/2013 06:20    Cordelia Poche, MD 07/29/2013, 6:36 AM PGY-1, [place] Intern  pager: [phone], text pages welcome

## 2013-07-29 NOTE — Progress Notes (Signed)
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 She had been quite stable for a couple of years until the last few weeks. She says that about 3-4 weeks ago, she saw Dr. Lamonte Sakai and he changed her inhaler from Symbicort to Advair. She dates her subsequent decline to this time. Since then, she has had the gradual onset of increased exertional dyspnea, progressing to dyspnea at rest over the last day. She has had a cough productive of yellow sputum for 2-3 weeks now. Last night, she had symptoms consistent with orthopnea. She has not taken her temperature but has been diaphoretic on occasion and feels like she has had a fever. WBCs not elevated here. CXR consistent with chronic sarcoidosis. She has not gained weight. She was placed on 100% NRBM.   Yesterday she received steroids, antibiotics, and IV lasix. Oxygen weaned to 4 liters. Weight dow 5 pounds. Complaining of mild dizziness. Breathing back to baseline.    ECHO 07/29/13 EF 60% RV normal.   Objective:   Weight Range:  Vital Signs:   Temp:  [97.8 F (36.6 C)-98.9 F (37.2 C)] 98.1 F (36.7 C) (04/23 0807) Pulse Rate:  [77-104] 77 (04/23 0348) Resp:  [13-30] 13 (04/23 0348) BP: (108-162)/(55-119) 114/95 mmHg (04/23 0348) SpO2:  [93 %-100 %] 98 % (04/23 1010) Weight:  [170 lb (77.111 kg)-175 lb 7.8 oz (79.6 kg)] 170 lb (77.111 kg) (04/23 1010) Last BM Date: 07/27/13  Weight change: Filed Weights   07/28/13 1208 07/29/13 0443 07/29/13 1010  Weight: 175 lb 7.8 oz (79.6 kg) 175 lb 0.7 oz (79.4 kg) 170 lb (77.111 kg)    Intake/Output:   Intake/Output Summary (Last 24 hours) at 07/29/13 1023 Last data filed at 07/29/13 0807  Gross per 24 hour  Intake    240 ml  Output   1850 ml  Net  -1610 ml     Physical Exam: General: NAD Sitting on the side of the bed.  Neck: JVP 5-6 cm,  no thyromegaly or thyroid nodule.  Lungs: Clear on 4 liters [place].   CV: Nondisplaced PMI. Heart regular S1/S2, no S3/S4, no murmur. No peripheral edema. No carotid bruit. Normal pedal pulses.  Abdomen: Soft, nontender, no hepatosplenomegaly, no distention.  Skin: Intact without lesions or rashes.  Neurologic: Alert and oriented x 3.  Psych: Normal affect.  Extremities: No clubbing or cyanosis.  HEENT: Normal.    Telemetry: SR 70s    Labs: Basic Metabolic Panel:  Recent Labs Lab 07/28/13 0506 07/29/13 0340  NA 141 140  K 3.9 4.2  CL 101 99  CO2 28 28  GLUCOSE 109* 120*  BUN 13 16  CREATININE 0.66 0.76  CALCIUM 9.6 9.5    Liver Function Tests: No results found for this basename: AST, ALT, ALKPHOS, BILITOT, PROT, ALBUMIN,  in the last 168 hours No results found for this basename: LIPASE, AMYLASE,  in the last 168 hours No results found for this basename: AMMONIA,  in the last 168 hours  CBC:  Recent Labs Lab 07/28/13 0506 07/29/13 0340  WBC 8.3 6.5  NEUTROABS 5.9  --   HGB 10.8* 10.4*  HCT 34.9* 32.9*  MCV 90.9 89.4  PLT 296 270    Cardiac Enzymes: No results found for this basename: CKTOTAL, CKMB, CKMBINDEX, TROPONINI,  in the last 168 hours  BNP: BNP (last 3 results)  Recent Labs  01/04/13 1437 07/28/13 0513  PROBNP 22.0 466.6*     Other results:     Imaging: Dg Chest 2 View  07/28/2013   CLINICAL DATA:  Shortness breath.  History of sarcoidosis.  EXAM: CHEST  2 VIEW  COMPARISON:  DG CHEST 2 VIEW dated 05/27/2013  FINDINGS: Cardiac pacemaker. Shallow inspiration. Mild cardiac enlargement without pulmonary vascular congestion. Diffuse coarse fibrosis throughout the lungs compatible with history of sarcoidosis. No developing consolidation is suggested. Increased density over the lung bases is likely due to soft tissue attenuation. No significant changes since previous study.  IMPRESSION: Diffuse coarse pulmonary fibrosis compatible with history of  sarcoidosis. No acute changes since previous study.   Electronically Signed   By: Lucienne Capers M.D.   On: 07/28/2013 06:20     Medications:     Scheduled Medications: . aspirin EC  81 mg Oral Daily  . budesonide-formoterol  2 puff Inhalation BID  . carvedilol  3.125 mg Oral BID WC  . enoxaparin (LOVENOX) injection  40 mg Subcutaneous Q24H  . furosemide  40 mg Intravenous BID  . Iloprost  1 mL Inhalation 6 times per day  . ipratropium-albuterol  3 mL Nebulization Q6H  . levofloxacin (LEVAQUIN) IV  750 mg Intravenous Q24H  . pantoprazole  40 mg Oral Daily  . predniSONE  10 mg Oral Q breakfast  . risperiDONE  0.5 mg Oral QHS  . sildenafil  80 mg Oral TID  . 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  1. Dyspnea/acute respiratory failure: Patient has baseline Stage IV sarcoidosis as well as pulmonary arterial HTN with RV failure. Possible infection, perhaps viral, superimposed on her baseline severe sarcoidosis. Uncertain if this would be a sarcoid flare as her sarcoid has seemed inactive for a number of years (just has chronic scarring). She has not gained weight and does not appear particularly volume overloaded on exam, no not sure CHF is playing a major role here. Pro-BNP is not particularly elevated (pro-BNP done in hospital, BNP in office, and pro-BNP tends to run higher in general).  - Continue  low dose steroids and with antibiotics.  - Volume status improved. Dizzy this am . Will stop IV lasix and restart lasix 40 mg every other day on Saturday.  Renal function stable.  2. Pulmonary arterial hypertension: She has been very stable on Iloprost and Revatio at home for several years. Last echo in 10/14 showed a  mildly dilated and mildly dysfunctional RV with PA systolic pressure 44 mmHg. 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  - Would continue home meds => she apparently will need to bring in [place] from home. Continue sildenafil 80 mg tid.  - ECHO EF 60% RV normal   - Received 2 doses IV lasix with weight down 5 pounds. 3. Cardiomyopathy: LV EF 60% and RV normal. Continue home Coreg.   Length of Stay: 1  Amy D Clegg NP-C  07/29/2013, 10:23 AM  Advanced Heart Failure Team Pager [phone] (M-F; 7a - 4p)  Please contact [facility] for night-coverage after hours (4p -7a ) and weekends on [email or web address]  Patient  seen with NP, agree with the above note.  She diuresed with IV Lasix and looks euvolemic.  Echo looks better with normal EF and normal-appearing RV.  Will continue pulmonary HTN meds and Lasix back to home dosing.  Treatment of acute bronchitis/respiratory failure per pulmonary at this point.   Larey Dresser 07/29/2013 12:57 PM

## 2013-07-29 NOTE — Progress Notes (Signed)
Seen and examined.  Discussed with Dr. Lonny Prude.  Agree with his management and documentation.

## 2013-07-29 NOTE — Consult Note (Signed)
Name: Rachel [name] MRN: [id number] DOB: [birth year]    ADMISSION DATE:  07/28/2013 CONSULTATION DATE:  07/28/13  REFERRING MD :  Dr. Andria Frames / [facility]PTS PRIMARY SERVICE:  FPTS  CHIEF COMPLAINT:  Dyspnea / [place]  BRIEF PATIENT DESCRIPTION   53 y/o F with PMH of HTN, Anxiety, GERD, NSVT s/p pacemaker, O2 dependent sarcoidosis, & PAH (PA 75 in 2014 by ECHO) followed by Dr Baltazar Apo and Dr [name]. As of Jan and March 2015 she was on revatio and ventavis. She presented to [facility] ER on 4/22 with 3 wk hx of worsening dyspnea, fatigue. She says that about 3-4 weeks ago, she saw Dr. Lamonte Sakai and he changed her inhaler from Symbicort to Advair. She dates her subsequent decline to this time. Hwoever, she feels symbicort is the better mdi for her.  Patient reports she first noticed worsening fatigue requiring rest breaks for usual activities that did not require rest.  She thought she might have a cold and it would "get better".  At baseline, she is able to walk from her room to bathroom and kitchen without rest but as of last 3 weeks she could not without stopping for rest.  She has noted headaches with periods of low saturations, cough with yellow sputum production and chills/sweats.  Patient denies chest pain, pain with inspiration, n/v/d, abdominal pain & hemoptysis.  ER evaluation noted essentially normal BMP (glucose of 109), Hgb 10.8, BNP 466 and cxr with findings consistent with scarring but no acute infiltrate.  PCCM consulted for evaluation.     has a past medical history of GERD (gastroesophageal reflux disease); Hypertension; Sarcoidosis; CHF (congestive heart failure); Pulmonary HTN; Anxiety; Cardiomyopathy; NSVT (nonsustained ventricular tachycardia); Chronic chest pain; Allergic rhinitis; H/O: iron deficiency anemia; Secondary amenorrhea; Psychosis; SVT (supraventricular tachycardia); Hypokalemia; Rotator cuff sprain; and Chronic back pain.   has past surgical history that includes Cardiac  catheterization (08/26/2008); adenosine cardiolyte (03/31/2003); Cardiac catheterization (03/31/2003); defibrillator placed (03/31/2003); and PFTs.    SIGNIFICANT EVENTS / STUDIES:  4/22 - admit with worsening DOE, fatigue x 3 wks, 1 day of chills/sweats, mild increase in swelling     LINES / TUBES:  CULTURES: Sputum 4/22>>>  ANTIBIOTICS:  Anti-infectives   Start     Dose/Rate Route Frequency Ordered Stop   07/28/13 1400  levofloxacin (LEVAQUIN) IVPB 750 mg     750 mg 100 mL/hr over 90 Minutes Intravenous Every 24 hours 07/28/13 1209         SUBJECTIVE:  4/23 - no active complaints. Feels symbicort is better mdi for her VITAL SIGNS: Temp:  [97.8 F (36.6 C)-98.9 F (37.2 C)] 98.1 F (36.7 C) (04/23 0807) Pulse Rate:  [77-104] 77 (04/23 0348) Resp:  [13-30] 13 (04/23 0348) BP: (108-149)/(55-95) 114/95 mmHg (04/23 0348) SpO2:  [93 %-100 %] 99 % (04/23 1025) Weight:  [77.111 kg (170 lb)-79.6 kg (175 lb 7.8 oz)] 77.111 kg (170 lb) (04/23 1010)  PHYSICAL EXAMINATION: General:  Obese female in NAD Neuro:  AAOx4, speech clear, MAE HEENT:  Mm pink/moist, no jvd Cardiovascular:  s1s2 rrr, no m/r/g Lungs:  resp's even/non-labored, lungs bilaterally mildly coarse, no wheezes or crackles Abdomen: round/soft, bsx4 active Musculoskeletal:  No acute deformities  Skin:  1+ edema in BLE   PULMONARY No results found for this basename: PHART, PCO2, PCO2ART, PO2, PO2ART, HCO3, TCO2, O2SAT,  in the last 168 hours  CBC  Recent Labs Lab 07/28/13 0506 07/29/13 0340  HGB 10.8* 10.4*  HCT 34.9* 32.9*  WBC 8.3 6.5  PLT 296 270    COAGULATION No results found for this basename: INR,  in the last 168 hours  CARDIAC  No results found for this basename: TROPONINI,  in the last 168 hours  Recent Labs Lab 07/28/13 0513  PROBNP 466.6*     CHEMISTRY  Recent Labs Lab 07/28/13 0506 07/29/13 0340  NA 141 140  K 3.9 4.2  CL 101 99  CO2 28 28  GLUCOSE 109* 120*  BUN 13  16  CREATININE 0.66 0.76  CALCIUM 9.6 9.5   Estimated Creatinine Clearance: 80 ml/min (by C-G formula based on Cr of 0.76).   LIVER No results found for this basename: AST, ALT, ALKPHOS, BILITOT, PROT, ALBUMIN, INR,  in the last 168 hours   INFECTIOUS No results found for this basename: LATICACIDVEN, PROCALCITON,  in the last 168 hours   ENDOCRINE CBG (last 3)  No results found for this basename: GLUCAP,  in the last 72 hours       IMAGING x48h  Dg Chest 2 View  07/28/2013   CLINICAL DATA:  Shortness breath.  History of sarcoidosis.  EXAM: CHEST  2 VIEW  COMPARISON:  DG CHEST 2 VIEW dated 05/27/2013  FINDINGS: Cardiac pacemaker. Shallow inspiration. Mild cardiac enlargement without pulmonary vascular congestion. Diffuse coarse fibrosis throughout the lungs compatible with history of sarcoidosis. No developing consolidation is suggested. Increased density over the lung bases is likely due to soft tissue attenuation. No significant changes since previous study.  IMPRESSION: Diffuse coarse pulmonary fibrosis compatible with history of sarcoidosis. No acute changes since previous study.   Electronically Signed   By: Lucienne Capers M.D.   On: 07/28/2013 06:20       ASSESSMENT / PLAN:  Acute Dyspnea - ddx includes acute viral illness, progression of PAH/RH disease, acute sarcoid flare (less likely) Stage IV Pulmonary Sarcoidosis  Oxygen Dependent - 6 L baseline PAH - on revatio 80 mg TID & inhaled Iloprost.  RHC 8/12>PA 51/24 Combined CHF / RV Failure   - possibly better. TOugh situation with very little interventions to offer as she appears maxed on Rx  Plan: -assess ambulatory desaturations prior to discharge on 6L, this may be progression of disease and she may benefit from an oxymizer to deliver higher flow O2 -low dose steroids (44m QD), however, suspect given end stage sarcoid this will be of little benefit; will have to reassess taper on 07/30/13 -no benefit escalation  steroids -continue sildenafil & iloprost -diuresis per CHF SVC, likely this would be the most important therapy to attempt to improve back to her baseline -empiric levaquin, high risk gram neg res org, so if declines would covera pseudo -await ECHO for PA pressure escallation  -titrate oxygen for sats >92-94% with PAH, baseline 6L -duoneb Q6 + PRN Albuterol  -lasix, renal fxn appears to be able to handle this -if no improvement lasix, could consider CT repeat chest or MRI heart for progression of sarcoid further  GLOBAL: [id number] Dr FTitus Mould Extensive discussion with patient regarding goals of care given her underlying pulmonary disease and potential need for intubation if decompensation.  She is open to short term support with mechanical ventilation if she can be returned to current functional state.  She understands that she would likely have a difficult time weaning from mechanical ventilation.  She would not want long term support, trach or facility living.      Dr. MBrand Males M.D., FAvenir Behavioral Health CenterC.P Pulmonary and Critical Care Medicine Staff Physician  [place] Pager: [phone], If no answer or between  15:00h - 7:00h: call [phone]  07/29/2013 10:55 AM

## 2013-07-29 NOTE — Progress Notes (Signed)
Pt with several runs of SVT, reports that she "could feel like something was happening.  I thought my sats were dropping."  SPO2 97% on 4.5 L.  NSR 77 now, Pt resting comfortably in bed.  Emotional support given.  Strip saved in [place].

## 2013-07-29 NOTE — Progress Notes (Signed)
Utilization review completed

## 2013-07-30 ENCOUNTER — Telehealth: Payer: Self-pay | Admitting: Emergency Medicine

## 2013-07-30 ENCOUNTER — Telehealth: Payer: Self-pay | Admitting: Internal Medicine

## 2013-07-30 DIAGNOSIS — I509 Heart failure, unspecified: Secondary | ICD-10-CM

## 2013-07-30 MED ORDER — FUROSEMIDE 40 MG PO TABS: 40.0000 mg | ORAL_TABLET | ORAL | Status: DC

## 2013-07-30 MED ORDER — BUDESONIDE-FORMOTEROL FUMARATE 160-4.5 MCG/ACT IN AERO: 2.0000 | INHALATION_SPRAY | Freq: Two times a day (BID) | RESPIRATORY_TRACT | Status: DC

## 2013-07-30 MED ORDER — CARVEDILOL 6.25 MG PO TABS: 6.2500 mg | ORAL_TABLET | Freq: Two times a day (BID) | ORAL | Status: DC

## 2013-07-30 MED ORDER — LEVOFLOXACIN 750 MG PO TABS
750.0000 mg | ORAL_TABLET | Freq: Every day | ORAL | Status: DC
  Administered 2013-07-30: 750 mg via ORAL
  Filled 2013-07-30 (×2): qty 1

## 2013-07-30 MED ORDER — CARVEDILOL 6.25 MG PO TABS
6.2500 mg | ORAL_TABLET | Freq: Two times a day (BID) | ORAL | Status: DC
  Administered 2013-07-30 – 2013-07-31 (×2): 6.25 mg via ORAL
  Filled 2013-07-30 (×4): qty 1

## 2013-07-30 MED ORDER — PREDNISONE 10 MG PO TABS: 10.0000 mg | ORAL_TABLET | Freq: Every day | ORAL | Status: DC

## 2013-07-30 MED ORDER — FUROSEMIDE 40 MG PO TABS
40.0000 mg | ORAL_TABLET | ORAL | Status: DC
  Filled 2013-07-30: qty 1

## 2013-07-30 NOTE — Progress Notes (Signed)
Family Medicine Teaching Service Daily Progress Note Intern Pager: [phone]  Patient name: Rachel [name] Medical record number: [id number] Date of birth: 1960-11-13 Age: 53 y.o. Gender: female  Primary Care Provider: Dewain Penning, MD Consultants: Cardiology, Pulmonology Code Status: Full code  Pt Overview and Major Events to Date:  4/22: patient admitted  Assessment and Plan: DIANIA CO is a 52 y.o. female presenting with dyspnea. PMH is significant for CHF (combined systolic, diastolic and RV), GERD, HTN, Sarcoidosis, and Pulmonary HTN (on Home O2 [place] 4-5L).   # Acute dyspnea - multifactorial in nature given severe sarcoidosis, pulmonary hypertension, and heart failure. Chest x-ray revealed diffuse pulmonary fibrosis; no acute infiltrate. Pro BNP 466.   Continue Duoneb  Continue Dulera  Continue supplemental O2  Ambulatory O2 not done yesterday. Will follow-up today  Follow-up PT recommendations  Per cardiology, patient to get cardiac rehab  # SVT  Continue coreg  Cardiology planning possible event monitor; will follow-up  # CHF (combined systolic, diastolic and RV): Patient with ICD. Repeat echo shows EF 60%. PA pressure not obtained. Weight at admission: 170lbs. Weight today 169lbs. UOP: ~1.4L (0.36m/kg/hr)  Continuing home Coreg per cardiology  Strict in/out  Daily weight  Cardiology to consider event monitor  # Pulmonary HTN: last PA pressure of 414mg  Continue sildenafil  Continue home Iloprost  # Hx of Stage IV pulmonary sarcoidosis  Continue prednisone; per pulmonology may reassess today. Follow-up pulmonology before discharge  # GERD   Protonix daily  # HTN   Continuing home Coreg  FEN/GI: Heart healthy  Prophylaxis: Lovenox  Disposition: Possible discharge home today pending PT, Cards and Pulm recs  Subjective:  Patient feels much better today. No shortness of breath or chest pain. Did have an episode of SVT yesterday with the  feeling of "something wrong" which got her out of bed. She initially thought her O2 sats were down, but were 97% per RN note. Has never had something like that happen before.  Objective: Temp:  [97.7 F (36.5 C)-98.2 F (36.8 C)] 98.2 F (36.8 C) (04/24 0505) Pulse Rate:  [69-87] 69 (04/24 0505) Resp:  [18-27] 18 (04/24 0505) BP: (91-134)/(57-77) 115/65 mmHg (04/24 0505) SpO2:  [98 %-100 %] 99 % (04/24 0914) Weight:  [169 lb 1.6 oz (76.703 kg)-170 lb (77.111 kg)] 169 lb 1.6 oz (76.703 kg) (04/24 0505)  04/23 0701 - 04/24 0700 In: 540 [P.O.:240; IV Piggyback:300] Out: 130947Urine:1375]  Physical Exam: General: Laying in bed in no acute distress Cardiovascular: Regular rate/rhythm, no murmur Respiratory: Clear to auscultation bilaterally, coarse breath sounds, no wheezing Abdomen: Soft, non-tender, non-distended Extremities: Large calfs but at baseline. No tenderness, asymmetry, erythema or edema bilaterally.  Laboratory:  Recent Labs Lab 07/28/13 0506 07/29/13 0340  WBC 8.3 6.5  HGB 10.8* 10.4*  HCT 34.9* 32.9*  PLT 296 270    Recent Labs Lab 07/28/13 0506 07/29/13 0340  NA 141 140  K 3.9 4.2  CL 101 99  CO2 28 28  BUN 13 16  CREATININE 0.66 0.76  CALCIUM 9.6 9.5  GLUCOSE 109* 120*    Imaging/Diagnostic Tests:  No results found.  RaCordelia PocheMD 07/30/2013, 9:28 AM PGY-1, [place] pager: [phone] pages welcome

## 2013-07-30 NOTE — Progress Notes (Signed)
Pt amulated 100 ft with RW and O2 at 6L with SPO2 95-97%.  Complains only of leg weakness, more so on the left.  To side of bed after walk with call bell in reach.  Will con't plan of care.

## 2013-07-30 NOTE — Telephone Encounter (Signed)
Per pt rx sent to local pharm and needed to be sent to [facility]. This has been done and nothing further is needed

## 2013-07-30 NOTE — Progress Notes (Signed)
CARDIAC REHAB PHASE I   PRE:  Rate/Rhythm: 76 SR  BP:  Supine:   Sitting: 120/70  Standing:    SaO2: 96%4L  MODE:  Ambulation: 150 ft   POST:  Rate/Rhythm: 91 SR  BP:  Supine:   Sitting: 110/80  Standing:    SaO2: 88-89% 4L hall, 91 % 4L room 1355-1500 Pt walked 150 ft on 4L with gait belt use, rolling walker and asst x 1. Pt c/o left leg feeling weak. Stopped once to rest and sats at 88-89% and to 91% with rest. To bed after walk. Left on 4L. Gave CHF booklet and reviewed zones. Pt stated she does not weigh daily as she has problem seeing numbers on scale. Stated she needed digital scale. Discussed sodium restriction. Pt stated she had been eating a lot of spinach pizza lately. Pt stated she will consider Pulmonary Rehab depending on her recovery at home. Stated she tried it once and it was too much for her. Will ask Pulm Rehab to follow up. Rolling walker brought to room.    Graylon Good, RN BSN  07/30/2013 2:59 PM

## 2013-07-30 NOTE — Care Management Note (Addendum)
    Page 1 of 2   07/31/2013     5:08:05 PM CARE MANAGEMENT NOTE 07/31/2013  Patient:  Rachel [name], Rachel [name]   Account Number:  [id number]  Date Initiated:  07/30/2013  Documentation initiated by:  AMERSON,JULIE  Subjective/Objective Assessment:   Pt adm on 07/28/13 with SOB r/t sarcoid.  PTA, pt independent, lives with daughter.  Pt is on chronic oxygen, through [facility].     Action/Plan:   Pt for dc home today; needs HHPT and RW for home.  Referral to [facility] for [facility] and DME needs, per pt choice.  Start of care for [facility], 24-48h post dc date.   Anticipated DC Date:  07/30/2013   Anticipated DC Plan:  [place]  CM consult      [facility] Choice  HOME HEALTH   Choice offered to / List presented to:  C-1 Patient   DME arranged  Vassie Moselle      DME agency  [place] arranged  [place].   Status of service:  Completed, signed off Medicare Important Message given?   (If response is "NO", the following Medicare IM given date fields will be blank) Date Medicare IM given:   Date Additional Medicare IM given:    Discharge Disposition:  [place]  Per UR Regulation:  Reviewed for med. necessity/level of care/duration of stay  If discussed at [place] of Stay Meetings, dates discussed:    Comments:  07/31/13 09:00 Cm spoke with pt who's portable mini O2 tank is empty and she is requesting we fill it.  CM arranged with [facility] to bring her a portable tank so she can get home. Pt also stating her oxygen concentrator is malfunctioning but was not home when [facility] cam to fix it.  Pt verbally understands she is to call [facility] when she arrives home for them to arrange a time to address the concentrator.  Pt already set up for HHPT with [facility].  [place] notified of discharge.  No other needs were communicated.  Mariane Masters, BSN, CM [phone].

## 2013-07-30 NOTE — Evaluation (Signed)
Physical Therapy Evaluation Patient Details Name: Rachel [name] MRN: [id number] DOB: March 11, 1970 Today's Date: 07/31/2014   History of Present Illness  Pt admit with dyspnea.  Clinical Impression  Pt admitted with above. Pt currently with functional limitations due to balance and endurance deficits. Pt with use of home O2 PTA.  PT stayed >95% on 4LO2 with ambulation.  Pt is safe with RW and PT recommends HHPT.  Pt feels she needs "a few days of walking here prior to going home."  Encouraged pt that she will be fine to go home with rW and HHPT f/u.   Pt will benefit from skilled PT to increase their independence and safety with mobility to allow discharge to the venue listed below.     Follow Up Recommendations Home health PT;Supervision - Intermittent    Equipment Recommendations  Rolling walker with 5" wheels    Recommendations for Other Services       Precautions / Restrictions Precautions Precautions: None Restrictions Weight Bearing Restrictions: No      Mobility  Bed Mobility Overal bed mobility: Independent                Transfers Overall transfer level: Independent                  Ambulation/Gait Ambulation/Gait assistance: Modified independent (Device/Increase time) Ambulation Distance (Feet): 200 Feet Assistive device: Rolling walker (2 wheeled) Gait Pattern/deviations: Step-through pattern;Decreased stride length   Gait velocity interpretation: Below normal speed for age/gender General Gait Details: Pt ambulates slowly and deliberately.  Pt with occasional staggering which she states is knee instability however pt able to function well with RW.  In testing, pt with some weakness but not significant.    Stairs            Wheelchair Mobility    Modified Rankin (Stroke Patients Only)       Balance Overall balance assessment: Needs assistance;History of Falls         Standing balance support: Bilateral upper extremity supported;During  functional activity Standing balance-Leahy Scale: Fair Standing balance comment: Pt functions well with RW.  Feel she will benefit from use of RW at home.               High level balance activites: Turns;Direction changes;Sudden stops High Level Balance Comments: supervision with RW with high level activities             Pertinent Vitals/Pain O2 sats on 4L>95%.  Other VSS.  No pain.    Home Living Family/patient expects to be discharged to:: Private residence Living Arrangements: Children;Other relatives (daughter and 2 grandkids, 90 and 78 years old) Available Help at Discharge: Family;Available PRN/intermittently (daughter at home but pregnant) Type of Home: Apartment Home Access: Level entry     Home Layout: One level Home Equipment: Other (comment);Cane - single point (home O2)      Prior Function Level of Independence: Independent with assistive device(s)         Comments: used cane when legs are weak.     Hand Dominance   Dominant Hand: Right    Extremity/Trunk Assessment   Upper Extremity Assessment: Defer to OT evaluation           Lower Extremity Assessment: Generalized weakness      Cervical / Trunk Assessment: Normal  Communication   Communication: No difficulties  Cognition Arousal/Alertness: Awake/alert Behavior During Therapy: WFL for tasks assessed/performed Overall Cognitive Status: Within Functional Limits for tasks assessed  General Comments      Exercises        Assessment/Plan    PT Assessment Patient needs continued PT services  PT Diagnosis Generalized weakness   PT Problem List Decreased activity tolerance;Decreased balance;Decreased mobility;Decreased knowledge of precautions;Decreased safety awareness;Decreased knowledge of use of DME  PT Treatment Interventions DME instruction;Gait training;Functional mobility training;Therapeutic activities;Therapeutic exercise;Balance  training;Patient/family education   PT Goals (Current goals can be found in the Care Plan section) Acute Rehab PT Goals Patient Stated Goal: to go home PT Goal Formulation: With patient Time For Goal Achievement: 08/06/13 Potential to Achieve Goals: Good    Frequency Min 3X/week   Barriers to discharge Decreased caregiver support      Co-evaluation               End of Session Equipment Utilized During Treatment: Gait belt;Oxygen Activity Tolerance: Patient limited by fatigue Patient left: in bed;with call bell/phone within reach Nurse Communication: Mobility status         Time: 3428-7681 PT Time Calculation (min): 16 min   Charges:   PT Evaluation $Initial PT Evaluation Tier I: 1 Procedure PT Treatments $Gait Training: 8-22 mins   PT G Codes:          Christianne Dolin 08-27-14, 10:21 AM [facility] [phone] (pager)

## 2013-07-30 NOTE — Telephone Encounter (Signed)
Error.  Wrong doc.  Satira Anis

## 2013-07-30 NOTE — Progress Notes (Signed)
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 She had been quite stable for a couple of years until the last few weeks. She says that about 3-4 weeks ago, she saw Dr. Lamonte Sakai and he changed her inhaler from Symbicort to Advair. She dates her subsequent decline to this time. Since then, she has had the gradual onset of increased exertional dyspnea, progressing to dyspnea at rest over the last day. She has had a cough productive of yellow sputum for 2-3 weeks now. Last night, she had symptoms consistent with orthopnea. She has not taken her temperature but has been diaphoretic on occasion and feels like she has had a fever. WBCs not elevated here. CXR consistent with chronic sarcoidosis. She has not gained weight. She was placed on 100% NRBM.   She was placed on steroids, antibiotics, and IV lasix.  Oxygen weaned to 4 liters. Yesterday diuretics stopped. Short run SVT yesterday with dyspnea associated. O2 sats stable at the time. Weight down 1 pound.     Breathing at baseline. Mild dizziness.    ECHO 07/29/13 EF 60% RV normal.   Objective:   Weight Range:  Vital Signs:   Temp:  [97.7 F (36.5 C)-98.2 F (36.8 C)] 98.2 F (36.8 C) (04/24 0505) Pulse Rate:  [69-87] 69 (04/24 0505) Resp:  [18-27] 18 (04/24 0505) BP: (91-134)/(57-77) 115/65 mmHg (04/24 0505) SpO2:  [98 %-100 %] 100 % (04/24 0505) Weight:  [169 lb 1.6 oz (76.703 kg)-170 lb (77.111 kg)] 169 lb 1.6 oz (76.703 kg) (04/24 0505) Last BM Date: 07/27/13  Weight change: Filed Weights   07/29/13 0443 07/29/13 1010 07/30/13 0505  Weight: 175 lb 0.7 oz (79.4 kg) 170 lb (77.111 kg) 169 lb 1.6 oz (76.703 kg)    Intake/Output:   Intake/Output Summary (Last 24 hours) at 07/30/13 0807 Last data filed at 07/30/13 0500  Gross per 24 hour  Intake    540 ml  Output   1375 ml  Net    -835 ml     Physical Exam: General: NAD Sitting in chair.   Neck: JVP 5-6 cm, no thyromegaly or thyroid nodule.  Lungs: Clear on 4 liters [place].   CV: Nondisplaced PMI. Heart regular S1/S2, no S3/S4, no murmur. No peripheral edema. No carotid bruit. Normal pedal pulses.  Abdomen: Soft, nontender, no hepatosplenomegaly, no distention.  Skin: Intact without lesions or rashes.  Neurologic: Alert and oriented x 3.  Psych: Normal affect.  Extremities: No clubbing or cyanosis.  HEENT: Normal.    Telemetry: SR 70s    Labs: Basic Metabolic Panel:  Recent Labs Lab 07/28/13 0506 07/29/13 0340  NA 141 140  K 3.9 4.2  CL 101 99  CO2 28 28  GLUCOSE 109* 120*  BUN 13 16  CREATININE 0.66 0.76  CALCIUM 9.6 9.5    Liver Function Tests: No results found for this basename: AST, ALT, ALKPHOS, BILITOT, PROT, ALBUMIN,  in the last 168 hours No results found for this basename: LIPASE, AMYLASE,  in the last 168 hours No results found for this basename: AMMONIA,  in the last 168 hours  CBC:  Recent Labs Lab 07/28/13 0506 07/29/13 0340  WBC 8.3 6.5  NEUTROABS 5.9  --   HGB 10.8* 10.4*  HCT 34.9* 32.9*  MCV 90.9 89.4  PLT 296 270    Cardiac Enzymes: No results found  for this basename: CKTOTAL, CKMB, CKMBINDEX, TROPONINI,  in the last 168 hours  BNP: BNP (last 3 results)  Recent Labs  01/04/13 1437 07/28/13 0513  PROBNP 22.0 466.6*     Other results:     Imaging: No results found.   Medications:     Scheduled Medications: . aspirin EC  81 mg Oral Daily  . budesonide-formoterol  2 puff Inhalation BID  . carvedilol  3.125 mg Oral BID WC  . enoxaparin (LOVENOX) injection  40 mg Subcutaneous Q24H  . [START ON 07/31/2013] furosemide  40 mg Oral QODAY  . Iloprost  1 mL Inhalation 6 times per day  . ipratropium-albuterol  3 mL Nebulization Q6H  . levofloxacin (LEVAQUIN) IV  750 mg Intravenous Q24H  . pantoprazole  40 mg Oral Daily  . predniSONE  10 mg Oral Q  breakfast  . risperiDONE  0.5 mg Oral QHS  . sildenafil  80 mg Oral TID  . 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  1. Dyspnea/acute respiratory failure: Patient has baseline Stage IV sarcoidosis as well as pulmonary arterial HTN with RV failure. Possible infection, perhaps viral, superimposed on her baseline severe sarcoidosis. Uncertain if this would be a sarcoid flare as her sarcoid has seemed inactive for a number of years (just has chronic scarring).  Pro-BNP was not particularly elevated (pro-BNP done in hospital, BNP in office, and pro-BNP tends to run higher in general).  - Continue  low dose steroids and with antibiotics.  - Volume status low.  Restart lasix 40 mg every other day on Sunday   Renal function stable.  2. Pulmonary arterial hypertension: She has been very stable on Iloprost and Revatio at home for several years. Last echo in 10/14 showed a mildly dilated and mildly dysfunctional RV with PA systolic pressure 44 mmHg. 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  - Would continue home meds => she apparently will need to bring in [place] from home. Continue sildenafil 80 mg tid.  - ECHO EF 60% RV normal   - Received 2 doses IV lasix.  Diuretics stopped yesterday. Can restart 40 mg po lasix on Saturday.   3. Cardiomyopathy: LV EF 60% and RV normal. Continue home Coreg.  4. SVT- short run yesterday. Continue carvedilol 3.125 mg twice a day. May need to place event monitor.   Have arranged follow up in HF clinic 08/05/13 at 10:20 with Dr Lourdes Sledge of Stay: [place] NP-C  07/30/2013, 8:07 AM  Advanced Heart  Failure Team Pager [phone] (M-F; 7a - 4p)  Please contact [facility] for night-coverage after hours (4p -7a ) and weekends on [email or web address]  Patient seen with NP, agree with the above note.  Doing better, hopefully home today or tomorrow.  SVT run yesterday, can increase Coreg to 6.25 mg bid.  Restart Lasix tomorrow at 40 mg every other day.  Followup CHF clinic next week.  Larey Dresser 07/30/2013 11:59 AM

## 2013-07-30 NOTE — Discharge Instructions (Signed)
Rachel [name], you were admitted because of increased shortness of breath. We were especially worried because of your underlying lung disease. Cardiology and pulmonology also saw you. You seem to have greatly improved. We are discharging you home with some steroids. Please follow-up with Dr. Maricela Bo, Dr. Aundra Dubin and Dr. Lamonte Sakai. We are also discharging you with a rolling walker and home physical therapy. Please continue your home oxygen as before.

## 2013-07-30 NOTE — Discharge Summary (Signed)
[facility] Discharge Summary  Patient name: Rachel [name] Medical record number: [id number] Date of birth: 09-07-1960 Age: 53 y.o. Gender: female Date of Admission: 07/28/2013  Date of Discharge: 07/31/2013 Admitting Physician: Zigmund Gottron, MD  Primary Care Provider: Dewain Penning, MD Consultants: Pulmonology, Heart Failure  Indication for Hospitalization: Shortness of breath  Discharge Diagnoses/Problem List:  1. Dyspnea 2. Pulmonary hypertension 3. Supraventricular tachycardia 4. CHF 5. Stage IV Sarcoidosis 6. GERD 7. Hypertension 8. Mood disorder  Disposition: Discharge home with home health physical therapy  Discharge Condition: Stable  Discharge Exam:  General: Laying in bed in no acute distress  Cardiovascular: Regular rate/rhythm, no murmur  Respiratory: Clear to auscultation bilaterally, coarse breath sounds, no wheezing  Abdomen: Soft, non-tender, non-distended  Extremities: Large calfs but at baseline. No tenderness, asymmetry, erythema or edema bilaterally.  Brief Hospital Course:   HPI Rachel [name] is a 53 y.o. female with a complex PMH including CHF (combined systolic, diastolic and RV), GERD, HTN, Stage IV Sarcoidosis, and Pulmonary HTN (on Home O2 [place] 4-6 L) who presents with progressive dyspnea.  Patient reports that for the past 3 weeks she had gradual increasing SOB. This started after she was taken off Symbicort due to affordability. She reports associated productive cough, subjective fever, and chills. She has some chest discomfort with the cough and it is reproducible with palpation. Her symptoms worsened acutely last night, forcing her to come in for evaluation.  ROS: Per HPI with the following additions: No PND or orthopnea. No increasing edema.   Dyspnea: patient received x-ray which did not show pulmonary edema. Pro-BNP slightly elevated. Patient did not appear significant fluid overloaded. Had an increased O2  requirement of 6L from baseline of 4-5L. Patient diuresed using IV lasix x2 by cardiology with good response. Levaquin started for possible infection. Patient's dyspnea improved with O2 requirement back to baseline. Patient resumed on home regimen of lasix. Physical therapy walked with patient and recommended home health physical therapy. Patient ambulated well on home O2 requirement and prescribed rolling walker before discharge. Patient to continue 5 day course of antibiotic.  Pulmonary hypertension: continued home sildenafil and Iloprost.  Supraventricular tachycardia: new finding while patient was on telemetry. Patient felt something was wrong but no real symptoms. Cardiology recommended increasing to coreg 6.99m with follow-up outpatient  CHF: weight down 1lb from admission. Patient had net output of 2.3L during admission. Management listed in "Dyspnea" problem.  Stage IV Sarcoidosis: Started prednisone 136m Will discharge home on prednisone to finish a 7 day course.  GERD: pantaprazole daily.  Hypertension: initially continued patient's home regimen of coreg 3.125, but after episode of SVT, increased to 6.25 BID  Mood disorder: continued patient's risperidone at night   Issues for Follow Up:  1. Patient has weakness in legs, follow-up physical therapy 2. Obtain dry weight if not already obtained  Significant Procedures: 2D echocardiogram  Significant Labs and Imaging:   Recent Labs Lab 07/28/13 0506 07/29/13 0340  WBC 8.3 6.5  HGB 10.8* 10.4*  HCT 34.9* 32.9*  PLT 296 270    Recent Labs Lab 07/28/13 0506 07/29/13 0340  NA 141 140  K 3.9 4.2  CL 101 99  CO2 28 28  GLUCOSE 109* 120*  BUN 13 16  CREATININE 0.66 0.76  CALCIUM 9.6 9.5   Dg Chest 2 View  07/28/2013   CLINICAL DATA:  Shortness breath.  History of sarcoidosis.  EXAM: CHEST  2 VIEW  COMPARISON:  DG  CHEST 2 VIEW dated 05/27/2013  FINDINGS: Cardiac pacemaker. Shallow inspiration. Mild cardiac enlargement  without pulmonary vascular congestion. Diffuse coarse fibrosis throughout the lungs compatible with history of sarcoidosis. No developing consolidation is suggested. Increased density over the lung bases is likely due to soft tissue attenuation. No significant changes since previous study.  IMPRESSION: Diffuse coarse pulmonary fibrosis compatible with history of sarcoidosis. No acute changes since previous study.   Electronically Signed   By: Lucienne Capers M.D.   On: 07/28/2013 06:20   Echocardiogram (4/22) Study Conclusions  - Left ventricle: The cavity size was normal. Wall thickness was increased in a pattern of mild LVH. The estimated ejection fraction was 60%. Wall motion was normal; there were no regional wall motion abnormalities. Doppler parameters are consistent with abnormal left ventricular relaxation (grade 1 diastolic dysfunction). - Mitral valve: Flat closure of mitral valve. Trivial regurgitation. - Right ventricle: The cavity size was normal. Pacer wire or catheter noted in right ventricle. Systolic function was normal. - Right atrium: The atrium was mildly dilated.   Results/Tests Pending at Time of Discharge: None  Discharge Medications:    Medication List         acetaminophen 325 MG tablet  Commonly known as:  TYLENOL  Take 650 mg by mouth as needed.     albuterol 108 (90 BASE) MCG/ACT inhaler  Commonly known as:  PROAIR HFA  Inhale 2 puffs into the lungs 4 (four) times daily.     aspirin 81 MG tablet  Take 81 mg by mouth daily.     budesonide-formoterol 160-4.5 MCG/ACT inhaler  Commonly known as:  SYMBICORT  Inhale 2 puffs into the lungs 2 (two) times daily.     carvedilol 6.25 MG tablet  Commonly known as:  COREG  Take 1 tablet (6.25 mg total) by mouth 2 (two) times daily with a meal.     esomeprazole 40 MG capsule  Commonly known as:  NEXIUM  Take 1 capsule (40 mg total) by mouth daily before breakfast.     furosemide 40 MG tablet  Commonly known  as:  LASIX  Take 1 tablet (40 mg total) by mouth every other day.     Iloprost 20 MCG/ML Soln  Inhale into the lungs. Ventavis-- 6 times daily - Inhalation     levofloxacin 750 MG tablet  Commonly known as:  LEVAQUIN  Take 1 tablet (750 mg total) by mouth daily.     potassium chloride SA 20 MEQ tablet  Commonly known as:  K-DUR,KLOR-CON  2 tabs am and 1 tab po qhs     predniSONE 10 MG tablet  Commonly known as:  DELTASONE  Take 1 tablet (10 mg total) by mouth daily with breakfast.     risperiDONE 0.5 MG tablet  Commonly known as:  RISPERDAL  Take 1 tablet (0.5 mg total) by mouth at bedtime.     sildenafil 20 MG tablet  Commonly known as:  REVATIO  take 4  tablets every 8 hours     Spacer/Aero Chamber Mouthpiece Misc  Use as directed        Discharge Instructions: Please refer to Patient Instructions section of EMR for full details.  Patient was counseled important signs and symptoms that should prompt return to medical care, changes in medications, dietary instructions, activity restrictions, and follow up appointments.   Follow-Up Appointments: Follow-up Information   Follow up with [name], MD On 08/05/2013. (10:20 Garage Code [phone])    Specialty:  Cardiology  Contact information:   [address]  [place] [address] [phone]       Follow up with Collene Gobble., MD On 08/24/2013. (2:00PM, For hospital follow-up)    Specialty:  Pulmonary Disease   Contact information:   [address] [place] [address] [phone]       Follow up with Dewain Penning, MD On 08/12/2013. (10:30AM, For hospital follow-up)    Specialty:  Family Medicine   Contact information:   [place] [address] [phone]       Cordelia Poche, MD 07/31/2013, 4:04 PM PGY-1, [place]

## 2013-07-30 NOTE — Progress Notes (Signed)
Seen and examined.  Discussed with Dr. Lonny Prude.  Agree with his management and documentation.  Rachel [name] is back to her respiratory baseline.  She is a possible DC today.  My only concern is that she would be home alone tonight and PT says she needs intermediate supervision.  She will discuss with family.  Possible DC this afternoon - if not, tomorrow morning.

## 2013-07-31 MED ORDER — LEVOFLOXACIN 750 MG PO TABS: 750.0000 mg | ORAL_TABLET | Freq: Every day | ORAL | Status: DC

## 2013-07-31 NOTE — Progress Notes (Signed)
CARDIAC REHAB PHASE I   PRE:  Rate/Rhythm: 93 sinus  BP:  Sitting: 126/62     SaO2: 96 4L  MODE:  Ambulation: 150 ft 94% 4L   POST:  Rate/Rhythem: 103   BP:  Sitting: 128/62   SaO2: 97 4L  Pt ambulated 150 ft with assist x1.  Pt took two standing rest breaks to catch her breath.  Tolerated walk well.  Upon return, helped pt set up take home walker to her height.  Also encouraged pt to look into getting a rollator for home use.  Pt returned to bedside for bath after walk.  Pt had no f/u questions from education.  Encouraged pt to consider pulmonary rehab as it would benefit her greatly.  [name], MA, ACSM RCEP [phone]  Rachel [name]

## 2013-07-31 NOTE — Progress Notes (Signed)
Subjective:  Mild dyspnea with exertion, ready to go home and getting ready to walk and [place].  Objective:  Vital Signs in the last 24 hours: BP 107/67  Pulse 77  Temp(Src) 98.4 F (36.9 C) (Oral)  Resp 19  Ht _0  (1.6 m)  Wt 76.658 kg (169 lb)  BMI 29.94 kg/m2  SpO2 99%  Physical Exam: pleasant black female in no acute distress wearing oxygen Lungs:  Clear  Cardiac:  Regular rhythm, normal S1 and S2, no S3 Extremities:  No edema present  Intake/Output from previous day: 04/24 0701 - 04/25 0700 In: 603 [P.O.:600; I.V.:3] Out: 500 [Urine:500] Weight Filed Weights   07/29/13 1010 07/30/13 0505 07/31/13 0539  Weight: 77.111 kg (170 lb) 76.703 kg (169 lb 1.6 oz) 76.658 kg (169 lb)    Lab Results: Basic Metabolic Panel:  Recent Labs  07/29/13 0340  NA 140  K 4.2  CL 99  CO2 28  GLUCOSE 120*  BUN 16  CREATININE 0.76    CBC:  Recent Labs  07/29/13 0340  WBC 6.5  HGB 10.4*  HCT 32.9*  MCV 89.4  PLT 270    BNP    Component Value Date/Time   PROBNP 466.6* 07/28/2013 0513   Telemetry: Sinus rhythm with some sinus tachycardia  Assessment/Plan:   1. Dyspnea of uncertain etiology possibly infection or due to underlying sarcoidosis 2. Pulmonary hypertension currently stable on her previous medicines 3. Supraventricular tachycardia  Recommendations:  Okay to discharge today on carvedilol 6.25 twice a day. Continue Lasix every other day and followup in clinic next week.   Kerry Hough  MD [facility]  07/31/2013, 9:31 AM

## 2013-07-31 NOTE — Discharge Summary (Signed)
Attending Addendum  I examined the patient and discussed the discharge plan with Dr. Lonny Prude. I have reviewed the note and agree.    Boykin Nearing, [place]

## 2013-07-31 NOTE — Progress Notes (Signed)
Pt discharged per MD order and protocol. Discharge instructions reviewed with pt and all questions answered. Pt aware of follow up appointments.

## 2013-08-04 ENCOUNTER — Telehealth: Payer: Self-pay | Admitting: Cardiology

## 2013-08-04 ENCOUNTER — Inpatient Hospital Stay: Payer: Self-pay | Admitting: Family Medicine

## 2013-08-04 NOTE — Telephone Encounter (Signed)
Pt has appt 4/30 will address then

## 2013-08-04 NOTE — Telephone Encounter (Signed)
New Message  Verbal order for a home health nurse and Rolator walker( the Rolator Gilford Rile has to be a written script)// Please call back

## 2013-08-05 ENCOUNTER — Ambulatory Visit (HOSPITAL_COMMUNITY)
Admission: RE | Admit: 2013-08-05 | Discharge: 2013-08-05 | Disposition: A | Discharge status: Home / Self Care | Payer: Medicare Other | Source: Ambulatory Visit | Attending: Cardiology | Admitting: Cardiology

## 2013-08-05 VITALS — BP 124/72 | HR 73 | Wt 176.5 lb

## 2013-08-05 DIAGNOSIS — I5022 Chronic systolic (congestive) heart failure: Secondary | ICD-10-CM | POA: Insufficient documentation

## 2013-08-05 DIAGNOSIS — I471 Supraventricular tachycardia: Secondary | ICD-10-CM

## 2013-08-05 DIAGNOSIS — I498 Other specified cardiac arrhythmias: Secondary | ICD-10-CM

## 2013-08-05 DIAGNOSIS — I2789 Other specified pulmonary heart diseases: Secondary | ICD-10-CM | POA: Insufficient documentation

## 2013-08-05 MED ORDER — POTASSIUM CHLORIDE CRYS ER 20 MEQ PO TBCR: 40.0000 meq | EXTENDED_RELEASE_TABLET | ORAL | Status: DC

## 2013-08-05 NOTE — Progress Notes (Signed)
Patient ID: Rachel [name], female   DOB: May 23, 1960, 53 y.o.   MRN: [id number] PCP: Dr. 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 She is now on Revatio 80 mg tid and and inhaled Iloprost.  She has been evaluated for heart/lung transplant at [facility] as an inpatient but needed to raise around $10,000 to continue evaluation and has decided against this for now. She has been seeing Dr. Lamonte Sakai for her sarcoid.     Since last visit, she was admitted in 4/15 with acute bronchitis and possibly some CHF.  She was given antibiotics, IV Lasix, and prednisone for possible component of sarcoid flare.  Echo 4/15 showed EF 60%, normal RV size and systolic function, unable to estimate PA pressure.  She is back home now.  She feels better overall though her legs still feel week.  She finishes prednisone today.  She is not getting out much but can walk around the house without dyspnea.  She did her 6 min walk today but was very weak and did not get as far as usual. She really wants to try to get off Iloprost.  Says that she has "no life" because she is always preparing her inhaler.    6 minute walk today: 123 m.      Labs (11/10): BNP 91, creatinine 1.0, K 4  Labs (2/11): K 4.6, creatinine 0.86  Labs (3/11): BNP 58, K 4.2, creatinine 0.8  Labs (10/11): K 4.5, creatinine 0.8, BNP 76  Labs (7/13): LDL 102, K 4.1, creatinine 0.85 Labs (4/15): K 4.2, creatinine 0.76, BNP 467  ECG: NSR, nonspecific T wave changes  Allergies (verified):  1) ! * Steroids High Dose  2) Demerol  3) Ultram  4) Darvocet-N 100  5) Doxycycline   Past Medical History:  1. Sarcoidosis: Stage IV pulmonary sarcoid on home O2.  Has been quiescent recently.  She has been referred to [facility] for lung transplant evaluation.  2. Cardiomyopathy: Cardiac MRI in 2004 with no evidence for infiltrative disease but EF 40%. Echo (9/10): EF 60%, severely dilated RV with  moderately decreased systolic function, moderate RAE, PASP 83 mmHg.  Echo (1/12): EF 54%, grade I diastolic dysfunction, moderate biatrial enlargement, moderate RV dilation with normal RV systolic function, peak RV-RA gradient 34 mmHg.  Echo (8/13): EF 55-60%, mildly dilated RV with mild systolic dysfunction, PA systolic pressure 45 mmHg.  3. Pulmonary hypertension with RV failure: Moderate to severe, likely secondary to sarcoidosis and parenchymal lung disease/hypoxic pulmonary vasoconstriction. RHC 5/10 showed PA 67/30 (mean 46) with mean PCWP 6 mmHg. Patient started on Revatio in 5/10 without any change in oxygen saturation. 6 minute walk 7/10: 61 m. Echo (9/10): EF 60%, severely dilated RV with moderately decreased systolic function, moderate RAE, PASP 83 mmHg. RHC (9/10) with mean RA 15, PA 74/36, CI 2.8. Repeat RHC after diuresis with mean RA 3, PA 60/22, mean PCWP 4. Echo (1/11): EF 56-25%, grade I diastolic dysfunction, moderately dilated RV, mild to moderate RV dysfunction, moderate to severe TR, PASP 58 mmHg. 6 minute walk (2/11): 122 m. 6 min walk (6/11): 161.5 m. RHC (6/11): mean RA 11, RV 64/15, PA 63/28 mean 43, mean PCWP 14, CI 2.4 thermodilution and 3.2 Fick, PVR 5.3 WU Fick and 7.25 WU thermodilution. Iloprost begun. 6 min walk (10/11): 158 m. 6 min walk (1/12) 273 m.  6 min walk (7/12) 248 m.  RHC (8/12): mean  RA 2, PA 51/24, mean PA 35, mean PCWP 6, CI 3, PVR 5.1 WU.  PASP 45 mmHg on 8/13 echo.  6 minute walk (4/14): 223 m. 6 minute walk (9/14) 299 m. 6 min walk (4/15) 123 m 4. 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 Device is now nearing ERI. Given end stage lung disease and normalization of LV function, the device will not be replaced and tachy therapies were turned off.  5. GERD  6. Chronic chest pain. LHC (5/10) with no angiographic coronary disease.  7. Allergic rhinitis,  8. h/o iron deficiency anemia.  9. H/o secondary amenorrhea/irregular menses,  10. Psychosis with high dose  steroids.  11. SVT  12. Recurrent Hypokalemia  13. h/o Rotator cuff sprain  14. 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   ROS: All systems reviewed and negative except as per HPI.    Current Outpatient Prescriptions  Medication Sig Dispense Refill  . albuterol (PROAIR HFA) 108 (90 BASE) MCG/ACT inhaler Inhale 2 puffs into the lungs 4 (four) times daily.  8.5 g  11  . aspirin 81 MG tablet Take 81 mg by mouth daily.        . budesonide-formoterol (SYMBICORT) 160-4.5 MCG/ACT inhaler Inhale 2 puffs into the lungs 2 (two) times daily.  1 Inhaler  6  . carvedilol (COREG) 6.25 MG tablet Take 1 tablet (6.25 mg total) by mouth 2 (two) times daily with a meal.  60 tablet  0  . esomeprazole (NEXIUM) 40 MG capsule Take 1 capsule (40 mg total) by mouth daily before breakfast.  90 capsule  3  . furosemide (LASIX) 40 MG tablet Take 1 tablet (40 mg total) by mouth every other day.  45 tablet  3  . Iloprost 20 MCG/ML SOLN Inhale into the lungs. Ventavis-- 6 times daily - Inhalation  45 mL  2  . potassium chloride SA (K-DUR,KLOR-CON) 20 MEQ tablet Take 2 tablets (40 mEq total) by mouth every other day. When you take Lasix  270 tablet  2  . predniSONE (DELTASONE) 10 MG tablet Take 1 tablet (10 mg total) by mouth daily with breakfast.  5 tablet  0  . risperiDONE (RISPERDAL) 0.5 MG tablet Take 1 tablet (0.5 mg total) by mouth at bedtime.  90 tablet  3  . sildenafil (REVATIO) 20 MG tablet take 4  tablets every 8 hours      . Spacer/Aero Chamber [employer] Use as directed  1 each  0  . acetaminophen  (TYLENOL) 325 MG tablet Take 650 mg by mouth as needed.         No current facility-administered medications for this encounter.    BP 124/72  Pulse 73  Wt 176 lb 8 oz (80.06 kg)  SpO2 95% General: Well-developed,well-nourished,in no acute distress; O2 applied via nasal cannula  Neck: Neck supple, JVP 7 cm. No masses, thyromegaly or abnormal cervical nodes.  Lungs: Clear bilaterally to auscultation and percussion.  Heart: Non-displaced PMI, chest non-tender; regular rate and rhythm, S1, S2 without rubs or gallops. 1/6 systolic murmur LLSB. Loud P2.  Carotid upstroke normal, no bruit. Pedals normal pulses. Trace ankle edema bilaterally. Abdomen: Bowel sounds positive; abdomen soft and non-tender without masses, organomegaly, or hernias noted. No hepatosplenomegaly.  Extremities: No clubbing or cyanosis.  Neurologic: Alert and oriented x 3.  Psych: Normal affect.  Assessment/Plan:  PULMONARY HYPERTENSION  She has sarcoidosis but has pulmonary hypertension out of proportion to sarcoidosis (suspect WHO group 1 component).  She is currently on Iloprost and Revatio.  Last right heart cath in 8/12 showed improved PA pressure and mildly improved cardiac index on Iloprost. PA pressure and PVR was still high, however. Last echo in 4/15 showed normal LV EF and normal RV size and systolic function.  Unable to estimate PA systolic pressure.  6 minute walk was worse today, but she is still weak from recent hospitalization.  Volume status looks ok at this point.  - Continue current Lasix, can change KCl to 40 every other day. - She is having a lot of trouble with 6 times a day Iloprost administration though it does help her symptoms.  I am going to look into Tyvaso again for her. - Followup in 2 months. SVT Patient had brief episode in hospital.  Will continue Coreg.    Larey Dresser 08/05/2013 5:48 PM

## 2013-08-05 NOTE — Progress Notes (Signed)
6 min walk test preformed, pt ambulated 400 ft, on 6 L O2 via [place] sats at start were 93%, they did drop to 83% at 1 time and pt stopped walking to rest sat back up to 93% and pt continued walking, at end of 6 min sat 94% HR 94

## 2013-08-05 NOTE — Patient Instructions (Signed)
I will contact [facility] to change your Ventavis to Tyvaso  Decrease Potassium to 40 meq (2 tabs) every other day, take when you take Furosemide (Lasix)  Your physician recommends that you schedule a follow-up appointment in: 2 months

## 2013-08-10 NOTE — Telephone Encounter (Signed)
PT with [facility] called again to get RX for Rolator walker, Pt currently is using basic walker and PT feels pt could really benefit from walker with seat,  pt could rest wherever she is standing and could also be used to store O2 tank. Pt will need RX by the end of the before the "exchange period" expires with [facility].  rx sent to [facility]- pt scheduled for f/u 09/2013

## 2013-08-12 ENCOUNTER — Encounter: Payer: Self-pay | Admitting: Family Medicine

## 2013-08-12 ENCOUNTER — Ambulatory Visit (INDEPENDENT_AMBULATORY_CARE_PROVIDER_SITE_OTHER): Payer: Medicare Other | Admitting: Family Medicine

## 2013-08-12 VITALS — BP 154/92 | HR 77 | Ht 63.0 in | Wt 173.0 lb

## 2013-08-12 DIAGNOSIS — R634 Abnormal weight loss: Secondary | ICD-10-CM

## 2013-08-12 DIAGNOSIS — I509 Heart failure, unspecified: Secondary | ICD-10-CM

## 2013-08-12 DIAGNOSIS — D869 Sarcoidosis, unspecified: Secondary | ICD-10-CM

## 2013-08-12 DIAGNOSIS — I5022 Chronic systolic (congestive) heart failure: Secondary | ICD-10-CM

## 2013-08-12 LAB — COMPREHENSIVE METABOLIC PANEL
ALBUMIN: 4.3 g/dL (ref 3.5–5.2)
ALT: 9 U/L (ref 0–35)
AST: 12 U/L (ref 0–37)
Alkaline Phosphatase: 42 U/L (ref 39–117)
BILIRUBIN TOTAL: 0.5 mg/dL (ref 0.2–1.2)
BUN: 16 mg/dL (ref 6–23)
CO2: 31 mEq/L (ref 19–32)
Calcium: 9.5 mg/dL (ref 8.4–10.5)
Chloride: 99 mEq/L (ref 96–112)
Creat: 0.67 mg/dL (ref 0.50–1.10)
GLUCOSE: 88 mg/dL (ref 70–99)
Potassium: 3.9 mEq/L (ref 3.5–5.3)
SODIUM: 140 meq/L (ref 135–145)
TOTAL PROTEIN: 8.1 g/dL (ref 6.0–8.3)

## 2013-08-12 LAB — IRON: Iron: 35 ug/dL — ABNORMAL LOW (ref 42–145)

## 2013-08-12 LAB — FERRITIN: FERRITIN: 37 ng/mL (ref 10–291)

## 2013-08-12 MED ORDER — BUDESONIDE-FORMOTEROL FUMARATE 160-4.5 MCG/ACT IN AERO: 2.0000 | INHALATION_SPRAY | Freq: Two times a day (BID) | RESPIRATORY_TRACT | Status: DC

## 2013-08-12 NOTE — Assessment & Plan Note (Signed)
A: stable P: check iron, CMET

## 2013-08-12 NOTE — Progress Notes (Signed)
Subjective:    Patient ID: Rachel [name], female    DOB: 18-Jun-1960, 53 y.o.   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  She see Dr. Lamonte Sakai for sarcoidosis and Dr. Marigene Ehlers for heart failure/Pulm HTN.   She was admitted in 4/15 with acute bronchitis and possibly some CHF. She was given antibiotics, IV Lasix, and prednisone for possible component of sarcoid flare. Echo 4/15 showed EF 60%, normal RV size and systolic function, unable to estimate PA pressure. Since discharge, she has been evaluated by Dr. Aundra Dubin in the heart failure clinic. He is trying to get Tyvaso to replace her Iloprost, because using the Iloprost 6 times daily is very difficult for her.   Today the patient denies any difficulty breathing beyond her baseline. She is using 4L of O2. She is able to complete her ADL's without a problem. She started physical therapy 2 days ago which she is excited about. She has some pain in her right leg when she is exercising, but resolves with rest. She also has some tingling in that leg. She does not have any peripheral edema time. She is taking Lasix as needed for peripheral edema. She is very concerned that she is not getting enough nutrition, because she is losing weight. She thinks that her maximum weight is 186 pounds. Today she is weighs 173 lb. She is trying to eat more regularly and more nutritiously.    Current Outpatient Prescriptions on File Prior to Visit  Medication Sig Dispense Refill  . acetaminophen (TYLENOL) 325 MG tablet Take 650 mg by mouth as needed.        Marland Kitchen albuterol (PROAIR HFA) 108 (90 BASE) MCG/ACT inhaler Inhale 2 puffs into the lungs 4 (four) times daily.  8.5 g  11  . aspirin 81 MG tablet Take 81 mg by mouth daily.        . budesonide-formoterol (SYMBICORT) 160-4.5 MCG/ACT inhaler Inhale 2 puffs into the lungs 2 (two) times daily.  1 Inhaler  6  .  carvedilol (COREG) 6.25 MG tablet Take 1 tablet (6.25 mg total) by mouth 2 (two) times daily with a meal.  60 tablet  0  . esomeprazole (NEXIUM) 40 MG capsule Take 1 capsule (40 mg total) by mouth daily before breakfast.  90 capsule  3  . furosemide (LASIX) 40 MG tablet Take 1 tablet (40 mg total) by mouth every other day.  45 tablet  3  . Iloprost 20 MCG/ML SOLN Inhale into the lungs. Ventavis-- 6 times daily - Inhalation  45 mL  2  . potassium chloride SA (K-DUR,KLOR-CON) 20 MEQ tablet Take 2 tablets (40 mEq total) by mouth every other day. When you take Lasix  270 tablet  2  . predniSONE (DELTASONE) 10 MG tablet Take 1 tablet (10 mg total) by mouth daily with breakfast.  5 tablet  0  . risperiDONE (RISPERDAL) 0.5 MG tablet Take 1 tablet (0.5 mg total) by mouth at bedtime.  90 tablet  3  . sildenafil (REVATIO) 20 MG tablet take 4  tablets every 8 hours      . Spacer/Aero Chamber [employer] Use as directed  1 each  0   No current facility-administered medications on file prior to visit.     Review of Systems Positive for weight loss and right lower extremity pain Negative for fever, chills, sputum production, peripheral edema  Objective:   Physical Exam BP 154/92  Pulse 77  Ht 5' 3" (1.6 m)  Wt 173 lb (78.472 kg)  BMI 30.65 kg/m2  SpO2 95%  Wt Readings from Last 5 Encounters:  08/12/13 173 lb (78.472 kg)  08/05/13 176 lb 8 oz (80.06 kg)  07/31/13 169 lb (76.658 kg)  06/10/13 179 lb (81.194 kg)  05/27/13 179 lb (81.194 kg)    Gen: middle aged AA female, mild obesity, non ill appearing, NAD, pleasant and conversant, oxygen via nasal cannula in place CV: RRR, no m/r/g, no JVD or carotid bruits  Pulm: normal WOB , CTA-B, O2 in place Extremities: patient wearing compression is, in no peripheral edema        Assessment & Plan:

## 2013-08-12 NOTE — Assessment & Plan Note (Signed)
A: well controlled at this time P: continue Symbicort

## 2013-08-12 NOTE — Patient Instructions (Signed)
Dear Ms. Antu,   Thank you for coming to clinic today. Please read below regarding the issues that we discussed.   1. Lungs - Your breathing is great today. We do not need to do anything different. I will change the prescription for Symbicort.   2. Diet - I will check some labs to assess your nutritional status. If anything is abnormal, then I will call you and consider a referral for a dietician.   Please follow up in clinic in 1 month. Please call earlier if you have any questions or concerns.   Sincerely,   Dr. Maricela Bo

## 2013-08-12 NOTE — Assessment & Plan Note (Signed)
A: mild loss of weight and pt BMI still 30, but she is concerned about her nutritional status P: check albumin and pre-albumin, counseled on balanced eating

## 2013-08-13 LAB — PREALBUMIN: Prealbumin: 18.7 mg/dL (ref 17.0–34.0)

## 2013-08-16 ENCOUNTER — Telehealth (HOSPITAL_COMMUNITY): Payer: Self-pay

## 2013-08-16 ENCOUNTER — Telehealth: Payer: Self-pay | Admitting: Emergency Medicine

## 2013-08-16 NOTE — Telephone Encounter (Signed)
Pt has not heard from anyone and would like to talk to a nurse today.

## 2013-08-16 NOTE — Telephone Encounter (Signed)
Called patient regarding entrance to Pulmonary Rehab.  Patient states that they are interested in attending the program.  [place] is going to verify insurance coverage and follow up.

## 2013-08-16 NOTE — Telephone Encounter (Signed)
Spoke with the pt  She is c/o prod cough with thick, yellow sputum  OV with TP tomorrow at 3:45

## 2013-08-17 ENCOUNTER — Encounter: Payer: Self-pay | Admitting: Adult Health

## 2013-08-17 ENCOUNTER — Ambulatory Visit (INDEPENDENT_AMBULATORY_CARE_PROVIDER_SITE_OTHER): Payer: Medicare Other | Admitting: Adult Health

## 2013-08-17 ENCOUNTER — Telehealth: Payer: Self-pay | Admitting: Family Medicine

## 2013-08-17 VITALS — BP 128/66 | HR 90 | Temp 97.9°F | Ht 63.0 in | Wt 174.6 lb

## 2013-08-17 DIAGNOSIS — D509 Iron deficiency anemia, unspecified: Secondary | ICD-10-CM

## 2013-08-17 DIAGNOSIS — J209 Acute bronchitis, unspecified: Secondary | ICD-10-CM | POA: Insufficient documentation

## 2013-08-17 MED ORDER — AZITHROMYCIN 250 MG PO TABS: ORAL_TABLET | ORAL | Status: DC

## 2013-08-17 MED ORDER — FERROUS SULFATE 325 (65 FE) MG PO TABS: ORAL_TABLET | ORAL | Status: DC

## 2013-08-17 NOTE — Assessment & Plan Note (Signed)
Zpack take as directed Claritin 49m At bedtime  As needed  Drainage  Mucinex DM Twice daily  As needed  Cough/congestion  Fluids and rest  Please contact office for sooner follow up if symptoms do not improve or worsen or seek emergency care  Follow up Dr. BLamonte Sakai As planned and As needed

## 2013-08-17 NOTE — Telephone Encounter (Signed)
Pt called and informed of normal labs aside from low iron. I gave her instruction for supplementing and sent prescription to [facility]. The patient will follow up for iron recheck in 6 weeks.

## 2013-08-17 NOTE — Patient Instructions (Signed)
Zpack take as directed Claritin 36m At bedtime  As needed  Drainage  Mucinex DM Twice daily  As needed  Cough/congestion  Fluids and rest  Please contact office for sooner follow up if symptoms do not improve or worsen or seek emergency care  follow up Dr. BLamonte Sakai As planned and As needed

## 2013-08-17 NOTE — Progress Notes (Signed)
Subjective:    Patient ID: Rachel [name], female    DOB: Feb 24, 1961, 53 y.o.   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 She is now on Revatio 80 mg tid and and inhaled Iloprost (added in 2011), managed by Dr Aundra Dubin at [facility]. 6 min walk (7/12) 248 m. RHC (8/12): mean RA 2, PA 51/24, mean PA 35, mean PCWP 6, CI 3, PVR 5.1 WU. She believes that she is doing very well. She wears O2 at 6L/min at all times. She believes she knows when her sarcoidosis is active, remains on flovent.   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 She is now on Revatio 80 mg tid and and inhaled Iloprost (added in 2011). Hasn't has 6 min walk yet. Stopped flovent last time to see if she would miss it.  She is having a bit more exertional SOB w chores, thinks this may be due in part to deconditioning.   CXR today >> improved LLL atx., scattered B infiltrates.   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 She is now on Revatio 80 mg tid and and inhaled Iloprost (added in 2011).  Lately she has been having some increased DOE, some exertional CP. Last 6 minute walk >> never had it yet. Planning to do next month after her ankle is evaluated due to pain when walking (prior fracture).   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 She is now on Revatio 80 mg tid and and inhaled Iloprost.  She has been having nasal congestion and overall more dyspnea for the last . I recommended that she start NLattimer chlorpheniramine. The pharmacist was worried about this, so instead she got afrin x 3 days.    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 She is now on Revatio 80 mg tid and and inhaled  Ventavis. She was looking into change to Tyvaso, but apparently insurance wouldn't cover it?? Not clear why this was refused.   She was having congestion last time > took levaquin and started Ayr spray, improved. Her breathing is at baseline, but she remains quite limited. She can tell when her volume status is up. Planning for TTE in 8/14. 6 minute walk was performed in April, I don't have the data available. She c/o some frequent bruising.   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 Followed by Dr MAundra Dubin> current PAH meds are Revatio 80 tid + Ventavis. Most recent echocardiogram in 01/07/13 showed estimated PASP 44 mmHg (stable), but decreased EF. Cardiac MRI was discussed to look for infiltrative sarcoidosis, but this has not been done - note she has pacer wires in place. She has some bad days, DOE especially. She uses ProAir with good effect.   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 Followed by Dr MAundra Dubin> current PAH meds are Revatio 80 tid + Ventavis. Most recent echocardiogram in 01/07/13 showed estimated PASP 44 mmHg (stable), decreased EF.  Last time we did a trial of Symbicort > she feels that it has helped some, she is less SOB w exertion, able to do more without  pacing herself. It has made her face and neck more swollen/puffy, she feels that she is eating more.   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 She is on an aggressive PAH regimen. PFT support mixed disease. She benefited from Symbicort but insurance has rejected and says she needs to fail Advair. She has the Advair but hasn't tried it yet.   08/17/13  Acute OV    Complains of prod cough with yellow mucus, increased DOE, wheezing, runny/stuffy nose, HA, PND x3days.  Denies tightness, f/c/s, hemoptysis, nausea, vomiting.  Patient denies any chest pain, rash, joint  swelling. Complains of a postnasal drip, and drainage. No recent travel. Patient was recently admitted last month for possible fluid overload . Symptoms improved with diuresis. She was also given empiric Levaquin for possible underlying pneumonia chest x-ray showed diffuse pulmonary fibrosis, with no acute changes noted  ROS Constitutional:   No  weight loss, night sweats,  Fevers, chills, fatigue, or  lassitude.  HEENT:   No headaches,  Difficulty swallowing,  Tooth/dental problems, or  Sore throat,                No sneezing, itching, ear ache,  +nasal congestion, post nasal drip,   CV:  No chest pain,  Orthopnea, PND, swelling in lower extremities, anasarca, dizziness, palpitations, syncope.   GI  No heartburn, indigestion, abdominal pain, nausea, vomiting, diarrhea, change in bowel habits, loss of appetite, bloody stools.   Resp:   No chest wall deformity  Skin: no rash or lesions.  GU: no dysuria, change in color of urine, no urgency or frequency.  No flank pain, no hematuria   MS:  No joint pain or swelling.  No decreased range of motion.  No back pain.  Psych:  No change in mood or affect. No depression or anxiety.  No memory loss.      Objective:   Physical Exam  Gen: Pleasant, overwt, in no distress on O2,  normal affect  ENT: No lesions,  mouth clear,  oropharynx clear, no postnasal drip  Neck: No JVD, no TMG, no carotid bruits  Lungs: No use of accessory muscles, few B insp crackles, no wheezing   Cardiovascular: RRR, no m/r/g   Musculoskeletal: No deformities, no cyanosis or clubbing  Neuro: alert, non focal  Skin: Warm, no lesions or rashes    01/07/13 --  Study Conclusions - Left ventricle: The cavity size was normal. Wall thickness was normal. Systolic function was mildly to moderately reduced. The estimated ejection fraction was in the range of 40% to 45%. Wall motion was normal; there were no regional wall motion abnormalities. Doppler  parameters are consistent with abnormal left ventricular relaxation (grade 1 diastolic dysfunction). - Mitral valve: Mild regurgitation. - Right ventricle: The cavity size was mildly dilated. Systolic function was mildly reduced. - Atrial septum: No defect or patent foramen ovale was identified. - Pulmonary arteries: PA peak pressure: 53m Hg (S). - Pericardium, extracardiac: A trivial pericardial effusion was identified.  CXR 07/28/13   Diffuse coarse pulmonary fibrosis compatible with history of  sarcoidosis. No acute changes since previous study.    Assessment & Plan:

## 2013-08-19 ENCOUNTER — Other Ambulatory Visit: Payer: Self-pay | Admitting: *Deleted

## 2013-08-19 ENCOUNTER — Telehealth (HOSPITAL_COMMUNITY): Payer: Self-pay

## 2013-08-19 MED ORDER — CARVEDILOL 6.25 MG PO TABS: 6.2500 mg | ORAL_TABLET | Freq: Two times a day (BID) | ORAL | Status: DC

## 2013-08-19 NOTE — Telephone Encounter (Signed)
I contacted the patient in regards to Pulmonary Rehab.  Patient states that she is on a tight budget right now due to her daughter being out of work and living with her.  Rachel [name] states that she can not afford the 20% that Medicare does not cover.  The patient was encouraged to contact us in the future if her financial situation improves.

## 2013-08-21 ENCOUNTER — Encounter (HOSPITAL_COMMUNITY): Payer: Self-pay | Admitting: Emergency Medicine

## 2013-08-21 ENCOUNTER — Emergency Department (HOSPITAL_COMMUNITY): Payer: Medicare Other

## 2013-08-21 ENCOUNTER — Emergency Department (HOSPITAL_COMMUNITY)
Admission: EM | Admit: 2013-08-21 | Discharge: 2013-08-21 | Disposition: A | Discharge status: Home / Self Care | Payer: Medicare Other | Attending: Emergency Medicine | Admitting: Emergency Medicine

## 2013-08-21 DIAGNOSIS — I2789 Other specified pulmonary heart diseases: Secondary | ICD-10-CM | POA: Insufficient documentation

## 2013-08-21 DIAGNOSIS — I509 Heart failure, unspecified: Secondary | ICD-10-CM | POA: Insufficient documentation

## 2013-08-21 DIAGNOSIS — J449 Chronic obstructive pulmonary disease, unspecified: Secondary | ICD-10-CM

## 2013-08-21 DIAGNOSIS — F319 Bipolar disorder, unspecified: Secondary | ICD-10-CM | POA: Insufficient documentation

## 2013-08-21 DIAGNOSIS — G8929 Other chronic pain: Secondary | ICD-10-CM | POA: Insufficient documentation

## 2013-08-21 DIAGNOSIS — Z7982 Long term (current) use of aspirin: Secondary | ICD-10-CM | POA: Insufficient documentation

## 2013-08-21 DIAGNOSIS — Z87891 Personal history of nicotine dependence: Secondary | ICD-10-CM | POA: Insufficient documentation

## 2013-08-21 DIAGNOSIS — Z9889 Other specified postprocedural states: Secondary | ICD-10-CM | POA: Insufficient documentation

## 2013-08-21 DIAGNOSIS — J4489 Other specified chronic obstructive pulmonary disease: Secondary | ICD-10-CM | POA: Insufficient documentation

## 2013-08-21 DIAGNOSIS — F29 Unspecified psychosis not due to a substance or known physiological condition: Secondary | ICD-10-CM | POA: Insufficient documentation

## 2013-08-21 DIAGNOSIS — Z8619 Personal history of other infectious and parasitic diseases: Secondary | ICD-10-CM | POA: Insufficient documentation

## 2013-08-21 DIAGNOSIS — F411 Generalized anxiety disorder: Secondary | ICD-10-CM | POA: Insufficient documentation

## 2013-08-21 DIAGNOSIS — Z862 Personal history of diseases of the blood and blood-forming organs and certain disorders involving the immune mechanism: Secondary | ICD-10-CM | POA: Insufficient documentation

## 2013-08-21 DIAGNOSIS — F209 Schizophrenia, unspecified: Secondary | ICD-10-CM | POA: Insufficient documentation

## 2013-08-21 DIAGNOSIS — K219 Gastro-esophageal reflux disease without esophagitis: Secondary | ICD-10-CM | POA: Insufficient documentation

## 2013-08-21 DIAGNOSIS — I5022 Chronic systolic (congestive) heart failure: Secondary | ICD-10-CM | POA: Insufficient documentation

## 2013-08-21 DIAGNOSIS — Z79899 Other long term (current) drug therapy: Secondary | ICD-10-CM | POA: Insufficient documentation

## 2013-08-21 DIAGNOSIS — Z91040 Latex allergy status: Secondary | ICD-10-CM | POA: Insufficient documentation

## 2013-08-21 LAB — CBC WITH DIFFERENTIAL/PLATELET
Basophils Absolute: 0 10*3/uL (ref 0.0–0.1)
Basophils Relative: 1 % (ref 0–1)
EOS ABS: 0.6 10*3/uL (ref 0.0–0.7)
Eosinophils Relative: 8 % — ABNORMAL HIGH (ref 0–5)
HEMATOCRIT: 35.8 % — AB (ref 36.0–46.0)
HEMOGLOBIN: 11.6 g/dL — AB (ref 12.0–15.0)
LYMPHS ABS: 2 10*3/uL (ref 0.7–4.0)
Lymphocytes Relative: 27 % (ref 12–46)
MCH: 28.5 pg (ref 26.0–34.0)
MCHC: 32.4 g/dL (ref 30.0–36.0)
MCV: 88 fL (ref 78.0–100.0)
MONO ABS: 0.6 10*3/uL (ref 0.1–1.0)
MONOS PCT: 8 % (ref 3–12)
NEUTROS PCT: 56 % (ref 43–77)
Neutro Abs: 4.3 10*3/uL (ref 1.7–7.7)
Platelets: 251 10*3/uL (ref 150–400)
RBC: 4.07 MIL/uL (ref 3.87–5.11)
RDW: 13.8 % (ref 11.5–15.5)
WBC: 7.6 10*3/uL (ref 4.0–10.5)

## 2013-08-21 LAB — URINE MICROSCOPIC-ADD ON

## 2013-08-21 LAB — RAPID URINE DRUG SCREEN, HOSP PERFORMED
AMPHETAMINES: NOT DETECTED
BARBITURATES: NOT DETECTED
BENZODIAZEPINES: NOT DETECTED
COCAINE: NOT DETECTED
Opiates: NOT DETECTED
TETRAHYDROCANNABINOL: NOT DETECTED

## 2013-08-21 LAB — I-STAT CHEM 8, ED
BUN: 8 mg/dL (ref 6–23)
CALCIUM ION: 1.22 mmol/L (ref 1.12–1.23)
CHLORIDE: 99 meq/L (ref 96–112)
CREATININE: 0.8 mg/dL (ref 0.50–1.10)
GLUCOSE: 103 mg/dL — AB (ref 70–99)
HCT: 36 % (ref 36.0–46.0)
Hemoglobin: 12.2 g/dL (ref 12.0–15.0)
Potassium: 3.5 mEq/L — ABNORMAL LOW (ref 3.7–5.3)
Sodium: 141 mEq/L (ref 137–147)
TCO2: 31 mmol/L (ref 0–100)

## 2013-08-21 LAB — URINALYSIS, ROUTINE W REFLEX MICROSCOPIC
BILIRUBIN URINE: NEGATIVE
Glucose, UA: NEGATIVE mg/dL
HGB URINE DIPSTICK: NEGATIVE
KETONES UR: NEGATIVE mg/dL
Nitrite: NEGATIVE
PH: 6 (ref 5.0–8.0)
Protein, ur: NEGATIVE mg/dL
Specific Gravity, Urine: 1.018 (ref 1.005–1.030)
Urobilinogen, UA: 0.2 mg/dL (ref 0.0–1.0)

## 2013-08-21 LAB — CBG MONITORING, ED: GLUCOSE-CAPILLARY: 105 mg/dL — AB (ref 70–99)

## 2013-08-21 LAB — PRO B NATRIURETIC PEPTIDE: Pro B Natriuretic peptide (BNP): 272.9 pg/mL — ABNORMAL HIGH (ref 0–125)

## 2013-08-21 MED ORDER — ASPIRIN EC 81 MG PO TBEC: 81.0000 mg | DELAYED_RELEASE_TABLET | Freq: Every day | ORAL | Status: DC

## 2013-08-21 MED ORDER — POTASSIUM CHLORIDE CRYS ER 20 MEQ PO TBCR
40.0000 meq | EXTENDED_RELEASE_TABLET | ORAL | Status: DC
  Filled 2013-08-21: qty 2

## 2013-08-21 MED ORDER — ESOMEPRAZOLE MAGNESIUM 40 MG PO CPDR: 40.0000 mg | DELAYED_RELEASE_CAPSULE | Freq: Every day | ORAL | Status: DC

## 2013-08-21 MED ORDER — ALBUTEROL SULFATE HFA 108 (90 BASE) MCG/ACT IN AERS
2.0000 | INHALATION_SPRAY | Freq: Four times a day (QID) | RESPIRATORY_TRACT | Status: DC
  Administered 2013-08-21: 2 via RESPIRATORY_TRACT
  Filled 2013-08-21 (×2): qty 6.7

## 2013-08-21 MED ORDER — SILDENAFIL CITRATE 20 MG PO TABS
80.0000 mg | ORAL_TABLET | Freq: Three times a day (TID) | ORAL | Status: DC
  Filled 2013-08-21: qty 4

## 2013-08-21 MED ORDER — RISPERIDONE 1 MG PO TABS: 1.0000 mg | ORAL_TABLET | Freq: Every day | ORAL | Status: DC

## 2013-08-21 MED ORDER — FUROSEMIDE 40 MG PO TABS
40.0000 mg | ORAL_TABLET | ORAL | Status: DC
  Filled 2013-08-21: qty 1

## 2013-08-21 MED ORDER — CEPHALEXIN 500 MG PO CAPS: 500.0000 mg | ORAL_CAPSULE | Freq: Two times a day (BID) | ORAL | Status: DC

## 2013-08-21 MED ORDER — FUROSEMIDE 40 MG PO TABS: 40.0000 mg | ORAL_TABLET | ORAL | Status: DC

## 2013-08-21 MED ORDER — BUDESONIDE-FORMOTEROL FUMARATE 160-4.5 MCG/ACT IN AERO
2.0000 | INHALATION_SPRAY | Freq: Two times a day (BID) | RESPIRATORY_TRACT | Status: DC
  Filled 2013-08-21: qty 6

## 2013-08-21 MED ORDER — CARVEDILOL 6.25 MG PO TABS: 6.2500 mg | ORAL_TABLET | Freq: Two times a day (BID) | ORAL | Status: DC

## 2013-08-21 MED ORDER — SILDENAFIL CITRATE 20 MG PO TABS: 80.0000 mg | ORAL_TABLET | Freq: Three times a day (TID) | ORAL | Status: AC

## 2013-08-21 MED ORDER — CIPROFLOXACIN HCL 500 MG PO TABS
500.0000 mg | ORAL_TABLET | Freq: Two times a day (BID) | ORAL | Status: DC
  Filled 2013-08-21: qty 1

## 2013-08-21 MED ORDER — ALBUTEROL SULFATE HFA 108 (90 BASE) MCG/ACT IN AERS
2.0000 | INHALATION_SPRAY | Freq: Four times a day (QID) | RESPIRATORY_TRACT | Status: DC
Start: 2013-08-21 — End: 2013-10-01

## 2013-08-21 MED ORDER — CARVEDILOL 6.25 MG PO TABS
6.2500 mg | ORAL_TABLET | Freq: Two times a day (BID) | ORAL | Status: DC
Start: 2013-08-21 — End: 2013-08-21
  Filled 2013-08-21 (×2): qty 1

## 2013-08-21 MED ORDER — FERROUS SULFATE 325 (65 FE) MG PO TABS: 650.0000 mg | ORAL_TABLET | Freq: Every evening | ORAL | Status: DC

## 2013-08-21 MED ORDER — ASPIRIN EC 81 MG PO TBEC
81.0000 mg | DELAYED_RELEASE_TABLET | Freq: Every day | ORAL | Status: DC
  Administered 2013-08-21: 81 mg via ORAL
  Filled 2013-08-21: qty 1

## 2013-08-21 MED ORDER — ILOPROST 20 MCG/ML IN SOLN: 5.0000 ug | RESPIRATORY_TRACT | Status: DC

## 2013-08-21 MED ORDER — BUDESONIDE-FORMOTEROL FUMARATE 160-4.5 MCG/ACT IN AERO: 2.0000 | INHALATION_SPRAY | Freq: Two times a day (BID) | RESPIRATORY_TRACT | Status: DC

## 2013-08-21 MED ORDER — POTASSIUM CHLORIDE CRYS ER 20 MEQ PO TBCR: 40.0000 meq | EXTENDED_RELEASE_TABLET | ORAL | Status: DC

## 2013-08-21 MED ORDER — RISPERIDONE 0.5 MG PO TABS: 0.5000 mg | ORAL_TABLET | Freq: Every day | ORAL | Status: DC

## 2013-08-21 MED ORDER — PANTOPRAZOLE SODIUM 40 MG PO TBEC
80.0000 mg | DELAYED_RELEASE_TABLET | Freq: Every day | ORAL | Status: DC
  Administered 2013-08-21: 80 mg via ORAL
  Filled 2013-08-21: qty 2

## 2013-08-21 MED ORDER — FERROUS SULFATE 325 (65 FE) MG PO TABS
650.0000 mg | ORAL_TABLET | Freq: Every evening | ORAL | Status: DC
  Filled 2013-08-21: qty 2

## 2013-08-21 MED ORDER — ILOPROST 20 MCG/ML IN SOLN: RESPIRATORY_TRACT | Status: DC

## 2013-08-21 NOTE — ED Notes (Signed)
Patient D/C via EMS with son

## 2013-08-21 NOTE — ED Notes (Signed)
Tele psych machine at bedside, evaluator not ready yet.

## 2013-08-21 NOTE — ED Notes (Signed)
Phlebotomy at bedside.

## 2013-08-21 NOTE — ED Notes (Signed)
CBG 105

## 2013-08-21 NOTE — Progress Notes (Signed)
Psychiatry will round on pt when they do rounds this morning due to the complexity of the case and they will advise nurse and MD.

## 2013-08-21 NOTE — ED Notes (Signed)
Per EMS pt transported from home after family noted confusion onset yesterday with agitation. A & O with EMS. Pt prescribed Zpack 3 days ago. Pt is on continuous 02.

## 2013-08-21 NOTE — Consult Note (Signed)
[facility] Consult   Reason for Consult:  Medical issues with medication adjustment requested Referring Physician:  EDP Rachel [name] is an 53 y.o. female. Total Time spent with patient: 20 minutes  Assessment: AXIS I:  Bipolar disorder AXIS II:  Deferred AXIS III:   Past Medical History  Diagnosis Date  . GERD (gastroesophageal reflux disease)   . Hypertension   . 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 CHF (congestive heart failure)     Right side- Dr. Aundra Dubin ([facility])  . Pulmonary HTN   . Anxiety   . Cardiomyopathy   . NSVT (nonsustained ventricular tachycardia)     with syncope: St Jude ICD was implanted. Device now nearing ERI. Given end stage lung disease and normalization of LV function, the device will not be replaced and tachy therapies were turned off  . Chronic chest pain     LHC (08/2008) with no angiographic coronary diease  . Allergic rhinitis   . H/O: iron deficiency anemia   . Secondary amenorrhea     irregular menses  . Psychosis     with high dose steroids  . SVT (supraventricular tachycardia)     possible runs  . Hypokalemia     recurrent  . Rotator cuff sprain   . Chronic back pain    AXIS IV:  chronic medical issues AXIS V:  61-70 mild symptoms  Plan:  No evidence of imminent risk to self or others at present.  Dr. Louretta Shorten assessed the patient and concurs with the plan.  Subjective:   Rachel [name] is a 53 y.o. female patient does not warrant admission.  HPI:  Patient was recently discharged from the hospital with a lung infection related to her Sarcoidosis.  She also has a history of bipolar disorder and has been taking Risperdal 0.5 mg at bedtime with stability for about 8-9 years. This past week her adult children have been noting behavior changes and her pregnant daughter brought her to the ED, insisted she see a psychiatrist.  Her daughter acted out and was almost asked to leave, patient was the stable one from the report.  Today, her son is at her bedside and both are calm.  Her labs indicate a significant urinary tract infection which could be contributing to her behaviors.  She reports more racing thoughts at bedtime and would like an increase in her Risperdal from 0.5 to 1 mg.  Antibiotic in place for her lung infection and UTI.  Denies suicidal/homicidal ideations and hallucinations.  Calm, cooperative, answers questions appropriately.  HPI Elements:  Location:  generalized. Quality:  acute. Severity:  milde. Timing:  intermittent. Duration:  few evenings. Context:  medical infections.  Past Psychiatric History: Past Medical History  Diagnosis Date  . GERD (gastroesophageal reflux disease)   . Hypertension   . 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 CHF (congestive heart failure)     Right side- Dr. Aundra Dubin ([facility])  . Pulmonary HTN   . Anxiety   . Cardiomyopathy   . NSVT (nonsustained ventricular tachycardia)     with syncope: St Jude ICD was implanted. Device now nearing ERI. Given end stage lung disease and normalization of LV function, the device will not be replaced and tachy therapies were turned off  . Chronic chest pain     LHC (08/2008) with no angiographic coronary diease  . Allergic rhinitis   . H/O: iron deficiency anemia   . Secondary  amenorrhea     irregular menses  . Psychosis     with high dose steroids  . SVT (supraventricular tachycardia)     possible runs  . Hypokalemia     recurrent  . Rotator cuff sprain   . Chronic back pain     reports that she quit smoking about 21 years ago. Her smoking use included Cigarettes. She has a 16.5 pack-year smoking history. She has never used smokeless tobacco. She reports that she drinks alcohol. She reports that she does not use illicit drugs. Family History  Problem Relation Age of Onset  . Lung cancer Father   . Cancer Father     lung cancer  . Hypertension    . Diabetes Mother     border line  . Hypertension Mother   . Heart  failure Mother            Allergies:   Allergies  Allergen Reactions  . Latex Rash  . Nitroglycerin Other (See Comments)    Patient is on Revatio  . Ace Inhibitors   . Meperidine Hcl Other (See Comments)    REACTION: Makes her feel funny  . Other Other (See Comments)    High Dose Steroids cause chemical imbalance and altered mental status  . Tramadol Hcl Other (See Comments)    REACTION: nausea, lightheadedness, sleepiness, and dizziness  . Propoxyphene N-Acetaminophen Rash    REACTION: rash: pruritus    ACT Assessment Complete:  Yes:    Educational Status    Risk to Self: Risk to self Is patient at risk for suicide?: No  Risk to Others:    Abuse:    Prior Inpatient Therapy:    Prior Outpatient Therapy:    Additional Information:                    Objective: Blood pressure 132/64, pulse 81, temperature 98.7 F (37.1 C), temperature source Oral, resp. rate 16, SpO2 100.00%.There is no weight on file to calculate BMI. Results for orders placed during the hospital encounter of 08/21/13 (from the past 72 hour(s))  CBG MONITORING, ED     Status: Abnormal   Collection Time    08/21/13  2:25 AM      Result Value Ref Range   Glucose-Capillary 105 (*) 70 - 99 mg/dL   Comment 1 Notify RN    CBC WITH DIFFERENTIAL     Status: Abnormal   Collection Time    08/21/13  3:14 AM      Result Value Ref Range   WBC 7.6  4.0 - 10.5 K/uL   RBC 4.07  3.87 - 5.11 MIL/uL   Hemoglobin 11.6 (*) 12.0 - 15.0 g/dL   HCT 35.8 (*) 36.0 - 46.0 %   MCV 88.0  78.0 - 100.0 fL   MCH 28.5  26.0 - 34.0 pg   MCHC 32.4  30.0 - 36.0 g/dL   RDW 13.8  11.5 - 15.5 %   Platelets 251  150 - 400 K/uL   Neutrophils Relative % 56  43 - 77 %   Neutro Abs 4.3  1.7 - 7.7 K/uL   Lymphocytes Relative 27  12 - 46 %   Lymphs Abs 2.0  0.7 - 4.0 K/uL   Monocytes Relative 8  3 - 12 %   Monocytes Absolute 0.6  0.1 - 1.0 K/uL   Eosinophils Relative 8 (*) 0 - 5 %   Eosinophils Absolute 0.6  0.0 - 0.7  K/uL  Basophils Relative 1  0 - 1 %   Basophils Absolute 0.0  0.0 - 0.1 K/uL  PRO B NATRIURETIC PEPTIDE     Status: Abnormal   Collection Time    08/21/13  3:15 AM      Result Value Ref Range   Pro B Natriuretic peptide (BNP) 272.9 (*) 0 - 125 pg/mL  I-STAT CHEM 8, ED     Status: Abnormal   Collection Time    08/21/13  3:48 AM      Result Value Ref Range   Sodium 141  137 - 147 mEq/L   Potassium 3.5 (*) 3.7 - 5.3 mEq/L   Chloride 99  96 - 112 mEq/L   BUN 8  6 - 23 mg/dL   Creatinine, Ser 0.80  0.50 - 1.10 mg/dL   Glucose, Bld 103 (*) 70 - 99 mg/dL   Calcium, Ion 1.22  1.12 - 1.23 mmol/L   TCO2 31  0 - 100 mmol/L   Hemoglobin 12.2  12.0 - 15.0 g/dL   HCT 36.0  36.0 - 46.0 %  URINALYSIS, ROUTINE W REFLEX MICROSCOPIC     Status: Abnormal   Collection Time    08/21/13  4:39 AM      Result Value Ref Range   Color, Urine YELLOW  YELLOW   APPearance CLOUDY (*) CLEAR   Specific Gravity, Urine 1.018  1.005 - 1.030   pH 6.0  5.0 - 8.0   Glucose, UA NEGATIVE  NEGATIVE mg/dL   Hgb urine dipstick NEGATIVE  NEGATIVE   Bilirubin Urine NEGATIVE  NEGATIVE   Ketones, ur NEGATIVE  NEGATIVE mg/dL   Protein, ur NEGATIVE  NEGATIVE mg/dL   Urobilinogen, UA 0.2  0.0 - 1.0 mg/dL   Nitrite NEGATIVE  NEGATIVE   Leukocytes, UA LARGE (*) NEGATIVE  URINE RAPID DRUG SCREEN (HOSP PERFORMED)     Status: None   Collection Time    08/21/13  4:39 AM      Result Value Ref Range   Opiates NONE DETECTED  NONE DETECTED   Cocaine NONE DETECTED  NONE DETECTED   Benzodiazepines NONE DETECTED  NONE DETECTED   Amphetamines NONE DETECTED  NONE DETECTED   Tetrahydrocannabinol NONE DETECTED  NONE DETECTED   Barbiturates NONE DETECTED  NONE DETECTED   Comment:            DRUG SCREEN FOR MEDICAL PURPOSES     ONLY.  IF CONFIRMATION IS NEEDED     FOR ANY PURPOSE, NOTIFY LAB     WITHIN 5 DAYS.                LOWEST DETECTABLE LIMITS     FOR URINE DRUG SCREEN     Drug Class       Cutoff (ng/mL)     Amphetamine       1000     Barbiturate      200     Benzodiazepine   053     Tricyclics       976     Opiates          300     Cocaine          300     THC              50  URINE MICROSCOPIC-ADD ON     Status: Abnormal   Collection Time    08/21/13  4:39 AM      Result Value Ref Range  Squamous Epithelial / LPF FEW (*) RARE   WBC, UA 3-6  <3 WBC/hpf   RBC / HPF 0-2  <3 RBC/hpf   Bacteria, UA RARE  RARE   Labs are reviewed and are pertinent for medical issues being addressed.  Current Facility-Administered Medications  Medication Dose Route Frequency Provider Last Rate Last Dose  . albuterol (PROVENTIL HFA;VENTOLIN HFA) 108 (90 BASE) MCG/ACT inhaler 2 puff  2 puff Inhalation QID Kathalene Frames, MD   2 puff at 08/21/13 1052  . aspirin EC tablet 81 mg  81 mg Oral Daily Kathalene Frames, MD   81 mg at 08/21/13 1058  . budesonide-formoterol (SYMBICORT) 160-4.5 MCG/ACT inhaler 2 puff  2 puff Inhalation BID Kathalene Frames, MD      . carvedilol (COREG) tablet 6.25 mg  6.25 mg Oral BID WC Kathalene Frames, MD      . ciprofloxacin (CIPRO) tablet 500 mg  500 mg Oral BID Waylan Boga, NP      . ferrous sulfate tablet 650 mg  650 mg Oral QPM Kathalene Frames, MD      . furosemide (LASIX) tablet 40 mg  40 mg Oral QODAY Kathalene Frames, MD      . Iloprost SOLN 5 mcg  5 mcg Inhalation 6 times per day Kathalene Frames, MD      . pantoprazole (PROTONIX) EC tablet 80 mg  80 mg Oral Q1200 Kathalene Frames, MD      . potassium chloride SA (K-DUR,KLOR-CON) CR tablet 40 mEq  40 mEq Oral QODAY Kathalene Frames, MD      . risperiDONE (RISPERDAL) tablet 1 mg  1 mg Oral QHS Waylan Boga, NP      . sildenafil (REVATIO) tablet 80 mg  80 mg Oral TID Kathalene Frames, MD       Current Outpatient Prescriptions  Medication Sig Dispense Refill  . acetaminophen (TYLENOL) 325 MG tablet Take 650 mg by mouth every 6 (six) hours as needed for mild pain.       Marland Kitchen albuterol (PROAIR HFA) 108 (90 BASE) MCG/ACT inhaler Inhale 2 puffs into the lungs 4 (four) times daily.  8.5 g  11   . aspirin EC 81 MG tablet Take 81 mg by mouth daily.      . budesonide-formoterol (SYMBICORT) 160-4.5 MCG/ACT inhaler Inhale 2 puffs into the lungs 2 (two) times daily.  2 Inhaler  6  . carvedilol (COREG) 6.25 MG tablet Take 1 tablet (6.25 mg total) by mouth 2 (two) times daily with a meal.  60 tablet  0  . esomeprazole (NEXIUM) 40 MG capsule Take 1 capsule (40 mg total) by mouth daily before breakfast.  90 capsule  3  . ferrous sulfate 325 (65 FE) MG tablet Take 650 mg by mouth every evening.      . furosemide (LASIX) 40 MG tablet Take 1 tablet (40 mg total) by mouth every other day.  45 tablet  3  . Iloprost 20 MCG/ML SOLN Inhale into the lungs. Ventavis-- 6 times daily - Inhalation  45 mL  2  . potassium chloride SA (K-DUR,KLOR-CON) 20 MEQ tablet Take 2 tablets (40 mEq total) by mouth every other day. When you take Lasix  270 tablet  2  . risperiDONE (RISPERDAL) 0.5 MG tablet Take 1 tablet (0.5 mg total) by mouth at bedtime.  90 tablet  3  . sildenafil (REVATIO) 20 MG tablet Take 80 mg by mouth 3 (three) times daily. take  4  tablets every 8 hours        Psychiatric Specialty Exam:     Blood pressure 132/64, pulse 81, temperature 98.7 F (37.1 C), temperature source Oral, resp. rate 16, SpO2 100.00%.There is no weight on file to calculate BMI.  General Appearance: Casual  Eye Contact::  Good  Speech:  Normal Rate  Volume:  Normal  Mood:  Euthymic  Affect:  Congruent  Thought Process:  Coherent  Orientation:  Full (Time, Place, and Person)  Thought Content:  WDL  Suicidal Thoughts:  No  Homicidal Thoughts:  No  Memory:  Immediate;   Good Recent;   Good Remote;   Good  Judgement:  Good  Insight:  Good  Psychomotor Activity:  Normal  Concentration:  Good  Recall:  Good  Fund of Knowledge:Good  Language: Good  Akathisia:  No  Handed:  Right  AIMS (if indicated):     Assets:  Communication Skills Desire for Improvement Housing Leisure Time Physical  Health Resilience Social Support Transportation  Sleep:      Musculoskeletal: Strength & Muscle Tone: within normal limits Gait & Station: normal Patient leans: N/A  Treatment Plan Summary: Increase Risperdal from 0.5 mg to 1 mg for mood stability, follow-up with her regular provider and return to ED or her primary care physicians regarding her lung and urinary tract infections.  Waylan Boga, PMH-NP 08/21/2013 11:30 AM  Patient was seen face to face for psychiatric evaluation, spoke with her son who was at bed side and case discussed with physician extender and formulated treatment plan. Reviewed the information documented and agree with the treatment plan.  Parke Simmers Cassell Voorhies 08/21/2013 1:23 PM

## 2013-08-21 NOTE — [facility] Suicide Risk Assessment (Signed)
Suicide Risk Assessment  Discharge Assessment     Demographic Factors:  NA  Total Time spent with patient: 20 minutes Psychiatric Specialty Exam:     Blood pressure 132/64, pulse 81, temperature 98.7 F (37.1 C), temperature source Oral, resp. rate 16, SpO2 100.00%.There is no weight on file to calculate BMI.  General Appearance: Casual  Eye Contact::  Good  Speech:  Normal Rate  Volume:  Normal  Mood:  Euthymic  Affect:  Congruent  Thought Process:  Coherent  Orientation:  Full (Time, Place, and Person)  Thought Content:  WDL  Suicidal Thoughts:  No  Homicidal Thoughts:  No  Memory:  Immediate;   Good Recent;   Good Remote;   Good  Judgement:  Good  Insight:  Good  Psychomotor Activity:  Normal  Concentration:  Good  Recall:  Good  Fund of Knowledge:Good  Language: Good  Akathisia:  No  Handed:  Right  AIMS (if indicated):     Assets:  Communication Skills Desire for Improvement Housing Leisure Time Physical Health Resilience Social Support Transportation  Sleep:      Musculoskeletal: Strength & Muscle Tone: within normal limits Gait & Station: normal Patient leans: N/A   Mental Status Per Nursing Assessment::   On Admission: Patient has been calm and cooperative during her stay, requests medication change.  She is primarily a medical patient on this visit.    Current Mental Status by Physician: NA  Loss Factors: NA  Historical Factors: NA  Risk Reduction Factors:   Sense of responsibility to family, Religious beliefs about death, Living with another person, especially a relative, Positive social support, Positive therapeutic relationship and Positive coping skills or problem solving skills  Continued Clinical Symptoms:  Bipolar disorder  Cognitive Features That Contribute To Risk:  None  Suicide Risk:  Minimal: No identifiable suicidal ideation.  Patients presenting with no risk factors but with morbid ruminations; may be classified as  minimal risk based on the severity of the depressive symptoms  Discharge Diagnoses:   AXIS I:  Bipolar disorder AXIS II:  Deferred AXIS III:   Past Medical History  Diagnosis Date  . GERD (gastroesophageal reflux disease)   . Hypertension   . 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 CHF (congestive heart failure)     Right side- Dr. Aundra Dubin ([facility])  . Pulmonary HTN   . Anxiety   . Cardiomyopathy   . NSVT (nonsustained ventricular tachycardia)     with syncope: St Jude ICD was implanted. Device now nearing ERI. Given end stage lung disease and normalization of LV function, the device will not be replaced and tachy therapies were turned off  . Chronic chest pain     LHC (08/2008) with no angiographic coronary diease  . Allergic rhinitis   . H/O: iron deficiency anemia   . Secondary amenorrhea     irregular menses  . Psychosis     with high dose steroids  . SVT (supraventricular tachycardia)     possible runs  . Hypokalemia     recurrent  . Rotator cuff sprain   . Chronic back pain    AXIS IV:  chronic medical issues AXIS V:  61-70 mild symptoms  Plan Of Care/Follow-up recommendations:  Activity:  as tolerated Diet:  low-sodium heart healthy diet  Is patient on multiple antipsychotic therapies at discharge:  No   Has Patient had three or more failed trials of antipsychotic monotherapy by history:  No  Recommended  Plan for Multiple Antipsychotic Therapies: NA    Waylan Boga, PMH-NP 08/21/2013, 11:45 AM

## 2013-08-21 NOTE — ED Provider Notes (Signed)
CSN: [id number]     Arrival date & time 08/21/13  0218 History   First MD Initiated Contact with Patient 08/21/13 0303     Chief Complaint  Patient presents with  . Altered Mental Status     (Consider location/radiation/quality/duration/timing/severity/associated sxs/prior Treatment) HPI Comments: PAtinet has been depressed Has Hz of psychosis and reports not taking medications as prescribed.  Family reports that patient is sleeping mor than normal and not participating in normal activities.  Patient states has not felt well since recent hospitalization for SOB   Patient is a 53 y.o. female presenting with altered mental status. The history is provided by the patient and a relative.  Altered Mental Status Presenting symptoms: behavior changes, confusion and lethargy   Severity:  Mild Most recent episode:  2 days ago Episode history:  Multiple Timing:  Intermittent Progression:  Unchanged Chronicity:  Recurrent Context: not taking medications as prescribed   Associated symptoms: depression   Associated symptoms: no fever, no headaches and no nausea     Past Medical History  Diagnosis Date  . GERD (gastroesophageal reflux disease)   . Hypertension   . 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 CHF (congestive heart failure)     Right side- Dr. Aundra Dubin ([facility])  . Pulmonary HTN   . Anxiety   . Cardiomyopathy   . NSVT (nonsustained ventricular tachycardia)     with syncope: St Jude ICD was implanted. Device now nearing ERI. Given end stage lung disease and normalization of LV function, the device will not be replaced and tachy therapies were turned off  . Chronic chest pain     LHC (08/2008) with no angiographic coronary diease  . Allergic rhinitis   . H/O: iron deficiency anemia   . Secondary amenorrhea     irregular menses  . Psychosis     with high dose steroids  . SVT (supraventricular tachycardia)     possible runs  . Hypokalemia     recurrent  . Rotator cuff sprain    . Chronic back pain    Past Surgical History  Procedure Laterality Date  . Cardiac catheterization  08/26/2008    5% EF with normal coronary arteries and normal wall motion  . Adenosine cardiolyte  03/31/2003    no ischemia/infarct; marked global LV hypekinesis; resting EF 22%  . Cardiac catheterization  03/31/2003    globally depressed LV fxn with EF 20%; idiopathic dilated cardiomyopathy  . Defibrillator placed  03/31/2003    St. Jude single chamber (Dr. Cristopher Peru)  . Pfts      FVC 2000 (56%) FEV1 1400 (47%), ratio 0.7; insignif response to bronchodilatior; stable from 1/05 - 9/05; PFTs: Mod restriction, FVC 1.9L, FEV1 1.6L, both 53% predicted; slt response to brochodilators 04/08/2002   Family History  Problem Relation Age of Onset  . Lung cancer Father   . Cancer Father     lung cancer  . Hypertension    . Diabetes Mother     border line  . Hypertension Mother   . Heart failure Mother    History  Substance Use Topics  . Smoking status: Former Smoker -- 1.50 packs/day for 11 years    Types: Cigarettes    Quit date: 04/08/1992  . Smokeless tobacco: Never Used     Comment: 1ppd x 20 years  . Alcohol Use: Yes     Comment: remote history of alcohol abuse (quit 1994)   OB History   Grav Para  Term Preterm Abortions TAB SAB Ect Mult Living                 Review of Systems  Unable to perform ROS Constitutional: Negative for fever.  Respiratory: Negative for shortness of breath.   Cardiovascular: Negative for chest pain and leg swelling.  Gastrointestinal: Negative for nausea.  Genitourinary: Negative for dysuria.  Neurological: Negative for dizziness and headaches.  Psychiatric/Behavioral: Positive for confusion.  All other systems reviewed and are negative.     Allergies  Latex; Nitroglycerin; Ace inhibitors; Meperidine hcl; Other; Tramadol hcl; and Propoxyphene n-acetaminophen  Home Medications   Prior to Admission medications   Medication Sig Start Date  End Date Taking? Authorizing Provider  acetaminophen (TYLENOL) 325 MG tablet Take 650 mg by mouth every 6 (six) hours as needed for mild pain.    Yes Historical Provider, MD  albuterol (PROAIR HFA) 108 (90 BASE) MCG/ACT inhaler Inhale 2 puffs into the lungs 4 (four) times daily. 01/12/13  Yes Angelica Ran, MD  aspirin EC 81 MG tablet Take 81 mg by mouth daily.   Yes Historical Provider, MD  budesonide-formoterol (SYMBICORT) 160-4.5 MCG/ACT inhaler Inhale 2 puffs into the lungs 2 (two) times daily. 08/12/13  Yes Angelica Ran, MD  carvedilol (COREG) 6.25 MG tablet Take 1 tablet (6.25 mg total) by mouth 2 (two) times daily with a meal. 08/19/13  Yes Larey Dresser, MD  esomeprazole (NEXIUM) 40 MG capsule Take 1 capsule (40 mg total) by mouth daily before breakfast. 07/26/13  Yes Angelica Ran, MD  ferrous sulfate 325 (65 FE) MG tablet Take 650 mg by mouth every evening.   Yes Historical Provider, MD  furosemide (LASIX) 40 MG tablet Take 1 tablet (40 mg total) by mouth every other day. 07/31/13  Yes Cordelia Poche, MD  Iloprost 20 MCG/ML SOLN Inhale into the lungs. Ventavis-- 6 times daily - Inhalation 07/09/13  Yes Larey Dresser, MD  potassium chloride SA (K-DUR,KLOR-CON) 20 MEQ tablet Take 2 tablets (40 mEq total) by mouth every other day. When you take Lasix 08/05/13  Yes Larey Dresser, MD  risperiDONE (RISPERDAL) 0.5 MG tablet Take 1 tablet (0.5 mg total) by mouth at bedtime. 01/12/13  Yes Angelica Ran, MD  sildenafil (REVATIO) 20 MG tablet Take 80 mg by mouth 3 (three) times daily. take 4  tablets every 8 hours   Yes Historical Provider, MD   BP 148/78  Pulse 75  Temp(Src) 98.7 F (37.1 C) (Oral)  Resp 16  SpO2 100% Physical Exam  Nursing note and vitals reviewed. Constitutional: She is oriented to person, place, and time. She appears well-developed and well-nourished.  HENT:  Head: Normocephalic.  Eyes: Pupils are equal, round, and reactive to light.  Neck: Normal  range of motion.  Cardiovascular: Normal rate and regular rhythm.   Pulmonary/Chest: Effort normal and breath sounds normal. No respiratory distress.  Uses O2 via  daily  Abdominal: Soft.  Musculoskeletal: Normal range of motion.  Neurological: She is alert and oriented to person, place, and time.  Skin: Skin is warm. No rash noted. No erythema.  Psychiatric: Her speech is normal and behavior is normal. Judgment normal. Thought content is paranoid. Cognition and memory are normal. She exhibits a depressed mood. She expresses no homicidal and no suicidal ideation. She expresses no suicidal plans and no homicidal plans.    ED Course  Procedures (including critical care time) Labs Review Labs Reviewed  CBC WITH DIFFERENTIAL - Abnormal; Notable  for the following:    Hemoglobin 11.6 (*)    HCT 35.8 (*)    Eosinophils Relative 8 (*)    All other components within normal limits  URINALYSIS, ROUTINE W REFLEX MICROSCOPIC - Abnormal; Notable for the following:    APPearance CLOUDY (*)    Leukocytes, UA LARGE (*)    All other components within normal limits  PRO B NATRIURETIC PEPTIDE - Abnormal; Notable for the following:    Pro B Natriuretic peptide (BNP) 272.9 (*)    All other components within normal limits  URINE MICROSCOPIC-ADD ON - Abnormal; Notable for the following:    Squamous Epithelial / LPF FEW (*)    All other components within normal limits  CBG MONITORING, ED - Abnormal; Notable for the following:    Glucose-Capillary 105 (*)    All other components within normal limits  I-STAT CHEM 8, ED - Abnormal; Notable for the following:    Potassium 3.5 (*)    Glucose, Bld 103 (*)    All other components within normal limits  URINE RAPID DRUG SCREEN (HOSP PERFORMED)    Imaging Review No results found.   EKG Interpretation None      MDM  Labs, urine and xray normal will have patient evaluated byTTS Final diagnoses:  Bipolar disorder  Anxiety state, unspecified  COPD  (chronic obstructive pulmonary disease)  GERD (gastroesophageal reflux disease)  Chronic systolic heart failure  Schizophrenia        Garald Balding, NP 08/26/13 1587  Garald Balding, NP 08/30/13 1054

## 2013-08-21 NOTE — ED Notes (Signed)
Per daughter pt because with confusion yesterday, stating this happened 7 years ago and it was a "chemical imbalance"  Pt A & O, stating sometimes her medication may get mixed up and states she is here "because my family feels like I need to be here"

## 2013-08-21 NOTE — ED Notes (Signed)
Bed: AX09 Expected date:  Expected time:  Means of arrival:  Comments: EMS 68F AMS

## 2013-08-21 NOTE — Progress Notes (Signed)
TTS spoke to nurse and explained the process and the need to wait until the psychiatry team arrives.  Nurse indicated that was a good plan as the daughter of the pt has been challenging throughout the night to work with and security was almost needed.  Pt has been cooperative.

## 2013-08-21 NOTE — ED Notes (Signed)
Patient and son both verbalized understanding of discharge instructions

## 2013-08-22 ENCOUNTER — Emergency Department (HOSPITAL_COMMUNITY)
Admission: EM | Admit: 2013-08-22 | Discharge: 2013-08-23 | Disposition: A | Discharge status: Home / Self Care | Payer: Medicare Other | Attending: Emergency Medicine | Admitting: Emergency Medicine

## 2013-08-22 DIAGNOSIS — F411 Generalized anxiety disorder: Secondary | ICD-10-CM | POA: Diagnosis present

## 2013-08-22 DIAGNOSIS — Z79899 Other long term (current) drug therapy: Secondary | ICD-10-CM | POA: Insufficient documentation

## 2013-08-22 DIAGNOSIS — Z91040 Latex allergy status: Secondary | ICD-10-CM | POA: Insufficient documentation

## 2013-08-22 DIAGNOSIS — Z87891 Personal history of nicotine dependence: Secondary | ICD-10-CM | POA: Insufficient documentation

## 2013-08-22 DIAGNOSIS — F919 Conduct disorder, unspecified: Secondary | ICD-10-CM | POA: Insufficient documentation

## 2013-08-22 DIAGNOSIS — R451 Restlessness and agitation: Secondary | ICD-10-CM

## 2013-08-22 DIAGNOSIS — I472 Ventricular tachycardia, unspecified: Secondary | ICD-10-CM | POA: Insufficient documentation

## 2013-08-22 DIAGNOSIS — D509 Iron deficiency anemia, unspecified: Secondary | ICD-10-CM | POA: Insufficient documentation

## 2013-08-22 DIAGNOSIS — IMO0002 Reserved for concepts with insufficient information to code with codable children: Secondary | ICD-10-CM | POA: Insufficient documentation

## 2013-08-22 DIAGNOSIS — I4729 Other ventricular tachycardia: Secondary | ICD-10-CM | POA: Insufficient documentation

## 2013-08-22 DIAGNOSIS — Z8619 Personal history of other infectious and parasitic diseases: Secondary | ICD-10-CM | POA: Insufficient documentation

## 2013-08-22 DIAGNOSIS — F319 Bipolar disorder, unspecified: Secondary | ICD-10-CM

## 2013-08-22 DIAGNOSIS — I10 Essential (primary) hypertension: Secondary | ICD-10-CM | POA: Insufficient documentation

## 2013-08-22 DIAGNOSIS — Z95810 Presence of automatic (implantable) cardiac defibrillator: Secondary | ICD-10-CM | POA: Insufficient documentation

## 2013-08-22 DIAGNOSIS — Z87828 Personal history of other (healed) physical injury and trauma: Secondary | ICD-10-CM | POA: Insufficient documentation

## 2013-08-22 DIAGNOSIS — K219 Gastro-esophageal reflux disease without esophagitis: Secondary | ICD-10-CM | POA: Insufficient documentation

## 2013-08-22 DIAGNOSIS — Z9981 Dependence on supplemental oxygen: Secondary | ICD-10-CM | POA: Insufficient documentation

## 2013-08-22 DIAGNOSIS — Z792 Long term (current) use of antibiotics: Secondary | ICD-10-CM | POA: Insufficient documentation

## 2013-08-22 DIAGNOSIS — Z7982 Long term (current) use of aspirin: Secondary | ICD-10-CM | POA: Insufficient documentation

## 2013-08-22 DIAGNOSIS — Z8742 Personal history of other diseases of the female genital tract: Secondary | ICD-10-CM | POA: Insufficient documentation

## 2013-08-22 DIAGNOSIS — G8929 Other chronic pain: Secondary | ICD-10-CM | POA: Insufficient documentation

## 2013-08-22 DIAGNOSIS — I509 Heart failure, unspecified: Secondary | ICD-10-CM | POA: Insufficient documentation

## 2013-08-22 LAB — CBC WITH DIFFERENTIAL/PLATELET
BASOS ABS: 0 10*3/uL (ref 0.0–0.1)
BASOS PCT: 1 % (ref 0–1)
Eosinophils Absolute: 0.6 10*3/uL (ref 0.0–0.7)
Eosinophils Relative: 7 % — ABNORMAL HIGH (ref 0–5)
HCT: 34.6 % — ABNORMAL LOW (ref 36.0–46.0)
Hemoglobin: 11 g/dL — ABNORMAL LOW (ref 12.0–15.0)
Lymphocytes Relative: 24 % (ref 12–46)
Lymphs Abs: 2 10*3/uL (ref 0.7–4.0)
MCH: 28 pg (ref 26.0–34.0)
MCHC: 31.8 g/dL (ref 30.0–36.0)
MCV: 88 fL (ref 78.0–100.0)
MONO ABS: 0.5 10*3/uL (ref 0.1–1.0)
Monocytes Relative: 7 % (ref 3–12)
Neutro Abs: 5.1 10*3/uL (ref 1.7–7.7)
Neutrophils Relative %: 61 % (ref 43–77)
Platelets: 261 10*3/uL (ref 150–400)
RBC: 3.93 MIL/uL (ref 3.87–5.11)
RDW: 13.8 % (ref 11.5–15.5)
WBC: 8.2 10*3/uL (ref 4.0–10.5)

## 2013-08-22 LAB — BASIC METABOLIC PANEL
BUN: 11 mg/dL (ref 6–23)
CALCIUM: 9.4 mg/dL (ref 8.4–10.5)
CO2: 28 mEq/L (ref 19–32)
Chloride: 100 mEq/L (ref 96–112)
Creatinine, Ser: 0.69 mg/dL (ref 0.50–1.10)
Glucose, Bld: 82 mg/dL (ref 70–99)
Potassium: 3.6 mEq/L — ABNORMAL LOW (ref 3.7–5.3)
SODIUM: 142 meq/L (ref 137–147)

## 2013-08-22 LAB — RAPID URINE DRUG SCREEN, HOSP PERFORMED
Amphetamines: NOT DETECTED
Barbiturates: NOT DETECTED
Benzodiazepines: NOT DETECTED
Cocaine: NOT DETECTED
Opiates: NOT DETECTED
Tetrahydrocannabinol: NOT DETECTED

## 2013-08-22 LAB — ETHANOL: Alcohol, Ethyl (B): <11 mg/dL (ref 0–11)

## 2013-08-22 LAB — SALICYLATE LEVEL

## 2013-08-22 LAB — ACETAMINOPHEN LEVEL

## 2013-08-22 MED ORDER — ALBUTEROL SULFATE HFA 108 (90 BASE) MCG/ACT IN AERS
2.0000 | INHALATION_SPRAY | Freq: Four times a day (QID) | RESPIRATORY_TRACT | Status: DC
  Administered 2013-08-22 – 2013-08-23 (×3): 2 via RESPIRATORY_TRACT
  Filled 2013-08-22 (×4): qty 6.7

## 2013-08-22 MED ORDER — FUROSEMIDE 40 MG PO TABS
40.0000 mg | ORAL_TABLET | ORAL | Status: DC
  Administered 2013-08-22: 40 mg via ORAL
  Filled 2013-08-22: qty 1

## 2013-08-22 MED ORDER — ZOLPIDEM TARTRATE 5 MG PO TABS: 5.0000 mg | ORAL_TABLET | Freq: Every evening | ORAL | Status: DC | PRN

## 2013-08-22 MED ORDER — AZITHROMYCIN 250 MG PO TABS
250.0000 mg | ORAL_TABLET | Freq: Once | ORAL | Status: AC
  Administered 2013-08-22: 250 mg via ORAL
  Filled 2013-08-22: qty 1

## 2013-08-22 MED ORDER — LORAZEPAM 2 MG/ML IJ SOLN
1.0000 mg | Freq: Once | INTRAMUSCULAR | Status: DC
  Filled 2013-08-22: qty 1

## 2013-08-22 MED ORDER — PANTOPRAZOLE SODIUM 40 MG PO TBEC
40.0000 mg | DELAYED_RELEASE_TABLET | Freq: Every day | ORAL | Status: DC
  Administered 2013-08-22 – 2013-08-23 (×2): 40 mg via ORAL
  Filled 2013-08-22 (×2): qty 1

## 2013-08-22 MED ORDER — FERROUS SULFATE 325 (65 FE) MG PO TABS
650.0000 mg | ORAL_TABLET | Freq: Every evening | ORAL | Status: DC
  Administered 2013-08-22: 650 mg via ORAL
  Filled 2013-08-22 (×2): qty 2

## 2013-08-22 MED ORDER — CEPHALEXIN 500 MG PO CAPS
500.0000 mg | ORAL_CAPSULE | Freq: Two times a day (BID) | ORAL | Status: DC
  Administered 2013-08-22 – 2013-08-23 (×3): 500 mg via ORAL
  Filled 2013-08-22 (×2): qty 1

## 2013-08-22 MED ORDER — ONDANSETRON HCL 4 MG PO TABS: 4.0000 mg | ORAL_TABLET | Freq: Three times a day (TID) | ORAL | Status: DC | PRN

## 2013-08-22 MED ORDER — RISPERIDONE 1 MG PO TABS
1.0000 mg | ORAL_TABLET | Freq: Every day | ORAL | Status: DC
  Administered 2013-08-22: 1 mg via ORAL
  Filled 2013-08-22: qty 1

## 2013-08-22 MED ORDER — BUDESONIDE-FORMOTEROL FUMARATE 160-4.5 MCG/ACT IN AERO
2.0000 | INHALATION_SPRAY | Freq: Two times a day (BID) | RESPIRATORY_TRACT | Status: DC
  Administered 2013-08-22 – 2013-08-23 (×2): 2 via RESPIRATORY_TRACT
  Filled 2013-08-22: qty 6

## 2013-08-22 MED ORDER — CARVEDILOL 6.25 MG PO TABS
6.2500 mg | ORAL_TABLET | Freq: Two times a day (BID) | ORAL | Status: DC
  Administered 2013-08-22 – 2013-08-23 (×2): 6.25 mg via ORAL
  Filled 2013-08-22 (×4): qty 1

## 2013-08-22 MED ORDER — ALUM & MAG HYDROXIDE-SIMETH 200-200-20 MG/5ML PO SUSP: 30.0000 mL | ORAL | Status: DC | PRN

## 2013-08-22 MED ORDER — IBUPROFEN 200 MG PO TABS: 600.0000 mg | ORAL_TABLET | Freq: Three times a day (TID) | ORAL | Status: DC | PRN

## 2013-08-22 MED ORDER — NICOTINE 21 MG/24HR TD PT24: 21.0000 mg | MEDICATED_PATCH | Freq: Every day | TRANSDERMAL | Status: DC

## 2013-08-22 MED ORDER — ILOPROST 20 MCG/ML IN SOLN
20.0000 ug | Freq: Four times a day (QID) | RESPIRATORY_TRACT | Status: DC
  Administered 2013-08-22 – 2013-08-23 (×2): 20 ug via RESPIRATORY_TRACT

## 2013-08-22 MED ORDER — AZITHROMYCIN 250 MG PO TABS: 250.0000 mg | ORAL_TABLET | ORAL | Status: DC

## 2013-08-22 MED ORDER — LORAZEPAM 1 MG PO TABS
1.0000 mg | ORAL_TABLET | Freq: Three times a day (TID) | ORAL | Status: DC | PRN
  Administered 2013-08-22: 1 mg via ORAL
  Filled 2013-08-22: qty 1

## 2013-08-22 MED ORDER — POTASSIUM CHLORIDE CRYS ER 20 MEQ PO TBCR
40.0000 meq | EXTENDED_RELEASE_TABLET | ORAL | Status: DC
  Administered 2013-08-22: 40 meq via ORAL
  Filled 2013-08-22: qty 2

## 2013-08-22 MED ORDER — SILDENAFIL CITRATE 20 MG PO TABS
80.0000 mg | ORAL_TABLET | Freq: Three times a day (TID) | ORAL | Status: DC
  Administered 2013-08-22 – 2013-08-23 (×4): 80 mg via ORAL
  Filled 2013-08-22 (×7): qty 4

## 2013-08-22 MED ORDER — ASPIRIN EC 81 MG PO TBEC
81.0000 mg | DELAYED_RELEASE_TABLET | Freq: Every day | ORAL | Status: DC
  Administered 2013-08-22 – 2013-08-23 (×2): 81 mg via ORAL
  Filled 2013-08-22 (×2): qty 1

## 2013-08-22 NOTE — ED Notes (Signed)
Pt son wants to be called if there is any changes and will be calling for updates [phone]

## 2013-08-22 NOTE — ED Notes (Signed)
Called psych assessment dr to see what pt is supposed to do about the meds that she is prescribed but unavailable in our hospital pharmacy. Dr to call back when available

## 2013-08-22 NOTE — Progress Notes (Signed)
Writer attempted to assess the patient but she was given Ativan and was not able to stay awake for the assessment.

## 2013-08-22 NOTE — ED Notes (Signed)
TTS MD called back and stated that he is still workin gon pts charts and he will look over pts meds to order when he is able

## 2013-08-22 NOTE — ED Notes (Signed)
Returned pt son phone call and explained that pt may be in the ED for a few days while waiting for placement at another facility. Pt is on 4L of O2 and unable to go to psych ED. Recommendation from TTS is to transfer to another accepting facility for inpatient. Pt son voiced understanding. Pt son is also supposed to bring pt inhalation med Iloprost that is not available in our pharmacy for pt to use while here.

## 2013-08-22 NOTE — ED Notes (Signed)
Per EMS, Patient was IVC by family, patient was being transported by [facility] when she began having an anxiety attack. Patient arrives with [employer] and family. Patient was seen at doctors office yesterday and was determined to not be taking her Respirdol as ordered. Patient was seen earlier in the night by EMS and was A&Ox4, at this time patient is agitated and anxious, patient was smacking herself in the head on entrance to the ER. Patient very focused on her SPO2 levels.

## 2013-08-22 NOTE — ED Provider Notes (Signed)
CSN: [id number]     Arrival date & time 08/22/13  0518 History   First MD Initiated Contact with Patient 08/22/13 [phone]     Chief Complaint  Patient presents with  . Medical Clearance    IVC by family     (Consider location/radiation/quality/duration/timing/severity/associated sxs/prior Treatment) HPI Comments: Patient is a 53 year old female with history of GERD, hypertension, sarcoidosis, CHF, pulmonary hypertension, anxiety, nonsustained V. tach, psychosis, SVT, hypokalemia, chronic back pain who presents today under IVC paperwork. Her son gives most of the history and they reached him by telephone. He states that she was seen here yesterday and increased her risperidone dose. She was sent home. He is concerned because he feels as though she is getting worse. She has been increasingly more aggressive towards family members. This makes him unable to care for her. She currently denies any suicidal or homicidal ideations, but her son is concerned that she will become suicidal. She has had prior suicide attempts.  The history is provided by the patient and a relative. No language interpreter was used.    Past Medical History  Diagnosis Date  . GERD (gastroesophageal reflux disease)   . Hypertension   . 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 CHF (congestive heart failure)     Right side- Dr. Aundra Dubin ([place])  . Pulmonary HTN   . Anxiety   . Cardiomyopathy   . NSVT (nonsustained ventricular tachycardia)     with syncope: St Jude ICD was implanted. Device now nearing ERI. Given end stage lung disease and normalization of LV function, the device will not be replaced and tachy therapies were turned off  . Chronic chest pain     LHC (08/2008) with no angiographic coronary diease  . Allergic rhinitis   . H/O: iron deficiency anemia   . Secondary amenorrhea     irregular menses  . Psychosis     with high dose steroids  . SVT (supraventricular tachycardia)     possible runs  . Hypokalemia      recurrent  . Rotator cuff sprain   . Chronic back pain    Past Surgical History  Procedure Laterality Date  . Cardiac catheterization  08/26/2008    5% EF with normal coronary arteries and normal wall motion  . Adenosine cardiolyte  03/31/2003    no ischemia/infarct; marked global LV hypekinesis; resting EF 22%  . Cardiac catheterization  03/31/2003    globally depressed LV fxn with EF 20%; idiopathic dilated cardiomyopathy  . Defibrillator placed  03/31/2003    St. Jude single chamber (Dr. Cristopher Peru)  . Pfts      FVC 2000 (56%) FEV1 1400 (47%), ratio 0.7; insignif response to bronchodilatior; stable from 1/05 - 9/05; PFTs: Mod restriction, FVC 1.9L, FEV1 1.6L, both 53% predicted; slt response to brochodilators 04/08/2002   Family History  Problem Relation Age of Onset  . Lung cancer Father   . Cancer Father     lung cancer  . Hypertension    . Diabetes Mother     border line  . Hypertension Mother   . Heart failure Mother    History  Substance Use Topics  . Smoking status: Former Smoker -- 1.50 packs/day for 11 years    Types: Cigarettes    Quit date: 04/08/1992  . Smokeless tobacco: Never Used     Comment: 1ppd x 20 years  . Alcohol Use: Yes     Comment: remote history of alcohol abuse (quit  1994)   OB History   Grav Para Term Preterm Abortions TAB SAB Ect Mult Living                 Review of Systems  Constitutional: Negative for fever and chills.  Respiratory: Negative for shortness of breath.   Cardiovascular: Negative for chest pain.  Psychiatric/Behavioral: Positive for behavioral problems and agitation. Negative for suicidal ideas and hallucinations. The patient is not nervous/anxious.   All other systems reviewed and are negative.     Allergies  Latex; Nitroglycerin; Ace inhibitors; Meperidine hcl; Other; Tramadol hcl; and Propoxyphene n-acetaminophen  Home Medications   Prior to Admission medications   Medication Sig Start Date End Date Taking?  Authorizing Provider  acetaminophen (TYLENOL) 325 MG tablet Take 650 mg by mouth every 6 (six) hours as needed for mild pain (pain/fever).    Yes Historical Provider, MD  albuterol (PROAIR HFA) 108 (90 BASE) MCG/ACT inhaler Inhale 2 puffs into the lungs 4 (four) times daily. 08/21/13  Yes Waylan Boga, NP  aspirin EC 81 MG tablet Take 1 tablet (81 mg total) by mouth daily. 08/21/13  Yes Waylan Boga, NP  azithromycin (ZITHROMAX) 250 MG tablet Take 250-500 mg by mouth as directed. 2 tablets on day 1, then 1 tablet on days 2-5 08/17/13  Yes Historical Provider, MD  budesonide-formoterol (SYMBICORT) 160-4.5 MCG/ACT inhaler Inhale 2 puffs into the lungs 2 (two) times daily. 08/21/13  Yes Waylan Boga, NP  carvedilol (COREG) 6.25 MG tablet Take 1 tablet (6.25 mg total) by mouth 2 (two) times daily with a meal. 08/21/13  Yes Waylan Boga, NP  cephALEXin (KEFLEX) 500 MG capsule Take 1 capsule (500 mg total) by mouth 2 (two) times daily. 08/21/13  Yes Waylan Boga, NP  esomeprazole (NEXIUM) 40 MG capsule Take 1 capsule (40 mg total) by mouth daily before breakfast. 08/21/13  Yes Waylan Boga, NP  ferrous sulfate 325 (65 FE) MG tablet Take 2 tablets (650 mg total) by mouth every evening. 08/21/13  Yes Waylan Boga, NP  furosemide (LASIX) 40 MG tablet Take 1 tablet (40 mg total) by mouth every other day. 08/21/13  Yes Waylan Boga, NP  Iloprost 20 MCG/ML SOLN Inhale into the lungs. Ventavis-- 6 times daily - Inhalation 08/21/13  Yes Waylan Boga, NP  potassium chloride SA (K-DUR,KLOR-CON) 20 MEQ tablet Take 2 tablets (40 mEq total) by mouth every other day. When you take Lasix 08/21/13  Yes Waylan Boga, NP  risperiDONE (RISPERDAL) 1 MG tablet Take 1 tablet (1 mg total) by mouth at bedtime. 08/21/13  Yes Waylan Boga, NP  sildenafil (REVATIO) 20 MG tablet Take 4 tablets (80 mg total) by mouth 3 (three) times daily. take 4  tablets every 8 hours 08/21/13  Yes Waylan Boga, NP   BP 113/54  Pulse 74  Temp(Src) 98 F (36.7  C) (Oral)  Resp 19  SpO2 97% Physical Exam  Nursing note and vitals reviewed. Constitutional: She is oriented to person, place, and time. She appears well-developed and well-nourished. No distress.  HENT:  Head: Normocephalic and atraumatic.  Right Ear: External ear normal.  Left Ear: External ear normal.  Nose: Nose normal.  Mouth/Throat: Oropharynx is clear and moist.  Eyes: Conjunctivae are normal.  Neck: Normal range of motion.  Cardiovascular: Normal rate, regular rhythm and normal heart sounds.   Pulmonary/Chest: Effort normal and breath sounds normal. No stridor. No respiratory distress. She has no wheezes. She has no rales.  Abdominal: Soft. She exhibits no distension.  Musculoskeletal: Normal range of motion.  Neurological: She is alert and oriented to person, place, and time. She has normal strength.  Skin: Skin is warm and dry. She is not diaphoretic. No erythema.  Psychiatric: She has a normal mood and affect. She is agitated. She expresses no homicidal and no suicidal ideation.  Patient is agitated upon son's arrival. She holds and strokes a black cord around her neck, stating "this is so my daughter knows I'm in trouble".     ED Course  Procedures (including critical care time) Labs Review Labs Reviewed  BASIC METABOLIC PANEL - Abnormal; Notable for the following:    Potassium 3.6 (*)    All other components within normal limits  SALICYLATE LEVEL - Abnormal; Notable for the following:    Salicylate Lvl <3.3 (*)    All other components within normal limits  CBC WITH DIFFERENTIAL - Abnormal; Notable for the following:    Hemoglobin 11.0 (*)    HCT 34.6 (*)    Eosinophils Relative 7 (*)    All other components within normal limits  ACETAMINOPHEN LEVEL  ETHANOL  URINE RAPID DRUG SCREEN (HOSP PERFORMED)  CBC WITH DIFFERENTIAL  I-STAT CHEM 8, ED    Imaging Review Dg Chest 2 View  08/21/2013   CLINICAL DATA:  Altered mental status.  Ex-smoker.  Sarcoidosis.   EXAM: CHEST  2 VIEW  COMPARISON:  07/28/2013.  FINDINGS: Enlarged cardiac silhouette with mild improvement. Stable left subclavian AICD lead. Stable hyperexpansion of the lungs and prominent interstitial markings with honeycombing in both upper lobes. Stable superior retraction of the hila. Diffuse osteopenia.  IMPRESSION: 1. No acute abnormality. 2. Stable changes of sarcoidosis superimposed on COPD. 3. Mildly improved cardiomegaly.   Electronically Signed   By: Enrique Sack M.D.   On: 08/21/2013 04:21     EKG Interpretation None      MDM   Final diagnoses:  Bipolar disorder  Agitation   Patient presents to ED for medical clearance. She was seen yesterday and discharged home by psychiatry. Today she is under IVC papers taken out by her son. Concern for increased aggression and inappropriate behavior. On my exam patient is medically cleared. Full AMS work up done yesterday and was unremarkable. Will have psychiatry see and evalaute. Patient is easily redirectable, but does seem to get agitated easily. She adamantly denies SI/HI. Chronically on 4L on oxygen so we will keep in main ED until psych is able to see. Vital signs stable. Patient / Family / Caregiver informed of clinical course, understand medical decision-making process, and agree with plan.    Elwyn Lade, PA-C 08/23/13 [place], PA-C 08/23/13 1158

## 2013-08-22 NOTE — ED Notes (Signed)
RT to give inhaler treatments at this time.

## 2013-08-22 NOTE — Progress Notes (Signed)
Per Dr. Lenna Sciara and Reginold Agent, NP - patient meets criteria for inpatient hospitalization.  No beds at [facility].  The [employer] will refer patient to other facilities.

## 2013-08-22 NOTE — ED Notes (Signed)
Pt started to have shaking in L leg, HR was in 120's and O2 dropped to 88%. Pt talking but not making any sense. Pt agitated and squeezed this writers hand and would not let go. Pt states " when my leg shakes, my daughter is in trouble." Pt is uncooperative and trying to get out f bed. Notified PA to see pt.

## 2013-08-22 NOTE — ED Notes (Signed)
Pt and family requested bathing supplies. Pt was given basin, wash, toothbrush, toothpaste, washcloths, towels and new scrubs. Pt daughter-in-law helping pt with bath and dressing.

## 2013-08-22 NOTE — ED Notes (Signed)
PA at bedside as well as security

## 2013-08-22 NOTE — ED Notes (Signed)
Patient states she can not urinate but will try later

## 2013-08-23 ENCOUNTER — Encounter (HOSPITAL_COMMUNITY): Payer: Self-pay | Admitting: Psychiatry

## 2013-08-23 DIAGNOSIS — F319 Bipolar disorder, unspecified: Secondary | ICD-10-CM

## 2013-08-23 LAB — I-STAT CHEM 8, ED
BUN: 9 mg/dL (ref 6–23)
CALCIUM ION: 1.13 mmol/L (ref 1.12–1.23)
Chloride: 102 mEq/L (ref 96–112)
Creatinine, Ser: 0.8 mg/dL (ref 0.50–1.10)
GLUCOSE: 85 mg/dL (ref 70–99)
HEMATOCRIT: 37 % (ref 36.0–46.0)
HEMOGLOBIN: 12.6 g/dL (ref 12.0–15.0)
Potassium: 3.3 mEq/L — ABNORMAL LOW (ref 3.7–5.3)
Sodium: 142 mEq/L (ref 137–147)
TCO2: 27 mmol/L (ref 0–100)

## 2013-08-23 NOTE — Consult Note (Signed)
[facility] Consult   Reason for Consult:  Behavior changes Referring Physician:  EDP  Rachel [name] is an 53 y.o. female. Total Time spent with patient: 20 minutes  Assessment: AXIS I:   Bipolar disorder AXIS II:  Deferred AXIS III:   Past Medical History  Diagnosis Date  . GERD (gastroesophageal reflux disease)   . Hypertension   . 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 CHF (congestive heart failure)     Right side- Dr. Aundra Dubin ([facility])  . Pulmonary HTN   . Anxiety   . Cardiomyopathy   . NSVT (nonsustained ventricular tachycardia)     with syncope: St Jude ICD was implanted. Device now nearing ERI. Given end stage lung disease and normalization of LV function, the device will not be replaced and tachy therapies were turned off  . Chronic chest pain     LHC (08/2008) with no angiographic coronary diease  . Allergic rhinitis   . H/O: iron deficiency anemia   . Secondary amenorrhea     irregular menses  . Psychosis     with high dose steroids  . SVT (supraventricular tachycardia)     possible runs  . Hypokalemia     recurrent  . Rotator cuff sprain   . Chronic back pain    AXIS IV:  other psychosocial or environmental problems, problems related to social environment and problems with primary support group AXIS V:  61-70 mild symptoms  Plan:  No evidence of imminent risk to self or others at present.  Dr. De Nurse assessed the patient and concurs with the findings.  Subjective:   Rachel [name] is a 53 y.o. female patient does not warrant admission.  HPI:  Patient's behavior has changed at home with multiple confounding factors. She was in the hospital last week for a lung infection, history of sarcoidosis and was placed on prednisone which most likely affected her behavior and her bipolar disorder. [name] also was diagnosed with a UTI on Saturday and started another antibiotic. Her daughter is having a baby tomorrow and she wants to be there for the delivery.  Her friend who was at her bedside will stay with her while her daughter is in the hospital since her daughter lives with her.  [name] wants to discharge and be at the hospital when her daughter has her C-section tomorrow.  Denies suicidal/homicidal ideations and hallucinations.    HPI Elements:   Location:  generalized. Quality:  acute. Severity:  mild. Timing:  intermittent. Duration:  week. Context:  chronic medical issues, lung and UTI infection, Prednisone taper.  Past Psychiatric History: Past Medical History  Diagnosis Date  . GERD (gastroesophageal reflux disease)   . Hypertension   . 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 CHF (congestive heart failure)     Right side- Dr. Aundra Dubin ([facility])  . Pulmonary HTN   . Anxiety   . Cardiomyopathy   . NSVT (nonsustained ventricular tachycardia)     with syncope: St Jude ICD was implanted. Device now nearing ERI. Given end stage lung disease and normalization of LV function, the device will not be replaced and tachy therapies were turned off  . Chronic chest pain     LHC (08/2008) with no angiographic coronary diease  . Allergic rhinitis   . H/O: iron deficiency anemia   . Secondary amenorrhea     irregular menses  . Psychosis     with high  dose steroids  . SVT (supraventricular tachycardia)     possible runs  . Hypokalemia     recurrent  . Rotator cuff sprain   . Chronic back pain     reports that she quit smoking about 21 years ago. Her smoking use included Cigarettes. She has a 16.5 pack-year smoking history. She has never used smokeless tobacco. She reports that she drinks alcohol. She reports that she does not use illicit drugs. Family History  Problem Relation Age of Onset  . Lung cancer Father   . Cancer Father     lung cancer  . Hypertension    . Diabetes Mother     border line  . Hypertension Mother   . Heart failure Mother            Allergies:   Allergies  Allergen Reactions  . Latex Rash  .  Nitroglycerin Other (See Comments)    Patient is on Revatio  . Ace Inhibitors   . Meperidine Hcl Other (See Comments)    REACTION: Makes her feel funny  . Other Other (See Comments)    High Dose Steroids cause chemical imbalance and altered mental status  . Tramadol Hcl Other (See Comments)    REACTION: nausea, lightheadedness, sleepiness, and dizziness  . Propoxyphene N-Acetaminophen Rash    REACTION: rash: pruritus    ACT Assessment Complete:  Yes:    Educational Status    Risk to Self: Risk to self Substance abuse history and/or treatment for substance abuse?: No  Risk to Others:    Abuse:    Prior Inpatient Therapy:    Prior Outpatient Therapy:    Additional Information:                    Objective: Blood pressure 129/78, pulse 86, temperature 98.8 F (37.1 C), temperature source Oral, resp. rate 19, SpO2 94.00%.There is no weight on file to calculate BMI. Results for orders placed during the hospital encounter of 08/22/13 (from the past 72 hour(s))  CBC WITH DIFFERENTIAL     Status: Abnormal   Collection Time    08/22/13  8:30 AM      Result Value Ref Range   WBC 8.2  4.0 - 10.5 K/uL   RBC 3.93  3.87 - 5.11 MIL/uL   Hemoglobin 11.0 (*) 12.0 - 15.0 g/dL   HCT 34.6 (*) 36.0 - 46.0 %   MCV 88.0  78.0 - 100.0 fL   MCH 28.0  26.0 - 34.0 pg   MCHC 31.8  30.0 - 36.0 g/dL   RDW 13.8  11.5 - 15.5 %   Platelets 261  150 - 400 K/uL   Neutrophils Relative % 61  43 - 77 %   Neutro Abs 5.1  1.7 - 7.7 K/uL   Lymphocytes Relative 24  12 - 46 %   Lymphs Abs 2.0  0.7 - 4.0 K/uL   Monocytes Relative 7  3 - 12 %   Monocytes Absolute 0.5  0.1 - 1.0 K/uL   Eosinophils Relative 7 (*) 0 - 5 %   Eosinophils Absolute 0.6  0.0 - 0.7 K/uL   Basophils Relative 1  0 - 1 %   Basophils Absolute 0.0  0.0 - 0.1 K/uL  BASIC METABOLIC PANEL     Status: Abnormal   Collection Time    08/22/13  8:34 AM      Result Value Ref Range   Sodium 142  137 - 147 mEq/L   Potassium  3.6 (*)  3.7 - 5.3 mEq/L   Chloride 100  96 - 112 mEq/L   CO2 28  19 - 32 mEq/L   Glucose, Bld 82  70 - 99 mg/dL   BUN 11  6 - 23 mg/dL   Creatinine, Ser 0.69  0.50 - 1.10 mg/dL   Calcium 9.4  8.4 - 10.5 mg/dL   GFR calc non Af Amer >90  >90 mL/min   GFR calc Af Amer >90  >90 mL/min   Comment: (NOTE)     The eGFR has been calculated using the CKD EPI equation.     This calculation has not been validated in all clinical situations.     eGFR's persistently <90 mL/min signify possible Chronic Kidney     Disease.  SALICYLATE LEVEL     Status: Abnormal   Collection Time    08/22/13  8:34 AM      Result Value Ref Range   Salicylate Lvl <3.9 (*) 2.8 - 20.0 mg/dL  ACETAMINOPHEN LEVEL     Status: None   Collection Time    08/22/13  8:34 AM      Result Value Ref Range   Acetaminophen (Tylenol), Serum <15.0  10 - 30 ug/mL   Comment:            THERAPEUTIC CONCENTRATIONS VARY     SIGNIFICANTLY. A RANGE OF 10-30     ug/mL MAY BE AN EFFECTIVE     CONCENTRATION FOR MANY PATIENTS.     HOWEVER, SOME ARE BEST TREATED     AT CONCENTRATIONS OUTSIDE THIS     RANGE.     ACETAMINOPHEN CONCENTRATIONS     >150 ug/mL AT 4 HOURS AFTER     INGESTION AND >50 ug/mL AT 12     HOURS AFTER INGESTION ARE     OFTEN ASSOCIATED WITH TOXIC     REACTIONS.  ETHANOL     Status: None   Collection Time    08/22/13  8:34 AM      Result Value Ref Range   Alcohol, Ethyl (B) <11  0 - 11 mg/dL   Comment:            LOWEST DETECTABLE LIMIT FOR     SERUM ALCOHOL IS 11 mg/dL     FOR MEDICAL PURPOSES ONLY  URINE RAPID DRUG SCREEN (HOSP PERFORMED)     Status: None   Collection Time    08/22/13  9:48 AM      Result Value Ref Range   Opiates NONE DETECTED  NONE DETECTED   Cocaine NONE DETECTED  NONE DETECTED   Benzodiazepines NONE DETECTED  NONE DETECTED   Amphetamines NONE DETECTED  NONE DETECTED   Tetrahydrocannabinol NONE DETECTED  NONE DETECTED   Barbiturates NONE DETECTED  NONE DETECTED   Comment:            DRUG  SCREEN FOR MEDICAL PURPOSES     ONLY.  IF CONFIRMATION IS NEEDED     FOR ANY PURPOSE, NOTIFY LAB     WITHIN 5 DAYS.                LOWEST DETECTABLE LIMITS     FOR URINE DRUG SCREEN     Drug Class       Cutoff (ng/mL)     Amphetamine      1000     Barbiturate      200     Benzodiazepine   030     Tricyclics  300     Opiates          300     Cocaine          300     THC              50   Labs are reviewed and are pertinent for medical issues being addressed.  Current Facility-Administered Medications  Medication Dose Route Frequency Provider Last Rate Last Dose  . albuterol (PROVENTIL HFA;VENTOLIN HFA) 108 (90 BASE) MCG/ACT inhaler 2 puff  2 puff Inhalation QID Elwyn Lade, PA-C   2 puff at 08/22/13 2110  . alum & mag hydroxide-simeth (MAALOX/MYLANTA) 200-200-20 MG/5ML suspension 30 mL  30 mL Oral PRN Elwyn Lade, PA-C      . aspirin EC tablet 81 mg  81 mg Oral Daily Elwyn Lade, PA-C   81 mg at 08/23/13 1055  . budesonide-formoterol (SYMBICORT) 160-4.5 MCG/ACT inhaler 2 puff  2 puff Inhalation BID Elwyn Lade, PA-C   2 puff at 08/23/13 [phone]  . carvedilol (COREG) tablet 6.25 mg  6.25 mg Oral BID WC Elwyn Lade, PA-C   6.25 mg at 08/23/13 0820  . cephALEXin (KEFLEX) capsule 500 mg  500 mg Oral BID Elwyn Lade, PA-C   500 mg at 08/23/13 1055  . ferrous sulfate tablet 650 mg  650 mg Oral QPM Elwyn Lade, PA-C   650 mg at 08/22/13 1903  . furosemide (LASIX) tablet 40 mg  40 mg Oral QODAY Merryl Hacker, MD   40 mg at 08/22/13 1601  . ibuprofen (ADVIL,MOTRIN) tablet 600 mg  600 mg Oral Q8H PRN Elwyn Lade, PA-C      . Iloprost SOLN 20 mcg  20 mcg Inhalation Q6H Elwyn Lade, PA-C   20 mcg at 08/23/13 5456  . LORazepam (ATIVAN) injection 1 mg  1 mg Intramuscular Once Elwyn Lade, PA-C      . LORazepam (ATIVAN) tablet 1 mg  1 mg Oral Q8H PRN Elwyn Lade, PA-C   1 mg at 08/22/13 1326  . ondansetron (ZOFRAN) tablet 4 mg  4 mg Oral  Q8H PRN Elwyn Lade, PA-C      . pantoprazole (PROTONIX) EC tablet 40 mg  40 mg Oral Daily Elwyn Lade, PA-C   40 mg at 08/23/13 1055  . potassium chloride SA (K-DUR,KLOR-CON) CR tablet 40 mEq  40 mEq Oral QODAY Elwyn Lade, PA-C   40 mEq at 08/22/13 1601  . risperiDONE (RISPERDAL) tablet 1 mg  1 mg Oral QHS Elwyn Lade, PA-C   1 mg at 08/22/13 2140  . sildenafil (REVATIO) tablet 80 mg  80 mg Oral TID Elwyn Lade, PA-C   80 mg at 08/23/13 1054  . zolpidem (AMBIEN) tablet 5 mg  5 mg Oral QHS PRN Elwyn Lade, PA-C       Current Outpatient Prescriptions  Medication Sig Dispense Refill  . acetaminophen (TYLENOL) 325 MG tablet Take 650 mg by mouth every 6 (six) hours as needed for mild pain (pain/fever).       Marland Kitchen albuterol (PROAIR HFA) 108 (90 BASE) MCG/ACT inhaler Inhale 2 puffs into the lungs 4 (four) times daily.  8.5 g  11  . aspirin EC 81 MG tablet Take 1 tablet (81 mg total) by mouth daily.  1 tablet  0  . azithromycin (ZITHROMAX) 250 MG tablet Take 250-500 mg by mouth as directed. 2 tablets on day 1,  then 1 tablet on days 2-5      . budesonide-formoterol (SYMBICORT) 160-4.5 MCG/ACT inhaler Inhale 2 puffs into the lungs 2 (two) times daily.  2 Inhaler  6  . carvedilol (COREG) 6.25 MG tablet Take 1 tablet (6.25 mg total) by mouth 2 (two) times daily with a meal.  60 tablet  0  . cephALEXin (KEFLEX) 500 MG capsule Take 1 capsule (500 mg total) by mouth 2 (two) times daily.  20 capsule  0  . esomeprazole (NEXIUM) 40 MG capsule Take 1 capsule (40 mg total) by mouth daily before breakfast.  90 capsule  3  . ferrous sulfate 325 (65 FE) MG tablet Take 2 tablets (650 mg total) by mouth every evening.  1 tablet  0  . furosemide (LASIX) 40 MG tablet Take 1 tablet (40 mg total) by mouth every other day.  45 tablet  3  . Iloprost 20 MCG/ML SOLN Inhale into the lungs. Ventavis-- 6 times daily - Inhalation  45 mL  2  . potassium chloride SA (K-DUR,KLOR-CON) 20 MEQ tablet Take 2  tablets (40 mEq total) by mouth every other day. When you take Lasix  270 tablet  2  . risperiDONE (RISPERDAL) 1 MG tablet Take 1 tablet (1 mg total) by mouth at bedtime.  30 tablet  0  . sildenafil (REVATIO) 20 MG tablet Take 4 tablets (80 mg total) by mouth 3 (three) times daily. take 4  tablets every 8 hours  10 tablet  0    Psychiatric Specialty Exam:     Blood pressure 129/78, pulse 86, temperature 98.8 F (37.1 C), temperature source Oral, resp. rate 19, SpO2 94.00%.There is no weight on file to calculate BMI.  General Appearance: Casual  Eye Contact::  Good  Speech:  Normal Rate  Volume:  Normal  Mood:  Anxious  Affect:  Congruent  Thought Process:  Coherent  Orientation:  Full (Time, Place, and Person)  Thought Content:  WDL  Suicidal Thoughts:  No  Homicidal Thoughts:  No  Memory:  Immediate;   Good Recent;   Fair Remote;   Fair  Judgement:  Good  Insight:  Good  Psychomotor Activity:  Normal  Concentration:  Good  Recall:  [place] of Knowledge:Good  Language: Good  Akathisia:  No  Handed:  Right  AIMS (if indicated):     Assets:  Desire for Improvement Financial Resources/Insurance Housing Leisure Time Resilience Social Support Transportation  Sleep:      Musculoskeletal: Strength & Muscle Tone: within normal limits Gait & Station: normal Patient leans: N/A  Treatment Plan Summary: Discharge home with follow-up with her regular provider, Risperdal 0.5 at bedtime increased to 1 mg on Saturday.  Waylan Boga, PMH-NP 08/23/2013 11:54 AM I have personally seen the patient and agreed with the findings and involved in the treatment plan. Neighbour agreed to take care of her and patient eagerly wants to be discharged to be with her grandbaby when born. Denying any harmful toughts.  Merian Capron, MD

## 2013-08-23 NOTE — ED Notes (Signed)
Pt called out for assistance states her daughter is having a baby in the next few days and the pt states I am supposed to go home today and I can feel my daughter because we are really close cause she lives with me. Pt began crying and shaking her leg up and down. Visitor walked into room and pt stopped crying immediatly. Pt O2 levels in 80%. O2 was placed back on pt. O2 level back up to 42's Visitor validated pt daughter baby is due tomorrow. Pt anxious.

## 2013-08-23 NOTE — ED Notes (Signed)
Bed: PI95 Expected date:  Expected time:  Means of arrival:  Comments: [place]

## 2013-08-23 NOTE — ED Provider Notes (Signed)
Medical screening examination/treatment/procedure(s) were performed by non-physician practitioner and as supervising physician I was immediately available for consultation/collaboration.   EKG Interpretation None       Merryl Hacker, MD 08/23/13 [phone]

## 2013-08-23 NOTE — ED Notes (Signed)
Patient has two bags of belongings in locker 31.

## 2013-08-23 NOTE — Progress Notes (Signed)
Per discussion with psychiatrist, patient psychiatrically stable for dc home. Patient plans to f/u with outpatient provider.  Pt plans to f[facility]u with [facility] per NP. No further Clinical Social Work needs, signing off.   Belia Heman, [place]  ED CSW 08/23/2013 1142am

## 2013-08-23 NOTE — ED Notes (Signed)
Pt noted to be disoriented at this time. Pt states "I take this medicine at 1200." Pt educated on time of medication due while in the hospital. Pt verbalized understanding and took medication. Pt needed to be redirected multiple times in order to go to the BR with assistance and needed to be redirected returning from BR. Pt attempting to use hallway bed oxygen tanks, refusing to take [place] off before ambulating.

## 2013-08-23 NOTE — ED Notes (Signed)
Charge spoke to [facility], waiting for placement

## 2013-08-23 NOTE — ED Notes (Signed)
Pt's son called at this time. States he may be reached while at work if needed.

## 2013-08-23 NOTE — [facility] Suicide Risk Assessment (Signed)
Suicide Risk Assessment  Discharge Assessment     Demographic Factors:  Unemployed  Total Time spent with patient: 20 minutes Psychiatric Specialty Exam:     Blood pressure 129/78, pulse 86, temperature 98.8 F (37.1 C), temperature source Oral, resp. rate 19, SpO2 94.00%.There is no weight on file to calculate BMI.  General Appearance: Casual  Eye Contact::  Good  Speech:  Normal Rate  Volume:  Normal  Mood:  Anxious  Affect:  Congruent  Thought Process:  Coherent  Orientation:  Full (Time, Place, and Person)  Thought Content:  WDL  Suicidal Thoughts:  No  Homicidal Thoughts:  No  Memory:  Immediate;   Good Recent;   Fair Remote;   Fair  Judgement:  Good  Insight:  Good  Psychomotor Activity:  Normal  Concentration:  Good  Recall:  [place] of Knowledge:Good  Language: Good  Akathisia:  No  Handed:  Right  AIMS (if indicated):     Assets:  Desire for Improvement Financial Resources/Insurance Housing Leisure Time Resilience Social Support Transportation  Sleep:      Musculoskeletal: Strength & Muscle Tone: within normal limits Gait & Station: normal Patient leans: N/A  Mental Status Per Nursing Assessment::   On Admission:   Patient behavior has changed at home with multiple confounding factors.  She was in the hospital last week for a lung infection, history of sarcoidosis and was placed on prednisone which most likely affected her behavior and her bipolar disorder.  Rachel [name] also was diagnosed with a UTI on Saturday and started another antibiotic.  Her daughter is having a baby tomorrow and she wants to be there for the delivery.  Her friend who was at her bedside will stay with her while her daughter is in the hospital since her daughter lives with her.   Current Mental Status by Physician: NA  Loss Factors: NA  Historical Factors: NA  Risk Reduction Factors:   Sense of responsibility to family, Living with another person, especially a relative,  Positive social support, Positive therapeutic relationship and Positive coping skills or problem solving skills  Continued Clinical Symptoms:  Bipolar disorder  Cognitive Features That Contribute To Risk:  None  Suicide Risk:  Minimal: No identifiable suicidal ideation.  Patients presenting with no risk factors but with morbid ruminations; may be classified as minimal risk based on the severity of the depressive symptoms  Discharge Diagnoses:   AXIS I:  Bipolar disorder AXIS II:  Deferred AXIS III:   Past Medical History  Diagnosis Date  . GERD (gastroesophageal reflux disease)   . Hypertension   . 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 CHF (congestive heart failure)     Right side- Dr. Aundra Dubin ([facility])  . Pulmonary HTN   . Anxiety   . Cardiomyopathy   . NSVT (nonsustained ventricular tachycardia)     with syncope: St Jude ICD was implanted. Device now nearing ERI. Given end stage lung disease and normalization of LV function, the device will not be replaced and tachy therapies were turned off  . Chronic chest pain     LHC (08/2008) with no angiographic coronary diease  . Allergic rhinitis   . H/O: iron deficiency anemia   . Secondary amenorrhea     irregular menses  . Psychosis     with high dose steroids  . SVT (supraventricular tachycardia)     possible runs  . Hypokalemia     recurrent  . Rotator  cuff sprain   . Chronic back pain    AXIS IV:  other psychosocial or environmental problems, problems related to social environment and problems with primary support group AXIS V:  61-70 mild symptoms  Plan Of Care/Follow-up recommendations:  Activity:  as tolerated Diet:  low-sodium heart healthy diet  Is patient on multiple antipsychotic therapies at discharge:  No   Has Patient had three or more failed trials of antipsychotic monotherapy by history:  No  Recommended Plan for Multiple Antipsychotic Therapies: NA    Waylan Boga, PMH-NP 08/23/2013, 11:59 AM

## 2013-08-23 NOTE — Progress Notes (Signed)
  CARE MANAGEMENT ED NOTE 08/23/2013  Patient:  Rachel [name], Rachel [name]   Account Number:  [id number]  Date Initiated:  08/23/2013  Documentation initiated by:  Jackelyn Poling  Subjective/Objective Assessment:   53 yr old medicare covered [place] pt IVC by family, transported by [facility] when she began having an anxiety attack.     Subjective/Objective Assessment Detail:   None of family has access to a key to go in her home to get a portable tank to assist with d/c home Pt is active with HHPT only and  is scheduled to be seen 08/23/13 or 08/24/13 per Cyril Mourning of [facility] at 1326     Action/Plan:   1322 ED CM spoke with [facility] to request assistance with a portable tank to d/c home with. Pt to be seen prior to d/c home with family Spoke with Cyril Mourning of [facility] to inform her of pt's d/c to resume HHPT   Action/Plan Detail:   Anticipated DC Date:  08/23/2013     Status Recommendation to Physician:   Result of Recommendation:    Other ED [place]  Other  Outpatient Services - Pt will follow up    Choice offered to / List presented to:  C-1 Patient  DME arranged  [facility]     DME agency  [place] arranged  [place]      [place].    Status of service:  Completed, signed off  ED Comments:   ED Comments Detail:   Follow-up With Details Comments [place] Call to assist with Oxygen and home health physical therapy Scheduled to be seen by PT 08/23/13 or 08/24/13 Please call if you need to reschedule or have concerns [address] [place] [address] [phone]

## 2013-08-23 NOTE — ED Notes (Signed)
Patient wanded by security.

## 2013-08-24 ENCOUNTER — Encounter (HOSPITAL_COMMUNITY): Payer: Self-pay | Admitting: Emergency Medicine

## 2013-08-24 ENCOUNTER — Telehealth: Payer: Self-pay | Admitting: Family Medicine

## 2013-08-24 ENCOUNTER — Emergency Department (HOSPITAL_COMMUNITY)
Admission: EM | Admit: 2013-08-24 | Discharge: 2013-08-24 | Disposition: A | Discharge status: Home / Self Care | Payer: Medicare Other | Attending: Emergency Medicine | Admitting: Emergency Medicine

## 2013-08-24 ENCOUNTER — Inpatient Hospital Stay: Payer: Self-pay | Admitting: Emergency Medicine

## 2013-08-24 DIAGNOSIS — I10 Essential (primary) hypertension: Secondary | ICD-10-CM | POA: Insufficient documentation

## 2013-08-24 DIAGNOSIS — Z862 Personal history of diseases of the blood and blood-forming organs and certain disorders involving the immune mechanism: Secondary | ICD-10-CM | POA: Insufficient documentation

## 2013-08-24 DIAGNOSIS — K219 Gastro-esophageal reflux disease without esophagitis: Secondary | ICD-10-CM | POA: Insufficient documentation

## 2013-08-24 DIAGNOSIS — Z792 Long term (current) use of antibiotics: Secondary | ICD-10-CM | POA: Insufficient documentation

## 2013-08-24 DIAGNOSIS — F319 Bipolar disorder, unspecified: Secondary | ICD-10-CM

## 2013-08-24 DIAGNOSIS — Z7982 Long term (current) use of aspirin: Secondary | ICD-10-CM | POA: Insufficient documentation

## 2013-08-24 DIAGNOSIS — Z9889 Other specified postprocedural states: Secondary | ICD-10-CM | POA: Insufficient documentation

## 2013-08-24 DIAGNOSIS — Z95810 Presence of automatic (implantable) cardiac defibrillator: Secondary | ICD-10-CM | POA: Insufficient documentation

## 2013-08-24 DIAGNOSIS — G8929 Other chronic pain: Secondary | ICD-10-CM | POA: Insufficient documentation

## 2013-08-24 DIAGNOSIS — Z91040 Latex allergy status: Secondary | ICD-10-CM | POA: Insufficient documentation

## 2013-08-24 DIAGNOSIS — I509 Heart failure, unspecified: Secondary | ICD-10-CM | POA: Insufficient documentation

## 2013-08-24 DIAGNOSIS — Z87828 Personal history of other (healed) physical injury and trauma: Secondary | ICD-10-CM | POA: Insufficient documentation

## 2013-08-24 DIAGNOSIS — Z87891 Personal history of nicotine dependence: Secondary | ICD-10-CM | POA: Insufficient documentation

## 2013-08-24 DIAGNOSIS — F411 Generalized anxiety disorder: Secondary | ICD-10-CM | POA: Insufficient documentation

## 2013-08-24 DIAGNOSIS — Z8742 Personal history of other diseases of the female genital tract: Secondary | ICD-10-CM | POA: Insufficient documentation

## 2013-08-24 DIAGNOSIS — Z8619 Personal history of other infectious and parasitic diseases: Secondary | ICD-10-CM | POA: Insufficient documentation

## 2013-08-24 DIAGNOSIS — IMO0002 Reserved for concepts with insufficient information to code with codable children: Secondary | ICD-10-CM | POA: Insufficient documentation

## 2013-08-24 MED ORDER — LORAZEPAM 1 MG PO TABS
1.0000 mg | ORAL_TABLET | Freq: Once | ORAL | Status: AC
  Administered 2013-08-24: 1 mg via ORAL
  Filled 2013-08-24: qty 1

## 2013-08-24 NOTE — ED Notes (Signed)
Assisted patient with bedpan

## 2013-08-24 NOTE — Discharge Instructions (Signed)
Continue taking your prescribed medications as indicated. Followup with an outpatient behavioral health specialist and your primary care provider. Return as needed should symptoms worsen.  Emergency Department Resource Guide 1) Find a Doctor and Pay Out of Pocket Although you won't have to find out who is covered by your insurance plan, it is a good idea to ask around and get recommendations. You will then need to call the office and see if the doctor you have chosen will accept you as a new patient and what types of options they offer for patients who are self-pay. Some doctors offer discounts or will set up payment plans for their patients who do not have insurance, but you will need to ask so you aren't surprised when you get to your appointment.  2) Contact Your Local Health Department Not all health departments have doctors that can see patients for sick visits, but many do, so it is worth a call to see if yours does. If you don't know where your local health department is, you can check in your phone book. The CDC also has a tool to help you locate your state's health department, and many state websites also have listings of all of their local health departments.  3) Find a [facility] If your illness is not likely to be very severe or complicated, you may want to try a walk in clinic. These are popping up all over the country in pharmacies, drugstores, and shopping centers. They're usually staffed by nurse practitioners or physician assistants that have been trained to treat common illnesses and complaints. They're usually fairly quick and inexpensive. However, if you have serious medical issues or chronic medical problems, these are probably not your best option.  No Primary Care Doctor: - Call Health Connect at  [phone] - they can help you locate a primary care doctor that  accepts your insurance, provides certain services, etc. - Physician Referral Service- [phone]  Chronic Pain  Problems: Organization         Address  Phone   Notes  [facility]  [phone] Patients need to be referred by their primary care doctor.   Medication Assistance: Organization         Address  Phone   Notes  [facility] Medication [facility] [address] [place], [place] [address] [phone] --Must be a resident of [facility] -- Must have NO insurance coverage whatsoever (no Medicaid/ Medicare, etc.) -- The pt. MUST have a primary care doctor that directs their care regularly and follows them in the community   Med[facility]ist  [phone]   [employer]  [phone]    Agencies that provide inexpensive medical care: Organization         Address  Phone   Notes  [place]  [phone]   [facility]    [phone]   [facility] [place], [place] [address] [phone]   [place] [address] [place] [phone]   Planned Parenthood    [phone]   [facility]    [phone]   [place] and [place] [address], [place] Phone:  [phone], Fax:  [phone] Hours of Operation:  9 am - 6 pm, M-F.  Also accepts Medicaid/Medicare and self-pay.  [facility] for [place] [address], [employer] Phone: [phone], Fax: [phone]. Hours of Operation:  8:30 am - 5:30 pm, M-F.  Also accepts Medicaid and self-pay.  [facility] [place] [address] [place] Phone: [phone]   [place], [place], [place] [phone], Ext. [phone] Mondays & Thursdays: 7-9 AM.  First 15 patients are seen on a first come, first serve basis.    [place] Providers:  Organization         Address  Phone   Notes  [facility] [address], [place] [phone] Also  accepts self-pay patients.  [facility] [address], [place]  [phone]   [place], [address], [place] [phone]   [facility] [address] [place] [phone]   Lucianne Lei [address], [place]   [phone] Only accepts [place] Access [place] patients after they have their name applied to their card.   Self-Pay (no insurance) in [facility]:  Organization         Address  Phone   Notes  Sickle Cell Patients, [facility] [place] [phone]   [facility] [place] [phone]   [facility] [place]  [place], [place], [place] [phone]   [facility]/Dr. Osei-Bonsu  [address], [place] or [address], [place] [phone] Phone number for both [place] and [place] locations is the same.  Urgent Medical and [facility] [address], [place] [phone]   [facility] [address] [place] or [address] [phone]   [facility] [address] [place] [phone], phone; [phone], fax Sees patients 1st and 3rd Saturday of every month.  Must not qualify for public or private insurance (i.e. Medicaid, Medicare, [place] Health Choice, Veterans' Benefits)  Household income should be no more than 200% of the poverty level The clinic cannot treat you if you are pregnant or think you are pregnant  Sexually transmitted diseases are not treated at the clinic.    Dental Care: Organization         Address  Phone  Notes  [facility] Department of [facility] [place] [phone] Accepts children up to age 85 who are enrolled in [place] or [place]; pregnant  women with a Medicaid card; and children who have applied for Medicaid or [place] Health Choice, but were declined, whose parents can pay a reduced fee at time of service.  [facility] Department of [facility]  [address], [place] [phone] Accepts children up to age 44 who are enrolled in [place] or [place]; pregnant women with a Medicaid card; and children who have applied for Medicaid or [place] Health Choice, but were declined, whose parents can pay a reduced fee at time of service.  [place] Adult Dental Access PROGRAM  [place] [phone] Patients are seen by appointment only. Walk-ins are not accepted. [place] will see patients 58 years of age and older. Monday - Tuesday (8am-5pm) Most Wednesdays (8:30-5pm) $30 per visit, cash only  [place] Adult [facility] Access PROGRAM  [address],  [place] [phone] Patients are seen by appointment only. Walk-ins are not accepted. [place] will see patients 62 years of age and older. One Wednesday Evening (Monthly: Volunteer Based).  $30 per visit, cash only  [place]  [phone] for adults; Children under age 77, call Graduate Pediatric Dentistry at [phone]. Children aged 45-14, please call [phone] to request a pediatric application.  Dental services are provided in all areas of dental care including fillings, crowns and bridges, complete and partial dentures, implants, gum treatment, root canals, and extractions. Preventive care is also provided. Treatment is provided to both adults and children. Patients are selected via a lottery and there is often a waiting list.   [facility] [place] [address], [place]  [phone] [email or web address]   [facility] [address] [place], [place] [phone], Ext. [phone] Second and Fourth Thursday of each month, opens at 6:30 AM; Clinic ends at 9 AM.  Patients are  seen on a first-come first-served basis, and a limited number are seen during each clinic.   [facility]  [address] [place], [place] [phone]   Eligibility Requirements You must have lived in [place], [place], or [place] counties for at least the last three months.   You cannot be eligible for state or federal sponsored [employer], including [employer], [place], or [employer].   You generally cannot be eligible for healthcare insurance through your employer.    How to apply: Eligibility screenings are held every Tuesday and Wednesday afternoon from 1:00 pm until 4:00 pm. You do not need an appointment for the interview!  [facility] [address], [place], [place]   [place]  [place] Department  [place]  [phone]    Behavioral Health Resources in the Community: Intensive Outpatient Programs Organization         Address  Phone  Notes  [facility] [place]. [address] [place], [place] [phone]   [facility] Outpatient [address], [place], [place]   ADS: Alcohol & Drug Svcs [address], [place], [place]   [place] [address]  [place], [place] or [phone]   Substance Abuse Resources Organization         Address  Phone  Notes  Alcohol and Drug Services  [phone]   [place]  [phone]   [facility] [place]   Chinita Pester  [phone]   Residential & Outpatient Substance Abuse Program  [phone]   Psychological Services Organization         Address  Phone  Notes  [facility] [place]  [place]  [phone]   [place] [address] [place] or [phone]    Mobile Crisis  Teams Organization         Address  Phone  Notes  [facility], Mobile Crisis Care Unit  [phone]   Assertive Psychotherapeutic Services  [address] [place], [place]   [place] [address], [place] [phone]    Self-Help/Support Groups Organization         Address  Phone             Notes  [place]. of [place] - variety of support  groups  [phone] Call for more information  Narcotics Anonymous (NA), [facility]aring [facility] [address], [employer] [place]  2 meetings at this location   Residential Facilities manager         Address  Phone  Notes  [facility]idential Treatment [place],    [place]  [phone]   [facility]  [address], [place] [address], [place], [place]   [place], [place] [phone] Admissions: 8am-3pm M-F  Incentives Substance [place] [address],    [place], [place] [phone]   [facility] [address] [place], [place], [place]   [facility] [address],  [place], [place]   [facility] - Intensive Outpatient [address], [place], [place]   [facility] ([place].) [place].,  [place], [place] [phone] or [phone]   [facility]idential Treatment Services (RTS) [address], [place], [place] Accepts Medicaid  [place] [address]  [place] [phone] Substance Abuse/Addiction Treatment   [facility] Organization         Address  Phone  Notes  [facility]  [phone]   Domenic Schwab, PhD [address] [place], [place]   [phone] or [phone]   Joice [place], [place] [phone]   [facility] [address] [place], [place] [phone]  Insurance/Medicaid/sponsorship through [facility] and Families [address]                                    [place], [place] [phone] [place] [address] [place], [place] [phone]    Dr. Adele Schilder  [phone]   [facility] Dept. 1) [address] [place] 2) [place] 3)  [place] [address], [place] [phone]  [phone]   [place] [phone] or [phone] (After Hours)

## 2013-08-24 NOTE — ED Notes (Signed)
Bed: SM27 Expected date:  Expected time:  Means of arrival:  Comments: EMS/medical clearance

## 2013-08-24 NOTE — ED Provider Notes (Signed)
CSN: [id number]     Arrival date & time 08/24/13  0254 History   First MD Initiated Contact with Patient 08/24/13 0303     Chief Complaint  Patient presents with  . Medical Clearance    (Consider location/radiation/quality/duration/timing/severity/associated sxs/prior Treatment) HPI Comments: Patient is a 53 year old female with an extensive PMH including sarcoidosis, CHF, pulmonary hypertension, and NSVT. She has a psychiatric hx of bipolar disorder and anxiety for which she was seen on 08/21/13, and 08/22/13 with discharge yesterday (08/23/13).   Patient states that she returns to the emergency department tonight because she was concerned about change in her psychiatric state. Patient states "I felt like my daughter was going to call EMS for me to come here and be evaluated again, so I called them instead". Patient has not been criteria for inpatient treatment x2. She was initially seen for her psychosis which was thought to be linked to her use of steroids. Patient states she has discontinued all steroids and that she has been compliant with her medications as prescribed. Patient states she feels like herself when she is at baseline. She denies SI/HI, hallucinations, alcohol use, and illicit drug use.  The history is provided by the patient. No language interpreter was used.    Past Medical History  Diagnosis Date  . GERD (gastroesophageal reflux disease)   . Hypertension   . 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 CHF (congestive heart failure)     Right side- Dr. Aundra Dubin ([place])  . Pulmonary HTN   . Anxiety   . Cardiomyopathy   . NSVT (nonsustained ventricular tachycardia)     with syncope: St Jude ICD was implanted. Device now nearing ERI. Given end stage lung disease and normalization of LV function, the device will not be replaced and tachy therapies were turned off  . Chronic chest pain     LHC (08/2008) with no angiographic coronary diease  . Allergic rhinitis   . H/O: iron  deficiency anemia   . Secondary amenorrhea     irregular menses  . Psychosis     with high dose steroids  . SVT (supraventricular tachycardia)     possible runs  . Hypokalemia     recurrent  . Rotator cuff sprain   . Chronic back pain    Past Surgical History  Procedure Laterality Date  . Cardiac catheterization  08/26/2008    5% EF with normal coronary arteries and normal wall motion  . Adenosine cardiolyte  03/31/2003    no ischemia/infarct; marked global LV hypekinesis; resting EF 22%  . Cardiac catheterization  03/31/2003    globally depressed LV fxn with EF 20%; idiopathic dilated cardiomyopathy  . Defibrillator placed  03/31/2003    St. Jude single chamber (Dr. Cristopher Peru)  . Pfts      FVC 2000 (56%) FEV1 1400 (47%), ratio 0.7; insignif response to bronchodilatior; stable from 1/05 - 9/05; PFTs: Mod restriction, FVC 1.9L, FEV1 1.6L, both 53% predicted; slt response to brochodilators 04/08/2002   Family History  Problem Relation Age of Onset  . Lung cancer Father   . Cancer Father     lung cancer  . Hypertension    . Diabetes Mother     border line  . Hypertension Mother   . Heart failure Mother    History  Substance Use Topics  . Smoking status: Former Smoker -- 1.50 packs/day for 11 years    Types: Cigarettes    Quit date: 04/08/1992  .  Smokeless tobacco: Never Used     Comment: 1ppd x 20 years  . Alcohol Use: Yes     Comment: remote history of alcohol abuse (quit 1994)   OB History   Grav Para Term Preterm Abortions TAB SAB Ect Mult Living                  Review of Systems  Psychiatric/Behavioral: Positive for behavioral problems.  All other systems reviewed and are negative.    Allergies  Latex; Nitroglycerin; Ace inhibitors; Meperidine hcl; Other; Tramadol hcl; and Propoxyphene n-acetaminophen  Home Medications   Prior to Admission medications   Medication Sig Start Date End Date Taking? Authorizing Provider  acetaminophen (TYLENOL) 325 MG  tablet Take 650 mg by mouth every 6 (six) hours as needed for mild pain (pain/fever).     Historical Provider, MD  albuterol (PROAIR HFA) 108 (90 BASE) MCG/ACT inhaler Inhale 2 puffs into the lungs 4 (four) times daily. 08/21/13   Waylan Boga, NP  aspirin EC 81 MG tablet Take 1 tablet (81 mg total) by mouth daily. 08/21/13   Waylan Boga, NP  azithromycin (ZITHROMAX) 250 MG tablet Take 250-500 mg by mouth as directed. 2 tablets on day 1, then 1 tablet on days 2-5 08/17/13   Historical Provider, MD  budesonide-formoterol (SYMBICORT) 160-4.5 MCG/ACT inhaler Inhale 2 puffs into the lungs 2 (two) times daily. 08/21/13   Waylan Boga, NP  carvedilol (COREG) 6.25 MG tablet Take 1 tablet (6.25 mg total) by mouth 2 (two) times daily with a meal. 08/21/13   Waylan Boga, NP  cephALEXin (KEFLEX) 500 MG capsule Take 1 capsule (500 mg total) by mouth 2 (two) times daily. 08/21/13   Waylan Boga, NP  esomeprazole (NEXIUM) 40 MG capsule Take 1 capsule (40 mg total) by mouth daily before breakfast. 08/21/13   Waylan Boga, NP  ferrous sulfate 325 (65 FE) MG tablet Take 2 tablets (650 mg total) by mouth every evening. 08/21/13   Waylan Boga, NP  furosemide (LASIX) 40 MG tablet Take 1 tablet (40 mg total) by mouth every other day. 08/21/13   Waylan Boga, NP  Iloprost 20 MCG/ML SOLN Inhale into the lungs. Ventavis-- 6 times daily - Inhalation 08/21/13   Waylan Boga, NP  potassium chloride SA (K-DUR,KLOR-CON) 20 MEQ tablet Take 2 tablets (40 mEq total) by mouth every other day. When you take Lasix 08/21/13   Waylan Boga, NP  risperiDONE (RISPERDAL) 1 MG tablet Take 1 tablet (1 mg total) by mouth at bedtime. 08/21/13   Waylan Boga, NP  sildenafil (REVATIO) 20 MG tablet Take 4 tablets (80 mg total) by mouth 3 (three) times daily. take 4  tablets every 8 hours 08/21/13   Waylan Boga, NP   BP 136/87  Pulse 96  Temp(Src) 98.7 F (37.1 C) (Oral)  Resp 20  SpO2 98%  Physical Exam  Nursing note and vitals  reviewed. Constitutional: She is oriented to person, place, and time. She appears well-developed and well-nourished. No distress.  Nontoxic/nonseptic appearing  HENT:  Head: Normocephalic and atraumatic.  Mouth/Throat: Oropharynx is clear and moist. No oropharyngeal exudate.  Eyes: Conjunctivae and EOM are normal. No scleral icterus.  Neck: Normal range of motion.  Cardiovascular: Normal rate, regular rhythm and normal heart sounds.   Pulmonary/Chest: Effort normal. No respiratory distress. She has no wheezes. She has no rales.  Patient satting 100% on 3L [place]; on chronic O2 at home. Mildly decreased BS diffusely.  Abdominal: Soft. She exhibits no distension. There  is no tenderness.  Musculoskeletal: Normal range of motion.  Neurological: She is alert and oriented to person, place, and time.  GCS 15. Patient moves extremities without ataxia. Speech is goal oriented.  Skin: Skin is warm and dry. No rash noted. She is not diaphoretic. No erythema. No pallor.  Psychiatric: Her speech is normal and behavior is normal. Her mood appears anxious (Mild). Cognition and memory are normal. She expresses no homicidal and no suicidal ideation. She expresses no suicidal plans and no homicidal plans.    ED Course  Procedures (including critical care time) Labs Review Labs Reviewed - No data to display  Imaging Review No results found.   EKG Interpretation None      MDM   Final diagnoses:  Bipolar disorder    Patient is a 53 y/o female with a hx of bipolar d/o. She presents after an encounter at home with her daughter with whom she lives. Patient states "I felt like my daughter was going to call EMS for me to come here and be evaluated again, so I called them instead". In ED, patient is anxious and sporadically tearful. She denies SI/HI, etoh use, and illicit drug use. Patient was d/c'd from the ED 12 hours ago for psychiatric evaluation. This is her third presentation to the ED in 3 days for  psychiatric reasons.  Patient is able to contract for safety in the ED. She denies command hallucinations and homicidal ideations. She states she does not want to harm anyone. I spoke with the patient's daughter who states that patient has been acting out at night and that her actions PTA this evening are not new since her discharge. Past psychiatric notes reviewed and patient's behavior has been attributed to her recently beginning high dose steroids to which she is allergic. Steroids d/c'd on discharge yesterday afternoon.   Patient also evaluated by my attending. Dr. Lita Mains and myself find the patient stable for outpatient psychiatric follow up. Will emphasize discontinuation of steroid medications as well as compliance with psychiatric medications. Resource guide provided. Patient discharged home in good condition. Patient and patient's daughter, via telephone conversation with myself, agree with plan with no unaddressed concerns.   Filed Vitals:   08/24/13 0256  BP: 136/87  Pulse: 96  Temp: 98.7 F (37.1 C)  TempSrc: Oral  Resp: 20  SpO2: 98%       Antonietta Breach, PA-C 08/27/13 1912
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
## 2013-08-24 NOTE — ED Notes (Addendum)
Arnoldo Hooker (sister) number is [phone]. Call anytime please.
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
## 2013-08-24 NOTE — Telephone Encounter (Signed)
I spoke with the son who is very frustrated about the patient's situation. He states that she is becoming more aggressive and believes that she may be a threat to herself or others. He has taken her to the ED several times and even had IVC papers completed at the magistrate, but she has still not been admitted to inpatient behavioral health. He would like to know what else can be done. I stated that he has acted appropriately in all cases. I told him that the physicians who are seeing her in the hospital are the ones with the authority to have her committed and that I would contact the ED physician on his behalf. I called [facility] to speak with the patient and was told that she was discharged this morning.

## 2013-08-24 NOTE — Telephone Encounter (Signed)
I agree the son is acting appropriately by bringing the patient to the ED for evaluation. Unfortunately, there is nothing that can be done if she's being cleared- most likely her behavior is appropriate by the time she's assessed. I would just encourage him to make sure that she does follow up with the psychiatrist and is being managed for the bipolar.

## 2013-08-26 ENCOUNTER — Telehealth: Payer: Self-pay | Admitting: *Deleted

## 2013-08-26 ENCOUNTER — Ambulatory Visit (INDEPENDENT_AMBULATORY_CARE_PROVIDER_SITE_OTHER): Payer: Medicare Other | Admitting: Emergency Medicine

## 2013-08-26 ENCOUNTER — Telehealth: Payer: Self-pay | Admitting: Family Medicine

## 2013-08-26 ENCOUNTER — Encounter: Payer: Self-pay | Admitting: Emergency Medicine

## 2013-08-26 VITALS — BP 158/98 | HR 68 | Ht 63.0 in | Wt 170.4 lb

## 2013-08-26 DIAGNOSIS — D869 Sarcoidosis, unspecified: Secondary | ICD-10-CM

## 2013-08-26 DIAGNOSIS — I2789 Other specified pulmonary heart diseases: Secondary | ICD-10-CM

## 2013-08-26 NOTE — Telephone Encounter (Signed)
Forward to PCP, FYI.Rachel [name]

## 2013-08-26 NOTE — Telephone Encounter (Signed)
Home Health Nurse is calling to inform the doctor that the pt is refusing the home health PT and says that she doesn't not want it in the future. jw

## 2013-08-26 NOTE — Assessment & Plan Note (Signed)
Continue symbicort and high flow O2. Albuterol as needed.

## 2013-08-26 NOTE — Patient Instructions (Addendum)
Please continue your Symbicort 1 puff twice a day Use your albuterol 2 puffs if needed for shortness of breath Continue your oxygen at 4-6L/min Please follow as planned with Dr Aundra Dubin. I agree with trying to change the Ventavis to Tyvaso.  Follow with Dr Lamonte Sakai in 3 months or sooner if you have any problems.

## 2013-08-26 NOTE — Assessment & Plan Note (Signed)
Appears to be stable. Agree with the transition from ventavis to tyvaso. Continue the revatio 80.

## 2013-08-26 NOTE — Progress Notes (Signed)
Subjective:    Patient ID: Rachel [name], female    DOB: 03-26-1961, 53 y.o.   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 She is now on Revatio 80 mg tid and and inhaled Iloprost (added in 2011), managed by Dr Aundra Dubin at [facility]. 6 min walk (7/12) 248 m. RHC (8/12): mean RA 2, PA 51/24, mean PA 35, mean PCWP 6, CI 3, PVR 5.1 WU. She believes that she is doing very well. She wears O2 at 6L/min at all times. She believes she knows when her sarcoidosis is active, remains on flovent.   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 She is now on Revatio 80 mg tid and and inhaled Iloprost (added in 2011). Hasn't has 6 min walk yet. Stopped flovent last time to see if she would miss it.  She is having a bit more exertional SOB w chores, thinks this may be due in part to deconditioning.   CXR today >> improved LLL atx., scattered B infiltrates.   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 She is now on Revatio 80 mg tid and and inhaled Iloprost (added in 2011).  Lately she has been having some increased DOE, some exertional CP. Last 6 minute walk >> never had it yet. Planning to do next month after her ankle is evaluated due to pain when walking (prior fracture).   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 She is now on Revatio 80 mg tid and and inhaled Iloprost.  She has been having nasal congestion and overall more dyspnea for the last . I recommended that she start [place] chlorpheniramine. The pharmacist was worried about this, so instead she got afrin x 3 days.    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 She is now on Revatio 80 mg tid and and inhaled  Ventavis. She was looking into change to Tyvaso, but apparently insurance wouldn't cover it?? Not clear why this was refused.   She was having congestion last time > took levaquin and started Ayr spray, improved. Her breathing is at baseline, but she remains quite limited. She can tell when her volume status is up. Planning for TTE in 8/14. 6 minute walk was performed in April, I don't have the data available. She c/o some frequent bruising.   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 Followed by Dr MAundra Dubin> current PAH meds are Revatio 80 tid + Ventavis. Most recent echocardiogram in 01/07/13 showed estimated PASP 44 mmHg (stable), but decreased EF. Cardiac MRI was discussed to look for infiltrative sarcoidosis, but this has not been done - note she has pacer wires in place. She has some bad days, DOE especially. She uses ProAir with good effect.   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 Followed by Dr MAundra Dubin> current PAH meds are Revatio 80 tid + Ventavis. Most recent echocardiogram in 01/07/13 showed estimated PASP 44 mmHg (stable), decreased EF.  Last time we did a trial of Symbicort > she feels that it has helped some, she is less SOB w exertion, able to do more without  pacing herself. It has made her face and neck more swollen/puffy, she feels that she is eating more.   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 She is on an aggressive PAH regimen. PFT support mixed disease. She benefited from Symbicort but insurance has rejected and says she needs to fail Advair. She has the Advair but hasn't tried it yet.   08/17/13  Acute OV    Complains of prod cough with yellow mucus, increased DOE, wheezing, runny/stuffy nose, HA, PND x3days.  Denies tightness, f/c/s, hemoptysis, nausea, vomiting.  Patient denies any chest pain, rash, joint  swelling. Complains of a postnasal drip, and drainage. No recent travel. Patient was recently admitted last month for possible fluid overload . Symptoms improved with diuresis. She was also given empiric Levaquin for possible underlying pneumonia chest x-ray showed diffuse pulmonary fibrosis, with no acute changes noted  ROV 08/26/13 -- follow up visit for stage 4 sarcoid and PAH, has been on ventavis and sildenifil 80 tid. Her family tells me today that her ventavis has been decreased; Dr Claris Gladden notes do not say this but instead that they are trying to change to Tyvaso (4x a day). The pt has been confused, agitated and was taken to ED this week. They found that she had a UTI which has been treated. The family is concerned that she is not safe to be alone right now. She is scheduled to see a psychologist.      Objective:   Physical Exam Filed Vitals:   08/26/13 1057  BP: 158/98  Pulse: 68  Height: 5' 3" (1.6 m)  Weight: 170 lb 6.4 oz (77.293 kg)  SpO2: 91%    Gen: Pleasant, overwt, in no distress on O2,  normal affect  ENT: No lesions,  mouth clear,  oropharynx clear, no postnasal drip  Neck: No JVD, no TMG, no carotid bruits  Lungs: No use of accessory muscles, few B insp crackles, no wheezing   Cardiovascular: RRR, no m/r/g   Musculoskeletal: No deformities, no cyanosis or clubbing  Neuro: alert, no agitation or confusion noted today.   Skin: Warm, no lesions or rashes   07/28/13 --   Study Conclusions  - Left ventricle: The cavity size was normal. Wall thickness was increased in a pattern of mild LVH. The estimated ejection fraction was 60%. Wall motion was normal; there were no regional wall motion abnormalities. Doppler parameters are consistent with abnormal left ventricular relaxation (grade 1 diastolic dysfunction). - Mitral valve: Flat closure of mitral valve. Trivial regurgitation. - Right ventricle: The cavity size was normal. Pacer wire or catheter noted  in right ventricle. Systolic function was normal. - Right atrium: The atrium was mildly dilated    Assessment & Plan:  PULMONARY HYPERTENSION Appears to be stable. Agree with the transition from ventavis to tyvaso. Continue the revatio 80.   PULMONARY SARCOIDOSIS Continue symbicort and high flow O2. Albuterol as needed.

## 2013-08-26 NOTE — Telephone Encounter (Signed)
APPROVE through [place] for [facility]#[id number] Good through 06/10/2014 Medication will be shipped to patient home [place] # [phone]

## 2013-08-27 ENCOUNTER — Encounter (HOSPITAL_COMMUNITY): Payer: Self-pay | Admitting: Emergency Medicine

## 2013-08-27 ENCOUNTER — Emergency Department (HOSPITAL_COMMUNITY)
Admission: EM | Admit: 2013-08-27 | Discharge: 2013-09-03 | Disposition: A | Discharge status: Home / Self Care | Payer: Medicare Other | Attending: Emergency Medicine | Admitting: Emergency Medicine

## 2013-08-27 ENCOUNTER — Ambulatory Visit: Payer: Self-pay | Admitting: Family Medicine

## 2013-08-27 DIAGNOSIS — Z8619 Personal history of other infectious and parasitic diseases: Secondary | ICD-10-CM | POA: Diagnosis not present

## 2013-08-27 DIAGNOSIS — G8929 Other chronic pain: Secondary | ICD-10-CM | POA: Insufficient documentation

## 2013-08-27 DIAGNOSIS — Z862 Personal history of diseases of the blood and blood-forming organs and certain disorders involving the immune mechanism: Secondary | ICD-10-CM | POA: Diagnosis not present

## 2013-08-27 DIAGNOSIS — Z8709 Personal history of other diseases of the respiratory system: Secondary | ICD-10-CM | POA: Insufficient documentation

## 2013-08-27 DIAGNOSIS — Z91040 Latex allergy status: Secondary | ICD-10-CM | POA: Diagnosis not present

## 2013-08-27 DIAGNOSIS — I509 Heart failure, unspecified: Secondary | ICD-10-CM | POA: Insufficient documentation

## 2013-08-27 DIAGNOSIS — Z9889 Other specified postprocedural states: Secondary | ICD-10-CM | POA: Insufficient documentation

## 2013-08-27 DIAGNOSIS — Z7982 Long term (current) use of aspirin: Secondary | ICD-10-CM | POA: Diagnosis not present

## 2013-08-27 DIAGNOSIS — Z95810 Presence of automatic (implantable) cardiac defibrillator: Secondary | ICD-10-CM | POA: Diagnosis not present

## 2013-08-27 DIAGNOSIS — K219 Gastro-esophageal reflux disease without esophagitis: Secondary | ICD-10-CM | POA: Diagnosis not present

## 2013-08-27 DIAGNOSIS — Z008 Encounter for other general examination: Secondary | ICD-10-CM | POA: Diagnosis present

## 2013-08-27 DIAGNOSIS — F319 Bipolar disorder, unspecified: Secondary | ICD-10-CM | POA: Diagnosis not present

## 2013-08-27 DIAGNOSIS — I10 Essential (primary) hypertension: Secondary | ICD-10-CM | POA: Insufficient documentation

## 2013-08-27 DIAGNOSIS — Z79899 Other long term (current) drug therapy: Secondary | ICD-10-CM | POA: Diagnosis not present

## 2013-08-27 DIAGNOSIS — Z9981 Dependence on supplemental oxygen: Secondary | ICD-10-CM | POA: Diagnosis not present

## 2013-08-27 DIAGNOSIS — Z8742 Personal history of other diseases of the female genital tract: Secondary | ICD-10-CM | POA: Insufficient documentation

## 2013-08-27 DIAGNOSIS — Z792 Long term (current) use of antibiotics: Secondary | ICD-10-CM | POA: Insufficient documentation

## 2013-08-27 DIAGNOSIS — R609 Edema, unspecified: Secondary | ICD-10-CM | POA: Insufficient documentation

## 2013-08-27 DIAGNOSIS — F323 Major depressive disorder, single episode, severe with psychotic features: Secondary | ICD-10-CM | POA: Diagnosis present

## 2013-08-27 DIAGNOSIS — Z87891 Personal history of nicotine dependence: Secondary | ICD-10-CM | POA: Diagnosis not present

## 2013-08-27 DIAGNOSIS — F29 Unspecified psychosis not due to a substance or known physiological condition: Secondary | ICD-10-CM

## 2013-08-27 DIAGNOSIS — F411 Generalized anxiety disorder: Secondary | ICD-10-CM | POA: Diagnosis not present

## 2013-08-27 DIAGNOSIS — Z8639 Personal history of other endocrine, nutritional and metabolic disease: Secondary | ICD-10-CM | POA: Insufficient documentation

## 2013-08-27 LAB — URINE MICROSCOPIC-ADD ON

## 2013-08-27 LAB — COMPREHENSIVE METABOLIC PANEL
ALBUMIN: 3.7 g/dL (ref 3.5–5.2)
ALT: 11 U/L (ref 0–35)
AST: 12 U/L (ref 0–37)
Alkaline Phosphatase: 46 U/L (ref 39–117)
BUN: 14 mg/dL (ref 6–23)
CALCIUM: 9.6 mg/dL (ref 8.4–10.5)
CO2: 28 mEq/L (ref 19–32)
CREATININE: 0.74 mg/dL (ref 0.50–1.10)
Chloride: 99 mEq/L (ref 96–112)
GFR calc Af Amer: >90 mL/min (ref >90)
GFR calc non Af Amer: >90 mL/min (ref >90)
Glucose, Bld: 91 mg/dL (ref 70–99)
Potassium: 3.2 mEq/L — ABNORMAL LOW (ref 3.7–5.3)
Sodium: 142 mEq/L (ref 137–147)
Total Bilirubin: 0.3 mg/dL (ref 0.3–1.2)
Total Protein: 8 g/dL (ref 6.0–8.3)

## 2013-08-27 LAB — RAPID URINE DRUG SCREEN, HOSP PERFORMED
Amphetamines: NOT DETECTED
BARBITURATES: NOT DETECTED
Benzodiazepines: NOT DETECTED
Cocaine: NOT DETECTED
Opiates: NOT DETECTED
TETRAHYDROCANNABINOL: NOT DETECTED

## 2013-08-27 LAB — URINALYSIS, ROUTINE W REFLEX MICROSCOPIC
BILIRUBIN URINE: NEGATIVE
Glucose, UA: NEGATIVE mg/dL
HGB URINE DIPSTICK: NEGATIVE
Ketones, ur: NEGATIVE mg/dL
Nitrite: NEGATIVE
PROTEIN: NEGATIVE mg/dL
Specific Gravity, Urine: 1.028 (ref 1.005–1.030)
UROBILINOGEN UA: 0.2 mg/dL (ref 0.0–1.0)
pH: 6 (ref 5.0–8.0)

## 2013-08-27 LAB — BLOOD GAS, ARTERIAL
Acid-Base Excess: 2.5 mmol/L — ABNORMAL HIGH (ref 0.0–2.0)
Bicarbonate: 27.6 mEq/L — ABNORMAL HIGH (ref 20.0–24.0)
DRAWN BY: 235321
O2 Content: 3 L/min
O2 Saturation: 96 %
PATIENT TEMPERATURE: 98.7
PO2 ART: 87.5 mmHg (ref 80.0–100.0)
TCO2: 25.6 mmol/L (ref 0–100)
pCO2 arterial: 48.2 mmHg — ABNORMAL HIGH (ref 35.0–45.0)
pH, Arterial: 7.377 (ref 7.350–7.450)

## 2013-08-27 LAB — CBC WITH DIFFERENTIAL/PLATELET
BASOS PCT: 1 % (ref 0–1)
Basophils Absolute: 0.1 10*3/uL (ref 0.0–0.1)
EOS PCT: 4 % (ref 0–5)
Eosinophils Absolute: 0.3 10*3/uL (ref 0.0–0.7)
HCT: 32.3 % — ABNORMAL LOW (ref 36.0–46.0)
HEMOGLOBIN: 10.4 g/dL — AB (ref 12.0–15.0)
Lymphocytes Relative: 22 % (ref 12–46)
Lymphs Abs: 1.5 10*3/uL (ref 0.7–4.0)
MCH: 28.1 pg (ref 26.0–34.0)
MCHC: 32.2 g/dL (ref 30.0–36.0)
MCV: 87.3 fL (ref 78.0–100.0)
MONO ABS: 0.5 10*3/uL (ref 0.1–1.0)
MONOS PCT: 7 % (ref 3–12)
Neutro Abs: 4.4 10*3/uL (ref 1.7–7.7)
Neutrophils Relative %: 66 % (ref 43–77)
Platelets: 274 10*3/uL (ref 150–400)
RBC: 3.7 MIL/uL — ABNORMAL LOW (ref 3.87–5.11)
RDW: 13.8 % (ref 11.5–15.5)
WBC: 6.6 10*3/uL (ref 4.0–10.5)

## 2013-08-27 LAB — ETHANOL: Alcohol, Ethyl (B): <11 mg/dL (ref 0–11)

## 2013-08-27 MED ORDER — ASPIRIN EC 81 MG PO TBEC
81.0000 mg | DELAYED_RELEASE_TABLET | Freq: Every day | ORAL | Status: DC
  Administered 2013-08-27 – 2013-09-03 (×8): 81 mg via ORAL
  Filled 2013-08-27 (×10): qty 1

## 2013-08-27 MED ORDER — POTASSIUM CHLORIDE CRYS ER 20 MEQ PO TBCR
40.0000 meq | EXTENDED_RELEASE_TABLET | Freq: Once | ORAL | Status: AC
  Administered 2013-08-27: 40 meq via ORAL
  Filled 2013-08-27: qty 2

## 2013-08-27 MED ORDER — ILOPROST 20 MCG/ML IN SOLN
20.0000 ug | Freq: Every day | RESPIRATORY_TRACT | Status: DC
  Administered 2013-08-29 – 2013-09-02 (×23): 20 ug via RESPIRATORY_TRACT
  Filled 2013-08-27 (×2): qty 1

## 2013-08-27 MED ORDER — BUDESONIDE-FORMOTEROL FUMARATE 160-4.5 MCG/ACT IN AERO
1.0000 | INHALATION_SPRAY | Freq: Two times a day (BID) | RESPIRATORY_TRACT | Status: DC
  Administered 2013-08-27 – 2013-09-03 (×16): 1 via RESPIRATORY_TRACT
  Filled 2013-08-27: qty 6

## 2013-08-27 MED ORDER — PANTOPRAZOLE SODIUM 40 MG PO TBEC
80.0000 mg | DELAYED_RELEASE_TABLET | Freq: Every day | ORAL | Status: DC
  Administered 2013-08-27 – 2013-09-03 (×7): 80 mg via ORAL
  Filled 2013-08-27 (×9): qty 2

## 2013-08-27 MED ORDER — SILDENAFIL CITRATE 20 MG PO TABS
80.0000 mg | ORAL_TABLET | Freq: Three times a day (TID) | ORAL | Status: DC
  Administered 2013-08-27 – 2013-09-03 (×21): 80 mg via ORAL
  Filled 2013-08-27 (×35): qty 4

## 2013-08-27 MED ORDER — RISPERIDONE 1 MG PO TABS
1.0000 mg | ORAL_TABLET | Freq: Every day | ORAL | Status: DC
  Administered 2013-08-27: 1 mg via ORAL
  Filled 2013-08-27: qty 1

## 2013-08-27 MED ORDER — POTASSIUM CHLORIDE CRYS ER 20 MEQ PO TBCR
40.0000 meq | EXTENDED_RELEASE_TABLET | ORAL | Status: DC
  Administered 2013-08-27 – 2013-09-02 (×4): 40 meq via ORAL
  Filled 2013-08-27 (×4): qty 2

## 2013-08-27 MED ORDER — FERROUS SULFATE 325 (65 FE) MG PO TABS
650.0000 mg | ORAL_TABLET | Freq: Every evening | ORAL | Status: DC
  Administered 2013-08-29 – 2013-09-02 (×5): 650 mg via ORAL
  Filled 2013-08-27 (×10): qty 2

## 2013-08-27 MED ORDER — ACETAMINOPHEN 325 MG PO TABS
650.0000 mg | ORAL_TABLET | Freq: Four times a day (QID) | ORAL | Status: DC | PRN
  Administered 2013-09-02 – 2013-09-03 (×2): 650 mg via ORAL
  Filled 2013-08-27 (×2): qty 2

## 2013-08-27 MED ORDER — FUROSEMIDE 40 MG PO TABS
40.0000 mg | ORAL_TABLET | ORAL | Status: DC
  Administered 2013-08-27 – 2013-09-02 (×4): 40 mg via ORAL
  Filled 2013-08-27 (×4): qty 1

## 2013-08-27 MED ORDER — CEPHALEXIN 500 MG PO CAPS
500.0000 mg | ORAL_CAPSULE | Freq: Two times a day (BID) | ORAL | Status: DC
  Administered 2013-08-27 – 2013-09-03 (×16): 500 mg via ORAL
  Filled 2013-08-27 (×16): qty 1

## 2013-08-27 MED ORDER — CARVEDILOL 6.25 MG PO TABS
6.2500 mg | ORAL_TABLET | Freq: Two times a day (BID) | ORAL | Status: DC
  Administered 2013-08-27 – 2013-08-31 (×8): 6.25 mg via ORAL
  Filled 2013-08-27 (×19): qty 1

## 2013-08-27 MED ORDER — ALBUTEROL SULFATE HFA 108 (90 BASE) MCG/ACT IN AERS
2.0000 | INHALATION_SPRAY | Freq: Four times a day (QID) | RESPIRATORY_TRACT | Status: DC
  Administered 2013-08-27 – 2013-09-03 (×26): 2 via RESPIRATORY_TRACT
  Filled 2013-08-27 (×3): qty 6.7

## 2013-08-27 NOTE — ED Notes (Signed)
Patient glasses at bedside, has placed them inside the mirror compartment of her bedside table.

## 2013-08-27 NOTE — Consult Note (Signed)
[facility] Face-to-Face Psychiatry Consult   Reason for Consult:  Psychosis  Referring Physician:  EDP Rachel [name] is an 53 y.o. female. Total Time spent with patient: 20 minutes  Assessment: AXIS I:  Psychotic Disorder NOS AXIS II:  Deferred AXIS III:   Past Medical History  Diagnosis Date  . GERD (gastroesophageal reflux disease)   . Hypertension   . 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 Young  . CHF (congestive heart failure)     Right side- Dr. Mclean ([facility])  . Pulmonary HTN   . Anxiety   . Cardiomyopathy   . NSVT (nonsustained ventricular tachycardia)     with syncope: St Jude ICD was implanted. Device now nearing ERI. Given end stage lung disease and normalization of LV function, the device will not be replaced and tachy therapies were turned off  . Chronic chest pain     LHC (08/2008) with no angiographic coronary diease  . Allergic rhinitis   . H/O: iron deficiency anemia   . Secondary amenorrhea     irregular menses  . Psychosis     with high dose steroids  . SVT (supraventricular tachycardia)     possible runs  . Hypokalemia     recurrent  . Rotator cuff sprain   . Chronic back pain    AXIS IV:  other psychosocial or environmental problems and problems related to social environment AXIS V:  21-30 behavior considerably influenced by delusions or hallucinations OR serious impairment in judgment, communication OR inability to function in almost all areas  Plan:  Recommend psychiatric Inpatient admission when medically cleared.  Dr. Kumar assessed and is agreeable with current plan.  Subjective:   Rachel [name] is a 53 y.o. female patient admitted with acute psychosis  HPI:  Patient presents to the emergency department after becoming confused, wandering away from the home related to paranoia.  Patient gets easily irritated with her children who live with her and help her manage her multiple chronic medical issues.   Patient has difficulties with remembering to take her  medications and has been increasingly paranoid. Psychiatrist feels that patient is not currently safe to be around new baby and other children in the home due to paranoia and increased irritability. At times appears to attend to internal stimuli, typically hears voices.    Family has brought patient into the emergency department three times in the past month.  Patient denies current suicidal ideation/HI.  Currently alert and oriented x 4.  HPI Elements:   Location:  generalized. Quality:  acute. Severity:  severe. Timing:  past month. Duration:  constant. Context:  stressors.  Past Psychiatric History: Past Medical History  Diagnosis Date  . GERD (gastroesophageal reflux disease)   . Hypertension   . 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 Young  . CHF (congestive heart failure)     Right side- Dr. Mclean ([facility])  . Pulmonary HTN   . Anxiety   . Cardiomyopathy   . NSVT (nonsustained ventricular tachycardia)     with syncope: St Jude ICD was implanted. Device now nearing ERI. Given end stage lung disease and normalization of LV function, the device will not be replaced and tachy therapies were turned off  . Chronic chest pain     LHC (08/2008) with no angiographic coronary diease  . Allergic rhinitis   . H/O: iron deficiency anemia   . Secondary amenorrhea     irregular menses  . Psychosis       with high dose steroids  . SVT (supraventricular tachycardia)     possible runs  . Hypokalemia     recurrent  . Rotator cuff sprain   . Chronic back pain     reports that she quit smoking about 21 years ago. Her smoking use included Cigarettes. She has a 16.5 pack-year smoking history. She has never used smokeless tobacco. She reports that she drinks alcohol. She reports that she does not use illicit drugs. Family History  Problem Relation Age of Onset  . Lung cancer Father   . Cancer Father     lung cancer  . Hypertension    . Diabetes Mother     border line  . Hypertension Mother   .  Heart failure Mother    Family History Substance Abuse: No Family Supports: Yes, List: (pt son and daugher ) Living Arrangements: Children Can pt return to current living arrangement?: Yes   Allergies:   Allergies  Allergen Reactions  . Latex Rash  . Nitroglycerin Other (See Comments)    Patient is on Revatio  . Ace Inhibitors     unknown  . Meperidine Hcl Other (See Comments)    REACTION: Makes her feel funny  . Other Other (See Comments)    High Dose Steroids cause chemical imbalance and altered mental status  . Tramadol Hcl Other (See Comments)    REACTION: nausea, lightheadedness, sleepiness, and dizziness  . Propoxyphene N-Acetaminophen Rash    REACTION: rash: pruritus    ACT Assessment Complete:  Yes:    Educational Status    Risk to Self: Risk to self Suicidal Ideation: No Suicidal Intent: No Is patient at risk for suicide?: No Suicidal Plan?: No Access to Means: No What has been your use of drugs/alcohol within the last 12 months?: none Previous Attempts/Gestures: No How many times?: 0 Other Self Harm Risks: no Triggers for Past Attempts: None known Intentional Self Injurious Behavior: None Family Suicide History: No Recent stressful life event(s): Other (Comment) Persecutory voices/beliefs?:  (caring for lots of grandchildren) Depression: No Substance abuse history and/or treatment for substance abuse?: No  Risk to Others: Risk to Others Homicidal Ideation: No Thoughts of Harm to Others: No Current Homicidal Intent: No Current Homicidal Plan: No Access to Homicidal Means: No Identified Victim: n/a History of harm to others?: No Assessment of Violence: None Noted Violent Behavior Description: none Does patient have access to weapons?: No Criminal Charges Pending?: No Does patient have a court date: No  Abuse:    Prior Inpatient Therapy: Prior Inpatient Therapy Prior Inpatient Therapy: Yes Prior Therapy Facilty[facility]Provider[facility]s): [facility]  Reason  for Treatment: psychosis  Prior Outpatient Therapy: Prior Outpatient Therapy Prior Outpatient Therapy: No  Additional Information: Additional Information 1:1 In Past 12 Months?: No CIRT Risk: No Elopement Risk: No Does patient have medical clearance?: Yes                  Objective: Blood pressure 90/51, pulse 71, temperature 99.2 F (37.3 C), temperature source Oral, resp. rate 16, SpO2 97.00%.There is no weight on file to calculate BMI. Results for orders placed during the hospital encounter of 08/27/13 (from the past 72 hour(s))  BLOOD GAS, ARTERIAL     Status: Abnormal   Collection Time    08/27/13  3:45 AM      Result Value Ref Range   O2 Content 3.0     Delivery systems NASAL CANNULA     pH, Arterial 7.377  7.350 - 7.450     pCO2 arterial 48.2 (*) 35.0 - 45.0 mmHg   pO2, Arterial 87.5  80.0 - 100.0 mmHg   Bicarbonate 27.6 (*) 20.0 - 24.0 mEq/L   TCO2 25.6  0 - 100 mmol/L   Acid-Base Excess 2.5 (*) 0.0 - 2.0 mmol/L   O2 Saturation 96.0     Patient temperature 98.7     Collection site RIGHT RADIAL     Drawn by [id number]     Sample type ARTERIAL DRAW     Allens test (pass/fail) PASS  PASS  CBC WITH DIFFERENTIAL     Status: Abnormal   Collection Time    08/27/13  4:04 AM      Result Value Ref Range   WBC 6.6  4.0 - 10.5 K/uL   RBC 3.70 (*) 3.87 - 5.11 MIL/uL   Hemoglobin 10.4 (*) 12.0 - 15.0 g/dL   HCT 32.3 (*) 36.0 - 46.0 %   MCV 87.3  78.0 - 100.0 fL   MCH 28.1  26.0 - 34.0 pg   MCHC 32.2  30.0 - 36.0 g/dL   RDW 13.8  11.5 - 15.5 %   Platelets 274  150 - 400 K/uL   Neutrophils Relative % 66  43 - 77 %   Neutro Abs 4.4  1.7 - 7.7 K/uL   Lymphocytes Relative 22  12 - 46 %   Lymphs Abs 1.5  0.7 - 4.0 K/uL   Monocytes Relative 7  3 - 12 %   Monocytes Absolute 0.5  0.1 - 1.0 K/uL   Eosinophils Relative 4  0 - 5 %   Eosinophils Absolute 0.3  0.0 - 0.7 K/uL   Basophils Relative 1  0 - 1 %   Basophils Absolute 0.1  0.0 - 0.1 K/uL  COMPREHENSIVE METABOLIC  PANEL     Status: Abnormal   Collection Time    08/27/13  4:04 AM      Result Value Ref Range   Sodium 142  137 - 147 mEq/L   Potassium 3.2 (*) 3.7 - 5.3 mEq/L   Chloride 99  96 - 112 mEq/L   CO2 28  19 - 32 mEq/L   Glucose, Bld 91  70 - 99 mg/dL   BUN 14  6 - 23 mg/dL   Creatinine, Ser 0.74  0.50 - 1.10 mg/dL   Calcium 9.6  8.4 - 10.5 mg/dL   Total Protein 8.0  6.0 - 8.3 g/dL   Albumin 3.7  3.5 - 5.2 g/dL   AST 12  0 - 37 U/L   ALT 11  0 - 35 U/L   Alkaline Phosphatase 46  39 - 117 U/L   Total Bilirubin 0.3  0.3 - 1.2 mg/dL   GFR calc non Af Amer >90  >90 mL/min   GFR calc Af Amer >90  >90 mL/min   Comment: (NOTE)     The eGFR has been calculated using the CKD EPI equation.     This calculation has not been validated in all clinical situations.     eGFR's persistently <90 mL/min signify possible Chronic Kidney     Disease.  ETHANOL     Status: None   Collection Time    08/27/13  4:04 AM      Result Value Ref Range   Alcohol, Ethyl (B) <11  0 - 11 mg/dL   Comment:            LOWEST DETECTABLE LIMIT FOR     SERUM ALCOHOL IS 11  mg/dL     FOR MEDICAL PURPOSES ONLY  URINALYSIS, ROUTINE W REFLEX MICROSCOPIC     Status: Abnormal   Collection Time    08/27/13  4:27 AM      Result Value Ref Range   Color, Urine YELLOW  YELLOW   APPearance CLOUDY (*) CLEAR   Specific Gravity, Urine 1.028  1.005 - 1.030   pH 6.0  5.0 - 8.0   Glucose, UA NEGATIVE  NEGATIVE mg/dL   Hgb urine dipstick NEGATIVE  NEGATIVE   Bilirubin Urine NEGATIVE  NEGATIVE   Ketones, ur NEGATIVE  NEGATIVE mg/dL   Protein, ur NEGATIVE  NEGATIVE mg/dL   Urobilinogen, UA 0.2  0.0 - 1.0 mg/dL   Nitrite NEGATIVE  NEGATIVE   Leukocytes, UA MODERATE (*) NEGATIVE  URINE RAPID DRUG SCREEN (HOSP PERFORMED)     Status: None   Collection Time    08/27/13  4:27 AM      Result Value Ref Range   Opiates NONE DETECTED  NONE DETECTED   Cocaine NONE DETECTED  NONE DETECTED   Benzodiazepines NONE DETECTED  NONE DETECTED    Amphetamines NONE DETECTED  NONE DETECTED   Tetrahydrocannabinol NONE DETECTED  NONE DETECTED   Barbiturates NONE DETECTED  NONE DETECTED   Comment:            DRUG SCREEN FOR MEDICAL PURPOSES     ONLY.  IF CONFIRMATION IS NEEDED     FOR ANY PURPOSE, NOTIFY LAB     WITHIN 5 DAYS.                LOWEST DETECTABLE LIMITS     FOR URINE DRUG SCREEN     Drug Class       Cutoff (ng/mL)     Amphetamine      1000     Barbiturate      200     Benzodiazepine   200     Tricyclics       300     Opiates          300     Cocaine          300     THC              50  URINE MICROSCOPIC-ADD ON     Status: None   Collection Time    08/27/13  4:27 AM      Result Value Ref Range   Squamous Epithelial / LPF RARE  RARE   WBC, UA 3-6  <3 WBC/hpf   RBC / HPF 0-2  <3 RBC/hpf   Labs are reviewed and are pertinent for medical issues being addressed  Current Facility-Administered Medications  Medication Dose Route Frequency Provider Last Rate Last Dose  . acetaminophen (TYLENOL) tablet 650 mg  650 mg Oral Q6H PRN Peter S Dammen, PA-C      . albuterol (PROVENTIL HFA;VENTOLIN HFA) 108 (90 BASE) MCG/ACT inhaler 2 puff  2 puff Inhalation QID Peter S Dammen, PA-C   2 puff at 08/27/13 1155  . aspirin EC tablet 81 mg  81 mg Oral Daily Peter S Dammen, PA-C   81 mg at 08/27/13 0940  . budesonide-formoterol (SYMBICORT) 160-4.5 MCG/ACT inhaler 1 puff  1 puff Inhalation BID Peter S Dammen, PA-C   1 puff at 08/27/13 0947  . carvedilol (COREG) tablet 6.25 mg  6.25 mg Oral BID WC Peter S Dammen, PA-C   6.25 mg at 08/27/13 0939  . cephALEXin (  KEFLEX) capsule 500 mg  500 mg Oral BID Peter S Dammen, PA-C   500 mg at 08/27/13 0940  . ferrous sulfate tablet 650 mg  650 mg Oral QPM Peter S Dammen, PA-C      . furosemide (LASIX) tablet 40 mg  40 mg Oral QODAY Peter S Dammen, PA-C   40 mg at 08/27/13 0941  . Iloprost SOLN 20 mcg  20 mcg Inhalation 6 X Daily Peter S Dammen, PA-C      . pantoprazole (PROTONIX) EC tablet 80 mg   80 mg Oral Q1200 Peter S Dammen, PA-C   80 mg at 08/27/13 1153  . potassium chloride SA (K-DUR,KLOR-CON) CR tablet 40 mEq  40 mEq Oral QODAY Peter S Dammen, PA-C   40 mEq at 08/27/13 0941  . risperiDONE (RISPERDAL) tablet 1 mg  1 mg Oral QHS Peter S Dammen, PA-C      . sildenafil (REVATIO) tablet 80 mg  80 mg Oral 3 times per day Peter S Dammen, PA-C   80 mg at 08/27/13 1444   Current Outpatient Prescriptions  Medication Sig Dispense Refill  . acetaminophen (TYLENOL) 325 MG tablet Take 650 mg by mouth every 6 (six) hours as needed for mild pain (pain/fever).       . albuterol (PROAIR HFA) 108 (90 BASE) MCG/ACT inhaler Inhale 2 puffs into the lungs 4 (four) times daily.  8.5 g  11  . aspirin EC 81 MG tablet Take 1 tablet (81 mg total) by mouth daily.  1 tablet  0  . budesonide-formoterol (SYMBICORT) 160-4.5 MCG/ACT inhaler Inhale 1 puff into the lungs 2 (two) times daily.      . carvedilol (COREG) 6.25 MG tablet Take 1 tablet (6.25 mg total) by mouth 2 (two) times daily with a meal.  60 tablet  0  . cephALEXin (KEFLEX) 500 MG capsule Take 1 capsule (500 mg total) by mouth 2 (two) times daily.  20 capsule  0  . esomeprazole (NEXIUM) 40 MG capsule Take 40 mg by mouth every morning.      . ferrous sulfate 325 (65 FE) MG tablet Take 2 tablets (650 mg total) by mouth every evening.  1 tablet  0  . furosemide (LASIX) 40 MG tablet Take 1 tablet (40 mg total) by mouth every other day.  45 tablet  3  . Iloprost 20 MCG/ML SOLN Inhale into the lungs. Ventavis-- 6 times daily - Inhalation  45 mL  2  . potassium chloride SA (K-DUR,KLOR-CON) 20 MEQ tablet Take 2 tablets (40 mEq total) by mouth every other day. When you take Lasix  270 tablet  2  . risperiDONE (RISPERDAL) 1 MG tablet Take 1 tablet (1 mg total) by mouth at bedtime.  30 tablet  0  . sildenafil (REVATIO) 20 MG tablet Take 4 tablets (80 mg total) by mouth 3 (three) times daily. take 4  tablets every 8 hours  10 tablet  0    Psychiatric Specialty  Exam:     Blood pressure 90/51, pulse 71, temperature 99.2 F (37.3 C), temperature source Oral, resp. rate 16, SpO2 97.00%.There is no weight on file to calculate BMI.  General Appearance: Disheveled  Eye Contact::  Minimal  Speech:  Normal Rate  Volume:  Normal  Mood:  Depressed and Irritable  Affect:  Flat  Thought Process:  Disorganized  Orientation:  Full (Time, Place, and Person)  Thought Content:  Delusions, Hallucinations: Auditory Visual and Paranoid Ideation  Appears to attend to internal stimuli   at times   Suicidal Thoughts:  No  Homicidal Thoughts:  No  Memory:  Immediate;   Fair Recent;   Fair Remote;   Fair  Judgement:  Impaired  Insight:  Lacking  Psychomotor Activity:  Normal  Concentration:  Fair  Recall:  [employer]: Good  Akathisia:  No  Handed:  Right  AIMS (if indicated):     Assets:  Housing Resilience Social Support  Sleep:      Musculoskeletal: Strength & Muscle Tone: within normal limits Gait & Station: normal Patient leans: N/A  Treatment Plan Summary: Daily contact with patient to assess and evaluate symptoms and progress in treatment Medication management admit to inpatient psychiatric for stabilization of mood and thought processes   Waylan Boga PMH-NP 08/27/2013 3:07 PM

## 2013-08-27 NOTE — ED Notes (Signed)
Patient awake, very shaky, minimally verbal.  Does not answer questions, taking oxygen off, shaking bedrails.  Patient noted to be tapping her finger on her head and on her face.  Patient does take her medications for nurse.  Son in room, states this is how patient acts at home.

## 2013-08-27 NOTE — ED Notes (Signed)
Bed: KC00 Expected date: 08/27/13 Expected time: 2:12 AM Means of arrival: Ambulance Comments: Medical clearance/O2

## 2013-08-27 NOTE — ED Notes (Signed)
Pt has begun to take off her nasal canula.  Pt redirected and nasal canula was returned to the proper position.

## 2013-08-27 NOTE — BH Assessment (Signed)
Assessment Note  Rachel [name] is an 53 y.o. female who presents to the ED with pt family due to erratic and bizarre behaviors. Pt was found outside wandering in the middle of the night without oxygen. Patient unable to recall events. Patient son states that patient will ask, "Did you hear Jeneen Rinks? Did you hear what he said?" pt son also reprots patient will be referring to a friend being there, who is not present. Pt son reports that she has been physically aggressive to patient daughter and also began acting out at primary care doctor's office. Pt son states that he feels her medications are not workign well. Pt has history of psychosis in 2007 and was started on risperdal however no medicaions changes have been made to adjust mental health needs and patient does not see a psychiatrist at this time. Pt medications are managed by PCP. Pt states, things are going well at home right now. I think my meds need adjusting, and i'm too overhwlemed. Patient helps to care for patient grandchildren and there is a new grandbaby in the home now. During assessment, patient kept covers over her head and had poor eye contact. Patient continues to say her meds need adjusting.    Axis I: Psychotic Disorder NOS Axis II: Deferred Axis III:  Past Medical History  Diagnosis Date  . GERD (gastroesophageal reflux disease)   . Hypertension   . 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 CHF (congestive heart failure)     Right side- Dr. Aundra Dubin ([facility])  . Pulmonary HTN   . Anxiety   . Cardiomyopathy   . NSVT (nonsustained ventricular tachycardia)     with syncope: St Jude ICD was implanted. Device now nearing ERI. Given end stage lung disease and normalization of LV function, the device will not be replaced and tachy therapies were turned off  . Chronic chest pain     LHC (08/2008) with no angiographic coronary diease  . Allergic rhinitis   . H/O: iron deficiency anemia   . Secondary amenorrhea     irregular menses  .  Psychosis     with high dose steroids  . SVT (supraventricular tachycardia)     possible runs  . Hypokalemia     recurrent  . Rotator cuff sprain   . Chronic back pain    Axis IV: other psychosocial or environmental problems, problems related to social environment, problems with access to health care services and problems with primary support group Axis V: 21-30 behavior considerably influenced by delusions or hallucinations OR serious impairment in judgment, communication OR inability to function in almost all areas  Past Medical History:  Past Medical History  Diagnosis Date  . GERD (gastroesophageal reflux disease)   . Hypertension   . 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 CHF (congestive heart failure)     Right side- Dr. Aundra Dubin ([facility])  . Pulmonary HTN   . Anxiety   . Cardiomyopathy   . NSVT (nonsustained ventricular tachycardia)     with syncope: St Jude ICD was implanted. Device now nearing ERI. Given end stage lung disease and normalization of LV function, the device will not be replaced and tachy therapies were turned off  . Chronic chest pain     LHC (08/2008) with no angiographic coronary diease  . Allergic rhinitis   . H/O: iron deficiency anemia   . Secondary amenorrhea     irregular menses  . Psychosis  with high dose steroids  . SVT (supraventricular tachycardia)     possible runs  . Hypokalemia     recurrent  . Rotator cuff sprain   . Chronic back pain     Past Surgical History  Procedure Laterality Date  . Cardiac catheterization  08/26/2008    5% EF with normal coronary arteries and normal wall motion  . Adenosine cardiolyte  03/31/2003    no ischemia/infarct; marked global LV hypekinesis; resting EF 22%  . Cardiac catheterization  03/31/2003    globally depressed LV fxn with EF 20%; idiopathic dilated cardiomyopathy  . Defibrillator placed  03/31/2003    St. Jude single chamber (Dr. Cristopher Peru)  . Pfts      FVC 2000 (56%) FEV1 1400 (47%),  ratio 0.7; insignif response to bronchodilatior; stable from 1/05 - 9/05; PFTs: Mod restriction, FVC 1.9L, FEV1 1.6L, both 53% predicted; slt response to brochodilators 04/08/2002    Family History:  Family History  Problem Relation Age of Onset  . Lung cancer Father   . Cancer Father     lung cancer  . Hypertension    . Diabetes Mother     border line  . Hypertension Mother   . Heart failure Mother     Social History:  reports that she quit smoking about 21 years ago. Her smoking use included Cigarettes. She has a 16.5 pack-year smoking history. She has never used smokeless tobacco. She reports that she drinks alcohol. She reports that she does not use illicit drugs.  Additional Social History:     CIWA: CIWA-Ar BP: 124/74 mmHg Pulse Rate: 88 COWS:    Allergies:  Allergies  Allergen Reactions  . Latex Rash  . Nitroglycerin Other (See Comments)    Patient is on Revatio  . Ace Inhibitors     unknown  . Meperidine Hcl Other (See Comments)    REACTION: Makes her feel funny  . Other Other (See Comments)    High Dose Steroids cause chemical imbalance and altered mental status  . Tramadol Hcl Other (See Comments)    REACTION: nausea, lightheadedness, sleepiness, and dizziness  . Propoxyphene N-Acetaminophen Rash    REACTION: rash: pruritus    Home Medications:  (Not in a hospital admission)  OB/GYN Status:  No LMP recorded. Patient is postmenopausal.  General Assessment Data Location of Assessment: [facility] Is this a Tele or Face-to-Face Assessment?: Face-to-Face Is this an Initial Assessment or a Re-assessment for this encounter?: Initial Assessment Living Arrangements: Children Can pt return to current living arrangement?: Yes Admission Status: Voluntary Is patient capable of signing voluntary admission?: Yes Transfer from: Home Referral Source: Self/Family/Friend     [place] Living Arrangements: Children Name of Psychiatrist: none Name of  Therapist: none  Education Status Is patient currently in school?: No  Risk to self Suicidal Ideation: No Suicidal Intent: No Is patient at risk for suicide?: No Suicidal Plan?: No Access to Means: No What has been your use of drugs/alcohol within the last 12 months?: none Previous Attempts/Gestures: No How many times?: 0 Other Self Harm Risks: no Triggers for Past Attempts: None known Intentional Self Injurious Behavior: None Family Suicide History: No Recent stressful life event(s): Other (Comment) Persecutory voices/beliefs?:  (caring for lots of grandchildren) Depression: No Substance abuse history and/or treatment for substance abuse?: No  Risk to Others Homicidal Ideation: No Thoughts of Harm to Others: No Current Homicidal Intent: No Current Homicidal Plan: No Access to Homicidal Means: No Identified Victim:  n/a History of harm to others?: No Assessment of Violence: None Noted Violent Behavior Description: none Does patient have access to weapons?: No Criminal Charges Pending?: No Does patient have a court date: No  Psychosis Hallucinations: Auditory;Visual Delusions: None noted  Mental Status Report Appear/Hygiene: Disheveled Eye Contact: Fair Motor Activity: Freedom of movement Speech: Logical/coherent Level of Consciousness: Alert Mood: Apprehensive Affect: Appropriate to circumstance Anxiety Level: Minimal Thought Processes: Coherent;Relevant Judgement: Unimpaired Orientation: Person;Place;Time;Situation  Cognitive Functioning Concentration: Normal Memory: Recent Impaired;Remote Impaired IQ: Average Insight: Fair Impulse Control: Poor Appetite: Good Sleep: Decreased Vegetative Symptoms: None  ADLScreening [facility] Assessment Services) Patient's cognitive ability adequate to safely complete daily activities?: Yes Patient able to express need for assistance with ADLs?: Yes Independently performs ADLs?: Yes (appropriate for developmental  age)  Prior Inpatient Therapy Prior Inpatient Therapy: Yes Prior Therapy Facilty/Provider(s[facility]  Reason for Treatment: psychosis  Prior Outpatient Therapy Prior Outpatient Therapy: No  ADL Screening (condition at time of admission) Patient's cognitive ability adequate to safely complete daily activities?: Yes Patient able to express need for assistance with ADLs?: Yes Independently performs ADLs?: Yes (appropriate for developmental age)                  Additional Information 1:1 In Past 12 Months?: No CIRT Risk: No Elopement Risk: No Does patient have medical clearance?: Yes     Disposition:  Disposition Initial Assessment Completed for this Encounter: Yes Disposition of Patient: Referred to  On Site Evaluation by:   Reviewed with Physician:    Edson Snowball 08/27/2013 9:03 AM

## 2013-08-27 NOTE — ED Notes (Signed)
Pt is talking to a blank computer screen that is beside her bed.  Pt is shaking her finger at the computer screen as well.  Pt asked if she needed anything to wish she replied No.

## 2013-08-27 NOTE — ED Notes (Signed)
Bed: WA27 Expected date:  Expected time:  Means of arrival:  Comments: TCU

## 2013-08-27 NOTE — ED Notes (Signed)
Patient family contact info: Claudine Mouton [phone]  Please call prior to discharge. Family is concerned for patient and family safety. Family would like to discuss options for placement in psychiatric inpatient / assisted living placement.

## 2013-08-27 NOTE — ED Notes (Signed)
Patient had several hours during late morning and early afternoon where she appeared to be very appropriate, watching TV, laughing, conversing normally with nurse.  Patient took a nap and upon awakening noted to be minimally verbal again, not answering questions, and tapping her head and face again as she had done early in the a.m.

## 2013-08-27 NOTE — Progress Notes (Signed)
Attempted to secure placement at the following facilities:  [facility]- faxed information [facility]- faxed information [facility]- faxed information [facility]- faxed information [facility]- [facility]- no beds [facility]- only female general  [facility]- only female bed after [place]- no beds [facility]- faxed information [facility]- [facility] only low acuity [facility]- faxed information [facility]- no beds [facility]- left message [facility]- faxed information [facility]- left message   Chesley Noon, MSW, [place], 08/27/2013 Evening Clinical Social Worker [phone]

## 2013-08-27 NOTE — ED Notes (Signed)
Pt has blouse, pants, shirt, NCDL, 2 visa debit cards, [employer] Medicare card, cell phone and cord.     Belongings are in locker 26.  Pt seen and wanded by security.

## 2013-08-27 NOTE — ED Notes (Signed)
Pt asked for and given a glass of water

## 2013-08-27 NOTE — ED Provider Notes (Signed)
CSN: [id number]     Arrival date & time 08/27/13  0230 History   First MD Initiated Contact with Patient 08/27/13 0248     Chief Complaint  Patient presents with  . Medical Clearance   HPI  History provided by the patient, family and recent medical charts. Patient is a 53 year old female with multiple medical conditions including sarcoidosis, pulmonary HTN, hypertension, CHF and bipolar disorder presenting with family for concerns of a rash and on irregular behaviors. Patient's son reports the patient has been acting irrational for the past several days. Other recent trips to the emergency room for similar symptoms however patient has been discharged home he time without any change in her treatments. Early this morning the neighbors knocked on the door to inform the family the patient was outside wandering in the parking lot without her oxygen. She normally wears oxygen 24 7. Patient was acting very anxious and hyperactive. Patient does report recently having some up and downs of her mood occasionally feeling very sad and depressed and laying and not wanting to do anything. She denies SI or HI. At other times she has been very active and family is concerned for her safety.     Past Medical History  Diagnosis Date  . GERD (gastroesophageal reflux disease)   . Hypertension   . 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 CHF (congestive heart failure)     Right side- Dr. Aundra Dubin ([facility])  . Pulmonary HTN   . Anxiety   . Cardiomyopathy   . NSVT (nonsustained ventricular tachycardia)     with syncope: St Jude ICD was implanted. Device now nearing ERI. Given end stage lung disease and normalization of LV function, the device will not be replaced and tachy therapies were turned off  . Chronic chest pain     LHC (08/2008) with no angiographic coronary diease  . Allergic rhinitis   . H/O: iron deficiency anemia   . Secondary amenorrhea     irregular menses  . Psychosis     with high dose steroids   . SVT (supraventricular tachycardia)     possible runs  . Hypokalemia     recurrent  . Rotator cuff sprain   . Chronic back pain    Past Surgical History  Procedure Laterality Date  . Cardiac catheterization  08/26/2008    5% EF with normal coronary arteries and normal wall motion  . Adenosine cardiolyte  03/31/2003    no ischemia/infarct; marked global LV hypekinesis; resting EF 22%  . Cardiac catheterization  03/31/2003    globally depressed LV fxn with EF 20%; idiopathic dilated cardiomyopathy  . Defibrillator placed  03/31/2003    St. Jude single chamber (Dr. Cristopher Peru)  . Pfts      FVC 2000 (56%) FEV1 1400 (47%), ratio 0.7; insignif response to bronchodilatior; stable from 1/05 - 9/05; PFTs: Mod restriction, FVC 1.9L, FEV1 1.6L, both 53% predicted; slt response to brochodilators 04/08/2002   Family History  Problem Relation Age of Onset  . Lung cancer Father   . Cancer Father     lung cancer  . Hypertension    . Diabetes Mother     border line  . Hypertension Mother   . Heart failure Mother    History  Substance Use Topics  . Smoking status: Former Smoker -- 1.50 packs/day for 11 years    Types: Cigarettes    Quit date: 04/08/1992  . Smokeless tobacco: Never Used  Comment: 1ppd x 20 years  . Alcohol Use: Yes     Comment: remote history of alcohol abuse (quit 1994)   OB History   Grav Para Term Preterm Abortions TAB SAB Ect Mult Living                 Review of Systems  All other systems reviewed and are negative.     Allergies  Latex; Nitroglycerin; Ace inhibitors; Meperidine hcl; Other; Tramadol hcl; and Propoxyphene n-acetaminophen  Home Medications   Prior to Admission medications   Medication Sig Start Date End Date Taking? Authorizing Provider  acetaminophen (TYLENOL) 325 MG tablet Take 650 mg by mouth every 6 (six) hours as needed for mild pain (pain/fever).    Yes Historical Provider, MD  albuterol (PROAIR HFA) 108 (90 BASE) MCG/ACT inhaler  Inhale 2 puffs into the lungs 4 (four) times daily. 08/21/13  Yes Waylan Boga, NP  aspirin EC 81 MG tablet Take 1 tablet (81 mg total) by mouth daily. 08/21/13  Yes Waylan Boga, NP  budesonide-formoterol (SYMBICORT) 160-4.5 MCG/ACT inhaler Inhale 1 puff into the lungs 2 (two) times daily.   Yes Historical Provider, MD  carvedilol (COREG) 6.25 MG tablet Take 1 tablet (6.25 mg total) by mouth 2 (two) times daily with a meal. 08/21/13  Yes Waylan Boga, NP  cephALEXin (KEFLEX) 500 MG capsule Take 1 capsule (500 mg total) by mouth 2 (two) times daily. 08/21/13  Yes Waylan Boga, NP  esomeprazole (NEXIUM) 40 MG capsule Take 40 mg by mouth every morning.   Yes Historical Provider, MD  ferrous sulfate 325 (65 FE) MG tablet Take 2 tablets (650 mg total) by mouth every evening. 08/21/13  Yes Waylan Boga, NP  furosemide (LASIX) 40 MG tablet Take 1 tablet (40 mg total) by mouth every other day. 08/21/13  Yes Waylan Boga, NP  Iloprost 20 MCG/ML SOLN Inhale into the lungs. Ventavis-- 6 times daily - Inhalation 08/21/13  Yes Waylan Boga, NP  potassium chloride SA (K-DUR,KLOR-CON) 20 MEQ tablet Take 2 tablets (40 mEq total) by mouth every other day. When you take Lasix 08/21/13  Yes Waylan Boga, NP  risperiDONE (RISPERDAL) 1 MG tablet Take 1 tablet (1 mg total) by mouth at bedtime. 08/21/13  Yes Waylan Boga, NP  sildenafil (REVATIO) 20 MG tablet Take 4 tablets (80 mg total) by mouth 3 (three) times daily. take 4  tablets every 8 hours 08/21/13  Yes Waylan Boga, NP   BP 107/66  Pulse 90  Temp(Src) 98.7 F (37.1 C) (Oral)  Resp 20  SpO2 100% Physical Exam  Nursing note and vitals reviewed. Constitutional: She is oriented to person, place, and time. She appears well-developed and well-nourished. No distress.  HENT:  Head: Normocephalic.  Cardiovascular: Normal rate and regular rhythm.   Pulmonary/Chest: Effort normal and breath sounds normal. No respiratory distress. She has no wheezes.  Abdominal: Soft.   Musculoskeletal: She exhibits edema.  Neurological: She is alert and oriented to person, place, and time.  Skin: Skin is warm and dry. No rash noted.  Psychiatric: Her behavior is normal. Her mood appears anxious. Her speech is rapid and/or pressured. She expresses no homicidal and no suicidal ideation.    ED Course  Procedures   COORDINATION OF CARE:  Nursing notes reviewed. Vital signs reviewed. Initial pt interview and examination performed.   Filed Vitals:   08/27/13 0234  BP: 107/66  Pulse: 90  Temp: 98.7 F (37.1 C)  TempSrc: Oral  Resp: 20  SpO2:  100%    3:39 AM-patient seen and evaluated. Patient has some rapid and pressured speech. Per the family she has been having a rash on her regular behaviors. Was out wandering early this morning without her oxygen. Family has had multiple visits with the patient's emergency room recently for concerns of similar symptoms. She was discharged each time. They're worried that if her symptoms and behavior continues she may accidentally injure herself.  Patient is medically cleared. She is stable for psychiatric evaluation.  psychiatric hold and orders in place.  Results for orders placed during the hospital encounter of 08/27/13  CBC WITH DIFFERENTIAL      Result Value Ref Range   WBC 6.6  4.0 - 10.5 K/uL   RBC 3.70 (*) 3.87 - 5.11 MIL/uL   Hemoglobin 10.4 (*) 12.0 - 15.0 g/dL   HCT 32.3 (*) 36.0 - 46.0 %   MCV 87.3  78.0 - 100.0 fL   MCH 28.1  26.0 - 34.0 pg   MCHC 32.2  30.0 - 36.0 g/dL   RDW 13.8  11.5 - 15.5 %   Platelets 274  150 - 400 K/uL   Neutrophils Relative % 66  43 - 77 %   Neutro Abs 4.4  1.7 - 7.7 K/uL   Lymphocytes Relative 22  12 - 46 %   Lymphs Abs 1.5  0.7 - 4.0 K/uL   Monocytes Relative 7  3 - 12 %   Monocytes Absolute 0.5  0.1 - 1.0 K/uL   Eosinophils Relative 4  0 - 5 %   Eosinophils Absolute 0.3  0.0 - 0.7 K/uL   Basophils Relative 1  0 - 1 %   Basophils Absolute 0.1  0.0 - 0.1 K/uL  COMPREHENSIVE  METABOLIC PANEL      Result Value Ref Range   Sodium 142  137 - 147 mEq/L   Potassium 3.2 (*) 3.7 - 5.3 mEq/L   Chloride 99  96 - 112 mEq/L   CO2 28  19 - 32 mEq/L   Glucose, Bld 91  70 - 99 mg/dL   BUN 14  6 - 23 mg/dL   Creatinine, Ser 0.74  0.50 - 1.10 mg/dL   Calcium 9.6  8.4 - 10.5 mg/dL   Total Protein 8.0  6.0 - 8.3 g/dL   Albumin 3.7  3.5 - 5.2 g/dL   AST 12  0 - 37 U/L   ALT 11  0 - 35 U/L   Alkaline Phosphatase 46  39 - 117 U/L   Total Bilirubin 0.3  0.3 - 1.2 mg/dL   GFR calc non Af Amer >90  >90 mL/min   GFR calc Af Amer >90  >90 mL/min  BLOOD GAS, ARTERIAL      Result Value Ref Range   O2 Content 3.0     Delivery systems NASAL CANNULA     pH, Arterial 7.377  7.350 - 7.450   pCO2 arterial 48.2 (*) 35.0 - 45.0 mmHg   pO2, Arterial 87.5  80.0 - 100.0 mmHg   Bicarbonate 27.6 (*) 20.0 - 24.0 mEq/L   TCO2 25.6  0 - 100 mmol/L   Acid-Base Excess 2.5 (*) 0.0 - 2.0 mmol/L   O2 Saturation 96.0     Patient temperature 98.7     Collection site RIGHT RADIAL     Drawn by [id number]     Sample type ARTERIAL DRAW     Allens test (pass/fail) PASS  PASS  URINALYSIS, ROUTINE W REFLEX MICROSCOPIC  Result Value Ref Range   Color, Urine YELLOW  YELLOW   APPearance CLOUDY (*) CLEAR   Specific Gravity, Urine 1.028  1.005 - 1.030   pH 6.0  5.0 - 8.0   Glucose, UA NEGATIVE  NEGATIVE mg/dL   Hgb urine dipstick NEGATIVE  NEGATIVE   Bilirubin Urine NEGATIVE  NEGATIVE   Ketones, ur NEGATIVE  NEGATIVE mg/dL   Protein, ur NEGATIVE  NEGATIVE mg/dL   Urobilinogen, UA 0.2  0.0 - 1.0 mg/dL   Nitrite NEGATIVE  NEGATIVE   Leukocytes, UA MODERATE (*) NEGATIVE  URINE RAPID DRUG SCREEN (HOSP PERFORMED)      Result Value Ref Range   Opiates NONE DETECTED  NONE DETECTED   Cocaine NONE DETECTED  NONE DETECTED   Benzodiazepines NONE DETECTED  NONE DETECTED   Amphetamines NONE DETECTED  NONE DETECTED   Tetrahydrocannabinol NONE DETECTED  NONE DETECTED   Barbiturates NONE DETECTED  NONE DETECTED   ETHANOL      Result Value Ref Range   Alcohol, Ethyl (B) <11  0 - 11 mg/dL  URINE MICROSCOPIC-ADD ON      Result Value Ref Range   Squamous Epithelial / LPF RARE  RARE   WBC, UA 3-6  <3 WBC/hpf   RBC / HPF 0-2  <3 RBC/hpf      MDM   Final diagnoses:  Bipolar disorder        Martie Lee, PA-C 08/27/13 [phone]

## 2013-08-27 NOTE — ED Notes (Signed)
Patient refusing to take her medications, refusing to wear her oxygen.  Wants to see her daughter, daughter called by RN.

## 2013-08-27 NOTE — ED Notes (Signed)
Son reports pt is having some erratic behaviors recently, was just evaluated here Sunday.

## 2013-08-27 NOTE — ED Notes (Signed)
Pt. Urinated on herself on the bed and all over floor. Pt. Was cleaned up by techs [facility] and put back to bed.

## 2013-08-27 NOTE — ED Notes (Addendum)
Patient states her family say she's behaving strangely. Patient states she doesn't really feel like herself as her health has began to decline. Patient states she finds herself becoming increasingly frustrated with her loved ones as they expect her to behave in the same manner she always has. 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 Patient states she is happy with life and the people in it, just often overwhelmed. Patient son at bedside portrays a drastically different scenario, states patient has become verbally aggressive to people, including staff at her physicians office and her best friend. Patient son states he woke in the middle of the night to neighbors in the home, the front door was standing open and his mother was "a long ways from the house, standing beside a car, without her oxygen". Patient states when he attempts to question his mother about her erratic behavior she says "just leave me alone". Patient calm and cooperative during assessment. When patient is questioned about being outside without her oxygen tonight she states "I really dont know what happened, I can't remember".

## 2013-08-27 NOTE — ED Notes (Signed)
Pt accepted her medications.  Pt asked what each medication was then took them.

## 2013-08-27 NOTE — ED Notes (Signed)
Pt asked dif she needed the bathroom.  Pt informed that she has not gone to the toilet since before 1900 hrs.  Pt states she would go.  Female tech used to assist her in toileting.

## 2013-08-28 ENCOUNTER — Encounter (HOSPITAL_COMMUNITY): Payer: Self-pay | Admitting: Psychiatry

## 2013-08-28 MED ORDER — HYDROXYZINE HCL 25 MG PO TABS
25.0000 mg | ORAL_TABLET | Freq: Four times a day (QID) | ORAL | Status: DC | PRN
  Administered 2013-08-28 – 2013-08-30 (×4): 25 mg via ORAL
  Filled 2013-08-28 (×4): qty 1

## 2013-08-28 MED ORDER — OLANZAPINE 10 MG PO TBDP
10.0000 mg | ORAL_TABLET | Freq: Once | ORAL | Status: AC
  Administered 2013-08-28: 10 mg via ORAL
  Filled 2013-08-28: qty 1

## 2013-08-28 MED ORDER — RISPERIDONE 1 MG PO TABS
1.0000 mg | ORAL_TABLET | Freq: Two times a day (BID) | ORAL | Status: DC
  Administered 2013-08-28 – 2013-08-31 (×7): 1 mg via ORAL
  Filled 2013-08-28 (×7): qty 1

## 2013-08-28 NOTE — ED Notes (Signed)
Patient tearful at shift change again.  Reports that she "hurts all over" and that she is nauseated.  Also reports that she is hearing chains rattling.

## 2013-08-28 NOTE — Consult Note (Signed)
[facility] Consult   Reason for Consult:  Psychosis  Referring Physician:  EDP BRETTANY [name] is an 53 y.o. female. Total Time spent with patient: 15 minutes  Assessment: AXIS I:  Psychotic Disorder NOS AXIS II:  Deferred AXIS III:   Past Medical History  Diagnosis Date  . GERD (gastroesophageal reflux disease)   . Hypertension   . 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 CHF (congestive heart failure)     Right side- Dr. Aundra Dubin ([place])  . Pulmonary HTN   . Anxiety   . Cardiomyopathy   . NSVT (nonsustained ventricular tachycardia)     with syncope: St Jude ICD was implanted. Device now nearing ERI. Given end stage lung disease and normalization of LV function, the device will not be replaced and tachy therapies were turned off  . Chronic chest pain     LHC (08/2008) with no angiographic coronary diease  . Allergic rhinitis   . H/O: iron deficiency anemia   . Secondary amenorrhea     irregular menses  . Psychosis     with high dose steroids  . SVT (supraventricular tachycardia)     possible runs  . Hypokalemia     recurrent  . Rotator cuff sprain   . Chronic back pain    AXIS IV:  other psychosocial or environmental problems and problems related to social environment AXIS V:  21-30 behavior considerably influenced by delusions or hallucinations OR serious impairment in judgment, communication OR inability to function in almost all areas  Plan:  Recommend psychiatric Inpatient admission when medically cleared.  Dr. Louretta Shorten assessed and is agreeable with current plan.  Subjective:   Rachel [name] is a 53 y.o. female patient admitted with acute psychosis  HPI:  Patient's anxiety and agitation have increased today to the point of needing a PRN Zyprexa, Vistaril 25 mg every six hours PRN anxiety also added.  Rachel [name] continues to be confused and requires inpatient hospitalization to monitor her medications and mood stability.  Remains unsafe to return home.   She states "I'm not myself."  Psychiatrist asked what he could do for her, "Get me some help."  Rachel [name] states she cries one minute then she's not.  "I am always asking someone to help me do something when I can do it myself."  Appears to be responding to internal stimuli.  HPI Elements:   Location:  generalized. Quality:  acute. Severity:  severe. Timing:  past month. Duration:  constant. Context:  stressors.  Past Psychiatric History: Past Medical History  Diagnosis Date  . GERD (gastroesophageal reflux disease)   . Hypertension   . 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 CHF (congestive heart failure)     Right side- Dr. Aundra Dubin ([place])  . Pulmonary HTN   . Anxiety   . Cardiomyopathy   . NSVT (nonsustained ventricular tachycardia)     with syncope: St Jude ICD was implanted. Device now nearing ERI. Given end stage lung disease and normalization of LV function, the device will not be replaced and tachy therapies were turned off  . Chronic chest pain     LHC (08/2008) with no angiographic coronary diease  . Allergic rhinitis   . H/O: iron deficiency anemia   . Secondary amenorrhea     irregular menses  . Psychosis     with high dose steroids  . SVT (supraventricular tachycardia)     possible runs  .  Hypokalemia     recurrent  . Rotator cuff sprain   . Chronic back pain     reports that she quit smoking about 21 years ago. Her smoking use included Cigarettes. She has a 16.5 pack-year smoking history. She has never used smokeless tobacco. She reports that she drinks alcohol. She reports that she does not use illicit drugs. Family History  Problem Relation Age of Onset  . Lung cancer Father   . Cancer Father     lung cancer  . Hypertension    . Diabetes Mother     border line  . Hypertension Mother   . Heart failure Mother    Family History Substance Abuse: No Family Supports: Yes, List: (pt son and daugher ) Living Arrangements: Children Can pt return to current  living arrangement?: Yes   Allergies:   Allergies  Allergen Reactions  . Latex Rash  . Nitroglycerin Other (See Comments)    Patient is on Revatio  . Ace Inhibitors     unknown  . Meperidine Hcl Other (See Comments)    REACTION: Makes her feel funny  . Other Other (See Comments)    High Dose Steroids cause chemical imbalance and altered mental status  . Tramadol Hcl Other (See Comments)    REACTION: nausea, lightheadedness, sleepiness, and dizziness  . Propoxyphene N-Acetaminophen Rash    REACTION: rash: pruritus    ACT Assessment Complete:  Yes:    Educational Status    Risk to Self: Risk to self Suicidal Ideation: No Suicidal Intent: No Is patient at risk for suicide?: No Suicidal Plan?: No Access to Means: No What has been your use of drugs/alcohol within the last 12 months?: none Previous Attempts/Gestures: No How many times?: 0 Other Self Harm Risks: no Triggers for Past Attempts: None known Intentional Self Injurious Behavior: None Family Suicide History: No Recent stressful life event(s): Other (Comment) Persecutory voices/beliefs?:  (caring for lots of grandchildren) Depression: No Substance abuse history and/or treatment for substance abuse?: No  Risk to Others: Risk to Others Homicidal Ideation: No Thoughts of Harm to Others: No Current Homicidal Intent: No Current Homicidal Plan: No Access to Homicidal Means: No Identified Victim: n/a History of harm to others?: No Assessment of Violence: None Noted Violent Behavior Description: none Does patient have access to weapons?: No Criminal Charges Pending?: No Does patient have a court date: No  Abuse:    Prior Inpatient Therapy: Prior Inpatient Therapy Prior Inpatient Therapy: Yes Prior Therapy [facility]Provider[facility]  Reason for Treatment: psychosis  Prior Outpatient Therapy: Prior Outpatient Therapy Prior Outpatient Therapy: No  Additional Information: Additional Information 1:1 In Past 12  Months?: No CIRT Risk: No Elopement Risk: No Does patient have medical clearance?: Yes                  Objective: Blood pressure 134/70, pulse 103, temperature 99 F (37.2 C), temperature source Oral, resp. rate 20, SpO2 98.00%.There is no weight on file to calculate BMI. Results for orders placed during the hospital encounter of 08/27/13 (from the past 72 hour(s))  BLOOD GAS, ARTERIAL     Status: Abnormal   Collection Time    08/27/13  3:45 AM      Result Value Ref Range   O2 Content 3.0     Delivery systems NASAL CANNULA     pH, Arterial 7.377  7.350 - 7.450   pCO2 arterial 48.2 (*) 35.0 - 45.0 mmHg   pO2, Arterial 87.5  80.0 -  100.0 mmHg   Bicarbonate 27.6 (*) 20.0 - 24.0 mEq/L   TCO2 25.6  0 - 100 mmol/L   Acid-Base Excess 2.5 (*) 0.0 - 2.0 mmol/L   O2 Saturation 96.0     Patient temperature 98.7     Collection site RIGHT RADIAL     Drawn by [id number]     Sample type ARTERIAL DRAW     Allens test (pass/fail) PASS  PASS  CBC WITH DIFFERENTIAL     Status: Abnormal   Collection Time    08/27/13  4:04 AM      Result Value Ref Range   WBC 6.6  4.0 - 10.5 K/uL   RBC 3.70 (*) 3.87 - 5.11 MIL/uL   Hemoglobin 10.4 (*) 12.0 - 15.0 g/dL   HCT 32.3 (*) 36.0 - 46.0 %   MCV 87.3  78.0 - 100.0 fL   MCH 28.1  26.0 - 34.0 pg   MCHC 32.2  30.0 - 36.0 g/dL   RDW 13.8  11.5 - 15.5 %   Platelets 274  150 - 400 K/uL   Neutrophils Relative % 66  43 - 77 %   Neutro Abs 4.4  1.7 - 7.7 K/uL   Lymphocytes Relative 22  12 - 46 %   Lymphs Abs 1.5  0.7 - 4.0 K/uL   Monocytes Relative 7  3 - 12 %   Monocytes Absolute 0.5  0.1 - 1.0 K/uL   Eosinophils Relative 4  0 - 5 %   Eosinophils Absolute 0.3  0.0 - 0.7 K/uL   Basophils Relative 1  0 - 1 %   Basophils Absolute 0.1  0.0 - 0.1 K/uL  COMPREHENSIVE METABOLIC PANEL     Status: Abnormal   Collection Time    08/27/13  4:04 AM      Result Value Ref Range   Sodium 142  137 - 147 mEq/L   Potassium 3.2 (*) 3.7 - 5.3 mEq/L    Chloride 99  96 - 112 mEq/L   CO2 28  19 - 32 mEq/L   Glucose, Bld 91  70 - 99 mg/dL   BUN 14  6 - 23 mg/dL   Creatinine, Ser 0.74  0.50 - 1.10 mg/dL   Calcium 9.6  8.4 - 10.5 mg/dL   Total Protein 8.0  6.0 - 8.3 g/dL   Albumin 3.7  3.5 - 5.2 g/dL   AST 12  0 - 37 U/L   ALT 11  0 - 35 U/L   Alkaline Phosphatase 46  39 - 117 U/L   Total Bilirubin 0.3  0.3 - 1.2 mg/dL   GFR calc non Af Amer >90  >90 mL/min   GFR calc Af Amer >90  >90 mL/min   Comment: (NOTE)     The eGFR has been calculated using the CKD EPI equation.     This calculation has not been validated in all clinical situations.     eGFR's persistently <90 mL/min signify possible Chronic Kidney     Disease.  ETHANOL     Status: None   Collection Time    08/27/13  4:04 AM      Result Value Ref Range   Alcohol, Ethyl (B) <11  0 - 11 mg/dL   Comment:            LOWEST DETECTABLE LIMIT FOR     SERUM ALCOHOL IS 11 mg/dL     FOR MEDICAL PURPOSES ONLY  URINALYSIS, ROUTINE W REFLEX MICROSCOPIC  Status: Abnormal   Collection Time    08/27/13  4:27 AM      Result Value Ref Range   Color, Urine YELLOW  YELLOW   APPearance CLOUDY (*) CLEAR   Specific Gravity, Urine 1.028  1.005 - 1.030   pH 6.0  5.0 - 8.0   Glucose, UA NEGATIVE  NEGATIVE mg/dL   Hgb urine dipstick NEGATIVE  NEGATIVE   Bilirubin Urine NEGATIVE  NEGATIVE   Ketones, ur NEGATIVE  NEGATIVE mg/dL   Protein, ur NEGATIVE  NEGATIVE mg/dL   Urobilinogen, UA 0.2  0.0 - 1.0 mg/dL   Nitrite NEGATIVE  NEGATIVE   Leukocytes, UA MODERATE (*) NEGATIVE  URINE RAPID DRUG SCREEN (HOSP PERFORMED)     Status: None   Collection Time    08/27/13  4:27 AM      Result Value Ref Range   Opiates NONE DETECTED  NONE DETECTED   Cocaine NONE DETECTED  NONE DETECTED   Benzodiazepines NONE DETECTED  NONE DETECTED   Amphetamines NONE DETECTED  NONE DETECTED   Tetrahydrocannabinol NONE DETECTED  NONE DETECTED   Barbiturates NONE DETECTED  NONE DETECTED   Comment:            DRUG  SCREEN FOR MEDICAL PURPOSES     ONLY.  IF CONFIRMATION IS NEEDED     FOR ANY PURPOSE, NOTIFY LAB     WITHIN 5 DAYS.                LOWEST DETECTABLE LIMITS     FOR URINE DRUG SCREEN     Drug Class       Cutoff (ng/mL)     Amphetamine      1000     Barbiturate      200     Benzodiazepine   357     Tricyclics       017     Opiates          300     Cocaine          300     THC              50  URINE MICROSCOPIC-ADD ON     Status: None   Collection Time    08/27/13  4:27 AM      Result Value Ref Range   Squamous Epithelial / LPF RARE  RARE   WBC, UA 3-6  <3 WBC/hpf   RBC / HPF 0-2  <3 RBC/hpf   Labs are reviewed and are pertinent for medical issues being addressed  Current Facility-Administered Medications  Medication Dose Route Frequency Provider Last Rate Last Dose  . acetaminophen (TYLENOL) tablet 650 mg  650 mg Oral Q6H PRN Ruthell Rummage Dammen, PA-C      . albuterol (PROVENTIL HFA;VENTOLIN HFA) 108 (90 BASE) MCG/ACT inhaler 2 puff  2 puff Inhalation QID Martie Lee, PA-C   2 puff at 08/28/13 0749  . aspirin EC tablet 81 mg  81 mg Oral Daily Ruthell Rummage Dammen, PA-C   81 mg at 08/28/13 1007  . budesonide-formoterol (SYMBICORT) 160-4.5 MCG/ACT inhaler 1 puff  1 puff Inhalation BID Martie Lee, PA-C   1 puff at 08/28/13 [phone]  . carvedilol (COREG) tablet 6.25 mg  6.25 mg Oral BID WC Ruthell Rummage Dammen, PA-C   6.25 mg at 08/28/13 0753  . cephALEXin (KEFLEX) capsule 500 mg  500 mg Oral BID Ruthell Rummage Dammen, PA-C   500 mg at 08/28/13  1010  . ferrous sulfate tablet 650 mg  650 mg Oral QPM Ruthell Rummage Dammen, PA-C      . furosemide (LASIX) tablet 40 mg  40 mg Oral QODAY Peter S Dammen, PA-C   40 mg at 08/27/13 0941  . hydrOXYzine (ATARAX/VISTARIL) tablet 25 mg  25 mg Oral Q6H PRN Waylan Boga, NP   25 mg at 08/28/13 1417  . Iloprost SOLN 20 mcg  20 mcg Inhalation 6 X Daily Ruthell Rummage Dammen, PA-C      . pantoprazole (PROTONIX) EC tablet 80 mg  80 mg Oral Q1200 Ruthell Rummage Dammen, PA-C   80 mg at 08/27/13  1153  . potassium chloride SA (K-DUR,KLOR-CON) CR tablet 40 mEq  40 mEq Oral Oletta Cohn Dammen, PA-C   40 mEq at 08/27/13 0941  . risperiDONE (RISPERDAL) tablet 1 mg  1 mg Oral BID Waylan Boga, NP      . sildenafil (REVATIO) tablet 80 mg  80 mg Oral 3 times per day Martie Lee, PA-C   80 mg at 08/28/13 1414   Current Outpatient Prescriptions  Medication Sig Dispense Refill  . acetaminophen (TYLENOL) 325 MG tablet Take 650 mg by mouth every 6 (six) hours as needed for mild pain (pain/fever).       Marland Kitchen albuterol (PROAIR HFA) 108 (90 BASE) MCG/ACT inhaler Inhale 2 puffs into the lungs 4 (four) times daily.  8.5 g  11  . aspirin EC 81 MG tablet Take 1 tablet (81 mg total) by mouth daily.  1 tablet  0  . budesonide-formoterol (SYMBICORT) 160-4.5 MCG/ACT inhaler Inhale 1 puff into the lungs 2 (two) times daily.      . carvedilol (COREG) 6.25 MG tablet Take 1 tablet (6.25 mg total) by mouth 2 (two) times daily with a meal.  60 tablet  0  . cephALEXin (KEFLEX) 500 MG capsule Take 1 capsule (500 mg total) by mouth 2 (two) times daily.  20 capsule  0  . esomeprazole (NEXIUM) 40 MG capsule Take 40 mg by mouth every morning.      . ferrous sulfate 325 (65 FE) MG tablet Take 2 tablets (650 mg total) by mouth every evening.  1 tablet  0  . furosemide (LASIX) 40 MG tablet Take 1 tablet (40 mg total) by mouth every other day.  45 tablet  3  . Iloprost 20 MCG/ML SOLN Inhale into the lungs. Ventavis-- 6 times daily - Inhalation  45 mL  2  . potassium chloride SA (K-DUR,KLOR-CON) 20 MEQ tablet Take 2 tablets (40 mEq total) by mouth every other day. When you take Lasix  270 tablet  2  . risperiDONE (RISPERDAL) 1 MG tablet Take 1 tablet (1 mg total) by mouth at bedtime.  30 tablet  0  . sildenafil (REVATIO) 20 MG tablet Take 4 tablets (80 mg total) by mouth 3 (three) times daily. take 4  tablets every 8 hours  10 tablet  0    Psychiatric Specialty Exam:     Blood pressure 134/70, pulse 103, temperature 99  F (37.2 C), temperature source Oral, resp. rate 20, SpO2 98.00%.There is no weight on file to calculate BMI.  General Appearance: Disheveled  Eye Contact::  Minimal  Speech:  Normal Rate  Volume:  Normal  Mood:  Depressed and Irritable  Affect:  Flat  Thought Process:  Disorganized  Orientation:  Full (Time, Place, and Person)  Thought Content:  Delusions, Hallucinations: Auditory Visual and Paranoid Ideation  Appears to attend  to internal stimuli  Suicidal Thoughts:  No  Homicidal Thoughts:  No  Memory:  Immediate;   Fair Recent;   Fair Remote;   Fair  Judgement:  Impaired  Insight:  Lacking  Psychomotor Activity:  Normal  Concentration:  Fair  Recall:  [place]  Language: Good  Akathisia:  No  Handed:  Right  AIMS (if indicated):     Assets:  Housing Resilience Social Support  Sleep:      Musculoskeletal: Strength & Muscle Tone: within normal limits Gait & Station: normal Patient leans: N/A  Treatment Plan Summary: Daily contact with patient to assess and evaluate symptoms and progress in treatment Medication management admit to inpatient psychiatric for stabilization of mood and thought processes; Risperdal increased from 1 mg at bedtime to BID for psychosis and Zyprexa 10 mg po PRN agitation, Vistaril 25 mg every six hours PRN anxiety.   Waylan Boga PMH-NP 08/28/2013 2:19 PM  Patient was seen face-to-face for the psychiatric evaluation, case discussed with physician extender and formulated treatment plan. Reviewed the information documented and agree with the treatment plan.  Parke Simmers Janelys Glassner 08/28/2013 4:16 PM

## 2013-08-28 NOTE — ED Notes (Signed)
Called respiratory to administer home medication breathing treatment that pharmacy sent down.  Patient's family brought in nebulizer equipment.

## 2013-08-28 NOTE — ED Notes (Addendum)
Patient assisted to restroom by NT.

## 2013-08-28 NOTE — ED Notes (Addendum)
Patient breathing into wash cloth, hyperventilating.  Asked patient if she was having any breathing difficulties.  No answer.  O2 sats 100%.  BP-143/100.  Pulse 109.  Tried to remove pulse ox off patient's finger and she resisted pulling back and not letting nurse remove finger monitor.  Nurse tried to engage patient in conversation, but patient not talking.  Patient also refusing her inhaler, and to take her protonix.  Nurse practitioner notified.

## 2013-08-28 NOTE — Consult Note (Signed)
Patient seen, evaluated by me. Discussed with patient's son her current presentation and patient's son states that safety is an issue as patient gets agitated, was found wandering outside at night. Patient needs inpatient treatment for stabilization

## 2013-08-28 NOTE — ED Notes (Signed)
Entered patient room. Patient has eyes closed tightly, washcloth to her face. Patient talking to no one. Patient talking about the red light and making a confession about "something I didn't do". Attempted to redirect patient. Patient opened her eyes and is gazing at "the red dot" on the television. Patient changing her glasses repeatedly, often wearing 2 pairs at one time. Patient offered her meal tray, food opened and prepared for eating. Patient not eating. Patient initially refused to remove washcloth from her face, but at this time has done so. Patient remains alert but  hallucinating at this time.

## 2013-08-28 NOTE — ED Notes (Signed)
Patient very sleepy.  Not able to sit up to take medications.  BP medication held due to low BP 109/53.

## 2013-08-28 NOTE — ED Notes (Signed)
Pt requested and was given a toothbrush and toothpaste in order to brush her teeth.

## 2013-08-28 NOTE — Progress Notes (Signed)
Follow-up calls have been placed to the following facilities pt's referral is pending review per shift report: [facility]- per Aaron Edelman at capacity [facility]- per [place] on accepting "very very low acuity pt's" at this time [facility]- per Aniceto Boss at [place] per [place] at [employer]- per [place] have a lot referral and are currently going through them all, asked TTS cal back later this evening or tomorrow 5.24 [place]- per Anderson Malta at capacity  The following facilities have been contacted and are at capacity: [facility]- per Hassell Done- per [name]- per [facility]- per [place]- per [facility]- per [facility]- per [employer]- per [facility]- per Alexander Bergeron only MR beds   [facility] Disposition MHT

## 2013-08-28 NOTE — ED Notes (Signed)
Pt is tearful at shift change and asking to speak with "someone who can help me."  Pt states, "I don't want to be here, I need to talk to someone who can help me."  Pt reassured.  Pt informed that a psychologist will be here today to see her.

## 2013-08-28 NOTE — ED Notes (Signed)
Nursing was contacted by Pharmacy in regards to medication Iloprost SOLN 20 mcg/  {t's family was suppose to bring medication in but has yet to do so.  If family calls inquire about medication and inform Pharmacy.

## 2013-08-29 DIAGNOSIS — F319 Bipolar disorder, unspecified: Secondary | ICD-10-CM

## 2013-08-29 NOTE — ED Notes (Signed)
Patient awake. Patient talking to no one in particular, "I have to find the red dot again", patient wearing two pairs of eyeglasses again and holding washcloth to face. Patient appears very anxious and distraught.

## 2013-08-29 NOTE — ED Notes (Signed)
Patient becoming dependent on staff for simple tasks such as picking up call bell or picking up cup for a drink of water, patient encouraged to preform these tasks for herself. Patient is reminded to call for assistance with toileting needs and getting up from bed.

## 2013-08-29 NOTE — ED Notes (Signed)
Patient c/o R leg burning. Patient offered Tylenol, declined states it's not hurting. Patient is holding washcloth up to her face, when questioned why she was doing so patient states "it's so I won't say anything". Patient was offered if she would like her lights dimmed, patient would not answer. Lights dimmed.

## 2013-08-29 NOTE — Progress Notes (Signed)
Follow-up calls placed to the following facilities: [facility]- per Lisabeth Pick currently at capacity and have stopped reviewing referrals [facility]- per Juliann Pulse at capacity now so have not reviewed [facility]- per [facility] no longer have any beds and could not tell me if referral had been reviewed.  **Pt's referral faxed to the following on 5.24.15: [facility]- per Maudie Mercury some adult women beds available [facility]- per [place] beds available [facility]arl[facility] Motts- per [facility] reviewing referral [facility]-per Amy can fax for review  The following facilities contacted are at capacity: [facility]- per Tressa Busman- per [facility]- per [facility]- per Zollie Pee- per [facility]- per [facility]- per [facility] Har[facility]- per [facility]ant[facility] Persons- per [facility]- per Nadara Mode- per [facility]- per [facility]- per [facility]- per [place]  Pt declined at: [facility] Disposition MHT

## 2013-08-29 NOTE — Therapy (Signed)
Pt not as anxious as last visit. Pt given her Iloprost and albuterol MDI.  No resp distress noted. Total time with pt was 35 minutes.

## 2013-08-29 NOTE — Progress Notes (Signed)
Patient ID: Rachel [name], female   DOB: Feb 14, 1961, 53 y.o.   MRN: [id number] Psychiatric Specialty Exam: Physical Exam  ROS  Blood pressure 116/80, pulse 95, temperature 98.2 F (36.8 C), temperature source Oral, resp. rate 20, SpO2 100.00%.There is no weight on file to calculate BMI.  General Appearance: Casual and Disheveled  Eye Contact::  Fair  Speech:  Clear and Coherent  Volume:  Normal  Mood:  Anxious  Affect:  Inappropriate  Thought Process:  Disorganized  Orientation:  Other:  Disoriented to time, place and person  Thought Content:  confused, not engaged, not answering questions.  Suicidal Thoughts:  No  Homicidal Thoughts:  No  Memory:  Immediate;   Poor Recent;   Poor Remote;   Poor  Judgement:  Poor  Insight:  Shallow  Psychomotor Activity:  Normal  Concentration:  Poor  Recall:  NA  Akathisia:  NA  Handed:  Right  AIMS (if indicated):     Assets:  Desire for Improvement  Sleep:       Patient is waiting for an admission bed . She remains confused, disorganized and has flight of ideas jumping from one topic to another.  She remains anxious and is medicated as ordered.  We will transfer patient as soon as we have available bed.  We will continue to provide her with her Antipsychotic medications and safety.  Charmaine Downs   PMHNP-BC  Patient was seen face-to-face for this evaluation, case discussed with a physician extender and developed treatment plans. Reviewed the information documented and agree with the treatment plan.  Parke Simmers Mosie Angus 08/29/2013 5:02 PM

## 2013-08-29 NOTE — ED Notes (Signed)
RT at bedside

## 2013-08-29 NOTE — ED Notes (Signed)
Patient continues to rest quietly. Eyes closed. Chest observed for rise and fall. NAD noted.

## 2013-08-29 NOTE — ED Notes (Signed)
Patient paranoid, states the pairs of glasses inside the pocket of bedside table represent the three people she loves most laying in a coffin. Patient states she is supposed to be watching the red dot on the TV, but she can't because the television is on. Patient continues to hold washcloth to her face, states it keeps her from confessing to things she didn't do. Patient repeatedly states "they searched my apartment, I know they did, they took my candy. Noone knows about the candy. The candy represents [place] (her grandaughters). Patient repeats continuously, "I'm not crazy". Patient becomes agitated when staff has to leave her room to provide care for other patients.

## 2013-08-29 NOTE — ED Notes (Signed)
2100 meds held at this time as patient is sleeping. Will admin when patient awakens.

## 2013-08-29 NOTE — Therapy (Signed)
RT went to pt. No family in room. Brought info and I-neb AAD system to room. Pt attempted to instruct her use of neb and medicine to RT. Pt very anxious and impatient. Pt stated "My baby's heart rate was really high", " I need to confess", "My head aches" Pt would not allow me to ask questions until she was ready. She felt like she was chained up. She became very anxious with simple tasks. She did not want to be left alone. My total time with pt in assisting her with her therapy was 50-60 minutes. Therapy was administered to pt within that time.

## 2013-08-30 MED ORDER — ZIPRASIDONE HCL 20 MG PO CAPS
20.0000 mg | ORAL_CAPSULE | Freq: Once | ORAL | Status: AC
  Administered 2013-08-30: 20 mg via ORAL
  Filled 2013-08-30: qty 1

## 2013-08-30 MED ORDER — TRAZODONE HCL 100 MG PO TABS
100.0000 mg | ORAL_TABLET | Freq: Every evening | ORAL | Status: DC | PRN
  Administered 2013-08-30 – 2013-09-02 (×4): 100 mg via ORAL
  Filled 2013-08-30 (×5): qty 1

## 2013-08-30 NOTE — BHH Counselor (Addendum)
Per Manuela Schwartz at [facility] - they are at capacity and they don't hold onto referrals. No one answered phone at [facility] at [facility] - they are at capacity and don't hold onto referrals.  Rachel [name], [place] Assessment Counselor

## 2013-08-30 NOTE — BH Assessment (Signed)
Per Florencia Reasons, [facility] at [facility], an appropriate bed is not currently available. Contacted the following facilities:   At capacity: [facility]: Per [facility]: Per [facility]: Per [facility]: Per [facility]: Per [facility]: Per [facility]  Left voicemail requesting bed status: [facility]  Bed available. Faxed clinical information: [facility]  Pt is declined at: [place], [place], [facility] Triage Specialist [phone]

## 2013-08-30 NOTE — ED Notes (Signed)
Patient continues to try and get out of the bed. Patient redirected and repositioned in bed. Dr Stevie Kern updated.

## 2013-08-30 NOTE — ED Notes (Signed)
Patient continuously removing O2 via [place]. Patient redirected to leave O2 [place] on. Patient O2 tubing moved and disguised. Patient attempting to get out of bed. Patient unable to be redirected. SPO2 decreased to 76% off O2. Dr Stevie Kern notified. Orders received and implemented.

## 2013-08-30 NOTE — ED Provider Notes (Signed)
2020: Pt pulled off her underwear trying to get out of bed, yelling, not cooperating with RN, pulling O2 off causing O2 sat to drop to 76% per RN back to 90's when O2 replaced; Pt placed in soft wrist restraints for medical interference with O2 and given single dose of Geodon for ongoing hallucinations and psychosis, Pt denies SI/HI.  Babette Relic, MD 09/02/13 [phone]

## 2013-08-30 NOTE — ED Notes (Signed)
Patient agitated this am. Patient removing her oxygen and continually attempting to remove her pants. Patient reminded she must wear her oxygen for her safety and that she is required to keep her pants on while in the hospital setting. Patient found to be placing washcloths, her glasses case, and napkins into her pants. Will con't to monitor.

## 2013-08-30 NOTE — ED Notes (Signed)
Pt anxious in room, teary eyed, will not tell nurse what is wrong, pt taking oxygen off multiple times, instructed to keep oxygen on.

## 2013-08-30 NOTE — ED Notes (Signed)
PATIENT BELONGINGS VERIFIED TO BE IN LOCKER 71

## 2013-08-30 NOTE — Progress Notes (Signed)
Pt referred to [facility]. CSW obtained [facility] authorization [id number] through 09/05/2013.    Noreene Larsson [phone]  ED CSW 08/30/2013 1105am

## 2013-08-30 NOTE — BH Assessment (Signed)
Received call for assessment. Dr. Stevie Kern would like psychiatic consult tonight for medication recommendations. Pt tends to be more agitated at night. Notified Patriciaann Clan, PA or consult request.  Rachel [name], Rachel [name], [facility] Triage Specialist [phone]

## 2013-08-30 NOTE — BHH Counselor (Signed)
Per Alyse Low at [facility], referral hasn't been reviewed yet.   Arnold Long, [place] Assessment Counselor

## 2013-08-30 NOTE — Significant Event (Cosign Needed)
Dr Stevie Kern request psychiatry to review patients medications due to nigh time agitation. Patient already Rx antipsychotics Geodon 20 mg q daily and Risperidol 1 mg BID. Patient is also Rx Vistaril 25 mg q 6 hours prn agitation.Will add Trazodone, a Serotonin reuptake inhibitor primarily for sleep 100 mg hs with repeat prn.  Thanks  Patriciaann Clan PA-C

## 2013-08-30 NOTE — Consult Note (Signed)
[facility] Consult   Reason for Consult:  Psychosis  Referring Physician:  EDP Rachel [name] is an 53 y.o. female. Total Time spent with patient: 20 minutes  Assessment: AXIS I:  Psychotic Disorder NOS AXIS II:  Deferred AXIS III:   Past Medical History  Diagnosis Date  . GERD (gastroesophageal reflux disease)   . Hypertension   . 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 CHF (congestive heart failure)     Right side- Dr. Aundra Dubin ([facility])  . Pulmonary HTN   . Anxiety   . Cardiomyopathy   . NSVT (nonsustained ventricular tachycardia)     with syncope: St Jude ICD was implanted. Device now nearing ERI. Given end stage lung disease and normalization of LV function, the device will not be replaced and tachy therapies were turned off  . Chronic chest pain     LHC (08/2008) with no angiographic coronary diease  . Allergic rhinitis   . H/O: iron deficiency anemia   . Secondary amenorrhea     irregular menses  . Psychosis     with high dose steroids  . SVT (supraventricular tachycardia)     possible runs  . Hypokalemia     recurrent  . Rotator cuff sprain   . Chronic back pain    AXIS IV:  other psychosocial or environmental problems and problems related to social environment AXIS V:  21-30 behavior considerably influenced by delusions or hallucinations OR serious impairment in judgment, communication OR inability to function in almost all areas  Plan:  Recommend psychiatric Inpatient admission when medically cleared.  Dr. Dwyane Dee assessed and is agreeable with current plan.  Subjective:   Rachel [name] is a 53 y.o. female patient admitted with acute psychosis  HPI:  Patient presents to the emergency department after becoming confused, wandering away from the home related to paranoia.  Patient gets easily irritated with her children who live with her and help her manage her multiple chronic medical issues.   Patient has difficulties with remembering to take her  medications and has been increasingly paranoid. Psychiatrist feels that patient is not currently safe to be around new baby and other children in the home due to paranoia and increased irritability. At times appears to attend to internal stimuli, typically hears voices.    Family has brought patient into the emergency department three times in the past month.  Patient denies current suicidal ideation/HI.  Currently alert and oriented x 4.  Today patient presents as more clear and coherent although noted to be actively attending to internal stimuli last evening.  Patient is amendable to inpatient hospitalization for stabilization of mood and thoughts.  HPI Elements:   Location:  generalized. Quality:  acute. Severity:  severe. Timing:  past month. Duration:  constant. Context:  stressors.  Past Psychiatric History: Past Medical History  Diagnosis Date  . GERD (gastroesophageal reflux disease)   . Hypertension   . 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 CHF (congestive heart failure)     Right side- Dr. Aundra Dubin ([facility])  . Pulmonary HTN   . Anxiety   . Cardiomyopathy   . NSVT (nonsustained ventricular tachycardia)     with syncope: St Jude ICD was implanted. Device now nearing ERI. Given end stage lung disease and normalization of LV function, the device will not be replaced and tachy therapies were turned off  . Chronic chest pain     LHC (08/2008) with  no angiographic coronary diease  . Allergic rhinitis   . H/O: iron deficiency anemia   . Secondary amenorrhea     irregular menses  . Psychosis     with high dose steroids  . SVT (supraventricular tachycardia)     possible runs  . Hypokalemia     recurrent  . Rotator cuff sprain   . Chronic back pain     reports that she quit smoking about 21 years ago. Her smoking use included Cigarettes. She has a 16.5 pack-year smoking history. She has never used smokeless tobacco. She reports that she drinks alcohol. She reports that she does not  use illicit drugs. Family History  Problem Relation Age of Onset  . Lung cancer Father   . Cancer Father     lung cancer  . Hypertension    . Diabetes Mother     border line  . Hypertension Mother   . Heart failure Mother    Family History Substance Abuse: No Family Supports: Yes, List: (pt son and daugher ) Living Arrangements: Children Can pt return to current living arrangement?: Yes   Allergies:   Allergies  Allergen Reactions  . Latex Rash  . Nitroglycerin Other (See Comments)    Patient is on Revatio  . Ace Inhibitors     unknown  . Meperidine Hcl Other (See Comments)    REACTION: Makes her feel funny  . Other Other (See Comments)    High Dose Steroids cause chemical imbalance and altered mental status  . Tramadol Hcl Other (See Comments)    REACTION: nausea, lightheadedness, sleepiness, and dizziness  . Propoxyphene N-Acetaminophen Rash    REACTION: rash: pruritus    ACT Assessment Complete:  Yes:    Educational Status    Risk to Self: Risk to self Suicidal Ideation: No Suicidal Intent: No Is patient at risk for suicide?: No Suicidal Plan?: No Access to Means: No What has been your use of drugs/alcohol within the last 12 months?: none Previous Attempts/Gestures: No How many times?: 0 Other Self Harm Risks: no Triggers for Past Attempts: None known Intentional Self Injurious Behavior: None Family Suicide History: No Recent stressful life event(s): Other (Comment) Persecutory voices/beliefs?:  (caring for lots of grandchildren) Depression: No Substance abuse history and/or treatment for substance abuse?: No  Risk to Others: Risk to Others Homicidal Ideation: No Thoughts of Harm to Others: No Current Homicidal Intent: No Current Homicidal Plan: No Access to Homicidal Means: No Identified Victim: n/a History of harm to others?: No Assessment of Violence: None Noted Violent Behavior Description: none Does patient have access to weapons?: No Criminal  Charges Pending?: No Does patient have a court date: No  Abuse:    Prior Inpatient Therapy: Prior Inpatient Therapy Prior Inpatient Therapy: Yes [facility] Therapy [facility]ilty[facility]Provider[facility]  Reason for Treatment: psychosis  Prior Outpatient Therapy: Prior Outpatient Therapy Prior Outpatient Therapy: No  Additional Information: Additional Information 1:1 In Past 12 Months?: No CIRT Risk: No Elopement Risk: No Does patient have medical clearance?: Yes                  Objective: Blood pressure 133/58, pulse 105, temperature 98.8 F (37.1 C), temperature source Oral, resp. rate 18, SpO2 94.00%.There is no weight on file to calculate BMI. No results found for this or any previous visit (from the past 72 hour(s)). Labs are reviewed and are pertinent for medical issues being addressed  Current Facility-Administered Medications  Medication Dose Route Frequency Provider Last  Rate Last Dose  . acetaminophen (TYLENOL) tablet 650 mg  650 mg Oral Q6H PRN Ruthell Rummage Dammen, PA-C      . albuterol (PROVENTIL HFA;VENTOLIN HFA) 108 (90 BASE) MCG/ACT inhaler 2 puff  2 puff Inhalation QID Martie Lee, PA-C   2 puff at 08/30/13 0945  . aspirin EC tablet 81 mg  81 mg Oral Daily Ruthell Rummage Dammen, PA-C   81 mg at 08/30/13 1034  . budesonide-formoterol (SYMBICORT) 160-4.5 MCG/ACT inhaler 1 puff  1 puff Inhalation BID Martie Lee, PA-C   1 puff at 08/30/13 [phone]  . carvedilol (COREG) tablet 6.25 mg  6.25 mg Oral BID WC Ruthell Rummage Dammen, PA-C   6.25 mg at 08/30/13 6389  . cephALEXin (KEFLEX) capsule 500 mg  500 mg Oral BID Ruthell Rummage Dammen, PA-C   500 mg at 08/30/13 1034  . ferrous sulfate tablet 650 mg  650 mg Oral QPM Ruthell Rummage Dammen, PA-C   650 mg at 08/29/13 1709  . furosemide (LASIX) tablet 40 mg  40 mg Oral QODAY Ruthell Rummage Dammen, PA-C   40 mg at 08/29/13 1207  . hydrOXYzine (ATARAX/VISTARIL) tablet 25 mg  25 mg Oral Q6H PRN Waylan Boga, NP   25 mg at 08/30/13 0013  . Iloprost SOLN 20 mcg   20 mcg Inhalation 6 X Daily Martie Lee, PA-C   20 mcg at 08/30/13 3734  . pantoprazole (PROTONIX) EC tablet 80 mg  80 mg Oral Q1200 Ruthell Rummage Dammen, PA-C   80 mg at 08/30/13 1034  . potassium chloride SA (K-DUR,KLOR-CON) CR tablet 40 mEq  40 mEq Oral Oletta Cohn Dammen, PA-C   40 mEq at 08/29/13 2876  . risperiDONE (RISPERDAL) tablet 1 mg  1 mg Oral BID Waylan Boga, NP   1 mg at 08/30/13 0824  . sildenafil (REVATIO) tablet 80 mg  80 mg Oral 3 times per day Martie Lee, PA-C   80 mg at 08/30/13 8115   Current Outpatient Prescriptions  Medication Sig Dispense Refill  . acetaminophen (TYLENOL) 325 MG tablet Take 650 mg by mouth every 6 (six) hours as needed for mild pain (pain/fever).       Marland Kitchen albuterol (PROAIR HFA) 108 (90 BASE) MCG/ACT inhaler Inhale 2 puffs into the lungs 4 (four) times daily.  8.5 g  11  . aspirin EC 81 MG tablet Take 1 tablet (81 mg total) by mouth daily.  1 tablet  0  . budesonide-formoterol (SYMBICORT) 160-4.5 MCG/ACT inhaler Inhale 1 puff into the lungs 2 (two) times daily.      . carvedilol (COREG) 6.25 MG tablet Take 1 tablet (6.25 mg total) by mouth 2 (two) times daily with a meal.  60 tablet  0  . cephALEXin (KEFLEX) 500 MG capsule Take 1 capsule (500 mg total) by mouth 2 (two) times daily.  20 capsule  0  . esomeprazole (NEXIUM) 40 MG capsule Take 40 mg by mouth every morning.      . ferrous sulfate 325 (65 FE) MG tablet Take 2 tablets (650 mg total) by mouth every evening.  1 tablet  0  . furosemide (LASIX) 40 MG tablet Take 1 tablet (40 mg total) by mouth every other day.  45 tablet  3  . Iloprost 20 MCG/ML SOLN Inhale into the lungs. Ventavis-- 6 times daily - Inhalation  45 mL  2  . potassium chloride SA (K-DUR,KLOR-CON) 20 MEQ tablet Take 2 tablets (40 mEq total) by mouth every other  day. When you take Lasix  270 tablet  2  . risperiDONE (RISPERDAL) 1 MG tablet Take 1 tablet (1 mg total) by mouth at bedtime.  30 tablet  0  . sildenafil (REVATIO) 20 MG  tablet Take 4 tablets (80 mg total) by mouth 3 (three) times daily. take 4  tablets every 8 hours  10 tablet  0    Psychiatric Specialty Exam:     Blood pressure 133/58, pulse 105, temperature 98.8 F (37.1 C), temperature source Oral, resp. rate 18, SpO2 94.00%.There is no weight on file to calculate BMI.  General Appearance: Casual  Eye Contact::  Fair  Speech:  Normal Rate  Volume:  Normal  Mood:  Depressed  Affect:  Flat  Thought Process:  Disorganized  Orientation:  Full (Time, Place, and Person)  Thought Content:  Delusions, Hallucinations: Auditory Visual and Paranoid Ideation  Appears to attend to internal stimuli at times   Suicidal Thoughts:  No  Homicidal Thoughts:  No  Memory:  Immediate;   Fair Recent;   Fair Remote;   Fair  Judgement:  Impaired  Insight:  Lacking  Psychomotor Activity:  Normal  Concentration:  Fair  Recall:  [employer] [place]: Good  Akathisia:  No  Handed:  Right  AIMS (if indicated):     Assets:  Housing Resilience Social Support  Sleep:      Musculoskeletal: Strength & Muscle Tone: within normal limits Gait & Station: normal Patient leans: N/A  Treatment Plan Summary: Daily contact with patient to assess and evaluate symptoms and progress in treatment Medication management admit to inpatient psychiatric for stabilization of mood and thought processes   Waylan Boga PMH-NP 08/30/2013 11:35 AM I have personally seen the patient and agreed with the findings and involved in the treatment plan. Merian Capron, MD

## 2013-08-31 ENCOUNTER — Encounter (HOSPITAL_COMMUNITY): Payer: Self-pay | Admitting: Registered Nurse

## 2013-08-31 DIAGNOSIS — F323 Major depressive disorder, single episode, severe with psychotic features: Secondary | ICD-10-CM | POA: Diagnosis present

## 2013-08-31 MED ORDER — TRAZODONE HCL 100 MG PO TABS: 100.0000 mg | ORAL_TABLET | Freq: Every evening | ORAL | Status: DC | PRN

## 2013-08-31 MED ORDER — PAROXETINE HCL 20 MG PO TABS: 20.0000 mg | ORAL_TABLET | Freq: Every day | ORAL | Status: DC

## 2013-08-31 MED ORDER — ARIPIPRAZOLE 2 MG PO TABS: 2.0000 mg | ORAL_TABLET | Freq: Every day | ORAL | Status: DC

## 2013-08-31 MED ORDER — ARIPIPRAZOLE 2 MG PO TABS
2.0000 mg | ORAL_TABLET | Freq: Every day | ORAL | Status: DC
  Administered 2013-09-01 – 2013-09-03 (×3): 2 mg via ORAL
  Filled 2013-08-31 (×6): qty 1

## 2013-08-31 MED ORDER — FLUOXETINE HCL 20 MG PO CAPS
20.0000 mg | ORAL_CAPSULE | Freq: Every day | ORAL | Status: DC
  Administered 2013-09-01 – 2013-09-03 (×3): 20 mg via ORAL
  Filled 2013-08-31 (×6): qty 1

## 2013-08-31 MED ORDER — HYDROXYZINE HCL 25 MG PO TABS
25.0000 mg | ORAL_TABLET | Freq: Three times a day (TID) | ORAL | Status: DC | PRN
  Administered 2013-09-01 – 2013-09-03 (×5): 25 mg via ORAL
  Filled 2013-08-31 (×5): qty 1

## 2013-08-31 NOTE — BH Assessment (Signed)
Discharge per Dr. Laurena Spies, NP.

## 2013-08-31 NOTE — Progress Notes (Signed)
Per Opal Sidles, Patient on [facility] waitlist.   Noreene Larsson [phone]  ED CSW 08/31/2013 900am

## 2013-08-31 NOTE — Progress Notes (Addendum)
Due to patient appearing paranoid over csw speaking with family, pt requiring restraints and geodon last night, pt to remain in the ED for further observation. Pt remains on [facility] waitlist and pending placement. csw attempted to reach pt son, and left message.   Noreene Larsson [phone]  ED CSW 08/31/2013 1354pm

## 2013-08-31 NOTE — Discharge Instructions (Signed)
Major Depressive Disorder Major depressive disorder (MDD) is a mental illness. It also may be called clinical depression or unipolar depression. MDD usually causes feelings of sadness, hopelessness, or helplessness. Some people with MDD do not feel particularly sad but lose interest in doing things they used to enjoy (anhedonia). MDD also can cause physical symptoms. It can interfere with work, school, relationships, and other normal everyday activities. MDD varies in severity but is longer lasting and more serious than the sadness we all feel from time to time in our lives. MDD often is triggered by stressful life events or major life changes. Examples of these triggers include divorce, loss of your job or home, a move, and the death of a family member or close friend. Sometimes MDD occurs for no obvious reason at all. People who have family members with MDD or bipolar disorder are at higher risk for developing MDD, with or without life stressors. MDD can occur at any age. It may occur just once in your life (single episode MDD). It may occur multiple times (recurrent MDD). SYMPTOMS People with MDD have either anhedonia or depressed mood on nearly a daily basis for at least 2 weeks or longer. Symptoms of depressed mood include:  Feelings of sadness (blue or down in the dumps) or emptiness.  Feelings of hopelessness or helplessness.  Tearfulness or episodes of crying (may be observed by others).  Irritability (children and adolescents). In addition to depressed mood or anhedonia or both, people with MDD have at least four of the following symptoms:  Difficulty sleeping or sleeping too much.   Significant change (increase or decrease) in appetite or weight.   Lack of energy or motivation.  Feelings of guilt and worthlessness.   Difficulty concentrating, remembering, or making decisions.  Unusually slow movement (psychomotor retardation) or restlessness (as observed by others).    Recurrent wishes for death, recurrent thoughts of self-harm (suicide), or a suicide attempt. People with MDD commonly have persistent negative thoughts about themselves, other people, and the world. People with severe MDD may experiencedistorted beliefs or perceptions about the world (psychotic delusions). They also may see or hear things that are not real (psychotic hallucinations). DIAGNOSIS MDD is diagnosed through an assessment by your caregiver. Your caregiver will ask aboutaspects of your daily life, such as mood,sleep, and appetite, to see if you have the diagnostic symptoms of MDD. Your caregiver may ask about your medical history and use of alcohol or drugs, including prescription medications. Your caregiver also may do a physical exam and blood work. This is because certain medical conditions and the use of certain substances can cause MDD-like symptoms (secondary depression). Your caregiver also may refer you to a mental health specialist for further evaluation and treatment. TREATMENT It is important to recognize the symptoms of MDD and seek treatment. The following treatments can be prescribed for MDD:   Medication Antidepressant medications usually are prescribed. Antidepressant medications are thought to correct chemical imbalances in the brain that are commonly associated with MDD. Other types of medication may be added if MDD symptoms do not respond to antidepressant medications alone or if psychotic delusions or hallucinations occur.  Talk therapy Talk therapy can be helpful in treating MDD by providing support, education, and guidance. Certain types of talk therapy also can help with negative thinking (cognitive behavioral therapy) and with relationship issues that trigger MDD (interpersonal therapy). A mental health specialist can help determine which treatment is best for you. Most people with MDD do well with a  combination of medication and talk therapy. Treatments involving  electrical stimulation of the brain can be used in situations with extremely severe symptoms or when medication and talk therapy do not work over time. These treatments include electroconvulsive therapy, transcranial magnetic stimulation, and vagal nerve stimulation. Document Released: 07/20/2012 Document Reviewed: 07/20/2012 [facility] Patient Information 2014 [place], [place].

## 2013-08-31 NOTE — Progress Notes (Signed)
Per discussion with psychiatrist, patient psychiatrically stable for dc home with pt daughter. Pt recommended for outpatient treatment for medication management and therapy. Patient alert and oriented x4. Patient reports that she would like her family to be present upon discharge to have a plan in place. Pt calling pt family members as she requested to arrange.   Noreene Larsson [phone]  ED CSW 08/31/2013 1136am

## 2013-08-31 NOTE — ED Notes (Signed)
Son called upset.  States bank will not release her money so that he can pay "her rent".  Was told to get a power of attorney.  Son wanted to know how to get that done bc he didn't have time to play around with it.  Explained pt had to be of sound mind in order to sign.  Pt began cussing at staff stating "when is she going to be sound of mind".  Notified son that I would let her know his concerns and that he called.  Son hung up on staff.

## 2013-08-31 NOTE — Consult Note (Signed)
[facility] Follow Up Psychiatry Consult   Reason for Consult:  Psychosis  Referring Physician:  EDP Rachel [name] is an 53 y.o. female. Total Time spent with patient: 30 minutes  Assessment: AXIS I:  Major Depressive Disorder with psychotic features AXIS II:  Deferred AXIS III:   Past Medical History  Diagnosis Date  . GERD (gastroesophageal reflux disease)   . Hypertension   . 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 CHF (congestive heart failure)     Right side- Dr. Aundra Dubin ([facility])  . Pulmonary HTN   . Anxiety   . Cardiomyopathy   . NSVT (nonsustained ventricular tachycardia)     with syncope: St Jude ICD was implanted. Device now nearing ERI. Given end stage lung disease and normalization of LV function, the device will not be replaced and tachy therapies were turned off  . Chronic chest pain     LHC (08/2008) with no angiographic coronary diease  . Allergic rhinitis   . H/O: iron deficiency anemia   . Secondary amenorrhea     irregular menses  . Psychosis     with high dose steroids  . SVT (supraventricular tachycardia)     possible runs  . Hypokalemia     recurrent  . Rotator cuff sprain   . Chronic back pain    AXIS IV:  other psychosocial or environmental problems and problems related to social environment AXIS V:  21-30 behavior considerably influenced by delusions or hallucinations OR serious impairment in judgment, communication OR inability to function in almost all areas  Plan:  Recommend psychiatric Inpatient admission when medically cleared.  Dr. Dwyane Dee assessed and is agreeable with current plan.  Subjective:   Rachel [name] is a 53 y.o. female patient admitted with acute psychosis  HPI: Patient state ""My family noticed that I had isolating my self; just not being my self.  I knew something was wrong; I just didn't know what.  I had started staying in my room, just isolating my self; I was feeling tired; and no energy. I would find myself crying for no reason.  I  an usually a up beat person. But now all I did was just sit in room and look at TV and seen this commercial on [employer] and I was like WOW cause I was having some of the symptoms like the fatigue.  You know one time they had said I was schizophrenic but it took me a long time to get that off.  I have been really good until I got sick with infection.  I had a really bad urinary tract infection and had to bee put in the hospital. After that I was tired all the time and just sleeping all the time.  Crying all the time; just feeling depressed.  No I am not hearing voices; only the TV; I leave it on all the time.  I was more like just feeling really weird; I couldn't put my finger on it.  Like I know I'm in the room by my self like just watching out of body.  My memory is fine; I'm not having no problems with my memory.  Last night they said that I was doing stuff but I don't remember. The gave me something to relax me and I slept really good last night and today I feel really good.  No I don't want to hurt my self or nobody.  I know I'm sick but I don't want to  go know where. I love my children and my grandchildren. I just need to get back to myself.   Patient denies suicidal/homicidal ideation, psychosis, and paranoia.  HPI Elements:   Location:  generalized. Quality:  acute. Severity:  severe. Timing:  past month. Duration:  constant. Context:  stressors.  Past Psychiatric History: Past Medical History  Diagnosis Date  . GERD (gastroesophageal reflux disease)   . Hypertension   . 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 CHF (congestive heart failure)     Right side- Dr. Aundra Dubin ([facility])  . Pulmonary HTN   . Anxiety   . Cardiomyopathy   . NSVT (nonsustained ventricular tachycardia)     with syncope: St Jude ICD was implanted. Device now nearing ERI. Given end stage lung disease and normalization of LV function, the device will not be replaced and tachy therapies were turned off  . Chronic chest pain      LHC (08/2008) with no angiographic coronary diease  . Allergic rhinitis   . H/O: iron deficiency anemia   . Secondary amenorrhea     irregular menses  . Psychosis     with high dose steroids  . SVT (supraventricular tachycardia)     possible runs  . Hypokalemia     recurrent  . Rotator cuff sprain   . Chronic back pain     reports that she quit smoking about 21 years ago. Her smoking use included Cigarettes. She has a 16.5 pack-year smoking history. She has never used smokeless tobacco. She reports that she drinks alcohol. She reports that she does not use illicit drugs. Family History  Problem Relation Age of Onset  . Lung cancer Father   . Cancer Father     lung cancer  . Hypertension    . Diabetes Mother     border line  . Hypertension Mother   . Heart failure Mother    Family History Substance Abuse: No Family Supports: Yes, List: (pt son and daugher ) Living Arrangements: Children Can pt return to current living arrangement?: Yes   Allergies:   Allergies  Allergen Reactions  . Latex Rash  . Nitroglycerin Other (See Comments)    Patient is on Revatio  . Ace Inhibitors     unknown  . Meperidine Hcl Other (See Comments)    REACTION: Makes her feel funny  . Other Other (See Comments)    High Dose Steroids cause chemical imbalance and altered mental status  . Tramadol Hcl Other (See Comments)    REACTION: nausea, lightheadedness, sleepiness, and dizziness  . Propoxyphene N-Acetaminophen Rash    REACTION: rash: pruritus    ACT Assessment Complete:  Yes:    Educational Status    Risk to Self: Risk to self Suicidal Ideation: No Suicidal Intent: No Is patient at risk for suicide?: No Suicidal Plan?: No Access to Means: No What has been your use of drugs/alcohol within the last 12 months?: none Previous Attempts/Gestures: No How many times?: 0 Other Self Harm Risks: no Triggers for Past Attempts: None known Intentional Self Injurious Behavior:  None Family Suicide History: No Recent stressful life event(s): Other (Comment) Persecutory voices/beliefs?:  (caring for lots of grandchildren) Depression: No Substance abuse history and/or treatment for substance abuse?: No  Risk to Others: Risk to Others Homicidal Ideation: No Thoughts of Harm to Others: No Current Homicidal Intent: No Current Homicidal Plan: No Access to Homicidal Means: No Identified Victim: n/a History of harm to others?: No Assessment  of Violence: None Noted Violent Behavior Description: none Does patient have access to weapons?: No Criminal Charges Pending?: No Does patient have a court date: No  Abuse:    Prior Inpatient Therapy: Prior Inpatient Therapy Prior Inpatient Therapy: Yes Prior Therapy Facilty/Provider(s): [facility]  Reason for Treatment: psychosis  Prior Outpatient Therapy: Prior Outpatient Therapy Prior Outpatient Therapy: No  Additional Information: Additional Information 1:1 In Past 12 Months?: No CIRT Risk: No Elopement Risk: No Does patient have medical clearance?: Yes    Objective: Blood pressure 107/63, pulse 83, temperature 99.3 F (37.4 C), temperature source Oral, resp. rate 20, SpO2 99.00%.There is no weight on file to calculate BMI. No results found for this or any previous visit (from the past 72 hour(s)). Labs are reviewed.  Medication changes Abilify 2 mg daily, Prozac 20 mg daily, Vistaril Tid prn.  Current Facility-Administered Medications  Medication Dose Route Frequency Provider Last Rate Last Dose  . acetaminophen (TYLENOL) tablet 650 mg  650 mg Oral Q6H PRN Ruthell Rummage Dammen, PA-C      . albuterol (PROVENTIL HFA;VENTOLIN HFA) 108 (90 BASE) MCG/ACT inhaler 2 puff  2 puff Inhalation QID Martie Lee, PA-C   2 puff at 08/31/13 [phone]  . aspirin EC tablet 81 mg  81 mg Oral Daily Ruthell Rummage Dammen, PA-C   81 mg at 08/31/13 1017  . budesonide-formoterol (SYMBICORT) 160-4.5 MCG/ACT inhaler 1 puff  1 puff Inhalation BID Martie Lee, PA-C   1 puff at 08/31/13 [phone]  . carvedilol (COREG) tablet 6.25 mg  6.25 mg Oral BID WC Ruthell Rummage Dammen, PA-C   6.25 mg at 08/31/13 0850  . cephALEXin (KEFLEX) capsule 500 mg  500 mg Oral BID Ruthell Rummage Dammen, PA-C   500 mg at 08/31/13 1017  . ferrous sulfate tablet 650 mg  650 mg Oral QPM Ruthell Rummage Dammen, PA-C   650 mg at 08/30/13 1734  . furosemide (LASIX) tablet 40 mg  40 mg Oral QODAY Peter S Dammen, PA-C   40 mg at 08/31/13 1017  . hydrOXYzine (ATARAX/VISTARIL) tablet 25 mg  25 mg Oral Q6H PRN Waylan Boga, NP   25 mg at 08/30/13 1734  . Iloprost SOLN 20 mcg  20 mcg Inhalation 6 X Daily Martie Lee, PA-C   20 mcg at 08/31/13 0850  . pantoprazole (PROTONIX) EC tablet 80 mg  80 mg Oral Q1200 Ruthell Rummage Dammen, PA-C   80 mg at 08/30/13 1034  . potassium chloride SA (K-DUR,KLOR-CON) CR tablet 40 mEq  40 mEq Oral QODAY Peter S Dammen, PA-C   40 mEq at 08/31/13 1017  . risperiDONE (RISPERDAL) tablet 1 mg  1 mg Oral BID Waylan Boga, NP   1 mg at 08/31/13 0850  . sildenafil (REVATIO) tablet 80 mg  80 mg Oral 3 times per day Martie Lee, PA-C   80 mg at 08/31/13 3086  . traZODone (DESYREL) tablet 100 mg  100 mg Oral QHS,MR X 1 Laverle Hobby, PA-C   100 mg at 08/30/13 2138   Current Outpatient Prescriptions  Medication Sig Dispense Refill  . acetaminophen (TYLENOL) 325 MG tablet Take 650 mg by mouth every 6 (six) hours as needed for mild pain (pain/fever).       Marland Kitchen albuterol (PROAIR HFA) 108 (90 BASE) MCG/ACT inhaler Inhale 2 puffs into the lungs 4 (four) times daily.  8.5 g  11  . aspirin EC 81 MG tablet Take 1 tablet (81 mg total) by mouth  daily.  1 tablet  0  . budesonide-formoterol (SYMBICORT) 160-4.5 MCG/ACT inhaler Inhale 1 puff into the lungs 2 (two) times daily.      . carvedilol (COREG) 6.25 MG tablet Take 1 tablet (6.25 mg total) by mouth 2 (two) times daily with a meal.  60 tablet  0  . cephALEXin (KEFLEX) 500 MG capsule Take 1 capsule (500 mg total) by mouth 2 (two) times  daily.  20 capsule  0  . esomeprazole (NEXIUM) 40 MG capsule Take 40 mg by mouth every morning.      . ferrous sulfate 325 (65 FE) MG tablet Take 2 tablets (650 mg total) by mouth every evening.  1 tablet  0  . furosemide (LASIX) 40 MG tablet Take 1 tablet (40 mg total) by mouth every other day.  45 tablet  3  . Iloprost 20 MCG/ML SOLN Inhale into the lungs. Ventavis-- 6 times daily - Inhalation  45 mL  2  . potassium chloride SA (K-DUR,KLOR-CON) 20 MEQ tablet Take 2 tablets (40 mEq total) by mouth every other day. When you take Lasix  270 tablet  2  . risperiDONE (RISPERDAL) 1 MG tablet Take 1 tablet (1 mg total) by mouth at bedtime.  30 tablet  0  . sildenafil (REVATIO) 20 MG tablet Take 4 tablets (80 mg total) by mouth 3 (three) times daily. take 4  tablets every 8 hours  10 tablet  0    Psychiatric Specialty Exam:     Blood pressure 107/63, pulse 83, temperature 99.3 F (37.4 C), temperature source Oral, resp. rate 20, SpO2 99.00%.There is no weight on file to calculate BMI.  General Appearance: Casual  Eye Contact::  Fair  Speech:  Normal Rate  Volume:  Normal  Mood:  Depressed  Affect:  Flat  Thought Process:  Circumstantial, Coherent and Goal Directed  Orientation:  Full (Time, Place, and Person)  Thought Content:  "I am just depressed"   Patient conversation was very related and target during interview; no appearance of responding to internal stimuli and denies auditory and visual hallucinations  Suicidal Thoughts:  No  Homicidal Thoughts:  No  Memory:  Immediate;   Fair Recent;   Fair Remote;   Fair  Judgement:  Intact  Insight:  Present  Psychomotor Activity:  Normal  Concentration:  Fair  Recall:  Good  Fund of Knowledge:Good  Language: Good  Akathisia:  No  Handed:  Right  AIMS (if indicated):     Assets:  Housing Resilience Social Support  Sleep:      Musculoskeletal: Strength & Muscle Tone: within normal limits Gait & Station: normal Patient leans:  N/A  Treatment Plan Summary: Outpatient services for therapy and medication management.      Disposition:  Set up appointment for outpatient services (therapy and medication management).  After appointment has been set can discharge home.  Family may want to follow up with neurology if feel that patient is having other cognitive problems.   Discharge Assessment     Demographic Factors:  Female  Total Time spent with patient: 15 minutes  Psychiatric Specialty Exam: Same as above  Musculoskeletal: Same as above  Mental Status Per Nursing Assessment::   On Admission:     Current Mental Status by Physician: Patient denies suicidal/homicidal ideation, psychosis, and paranoia  Loss Factors: NA  Historical Factors: NA  Risk Reduction Factors:   Sense of responsibility to family, Religious beliefs about death, Living with another person, especially a relative and  Positive social support  Continued Clinical Symptoms:  Medical Diagnoses and Treatments/Surgeries  Cognitive Features That Contribute To Risk:  None noted at this time    Suicide Risk:  Minimal: No identifiable suicidal ideation.  Patients presenting with no risk factors but with morbid ruminations; may be classified as minimal risk based on the severity of the depressive symptoms  Discharge Diagnoses: Same as above   Plan Of Care/Follow-up recommendations:  Activity:  Resume usual activity Diet:  Resume usual diet  Is patient on multiple antipsychotic therapies at discharge:  No   Has Patient had three or more failed trials of antipsychotic monotherapy by history:  No  Recommended Plan for Multiple Antipsychotic Therapies: NA   Amare Bail, FNP-BC 08/31/2013 10:52 AM

## 2013-08-31 NOTE — ED Notes (Signed)
Patient requested restroom assistance. Patient was placed on bedpan, unable to void at this time. Will con't to monitor.

## 2013-08-31 NOTE — ED Notes (Signed)
Vital signs deferred at this time, patient sleeping. Chest observed for rise and fall. [place] in place. NAD noted.

## 2013-08-31 NOTE — ED Notes (Signed)
Patient awakens this morning more alert and appears more rested than two prior mornings. Patient remains confused, initially refusing her medications stating "we've already done this [facility]". Patient was reoriented to time and date, as well as this nurses name. Patient con't to call this nurse "[name]".

## 2013-08-31 NOTE — ED Notes (Signed)
Pt notified she is not d/c home today.  Notified son, [name].  Pt verbalized understanding.

## 2013-08-31 NOTE — ED Notes (Signed)
Per ACT team, pt to hold for son to arrive to discuss treatment plan.  Do not d/c home until family is here and brings home oxygen tank.

## 2013-08-31 NOTE — ED Notes (Signed)
Dinner tray remains at bedside. Patient PO intake poor, only a few bites of her meal was eaten. Will con't to monitor and encourage POs.

## 2013-08-31 NOTE — ED Provider Notes (Signed)
Medical screening examination/treatment/procedure(s) were performed by non-physician practitioner and as supervising physician I was immediately available for consultation/collaboration.   EKG Interpretation None        Ephraim Hamburger, MD 08/31/13 1525

## 2013-08-31 NOTE — BH Assessment (Signed)
Staff from [facility] called to notify Pt has been declined at their facility.  Orpah Greek Rosana Hoes, [facility] Triage Specialist [phone]

## 2013-08-31 NOTE — ED Notes (Signed)
Bed: WA21 Expected date:  Expected time:  Means of arrival:  Comments: Hold for TCU

## 2013-09-01 ENCOUNTER — Ambulatory Visit: Payer: Self-pay | Admitting: Family Medicine

## 2013-09-01 NOTE — ED Notes (Signed)
REPORT GIVEN ABOUT DELAY IN MEDICATIONS AND PT'S REFUSAL

## 2013-09-01 NOTE — Progress Notes (Addendum)
Per discussion with psychiatrist, patient recommended to remain inpatient. Pt pending [facility]. Due to pt oxygen needs, and not meeting geripsych criteria pt options limited.   Noreene Larsson [phone]  ED CSW 09/01/2013 1334pm

## 2013-09-01 NOTE — ED Notes (Signed)
Patient constantly calling nurse into room to do things she is able to do herself.

## 2013-09-01 NOTE — Consult Note (Signed)
[facility] Follow Up Psychiatry Consult   Reason for Consult:  Psychosis  Referring Physician:  EDP JAHNIAH PALLAS is an 53 y.o. female. Total Time spent with patient: 20 minutes  Assessment: AXIS I:  Major Depressive Disorder with psychotic features AXIS II:  Deferred AXIS III:   Past Medical History  Diagnosis Date  . GERD (gastroesophageal reflux disease)   . Hypertension   . 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 CHF (congestive heart failure)     Right side- Dr. Aundra Dubin ([facility])  . Pulmonary HTN   . Anxiety   . Cardiomyopathy   . NSVT (nonsustained ventricular tachycardia)     with syncope: St Jude ICD was implanted. Device now nearing ERI. Given end stage lung disease and normalization of LV function, the device will not be replaced and tachy therapies were turned off  . Chronic chest pain     LHC (08/2008) with no angiographic coronary diease  . Allergic rhinitis   . H/O: iron deficiency anemia   . Secondary amenorrhea     irregular menses  . Psychosis     with high dose steroids  . SVT (supraventricular tachycardia)     possible runs  . Hypokalemia     recurrent  . Rotator cuff sprain   . Chronic back pain    AXIS IV:  other psychosocial or environmental problems and problems related to social environment AXIS V:  21-30 behavior considerably influenced by delusions or hallucinations OR serious impairment in judgment, communication OR inability to function in almost all areas  Plan:  Recommend psychiatric Inpatient admission when medically cleared.  Subjective:   KIMIMILA TAUZIN is a 53 y.o. female patient admitted with acute psychosis  HPI: Patient did well last night; Patient states that she slept well.  Patient does not appear to be doing as well this morning as yesterday. Patient is tearful and stuttering speech.  Patient states that she feels well "I feel okay; I don't think I need to go into the hospital; I just don't want to go home."  Patient states that she is  overwhelmed at home with her daughter.  States that her daughter is unemployed and depends on her to pay for the rent and other things need.  "My daughter is all over the place; I guess I just get overwhelmed having to watch the children.  It is just not working at home. I would rather stay with my son until I can get back on my feet again; but he feels like I have done for his sister than him; I tried to explain to him that he was a man and that he needed to be strong.  I love my kids but It is just not working out at my house."  Patient was shaking, anxious, tearful, and depressed about going back home with her daughter and having to tend to her daughters children.  HPI Elements:   Location:  generalized. Quality:  acute. Severity:  severe. Timing:  past month. Duration:  constant. Context:  stressors.  Past Psychiatric History: Past Medical History  Diagnosis Date  . GERD (gastroesophageal reflux disease)   . Hypertension   . 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 CHF (congestive heart failure)     Right side- Dr. Aundra Dubin ([facility])  . Pulmonary HTN   . Anxiety   . Cardiomyopathy   . NSVT (nonsustained ventricular tachycardia)     with syncope: St  Jude ICD was implanted. Device now nearing ERI. Given end stage lung disease and normalization of LV function, the device will not be replaced and tachy therapies were turned off  . Chronic chest pain     LHC (08/2008) with no angiographic coronary diease  . Allergic rhinitis   . H/O: iron deficiency anemia   . Secondary amenorrhea     irregular menses  . Psychosis     with high dose steroids  . SVT (supraventricular tachycardia)     possible runs  . Hypokalemia     recurrent  . Rotator cuff sprain   . Chronic back pain     reports that she quit smoking about 21 years ago. Her smoking use included Cigarettes. She has a 16.5 pack-year smoking history. She has never used smokeless tobacco. She reports that she drinks alcohol. She reports  that she does not use illicit drugs. Family History  Problem Relation Age of Onset  . Lung cancer Father   . Cancer Father     lung cancer  . Hypertension    . Diabetes Mother     border line  . Hypertension Mother   . Heart failure Mother    Family History Substance Abuse: No Family Supports: Yes, List: (pt son and daugher ) Living Arrangements: Children Can pt return to current living arrangement?: Yes   Allergies:   Allergies  Allergen Reactions  . Latex Rash  . Nitroglycerin Other (See Comments)    Patient is on Revatio  . Ace Inhibitors     unknown  . Meperidine Hcl Other (See Comments)    REACTION: Makes her feel funny  . Other Other (See Comments)    High Dose Steroids cause chemical imbalance and altered mental status  . Tramadol Hcl Other (See Comments)    REACTION: nausea, lightheadedness, sleepiness, and dizziness  . Propoxyphene N-Acetaminophen Rash    REACTION: rash: pruritus    ACT Assessment Complete:  Yes:    Educational Status    Risk to Self: Risk to self Suicidal Ideation: No Suicidal Intent: No Is patient at risk for suicide?: No Suicidal Plan?: No Access to Means: No What has been your use of drugs/alcohol within the last 12 months?: none Previous Attempts/Gestures: No How many times?: 0 Other Self Harm Risks: no Triggers for Past Attempts: None known Intentional Self Injurious Behavior: None Family Suicide History: No Recent stressful life event(s): Other (Comment) Persecutory voices/beliefs?:  (caring for lots of grandchildren) Depression: No Substance abuse history and/or treatment for substance abuse?: No  Risk to Others: Risk to Others Homicidal Ideation: No Thoughts of Harm to Others: No Current Homicidal Intent: No Current Homicidal Plan: No Access to Homicidal Means: No Identified Victim: n/a History of harm to others?: No Assessment of Violence: None Noted Violent Behavior Description: none Does patient have access to  weapons?: No Criminal Charges Pending?: No Does patient have a court date: No  Abuse:    Prior Inpatient Therapy: Prior Inpatient Therapy Prior Inpatient Therapy: Yes Prior Therapy Facilty/Provider(s): [facility]  Reason for Treatment: psychosis  Prior Outpatient Therapy: Prior Outpatient Therapy Prior Outpatient Therapy: No  Additional Information: Additional Information 1:1 In Past 12 Months?: No CIRT Risk: No Elopement Risk: No Does patient have medical clearance?: Yes    Objective: Blood pressure 127/81, pulse 88, temperature 98.7 F (37.1 C), temperature source Oral, resp. rate 18, SpO2 100.00%.There is no weight on file to calculate BMI. No results found for this or any previous  visit (from the past 72 hour(s)). Labs are reviewed.  Medication changes Abilify 2 mg daily, Prozac 20 mg daily, Vistaril Tid prn.  Current Facility-Administered Medications  Medication Dose Route Frequency Provider Last Rate Last Dose  . acetaminophen (TYLENOL) tablet 650 mg  650 mg Oral Q6H PRN Ruthell Rummage Dammen, PA-C      . albuterol (PROVENTIL HFA;VENTOLIN HFA) 108 (90 BASE) MCG/ACT inhaler 2 puff  2 puff Inhalation QID Martie Lee, PA-C   2 puff at 09/01/13 1206  . ARIPiprazole (ABILIFY) tablet 2 mg  2 mg Oral Daily Shuvon Rankin, NP   2 mg at 09/01/13 1005  . aspirin EC tablet 81 mg  81 mg Oral Daily Ruthell Rummage Dammen, PA-C   81 mg at 09/01/13 1004  . budesonide-formoterol (SYMBICORT) 160-4.5 MCG/ACT inhaler 1 puff  1 puff Inhalation BID Martie Lee, PA-C   1 puff at 09/01/13 0751  . carvedilol (COREG) tablet 6.25 mg  6.25 mg Oral BID WC Ruthell Rummage Dammen, PA-C   6.25 mg at 08/31/13 0850  . cephALEXin (KEFLEX) capsule 500 mg  500 mg Oral BID Ruthell Rummage Dammen, PA-C   500 mg at 09/01/13 5427  . ferrous sulfate tablet 650 mg  650 mg Oral QPM Ruthell Rummage Dammen, PA-C   650 mg at 08/31/13 1825  . FLUoxetine (PROZAC) capsule 20 mg  20 mg Oral Daily Shuvon Rankin, NP   20 mg at 09/01/13 1007  . furosemide  (LASIX) tablet 40 mg  40 mg Oral QODAY Peter S Dammen, PA-C   40 mg at 08/31/13 1017  . hydrOXYzine (ATARAX/VISTARIL) tablet 25 mg  25 mg Oral TID PRN Shuvon Rankin, NP   25 mg at 09/01/13 0845  . Iloprost SOLN 20 mcg  20 mcg Inhalation 6 X Daily Martie Lee, PA-C   20 mcg at 09/01/13 0623  . pantoprazole (PROTONIX) EC tablet 80 mg  80 mg Oral Q1200 Ruthell Rummage Dammen, PA-C   80 mg at 09/01/13 1205  . potassium chloride SA (K-DUR,KLOR-CON) CR tablet 40 mEq  40 mEq Oral QODAY Peter S Dammen, PA-C   40 mEq at 08/31/13 1017  . sildenafil (REVATIO) tablet 80 mg  80 mg Oral 3 times per day Martie Lee, PA-C   80 mg at 09/01/13 7628  . traZODone (DESYREL) tablet 100 mg  100 mg Oral QHS,MR X 1 Laverle Hobby, PA-C   100 mg at 08/31/13 2233   Current Outpatient Prescriptions  Medication Sig Dispense Refill  . acetaminophen (TYLENOL) 325 MG tablet Take 650 mg by mouth every 6 (six) hours as needed for mild pain (pain/fever).       Marland Kitchen albuterol (PROAIR HFA) 108 (90 BASE) MCG/ACT inhaler Inhale 2 puffs into the lungs 4 (four) times daily.  8.5 g  11  . aspirin EC 81 MG tablet Take 1 tablet (81 mg total) by mouth daily.  1 tablet  0  . budesonide-formoterol (SYMBICORT) 160-4.5 MCG/ACT inhaler Inhale 1 puff into the lungs 2 (two) times daily.      . carvedilol (COREG) 6.25 MG tablet Take 1 tablet (6.25 mg total) by mouth 2 (two) times daily with a meal.  60 tablet  0  . cephALEXin (KEFLEX) 500 MG capsule Take 1 capsule (500 mg total) by mouth 2 (two) times daily.  20 capsule  0  . esomeprazole (NEXIUM) 40 MG capsule Take 40 mg by mouth every morning.      . ferrous sulfate 325 (65  FE) MG tablet Take 2 tablets (650 mg total) by mouth every evening.  1 tablet  0  . furosemide (LASIX) 40 MG tablet Take 1 tablet (40 mg total) by mouth every other day.  45 tablet  3  . Iloprost 20 MCG/ML SOLN Inhale into the lungs. Ventavis-- 6 times daily - Inhalation  45 mL  2  . potassium chloride SA (K-DUR,KLOR-CON) 20 MEQ  tablet Take 2 tablets (40 mEq total) by mouth every other day. When you take Lasix  270 tablet  2  . risperiDONE (RISPERDAL) 1 MG tablet Take 1 tablet (1 mg total) by mouth at bedtime.  30 tablet  0  . sildenafil (REVATIO) 20 MG tablet Take 4 tablets (80 mg total) by mouth 3 (three) times daily. take 4  tablets every 8 hours  10 tablet  0  . ARIPiprazole (ABILIFY) 2 MG tablet Take 1 tablet (2 mg total) by mouth daily.  30 tablet  1  . PARoxetine (PAXIL) 20 MG tablet Take 1 tablet (20 mg total) by mouth daily.  30 tablet  1  . traZODone (DESYREL) 100 MG tablet Take 1 tablet (100 mg total) by mouth at bedtime as needed for sleep.  30 tablet  1    Psychiatric Specialty Exam:     Blood pressure 127/81, pulse 88, temperature 98.7 F (37.1 C), temperature source Oral, resp. rate 18, SpO2 100.00%.There is no weight on file to calculate BMI.  General Appearance: Casual  Eye Contact::  Fair  Speech:  Normal Rate  Volume:  Normal  Mood:  Depressed  Affect:  Flat  Thought Process:  Circumstantial, Coherent and Goal Directed  Orientation:  Full (Time, Place, and Person)  Thought Content:  "I am just depressed"   Patient conversation was very related and target during interview; no appearance of responding to internal stimuli and denies auditory and visual hallucinations  Suicidal Thoughts:  No  Homicidal Thoughts:  No  Memory:  Immediate;   Fair Recent;   Fair Remote;   Fair  Judgement:  Intact  Insight:  Present  Psychomotor Activity:  Normal  Concentration:  Fair  Recall:  Good  Fund of Knowledge:Good  Language: Good  Akathisia:  No  Handed:  Right  AIMS (if indicated):     Assets:  Housing Resilience Social Support  Sleep:      Musculoskeletal: Strength & Muscle Tone: within normal limits Gait & Station: normal Patient leans: N/A  Treatment Plan Summary: Daily contact with patient to assess and evaluate symptoms and progress in treatment Medication management  Look for  inpatient treatment bed.  Will continue to monitor for safety and stabilization until inpatient bed has been found.  Patient son will meet today with social work to discuss plans once mother is discharged from [place] Rankin, FNP-BC 09/01/2013 3:05 PM

## 2013-09-01 NOTE — ED Notes (Signed)
Patient talking with psychiatrist and Nurse Practitioner.

## 2013-09-01 NOTE — Progress Notes (Signed)
CSW was requested to speak to the patient's son in ED room.  CSW provided the son with a clear explanation of the placement process and the need for state hospital placement.  He is concerned that the financial responsibility of his mother's bills is currently unpaid and he is unable to handle is mother's money.  He reports that he is attempting to have a lawyer complete POA paperwork so he can take care of his mother's bills.  CSW explained that in the patient's current state of confusion it will be difficult to complete the POA paperwork but for him to consult with the attorney.  CSW reassured the family that they will be informed once she is transferred to the next facility for treatment.     Chesley Noon, MSW, [place], 09/01/2013 Evening Clinical Social Worker [phone]

## 2013-09-01 NOTE — ED Notes (Signed)
Patient holding cloth to mouth constantly.  When asked why she does this patient responded that she "had a confession to make."  Nurse asked if patient had discussed this with psychiatrist earlier and she said she had not.  Reminded patient that nurse needed to see her take medication and could not leave medication in room.

## 2013-09-01 NOTE — ED Notes (Signed)
Patient requesting medication for anxiety.  Having difficulty doing her own breathing treatment due to anxiety.  Vistaril given per prn order.

## 2013-09-01 NOTE — ED Notes (Signed)
Bed: OI37 Expected date:  Expected time:  Means of arrival:  Comments: TCU

## 2013-09-01 NOTE — Consult Note (Signed)
Face to face evaluation and I agree with this note 

## 2013-09-01 NOTE — ED Notes (Signed)
Patient reports she does not want to eat her food.

## 2013-09-01 NOTE — ED Notes (Signed)
Pt reports that she feels uncomfortable with having a female sit at her door. Ensured pt that sitter was here to make sure she was safe. Explained to pt that if it made her feel better that we could change out her sitter. CN made aware. Sitter switched out.

## 2013-09-01 NOTE — ED Notes (Signed)
Bed: WA29 Expected date:  Expected time:  Means of arrival:  Comments: TCU

## 2013-09-02 ENCOUNTER — Encounter (HOSPITAL_COMMUNITY): Payer: Self-pay | Admitting: Registered Nurse

## 2013-09-02 NOTE — ED Notes (Signed)
Charge rn spoke to [facility], pt under review with several places. Psych will follow up about placement.

## 2013-09-02 NOTE — Progress Notes (Signed)
CSW met with patient and daughter at bedside to complete a phone conference with the son.  The patient expressed to her family that she would like to return home and have her home health services search for ALF.  She asked her son to increase his involvement with her medical needs such as making sure she get to her appointments and she has the things she needs.  The son reports that he may not be able to increase his involvement but they agreed that he would increase his communication with the patient.  The family has agreed to meet with the hospital team in the morning to confirm the discharge plan before the patient is released.  CSW spoke with the RNCM who will confirm with [facility] the need for a nursing and social work visit.  RNCM will also inform [facility] of the patient's need for a new oxygen tank before returning home.       , MSW, LCSWA, 09/02/2013 Evening Clinical Social Worker [phone] 

## 2013-09-02 NOTE — Consult Note (Signed)
Face to face evaluation and I agree with this note 

## 2013-09-02 NOTE — Consult Note (Signed)
[facility] Follow Up Psychiatry Consult   Reason for Consult:  Psychosis  Referring Physician:  EDP Rachel [name] is an 53 y.o. female. Total Time spent with patient: 20 minutes  Assessment: AXIS I:  Major Depressive Disorder with psychotic features AXIS II:  Deferred AXIS III:   Past Medical History  Diagnosis Date  . GERD (gastroesophageal reflux disease)   . Hypertension   . 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 CHF (congestive heart failure)     Right side- Dr. Aundra Dubin ([facility])  . Pulmonary HTN   . Anxiety   . Cardiomyopathy   . NSVT (nonsustained ventricular tachycardia)     with syncope: St Jude ICD was implanted. Device now nearing ERI. Given end stage lung disease and normalization of LV function, the device will not be replaced and tachy therapies were turned off  . Chronic chest pain     LHC (08/2008) with no angiographic coronary diease  . Allergic rhinitis   . H/O: iron deficiency anemia   . Secondary amenorrhea     irregular menses  . Psychosis     with high dose steroids  . SVT (supraventricular tachycardia)     possible runs  . Hypokalemia     recurrent  . Rotator cuff sprain   . Chronic back pain    AXIS IV:  other psychosocial or environmental problems and problems related to social environment AXIS V:  21-30 behavior considerably influenced by delusions or hallucinations OR serious impairment in judgment, communication OR inability to function in almost all areas  Plan:  Recommend psychiatric Inpatient admission when medically cleared.  Subjective:   Rachel [name] is a 53 y.o. female patient admitted with acute psychosis  HPI: Patient appears to be doing much better today.  Patient did well last night; states that she slept good. Yesterday when patient thought she was being discharged; patient began to decompensate unable to speak with out stuttering and having confusion with conversation and completing sentences.  Patient also was tearful yesterday.   Today patient states that she had spoke with her son, states that she is aware that her living situation is what is causing her stress.  Patient states that she spoke with her son about assisted living and that she was willing to go.  Informed patient that assisted living was something that she and son could continue to work on once she was discharged.  Also discussed how she decompensated when she thought she was going back home yesterday.  Noting that once she was back in the same environment things were not going to change.    Patient son will be here today to talk with SW related to patient treatment plan.  It is recommended that patient remain for inpatient treatment unless she is able to go home with her son to live while working on assisted living placement.  Patient going home to live with her daughter is not recommend and patient begins to decompensate with the thought of going back to living with her daughter.    HPI Elements:   Location:  generalized. Quality:  acute. Severity:  severe. Timing:  past month. Duration:  constant. Context:  stressors.  Past Psychiatric History: Past Medical History  Diagnosis Date  . GERD (gastroesophageal reflux disease)   . Hypertension   . 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 CHF (congestive heart failure)     Right side- Dr. Aundra Dubin ([facility])  .  Pulmonary HTN   . Anxiety   . Cardiomyopathy   . NSVT (nonsustained ventricular tachycardia)     with syncope: St Jude ICD was implanted. Device now nearing ERI. Given end stage lung disease and normalization of LV function, the device will not be replaced and tachy therapies were turned off  . Chronic chest pain     LHC (08/2008) with no angiographic coronary diease  . Allergic rhinitis   . H/O: iron deficiency anemia   . Secondary amenorrhea     irregular menses  . Psychosis     with high dose steroids  . SVT (supraventricular tachycardia)     possible runs  . Hypokalemia     recurrent  .  Rotator cuff sprain   . Chronic back pain     reports that she quit smoking about 21 years ago. Her smoking use included Cigarettes. She has a 16.5 pack-year smoking history. She has never used smokeless tobacco. She reports that she drinks alcohol. She reports that she does not use illicit drugs. Family History  Problem Relation Age of Onset  . Lung cancer Father   . Cancer Father     lung cancer  . Hypertension    . Diabetes Mother     border line  . Hypertension Mother   . Heart failure Mother    Family History Substance Abuse: No Family Supports: Yes, List: (pt son and daugher ) Living Arrangements: Children Can pt return to current living arrangement?: Yes   Allergies:   Allergies  Allergen Reactions  . Latex Rash  . Nitroglycerin Other (See Comments)    Patient is on Revatio  . Ace Inhibitors     unknown  . Meperidine Hcl Other (See Comments)    REACTION: Makes her feel funny  . Other Other (See Comments)    High Dose Steroids cause chemical imbalance and altered mental status  . Tramadol Hcl Other (See Comments)    REACTION: nausea, lightheadedness, sleepiness, and dizziness  . Propoxyphene N-Acetaminophen Rash    REACTION: rash: pruritus    ACT Assessment Complete:  Yes:    Educational Status    Risk to Self: Risk to self Suicidal Ideation: No Suicidal Intent: No Is patient at risk for suicide?: No Suicidal Plan?: No Access to Means: No What has been your use of drugs/alcohol within the last 12 months?: none Previous Attempts/Gestures: No How many times?: 0 Other Self Harm Risks: no Triggers for Past Attempts: None known Intentional Self Injurious Behavior: None Family Suicide History: No Recent stressful life event(s): Other (Comment) Persecutory voices/beliefs?:  (caring for lots of grandchildren) Depression: No Substance abuse history and/or treatment for substance abuse?: No  Risk to Others: Risk to Others Homicidal Ideation: No Thoughts of Harm  to Others: No Current Homicidal Intent: No Current Homicidal Plan: No Access to Homicidal Means: No Identified Victim: n/a History of harm to others?: No Assessment of Violence: None Noted Violent Behavior Description: none Does patient have access to weapons?: No Criminal Charges Pending?: No Does patient have a court date: No  Abuse:    Prior Inpatient Therapy: Prior Inpatient Therapy Prior Inpatient Therapy: Yes Prior Therapy Facilty[facility]Provider[facility]  Reason for Treatment: psychosis  Prior Outpatient Therapy: Prior Outpatient Therapy Prior Outpatient Therapy: No  Additional Information: Additional Information 1:1 In Past 12 Months?: No CIRT Risk: No Elopement Risk: No Does patient have medical clearance?: Yes    Objective: Blood pressure 100/66, pulse 82, temperature 98.2 F (36.8 C),  temperature source Oral, resp. rate 16, SpO2 100.00%.There is no weight on file to calculate BMI. No results found for this or any previous visit (from the past 72 hour(s)). Labs are reviewed.  Medication changes Abilify 2 mg daily, Prozac 20 mg daily, Vistaril Tid prn.  Current Facility-Administered Medications  Medication Dose Route Frequency Provider Last Rate Last Dose  . acetaminophen (TYLENOL) tablet 650 mg  650 mg Oral Q6H PRN Ruthell Rummage Dammen, PA-C      . albuterol (PROVENTIL HFA;VENTOLIN HFA) 108 (90 BASE) MCG/ACT inhaler 2 puff  2 puff Inhalation QID Martie Lee, PA-C   2 puff at 09/02/13 0802  . ARIPiprazole (ABILIFY) tablet 2 mg  2 mg Oral Daily Ace Bergfeld, NP   2 mg at 09/02/13 1154  . aspirin EC tablet 81 mg  81 mg Oral Daily Ruthell Rummage Dammen, PA-C   81 mg at 09/02/13 1154  . budesonide-formoterol (SYMBICORT) 160-4.5 MCG/ACT inhaler 1 puff  1 puff Inhalation BID Martie Lee, PA-C   1 puff at 09/02/13 0801  . carvedilol (COREG) tablet 6.25 mg  6.25 mg Oral BID WC Ruthell Rummage Dammen, PA-C   6.25 mg at 08/31/13 0850  . cephALEXin (KEFLEX) capsule 500 mg  500 mg Oral BID  Ruthell Rummage Dammen, PA-C   500 mg at 09/02/13 1022  . ferrous sulfate tablet 650 mg  650 mg Oral QPM Ruthell Rummage Dammen, PA-C   650 mg at 09/01/13 2032  . FLUoxetine (PROZAC) capsule 20 mg  20 mg Oral Daily Lenny Bouchillon, NP   20 mg at 09/02/13 1154  . furosemide (LASIX) tablet 40 mg  40 mg Oral QODAY Peter S Dammen, PA-C   40 mg at 09/02/13 1021  . hydrOXYzine (ATARAX/VISTARIL) tablet 25 mg  25 mg Oral TID PRN Sylvie Mifsud, NP   25 mg at 09/01/13 0845  . Iloprost SOLN 20 mcg  20 mcg Inhalation 6 X Daily Ruthell Rummage Dammen, PA-C   20 mcg at 09/02/13 1153  . pantoprazole (PROTONIX) EC tablet 80 mg  80 mg Oral Q1200 Ruthell Rummage Dammen, PA-C   80 mg at 09/02/13 1201  . potassium chloride SA (K-DUR,KLOR-CON) CR tablet 40 mEq  40 mEq Oral QODAY Peter S Dammen, PA-C   40 mEq at 09/02/13 1022  . sildenafil (REVATIO) tablet 80 mg  80 mg Oral 3 times per day Martie Lee, PA-C   80 mg at 09/02/13 6754  . traZODone (DESYREL) tablet 100 mg  100 mg Oral QHS,MR X 1 Laverle Hobby, PA-C   100 mg at 09/01/13 2043   Current Outpatient Prescriptions  Medication Sig Dispense Refill  . acetaminophen (TYLENOL) 325 MG tablet Take 650 mg by mouth every 6 (six) hours as needed for mild pain (pain/fever).       Marland Kitchen albuterol (PROAIR HFA) 108 (90 BASE) MCG/ACT inhaler Inhale 2 puffs into the lungs 4 (four) times daily.  8.5 g  11  . aspirin EC 81 MG tablet Take 1 tablet (81 mg total) by mouth daily.  1 tablet  0  . budesonide-formoterol (SYMBICORT) 160-4.5 MCG/ACT inhaler Inhale 1 puff into the lungs 2 (two) times daily.      . carvedilol (COREG) 6.25 MG tablet Take 1 tablet (6.25 mg total) by mouth 2 (two) times daily with a meal.  60 tablet  0  . cephALEXin (KEFLEX) 500 MG capsule Take 1 capsule (500 mg total) by mouth 2 (two) times daily.  20 capsule  0  .  esomeprazole (NEXIUM) 40 MG capsule Take 40 mg by mouth every morning.      . ferrous sulfate 325 (65 FE) MG tablet Take 2 tablets (650 mg total) by mouth every evening.  1  tablet  0  . furosemide (LASIX) 40 MG tablet Take 1 tablet (40 mg total) by mouth every other day.  45 tablet  3  . Iloprost 20 MCG/ML SOLN Inhale into the lungs. Ventavis-- 6 times daily - Inhalation  45 mL  2  . potassium chloride SA (K-DUR,KLOR-CON) 20 MEQ tablet Take 2 tablets (40 mEq total) by mouth every other day. When you take Lasix  270 tablet  2  . risperiDONE (RISPERDAL) 1 MG tablet Take 1 tablet (1 mg total) by mouth at bedtime.  30 tablet  0  . sildenafil (REVATIO) 20 MG tablet Take 4 tablets (80 mg total) by mouth 3 (three) times daily. take 4  tablets every 8 hours  10 tablet  0  . ARIPiprazole (ABILIFY) 2 MG tablet Take 1 tablet (2 mg total) by mouth daily.  30 tablet  1  . PARoxetine (PAXIL) 20 MG tablet Take 1 tablet (20 mg total) by mouth daily.  30 tablet  1  . traZODone (DESYREL) 100 MG tablet Take 1 tablet (100 mg total) by mouth at bedtime as needed for sleep.  30 tablet  1    Psychiatric Specialty Exam:     Blood pressure 100/66, pulse 82, temperature 98.2 F (36.8 C), temperature source Oral, resp. rate 16, SpO2 100.00%.There is no weight on file to calculate BMI.  General Appearance: Casual  Eye Contact::  Fair  Speech:  Normal Rate  Volume:  Normal  Mood:  Depressed  Affect:  Flat  Thought Process:  Circumstantial, Coherent and Goal Directed  Orientation:  Full (Time, Place, and Person)  Thought Content:  "I am just depressed"   Patient conversation was very related and target during interview; no appearance of responding to internal stimuli and denies auditory and visual hallucinations  Suicidal Thoughts:  No  Homicidal Thoughts:  No  Memory:  Immediate;   Fair Recent;   Fair Remote;   Fair  Judgement:  Intact  Insight:  Present  Psychomotor Activity:  Normal  Concentration:  Fair  Recall:  Good  Fund of Knowledge:Good  Language: Good  Akathisia:  No  Handed:  Right  AIMS (if indicated):     Assets:  Housing Resilience Social Support  Sleep:       Musculoskeletal: Strength & Muscle Tone: within normal limits Gait & Station: normal Patient leans: N/A  Treatment Plan Summary: Daily contact with patient to assess and evaluate symptoms and progress in treatment Medication management  Will continue to look for inpatient treatment until patient living arrangement has been made.  Patient will continue with  Inpatient treatment unless patient is able to go home with son to live while working on assisted living placement.   Joely Losier, FNP-BC 09/02/2013 12:47 PM

## 2013-09-02 NOTE — BH Assessment (Signed)
Robinette confirmed that patient is on the wait list at [facility] 09/02/2013 @ 0937.

## 2013-09-02 NOTE — Progress Notes (Addendum)
Per discussion with psychiatrist and NP, patient  Psychiatrically stable for dc home if not returning home with pt daughter as pt daughter is current stressor for patient. Patient son, involved and willing to meet with CSW later today to discuss a discharge plan including daughter moving out, pt living with pt son, or assisted living. Pt son currently at work and unable to assist until later. CSW awaiting return call from pt son regarding time to meet. Pt in agreement with plan.   Noreene Larsson [phone]  ED CSW 09/02/2013 1212pm   Per Marlowe Kays, patient remains on [facility] waitlist.

## 2013-09-02 NOTE — ED Notes (Signed)
Pt refused vital signs taken.

## 2013-09-02 NOTE — ED Notes (Signed)
Pt called out and asked Nursing if she could give her confession.  Pt confused but was ale to reorient herself with some verbal assistance.  Pt requested that Nursing leave the door cracked and knock on door prior to entrance so the "I don't feel like someone is standing over me."  Nursing assured Rachel [name] that this was standard operating procedure.

## 2013-09-02 NOTE — Progress Notes (Signed)
  CARE MANAGEMENT ED NOTE 09/02/2013  Patient:  Rachel [name], Rachel [name]   Account Number:  [id number]  Date Initiated:  09/02/2013  Documentation initiated by:  Westly Pam Assessment:   53 yr old medicare female with 4 ED visits, 1 admission dx Major Depressive Disorder with psychotic features  came to [facility] ED on 08/27/13 with erratic behavior, found wandering in parking lot     Subjective/Objective Assessment Detail:   [facility] and [facility]  [facility] agrees to [facility] with [facility] Provided her with [facility] contact information     Action/Plan:   Additional home health services HHPT/RN/SW/Aide entered Dr Alvino Chapel aware These services wilth resume at d/c   Action/Plan Detail:   Anticipated DC Date:  09/03/2013     Status Recommendation to Physician:   Result of Recommendation:    Other ED Services  Consult Working Plan   In-house referral  Clinical Social Worker   DC Forensic scientist  Other  Outpatient Services - Pt will follow up   [place]   Choice offered to / List presented to:  C-1 Patient  DME arranged  [facility]     DME agency  [place] arranged  HH-1 RN  [place].    Status of service:  Completed, signed off  ED Comments:   ED Comments Detail:

## 2013-09-03 ENCOUNTER — Encounter (HOSPITAL_COMMUNITY): Payer: Self-pay | Admitting: Psychiatry

## 2013-09-03 DIAGNOSIS — F3130 Bipolar disorder, current episode depressed, mild or moderate severity, unspecified: Secondary | ICD-10-CM

## 2013-09-03 MED ORDER — ARIPIPRAZOLE 2 MG PO TABS: 2.0000 mg | ORAL_TABLET | Freq: Every day | ORAL | Status: DC

## 2013-09-03 NOTE — ED Notes (Signed)
Pt called out on the call ell ad stated she was missing a dose of her medication to help her sleep.  Pt was informed and the information was re verified by looking at the [facility], that the patient had indeed received all of the medication she was due.  Nursing believes since pt's PRN medication was given later than her 2200 medications that she was confused.  Pt was redirected without success and she then stated she would go back to sleep

## 2013-09-03 NOTE — Progress Notes (Signed)
09/03/13 1215 CM spoke [place] coordinator for [facility] to provided referral for additional services HHRN, HHPT, HHSW, [place] states face to face needed Spoke with Dr Vanita Panda and guided him to complete face to face Pt is ready for d/c when portable oxygen tank arrives  1200 CM spoke with [place] about bring pt a portable tank of O2 for d/c home

## 2013-09-03 NOTE — BH Assessment (Signed)
Pt meets exclusionary criteria for [facility] due to need for oxygen. Contacted the following facilities for placement:   ON WAIT LIST:  [facility]: Per Robinette   BED AVAILABLE. FAXED CLINICAL INFORMATION:  [place]: Per Iona Beard   AT CAPACITY:  [facility]: Per [facility]: Per [facility]: Per [place]: Per [place]: Per [facility]: Per [facility]: Per [facility]: Per Everlena Cooper  [facility]: Per [facility]: Per [place]: Per Charlton Amor BY:  [facility]   [place], [place], [facility] Triage Specialist [phone]

## 2013-09-03 NOTE — Progress Notes (Signed)
Per son, he is on the way to transport patient home.   Noreene Larsson [phone]  ED CSW 09/03/2013 1340pm

## 2013-09-03 NOTE — [facility] Suicide Risk Assessment (Signed)
Suicide Risk Assessment  Discharge Assessment     Demographic Factors:  Unemployed  Total Time spent with patient: 20 minutes  Psychiatric Specialty Exam:     Blood pressure 124/77, pulse 78, temperature 98.1 F (36.7 C), temperature source Oral, resp. rate 16, SpO2 100.00%.There is no weight on file to calculate BMI.  General Appearance: Casual  Eye Contact::  Good  Speech:  Normal Rate  Volume:  Normal  Mood:  Euthymic  Affect:  Congruent  Thought Process:  Coherent  Orientation:  Full (Time, Place, and Person)  Thought Content:  WDL  Suicidal Thoughts:  No  Homicidal Thoughts:  No  Memory:  Immediate;   Good Recent;   Fair Remote;   Fair  Judgement:  Fair  Insight:  Good  Psychomotor Activity:  Normal  Concentration:  Good  Recall:  Good  Fund of Knowledge:Good  Language: Good  Akathisia:  No  Handed:  Right  AIMS (if indicated):     Assets:  Catering manager Housing Leisure Time Resilience Social Support Transportation  Sleep:       Musculoskeletal: Strength & Muscle Tone: within normal limits Gait & Station: normal Patient leans: N/A   Mental Status Per Nursing Assessment::   On Admission:   Paranoia, not taking her medications  Current Mental Status by Physician: NA  Loss Factors: NA  Historical Factors: NA  Risk Reduction Factors:   Sense of responsibility to family, Living with another person, especially a relative, Positive social support and Positive therapeutic relationship  Continued Clinical Symptoms:  Bipolar Disorder:   Depressive phase  Cognitive Features That Contribute To Risk:  None  Suicide Risk:  Minimal: No identifiable suicidal ideation.  Patients presenting with no risk factors but with morbid ruminations; may be classified as minimal risk based on the severity of the depressive symptoms  Discharge Diagnoses:   AXIS I:  Bipolar, Depressed AXIS II:  Deferred AXIS III:   Past Medical History   Diagnosis Date  . GERD (gastroesophageal reflux disease)   . Hypertension   . 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 CHF (congestive heart failure)     Right side- Dr. Aundra Dubin ([facility])  . Pulmonary HTN   . Anxiety   . Cardiomyopathy   . NSVT (nonsustained ventricular tachycardia)     with syncope: St Jude ICD was implanted. Device now nearing ERI. Given end stage lung disease and normalization of LV function, the device will not be replaced and tachy therapies were turned off  . Chronic chest pain     LHC (08/2008) with no angiographic coronary diease  . Allergic rhinitis   . H/O: iron deficiency anemia   . Secondary amenorrhea     irregular menses  . Psychosis     with high dose steroids  . SVT (supraventricular tachycardia)     possible runs  . Hypokalemia     recurrent  . Rotator cuff sprain   . Chronic back pain    AXIS IV:  other psychosocial or environmental problems, problems related to social environment and problems with primary support group AXIS V:  61-70 mild symptoms  Plan Of Care/Follow-up recommendations:  Activity:  as tolerated Diet:  low-sodium heart healthy diet  Is patient on multiple antipsychotic therapies at discharge:  No   Has Patient had three or more failed trials of antipsychotic monotherapy by history:  No  Recommended Plan for Multiple Antipsychotic Therapies: NA    Waylan Boga, PMH-NP 09/03/2013, 11:53  AM

## 2013-09-03 NOTE — ED Provider Notes (Signed)
Medical screening examination/treatment/procedure(s) were performed by non-physician practitioner and as supervising physician I was immediately available for consultation/collaboration.   Julianne Rice, MD 09/03/13 [phone]

## 2013-09-03 NOTE — ED Notes (Signed)
Family has requested to discard of the patients filled medication for risperidone as the medication has been discontinued. Conversation with the family member, [name], was witnessed by admissions nurse Blima Singer.

## 2013-09-03 NOTE — Progress Notes (Addendum)
CSw met with pt, pt daughter, and pt son regarding pt disposition plan. Pt family arranging things at home and CSW to follow up on delievery of oxygen tank. Patient ina greemtn of plan to dc home and f/u with [facility] for psychiatry and therapy services. Pt interested in assisted living. [facility] for RN, and SW to assist with ALF placement from home. Once oxygen time confirmed, patient family to pick up patient for transport home.   Noreene Larsson [phone]  ED CSW 09/03/2013 1140am   [facility] to bring pt portable tank. Pt dc pending o2 delievry, and pt son to come and pick up patient.   Noreene Larsson [phone]  ED CSW 09/03/2013 1155am

## 2013-09-03 NOTE — Consult Note (Signed)
[place] [facility] Consult   Reason for Consult:  Bipolar disorder Referring Physician:  EDP  Rachel [name] is an 53 y.o. female. Total Time spent with patient: 20 minutes  Assessment: AXIS I:  Bipolar, Depressed AXIS II:  Deferred AXIS III:   Past Medical History  Diagnosis Date  . GERD (gastroesophageal reflux disease)   . Hypertension   . 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 CHF (congestive heart failure)     Right side- Dr. Aundra Dubin ([facility])  . Pulmonary HTN   . Anxiety   . Cardiomyopathy   . NSVT (nonsustained ventricular tachycardia)     with syncope: St Jude ICD was implanted. Device now nearing ERI. Given end stage lung disease and normalization of LV function, the device will not be replaced and tachy therapies were turned off  . Chronic chest pain     LHC (08/2008) with no angiographic coronary diease  . Allergic rhinitis   . H/O: iron deficiency anemia   . Secondary amenorrhea     irregular menses  . Psychosis     with high dose steroids  . SVT (supraventricular tachycardia)     possible runs  . Hypokalemia     recurrent  . Rotator cuff sprain   . Chronic back pain    AXIS IV:  other psychosocial or environmental problems, problems related to social environment and problems with primary support group AXIS V:  61-70 mild symptoms  Plan:  No evidence of imminent risk to self or others at present.    Subjective:   Rachel [name] is a 53 y.o. female patient does not warrant admission.  HPI:  Patient has stabilized on her medications and states she feels much better since she has been taking her medications.  She reports that when she does not take her medications she "brings harm to herself by lying around, not sleeping".  She then becomes confused because of her racing thoughts.  Rachel [name] states she needs to stay on her medications and will continue with her provider at [facility] and her providers at [facility] at [facility] for her psychiatric and medical  care.   Past Psychiatric History: Past Medical History  Diagnosis Date  . GERD (gastroesophageal reflux disease)   . Hypertension   . 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 CHF (congestive heart failure)     Right side- Dr. Aundra Dubin ([facility])  . Pulmonary HTN   . Anxiety   . Cardiomyopathy   . NSVT (nonsustained ventricular tachycardia)     with syncope: St Jude ICD was implanted. Device now nearing ERI. Given end stage lung disease and normalization of LV function, the device will not be replaced and tachy therapies were turned off  . Chronic chest pain     LHC (08/2008) with no angiographic coronary diease  . Allergic rhinitis   . H/O: iron deficiency anemia   . Secondary amenorrhea     irregular menses  . Psychosis     with high dose steroids  . SVT (supraventricular tachycardia)     possible runs  . Hypokalemia     recurrent  . Rotator cuff sprain   . Chronic back pain     reports that she quit smoking about 21 years ago. Her smoking use included Cigarettes. She has a 16.5 pack-year smoking history. She has never used smokeless tobacco. She reports that she drinks alcohol. She reports that she does not use  illicit drugs. Family History  Problem Relation Age of Onset  . Lung cancer Father   . Cancer Father     lung cancer  . Hypertension    . Diabetes Mother     border line  . Hypertension Mother   . Heart failure Mother    Family History Substance Abuse: No Family Supports: Yes, List: (pt son and daugher ) Living Arrangements: Children Can pt return to current living arrangement?: Yes   Allergies:   Allergies  Allergen Reactions  . Latex Rash  . Nitroglycerin Other (See Comments)    Patient is on Revatio  . Ace Inhibitors     unknown  . Meperidine Hcl Other (See Comments)    REACTION: Makes her feel funny  . Other Other (See Comments)    High Dose Steroids cause chemical imbalance and altered mental status  . Tramadol Hcl Other (See Comments)     REACTION: nausea, lightheadedness, sleepiness, and dizziness  . Propoxyphene N-Acetaminophen Rash    REACTION: rash: pruritus    ACT Assessment Complete:  Yes:    Educational Status    Risk to Self: Risk to self Suicidal Ideation: No Suicidal Intent: No Is patient at risk for suicide?: No Suicidal Plan?: No Access to Means: No What has been your use of drugs/alcohol within the last 12 months?: none Previous Attempts/Gestures: No How many times?: 0 Other Self Harm Risks: no Triggers for Past Attempts: None known Intentional Self Injurious Behavior: None Family Suicide History: No Recent stressful life event(s): Other (Comment) Persecutory voices/beliefs?:  (caring for lots of grandchildren) Depression: No Substance abuse history and/or treatment for substance abuse?: No  Risk to Others: Risk to Others Homicidal Ideation: No Thoughts of Harm to Others: No Current Homicidal Intent: No Current Homicidal Plan: No Access to Homicidal Means: No Identified Victim: n/a History of harm to others?: No Assessment of Violence: None Noted Violent Behavior Description: none Does patient have access to weapons?: No Criminal Charges Pending?: No Does patient have a court date: No  Abuse:    Prior Inpatient Therapy: Prior Inpatient Therapy Prior Inpatient Therapy: Yes Prior Therapy Facilty/Provider(s): [facility]  Reason for Treatment: psychosis  Prior Outpatient Therapy: Prior Outpatient Therapy Prior Outpatient Therapy: No  Additional Information: Additional Information 1:1 In Past 12 Months?: No CIRT Risk: No Elopement Risk: No Does patient have medical clearance?: Yes                  Objective: Blood pressure 124/77, pulse 78, temperature 98.1 F (36.7 C), temperature source Oral, resp. rate 16, SpO2 100.00%.There is no weight on file to calculate BMI.No results found for this or any previous visit (from the past 72 hour(s)). Labs are reviewed and are  pertinent for medical issues being addressed.  Current Facility-Administered Medications  Medication Dose Route Frequency Provider Last Rate Last Dose  . acetaminophen (TYLENOL) tablet 650 mg  650 mg Oral Q6H PRN Martie Lee, PA-C   650 mg at 09/02/13 1629  . albuterol (PROVENTIL HFA;VENTOLIN HFA) 108 (90 BASE) MCG/ACT inhaler 2 puff  2 puff Inhalation QID Martie Lee, PA-C   2 puff at 09/03/13 [phone]  . ARIPiprazole (ABILIFY) tablet 2 mg  2 mg Oral Daily Shuvon Rankin, NP   2 mg at 09/03/13 0903  . aspirin EC tablet 81 mg  81 mg Oral Daily Martie Lee, PA-C   81 mg at 09/03/13 6378  . budesonide-formoterol (SYMBICORT) 160-4.5 MCG/ACT inhaler 1 puff  1  puff Inhalation BID Martie Lee, PA-C   1 puff at 09/03/13 [phone]  . carvedilol (COREG) tablet 6.25 mg  6.25 mg Oral BID WC Ruthell Rummage Dammen, PA-C   6.25 mg at 08/31/13 0850  . cephALEXin (KEFLEX) capsule 500 mg  500 mg Oral BID Ruthell Rummage Dammen, PA-C   500 mg at 09/03/13 3500  . ferrous sulfate tablet 650 mg  650 mg Oral QPM Ruthell Rummage Dammen, PA-C   650 mg at 09/02/13 1906  . FLUoxetine (PROZAC) capsule 20 mg  20 mg Oral Daily Shuvon Rankin, NP   20 mg at 09/03/13 0903  . furosemide (LASIX) tablet 40 mg  40 mg Oral QODAY Peter S Dammen, PA-C   40 mg at 09/02/13 1021  . hydrOXYzine (ATARAX/VISTARIL) tablet 25 mg  25 mg Oral TID PRN Shuvon Rankin, NP   25 mg at 09/03/13 0915  . Iloprost SOLN 20 mcg  20 mcg Inhalation 6 X Daily Martie Lee, PA-C   20 mcg at 09/02/13 2030  . pantoprazole (PROTONIX) EC tablet 80 mg  80 mg Oral Q1200 Ruthell Rummage Dammen, PA-C   80 mg at 09/02/13 1201  . potassium chloride SA (K-DUR,KLOR-CON) CR tablet 40 mEq  40 mEq Oral QODAY Peter S Dammen, PA-C   40 mEq at 09/02/13 1022  . sildenafil (REVATIO) tablet 80 mg  80 mg Oral 3 times per day Martie Lee, PA-C   80 mg at 09/02/13 2229  . traZODone (DESYREL) tablet 100 mg  100 mg Oral QHS,MR X 1 Laverle Hobby, PA-C   100 mg at 09/02/13 2133   Current Outpatient  Prescriptions  Medication Sig Dispense Refill  . acetaminophen (TYLENOL) 325 MG tablet Take 650 mg by mouth every 6 (six) hours as needed for mild pain (pain/fever).       Marland Kitchen albuterol (PROAIR HFA) 108 (90 BASE) MCG/ACT inhaler Inhale 2 puffs into the lungs 4 (four) times daily.  8.5 g  11  . aspirin EC 81 MG tablet Take 1 tablet (81 mg total) by mouth daily.  1 tablet  0  . budesonide-formoterol (SYMBICORT) 160-4.5 MCG/ACT inhaler Inhale 1 puff into the lungs 2 (two) times daily.      . carvedilol (COREG) 6.25 MG tablet Take 1 tablet (6.25 mg total) by mouth 2 (two) times daily with a meal.  60 tablet  0  . cephALEXin (KEFLEX) 500 MG capsule Take 1 capsule (500 mg total) by mouth 2 (two) times daily.  20 capsule  0  . esomeprazole (NEXIUM) 40 MG capsule Take 40 mg by mouth every morning.      . ferrous sulfate 325 (65 FE) MG tablet Take 2 tablets (650 mg total) by mouth every evening.  1 tablet  0  . furosemide (LASIX) 40 MG tablet Take 1 tablet (40 mg total) by mouth every other day.  45 tablet  3  . Iloprost 20 MCG/ML SOLN Inhale into the lungs. Ventavis-- 6 times daily - Inhalation  45 mL  2  . potassium chloride SA (K-DUR,KLOR-CON) 20 MEQ tablet Take 2 tablets (40 mEq total) by mouth every other day. When you take Lasix  270 tablet  2  . risperiDONE (RISPERDAL) 1 MG tablet Take 1 tablet (1 mg total) by mouth at bedtime.  30 tablet  0  . sildenafil (REVATIO) 20 MG tablet Take 4 tablets (80 mg total) by mouth 3 (three) times daily. take 4  tablets every 8 hours  10 tablet  0  . ARIPiprazole (ABILIFY) 2 MG tablet Take 1 tablet (2 mg total) by mouth daily.  30 tablet  1  . PARoxetine (PAXIL) 20 MG tablet Take 1 tablet (20 mg total) by mouth daily.  30 tablet  1  . traZODone (DESYREL) 100 MG tablet Take 1 tablet (100 mg total) by mouth at bedtime as needed for sleep.  30 tablet  1   Psychiatric Specialty Exam:     Blood pressure 124/77, pulse 78, temperature 98.1 F (36.7 C), temperature source  Oral, resp. rate 16, SpO2 100.00%.There is no weight on file to calculate BMI.  General Appearance: Casual  Eye Contact::  Good  Speech:  Normal Rate  Volume:  Normal  Mood:  Euthymic  Affect:  Congruent  Thought Process:  Coherent  Orientation:  Full (Time, Place, and Person)  Thought Content:  WDL  Suicidal Thoughts:  No  Homicidal Thoughts:  No  Memory:  Immediate;   Good Recent;   Fair Remote;   Fair  Judgement:  Fair  Insight:  Good  Psychomotor Activity:  Normal  Concentration:  Good  Recall:  Good  Fund of Knowledge:Good  Language: Good  Akathisia:  No  Handed:  Right  AIMS (if indicated):     Assets:  Financial Resources/Insurance Housing Leisure Time Resilience Social Support Transportation  Sleep:       Musculoskeletal: Strength & Muscle Tone: within normal limits Gait & Station: normal Patient leans: N/A  Treatment Plan Summary: Discharge home with follow-up with her regular provider at [facility].  Waylan Boga, PMH-NP 09/03/2013 11:57 AM  I have personally seen the patient and agreed with the findings and involved in the treatment plan. Berniece Andreas, MD

## 2013-09-03 NOTE — Progress Notes (Signed)
CSW and RN CM met with pt and pt son at bedside to answer questions and concerns. CSW provided supportive counseling and deesclation as pt was experiencing anxiety about leaving the hospital. Pt son providing transport for patient home with portable oxygen.  No further Clinical Social Work needs, signing off.   Noreene Larsson [phone]  ED CSW 09/03/2013 1504pm

## 2013-09-09 ENCOUNTER — Encounter: Payer: Self-pay | Admitting: Family Medicine

## 2013-09-09 NOTE — Progress Notes (Signed)
Patient dropped off FL-2 to be filled out.  Please call her social worker Cindie Sillmon at [phone] when ready to be picked up.

## 2013-09-10 ENCOUNTER — Telehealth: Payer: Self-pay | Admitting: Clinical

## 2013-09-10 NOTE — Telephone Encounter (Signed)
CSW received a request for an FL2 to be completed. CSW contacted Cindie, the social worker with [facility] who confirmed that pts family is wanting placement (ALF or group home)for pt. CSW was also made aware that pt has an ACT team case worker named [name]. CSW informed Cindie that CSW and PCP would complete pt FL2 and CSW to fax pt out to ALF's in [facility]. Cindie appreciative.  CSW to contact Cindie once FL2 is completed. PCP aware of the above.  [name], MSW, [place]

## 2013-09-10 NOTE — ED Provider Notes (Signed)
Medical screening examination/treatment/procedure(s) were performed by non-physician practitioner and as supervising physician I was immediately available for consultation/collaboration.   Dorie Rank, MD 09/10/13 [phone]

## 2013-09-10 NOTE — Telephone Encounter (Signed)
CSW left a message for Cindie informing her that the [facility] is ready for pick-up.   CSW has faxed pt information out to facilities in [facility] and submitted for a Sports administrator.  [name], MSW, [place]

## 2013-09-13 NOTE — Progress Notes (Signed)
Form given to [name] to complete and return to patient's social worker.Rachel [name]

## 2013-09-15 ENCOUNTER — Telehealth: Payer: Self-pay | Admitting: Clinical

## 2013-09-15 NOTE — Telephone Encounter (Signed)
CSW received a call from the [facility] that she would be assessing pt today.  CSW left a message for Cindie with [facility] notifying her.  [name], MSW, [place]

## 2013-09-16 ENCOUNTER — Telehealth: Payer: Self-pay | Admitting: Clinical

## 2013-09-16 NOTE — Telephone Encounter (Signed)
Clinical Education officer, museum (CSW) contacted the evaluator Hoyle Sauer with [facility]. Cecille Rubin informed CSW that the pasrr was cancelled as pt has declined placement. At the moment pt has has capacity and therefore the evaluator could not proceed with a pasarr#.  CSW confirmed with Cindie from [facility] that pt will remain in the home until she becomes agreeable to placement and or lacks capacity.  [name], MSW, [place]

## 2013-09-19 ENCOUNTER — Emergency Department (HOSPITAL_COMMUNITY)
Admission: EM | Admit: 2013-09-19 | Discharge: 2013-09-19 | Disposition: A | Discharge status: Home / Self Care | Payer: Medicare Other | Attending: Emergency Medicine | Admitting: Emergency Medicine

## 2013-09-19 ENCOUNTER — Encounter (HOSPITAL_COMMUNITY): Payer: Self-pay | Admitting: Emergency Medicine

## 2013-09-19 DIAGNOSIS — Z8739 Personal history of other diseases of the musculoskeletal system and connective tissue: Secondary | ICD-10-CM | POA: Insufficient documentation

## 2013-09-19 DIAGNOSIS — I10 Essential (primary) hypertension: Secondary | ICD-10-CM | POA: Insufficient documentation

## 2013-09-19 DIAGNOSIS — I509 Heart failure, unspecified: Secondary | ICD-10-CM | POA: Insufficient documentation

## 2013-09-19 DIAGNOSIS — R635 Abnormal weight gain: Secondary | ICD-10-CM

## 2013-09-19 DIAGNOSIS — Z91040 Latex allergy status: Secondary | ICD-10-CM | POA: Insufficient documentation

## 2013-09-19 DIAGNOSIS — Z87891 Personal history of nicotine dependence: Secondary | ICD-10-CM | POA: Insufficient documentation

## 2013-09-19 DIAGNOSIS — G8929 Other chronic pain: Secondary | ICD-10-CM | POA: Insufficient documentation

## 2013-09-19 DIAGNOSIS — K219 Gastro-esophageal reflux disease without esophagitis: Secondary | ICD-10-CM | POA: Insufficient documentation

## 2013-09-19 DIAGNOSIS — Z7982 Long term (current) use of aspirin: Secondary | ICD-10-CM | POA: Insufficient documentation

## 2013-09-19 NOTE — ED Notes (Signed)
Pt arrived to the Ed with a complaint of anxiety.  According to EMS they were called for a shortness of breath call.  Pt was found to be hyperventilating.  Pt has a psych hx.  Pt has anxiety.  Pt is on O2 continuously.  Pt states repeatitly that she got up,pulled the home assistance device out from under her night stand, tapped it with her foot, went to the bathroom weighted herself then used her blood pressure cuff.  It is believed by Nursing that this is a advanced home alert system.   Pt then has a blank state as to why she came to the ED

## 2013-09-19 NOTE — Discharge Instructions (Signed)
Please take 18m of your fluid pill for the next three days (monday, Tuesday, Wednesday), then resume your normal dosing   (1 pill in your Tuesday box when you return home)   Heart Failure Heart failure is a condition in which the heart has trouble pumping blood. This means your heart does not pump blood efficiently for your body to work well. In some cases of heart failure, fluid may back up into your lungs or you may have swelling (edema) in your lower legs. Heart failure is usually a long-term (chronic) condition. It is important for you to take good care of yourself and follow your caregiver's treatment plan. CAUSES  Some health conditions can cause heart failure. Those health conditions include:  High blood pressure (hypertension) causes the heart muscle to work harder than normal. When pressure in the blood vessels is high, the heart needs to pump (contract) with more force in order to circulate blood throughout the body. High blood pressure eventually causes the heart to become stiff and weak.  Coronary artery disease (CAD) is the buildup of cholesterol and fat (plaque) in the arteries of the heart. The blockage in the arteries deprives the heart muscle of oxygen and blood. This can cause chest pain and may lead to a heart attack. High blood pressure can also contribute to CAD.  Heart attack (myocardial infarction) occurs when 1 or more arteries in the heart become blocked. The loss of oxygen damages the muscle tissue of the heart. When this happens, part of the heart muscle dies. The injured tissue does not contract as well and weakens the heart's ability to pump blood.  Abnormal heart valves can cause heart failure when the heart valves do not open and close properly. This makes the heart muscle pump harder to keep the blood flowing.  Heart muscle disease (cardiomyopathy or myocarditis) is damage to the heart muscle from a variety of causes. These can include drug or alcohol abuse,  infections, or unknown reasons. These can increase the risk of heart failure.  Lung disease makes the heart work harder because the lungs do not work properly. This can cause a strain on the heart, leading it to fail.  Diabetes increases the risk of heart failure. High blood sugar contributes to high fat (lipid) levels in the blood. Diabetes can also cause slow damage to tiny blood vessels that carry important nutrients to the heart muscle. When the heart does not get enough oxygen and food, it can cause the heart to become weak and stiff. This leads to a heart that does not contract efficiently.  Other conditions can contribute to heart failure. These include abnormal heart rhythms, thyroid problems, and low blood counts (anemia). Certain unhealthy behaviors can increase the risk of heart failure. Those unhealthy behaviors include:  Being overweight.  Smoking or chewing tobacco.  Eating foods high in fat and cholesterol.  Abusing illicit drugs or alcohol.  Lacking physical activity. SYMPTOMS  Heart failure symptoms may vary and can be hard to detect. Symptoms may include:  Shortness of breath with activity, such as climbing stairs.  Persistent cough.  Swelling of the feet, ankles, legs, or abdomen.  Unexplained weight gain.  Difficulty breathing when lying flat (orthopnea).  Waking from sleep because of the need to sit up and get more air.  Rapid heartbeat.  Fatigue and loss of energy.  Feeling lightheaded, dizzy, or close to fainting.  Loss of appetite.  Nausea.  Increased urination during the night (nocturia). DIAGNOSIS  A  diagnosis of heart failure is based on your history, symptoms, physical examination, and diagnostic tests. Diagnostic tests for heart failure may include:  Echocardiography.  Electrocardiography.  Chest X-ray.  Blood tests.  Exercise stress test.  Cardiac angiography.  Radionuclide scans. TREATMENT  Treatment is aimed at managing the  symptoms of heart failure. Medicines, behavioral changes, or surgical intervention may be necessary to treat heart failure.  Medicines to help treat heart failure may include:  Angiotensin-converting enzyme (ACE) inhibitors. This type of medicine blocks the effects of a blood protein called angiotensin-converting enzyme. ACE inhibitors relax (dilate) the blood vessels and help lower blood pressure.  Angiotensin receptor blockers. This type of medicine blocks the actions of a blood protein called angiotensin. Angiotensin receptor blockers dilate the blood vessels and help lower blood pressure.  Water pills (diuretics). Diuretics cause the kidneys to remove salt and water from the blood. The extra fluid is removed through urination. This loss of extra fluid lowers the volume of blood the heart pumps.  Beta blockers. These prevent the heart from beating too fast and improve heart muscle strength.  Digitalis. This increases the force of the heartbeat.  Healthy behavior changes include:  Obtaining and maintaining a healthy weight.  Stopping smoking or chewing tobacco.  Eating heart healthy foods.  Limiting or avoiding alcohol.  Stopping illicit drug use.  Physical activity as directed by your caregiver.  Surgical treatment for heart failure may include:  A procedure to open blocked arteries, repair damaged heart valves, or remove damaged heart muscle tissue.  A pacemaker to improve heart muscle function and control certain abnormal heart rhythms.  An internal cardioverter defibrillator to treat certain serious abnormal heart rhythms.  A left ventricular assist device to assist the pumping ability of the heart. HOME CARE INSTRUCTIONS   Take your medicine as directed by your caregiver. Medicines are important in reducing the workload of your heart, slowing the progression of heart failure, and improving your symptoms.  Do not stop taking your medicine unless directed by your  caregiver.  Do not skip any dose of medicine.  Refill your prescriptions before you run out of medicine. Your medicines are needed every day.  Take over-the-counter medicine only as directed by your caregiver or pharmacist.  Engage in moderate physical activity if directed by your caregiver. Moderate physical activity can benefit some people. The elderly and people with severe heart failure should consult with a caregiver for physical activity recommendations.  Eat heart healthy foods. Food choices should be free of trans fat and low in saturated fat, cholesterol, and salt (sodium). Healthy choices include fresh or frozen fruits and vegetables, fish, lean meats, legumes, fat-free or low-fat dairy products, and whole grain or high fiber foods. Talk to a dietitian to learn more about heart healthy foods.  Limit sodium if directed by your caregiver. Sodium restriction may reduce symptoms of heart failure in some people. Talk to a dietitian to learn more about heart healthy seasonings.  Use healthy cooking methods. Healthy cooking methods include roasting, grilling, broiling, baking, poaching, steaming, or stir-frying. Talk to a dietitian to learn more about healthy cooking methods.  Limit fluids if directed by your caregiver. Fluid restriction may reduce symptoms of heart failure in some people.  Weigh yourself every day. Daily weights are important in the early recognition of excess fluid. You should weigh yourself every morning after you urinate and before you eat breakfast. Wear the same amount of clothing each time you weigh yourself. Record your daily  weight. Provide your caregiver with your weight record.  Monitor and record your blood pressure if directed by your caregiver.  Check your pulse if directed by your caregiver.  Lose weight if directed by your caregiver. Weight loss may reduce symptoms of heart failure in some people.  Stop smoking or chewing tobacco. Nicotine makes your  heart work harder by causing your blood vessels to constrict. Do not use nicotine gum or patches before talking to your caregiver.  Schedule and attend follow-up visits as directed by your caregiver. It is important to keep all your appointments.  Limit alcohol intake to no more than 1 drink per day for nonpregnant women and 2 drinks per day for men. Drinking more than that is harmful to your heart. Tell your caregiver if you drink alcohol several times a week. Talk with your caregiver about whether alcohol is safe for you. If your heart has already been damaged by alcohol or you have severe heart failure, drinking alcohol should be stopped completely.  Stop illicit drug use.  Stay up-to-date with immunizations. It is especially important to prevent respiratory infections through current pneumococcal and influenza immunizations.  Manage other health conditions such as hypertension, diabetes, thyroid disease, or abnormal heart rhythms as directed by your caregiver.  Learn to manage stress.  Plan rest periods when fatigued.  Learn strategies to manage high temperatures. If the weather is extremely hot:  Avoid vigorous physical activity.  Use air conditioning or fans or seek a cooler location.  Avoid caffeine and alcohol.  Wear loose-fitting, lightweight, and light-colored clothing.  Learn strategies to manage cold temperatures. If the weather is extremely cold:  Avoid vigorous physical activity.  Layer clothes.  Wear mittens or gloves, a hat, and a scarf when going outside.  Avoid alcohol.  Obtain ongoing education and support as needed.  Participate or seek rehabilitation as needed to maintain or improve independence and quality of life. SEEK MEDICAL CARE IF:   Your weight increases by 03 lb/1.4 kg in 1 day or 05 lb/2.3 kg in a week.  You have increasing shortness of breath that is unusual for you.  You are unable to participate in your usual physical activities.  You  tire easily.  You cough more than normal, especially with physical activity.  You have any or more swelling in areas such as your hands, feet, ankles, or abdomen.  You are unable to sleep because it is hard to breathe.  You feel like your heart is beating fast (palpitations).  You become dizzy or lightheaded upon standing up. SEEK IMMEDIATE MEDICAL CARE IF:   You have difficulty breathing.  There is a change in mental status such as decreased alertness or difficulty with concentration.  You have a pain or discomfort in your chest.  You have an episode of fainting (syncope). MAKE SURE YOU:   Understand these instructions.  Will watch your condition.  Will get help right away if you are not doing well or get worse. Document Released: 03/25/2005 Document Revised: 07/20/2012 Document Reviewed: 04/16/2012 [facility] Patient Information 2014 [place], [place].

## 2013-09-19 NOTE — ED Notes (Signed)
Bed: [facility]PT2 Expected date:  Expected time:  Means of arrival:  Comments: 

## 2013-09-19 NOTE — ED Provider Notes (Signed)
CSN: [id number]     Arrival date & time 09/19/13  0607 History   First MD Initiated Contact with Patient 09/19/13 [phone]     Chief Complaint  Patient presents with  . Anxiety      HPI The patient reports that an advanced home health is monitoring her daily weights.  She was called yesterday and was told that she had a 4 pound weight gain.  This is being monitored for congestive heart failure.  She currently takes 40 mg of Lasix every other day.  She denies shortness of breath at this time.  She states that she became somewhat anxious and panicked at home this morning the patient did not know what to do.  She has no complaints at this time.  She does report some mild increase of her bilateral lower extremities she denies orthopnea.  She states she feels much better.   Past Medical History  Diagnosis Date  . GERD (gastroesophageal reflux disease)   . Hypertension   . 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 CHF (congestive heart failure)     Right side- Dr. Aundra Dubin ([place])  . Pulmonary HTN   . Anxiety   . Cardiomyopathy   . NSVT (nonsustained ventricular tachycardia)     with syncope: St Jude ICD was implanted. Device now nearing ERI. Given end stage lung disease and normalization of LV function, the device will not be replaced and tachy therapies were turned off  . Chronic chest pain     LHC (08/2008) with no angiographic coronary diease  . Allergic rhinitis   . H/O: iron deficiency anemia   . Secondary amenorrhea     irregular menses  . Psychosis     with high dose steroids  . SVT (supraventricular tachycardia)     possible runs  . Hypokalemia     recurrent  . Rotator cuff sprain   . Chronic back pain    Past Surgical History  Procedure Laterality Date  . Cardiac catheterization  08/26/2008    5% EF with normal coronary arteries and normal wall motion  . Adenosine cardiolyte  03/31/2003    no ischemia/infarct; marked global LV hypekinesis; resting EF 22%  . Cardiac  catheterization  03/31/2003    globally depressed LV fxn with EF 20%; idiopathic dilated cardiomyopathy  . Defibrillator placed  03/31/2003    St. Jude single chamber (Dr. Cristopher Peru)  . Pfts      FVC 2000 (56%) FEV1 1400 (47%), ratio 0.7; insignif response to bronchodilatior; stable from 1/05 - 9/05; PFTs: Mod restriction, FVC 1.9L, FEV1 1.6L, both 53% predicted; slt response to brochodilators 04/08/2002   Family History  Problem Relation Age of Onset  . Lung cancer Father   . Cancer Father     lung cancer  . Hypertension    . Diabetes Mother     border line  . Hypertension Mother   . Heart failure Mother    History  Substance Use Topics  . Smoking status: Former Smoker -- 1.50 packs/day for 11 years    Types: Cigarettes    Quit date: 04/08/1992  . Smokeless tobacco: Never Used     Comment: 1ppd x 20 years  . Alcohol Use: Yes     Comment: remote history of alcohol abuse (quit 1994)   OB History   Grav Para Term Preterm Abortions TAB SAB Ect Mult Living  Review of Systems  All other systems reviewed and are negative.     Allergies  Latex; Nitroglycerin; Ace inhibitors; Meperidine hcl; Other; Tramadol hcl; and Propoxyphene n-acetaminophen  Home Medications   Prior to Admission medications   Medication Sig Start Date End Date Taking? Authorizing Provider  acetaminophen (TYLENOL) 500 MG tablet Take 500 mg by mouth every 6 (six) hours as needed.   Yes Historical Provider, MD  albuterol (PROAIR HFA) 108 (90 BASE) MCG/ACT inhaler Inhale 2 puffs into the lungs 4 (four) times daily. 08/21/13  Yes Waylan Boga, NP  ARIPiprazole (ABILIFY) 2 MG tablet Take 1 tablet (2 mg total) by mouth daily. 09/03/13  Yes Waylan Boga, NP  aspirin EC 81 MG tablet Take 1 tablet (81 mg total) by mouth daily. 08/21/13  Yes Waylan Boga, NP  budesonide-formoterol (SYMBICORT) 160-4.5 MCG/ACT inhaler Inhale 1 puff into the lungs 2 (two) times daily.   Yes Historical Provider, MD   carvedilol (COREG) 6.25 MG tablet Take 1 tablet (6.25 mg total) by mouth 2 (two) times daily with a meal. 08/21/13  Yes Waylan Boga, NP  esomeprazole (NEXIUM) 40 MG capsule Take 40 mg by mouth every morning.   Yes Historical Provider, MD  ferrous sulfate 325 (65 FE) MG tablet Take 2 tablets (650 mg total) by mouth every evening. 08/21/13  Yes Waylan Boga, NP  furosemide (LASIX) 40 MG tablet Take 1 tablet (40 mg total) by mouth every other day. 08/21/13  Yes Waylan Boga, NP  Iloprost 20 MCG/ML SOLN Inhale into the lungs. Ventavis-- 6 times daily - Inhalation 08/21/13  Yes Waylan Boga, NP  PARoxetine (PAXIL) 20 MG tablet Take 1 tablet (20 mg total) by mouth daily. 08/31/13  Yes Shuvon Rankin, NP  potassium chloride SA (K-DUR,KLOR-CON) 20 MEQ tablet Take 20-40 mEq by mouth 2 (two) times daily. 40 meq in the morning and 20 meq at night   Yes Historical Provider, MD  sildenafil (REVATIO) 20 MG tablet Take 4 tablets (80 mg total) by mouth 3 (three) times daily. take 4  tablets every 8 hours 08/21/13  Yes Waylan Boga, NP  sodium chloride (OCEAN) 0.65 % SOLN nasal spray Place 1 spray into both nostrils as needed for congestion.   Yes Historical Provider, MD  traZODone (DESYREL) 100 MG tablet Take 1 tablet (100 mg total) by mouth at bedtime as needed for sleep. 08/31/13  Yes Shuvon Rankin, NP   BP 159/84  Pulse 71  Temp(Src) 98.6 F (37 C) (Oral)  Resp 18  SpO2 99% Physical Exam  Nursing note and vitals reviewed. Constitutional: She is oriented to person, place, and time. She appears well-developed and well-nourished. No distress.  HENT:  Head: Normocephalic and atraumatic.  Eyes: EOM are normal.  Neck: Normal range of motion.  Cardiovascular: Normal rate, regular rhythm and normal heart sounds.   Pulmonary/Chest: Effort normal and breath sounds normal.  Abdominal: Soft. She exhibits no distension. There is no tenderness.  Musculoskeletal: Normal range of motion.  1+ pitting edema bilaterally   Neurological: She is alert and oriented to person, place, and time.  Skin: Skin is warm and dry.  Psychiatric: She has a normal mood and affect. Judgment normal.    ED Course  Procedures (including critical care time) Labs Review Labs Reviewed - No data to display  Imaging Review No results found.   EKG Interpretation None      MDM   Final diagnoses:  Weight gain  CHF (congestive heart failure)    Patient has  a history of congestive heart failure and weight gain.  I have increased her Lasix to daily over the next 4 days.  She took an extra dose this morning which was normally a nonscheduled medication today.  She has no signs of congestive heart failure at this time.  She is on her normal home O2 at this time.  Discharge home in good condition.  I have asked that she followup with cardiology and primary care physician.  She understands to return to the ER for new or worsening symptoms    Hoy Morn, MD 09/19/13 [phone]

## 2013-09-19 NOTE — ED Notes (Signed)
[facility] at bedside

## 2013-09-19 NOTE — ED Notes (Signed)
[facility] called for transport d/t pt use of oxygen.

## 2013-09-20 ENCOUNTER — Emergency Department (HOSPITAL_COMMUNITY)
Admission: EM | Admit: 2013-09-20 | Discharge: 2013-09-20 | Disposition: A | Discharge status: Home / Self Care | Payer: Medicare Other | Attending: Emergency Medicine | Admitting: Emergency Medicine

## 2013-09-20 ENCOUNTER — Emergency Department (HOSPITAL_COMMUNITY): Payer: Medicare Other

## 2013-09-20 DIAGNOSIS — I472 Ventricular tachycardia, unspecified: Secondary | ICD-10-CM | POA: Insufficient documentation

## 2013-09-20 DIAGNOSIS — I429 Cardiomyopathy, unspecified: Secondary | ICD-10-CM | POA: Insufficient documentation

## 2013-09-20 DIAGNOSIS — Z91040 Latex allergy status: Secondary | ICD-10-CM | POA: Insufficient documentation

## 2013-09-20 DIAGNOSIS — R0602 Shortness of breath: Secondary | ICD-10-CM

## 2013-09-20 DIAGNOSIS — D869 Sarcoidosis, unspecified: Secondary | ICD-10-CM | POA: Insufficient documentation

## 2013-09-20 DIAGNOSIS — M549 Dorsalgia, unspecified: Secondary | ICD-10-CM | POA: Insufficient documentation

## 2013-09-20 DIAGNOSIS — I509 Heart failure, unspecified: Secondary | ICD-10-CM | POA: Insufficient documentation

## 2013-09-20 DIAGNOSIS — I498 Other specified cardiac arrhythmias: Secondary | ICD-10-CM | POA: Insufficient documentation

## 2013-09-20 DIAGNOSIS — I2789 Other specified pulmonary heart diseases: Secondary | ICD-10-CM | POA: Insufficient documentation

## 2013-09-20 DIAGNOSIS — Z862 Personal history of diseases of the blood and blood-forming organs and certain disorders involving the immune mechanism: Secondary | ICD-10-CM | POA: Insufficient documentation

## 2013-09-20 DIAGNOSIS — Z79899 Other long term (current) drug therapy: Secondary | ICD-10-CM | POA: Insufficient documentation

## 2013-09-20 DIAGNOSIS — Z87891 Personal history of nicotine dependence: Secondary | ICD-10-CM | POA: Insufficient documentation

## 2013-09-20 DIAGNOSIS — Z888 Allergy status to other drugs, medicaments and biological substances status: Secondary | ICD-10-CM | POA: Insufficient documentation

## 2013-09-20 DIAGNOSIS — R079 Chest pain, unspecified: Secondary | ICD-10-CM | POA: Insufficient documentation

## 2013-09-20 DIAGNOSIS — I10 Essential (primary) hypertension: Secondary | ICD-10-CM | POA: Insufficient documentation

## 2013-09-20 DIAGNOSIS — F411 Generalized anxiety disorder: Secondary | ICD-10-CM | POA: Insufficient documentation

## 2013-09-20 DIAGNOSIS — G8929 Other chronic pain: Secondary | ICD-10-CM | POA: Insufficient documentation

## 2013-09-20 DIAGNOSIS — F29 Unspecified psychosis not due to a substance or known physiological condition: Secondary | ICD-10-CM | POA: Insufficient documentation

## 2013-09-20 DIAGNOSIS — K219 Gastro-esophageal reflux disease without esophagitis: Secondary | ICD-10-CM | POA: Insufficient documentation

## 2013-09-20 DIAGNOSIS — J309 Allergic rhinitis, unspecified: Secondary | ICD-10-CM | POA: Insufficient documentation

## 2013-09-20 DIAGNOSIS — I4729 Other ventricular tachycardia: Secondary | ICD-10-CM | POA: Insufficient documentation

## 2013-09-20 LAB — CBC WITH DIFFERENTIAL/PLATELET
BASOS ABS: 0 10*3/uL (ref 0.0–0.1)
Basophils Relative: 1 % (ref 0–1)
Eosinophils Absolute: 0.3 10*3/uL (ref 0.0–0.7)
Eosinophils Relative: 3 % (ref 0–5)
HCT: 34.9 % — ABNORMAL LOW (ref 36.0–46.0)
Hemoglobin: 11.1 g/dL — ABNORMAL LOW (ref 12.0–15.0)
LYMPHS ABS: 2.4 10*3/uL (ref 0.7–4.0)
LYMPHS PCT: 27 % (ref 12–46)
MCH: 28 pg (ref 26.0–34.0)
MCHC: 31.8 g/dL (ref 30.0–36.0)
MCV: 87.9 fL (ref 78.0–100.0)
Monocytes Absolute: 0.7 10*3/uL (ref 0.1–1.0)
Monocytes Relative: 8 % (ref 3–12)
NEUTROS ABS: 5.4 10*3/uL (ref 1.7–7.7)
Neutrophils Relative %: 61 % (ref 43–77)
Platelets: 263 10*3/uL (ref 150–400)
RBC: 3.97 MIL/uL (ref 3.87–5.11)
RDW: 14.2 % (ref 11.5–15.5)
WBC: 8.8 10*3/uL (ref 4.0–10.5)

## 2013-09-20 LAB — BASIC METABOLIC PANEL
BUN: 13 mg/dL (ref 6–23)
CALCIUM: 10.2 mg/dL (ref 8.4–10.5)
CO2: 28 mEq/L (ref 19–32)
CREATININE: 0.73 mg/dL (ref 0.50–1.10)
Chloride: 94 mEq/L — ABNORMAL LOW (ref 96–112)
GFR calc non Af Amer: >90 mL/min (ref >90)
Glucose, Bld: 99 mg/dL (ref 70–99)
POTASSIUM: 3.6 meq/L — AB (ref 3.7–5.3)
Sodium: 139 mEq/L (ref 137–147)

## 2013-09-20 LAB — I-STAT CG4 LACTIC ACID, ED: LACTIC ACID, VENOUS: 1.02 mmol/L (ref 0.5–2.2)

## 2013-09-20 LAB — I-STAT TROPONIN, ED: TROPONIN I, POC: 0 ng/mL (ref 0.00–0.08)

## 2013-09-20 LAB — CBG MONITORING, ED: Glucose-Capillary: 103 mg/dL — ABNORMAL HIGH (ref 70–99)

## 2013-09-20 LAB — PRO B NATRIURETIC PEPTIDE: PRO B NATRI PEPTIDE: 286.3 pg/mL — AB (ref 0–125)

## 2013-09-20 MED ORDER — ALBUTEROL SULFATE (2.5 MG/3ML) 0.083% IN NEBU
5.0000 mg | INHALATION_SOLUTION | Freq: Once | RESPIRATORY_TRACT | Status: AC
  Administered 2013-09-20: 5 mg via RESPIRATORY_TRACT
  Filled 2013-09-20: qty 6

## 2013-09-20 NOTE — ED Notes (Signed)
Xray at [facility]

## 2013-09-20 NOTE — ED Notes (Signed)
Dr. Cheri Guppy at the bedside.

## 2013-09-20 NOTE — ED Notes (Signed)
Previous RN contacted family to pick up patient. Pt given Dc papers at this time. Waiting on ride home. Pt currently on the phone.

## 2013-09-20 NOTE — ED Provider Notes (Signed)
CSN: [id number]     Arrival date & time 09/20/13  0350 History   First MD Initiated Contact with Patient 09/20/13 0354     Chief Complaint  Patient presents with  . Shortness of Breath     (Consider location/radiation/quality/duration/timing/severity/associated sxs/prior Treatment) HPI 53 yo woman with multiple chronic medical problems including sarcoidosis, pulmonary htn, dilated cardiomyopathy, chronic 02 dependence, HTN, h/o SVT and chronic left chest pain.   She called EMS tonight and asked to be transported to the hospital. She initially told paramedics she called because of weight gain. However, she tells me that she has SB which is worse in the supine position.  She denies chest pain, cough and fever. She reports compliance with all medications. She has been using Albuterol SVN tx every 4 hrs but has not had improvement. She says her legs are more edematous than is usual for her.   Past Medical History  Diagnosis Date  . GERD (gastroesophageal reflux disease)   . Hypertension   . 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 CHF (congestive heart failure)     Right side- Dr. Aundra Dubin ([place])  . Pulmonary HTN   . Anxiety   . Cardiomyopathy   . NSVT (nonsustained ventricular tachycardia)     with syncope: St Jude ICD was implanted. Device now nearing ERI. Given end stage lung disease and normalization of LV function, the device will not be replaced and tachy therapies were turned off  . Chronic chest pain     LHC (08/2008) with no angiographic coronary diease  . Allergic rhinitis   . H/O: iron deficiency anemia   . Secondary amenorrhea     irregular menses  . Psychosis     with high dose steroids  . SVT (supraventricular tachycardia)     possible runs  . Hypokalemia     recurrent  . Rotator cuff sprain   . Chronic back pain    Past Surgical History  Procedure Laterality Date  . Cardiac catheterization  08/26/2008    5% EF with normal coronary arteries and normal wall motion   . Adenosine cardiolyte  03/31/2003    no ischemia/infarct; marked global LV hypekinesis; resting EF 22%  . Cardiac catheterization  03/31/2003    globally depressed LV fxn with EF 20%; idiopathic dilated cardiomyopathy  . Defibrillator placed  03/31/2003    St. Jude single chamber (Dr. Cristopher Peru)  . Pfts      FVC 2000 (56%) FEV1 1400 (47%), ratio 0.7; insignif response to bronchodilatior; stable from 1/05 - 9/05; PFTs: Mod restriction, FVC 1.9L, FEV1 1.6L, both 53% predicted; slt response to brochodilators 04/08/2002   Family History  Problem Relation Age of Onset  . Lung cancer Father   . Cancer Father     lung cancer  . Hypertension    . Diabetes Mother     border line  . Hypertension Mother   . Heart failure Mother    History  Substance Use Topics  . Smoking status: Former Smoker -- 1.50 packs/day for 11 years    Types: Cigarettes    Quit date: 04/08/1992  . Smokeless tobacco: Never Used     Comment: 1ppd x 20 years  . Alcohol Use: Yes     Comment: remote history of alcohol abuse (quit 1994)   OB History   Grav Para Term Preterm Abortions TAB SAB Ect Mult Living  Review of Systems Ten point review of symptoms performed and is negative with the exception of symptoms noted above.     Allergies  Latex; Nitroglycerin; Ace inhibitors; Meperidine hcl; Other; Tramadol hcl; and Propoxyphene n-acetaminophen  Home Medications   Prior to Admission medications   Medication Sig Start Date End Date Taking? Authorizing Provider  acetaminophen (TYLENOL) 500 MG tablet Take 500 mg by mouth every 6 (six) hours as needed.    Historical Provider, MD  albuterol (PROAIR HFA) 108 (90 BASE) MCG/ACT inhaler Inhale 2 puffs into the lungs 4 (four) times daily. 08/21/13   Waylan Boga, NP  ARIPiprazole (ABILIFY) 2 MG tablet Take 1 tablet (2 mg total) by mouth daily. 09/03/13   Waylan Boga, NP  aspirin EC 81 MG tablet Take 1 tablet (81 mg total) by mouth daily. 08/21/13    Waylan Boga, NP  budesonide-formoterol (SYMBICORT) 160-4.5 MCG/ACT inhaler Inhale 1 puff into the lungs 2 (two) times daily.    Historical Provider, MD  carvedilol (COREG) 6.25 MG tablet Take 1 tablet (6.25 mg total) by mouth 2 (two) times daily with a meal. 08/21/13   Waylan Boga, NP  esomeprazole (NEXIUM) 40 MG capsule Take 40 mg by mouth every morning.    Historical Provider, MD  ferrous sulfate 325 (65 FE) MG tablet Take 2 tablets (650 mg total) by mouth every evening. 08/21/13   Waylan Boga, NP  furosemide (LASIX) 40 MG tablet Take 1 tablet (40 mg total) by mouth every other day. 08/21/13   Waylan Boga, NP  Iloprost 20 MCG/ML SOLN Inhale into the lungs. Ventavis-- 6 times daily - Inhalation 08/21/13   Waylan Boga, NP  PARoxetine (PAXIL) 20 MG tablet Take 1 tablet (20 mg total) by mouth daily. 08/31/13   Shuvon Rankin, NP  potassium chloride SA (K-DUR,KLOR-CON) 20 MEQ tablet Take 20-40 mEq by mouth 2 (two) times daily. 40 meq in the morning and 20 meq at night    Historical Provider, MD  sildenafil (REVATIO) 20 MG tablet Take 4 tablets (80 mg total) by mouth 3 (three) times daily. take 4  tablets every 8 hours 08/21/13   Waylan Boga, NP  sodium chloride (OCEAN) 0.65 % SOLN nasal spray Place 1 spray into both nostrils as needed for congestion.    Historical Provider, MD  traZODone (DESYREL) 100 MG tablet Take 1 tablet (100 mg total) by mouth at bedtime as needed for sleep. 08/31/13   Shuvon Rankin, NP   There were no vitals taken for this visit. Physical Exam Gen: well developed and well nourished appearing, anxious appearing Head: NCAT Eyes: PERL, EOMI Nose: no epistaixis or rhinorrhea Mouth/throat: mucosa is moist and pink Neck: supple, no stridor, no jvd Lungs: CTA B, no wheezing, rhonchi or rales CV: regular rate and rythm, good distal pulses.  Abd: soft, notender, nondistended Back: no ttp, no cva ttp Skin: warm and dry Ext: mild bilteral and symmetric edema, normal to  inspection Neuro: CN ii-xii grossly intact, no focal deficits Psyche; anxious affect,  calm and cooperative.  ED Course  Procedures (including critical care time) Labs Review  Results for orders placed during the hospital encounter of 09/20/13 (from the past 24 hour(s))  BASIC METABOLIC PANEL     Status: Abnormal   Collection Time    09/20/13  4:38 AM      Result Value Ref Range   Sodium 139  137 - 147 mEq/L   Potassium 3.6 (*) 3.7 - 5.3 mEq/L   Chloride 94 (*) 96 -  112 mEq/L   CO2 28  19 - 32 mEq/L   Glucose, Bld 99  70 - 99 mg/dL   BUN 13  6 - 23 mg/dL   Creatinine, Ser 0.73  0.50 - 1.10 mg/dL   Calcium 10.2  8.4 - 10.5 mg/dL   GFR calc non Af Amer >90  >90 mL/min   GFR calc Af Amer >90  >90 mL/min  PRO B NATRIURETIC PEPTIDE     Status: Abnormal   Collection Time    09/20/13  4:38 AM      Result Value Ref Range   Pro B Natriuretic peptide (BNP) 286.3 (*) 0 - 125 pg/mL  CBC WITH DIFFERENTIAL     Status: Abnormal   Collection Time    09/20/13  4:38 AM      Result Value Ref Range   WBC 8.8  4.0 - 10.5 K/uL   RBC 3.97  3.87 - 5.11 MIL/uL   Hemoglobin 11.1 (*) 12.0 - 15.0 g/dL   HCT 34.9 (*) 36.0 - 46.0 %   MCV 87.9  78.0 - 100.0 fL   MCH 28.0  26.0 - 34.0 pg   MCHC 31.8  30.0 - 36.0 g/dL   RDW 14.2  11.5 - 15.5 %   Platelets 263  150 - 400 K/uL   Neutrophils Relative % 61  43 - 77 %   Neutro Abs 5.4  1.7 - 7.7 K/uL   Lymphocytes Relative 27  12 - 46 %   Lymphs Abs 2.4  0.7 - 4.0 K/uL   Monocytes Relative 8  3 - 12 %   Monocytes Absolute 0.7  0.1 - 1.0 K/uL   Eosinophils Relative 3  0 - 5 %   Eosinophils Absolute 0.3  0.0 - 0.7 K/uL   Basophils Relative 1  0 - 1 %   Basophils Absolute 0.0  0.0 - 0.1 K/uL  I-STAT TROPOININ, ED     Status: None   Collection Time    09/20/13  4:47 AM      Result Value Ref Range   Troponin i, poc 0.00  0.00 - 0.08 ng/mL   Comment 3           I-STAT CG4 LACTIC ACID, ED     Status: None   Collection Time    09/20/13  4:49 AM       Result Value Ref Range   Lactic Acid, Venous 1.02  0.5 - 2.2 mmol/L  CBG MONITORING, ED     Status: Abnormal   Collection Time    09/20/13  5:31 AM      Result Value Ref Range   Glucose-Capillary 103 (*) 70 - 99 mg/dL    DG Chest Port 1 View (Final result)  Result time: 09/20/13 04:42:01    Procedure changed from [facility] Chest Portable 2 Views       Final result by Rad Results In Interface (09/20/13 04:42:01)    Narrative:   CLINICAL DATA: Shortness of breath. Chest pain.  EXAM: PORTABLE CHEST - 1 VIEW  COMPARISON: Chest radiograph Aug 21, 2013.  FINDINGS: The cardiac silhouette appears mildly enlarged, similar. Mediastinal silhouette is nonsuspicious. Diffusely coarsened pulmonary interstitium confluent in the lung apices are similar. No pleural effusions or focal consolidations. Similar tenting of the right hemidiaphragm. No pneumothorax.  Left cardiac defibrillator in situ. Multiple EKG lines overlie the patient and may obscure subtle underlying pathology. Soft tissue planes and included osseous structures are nonsuspicious.  IMPRESSION: Stable chronic  lung changes, mild cardiomegaly without convincing evidence of acute cardiopulmonary process.   Electronically Signed By: Elon Alas On: 09/20/2013 04:42        EKG: nsr, no acute ischemic changes, normal intervals, normal axis, normal qrs complex  MDM    ED work up is reassuring. The patient's CXR is unchanged from baseline. Her EKG is unremarkable. Labs reassuring. BNP only very mildly elevated.   The patient is reassured and is stable for discharge with plan to see her PCP tomorrow for recheck.    Elyn Peers, MD 09/20/13 [phone]

## 2013-09-20 NOTE — ED Notes (Signed)
Sister and son notified of pending d/c and need for ride home. Son instructed me to call his fiance. Angie contacted. Will call back.

## 2013-09-20 NOTE — ED Notes (Signed)
Given ice chips per request,updated with plan, NAD, calm, shaky, no dyspnea noted, skin W&D, hesitantly interactive.

## 2013-09-20 NOTE — ED Notes (Signed)
VS enroute BP 150/90, p 90, o2 sat 98% on 4L nasal cannula. Patient arrived with discharge papers from 09/19/13.

## 2013-09-20 NOTE — Discharge Instructions (Signed)
Cardiomyopathy Cardiomyopathy means a disease of the heart muscle. The heart muscle becomes enlarged or stiff. The heart is not able to pump enough blood or deliver enough oxygen to the body. This leads to heart failure and is the number one reason for heart transplants.  TYPES OF CARDIOMYOPATHY INCLUDE: DILATED  The most common type. The heart muscle is stretched out and weak so there is less blood pumped out.   Some causes:  Disease of the arteries of the heart (ischemia).  Heart attack with muscle scar.  Leaky or damaged valves.  After a viral illness.  Smoking.  High cholesterol.  Diabetes or overactive thyroid.  Alcohol or drug abuse.  High blood pressure.  May be reversible. HYPERTROPHIC The heart muscle grows bigger so there is less room for blood in the ventricle, and not enough blood is pumped out.   Causes include:  Mitral valve leaks.  Inherited tendency (from your family).  No explanation (idiopathic).  May be a cause of sudden death in young athletes with no symptoms. RESTRICTIVE The heart muscle becomes stiff, but not always larger. The heart has to work harder and will get weaker. Abnormal heart beats or rhythm (arrhythmia) are common.  Some causes:  Diseases in other parts of the body which may produce abnormal deposits in the heart muscle.  Probably not inherited.  A result of radiation treatment for cancer. SYMPTOMS OF ALL TYPES:  Less able to exercise or tolerate physical activity.  Palpitations.  Irregular heart beat, heart arrhythmias.  Shortness of breath, even at rest.  Chest pain.  Lightheadedness or fainting. TREATMENT  Life-style changes including reducing salt, lowering cholesterol, stop smoking.  Manage contributing causes with medications.  Medicines to help reduce the fluids in the body.  An implanted cardioverter defibrillator (ICD) to improve heart function and correct arrhythmias.  Medications to relax the blood  vessels and make it easier for the heart to pump.  Drugs that help regulate heart beat and improve heart relaxation, reducing the work of the heart.  Myomectomy for patients with hypertrophic cardiomyopathy and severe problems. This is a surgical procedure that removes a portion of the thickened muscle wall in order to improve heart output and provide symptom relief.  A heart transplant is an option in carefully applied circumstances. SEEK IMMEDIATE MEDICAL CARE IF:   You have severe chest pain, especially if the pain is crushing or pressure-like and spreads to the arms, back, neck, or jaw, or if you have sweating, feeling sick to your stomach (nausea), or shortness of breath. THIS IS AN EMERGENCY. Do not wait to see if the pain will go away. Get medical help at once. Call your local emergency services (911 in [place] DO NOT drive yourself to the hospital.  You develop severe shortness of breath.  You begin to cough up bloody sputum.  You are unable to sleep because you cannot breathe.  You gain weight due to fluid retention.  You develop painful swelling in your calf or leg.  You feel your heart racing and it does not go away or happens when you are resting. Document Released: 06/07/2004 Document Revised: 06/17/2011 Document Reviewed: 11/11/2007 [facility] Patient Information 2014 [place].   PLEASE RETURN TO THE ED FOR WORSENING SYMPTOMS OR ANY URGENT HEALTH CONCERNS.

## 2013-09-20 NOTE — ED Notes (Signed)
Patient states she was just seen in the Emergency Dept.

## 2013-09-20 NOTE — ED Notes (Signed)
Per EMS, patient called 911 for difficulty breathing.  She said she was trying to wait until her next scheduled breathing treatment, but decided to come to the ER.

## 2013-09-20 NOTE — ED Notes (Addendum)
Pt friend arrived. Pt states that she wears home O2 and does not have a way to transport home without it. Attempted to contact [facility] for oxygen. [facility] called for transport.

## 2013-09-20 NOTE — ED Notes (Signed)
CBG-[id number]. Notified RN J. Lacinda Axon

## 2013-09-20 NOTE — ED Notes (Signed)
Pt alert, passively interactive,not answering all questions, answers some questions with hesitation, NAD, calm, MAEx4 with poor effort, intermitent twitching/tremors present,no dyspnea noted. RT at [facility], neb in progress.

## 2013-09-20 NOTE — ED Notes (Signed)
Dr.Manly and this RN at [facility] for team d/c, pt on phone attempting to call for ride.

## 2013-09-23 ENCOUNTER — Ambulatory Visit (INDEPENDENT_AMBULATORY_CARE_PROVIDER_SITE_OTHER): Payer: Medicare Other | Admitting: Family Medicine

## 2013-09-23 ENCOUNTER — Encounter: Payer: Self-pay | Admitting: Family Medicine

## 2013-09-23 VITALS — BP 152/91 | HR 102 | Temp 98.1°F | Wt 160.0 lb

## 2013-09-23 DIAGNOSIS — I509 Heart failure, unspecified: Secondary | ICD-10-CM

## 2013-09-23 NOTE — Patient Instructions (Addendum)
Front desk: Please schedule Ms Mohiuddin with Dr. Maricela Bo next Friday, 6/26 at 2:30 PM for f/u of her CHF. Thanks! -- Dr Venetia Maxon  Thank you for coming in, today!  I'm glad you're feeling better. Go back to taking your Lasix every other day. Take one this afternoon. Take one potassium pill tonight, then start back two in the morning and one at night, tomorrow. Make sure you keep your appointment on Monday with the heart doctor at 3:00 PM. Make an appointment when you leave today to see Dr. Maricela Bo on the 26th.  Please feel free to call with any questions or concerns at any time, at [phone]. --Dr. Venetia Maxon

## 2013-09-23 NOTE — Assessment & Plan Note (Signed)
A: F/u from the ED for constellation of symptoms of SOB, LE edema, weight gain, with improvement s/p extra PO Lasix this week. Upcoming appt with heart failure clinic in 4 days. Physical exam improved compared to documented ED visits, and per pt report her edema is better to reflect her improved symptoms.  P: Resume every-other-day Lasix dosing; renal function was normal 3 days ago with only minor increase of Lasix dose, so no labs drawn today. Pt instructed to keep appt with heart failure clinic to discuss with them whether or not Lasix needs to be adjusted long-term. Pt also to f/u with PCP Dr. Maricela Bo next week, per her request, as he is graduating and she will need to follow up with a new PCP. Otherwise instructed pt to call if needed in the meantime.

## 2013-09-23 NOTE — Progress Notes (Signed)
Subjective:    Patient ID: Rachel [name], female    DOB: February 05, 1961, 54 y.o.   MRN: [id number]  HPI: Pt presents to clinic for f/u from the ED; she was seen on 6/14 and 6/15 for several related complaints (weight gain / LE edema, SOB, and anxiety over these symptoms) and has taken increased Lasix this week per ED MD instructions (normally takes it every other day, but has taken it daily since being seen in the ED). She has not taken Lasix yet, today. She feels her symptoms are much better and her LE edema is also improved. She, by our scale, has lost 10 pounds in the last month, and feels this is because she has "done better with her diet and medicines." She does have some occasional night-sweats when she wakes up short of breath, but this is infrequent and has been better since taking extra Lasix this week, as above.  Note pt is on chronic O2 for end-stage sarcoid and CHF with hypoxia at rest and has numerous other medical issues including anxiety, HTN, GERD, depression / anxiety with hx of some psychotic features, and has had a history of SVT; in the ED, her CXR and EKG were unchanged from baseline and her BNP was only mildly elevated.  Review of Systems: As above. Denies frank chest pain, active / new or different SOB, abdominal pain, ext pain, N/V, fever / chills, or urinary symptoms.     Objective:   Physical Exam BP 152/91  Pulse 102  Temp(Src) 98.1 F (36.7 C) (Oral)  Wt 160 lb (72.576 kg)  SpO2 95% Gen: well but chronically-ill appearing adult female using rolling walker for assistance ambulating, on chronic O2 (4L) Cardio: RRR, no definite murmur appreciated, no chest wall tenderness Pulm: CTAB, no wheezes but with protracted expiratory phase noted Abd: soft, nontender, BS+ Ext: mild symmetric LE edema at the ankles noted; TED hose in place  otherwise warm, well-perfused with distal pulses intact /symmetric    Assessment & Plan:

## 2013-09-24 ENCOUNTER — Other Ambulatory Visit: Payer: Self-pay | Admitting: Cardiology

## 2013-09-26 LAB — CULTURE, BLOOD (ROUTINE X 2)
CULTURE: NO GROWTH
Culture: NO GROWTH

## 2013-09-27 ENCOUNTER — Encounter (HOSPITAL_COMMUNITY): Payer: Self-pay

## 2013-09-27 ENCOUNTER — Ambulatory Visit (HOSPITAL_COMMUNITY)
Admission: RE | Admit: 2013-09-27 | Discharge: 2013-09-27 | Disposition: A | Discharge status: Home / Self Care | Payer: Medicare Other | Source: Ambulatory Visit | Attending: Internal Medicine | Admitting: Internal Medicine

## 2013-09-27 VITALS — BP 120/66 | HR 97 | Wt 162.0 lb

## 2013-09-27 DIAGNOSIS — D869 Sarcoidosis, unspecified: Secondary | ICD-10-CM

## 2013-09-27 DIAGNOSIS — I2789 Other specified pulmonary heart diseases: Secondary | ICD-10-CM | POA: Diagnosis not present

## 2013-09-27 DIAGNOSIS — J99 Respiratory disorders in diseases classified elsewhere: Secondary | ICD-10-CM | POA: Diagnosis not present

## 2013-09-27 NOTE — Progress Notes (Signed)
Patient ID: Rachel [name], female   DOB: May 14, 1960, 53 y.o.   MRN: [id number]  PCP: Dr. 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 She is now on Revatio 80 mg tid and and inhaled Iloprost.  She has been evaluated for heart/lung transplant at [facility] as an inpatient but needed to raise around $10,000 to continue evaluation and has decided against this for now. She has been seeing Dr. Lamonte Sakai for her sarcoid.     Admitted in 4/15 with acute bronchitis and possibly some CHF.  She was given antibiotics, IV Lasix, and prednisone for possible component of sarcoid flare.  Echo 4/15 showed EF 60%, normal RV size and systolic function, unable to estimate PA pressure.    Here for f/u: Overall breathing is better. Can do ADLs without too much problem but with more exertion gets SOB. No syncope or presyncope. No edema. Off prednisone.   She feels better overall though her legs still feel week.  She finishes prednisone today.  She is not getting out much but can walk around the house without dyspnea. Using Iloprost 4-6x per day. Says it is hard to use it 6x/day - Dr. Aundra Dubin was checking into Tyvaso.  Taking Revatio 80 tid. Continues on home O2. Now 3-4L. Goes to 5-6L if needed. Gets charley horse in her left quad often.   6 minute (4/15): 123 m.      Last RHC: RHC (8/12): mean RA 2, PA 51/24, mean PA 35, mean PCWP 6, CI 3, PVR 5.1 WU.   Labs (11/10): BNP 91, creatinine 1.0, K 4  Labs (2/11): K 4.6, creatinine 0.86  Labs (3/11): BNP 58, K 4.2, creatinine 0.8  Labs (10/11): K 4.5, creatinine 0.8, BNP 76  Labs (7/13): LDL 102, K 4.1, creatinine 0.85 Labs (4/15): K 4.2, creatinine 0.76, BNP 467  ECG: NSR, nonspecific T wave changes  Allergies (verified):  1) ! * Steroids High Dose  2) Demerol  3) Ultram  4) Darvocet-N 100  5) Doxycycline   Past Medical History:  1. Sarcoidosis: Stage IV pulmonary sarcoid on home O2.   Has been quiescent recently.  She has been referred to [facility] for lung transplant evaluation.  2. Cardiomyopathy: Cardiac MRI in 2004 with no evidence for infiltrative disease but EF 40%. Echo (9/10): EF 60%, severely dilated RV with moderately decreased systolic function, moderate RAE, PASP 83 mmHg.  Echo (1/12): EF 40%, grade I diastolic dysfunction, moderate biatrial enlargement, moderate RV dilation with normal RV systolic function, peak RV-RA gradient 34 mmHg.  Echo (8/13): EF 55-60%, mildly dilated RV with mild systolic dysfunction, PA systolic pressure 45 mmHg.  3. Pulmonary hypertension with RV failure: Moderate to severe, likely secondary to sarcoidosis and parenchymal lung disease/hypoxic pulmonary vasoconstriction. RHC 5/10 showed PA 67/30 (mean 46) with mean PCWP 6 mmHg. Patient started on Revatio in 5/10 without any change in oxygen saturation. 6 minute walk 7/10: 61 m. Echo (9/10): EF 60%, severely dilated RV with moderately decreased systolic function, moderate RAE, PASP 83 mmHg. RHC (9/10) with mean RA 15, PA 74/36, CI 2.8. Repeat RHC after diuresis with mean RA 3, PA 60/22, mean PCWP 4. Echo (1/11): EF 08-67%, grade I diastolic dysfunction, moderately dilated RV, mild to moderate RV dysfunction, moderate to severe TR, PASP 58 mmHg. 6 minute walk (2/11): 122 m. 6 min walk (6/11): 161.5 m. RHC (6/11): mean RA 11, RV 64/15, PA 63/28 mean  43, mean PCWP 14, CI 2.4 thermodilution and 3.2 Fick, PVR 5.3 WU Fick and 7.25 WU thermodilution. Iloprost begun. 6 min walk (10/11): 158 m. 6 min walk (1/12) 273 m.  6 min walk (7/12) 248 m.  RHC (8/12): mean RA 2, PA 51/24, mean PA 35, mean PCWP 6, CI 3, PVR 5.1 WU.  PASP 45 mmHg on 8/13 echo.  6 minute walk (4/14): 223 m. 6 minute walk (9/14) 299 m. 6 min walk (4/15) 123 m 4. 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 Device is now nearing ERI. Given end stage lung disease and normalization of LV function, the device will not be replaced and tachy therapies  were turned off.  5. GERD  6. Chronic chest pain. LHC (5/10) with no angiographic coronary disease.  7. Allergic rhinitis,  8. h/o iron deficiency anemia.  9. H/o secondary amenorrhea/irregular menses,  10. Psychosis with high dose steroids.  11. SVT  12. Recurrent Hypokalemia  13. h/o Rotator cuff sprain  14. 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   ROS: All systems reviewed and negative except as per HPI.    Current Outpatient Prescriptions  Medication Sig Dispense Refill  . acetaminophen (TYLENOL) 500 MG tablet Take 500 mg by mouth every 6 (six) hours as needed.      Marland Kitchen albuterol (PROAIR HFA) 108 (90 BASE) MCG/ACT inhaler Inhale 2 puffs into the lungs 4 (four) times daily.  8.5 g  11  . ARIPiprazole (ABILIFY) 2 MG tablet Take 1 tablet (2 mg total) by mouth daily.  30 tablet  1  . aspirin EC 81 MG tablet Take 1 tablet (81 mg total) by mouth daily.  1 tablet  0  . budesonide-formoterol (SYMBICORT) 160-4.5 MCG/ACT inhaler Inhale 1 puff into the lungs 2 (two) times daily.      . carvedilol (COREG) 6.25 MG tablet Take 1 tablet (6.25 mg total) by mouth 2 (two) times daily with a meal.  60 tablet  0  . esomeprazole (NEXIUM) 40 MG capsule Take 40 mg by mouth every morning.      . furosemide (LASIX) 40 MG tablet Take 1 tablet (40 mg total) by mouth every other day.  45 tablet  3  . Iloprost 20 MCG/ML SOLN Inhale into the lungs. Ventavis-- 6 times daily - Inhalation  45 mL  2  . PARoxetine (PAXIL) 20 MG tablet Take 1 tablet (20 mg total) by mouth daily.  30 tablet  1  . potassium  chloride SA (K-DUR,KLOR-CON) 20 MEQ tablet Take 20-40 mEq by mouth 2 (two) times daily. 40 meq in the morning and 20 meq at night      . sildenafil (REVATIO) 20 MG tablet Take 4 tablets (80 mg total) by mouth 3 (three) times daily. take 4  tablets every 8 hours  10 tablet  0  . sodium chloride (OCEAN) 0.65 % SOLN nasal spray Place 1 spray into both nostrils as needed for congestion.      . traZODone (DESYREL) 100 MG tablet Take 1 tablet (100 mg total) by mouth at bedtime  as needed for sleep.  30 tablet  1   No current facility-administered medications for this encounter.    BP 120/66  Pulse 97  Wt 162 lb (73.483 kg)  SpO2 91% General: Well-developed,well-nourished,in no acute distress; O2 applied via nasal cannula  Neck: Neck supple, JVP 5-6 cm. No masses, thyromegaly or abnormal cervical nodes.  Lungs: Clear bilaterally to auscultation and percussion.  Heart: Non-displaced PMI, chest non-tender; regular rate and rhythm, S1, S2 without rubs or gallops. 1/6 systolic murmur LLSB. Loud P2. Carotid upstroke normal, no bruit. Pedals normal pulses. No ankle edema bilaterally. Abdomen: Bowel sounds positive; abdomen soft and non-tender without masses, organomegaly, or hernias noted. No hepatosplenomegaly.  Extremities: No clubbing or cyanosis.  Neurologic: Alert and oriented x 3.  Psych: Normal affect.  Assessment/Plan:  PULMONARY HYPERTENSION  She has sarcoidosis but has pulmonary hypertension out of proportion to sarcoidosis (suspect WHO group 1 component).  She is currently on Iloprost and Revatio.  Last right heart cath in 8/12 showed improved PA pressure and mildly improved cardiac index on Iloprost. PA pressure and PVR was still high, however. Last echo in 4/15 showed normal LV EF and normal RV size and systolic function.  Unable to estimate PA systolic pressure.   Volume status looks ok at this point.  - Overall I feel she is doing quite well from a [place] standpoint. Stable NYHA III symptoms  which are likely multifactorial and no RV strain on echo.  - She is having a lot of trouble with 6 times a day Iloprost administration though it does seem to help her symptoms. We will look into changing her to Tyvaso again. - She did not take her meds this am so will schedule 6MW for next week. If 6MW poor can consider repeating RHC.  - Followup in 2 months.  Glori Bickers MD 09/27/2013 3:34 PM

## 2013-09-30 ENCOUNTER — Encounter (HOSPITAL_COMMUNITY): Payer: Self-pay

## 2013-10-01 ENCOUNTER — Telehealth: Payer: Self-pay | Admitting: *Deleted

## 2013-10-01 DIAGNOSIS — D869 Sarcoidosis, unspecified: Secondary | ICD-10-CM

## 2013-10-01 MED ORDER — ALBUTEROL SULFATE HFA 108 (90 BASE) MCG/ACT IN AERS: 2.0000 | INHALATION_SPRAY | Freq: Four times a day (QID) | RESPIRATORY_TRACT | Status: DC

## 2013-10-01 NOTE — Telephone Encounter (Signed)
Patient here today in the office.  Had AVS that states she had an appt today with Dr. Maricela Bo at 2:30 pm.  Unfortunately, appt was not scheduled in computer.  Per Asha--Dr. Maricela Bo does not have any appts available and is unable to see patient today.  Per Williamson--Ok to have patient wait until next week.  Dr. Lonny Prude has been assigned as patient's new PCP, but patient requesting Dr. Venetia Maxon since she recently saw him.  Will change PCP to Dr. Venetia Maxon in [place].  Appt scheduled with Dr. Venetia Maxon for 10/06/13 @ 2:30 pm for CHF f/u.  Patient has an appt with her cardiologist on Monday and will check with him to see if she needs to keep appt next week with Dr. Venetia Maxon.  Will call to cancel appt if not needed.  Patient needs assistance with transportation for next week's appt.  Spoke with [name], CSW and will use [employer] vou[employer] if family members unavailable to bring patient to next week's visit.  [employer] will assist patient with transporting her oxygen tanks and walker to our office.  Patient requesting refill of Proair HFA inhaler.  States one inhaler is only lasting 25 days and requesting additional amount to cover 30 days.  Med refilled to [facility] for 18 gm inhaler--inhale 2 puffs into the lungs QID.   Burna Forts, BSN, RN-BC

## 2013-10-04 ENCOUNTER — Ambulatory Visit (HOSPITAL_COMMUNITY)
Admission: RE | Admit: 2013-10-04 | Discharge: 2013-10-04 | Disposition: A | Discharge status: Home / Self Care | Payer: Medicare Other | Source: Ambulatory Visit | Attending: Internal Medicine | Admitting: Internal Medicine

## 2013-10-04 DIAGNOSIS — I509 Heart failure, unspecified: Secondary | ICD-10-CM | POA: Insufficient documentation

## 2013-10-04 LAB — BASIC METABOLIC PANEL
BUN: 12 mg/dL (ref 6–23)
CHLORIDE: 102 meq/L (ref 96–112)
CO2: 29 meq/L (ref 19–32)
Calcium: 9.7 mg/dL (ref 8.4–10.5)
Creatinine, Ser: 0.71 mg/dL (ref 0.50–1.10)
GFR calc non Af Amer: >90 mL/min (ref >90)
Glucose, Bld: 102 mg/dL — ABNORMAL HIGH (ref 70–99)
POTASSIUM: 3.5 meq/L — AB (ref 3.7–5.3)
Sodium: 141 mEq/L (ref 137–147)

## 2013-10-04 NOTE — Telephone Encounter (Signed)
Patient calling back after seeing Dr. Aundra Dubin today.  States she was seen for "my heart" and will keep appt with PCP on 10/06/13 to have cough/mucus evaluated. Burna Forts, BSN, RN-BC

## 2013-10-04 NOTE — Progress Notes (Signed)
6 min walk test completed, pt ambulated 121 meters (400 ft) on 4 L oxygen via [place], o2 sats 87-91%, about the same as last time per Dr Haroldine Laws

## 2013-10-04 NOTE — Addendum Note (Signed)
Encounter addended by: Scarlette Calico, RN on: 10/04/2013  2:15 PM<BR>     Documentation filed: Notes Section

## 2013-10-06 ENCOUNTER — Ambulatory Visit (INDEPENDENT_AMBULATORY_CARE_PROVIDER_SITE_OTHER): Payer: Medicare Other | Admitting: Family Medicine

## 2013-10-06 ENCOUNTER — Encounter: Payer: Self-pay | Admitting: Family Medicine

## 2013-10-06 VITALS — BP 156/80 | HR 96 | Temp 98.1°F | Wt 161.0 lb

## 2013-10-06 DIAGNOSIS — J988 Other specified respiratory disorders: Secondary | ICD-10-CM

## 2013-10-06 DIAGNOSIS — Z23 Encounter for immunization: Secondary | ICD-10-CM

## 2013-10-06 MED ORDER — AZITHROMYCIN 250 MG PO TABS: ORAL_TABLET | ORAL | Status: DC

## 2013-10-06 MED ORDER — GUAIFENESIN ER 600 MG PO TB12: 600.0000 mg | ORAL_TABLET | Freq: Two times a day (BID) | ORAL | Status: DC

## 2013-10-06 NOTE — Patient Instructions (Addendum)
Thank you for coming in, today!  I want you to take an antibiotic, azithromycin for five days. Take two pills the first day, then one pill a day for four more days. You can take guaifenesin (Mucinex) twice a day (once in the morning and once a night). You can take Tylenol as needed for fever or pain.  Come back to see me as you need. If your symptoms get worse instead of better, come back to see me sooner rather than later. Please feel free to call with any questions or concerns at any time, at [phone]. --Dr. Venetia Maxon

## 2013-10-06 NOTE — Progress Notes (Signed)
   Subjective:    Patient ID: Rachel [name], female    DOB: 09-04-1960, 53 y.o.   MRN: [id number]  HPI: Pt presents to clinic for follow up with complaint of runny nose, thick mucous and productive cough with a yellowish-brownish tint. These symptoms have been present for a couple of weeks; she is a little unclear about everything since she has been "back and forth from the ED and doctors offices so much, lately." She denies fevers but does feel "warm" occasionally. She has a little chest discomfort from the cough. She hasn't taken any medications over the counter or specifically for these symptoms since she takes so many other things and doesn't want to run the risk of interactions. She is due for a pneumonia shot.  She saw Dr. Haroldine Laws recently and is stable from a CHF / pulmonary HTN / sarcoid standpoint. She is on 4L by [place] constantly and occasionally goes up to 6L. She takes Iloprost and Revatio, as well as Coreg, Lasix, and potassium related to these problems.  Review of Systems: As above.     Objective:   Physical Exam BP 156/80  Pulse 96  Temp(Src) 98.1 F (36.7 C) (Oral)  Wt 161 lb (73.029 kg)  SpO2 83% Gen: elderly female with portable O2, in NAD, speaks in full sentences HEENT: [place]/AT, EOMI, PERRLA  TM's clear, nasal and pharyngeal mucosae very mildly red Cardio: soft blowing systolic murmur, RRR otherwise Pulm: generally good bilateral air movement  Very mild nonfocal wheezes at end expiration, minimal bibasilar crackles  Some rattling upper airway congestion noted with cough Ext: TED stockings in place, minimal pitting edema present     Assessment & Plan:  53yo female with significant CHF / pulm HTN / sarcoid with signs / symptoms suggestive of respiratory infection - possible viral URI now with superimposed bronchitis; doubt frank pneumonia at this point - congestion and cough is major concern for pt  Plan: Azithromycin 500 mg day one then 250 mg days two through 5 - Rx  for guaifenesin 600 mg BID for congestion; Tylenol and supportive care otherwise - otherwise continue chronic medications for cardiac / pulmonary issues - f/u with cardiology as scheduled - f/u as needed otherwise  Emmaline Kluver, MD PGY-3, [facility] 10/06/2013, 7:57 PM

## 2013-10-11 ENCOUNTER — Telehealth: Payer: Self-pay | Admitting: Emergency Medicine

## 2013-10-11 NOTE — Telephone Encounter (Signed)
Spoke with [name] at advanced and gave verbal order ok per RB Nothing further needed

## 2013-10-11 NOTE — Telephone Encounter (Signed)
Yes this is OK with me 

## 2013-10-11 NOTE — Telephone Encounter (Signed)
Please advise RB if okay to give VO? thanks

## 2013-10-12 ENCOUNTER — Telehealth: Payer: Self-pay | Admitting: *Deleted

## 2013-10-12 NOTE — Telephone Encounter (Signed)
[name], Counselor with [facility] requested that PCP give him a call regarding pt medication management.  Please call [phone].  Derl Barrow, RN

## 2013-10-13 NOTE — Telephone Encounter (Signed)
Spoke with Rachel [name]; he voiced that pt has had issues with "trust" and has expressed to him that she would prefer seeing family medicine to manage / adjust / monitor her psychotropic medications rather than seeing psychiatrists. However, he did voice that she has started taking Abilify, which she had not previously been doing, and is still attending therapy as recommended. Rachel [name] wanted to discuss with me the possibility of Rachel [name] having her care focused with me as her PCP vs seeing specialists and stated that he recommended she see a specialist due to the extent of her history (severe symptoms requiring multiple medications, different hospitalizations, etc), to which I completely agree. My recommendation to her would be to continue seeing specialist behavioral health providers as they are more experienced and capable than I about the proper monitoring and adjusting of her psychotropic medications and better able to fully assess and treat her psychiatric-specific issues. Rachel [name] stated that this was in line with what he has counseled the patient about, and that he will continue to emphasize to Rachel [name] the importance of regular follow-up and appropriate specialist care. He will see the patient in a few days and relay our conversation to her as well as urge her to follow up with me to discuss things further, if she would like. I provided him with a couple of area psychiatrist practice phone numbers and will discuss these and other resources with Rachel [name] as needed, the next time I see her. Rachel [name] and I both expressed our appreciation for the assistance of the other in the care of Rachel [name]. --CMS

## 2013-10-18 ENCOUNTER — Telehealth (HOSPITAL_COMMUNITY): Payer: Self-pay | Admitting: *Deleted

## 2013-10-18 MED ORDER — TREPROSTINIL 0.6 MG/ML IN SOLN: 18.0000 ug | Freq: Four times a day (QID) | RESPIRATORY_TRACT | Status: AC

## 2013-10-18 NOTE — Telephone Encounter (Signed)
[facility] has continue to work on getting pt's Tyvaso approved, pfts and chest ct were faxed to prove sarcoid is stable and [facility] was able to get Tyvaso approved through [place] PAP program so she will get the medication for free, verbal prescription given to [facility]

## 2013-10-22 ENCOUNTER — Telehealth: Payer: Self-pay | Admitting: Cardiology

## 2013-10-22 NOTE — Telephone Encounter (Signed)
New message     Did we get their fax from last week?

## 2013-10-22 NOTE — Telephone Encounter (Signed)
Spoke w/Jennifer at [employer] [facility].  States she faxed a pt assist form on 7/14.  Gave her fax number to CHF clinic since Adline Potter has spoken with them in past. She will fax form to them that pt will have to complete.

## 2013-10-24 ENCOUNTER — Encounter (HOSPITAL_COMMUNITY): Payer: Self-pay | Admitting: Emergency Medicine

## 2013-10-24 ENCOUNTER — Inpatient Hospital Stay (HOSPITAL_COMMUNITY)
Admission: EM | Admit: 2013-10-24 | Discharge: 2013-10-27 | DRG: 197 | Disposition: A | Discharge status: Home / Self Care | Payer: Medicare Other | Attending: Family Medicine | Admitting: Family Medicine

## 2013-10-24 ENCOUNTER — Emergency Department (HOSPITAL_COMMUNITY): Payer: Medicare Other

## 2013-10-24 DIAGNOSIS — Z87891 Personal history of nicotine dependence: Secondary | ICD-10-CM

## 2013-10-24 DIAGNOSIS — F323 Major depressive disorder, single episode, severe with psychotic features: Secondary | ICD-10-CM | POA: Diagnosis present

## 2013-10-24 DIAGNOSIS — Z8249 Family history of ischemic heart disease and other diseases of the circulatory system: Secondary | ICD-10-CM

## 2013-10-24 DIAGNOSIS — I498 Other specified cardiac arrhythmias: Secondary | ICD-10-CM | POA: Diagnosis present

## 2013-10-24 DIAGNOSIS — J99 Respiratory disorders in diseases classified elsewhere: Secondary | ICD-10-CM | POA: Diagnosis present

## 2013-10-24 DIAGNOSIS — Z886 Allergy status to analgesic agent status: Secondary | ICD-10-CM | POA: Diagnosis not present

## 2013-10-24 DIAGNOSIS — D869 Sarcoidosis, unspecified: Secondary | ICD-10-CM | POA: Diagnosis present

## 2013-10-24 DIAGNOSIS — Z888 Allergy status to other drugs, medicaments and biological substances status: Secondary | ICD-10-CM

## 2013-10-24 DIAGNOSIS — I2789 Other specified pulmonary heart diseases: Secondary | ICD-10-CM | POA: Diagnosis present

## 2013-10-24 DIAGNOSIS — I428 Other cardiomyopathies: Secondary | ICD-10-CM | POA: Diagnosis present

## 2013-10-24 DIAGNOSIS — Z9981 Dependence on supplemental oxygen: Secondary | ICD-10-CM | POA: Diagnosis not present

## 2013-10-24 DIAGNOSIS — Z95810 Presence of automatic (implantable) cardiac defibrillator: Secondary | ICD-10-CM

## 2013-10-24 DIAGNOSIS — I509 Heart failure, unspecified: Secondary | ICD-10-CM | POA: Diagnosis present

## 2013-10-24 DIAGNOSIS — E663 Overweight: Secondary | ICD-10-CM

## 2013-10-24 DIAGNOSIS — R0609 Other forms of dyspnea: Secondary | ICD-10-CM

## 2013-10-24 DIAGNOSIS — R292 Abnormal reflex: Secondary | ICD-10-CM

## 2013-10-24 DIAGNOSIS — R079 Chest pain, unspecified: Secondary | ICD-10-CM

## 2013-10-24 DIAGNOSIS — R0602 Shortness of breath: Secondary | ICD-10-CM | POA: Diagnosis present

## 2013-10-24 DIAGNOSIS — I10 Essential (primary) hypertension: Secondary | ICD-10-CM | POA: Diagnosis present

## 2013-10-24 DIAGNOSIS — G8929 Other chronic pain: Secondary | ICD-10-CM | POA: Diagnosis present

## 2013-10-24 DIAGNOSIS — Z91040 Latex allergy status: Secondary | ICD-10-CM

## 2013-10-24 DIAGNOSIS — R259 Unspecified abnormal involuntary movements: Secondary | ICD-10-CM | POA: Diagnosis present

## 2013-10-24 DIAGNOSIS — I471 Supraventricular tachycardia: Secondary | ICD-10-CM

## 2013-10-24 DIAGNOSIS — M79609 Pain in unspecified limb: Secondary | ICD-10-CM | POA: Diagnosis not present

## 2013-10-24 DIAGNOSIS — J8410 Pulmonary fibrosis, unspecified: Secondary | ICD-10-CM | POA: Diagnosis present

## 2013-10-24 DIAGNOSIS — K219 Gastro-esophageal reflux disease without esophagitis: Secondary | ICD-10-CM | POA: Diagnosis present

## 2013-10-24 DIAGNOSIS — M48061 Spinal stenosis, lumbar region without neurogenic claudication: Secondary | ICD-10-CM

## 2013-10-24 DIAGNOSIS — M549 Dorsalgia, unspecified: Secondary | ICD-10-CM | POA: Diagnosis present

## 2013-10-24 DIAGNOSIS — Z7982 Long term (current) use of aspirin: Secondary | ICD-10-CM

## 2013-10-24 DIAGNOSIS — I5030 Unspecified diastolic (congestive) heart failure: Secondary | ICD-10-CM | POA: Diagnosis present

## 2013-10-24 DIAGNOSIS — R0989 Other specified symptoms and signs involving the circulatory and respiratory systems: Secondary | ICD-10-CM

## 2013-10-24 DIAGNOSIS — R0902 Hypoxemia: Secondary | ICD-10-CM | POA: Diagnosis present

## 2013-10-24 DIAGNOSIS — F411 Generalized anxiety disorder: Secondary | ICD-10-CM | POA: Diagnosis present

## 2013-10-24 DIAGNOSIS — R0600 Dyspnea, unspecified: Secondary | ICD-10-CM

## 2013-10-24 DIAGNOSIS — M79605 Pain in left leg: Secondary | ICD-10-CM

## 2013-10-24 DIAGNOSIS — Z801 Family history of malignant neoplasm of trachea, bronchus and lung: Secondary | ICD-10-CM | POA: Diagnosis not present

## 2013-10-24 LAB — BASIC METABOLIC PANEL
ANION GAP: 15 (ref 5–15)
BUN: 14 mg/dL (ref 6–23)
CHLORIDE: 98 meq/L (ref 96–112)
CO2: 30 mEq/L (ref 19–32)
CREATININE: 0.65 mg/dL (ref 0.50–1.10)
Calcium: 9.1 mg/dL (ref 8.4–10.5)
GFR calc Af Amer: >90 mL/min (ref >90)
GFR calc non Af Amer: >90 mL/min (ref >90)
GLUCOSE: 87 mg/dL (ref 70–99)
Potassium: 4.2 mEq/L (ref 3.7–5.3)
Sodium: 143 mEq/L (ref 137–147)

## 2013-10-24 LAB — CBC WITH DIFFERENTIAL/PLATELET
BASOS ABS: 0 10*3/uL (ref 0.0–0.1)
Basophils Relative: 1 % (ref 0–1)
Eosinophils Absolute: 0.4 10*3/uL (ref 0.0–0.7)
Eosinophils Relative: 4 % (ref 0–5)
HEMATOCRIT: 34.8 % — AB (ref 36.0–46.0)
Hemoglobin: 10.8 g/dL — ABNORMAL LOW (ref 12.0–15.0)
Lymphocytes Relative: 16 % (ref 12–46)
Lymphs Abs: 1.4 10*3/uL (ref 0.7–4.0)
MCH: 28.3 pg (ref 26.0–34.0)
MCHC: 31 g/dL (ref 30.0–36.0)
MCV: 91.3 fL (ref 78.0–100.0)
MONOS PCT: 3 % (ref 3–12)
Monocytes Absolute: 0.3 10*3/uL (ref 0.1–1.0)
NEUTROS ABS: 6.7 10*3/uL (ref 1.7–7.7)
Neutrophils Relative %: 76 % (ref 43–77)
Platelets: 277 10*3/uL (ref 150–400)
RBC: 3.81 MIL/uL — AB (ref 3.87–5.11)
RDW: 14.7 % (ref 11.5–15.5)
WBC: 8.7 10*3/uL (ref 4.0–10.5)

## 2013-10-24 LAB — I-STAT TROPONIN, ED
Troponin i, poc: 0 ng/mL (ref 0.00–0.08)
Troponin i, poc: 0 ng/mL (ref 0.00–0.08)

## 2013-10-24 LAB — TROPONIN I: Troponin I: <0.3 ng/mL

## 2013-10-24 LAB — TSH: TSH: 0.977 u[IU]/mL (ref 0.350–4.500)

## 2013-10-24 LAB — PRO B NATRIURETIC PEPTIDE: Pro B Natriuretic peptide (BNP): 661.8 pg/mL — ABNORMAL HIGH (ref 0–125)

## 2013-10-24 MED ORDER — ALBUTEROL SULFATE (2.5 MG/3ML) 0.083% IN NEBU: 2.5000 mg | INHALATION_SOLUTION | RESPIRATORY_TRACT | Status: DC | PRN

## 2013-10-24 MED ORDER — SODIUM CHLORIDE 0.9 % IJ SOLN
3.0000 mL | Freq: Two times a day (BID) | INTRAMUSCULAR | Status: DC
  Administered 2013-10-24 – 2013-10-27 (×5): 3 mL via INTRAVENOUS

## 2013-10-24 MED ORDER — TREPROSTINIL 0.6 MG/ML IN SOLN: 18.0000 ug | Freq: Four times a day (QID) | RESPIRATORY_TRACT | Status: DC

## 2013-10-24 MED ORDER — SILDENAFIL CITRATE 20 MG PO TABS
80.0000 mg | ORAL_TABLET | Freq: Three times a day (TID) | ORAL | Status: DC
  Administered 2013-10-24 – 2013-10-27 (×9): 80 mg via ORAL
  Filled 2013-10-24 (×10): qty 4

## 2013-10-24 MED ORDER — IPRATROPIUM-ALBUTEROL 0.5-2.5 (3) MG/3ML IN SOLN
3.0000 mL | Freq: Once | RESPIRATORY_TRACT | Status: AC
  Administered 2013-10-24: 17:00:00 via RESPIRATORY_TRACT
  Filled 2013-10-24: qty 3

## 2013-10-24 MED ORDER — CARVEDILOL 6.25 MG PO TABS
6.2500 mg | ORAL_TABLET | Freq: Two times a day (BID) | ORAL | Status: DC
Start: 2013-10-25 — End: 2013-10-27
  Administered 2013-10-25 – 2013-10-27 (×6): 6.25 mg via ORAL
  Filled 2013-10-24 (×7): qty 1

## 2013-10-24 MED ORDER — PREDNISONE 10 MG PO TABS: 60.0000 mg | ORAL_TABLET | Freq: Once | ORAL | Status: DC

## 2013-10-24 MED ORDER — SODIUM CHLORIDE 0.9 % IJ SOLN: 3.0000 mL | INTRAMUSCULAR | Status: DC | PRN

## 2013-10-24 MED ORDER — IPRATROPIUM-ALBUTEROL 0.5-2.5 (3) MG/3ML IN SOLN
3.0000 mL | Freq: Once | RESPIRATORY_TRACT | Status: AC
  Administered 2013-10-24: 3 mL via RESPIRATORY_TRACT
  Filled 2013-10-24: qty 3

## 2013-10-24 MED ORDER — ASPIRIN 325 MG PO TABS
325.0000 mg | ORAL_TABLET | Freq: Once | ORAL | Status: AC
  Administered 2013-10-24: 325 mg via ORAL
  Filled 2013-10-24: qty 1

## 2013-10-24 MED ORDER — IPRATROPIUM-ALBUTEROL 0.5-2.5 (3) MG/3ML IN SOLN
3.0000 mL | RESPIRATORY_TRACT | Status: DC
  Administered 2013-10-24 – 2013-10-25 (×7): 3 mL via RESPIRATORY_TRACT
  Filled 2013-10-24 (×6): qty 3

## 2013-10-24 MED ORDER — PAROXETINE HCL 20 MG PO TABS
20.0000 mg | ORAL_TABLET | Freq: Every day | ORAL | Status: DC
  Administered 2013-10-25 – 2013-10-27 (×3): 20 mg via ORAL
  Filled 2013-10-24 (×5): qty 1

## 2013-10-24 MED ORDER — PREDNISONE 10 MG PO TABS
10.0000 mg | ORAL_TABLET | Freq: Every day | ORAL | Status: DC
  Administered 2013-10-25 – 2013-10-27 (×3): 10 mg via ORAL
  Filled 2013-10-24 (×4): qty 1

## 2013-10-24 MED ORDER — SODIUM CHLORIDE 0.9 % IV SOLN: 250.0000 mL | INTRAVENOUS | Status: DC | PRN

## 2013-10-24 MED ORDER — TREPROSTINIL 0.6 MG/ML IN SOLN
18.0000 ug | Freq: Four times a day (QID) | RESPIRATORY_TRACT | Status: DC
  Filled 2013-10-24 (×34): qty 0.03

## 2013-10-24 MED ORDER — SODIUM CHLORIDE 0.9 % IJ SOLN
3.0000 mL | Freq: Two times a day (BID) | INTRAMUSCULAR | Status: DC
  Administered 2013-10-26 – 2013-10-27 (×3): 3 mL via INTRAVENOUS

## 2013-10-24 MED ORDER — HEPARIN SODIUM (PORCINE) 5000 UNIT/ML IJ SOLN
5000.0000 [IU] | Freq: Three times a day (TID) | INTRAMUSCULAR | Status: DC
  Administered 2013-10-24 – 2013-10-27 (×9): 5000 [IU] via SUBCUTANEOUS
  Filled 2013-10-24 (×10): qty 1

## 2013-10-24 MED ORDER — ILOPROST 20 MCG/ML IN SOLN
20.0000 ug | RESPIRATORY_TRACT | Status: DC
  Administered 2013-10-24 – 2013-10-27 (×14): 20 ug via RESPIRATORY_TRACT
  Filled 2013-10-24: qty 1

## 2013-10-24 MED ORDER — ACETAMINOPHEN 325 MG PO TABS
650.0000 mg | ORAL_TABLET | Freq: Once | ORAL | Status: AC
  Administered 2013-10-24: 650 mg via ORAL
  Filled 2013-10-24: qty 2

## 2013-10-24 MED ORDER — ALBUTEROL SULFATE HFA 108 (90 BASE) MCG/ACT IN AERS: 2.0000 | INHALATION_SPRAY | RESPIRATORY_TRACT | Status: DC

## 2013-10-24 MED ORDER — ASPIRIN EC 81 MG PO TBEC
81.0000 mg | DELAYED_RELEASE_TABLET | Freq: Every day | ORAL | Status: DC
  Administered 2013-10-25 – 2013-10-27 (×3): 81 mg via ORAL
  Filled 2013-10-24 (×4): qty 1

## 2013-10-24 MED ORDER — ARIPIPRAZOLE 2 MG PO TABS
2.0000 mg | ORAL_TABLET | Freq: Every day | ORAL | Status: DC
  Administered 2013-10-24 – 2013-10-26 (×3): 2 mg via ORAL
  Filled 2013-10-24 (×4): qty 1

## 2013-10-24 MED ORDER — PANTOPRAZOLE SODIUM 40 MG PO TBEC
80.0000 mg | DELAYED_RELEASE_TABLET | Freq: Every day | ORAL | Status: DC
Start: 2013-10-25 — End: 2013-10-27
  Administered 2013-10-25 – 2013-10-27 (×3): 80 mg via ORAL
  Filled 2013-10-24 (×3): qty 2

## 2013-10-24 MED ORDER — FUROSEMIDE 10 MG/ML IJ SOLN
40.0000 mg | Freq: Two times a day (BID) | INTRAMUSCULAR | Status: AC
  Administered 2013-10-25: 40 mg via INTRAVENOUS
  Filled 2013-10-24 (×2): qty 4

## 2013-10-24 MED ORDER — IBUPROFEN 400 MG PO TABS: 600.0000 mg | ORAL_TABLET | Freq: Once | ORAL | Status: DC

## 2013-10-24 MED ORDER — BUDESONIDE-FORMOTEROL FUMARATE 160-4.5 MCG/ACT IN AERO
1.0000 | INHALATION_SPRAY | Freq: Two times a day (BID) | RESPIRATORY_TRACT | Status: DC
  Administered 2013-10-24 – 2013-10-27 (×6): 1 via RESPIRATORY_TRACT
  Filled 2013-10-24: qty 6

## 2013-10-24 NOTE — ED Provider Notes (Signed)
CSN: [id number]     Arrival date & time 10/24/13  4656 History   First MD Initiated Contact with Patient 10/24/13 [phone]     Chief Complaint  Patient presents with  . Shortness of Breath  . Chest Pain  . Leg Pain     (Consider location/radiation/quality/duration/timing/severity/associated sxs/prior Treatment) Patient is a 53 y.o. female presenting with shortness of breath, chest pain, and leg pain. The history is provided by the patient. No language interpreter was used.  Shortness of Breath Severity:  Moderate Onset quality:  Gradual Duration:  2 days Timing:  Constant Progression:  Worsening Chronicity:  Chronic Relieved by:  Rest Worsened by:  Exertion and movement Ineffective treatments:  Oxygen and inhaler Associated symptoms: chest pain and sputum production (chronic, unchanged)   Associated symptoms: no abdominal pain, no cough, no diaphoresis, no fever, no headaches, no neck pain, no sore throat and no vomiting   Associated symptoms comment:  Nausea  Chest pain:    Quality:  Aching (started in L leg and radiates up to chest and jaw)   Severity:  Moderate   Duration:  1 day   Timing:  Intermittent   Progression:  Waxing and waning   Chronicity:  New Risk factors comment:  Hx of HTN, sarcoidosis, CHF, pulm HTN, cardiomyopathy Chest Pain Associated symptoms: shortness of breath   Associated symptoms: no abdominal pain, no back pain, no cough, no diaphoresis, no fatigue, no fever, no headache, no nausea, no numbness, no palpitations, not vomiting and no weakness   Leg Pain Associated symptoms: no back pain, no fatigue, no fever and no neck pain     Past Medical History  Diagnosis Date  . GERD (gastroesophageal reflux disease)   . Hypertension   . 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 CHF (congestive heart failure)     Right side- Dr. Aundra Dubin ([facility])  . Pulmonary HTN   . Anxiety   . Cardiomyopathy   . NSVT (nonsustained ventricular tachycardia)     with  syncope: St Jude ICD was implanted. Device now nearing ERI. Given end stage lung disease and normalization of LV function, the device will not be replaced and tachy therapies were turned off  . Chronic chest pain     LHC (08/2008) with no angiographic coronary diease  . Allergic rhinitis   . H/O: iron deficiency anemia   . Secondary amenorrhea     irregular menses  . Psychosis     with high dose steroids  . SVT (supraventricular tachycardia)     possible runs  . Hypokalemia     recurrent  . Rotator cuff sprain   . Chronic back pain    Past Surgical History  Procedure Laterality Date  . Cardiac catheterization  08/26/2008    5% EF with normal coronary arteries and normal wall motion  . Adenosine cardiolyte  03/31/2003    no ischemia/infarct; marked global LV hypekinesis; resting EF 22%  . Cardiac catheterization  03/31/2003    globally depressed LV fxn with EF 20%; idiopathic dilated cardiomyopathy  . Defibrillator placed  03/31/2003    St. Jude single chamber (Dr. Cristopher Peru)  . Pfts      FVC 2000 (56%) FEV1 1400 (47%), ratio 0.7; insignif response to bronchodilatior; stable from 1/05 - 9/05; PFTs: Mod restriction, FVC 1.9L, FEV1 1.6L, both 53% predicted; slt response to brochodilators 04/08/2002   Family History  Problem Relation Age of Onset  . Lung cancer Father   .  Cancer Father     lung cancer  . Hypertension    . Diabetes Mother     border line  . Hypertension Mother   . Heart failure Mother    History  Substance Use Topics  . Smoking status: Former Smoker -- 1.50 packs/day for 11 years    Types: Cigarettes    Quit date: 04/08/1992  . Smokeless tobacco: Never Used     Comment: 1ppd x 20 years  . Alcohol Use: Yes     Comment: remote history of alcohol abuse (quit 1994)   OB History   Grav Para Term Preterm Abortions TAB SAB Ect Mult Living                 Review of Systems  Constitutional: Negative for fever, chills, diaphoresis, activity change, appetite  change and fatigue.  HENT: Negative for congestion, facial swelling, rhinorrhea and sore throat.   Eyes: Negative for photophobia and discharge.  Respiratory: Positive for sputum production (chronic, unchanged) and shortness of breath. Negative for cough and chest tightness.   Cardiovascular: Positive for chest pain. Negative for palpitations and leg swelling.  Gastrointestinal: Negative for nausea, vomiting, abdominal pain and diarrhea.  Endocrine: Negative for polydipsia and polyuria.  Genitourinary: Negative for dysuria, frequency, difficulty urinating and pelvic pain.  Musculoskeletal: Negative for arthralgias, back pain, neck pain and neck stiffness.  Skin: Negative for color change and wound.  Allergic/Immunologic: Negative for immunocompromised state.  Neurological: Negative for facial asymmetry, weakness, numbness and headaches.  Hematological: Does not bruise/bleed easily.  Psychiatric/Behavioral: Negative for confusion and agitation.      Allergies  Latex; Nitroglycerin; Ace inhibitors; Meperidine hcl; Other; Tramadol hcl; and Propoxyphene n-acetaminophen  Home Medications   Prior to Admission medications   Medication Sig Start Date End Date Taking? Authorizing Provider  albuterol (PROAIR HFA) 108 (90 BASE) MCG/ACT inhaler Inhale 2 puffs into the lungs 4 (four) times daily. 10/01/13  Yes Willeen Niece, MD  ARIPiprazole (ABILIFY) 2 MG tablet Take 1 tablet (2 mg total) by mouth daily. 09/03/13  Yes Waylan Boga, NP  aspirin EC 81 MG tablet Take 1 tablet (81 mg total) by mouth daily. 08/21/13  Yes Waylan Boga, NP  budesonide-formoterol (SYMBICORT) 160-4.5 MCG/ACT inhaler Inhale 1 puff into the lungs 2 (two) times daily.   Yes Historical Provider, MD  carvedilol (COREG) 6.25 MG tablet Take 1 tablet (6.25 mg total) by mouth 2 (two) times daily with a meal. 08/21/13  Yes Waylan Boga, NP  esomeprazole (NEXIUM) 40 MG capsule Take 40 mg by mouth every morning.   Yes Historical Provider,  MD  furosemide (LASIX) 40 MG tablet Take 1 tablet (40 mg total) by mouth every other day. 08/21/13  Yes Waylan Boga, NP  Iloprost (VENTAVIS) 20 MCG/ML SOLN Inhale 20 mcg into the lungs 6 (six) times daily.   Yes Historical Provider, MD  PARoxetine (PAXIL) 20 MG tablet Take 1 tablet (20 mg total) by mouth daily. 08/31/13  Yes Shuvon Rankin, NP  potassium chloride SA (K-DUR,KLOR-CON) 20 MEQ tablet Take 20-40 mEq by mouth 2 (two) times daily. 40 meq in the morning and 20 meq at night   Yes Historical Provider, MD  sildenafil (REVATIO) 20 MG tablet Take 4 tablets (80 mg total) by mouth 3 (three) times daily. take 4  tablets every 8 hours 08/21/13  Yes Waylan Boga, NP  Treprostinil (TYVASO) 0.6 MG/ML SOLN Inhale 18 mcg into the lungs 4 (four) times daily. 10/18/13   Larey Dresser,  MD   BP 128/76  Pulse 76  Temp(Src) 97.9 F (36.6 C) (Oral)  Resp 19  Ht _0  (1.6 m)  Wt 155 lb (70.308 kg)  BMI 27.46 kg/m2  SpO2 92% Physical Exam  Constitutional: She is oriented to person, place, and time. She appears well-developed and well-nourished. No distress.  HENT:  Head: Normocephalic and atraumatic.  Mouth/Throat: No oropharyngeal exudate.  Eyes: Pupils are equal, round, and reactive to light.  Neck: Normal range of motion. Neck supple.  Cardiovascular: Normal rate, regular rhythm and normal heart sounds.  Exam reveals no gallop and no friction rub.   No murmur heard. Pulmonary/Chest: Effort normal. No respiratory distress. She has wheezes in the right middle field. She has rales in the right lower field and the left lower field.  Abdominal: Soft. Bowel sounds are normal. She exhibits no distension and no mass. There is no tenderness. There is no rebound and no guarding.  Musculoskeletal: Normal range of motion. She exhibits no edema and no tenderness.       Legs: Neurological: She is alert and oriented to person, place, and time.  Skin: Skin is warm and dry.  Psychiatric: She has a normal mood and  affect.    ED Course  Procedures (including critical care time) Labs Review Labs Reviewed  CBC WITH DIFFERENTIAL - Abnormal; Notable for the following:    RBC 3.81 (*)    Hemoglobin 10.8 (*)    HCT 34.8 (*)    All other components within normal limits  PRO B NATRIURETIC PEPTIDE - Abnormal; Notable for the following:    Pro B Natriuretic peptide (BNP) 661.8 (*)    All other components within normal limits  BASIC METABOLIC PANEL  I-STAT TROPOININ, ED  Randolm Idol, ED    Imaging Review Dg Chest 2 View  10/24/2013   CLINICAL DATA:  Shortness of breath.  Chest pain.  COPD.  EXAM: CHEST  2 VIEW  COMPARISON:  09/20/2013  FINDINGS: Cardiomegaly remains stable and AICD remains in place. Diffuse chronic interstitial lung disease with upper lobe bronchiectasis and superior hilar retraction appears stable. No evidence acute or superimposed pulmonary opacity. No evidence of pleural effusion. No evidence of pneumothorax.  IMPRESSION: Stable cardiomegaly and severe chronic interstitial lung disease. No acute findings.   Electronically Signed   By: Earle Gell M.D.   On: 10/24/2013 10:46     EKG Interpretation   Date/Time:  Sunday October 24 2013 09:38:33 EDT Ventricular Rate:  75 PR Interval:  199 QRS Duration: 100 QT Interval:  439 QTC Calculation: 490 R Axis:   81 Text Interpretation:  Sinus rhythm Left ventricular hypertrophy Borderline  prolonged QT interval Confirmed by Leveta Wahab  MD, Zaia Carre ([id number]) on 10/24/2013  10:02:03 AM      MDM   Final diagnoses:  Shortness of breath  Chest pain, unspecified chest pain type  Pain of left lower extremity  Hypoxia    Pt is a 53 y.o. female with Pmhx as above who presents with inc SOB from baseline, L leg pain for several months, today with radiation up to her chest and jaw with tingling in her fingers. This pain is intermittent On PE< pt was hypoxic on RA (wears 4-5 L at home), she had slight wheezing & crackles at bases. No significant LE  edema. +genrealized ttp of L leg w/o changes from R.   No acute CXR findings, delta trop neg x2, though pt has inc O2 requirement form baseline, currently at 7L  and desats easily w/ mvmt. Prednisone and second duoneb given. Family med will admit. Spoke w/ Dr. Harrington Challenger w/ cardiology who agrees CP likely not ACS.         Neta Ehlers, MD 10/24/13 [phone]

## 2013-10-24 NOTE — ED Notes (Signed)
Pt now c/o dizziness and states her headache is the same (like drum beating in back of head - previously she stated pain was frontal).  Some pvc's noted on monitor, though pt remained dizzy even though no longer in pvc's.  Dr Tawnya Crook notified.

## 2013-10-24 NOTE — ED Notes (Signed)
Pt undressed, in gown, on monitor, continuous pulse oximetry, blood pressure cuff and oxygen NRB (15L); pt normally wears 4-5L of oxygen [place] at home all the time; pt states 53 - 55% RA

## 2013-10-24 NOTE — H&P (Signed)
[facility] Admission History and Physical Service Pager: [phone]  Patient name: Rachel [name] Medical record number: [id number] Date of birth: [birth year] Age: 53 y.o. Gender: female  Primary Care Provider: Christa See, MD Consultants: None  Code Status: Full  Chief Complaint: Shortness of breath  Assessment and Plan: Rachel [name] is a 53 y.o. female presenting with shortness of breath. PMH is significant for pulmonary sarcoidosis, anxiety, hypertension, pulmonary hypertension, cardiomyopathy, CHF, GERD, SVT, MDD.  #Dyspnea: CXR showed evidence of chronic interstitial lung disease. Had an increased O2 requirement of 7L from baseline of 4-5L. ponse. Dyspnea improved with O2. Continue home dosing of inhaled breathing treatments. Do to chest x-ray and a lack of fever the likelihood of pneumonia is not high. But we'll continue to monitor her vitals, and possible followup chest x-ray if symptoms begin to change. Admitted and treated in April with similar symptoms. Unsure if symptoms are an exacerbation of underlying sarcoid or related to CHF or infection.  - Admitted to telemetry, Dr. Erin Hearing attending.  - Pulmonology consult: call in morning.  - if patient is still symptomatic and not clinically improving, could start PO ABX empirically  - if patient febrile and looking ill, start broad spectrum.  - Continue Duoneb, Dulera, Iloprost (home medication) - Continue supplemental O2. - Begin prednisone 10 mg - Followup EKG in a.m.  #Diastolic CHF: most recent ECHO EF 60% (4/22), mild LVH, grade 1 diastolic dysfunction. Doesn't appear to be fluid overloaded but BNP elevated 661. No edema and no sign infection. Takes lasix 40 mg PO every other day   - cycle troponin's  - TSH: normal  - Lasix 40 mg IV for two doses; re-evaluate after output and respiratory status.  - Continuing home Coreg regimen - Monitor in/out  - Daily weight  - Followup EKG in  a.m.  #Stage IV Sarcoidosis: End stage. On home 4L to 6L O2. has been evaluated at [facility] for heart/lung transplant but doesn't have resources.  - is intolerant (psychosis) to high dose prednisone  - prednisone 17m daily   #Cardiomyopathy: Stable as seen on CXR:   #Pulmonary hypertension w/ RV failure: Moderate to severe, likely secondary to sarcoidosis and parenchymal lung disease/hypoxic pulmonary vasoconstriction. - continued home sildenafil and Iloprost.   #Supraventricular tachycardia w/ St. Jude ICD: History of during her last admission. Controlled upon admission.  - coreg 6.229m- the tachy therapies have been turned off.   #GERD:   pantaprazole daily.   #Hypertension: home regimen  -  coreg 6.25 BID   #Mood disorder: Has a history of depression and anxiety. Stable and reports no problems upon questioning in ED.  - continued patient's risperidone at night  FEN/GI: Heart Healthy Prophylaxis: SQ heparin  Disposition: admitted to telemetry for SOB pending improvement of symptoms.   History of Present Illness: Rachel [name] a [age].o. female presenting with chest pain. Patient currently has advanced home care. She reports she woke up at 5 AM and began noticing shortness of breath while she was getting ready. Her Simpson's persisted despite throughout the morning even after resting in bed. She did her pulmonary hypertension treatment and got some relief. She took Symbicort and an inhaler which provided no improvement. She also states that for the past 2 weeks her left leg from her toes to hip have been throbbing. This morning this pain radiated to her chest and she started to notice paresthesias in her left arm. She began developing a headache  and was diaphoretic. She experienced nausea and dry heaves. She has had diarrhea for the past couple days. She is noticed a productive cough which produces rusty colored sputum, this had been clear in the past. She states that this cough is very  similar to the one she was experiencing when she recently saw Dr. Venetia Maxon. She denies any recent changes in medications and states that she is very compliant with taking her medications.  Patient has an extensive past medical history which involves a similar encounter experienced in April of this year which required admission to the hospital.   Review Of Systems: Per HPI Otherwise 12 point review of systems was performed and was unremarkable.  Patient Active Problem List   Diagnosis Date Noted  . Shortness of breath 10/24/2013  . Major depressive disorder with psychotic features 08/31/2013  . Acute bronchitis 08/17/2013  . Loss of weight 08/12/2013  . SVT (supraventricular tachycardia) 08/05/2013  . Dyspnea 07/28/2013  . Bruising 10/22/2012  . Acute sinusitis, unspecified 07/20/2012  . Encounter for medication refill 06/08/2012  . Overweight 11/05/2011  . CONGESTIVE HEART FAILURE, RIGHT 09/21/2008  . PULMONARY HYPERTENSION 09/05/2008  . SINUS TACHYCARDIA 08/22/2008  . PULMONARY SARCOIDOSIS 05/03/2008  . ANXIETY 06/05/2006  . HYPERTENSION, BENIGN SYSTEMIC 06/05/2006  . CARDIOMYOPATHY, IDIOPATHIC 06/05/2006  . GASTROESOPHAGEAL REFLUX, NO ESOPHAGITIS 06/05/2006   Past Medical History: Past Medical History  Diagnosis Date  . GERD (gastroesophageal reflux disease)   . Hypertension   . 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 CHF (congestive heart failure)     Right side- Dr. Aundra Dubin ([facility])  . Pulmonary HTN   . Anxiety   . Cardiomyopathy   . NSVT (nonsustained ventricular tachycardia)     with syncope: St Jude ICD was implanted. Device now nearing ERI. Given end stage lung disease and normalization of LV function, the device will not be replaced and tachy therapies were turned off  . Chronic chest pain     LHC (08/2008) with no angiographic coronary diease  . Allergic rhinitis   . H/O: iron deficiency anemia   . Secondary amenorrhea     irregular menses  . Psychosis     with  high dose steroids  . SVT (supraventricular tachycardia)     possible runs  . Hypokalemia     recurrent  . Rotator cuff sprain   . Chronic back pain    Past Surgical History: Past Surgical History  Procedure Laterality Date  . Cardiac catheterization  08/26/2008    5% EF with normal coronary arteries and normal wall motion  . Adenosine cardiolyte  03/31/2003    no ischemia/infarct; marked global LV hypekinesis; resting EF 22%  . Cardiac catheterization  03/31/2003    globally depressed LV fxn with EF 20%; idiopathic dilated cardiomyopathy  . Defibrillator placed  03/31/2003    St. Jude single chamber (Dr. Cristopher Peru)  . Pfts      FVC 2000 (56%) FEV1 1400 (47%), ratio 0.7; insignif response to bronchodilatior; stable from 1/05 - 9/05; PFTs: Mod restriction, FVC 1.9L, FEV1 1.6L, both 53% predicted; slt response to brochodilators 04/08/2002   Social History: History  Substance Use Topics  . Smoking status: Former Smoker -- 1.50 packs/day for 11 years    Types: Cigarettes    Quit date: 04/08/1992  . Smokeless tobacco: Never Used     Comment: 1ppd x 20 years  . Alcohol Use: Yes     Comment: remote history of alcohol abuse (  quit 1994)   Additional social history: None  Please also refer to relevant sections of EMR.  Family History: Family History  Problem Relation Age of Onset  . Lung cancer Father   . Cancer Father     lung cancer  . Hypertension    . Diabetes Mother     border line  . Hypertension Mother   . Heart failure Mother    Allergies and Medications: Allergies  Allergen Reactions  . Latex Rash  . Nitroglycerin Other (See Comments)    Patient is on Revatio  . Ace Inhibitors     unknown  . Meperidine Hcl Other (See Comments)    REACTION: Makes her feel funny  . Other Other (See Comments)    High Dose Steroids cause chemical imbalance and altered mental status  . Tramadol Hcl Other (See Comments)    REACTION: nausea, lightheadedness, sleepiness, and  dizziness  . Propoxyphene N-Acetaminophen Rash    REACTION: rash: pruritus   No current facility-administered medications on file prior to encounter.   Current Outpatient Prescriptions on File Prior to Encounter  Medication Sig Dispense Refill  . albuterol (PROAIR HFA) 108 (90 BASE) MCG/ACT inhaler Inhale 2 puffs into the lungs 4 (four) times daily.  18 g  11  . ARIPiprazole (ABILIFY) 2 MG tablet Take 1 tablet (2 mg total) by mouth daily.  30 tablet  1  . aspirin EC 81 MG tablet Take 1 tablet (81 mg total) by mouth daily.  1 tablet  0  . budesonide-formoterol (SYMBICORT) 160-4.5 MCG/ACT inhaler Inhale 1 puff into the lungs 2 (two) times daily.      . carvedilol (COREG) 6.25 MG tablet Take 1 tablet (6.25 mg total) by mouth 2 (two) times daily with a meal.  60 tablet  0  . esomeprazole (NEXIUM) 40 MG capsule Take 40 mg by mouth every morning.      . furosemide (LASIX) 40 MG tablet Take 1 tablet (40 mg total) by mouth every other day.  45 tablet  3  . PARoxetine (PAXIL) 20 MG tablet Take 1 tablet (20 mg total) by mouth daily.  30 tablet  1  . potassium chloride SA (K-DUR,KLOR-CON) 20 MEQ tablet Take 20-40 mEq by mouth 2 (two) times daily. 40 meq in the morning and 20 meq at night      . sildenafil (REVATIO) 20 MG tablet Take 4 tablets (80 mg total) by mouth 3 (three) times daily. take 4  tablets every 8 hours  10 tablet  0  . Treprostinil (TYVASO) 0.6 MG/ML SOLN Inhale 18 mcg into the lungs 4 (four) times daily.  3.6 mL  11    Objective: BP 128/76  Pulse 76  Temp(Src) 97.9 F (36.6 C) (Oral)  Resp 19  Ht _0  (1.6 m)  Wt 155 lb (70.308 kg)  BMI 27.46 kg/m2  SpO2 92% Exam: General -- oriented x3, pleasant and cooperative. HEENT -- Head is normocephalic. Eyes, pupils equal and round and react to light and accommodation. Left eye appears slightly erythematous, with specific irritation noted in the 9-12 o'clock region. Exam difficult due to patient non-compliance. Extraocular motions are  intact. Ears, nose and throat were benign. Neck -- supple; no JVD Integument -- intact. No rash, erythema, or ecchymoses.  Chest -- good expansion. Lungs with bilateral "crackles" (velcrow) throughout.  Cardiac -- RRR. No murmurs noted.  Abdomen -- soft, mild tenderness in bilateral groin and epigastrium. No masses palpable. Normal bowel sounds present. CNS -- no  gross deficits. Decreased sensation noted at distal LLE throughout. Patient noted some "cramping pain" along LLE as well.  Musculoskeletal - no tenderness or effusions noted. ROM good. 5/5 bilateral strength. Dorsalis pedis pulses present and symmetrical.     Labs and Imaging: CBC BMET   Recent Labs Lab 10/24/13 1024  WBC 8.7  HGB 10.8*  HCT 34.8*  PLT 277    Recent Labs Lab 10/24/13 1024  NA 143  K 4.2  CL 98  CO2 30  BUN 14  CREATININE 0.65  GLUCOSE 87  CALCIUM 9.1       Recent Labs Lab 10/24/13 1820  TROPONINI <0.30   CXR (7/19) IMPRESSION:  Stable cardiomegaly and severe chronic interstitial lung disease. No  acute findings.  EKG: NSR, LVH, borderline QT prolongation.   Elberta Leatherwood, MD 10/24/2013, 4:04 PM PGY-1, [place] Intern pager: [phone], text pages welcome  Upper Level Addendum:  I have seen and evaluated this patient along with Dr. Alease Frame and reviewed the above note, making necessary revisions in [facility].   Clearance Coots, MD Family Medicine PGY-2

## 2013-10-24 NOTE — ED Notes (Signed)
Per EMS: hx of copd, on 4l @ home also takes symbicort and albuterol.  EMS called today for sob, FD arrived, pt found to be dyspnic and wheezing, given 5 mg albuterol, wheezes cleared up.  Still not feeling well, also has cp and dizziness, sob.  Has had ongoing left leg pain that seems to get worse.  EMS noted pt was 85% on 4 L, started on NRB and spo2 increased to 98%.  Patient states she feels jittery, left leg hurts, left arm and jaw is hurting.  No crackles noted on auscultation per EMS.  On arrival room air spo2 was 53%, NRB placed back on patient increased to 100%. HR: 71.  C/o headache as well.

## 2013-10-24 NOTE — ED Notes (Signed)
Family practice at bedside.

## 2013-10-24 NOTE — ED Notes (Signed)
2 warm blankets given to pt

## 2013-10-25 DIAGNOSIS — M79609 Pain in unspecified limb: Secondary | ICD-10-CM

## 2013-10-25 DIAGNOSIS — R0602 Shortness of breath: Secondary | ICD-10-CM

## 2013-10-25 LAB — BASIC METABOLIC PANEL
ANION GAP: 14 (ref 5–15)
BUN: 12 mg/dL (ref 6–23)
CO2: 29 mEq/L (ref 19–32)
Calcium: 9.5 mg/dL (ref 8.4–10.5)
Chloride: 98 mEq/L (ref 96–112)
Creatinine, Ser: 0.7 mg/dL (ref 0.50–1.10)
GFR calc Af Amer: >90 mL/min (ref >90)
GFR calc non Af Amer: >90 mL/min (ref >90)
Glucose, Bld: 88 mg/dL (ref 70–99)
Potassium: 3.9 mEq/L (ref 3.7–5.3)
SODIUM: 141 meq/L (ref 137–147)

## 2013-10-25 LAB — CBC
HCT: 36.4 % (ref 36.0–46.0)
Hemoglobin: 11.6 g/dL — ABNORMAL LOW (ref 12.0–15.0)
MCH: 29 pg (ref 26.0–34.0)
MCHC: 31.9 g/dL (ref 30.0–36.0)
MCV: 91 fL (ref 78.0–100.0)
PLATELETS: 288 10*3/uL (ref 150–400)
RBC: 4 MIL/uL (ref 3.87–5.11)
RDW: 14.5 % (ref 11.5–15.5)
WBC: 8.1 10*3/uL (ref 4.0–10.5)

## 2013-10-25 LAB — TROPONIN I
Troponin I: <0.3 ng/mL (ref <0.30)
Troponin I: <0.3 ng/mL (ref <0.30)

## 2013-10-25 LAB — GLUCOSE, RANDOM: GLUCOSE: 138 mg/dL — AB (ref 70–99)

## 2013-10-25 MED ORDER — ACETAMINOPHEN 325 MG PO TABS
650.0000 mg | ORAL_TABLET | Freq: Once | ORAL | Status: AC
  Administered 2013-10-25: 650 mg via ORAL

## 2013-10-25 MED ORDER — ACETAMINOPHEN 325 MG PO TABS
650.0000 mg | ORAL_TABLET | Freq: Four times a day (QID) | ORAL | Status: DC | PRN
  Administered 2013-10-25: 650 mg via ORAL
  Filled 2013-10-25: qty 2

## 2013-10-25 MED ORDER — IPRATROPIUM-ALBUTEROL 0.5-2.5 (3) MG/3ML IN SOLN
3.0000 mL | Freq: Four times a day (QID) | RESPIRATORY_TRACT | Status: DC
  Administered 2013-10-26 (×4): 3 mL via RESPIRATORY_TRACT
  Filled 2013-10-25 (×5): qty 3

## 2013-10-25 NOTE — H&P (Signed)
Family Medicine Teaching Service Attending Note  I interviewed and examined patient Rachel [name] and reviewed their tests and x-rays.  I discussed with Dr. Raeford Razor and reviewed their note for today.  I agree with their assessment and plan.     Additionally  Feels like her breathing is back to normal.  It is worsend by the pain in her left leg which is associated with lip numbness and bilateral hand tingling Left leg "jumps and twithces" and pain radiates down from back.  Able to stand and walk with walker distal strength is 4+ bilaterally in legs Pain is increased with range of motion of hip.  No definite SLR  Breathing - seems at baseline after diuretic.  Ask pulmonary for their assessment since follow her chronically  Pain in Leg - seems chronic.  Review prior evaluation and since sounds nerve related consider gabapentin low dose to start

## 2013-10-25 NOTE — Progress Notes (Signed)
Utilization review completed. Taje Littler, RN, BSN. 

## 2013-10-25 NOTE — Progress Notes (Signed)
Notified via text from CMT that patient had a 2.64 second pause-unable to find alarm on telemetry.  Patient also having c/o LLE pain.  Dr Elyn Peers notified of both, received PRN order for Tylenol.  Will administer as ordered and continue to monitor.  Tresa Endo

## 2013-10-25 NOTE — Progress Notes (Signed)
FMTS Attending Daily Note:  Annabell Sabal MD  [phone] pager  Family Practice pager:  [phone] I have discussed this patient with the resident Dr. Alease Frame and attending physician Dr. Erin Hearing.  I agree with their findings, assessment, and care plan

## 2013-10-25 NOTE — Plan of Care (Addendum)
Problem: Phase I Progression Outcomes Goal: EF % per last Echo/documented,Core Reminder form on chart Outcome: Completed/Met Date Met:  10/25/13 EF 60% (07/2013)

## 2013-10-25 NOTE — Progress Notes (Signed)
*  PRELIMINARY RESULTS* Vascular Ultrasound Left lower extremity venous duplex has been completed.  Preliminary findings: Left:  No evidence of DVT, superficial thrombosis, or Baker's cyst.   Landry Mellow, RDMS, RVT  10/25/2013, 12:51 PM

## 2013-10-25 NOTE — Progress Notes (Signed)
Family Medicine PGY-3 PCP Note  I saw Rachel [name] at bedside as her PCP. She states her breathing is much better but continues to have significant leg pain and muscle cramping, for which she requests something non-sedating for pain; she takes Tylenol at home and got some earlier today with decent relief. Otherwise, she states she feels some better, but still has significant chronic issues that are at her baseline.  I greatly appreciate the excellent care provided by the [facility] and all consultants, nursing, and ancillary staff on behalf of my patient. I will order Tylenol PRN for her pain and will discuss with the on-call residents. Otherwise, I will be happy to see Rachel Elsbury in outpt follow up once she is stable for discharge.  Emmaline Kluver, MD PGY-3, [place] [facility] 10/25/2013, 5:33 PM First call: [place] pager [phone] (text pages welcome through [facility]) Personal pager: [phone]

## 2013-10-25 NOTE — Progress Notes (Signed)
Family Medicine Teaching Service Daily Progress Note Intern Pager: [phone]  Patient name: Rachel [name] Medical record number: [id number] Date of birth: 09-16-1960 Age: 53 y.o. Gender: female  Primary Care Provider: Christa See, MD Consultants: None Code Status: Full  Pt Overview and Major Events to Date:  7/19: Admitted to hospital from ED  Assessment and Plan: Rachel [name] is a 53 y.o. female presenting with shortness of breath. PMH is significant for pulmonary sarcoidosis, anxiety, hypertension, pulmonary hypertension, cardiomyopathy, CHF, GERD, SVT, MDD.   #Dyspnea: CXR showed evidence of chronic interstitial lung disease. Had an increased O2 requirement of 7L from baseline of 4-5L. ponse. Dyspnea improved with O2. Continue home dosing of inhaled breathing treatments. Do to chest x-ray and a lack of fever the likelihood of pneumonia is not high. But we'll continue to monitor her vitals, and possible followup chest x-ray if symptoms begin to change. Admitted and treated in April with similar symptoms. Unsure if symptoms are an exacerbation of underlying sarcoid or related to CHF or infection.  - ??Pulmonology consult: considering (previously seen by Dr. Lamonte Sakai - Holding any Abx  - Continue Duoneb, Dulera, Iloprost (home medication)  - Continue supplemental O2.  - Begin prednisone 10 mg  - Followup EKG>>no change from previous; Troponin neg x3; TSH wnl - Venous Doppler of LLE ordered   #Diastolic CHF: most recent ECHO EF 60% (4/22), mild LVH, grade 1 diastolic dysfunction. Doesn't appear to be fluid overloaded but BNP elevated 661. No edema and no sign infection. Takes lasix 40 mg PO every other day  - cycle troponin>>neg x3 - TSH: wnl - Lasix 40 mg IV for two doses; re-evaluate after output and respiratory status.  - Continuing home Coreg regimen  - Monitor in/out  - Daily weight    #Stage IV Sarcoidosis: End stage. On home 4L to 6L O2. has been evaluated at [facility] for  heart/lung transplant but doesn't have resources.  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  - continued home sildenafil and Iloprost.   #Supraventricular tachycardia w/ St. Jude ICD: History of during her last admission. Controlled upon admission.  - coreg 6.2563m- the tachy therapies have been turned off.   # Leg Pain; suspect neuropathic etiology. As noted to have significant left-sided leg pain and tenderness running toe to hip. Patient states that this pain is throbbing in nature. She is also experiencing muscular spasms resulting in spastic plantarflexion. She reports no weakness or radiating back pain. No reports of bowel or urinary incontinence. Reviewing previous notes patient seems to have experienced similar pain in this left leg. - Venous duplex performed; no evidence of DVT superficial thrombosis or Baker's cyst noted - Will consider possible lumbar MRI, rule out retinopathy.  #GERD:  pantaprazole daily.   #Hypertension: home regimen  - coreg 6.25 BID   #Mood disorder: Has a history of depression and anxiety. Stable and reports no problems upon questioning in ED.  - continued patient's risperidone at night   FEN/GI: Heart Healthy PPx: SQ Heparin  Disposition: Home Healthcare prior to admission  Subjective:  Patient reports that her breathing is significantly improved today. She states she still has some issues with shortness of breath with any activity. Today her main concern seems to be with her  left lower extremity. Left lower extremity appears to be painful both laterally and medially from toe to hip. Today she is experiencing muscle spasms of her distal left leg resulting in plantarflexion. She reports no new  weakness in this leg. No changes in bowel or bladder. No fever chills.  Objective: Temp:  [97.5 F (36.4 C)-98.3 F (36.8 C)] 98.1 F (36.7 C) (07/20 0600) Pulse Rate:  [63-84] 84 (07/20 0600) Resp:  [15-27] 20 (07/20 0600) BP: (109-155)/(59-88) 146/71 mmHg (07/20 0600) SpO2:  [89 %-100 %] 93 % (07/20 1119) FiO2 (%):  [7 %-44 %] 44 % (07/20 1119) Weight:  [161 lb 6 oz (73.2 kg)-161 lb 11.2 oz (73.347 kg)] 161 lb 11.2 oz (73.347 kg) (07/20 0600) Physical Exam: General -- oriented x3, pleasant and cooperative.  HEENT -- Head is normocephalic. Eyes, pupils equal and round and react to light and accommodation. Left eye appears slightly erythematous, with specific irritation noted in the 9-12 o'clock region. Exam difficult due to patient non-compliance. Extraocular motions are intact. Ears, nose and throat were benign.  Neck -- supple; no JVD  Integument -- intact. No rash, erythema, or ecchymoses.  Chest -- good expansion. Lungs with bilateral "crackles" (velcrow) throughout.  Cardiac -- RRR. No murmurs noted.  Abdomen -- soft, mild tenderness in bilateral groin and epigastrium. No masses palpable. Normal bowel sounds present.  CNS -- no gross deficits. Decreased sensation noted at distal LLE throughout. Patient noted some "cramping pain" along LLE as well.  Musculoskeletal - no effusions noted. Left lower leg is reported to be tender medially and laterally. Intermittent muscle spasms resulting in plantarflexion noted. Babinski positive on left side. Dorsalis pedis pulses present and symmetrical.    Laboratory:  Recent Labs Lab 10/24/13 1024 10/25/13 1023  WBC 8.7 8.1  HGB 10.8* 11.6*  HCT 34.8* 36.4  PLT 277 288    Recent Labs Lab 10/24/13 1024 10/25/13 0909 10/25/13 1023  NA 143  --  141  K 4.2  --  3.9  CL 98  --  98  CO2 30  --  29  BUN 14  --  12  CREATININE 0.65  --  0.70  CALCIUM 9.1  --  9.5  GLUCOSE 87 138* 88    TSH wnl Troponin negx3  Imaging/Diagnostic  Tests: CXR (7/19) IMPRESSION:  Stable cardiomegaly and severe chronic interstitial lung disease. No  acute findings.  Vascular Ultrasound  Left lower extremity venous duplex has been completed. Preliminary findings: Left: No evidence of DVT, superficial thrombosis, or Baker's cyst.   Elberta Leatherwood, MD 10/25/2013, 2:00 PM PGY-1, [place] Intern pager: [phone], text pages welcome

## 2013-10-25 NOTE — Progress Notes (Signed)
Patient ambulated to the door x2 with walker and standby assistance.

## 2013-10-26 ENCOUNTER — Inpatient Hospital Stay (HOSPITAL_COMMUNITY): Payer: Medicare Other

## 2013-10-26 DIAGNOSIS — I509 Heart failure, unspecified: Secondary | ICD-10-CM

## 2013-10-26 DIAGNOSIS — D869 Sarcoidosis, unspecified: Principal | ICD-10-CM

## 2013-10-26 DIAGNOSIS — I10 Essential (primary) hypertension: Secondary | ICD-10-CM

## 2013-10-26 DIAGNOSIS — R259 Unspecified abnormal involuntary movements: Secondary | ICD-10-CM | POA: Diagnosis present

## 2013-10-26 DIAGNOSIS — R292 Abnormal reflex: Secondary | ICD-10-CM

## 2013-10-26 NOTE — Clinical Documentation Improvement (Signed)
  Please specify for greater CHF acuity Possible Clinical Conditions?  Chronic Diastolic Congestive Heart Failure Acute Diastolic Congestive Heart Failure Acute on Chronic Diastolic Congestive Heart Failure  Other Condition________________________________________ Cannot Clinically Determine   Diastolic CHF: most recent ECHO EF 60% (4/22), mild LVH, grade 1 diastolic dysfunction. Doesn't appear to be fluid overloaded but BNP elevated 661. No edema and no sign infection. Takes lasix 40 mg PO every other day  - cycle troponin>>neg x3  - TSH: wnl  - Lasix 40 mg IV for two doses; re-evaluate after output and respiratory status.  - Continuing home Coreg regimen  - Monitor in/out  - Daily weight     Risk Factors: HTN, Stage IV Sarcoidosis, CHF Right side,  pulm HTN, cardiomyopathy  Diagnostics: Component     Latest Ref Rng 08/21/2013 09/20/2013 10/24/2013  Pro B Natriuretic peptide (BNP)     0 - 125 pg/mL 272.9 (H) 286.3 (H) 661.8 (H)  Chest: has rales in the right lower field and the left lower field.   Treatment:   AT home pt on  furosemide (LASIX) 40 MG tablet  Take 1 tablet (40 mg total) by mouth every other day Per H&P:  Lasix 40 mg IV for two dosesContinuing home Coreg regimen  - Monitor in/out  - Daily weight  - Followup EKG     Thank You, Philippa Chester ,RN Clinical Documentation Specialist:  [place] [facility] Information Management

## 2013-10-26 NOTE — Progress Notes (Signed)
Family Medicine Teaching Service Daily Progress Note Intern Pager: [phone]  Patient name: Rachel [name] Medical record number: [id number] Date of birth: [birth year] Age: 53 y.o. Gender: female  Primary Care Provider: Christa See, MD Consultants: None Code Status: Full  Pt Overview and Major Events to Date:  7/19: Admitted to hospital from ED  Assessment and Plan: Rachel [name] is a 54 y.o. female presenting with shortness of breath. PMH is significant for pulmonary sarcoidosis, anxiety, hypertension, pulmonary hypertension, cardiomyopathy, CHF, GERD, SVT, MDD.   #Dyspnea: CXR showed evidence of chronic interstitial lung disease. Had an increased O2 requirement of 7L from baseline of 4-5L. ponse. Dyspnea improved with O2. Continue home dosing of inhaled breathing treatments. Do to chest x-ray and a lack of fever the likelihood of pneumonia is not high. But we'll continue to monitor her vitals, and possible followup chest x-ray if symptoms begin to change. Admitted and treated in April with similar symptoms. Unsure if symptoms are an exacerbation of underlying sarcoid or related to CHF or infection.  - ??Pulmonology consult: (previously seen by Dr. Lamonte Sakai; paged; no response) - Holding any Abx  - Continue Duoneb, Dulera, Iloprost (home medication)  - Continue supplemental O2.  - Begin prednisone 10 mg  - Followup EKG>>no change from previous; Troponin neg x3; TSH wnl - Venous Doppler of LLE : No evidence of DVT  #Diastolic CHF: most recent ECHO EF 60% (4/22), mild LVH, grade 1 diastolic dysfunction. Doesn't appear to be fluid overloaded but BNP elevated 661. No edema and no sign infection. Takes lasix 40 mg PO every other day  - cycle troponin>>neg x3 - TSH: wnl - Lasix 40 mg IV for two doses; re-evaluate after output and respiratory status.  - Continuing home Coreg regimen  - Monitor in/out  - Daily weight    #Stage IV Sarcoidosis: End stage. On home 4L to 6L O2. has been evaluated  at [facility] for heart/lung transplant but doesn't have resources.  - is intolerant (psychosis) to high dose prednisone -- Started on 84m PO - prednisone 161mdaily   #Cardiomyopathy: Stable as seen on CXR:   #Pulmonary hypertension w/ RV failure: Moderate to severe, likely secondary to sarcoidosis and parenchymal lung disease/hypoxic pulmonary vasoconstriction.  - continued home sildenafil and Iloprost.   #Supraventricular tachycardia w/ St. Jude ICD: History of during her last admission. Controlled upon admission.  - coreg 6.2538m- the tachy therapies have been turned off.   # Leg Pain; suspect neuropathic etiology. As noted to have significant left-sided leg pain and tenderness running toe to hip. Patient states that this pain is throbbing in nature. She is also experiencing muscular spasms resulting in spastic plantarflexion. She reports no weakness or radiating back pain. No reports of bowel or urinary incontinence. Reviewing previous notes patient seems to have experienced similar pain in this left leg. - Venous duplex performed; no evidence of DVT superficial thrombosis or Baker's cyst noted - Lumbar MRI: unable due to pacer.  - Considering other imaging options.   #GERD:  pantaprazole daily.   #Hypertension: home regimen  - coreg 6.25 BID   #Mood disorder: Has a history of depression and anxiety. Stable and reports no problems upon questioning in ED.  - continued patient's risperidone at night   FEN/GI: Heart Healthy PPx: SQ Heparin  Disposition: Home Healthcare prior to admission  Subjective:  Patient reports that her breathing is significantly improved today. She is back at you baseline O2 requirement. Today her main concern continues  to be with her left lower extremity. Left lower extremity appears to be painful both laterally and medially from toe to hip. Continues to experience muscle spasms of her distal left leg resulting in plantarflexion. She reports Lt UE weakness  today. No changes in bowel or bladder.  Objective: Temp:  [97.4 F (36.3 C)-98.7 F (37.1 C)] 98.7 F (37.1 C) (07/21 8270) Pulse Rate:  [72-85] 81 (07/21 0632) Resp:  [16-20] 18 (07/21 7867) BP: (113-124)/(61-94) 124/73 mmHg (07/21 0632) SpO2:  [93 %-99 %] 99 % (07/21 0632) FiO2 (%):  [44 %] 44 % (07/20 1523) Weight:  [160 lb 14.4 oz (72.984 kg)] 160 lb 14.4 oz (72.984 kg) (07/21 5449) Physical Exam: General -- oriented x3, pleasant and cooperative.  HEENT -- Head is normocephalic. Eyes, pupils equal and round and react to light and accommodation. Left eye appears slightly erythematous, with specific irritation noted in the 9-12 o'clock region. Exam difficult due to patient non-compliance. Extraocular motions are intact. Ears, nose and throat were benign.  Neck -- supple; no JVD  Integument -- intact. No rash, erythema, or ecchymoses.  Chest -- good expansion. Lungs with bilateral "crackles" (velcrow) throughout.  Cardiac -- RRR. No murmurs noted.  Abdomen -- soft, mild tenderness in bilateral groin and epigastrium. No masses palpable. Normal bowel sounds present.  CNS -- no gross deficits. Decreased sensation noted at distal LLE throughout. Patient noted some "cramping pain" along LLE as well.  Musculoskeletal - no effusions noted. Left lower leg is reported to be tender medially and laterally. Intermittent muscle spasms resulting in plantarflexion noted. Babinski positive on left side. Dorsalis pedis pulses present and symmetrical.    Laboratory:  Recent Labs Lab 10/24/13 1024 10/25/13 1023  WBC 8.7 8.1  HGB 10.8* 11.6*  HCT 34.8* 36.4  PLT 277 288    Recent Labs Lab 10/24/13 1024 10/25/13 0909 10/25/13 1023  NA 143  --  141  K 4.2  --  3.9  CL 98  --  98  CO2 30  --  29  BUN 14  --  12  CREATININE 0.65  --  0.70  CALCIUM 9.1  --  9.5  GLUCOSE 87 138* 88    TSH wnl Troponin negx3  Imaging/Diagnostic Tests: CXR (7/19) IMPRESSION:  Stable cardiomegaly and  severe chronic interstitial lung disease. No  acute findings.  Vascular Ultrasound  Left lower extremity venous duplex has been completed. Preliminary findings: Left: No evidence of DVT, superficial thrombosis, or Baker's cyst.   Elberta Leatherwood, MD 10/26/2013, 8:20 AM PGY-1, [place] Intern pager: [phone], text pages welcome

## 2013-10-26 NOTE — Progress Notes (Signed)
FMTS Attending Daily Note:  Annabell Sabal MD  [phone] pager  Family Practice pager:  [phone] I have seen and examined this patient and have reviewed their chart. I have discussed this patient with the resident. I agree with the resident's findings, assessment and care plan.  Additionally:  - 2 issues: 1) Pulm:  Much improved.  Would chalk this up mostly to sarcoidosis -- resolving with low dose steroids.  Will FU with pulm, on outpt basis due to vast improvement 2) Neuro:  Gross hyperreflexia LLE (clonus +4, DTRs +4) as well as baseline tremor on Left.  Patient reports eye pain, vision changes, Left facial paresthesias, and RUE weakness paresthesias for past few months.  Obtain MRI of spine to look for upper motor neuron lesion.  MS much less likely but would need to rule out with eye pain.   Alveda Reasons, MD 10/26/2013 1:51 PM

## 2013-10-26 NOTE — Clinical Documentation Improvement (Signed)
Possible Clinical Conditions?  Acute Respiratory Failure Acute on Chronic Respiratory Failure Chronic Respiratory Failure   Other Condition Cannot Clinically Determine   Supporting Information:Dyspnea: CXR showed evidence of chronic interstitial lung disease. Had an increased O2 requirement of 7L from baseline of 4-5L. ponse. Dyspnea improved with O2. Continue home dosing of inhaled breathing treatments. Do to chest x-ray and a lack of fever the likelihood of pneumonia is not high. But we'll continue to monitor her vitals, and possible followup chest x-ray if symptoms begin to change. Admitted and treated in April with similar symptoms. Unsure if symptoms are an exacerbation of underlying sarcoid or related to CHF or infection.  - ??Pulmonology consult: considering (previously seen by Dr. Lamonte Sakai  - Holding any Abx  - Continue Duoneb, Dulera, Iloprost (home medication)  - Continue supplemental O2.  - Begin prednisone 10 mg  - Followup EKG>>no change from previous; Troponin neg x3; TSH wnl  - Venous Doppler of LLE ordered   Risk Factors:Stage IV Sarcoidosis: End stage. On home 4L to 6L O2.    Thank You, Philippa Chester ,RN Clinical Documentation Specialist:  [place] Information Management

## 2013-10-26 NOTE — Consult Note (Signed)
Reason for Consult:Left leg pain and tremors Referring Physician: Chambliss  CC: Left leg pain and tremors  HPI: Rachel [name] is an 53 y.o. female who reports that for the past 3-4 months she has had left thigh pain.  Etiology has not been determined.  On the 18th the patient noted tremors in the thigh as well.  She reports that periodically during the day she will have a tingling sensation that starts on her foot on the left and travels up her leg to the groin.  The tingling travels to the fingertips on the left and at times on the right as well and then to the right jaw.  At the same time her left thigh will begin to tremor.  Episodes last a few minutes and then will resolve on their own.  Within a few minutes it will recur.  This has continued in this fashion since Saturday.  Patient reports that she has remained ambulatory. Paxil and Abilify were started about 3 weeks ago.  Past Medical History  Diagnosis Date  . GERD (gastroesophageal reflux disease)   . Hypertension   . 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 CHF (congestive heart failure)     Right side- Dr. Aundra Dubin ([place])  . Pulmonary HTN   . Anxiety   . Cardiomyopathy   . NSVT (nonsustained ventricular tachycardia)     with syncope: St Jude ICD was implanted. Device now nearing ERI. Given end stage lung disease and normalization of LV function, the device will not be replaced and tachy therapies were turned off  . Chronic chest pain     LHC (08/2008) with no angiographic coronary diease  . Allergic rhinitis   . H/O: iron deficiency anemia   . Secondary amenorrhea     irregular menses  . Psychosis     with high dose steroids  . SVT (supraventricular tachycardia)     possible runs  . Hypokalemia     recurrent  . Rotator cuff sprain   . Chronic back pain     Past Surgical History  Procedure Laterality Date  . Cardiac catheterization  08/26/2008    5% EF with normal coronary arteries and normal wall motion  . Adenosine  cardiolyte  03/31/2003    no ischemia/infarct; marked global LV hypekinesis; resting EF 22%  . Cardiac catheterization  03/31/2003    globally depressed LV fxn with EF 20%; idiopathic dilated cardiomyopathy  . Defibrillator placed  03/31/2003    St. Jude single chamber (Dr. Cristopher Peru)  . Pfts      FVC 2000 (56%) FEV1 1400 (47%), ratio 0.7; insignif response to bronchodilatior; stable from 1/05 - 9/05; PFTs: Mod restriction, FVC 1.9L, FEV1 1.6L, both 53% predicted; slt response to brochodilators 04/08/2002    Family History  Problem Relation Age of Onset  . Lung cancer Father   . Cancer Father     lung cancer  . Hypertension    . Diabetes Mother     border line  . Hypertension Mother   . Heart failure Mother     Social History:  reports that she quit smoking about 21 years ago. Her smoking use included Cigarettes. She has a 16.5 pack-year smoking history. She has never used smokeless tobacco. She reports that she drinks alcohol. She reports that she does not use illicit drugs.  Allergies  Allergen Reactions  . Latex Rash  . Nitroglycerin Other (See Comments)    Patient is on Revatio  .  Ace Inhibitors     unknown  . Meperidine Hcl Other (See Comments)    REACTION: Makes her feel funny  . Other Other (See Comments)    High Dose Steroids cause chemical imbalance and altered mental status  . Tramadol Hcl Other (See Comments)    REACTION: nausea, lightheadedness, sleepiness, and dizziness  . Propoxyphene N-Acetaminophen Rash    REACTION: rash: pruritus    Medications:  I have reviewed the patient's current medications. Prior to Admission:  Prescriptions prior to admission  Medication Sig Dispense Refill  . albuterol (PROAIR HFA) 108 (90 BASE) MCG/ACT inhaler Inhale 2 puffs into the lungs 4 (four) times daily.  18 g  11  . ARIPiprazole (ABILIFY) 2 MG tablet Take 1 tablet (2 mg total) by mouth daily.  30 tablet  1  . aspirin EC 81 MG tablet Take 1 tablet (81 mg total) by  mouth daily.  1 tablet  0  . budesonide-formoterol (SYMBICORT) 160-4.5 MCG/ACT inhaler Inhale 1 puff into the lungs 2 (two) times daily.      . carvedilol (COREG) 6.25 MG tablet Take 1 tablet (6.25 mg total) by mouth 2 (two) times daily with a meal.  60 tablet  0  . esomeprazole (NEXIUM) 40 MG capsule Take 40 mg by mouth every morning.      . furosemide (LASIX) 40 MG tablet Take 1 tablet (40 mg total) by mouth every other day.  45 tablet  3  . Iloprost (VENTAVIS) 20 MCG/ML SOLN Inhale 20 mcg into the lungs 6 (six) times daily.      Marland Kitchen PARoxetine (PAXIL) 20 MG tablet Take 1 tablet (20 mg total) by mouth daily.  30 tablet  1  . potassium chloride SA (K-DUR,KLOR-CON) 20 MEQ tablet Take 20-40 mEq by mouth 2 (two) times daily. 40 meq in the morning and 20 meq at night      . sildenafil (REVATIO) 20 MG tablet Take 4 tablets (80 mg total) by mouth 3 (three) times daily. take 4  tablets every 8 hours  10 tablet  0  . Treprostinil (TYVASO) 0.6 MG/ML SOLN Inhale 18 mcg into the lungs 4 (four) times daily.  3.6 mL  11   Scheduled: . ARIPiprazole  2 mg Oral Daily  . aspirin EC  81 mg Oral Daily  . budesonide-formoterol  1 puff Inhalation BID  . carvedilol  6.25 mg Oral BID WC  . heparin  5,000 Units Subcutaneous 3 times per day  . Iloprost  20 mcg Inhalation 6 times per day  . ipratropium-albuterol  3 mL Nebulization QID  . pantoprazole  80 mg Oral Q1200  . PARoxetine  20 mg Oral Daily  . predniSONE  10 mg Oral Q breakfast  . sildenafil  80 mg Oral TID  . sodium chloride  3 mL Intravenous Q12H  . sodium chloride  3 mL Intravenous Q12H    ROS: History obtained from the patient  General ROS: negative for - chills, fatigue, fever, night sweats, weight gain or weight loss Psychological ROS: negative for - behavioral disorder, hallucinations, memory difficulties, mood swings or suicidal ideation Ophthalmic ROS: negative for - blurry vision, double vision, eye pain or loss of vision ENT ROS: negative  for - epistaxis, nasal discharge, oral lesions, sore throat, tinnitus or vertigo Allergy and Immunology ROS: negative for - hives or itchy/watery eyes Hematological and Lymphatic ROS: negative for - bleeding problems, bruising or swollen lymph nodes Endocrine ROS: negative for - galactorrhea, hair pattern changes, polydipsia/polyuria  or temperature intolerance Respiratory ROS: shortness of breath  Cardiovascular ROS: negative for - chest pain, dyspnea on exertion, edema or irregular heartbeat Gastrointestinal ROS: negative for - abdominal pain, diarrhea, hematemesis, nausea/vomiting or stool incontinence Genito-Urinary ROS: negative for - dysuria, hematuria, incontinence or urinary frequency/urgency Musculoskeletal ROS: as noted in HPI Neurological ROS: as noted in HPI Dermatological ROS: negative for rash and skin lesion changes  Physical Examination: Blood pressure 120/72, pulse 80, temperature 98.4 F (36.9 C), temperature source Oral, resp. rate 19, height _0  (1.6 m), weight 72.984 kg (160 lb 14.4 oz), SpO2 97.00%.  Neurologic Examination Mental Status: Alert, oriented, thought content appropriate.  Speech fluent without evidence of aphasia.  Able to follow 3 step commands without difficulty. Cranial Nerves: II: Discs flat bilaterally; Visual fields grossly normal, pupils equal, round, reactive to light and accommodation III,IV, VI: ptosis not present, extra-ocular motions intact bilaterally V,VII: smile symmetric, facial light touch sensation normal bilaterally VIII: hearing normal bilaterally IX,X: gag reflex present XI: bilateral shoulder shrug XII: midline tongue extension Motor: Right : Upper extremity   5/5    Left:     Upper extremity   5/5  Lower extremity   5/5     Lower extremity   3-/5 hip flexor weakness (limited by pain) with 5/5 strength otherwise Tone and bulk:normal tone throughout; no atrophy noted Intermittent left thigh tremors that do not stop with  manipulation.   Sensory: Pinprick and light touch intact throughout, bilaterally Deep Tendon Reflexes: 2+ and symmetric throughout Plantars: Right: downgoing   Left: downgoing Cerebellar: normal finger-to-nose and normal heel-to-shin testing Gait: Unable to test CV: pulses palpable throughout     Laboratory Studies:   Basic Metabolic Panel:  Recent Labs Lab 10/24/13 1024 10/25/13 0909 10/25/13 1023  NA 143  --  141  K 4.2  --  3.9  CL 98  --  98  CO2 30  --  29  GLUCOSE 87 138* 88  BUN 14  --  12  CREATININE 0.65  --  0.70  CALCIUM 9.1  --  9.5    Liver Function Tests: No results found for this basename: AST, ALT, ALKPHOS, BILITOT, PROT, ALBUMIN,  in the last 168 hours No results found for this basename: LIPASE, AMYLASE,  in the last 168 hours No results found for this basename: AMMONIA,  in the last 168 hours  CBC:  Recent Labs Lab 10/24/13 1024 10/25/13 1023  WBC 8.7 8.1  NEUTROABS 6.7  --   HGB 10.8* 11.6*  HCT 34.8* 36.4  MCV 91.3 91.0  PLT 277 288    Cardiac Enzymes:  Recent Labs Lab 10/24/13 1820 10/24/13 2325 10/25/13 0320  TROPONINI <0.30 <0.30 <0.30    BNP: No components found with this basename: POCBNP,   CBG: No results found for this basename: GLUCAP,  in the last 168 hours  Microbiology: Results for orders placed during the hospital encounter of 09/20/13  CULTURE, BLOOD (ROUTINE X 2)     Status: None   Collection Time    09/20/13  4:30 AM      Result Value Ref Range Status   Specimen Description BLOOD LEFT HAND IV START   Final   Special Requests BOTTLES DRAWN AEROBIC AND ANAEROBIC 10CC EACH   Final   Culture  Setup Time     Final   Value: 09/20/2013 08:47     Performed at [facility]   Culture     Final   Value: NO  GROWTH 5 DAYS     Performed at [facility]   Report Status 09/26/2013 FINAL   Final  CULTURE, BLOOD (ROUTINE X 2)     Status: None   Collection Time    09/20/13  5:15 AM      Result Value Ref  Range Status   Specimen Description BLOOD RIGHT HAND   Final   Special Requests BOTTLES DRAWN AEROBIC AND ANAEROBIC [facility] EACH   Final   Culture  Setup Time     Final   Value: 09/20/2013 08:46     Performed at [facility]   Culture     Final   Value: NO GROWTH 5 DAYS     Performed at [facility]   Report Status 09/26/2013 FINAL   Final    Coagulation Studies: No results found for this basename: LABPROT, INR,  in the last 72 hours  Urinalysis: No results found for this basename: COLORURINE, APPERANCEUR, LABSPEC, PHURINE, GLUCOSEU, HGBUR, BILIRUBINUR, KETONESUR, PROTEINUR, UROBILINOGEN, NITRITE, LEUKOCYTESUR,  in the last 168 hours  Lipid Panel:     Component Value Date/Time   CHOL 192 01/04/2013 1437   TRIG 77.0 01/04/2013 1437   HDL 99.50 01/04/2013 1437   CHOLHDL 2 01/04/2013 1437   VLDL 15.4 01/04/2013 1437   LDLCALC 77 01/04/2013 1437    HgbA1C:  No results found for this basename: HGBA1C    Urine Drug Screen:     Component Value Date/Time   LABOPIA NONE DETECTED 08/27/2013 0427   COCAINSCRNUR NONE DETECTED 08/27/2013 0427   LABBENZ NONE DETECTED 08/27/2013 0427   AMPHETMU NONE DETECTED 08/27/2013 0427   THCU NONE DETECTED 08/27/2013 0427   LABBARB NONE DETECTED 08/27/2013 0427    Alcohol Level: No results found for this basename: ETH,  in the last 168 hours  Other results: EKG: normal sinus rhythm at 87 bpm, 1st degree AV block.  Imaging: Ct Head Wo Contrast  10/26/2013   CLINICAL DATA:  Left orbital pain.  Left lower extremity clonus.  EXAM: CT HEAD WITHOUT CONTRAST  TECHNIQUE: Contiguous axial images were obtained from the base of the skull through the vertex without intravenous contrast.  COMPARISON:  05/29/2008  FINDINGS: The brainstem, cerebellum, cerebral peduncles, thalamus, basal ganglia, basilar cisterns, and ventricular system appear within normal limits. No intracranial hemorrhage, mass lesion, or acute CVA. Visualized paranasal sinuses clear.  No  abnormality of the left orbit is identified on today's head CT ; a cover most of the orbit on the CT scan although a dedicated orbital exam was not performed.  IMPRESSION: 1.  No significant abnormality identified.   Electronically Signed   By: Sherryl Barters M.D.   On: 10/26/2013 17:23   Ct Lumbar Spine Wo Contrast  10/26/2013   CLINICAL DATA:  Left lower extremity clonus  EXAM: CT LUMBAR SPINE WITHOUT CONTRAST  TECHNIQUE: Multidetector CT imaging of the lumbar spine was performed without intravenous contrast administration. Multiplanar CT image reconstructions were also generated.  COMPARISON:  None.  FINDINGS: Minimal L1-2 disc bulge.  Mild L2-3 disc bones.  At L3-4, there is a mild disc bulge. There is mild facet hypertrophy. There is mild to moderate canal narrowing. Foramina are patent.  At L4-5 there is mild facet hypertrophy. There is moderate disc bulge. There is moderate canal narrowing. There is mild bilateral foraminal.  At L5-S1, there is mild facet hypertrophy. There is a mild disc bulge. There is mild canal narrowing. There is mild bilateral foraminal narrowing.  There is normal alignment. No focal osseous lesions acute nature identified.  IMPRESSION: Degenerative change with multilevel canal and foraminal narrowing.   Electronically Signed   By: Skipper Cliche M.D.   On: 10/26/2013 18:00     Assessment/Plan: 53 year old female with intermittent paresthesias and left thigh tremors.  Etiology unclear.  DVT has been ruled out with ultrasound.  TSH normal.  Patient has had a CT of the head and lumbar spine that I have reviewed and show no abnormalities to explain presentation.  Other possibilities include simple partial seizures, metabolic abnormalities, medications effects.    Recommendations: 1.  EEG 2.  Magnesium, phosphorus, heavy metal screen, ESR, B12, CK 3.  If no etiology identified would consider a change in antipsychotic medications and/or muscle biopsy.     Alexis Goodell,  MD [facility] [phone] 10/26/2013, 9:57 PM

## 2013-10-27 ENCOUNTER — Other Ambulatory Visit: Payer: Self-pay

## 2013-10-27 ENCOUNTER — Inpatient Hospital Stay (HOSPITAL_COMMUNITY): Payer: Medicare Other

## 2013-10-27 DIAGNOSIS — R259 Unspecified abnormal involuntary movements: Secondary | ICD-10-CM

## 2013-10-27 DIAGNOSIS — M79609 Pain in unspecified limb: Secondary | ICD-10-CM

## 2013-10-27 DIAGNOSIS — M48061 Spinal stenosis, lumbar region without neurogenic claudication: Secondary | ICD-10-CM

## 2013-10-27 LAB — MAGNESIUM: MAGNESIUM: 2.1 mg/dL (ref 1.5–2.5)

## 2013-10-27 LAB — CK: Total CK: 44 U/L (ref 7–177)

## 2013-10-27 LAB — PHOSPHORUS: Phosphorus: 3.1 mg/dL (ref 2.3–4.6)

## 2013-10-27 LAB — SEDIMENTATION RATE: Sed Rate: 54 mm/hr — ABNORMAL HIGH (ref 0–22)

## 2013-10-27 LAB — VITAMIN B12: VITAMIN B 12: 367 pg/mL (ref 211–911)

## 2013-10-27 MED ORDER — IPRATROPIUM-ALBUTEROL 0.5-2.5 (3) MG/3ML IN SOLN
3.0000 mL | Freq: Three times a day (TID) | RESPIRATORY_TRACT | Status: DC
  Administered 2013-10-27: 3 mL via RESPIRATORY_TRACT
  Filled 2013-10-27 (×2): qty 3

## 2013-10-27 MED ORDER — ILOPROST 20 MCG/ML IN SOLN
20.0000 ug | Freq: Every day | RESPIRATORY_TRACT | Status: DC
Start: 2013-10-27 — End: 2013-11-10

## 2013-10-27 NOTE — Progress Notes (Signed)
Patient evaluated for community based chronic disease management services with [facility] as a benefit of patient's [employer]. Spoke with patient at bedside to explain [facility] services.  Patient will receive a post discharge transition of care call and will be evaluated for monthly home visits for assessments and disease process education and the need for transportation assistance.  Left contact information and [facility] literature at bedside. Made Inpatient Case Manager aware that [facility] following. Of note, [facility] Care Management services does not replace or interfere with any services that are arranged by inpatient case management or social work.  For additional questions or referrals please contact Corliss Blacker BSN RN [facility] Liaison at [phone].

## 2013-10-27 NOTE — Progress Notes (Signed)
Subjective: Patient continues to have left hip discomfort and left leg jerking.  Noted if told to relax the jerking will stop.   Objective: Current vital signs: BP 125/46  Pulse 79  Temp(Src) 98.6 F (37 C) (Oral)  Resp 20  Ht _0  (1.6 m)  Wt 73.528 kg (162 lb 1.6 oz)  BMI 28.72 kg/m2  SpO2 96% Vital signs in last 24 hours: Temp:  [97.4 F (36.3 C)-98.6 F (37 C)] 98.6 F (37 C) (07/22 1610) Pulse Rate:  [73-86] 79 (07/22 1400) Resp:  [20] 20 (07/22 1400) BP: (117-133)/(46-63) 125/46 mmHg (07/22 1400) SpO2:  [93 %-96 %] 96 % (07/22 1422) Weight:  [73.528 kg (162 lb 1.6 oz)] 73.528 kg (162 lb 1.6 oz) (07/22 0619)  Intake/Output from previous day: 07/21 0701 - 07/22 0700 In: 1083 [P.O.:1080; I.V.:3] Out: 1325 [Urine:1325] Intake/Output this shift: Total I/O In: 240 [P.O.:240] Out: 125 [Urine:125] Nutritional status: Cardiac  Neurologic Exam: Neurologic Examination  Mental Status:  Alert, oriented, thought content appropriate. Speech fluent without evidence of aphasia. Able to follow 3 step commands without difficulty.  Cranial Nerves:  II: Discs flat bilaterally; Visual fields grossly normal, pupils equal, round, reactive to light and accommodation  III,IV, VI: ptosis not present, extra-ocular motions intact bilaterally  V,VII: smile symmetric, facial light touch sensation normal bilaterally  VIII: hearing normal bilaterally  IX,X: gag reflex present  XI: bilateral shoulder shrug  XII: midline tongue extension  Motor:  Right :  Upper extremity 5/5  Left:  Upper extremity 5/5   Lower extremity 5/5   Lower extremity 3-/5 hip flexor weakness (limited by pain) with 5/5 strength otherwise --with significant give way weakness when formally tested.  Tone and bulk:normal tone throughout; no atrophy noted  Intermittent left thigh tremors that do not stop with manipulation.  Sensory: Pinprick and light touch intact throughout, bilaterally  Deep Tendon Reflexes: 2+ and  symmetric throughout  Plantars:  Right: downgoing   Left: downgoing  Cerebellar:  normal finger-to-nose and normal heel-to-shin testing  Gait: Unable to test  CV: pulses palpable throughout      Lab Results: Basic Metabolic Panel:  Recent Labs Lab 10/24/13 1024 10/25/13 0909 10/25/13 1023 10/26/13 2347  NA 143  --  141  --   K 4.2  --  3.9  --   CL 98  --  98  --   CO2 30  --  29  --   GLUCOSE 87 138* 88  --   BUN 14  --  12  --   CREATININE 0.65  --  0.70  --   CALCIUM 9.1  --  9.5  --   MG  --   --   --  2.1  PHOS  --   --   --  3.1    Liver Function Tests: No results found for this basename: AST, ALT, ALKPHOS, BILITOT, PROT, ALBUMIN,  in the last 168 hours No results found for this basename: LIPASE, AMYLASE,  in the last 168 hours No results found for this basename: AMMONIA,  in the last 168 hours  CBC:  Recent Labs Lab 10/24/13 1024 10/25/13 1023  WBC 8.7 8.1  NEUTROABS 6.7  --   HGB 10.8* 11.6*  HCT 34.8* 36.4  MCV 91.3 91.0  PLT 277 288    Cardiac Enzymes:  Recent Labs Lab 10/24/13 1820 10/24/13 2325 10/25/13 0320 10/26/13 2347  CKTOTAL  --   --   --  52  TROPONINI <0.30 <  0.30 <0.30  --     Lipid Panel: No results found for this basename: CHOL, TRIG, HDL, CHOLHDL, VLDL, LDLCALC,  in the last 168 hours  CBG: No results found for this basename: GLUCAP,  in the last 168 hours  Microbiology: Results for orders placed during the hospital encounter of 09/20/13  CULTURE, BLOOD (ROUTINE X 2)     Status: None   Collection Time    09/20/13  4:30 AM      Result Value Ref Range Status   Specimen Description BLOOD LEFT HAND IV START   Final   Special Requests BOTTLES DRAWN AEROBIC AND ANAEROBIC 10CC EACH   Final   Culture  Setup Time     Final   Value: 09/20/2013 08:47     Performed at [facility]   Culture     Final   Value: NO GROWTH 5 DAYS     Performed at [facility]   Report Status 09/26/2013 FINAL   Final  CULTURE,  BLOOD (ROUTINE X 2)     Status: None   Collection Time    09/20/13  5:15 AM      Result Value Ref Range Status   Specimen Description BLOOD RIGHT HAND   Final   Special Requests BOTTLES DRAWN AEROBIC AND ANAEROBIC [place]   Final   Culture  Setup Time     Final   Value: 09/20/2013 08:46     Performed at [facility]   Culture     Final   Value: NO GROWTH 5 DAYS     Performed at [facility]   Report Status 09/26/2013 FINAL   Final    Coagulation Studies: No results found for this basename: LABPROT, INR,  in the last 72 hours  Imaging: Ct Head Wo Contrast  10/26/2013   CLINICAL DATA:  Left orbital pain.  Left lower extremity clonus.  EXAM: CT HEAD WITHOUT CONTRAST  TECHNIQUE: Contiguous axial images were obtained from the base of the skull through the vertex without intravenous contrast.  COMPARISON:  05/29/2008  FINDINGS: The brainstem, cerebellum, cerebral peduncles, thalamus, basal ganglia, basilar cisterns, and ventricular system appear within normal limits. No intracranial hemorrhage, mass lesion, or acute CVA. Visualized paranasal sinuses clear.  No abnormality of the left orbit is identified on today's head CT ; a cover most of the orbit on the CT scan although a dedicated orbital exam was not performed.  IMPRESSION: 1.  No significant abnormality identified.   Electronically Signed   By: Sherryl Barters M.D.   On: 10/26/2013 17:23   Ct Lumbar Spine Wo Contrast  10/26/2013   CLINICAL DATA:  Left lower extremity clonus  EXAM: CT LUMBAR SPINE WITHOUT CONTRAST  TECHNIQUE: Multidetector CT imaging of the lumbar spine was performed without intravenous contrast administration. Multiplanar CT image reconstructions were also generated.  COMPARISON:  None.  FINDINGS: Minimal L1-2 disc bulge.  Mild L2-3 disc bones.  At L3-4, there is a mild disc bulge. There is mild facet hypertrophy. There is mild to moderate canal narrowing. Foramina are patent.  At L4-5 there is mild facet  hypertrophy. There is moderate disc bulge. There is moderate canal narrowing. There is mild bilateral foraminal.  At L5-S1, there is mild facet hypertrophy. There is a mild disc bulge. There is mild canal narrowing. There is mild bilateral foraminal narrowing.  There is normal alignment. No focal osseous lesions acute nature identified.  IMPRESSION: Degenerative change with multilevel canal and foraminal  narrowing.   Electronically Signed   By: Skipper Cliche M.D.   On: 10/26/2013 18:00    Medications:  Scheduled: . ARIPiprazole  2 mg Oral Daily  . aspirin EC  81 mg Oral Daily  . budesonide-formoterol  1 puff Inhalation BID  . carvedilol  6.25 mg Oral BID WC  . heparin  5,000 Units Subcutaneous 3 times per day  . Iloprost  20 mcg Inhalation 6 times per day  . ipratropium-albuterol  3 mL Nebulization TID  . pantoprazole  80 mg Oral Q1200  . PARoxetine  20 mg Oral Daily  . predniSONE  10 mg Oral Q breakfast  . sildenafil  80 mg Oral TID  . sodium chloride  3 mL Intravenous Q12H  . sodium chloride  3 mL Intravenous Q12H    Assessment/Plan:  53 year old female with intermittent paresthesias and left thigh tremors. Etiology unclear. Mag, Phos, TSH, B12 and CK all normal.  ESR slightly elevated but likely secondary to Sarcoidosis. Heavy metal screen pending.  Patient has had a CT of the head and lumbar spine that I have reviewed and show no abnormalities to explain presentation. EEG obtained shows no epileptiform activity.   Recommend: 1) follow heavy metal screen--may be done as out patient.  2) If no etiology identified would consider a change in antipsychotic medications and/or muscle biopsy.  No further neurologic intervention is recommended at this time.  If further questions arise, please call or page at that time.  Thank you for allowing neurology to participate in the care of this patient.  Etta Quill PA-C [facility] [phone]  10/27/2013, 5:11 PM

## 2013-10-27 NOTE — Progress Notes (Signed)
[facility] Attending Daily Note:  Annabell Sabal MD  [phone] pager  Family Practice pager:  [phone] I have seen and examined this patient and have reviewed their chart. I have discussed this patient with the resident. I agree with the resident's findings, assessment and care plan.  Additionally:  - Breathing now completely back to baseline - Awaiting results of EEG. - If negative, favor EMS over muscle biopsy.  ESR elevated but likely sarcoidosis.  - Psychogenic cause another possibility.  Can be DC'ed home with outpatient workup for LE tremor.   Alveda Reasons, MD 10/27/2013 1:41 PM

## 2013-10-27 NOTE — Progress Notes (Signed)
Family Medicine Teaching Service Daily Progress Note Intern Pager: [phone]  Patient name: Rachel [name] Medical record number: [id number] Date of birth: 1960-05-08 Age: 53 y.o. Gender: female  Primary Care Provider: Christa See, MD Consultants: None Code Status: Full  Pt Overview and Major Events to Date:  7/19: Admitted to hospital from ED  Assessment and Plan: Rachel [name] is a 53 y.o. female presenting with shortness of breath. PMH is significant for pulmonary sarcoidosis, anxiety, hypertension, pulmonary hypertension, cardiomyopathy, CHF, GERD, SVT, MDD.   #Dyspnea: CXR showed evidence of chronic interstitial lung disease. Had an increased O2 requirement of 7L from baseline of 4-5L. ponse. Dyspnea improved with O2. Continue home dosing of inhaled breathing treatments. Do to chest x-ray and a lack of fever the likelihood of pneumonia is not high. But we'll continue to monitor her vitals, and possible followup chest x-ray if symptoms begin to change. Admitted and treated in April with similar symptoms. Unsure if symptoms are an exacerbation of underlying sarcoid or related to CHF or infection.  - ??Pulmonology consult: (previously seen by Dr. Lamonte Sakai; paged; no response) - Holding any Abx  - Continue Duoneb, Dulera, Iloprost (home medication)  - Continue supplemental O2.  - Begin prednisone 10 mg  - Followup EKG>>no change from previous; Troponin neg x3; TSH wnl - Venous Doppler of LLE : No evidence of DVT  #Diastolic CHF: most recent ECHO EF 60% (4/22), mild LVH, grade 1 diastolic dysfunction. Doesn't appear to be fluid overloaded but BNP elevated 661. No edema and no sign infection. Takes lasix 40 mg PO every other day  - cycle troponin>>neg x3 - TSH: wnl - Lasix 40 mg IV for two doses; re-evaluate after output and respiratory status.  - Continuing home Coreg regimen  - Monitor in/out  - Daily weight    #Stage IV Sarcoidosis: End stage. On home 4L to 6L O2. has been evaluated  at [facility] for heart/lung transplant but doesn't have resources.  - is intolerant (psychosis) to high dose prednisone -- Started on 100m PO - prednisone 147mdaily   #Cardiomyopathy: Stable as seen on CXR:   #Pulmonary hypertension w/ RV failure: Moderate to severe, likely secondary to sarcoidosis and parenchymal lung disease/hypoxic pulmonary vasoconstriction.  - continued home sildenafil and Iloprost.   #Supraventricular tachycardia w/ St. Jude ICD: History of during her last admission. Controlled upon admission.  - coreg 6.2534m- the tachy therapies have been turned off.   # Leg Pain; suspect neuropathic etiology. As noted to have significant left-sided leg pain and tenderness running toe to hip. Patient states that this pain is throbbing in nature. She is also experiencing muscular spasms resulting in spastic plantarflexion. She reports no weakness or radiating back pain. No reports of bowel or urinary incontinence. Reviewing previous notes patient seems to have experienced similar pain in this left leg. - Venous duplex performed; no evidence of DVT superficial thrombosis or Baker's cyst noted - Lumbar MRI: unable due to pacer.  - Considering other imaging options.  - Neurology Consulted (thank you): ordered EEG (pending), Mg (2.1N), Phos (3.1), heavy metals (pending), ESR (54H), B12 (pending), CK (44N)  #GERD:  pantaprazole daily.   #Hypertension: home regimen  - coreg 6.25 BID   #Mood disorder: Has a history of depression and anxiety. Stable and reports no problems upon questioning in ED.  - continued patient's risperidone at night   FEN/GI: Heart Healthy PPx: SQ Heparin  Disposition: Home Healthcare prior to admission  Subjective:  Patient  has continued left leg pain. Patient states that her breathing is back to baseline today.  Objective: Temp:  [97.4 F (36.3 C)-98.6 F (37 C)] 98.6 F (37 C) (07/22 0619) Pulse Rate:  [73-86] 73 (07/22 0619) Resp:  [19-20] 20 (07/22  0619) BP: (117-133)/(59-72) 133/63 mmHg (07/22 0619) SpO2:  [93 %-99 %] 95 % (07/22 0619) Weight:  [162 lb 1.6 oz (73.528 kg)] 162 lb 1.6 oz (73.528 kg) (07/22 5198) Physical Exam: General -- oriented x3, pleasant and cooperative.  HEENT -- Head is normocephalic. Eyes, pupils equal and round and react to light and accommodation. Left eye appears slightly erythematous, with specific irritation noted in the 9-12 o'clock region. Exam difficult due to patient non-compliance. Extraocular motions are intact. Ears, nose and throat were benign.  Neck -- supple; no JVD  Integument -- intact. No rash, erythema, or ecchymoses.  Chest -- good expansion. Lungs with bilateral "crackles" (velcrow) throughout.  Cardiac -- RRR. No murmurs noted.  Abdomen -- soft, mild tenderness in bilateral groin and epigastrium. No masses palpable. Normal bowel sounds present.  CNS -- no gross deficits. Decreased sensation noted at distal LLE throughout. Patient noted some "cramping pain" along LLE as well.  Musculoskeletal - no effusions noted. Left lower leg is reported to be tender medially and laterally. Intermittent muscle spasms resulting in plantarflexion noted. Babinski positive on left side. Dorsalis pedis pulses present and symmetrical.    Laboratory:  Recent Labs Lab 10/24/13 1024 10/25/13 1023  WBC 8.7 8.1  HGB 10.8* 11.6*  HCT 34.8* 36.4  PLT 277 288    Recent Labs Lab 10/24/13 1024 10/25/13 0909 10/25/13 1023  NA 143  --  141  K 4.2  --  3.9  CL 98  --  98  CO2 30  --  29  BUN 14  --  12  CREATININE 0.65  --  0.70  CALCIUM 9.1  --  9.5  GLUCOSE 87 138* 88    TSH wnl Troponin negx3  Imaging/Diagnostic Tests: CXR (7/19) IMPRESSION:  Stable cardiomegaly and severe chronic interstitial lung disease. No  acute findings.  Vascular Ultrasound  Left lower extremity venous duplex has been completed. Preliminary findings: Left: No evidence of DVT, superficial thrombosis, or Baker's  cyst.  CT Head (7/21) IMPRESSION:  1. No significant abnormality identified.  CT Lumbar Spine (7/21) IMPRESSION:  Degenerative change with multilevel canal and foraminal narrowing.  EEG (7/21) PENDING    Elberta Leatherwood, MD 10/27/2013, 7:34 AM PGY-1, [place] Intern pager: [phone], text pages welcome

## 2013-10-27 NOTE — Procedures (Signed)
ELECTROENCEPHALOGRAM REPORT  Patient: Rachel [name]       Room #: 8Y42 EEG No. ID: [id number] Age: 53 y.o.        Sex: female Referring Physician: Chambliss Report Date:  10/27/2013        Interpreting Physician: Anthony Sar  History: Rachel [name] is an 53 y.o. female with history recurrent spells consisting of tingling starting in left lower extremity and spreading to left upper extremity and left and right jaw, as well as tremor involving left upper extremity.  Indications for study:  Rule out focal seizure disorder.  Technique: This is an 18 channel routine scalp EEG performed at the bedside with bipolar and monopolar montages arranged in accordance to the international 10/20 system of electrode placement.   Description: This EEG recording was performed during wakefulness. Predominant background activity consisted of 9-10 Hz symmetrical alpha rhythm which attenuates well with eye-opening. Photic stimulation produces a symmetrical occipital driving response. Hyperventilation was not performed. No epileptiform discharges were recorded. There was no abnormal slowing of cerebral activity. Interpretation: This is a normal EEG recording during wakefulness. No evidence of an epileptic disorder was demonstrated.   Rush Farmer M.D. [facility] [phone]

## 2013-10-27 NOTE — Progress Notes (Signed)
CSW called by RN to arrange EMS transport home today for patient. CSW met with patient- she stated that she does not have transportation- no one to come and pick her up and she requires continuous 02. CSW attempted to discuss other possible transport options but she was emphatic that she return home via EMS.  Transport request completed and patient's nurse notified. Patient was pleased with d/c arrangements.  Lorie Phenix. Pauline Good, [place]

## 2013-10-27 NOTE — Care Management Note (Signed)
    Page 1 of 1   10/27/2013     1:37:10 PM CARE MANAGEMENT NOTE 10/27/2013  Patient:  Rachel [name], Rachel [name]   Account Number:  [id number]  Date Initiated:  10/27/2013  Documentation initiated by:  [facility]  Subjective/Objective Assessment:   53 y.o. female presenting with SOB. PMH is significant for pulmonary sarcoidosis, anxiety, HTN, pulmonary hypertension, cardiomyopathy, CHF, GERD, SVT, MDD..//Home with daughter.     Action/Plan:   EEG performed at the bedside.//Assist with disposition needs.   Anticipated DC Date:  10/28/2013   Anticipated DC Plan:  [place]  CM consult      Choice offered to / List presented to:             Status of service:  Completed, signed off Medicare Important Message given?  YES (If response is "NO", the following Medicare IM given date fields will be blank) Date Medicare IM given:  10/27/2013 Medicare IM given by:  [facility] Date Additional Medicare IM given:   Additional Medicare IM given by:    Discharge Disposition:    Per UR Regulation:  Reviewed for med. necessity/level of care/duration of stay  If discussed at [place] of Stay Meetings, dates discussed:    Comments:  10/27/13 [place], RN, BSN, [place] [phone] 18 channel routine scalp EEG performed at the bedside with bipolar and monopolar montages arranged in accordance to the international 10/20 system of electrode placement. Interpretation: This is a normal EEG recording during wakefulness. No evidence of an epileptic disorder was demonstrated.

## 2013-10-27 NOTE — Progress Notes (Signed)
Routine adult EEG completed, results pending.

## 2013-10-28 ENCOUNTER — Other Ambulatory Visit (HOSPITAL_COMMUNITY): Payer: Self-pay

## 2013-10-28 ENCOUNTER — Ambulatory Visit: Payer: Self-pay | Admitting: Family Medicine

## 2013-10-30 NOTE — Discharge Summary (Signed)
[facility] Discharge Summary  Patient name: Rachel [name] Medical record number: [id number] Date of birth: 1961/01/29 Age: 53 y.o. Gender: female Date of Admission: 10/24/2013  Date of Discharge: 10/27/2013 Admitting Physician: Lind Covert, MD  Primary Care Provider: Christa See, MD Consultants: Neurology  Indication for Hospitalization: Shortness of breath and acute left leg pain/tremors  Discharge Diagnoses/Problem List:  Pulmonary sarcoidosis, exacerbation with dyspnea Pulmonary hypertension Diastolic CHF Stage IV sarcoidosis Supraventricular tachycardia with St. Jude ICD Leg pain; unknown etiology GERD Hypertension History of depression and anxiety   Disposition: Discharged home with plans to have [facility] care management services followup at home  Discharge Condition: Medically stable   Brief Hospital Course:  Patient noted for shortness of breath refractory to home breathing treatments. Shortness of breath continued throughout the morning she started to develop a headache and was diaphoretic. She also reports some chest pain with paresthesias radiating down her left arm. She reports nausea diarrhea and dry heaves. She noticed a productive cough with rusty colored sputum. She denied any recent changes in medications at that time. Patient had been admitted recently (April of this year) with similar symptoms.  Patient was admitted to telemetry. We continued her home regimen of Duoneb, Dulera, and Iloprost. Supplemental oxygen was provided and we administered a low dose prednisone burst (10 mg). Empiric antibiotics was held at that time due to patient past medical history. Lasix IV 40 mg x2 doses was provided for possible fluid overload. She was eventually placed back on her oral Lasix regimen.  Cycled troponins were negative x3, TSH was within normal limits, BNP elevated at 661.8.  Patient's breathing status quickly returned to baseline.  At this point the left leg pain she been experiencing on admission increased dramatically patient also noted that she had unilateral left-sided eye pain as well. Venous Doppler was obtained and was negative. She was noted to have a positive Babinski on this side. MRI of lumbar and brain were appropriate at this time however could not be obtained due to pacemaker present. Neurology was consulted. CT brain and lumbar spine were taken instead per neurology and were both negative for acute findings. magnesium phosphorus and vitamin B 12 levels were obtained and were within normal limits. An ESR was obtained and was slightly elevated, this is to be expected with her history of sarcoidosis. A heavy metals screen was obtained and is currently pending. An EEG was ordered and found no abnormalities.  At this time patient was considered to be medically stable for discharge. Etiology for her leg pain still unknown. Neurology recommended the consideration of changing antipsychotic medications and/or pursuing further workup with muscle biopsy.    Issues for Follow Up:  - Continued left leg pain with muscle spasms/clonus; moderate to severe -- heavy metal screen pending, consider muscle biopsy, consider antipsychotic medication change (per neurology) - Continued chronic disease management  Significant Procedures: None  Significant Labs and Imaging:   Recent Labs Lab 10/24/13 1024 10/25/13 1023  WBC 8.7 8.1  HGB 10.8* 11.6*  HCT 34.8* 36.4  PLT 277 288    Recent Labs Lab 10/24/13 1024 10/25/13 0909 10/25/13 1023 10/26/13 2347  NA 143  --  141  --   K 4.2  --  3.9  --   CL 98  --  98  --   CO2 30  --  29  --   GLUCOSE 87 138* 88  --   BUN 14  --  12  --  CREATININE 0.65  --  0.70  --   CALCIUM 9.1  --  9.5  --   MG  --   --   --  2.1  PHOS  --   --   --  3.1   Left leg venous Doppler 7/20 Summary: - No evidence of deep vein thrombosis involving the left lower extremity. - No evidence of  Baker&'s cyst on the left.  CT head 7/21 IMPRESSION:  1. No significant abnormality identified.  CT lumbar spine 7/21 IMPRESSION:  Degenerative change with multilevel canal and foraminal narrowing.  EEG 7/21 - shows no epileptiform activity.    Results/Tests Pending at Time of Discharge: Heavy metals screen taken on 10/26/2013  Discharge Medications:    Medication List         albuterol 108 (90 BASE) MCG/ACT inhaler  Commonly known as:  PROAIR HFA  Inhale 2 puffs into the lungs 4 (four) times daily.     ARIPiprazole 2 MG tablet  Commonly known as:  ABILIFY  Take 1 tablet (2 mg total) by mouth daily.     aspirin EC 81 MG tablet  Take 1 tablet (81 mg total) by mouth daily.     budesonide-formoterol 160-4.5 MCG/ACT inhaler  Commonly known as:  SYMBICORT  Inhale 1 puff into the lungs 2 (two) times daily.     carvedilol 6.25 MG tablet  Commonly known as:  COREG  Take 1 tablet (6.25 mg total) by mouth 2 (two) times daily with a meal.     esomeprazole 40 MG capsule  Commonly known as:  NEXIUM  Take 40 mg by mouth every morning.     furosemide 40 MG tablet  Commonly known as:  LASIX  Take 1 tablet (40 mg total) by mouth every other day.     Iloprost 20 MCG/ML Soln  Commonly known as:  VENTAVIS  Inhale 1 mL (20 mcg total) into the lungs 6 (six) times daily.     PARoxetine 20 MG tablet  Commonly known as:  PAXIL  Take 1 tablet (20 mg total) by mouth daily.     potassium chloride SA 20 MEQ tablet  Commonly known as:  K-DUR,KLOR-CON  Take 20-40 mEq by mouth 2 (two) times daily. 40 meq in the morning and 20 meq at night     sildenafil 20 MG tablet  Commonly known as:  REVATIO  Take 4 tablets (80 mg total) by mouth 3 (three) times daily. take 4  tablets every 8 hours     Treprostinil 0.6 MG/ML Soln  Commonly known as:  TYVASO  Inhale 18 mcg into the lungs 4 (four) times daily.        Discharge Instructions: Please refer to Patient Instructions section of EMR  for full details.  Patient was counseled important signs and symptoms that should prompt return to medical care, changes in medications, dietary instructions, activity restrictions, and follow up appointments.   Follow-Up Appointments:  11/01/2013 at 2:45 PM with Dr. Christa See [facility] family medicine     Elberta Leatherwood, MD 10/30/2013, 1:51 AM PGY-1, [place]

## 2013-10-31 NOTE — Discharge Summary (Signed)
Family Medicine Teaching Service  Discharge Note : Attending Annabell Sabal MD Pager [phone] Inpatient Team Pager:  [phone]  I have reviewed this patient and the patient's chart and have discussed discharge planning with the resident at the time of discharge. I agree with the discharge plan as above.  Only thing to add would be moving up FU with her pulmonologist as this seemed to be admission from exacerbation of sarcoidosis.  Improved with only 10 mg of Prednisone.

## 2013-11-01 ENCOUNTER — Ambulatory Visit (INDEPENDENT_AMBULATORY_CARE_PROVIDER_SITE_OTHER): Payer: Medicare Other | Admitting: Family Medicine

## 2013-11-01 ENCOUNTER — Encounter: Payer: Self-pay | Admitting: Family Medicine

## 2013-11-01 VITALS — BP 141/76 | HR 85 | Ht 63.0 in | Wt 165.0 lb

## 2013-11-01 DIAGNOSIS — F333 Major depressive disorder, recurrent, severe with psychotic symptoms: Secondary | ICD-10-CM

## 2013-11-01 DIAGNOSIS — D869 Sarcoidosis, unspecified: Secondary | ICD-10-CM

## 2013-11-01 MED ORDER — ARIPIPRAZOLE 2 MG PO TABS: 2.0000 mg | ORAL_TABLET | ORAL | Status: DC

## 2013-11-01 NOTE — Progress Notes (Signed)
   Subjective:    Patient ID: Rachel [name], female    DOB: 1960-07-10, 53 y.o.   MRN: [id number]  HPI: Pt presents to clinic for hospital follow-up. She states her SOB has greatly improved and she is done with a burst of steroids given in the hospital. She denies frank chest pain, LE swelling, headache, change in vision. She has f/u in about 1 month with pulmonology but states she is going to try to call them to see them sooner, especially since she had to be admitted, recently. She denies fever, cough, productive sputum, or increased O2 need (is chronically on 4L by [place], 24 hours a day).  Pt discussed at length that she feels her Abilify dose (2 mg daily) is "too much" for her, and states it contributes to her left leg pain / muscle spasm-jerking movements, etc; in the hospital, neurology was consulted and her problem was deemed not to be due to epileptiform activity, though consultants felt that Abilify could potentially be related. She states the Abilify also makes her very sleepy and makes her feel "not like herself." She continues to see "Rachel [name]" at counseling and states she is "getting used to him,"   Review of Systems: As above. Denies productive cough, fever / chills, etc, as above.     Objective:   Physical Exam BP 141/76  Pulse 85  Ht _0  (1.6 m)  Wt 165 lb (74.844 kg)  BMI 29.24 kg/m2  SpO2 93% Gen: chronically-ill but nontoxic-appearing adult female in NAD HEENT: [place]/AT, EOMI, PERRLA, TM clear on left (see note below about cerumen), not visualized on right  No cervical lymphadenopathy, MMM, no tonsillar swelling or exudates Cardio: RRR, no frank murmur noted Pulm: lungs fairly clear bilaterally, with some chronic coarse breath sounds but equal / good air movement bilaterally on 4L by [place] Abd: soft, nontender, BS+ Ext: left leg with spasms / jerking movements, irregular, and less prominent or gone when pt concentrates on something else  Minimal edema of LE at ankles; left leg tender  diffusely in thigh across multiple dermatomes Neuro/Psych: alert / oriented, without gross focal deficit  Note large amount of impacted cerumen removed from bilateral ear canals with plastic curette. Attempted flushing of right ear in addition to curetting, but pt did not tolerate; both ears with improved appearance, regardless. Right ear with moderate amount of hard cerumen remaining in canal after curetting / flushing.     Assessment & Plan:  See problem list notes.

## 2013-11-01 NOTE — Patient Instructions (Signed)
Thank you for coming in, today!  I'm glad your breathing is doing better. Make sure you follow up with your lung doctor. Call them and try to move that appointment up. If you need, let me know if you want me to try calling them, too.  Cut back your Abilify to every other day. Your new pill bottle will say every other day on it. Follow up with [name], and think about seeing a psychiatrist in the future. We'll talk more about it as the year goes on.  For your ears, you can use hydrogen peroxide drops to help soften your wax. NEVER push things down into your ears.  Come back to see me in about 2-3 months. Come back sooner, if you need. Please feel free to call with any questions or concerns at any time, at [phone]. --Dr. Venetia Maxon

## 2013-11-02 LAB — HEAVY METALS, RANDOM URINE
ARSENICRANUR: 10 ug/g{creat} (ref <51)
Creatinine Random, Urine: 91.3 mg/dL (ref 20.0–320.0)
Lead Random, Urine: 3 mcg/g creat (ref <10)

## 2013-11-02 NOTE — Assessment & Plan Note (Signed)
Greater than 30 minutes was spent in face-to-face time with this patient, more than half of which was devoted to counseling and development/coordination of care and treatment planning regarding psychotropic medication and BH follow-up. On Abilify, seeing counselor regularly, but not willing currently to see a psychiatrist, as she feels like she is "doing okay," currently. Does request that Abilify be adjusted downward, with concern that it is causing significant sleepiness daily as well as muscle spasm / jerks / ?clonus of left leg (though this appears to be at least partially volitional, as pt can be told to 'relax' and it diminishes, and it disappears when she is focused on something else). Advised pt to continue seeing counselor and that for now, reducing Abilify to 2 mg every other day is acceptable, but that I will continue to advise and recommend that she start seeing a psychiatrist, which she states may be something she's willing to do in the future. Plan to f/u as needed, for now.

## 2013-11-02 NOTE — Assessment & Plan Note (Addendum)
Reasonably controlled with acute-on-chronic flare of sarcoid improved after recent hospitalization with low-dose steroids (pt has history of psychosis with high-dose steroids). Upcoming f/u scheduled next month with pulm; pt instructed to try to move this appointment up, if able. Continue Symbicort, O2 continuously, and albuterol as needed. F/u with pulm as above, and with me as needed.

## 2013-11-03 ENCOUNTER — Telehealth: Payer: Self-pay | Admitting: Family Medicine

## 2013-11-03 ENCOUNTER — Other Ambulatory Visit: Payer: Self-pay | Admitting: Cardiology

## 2013-11-03 MED ORDER — ESOMEPRAZOLE MAGNESIUM 40 MG PO CPDR: 40.0000 mg | DELAYED_RELEASE_CAPSULE | ORAL | Status: DC

## 2013-11-03 MED ORDER — PAROXETINE HCL 20 MG PO TABS: 20.0000 mg | ORAL_TABLET | Freq: Every day | ORAL | Status: DC

## 2013-11-03 MED ORDER — POTASSIUM CHLORIDE CRYS ER 20 MEQ PO TBCR
20.0000 meq | EXTENDED_RELEASE_TABLET | Freq: Two times a day (BID) | ORAL | Status: DC
Start: 2013-11-03 — End: 2013-11-03

## 2013-11-03 MED ORDER — BUDESONIDE-FORMOTEROL FUMARATE 160-4.5 MCG/ACT IN AERO: 1.0000 | INHALATION_SPRAY | Freq: Two times a day (BID) | RESPIRATORY_TRACT | Status: DC

## 2013-11-03 MED ORDER — POTASSIUM CHLORIDE CRYS ER 20 MEQ PO TBCR: 20.0000 meq | EXTENDED_RELEASE_TABLET | Freq: Two times a day (BID) | ORAL | Status: DC

## 2013-11-03 MED ORDER — BUDESONIDE-FORMOTEROL FUMARATE 160-4.5 MCG/ACT IN AERO: 1.0000 | INHALATION_SPRAY | Freq: Two times a day (BID) | RESPIRATORY_TRACT | Status: AC

## 2013-11-03 NOTE — Telephone Encounter (Signed)
Rx's sent in, Paxil to [facility] and K-Dur / Nexium / Symbicort to [facility]. Symbicort and Nexium may require prior authorizations, which I will complete if / when I receive forms from the pharmacy. Thanks! --CMS

## 2013-11-03 NOTE — Telephone Encounter (Signed)
Refill request for Paxil sent to [facility] on [address].  Also, refills for potassium chloride and Symbicort [facility] to [facility] [phone] (phone) and [phone] (f).

## 2013-11-03 NOTE — Telephone Encounter (Signed)
Also, [facility]ium [facility].

## 2013-11-10 ENCOUNTER — Emergency Department (HOSPITAL_COMMUNITY): Payer: Medicare Other

## 2013-11-10 ENCOUNTER — Telehealth: Payer: Self-pay | Admitting: *Deleted

## 2013-11-10 ENCOUNTER — Inpatient Hospital Stay (HOSPITAL_COMMUNITY)
Admission: EM | Admit: 2013-11-10 | Discharge: 2013-11-13 | DRG: 196 | Disposition: A | Discharge status: Home / Self Care | Payer: Medicare Other | Attending: Family Medicine | Admitting: Family Medicine

## 2013-11-10 ENCOUNTER — Encounter (HOSPITAL_COMMUNITY): Payer: Self-pay | Admitting: Emergency Medicine

## 2013-11-10 DIAGNOSIS — F323 Major depressive disorder, single episode, severe with psychotic features: Secondary | ICD-10-CM | POA: Diagnosis present

## 2013-11-10 DIAGNOSIS — K219 Gastro-esophageal reflux disease without esophagitis: Secondary | ICD-10-CM | POA: Diagnosis present

## 2013-11-10 DIAGNOSIS — I509 Heart failure, unspecified: Secondary | ICD-10-CM | POA: Diagnosis present

## 2013-11-10 DIAGNOSIS — R7309 Other abnormal glucose: Secondary | ICD-10-CM | POA: Diagnosis present

## 2013-11-10 DIAGNOSIS — R0600 Dyspnea, unspecified: Secondary | ICD-10-CM | POA: Diagnosis present

## 2013-11-10 DIAGNOSIS — F411 Generalized anxiety disorder: Secondary | ICD-10-CM | POA: Diagnosis present

## 2013-11-10 DIAGNOSIS — Z79899 Other long term (current) drug therapy: Secondary | ICD-10-CM

## 2013-11-10 DIAGNOSIS — I2789 Other specified pulmonary heart diseases: Secondary | ICD-10-CM | POA: Diagnosis present

## 2013-11-10 DIAGNOSIS — Z87891 Personal history of nicotine dependence: Secondary | ICD-10-CM | POA: Diagnosis not present

## 2013-11-10 DIAGNOSIS — I272 Pulmonary hypertension, unspecified: Secondary | ICD-10-CM

## 2013-11-10 DIAGNOSIS — I10 Essential (primary) hypertension: Secondary | ICD-10-CM | POA: Diagnosis present

## 2013-11-10 DIAGNOSIS — J189 Pneumonia, unspecified organism: Secondary | ICD-10-CM | POA: Diagnosis present

## 2013-11-10 DIAGNOSIS — T380X5A Adverse effect of glucocorticoids and synthetic analogues, initial encounter: Secondary | ICD-10-CM | POA: Diagnosis present

## 2013-11-10 DIAGNOSIS — R0902 Hypoxemia: Secondary | ICD-10-CM

## 2013-11-10 DIAGNOSIS — D869 Sarcoidosis, unspecified: Principal | ICD-10-CM | POA: Diagnosis present

## 2013-11-10 DIAGNOSIS — J99 Respiratory disorders in diseases classified elsewhere: Secondary | ICD-10-CM | POA: Diagnosis present

## 2013-11-10 DIAGNOSIS — D860 Sarcoidosis of lung: Secondary | ICD-10-CM

## 2013-11-10 DIAGNOSIS — IMO0002 Reserved for concepts with insufficient information to code with codable children: Secondary | ICD-10-CM | POA: Diagnosis not present

## 2013-11-10 DIAGNOSIS — Z95810 Presence of automatic (implantable) cardiac defibrillator: Secondary | ICD-10-CM

## 2013-11-10 DIAGNOSIS — Z7982 Long term (current) use of aspirin: Secondary | ICD-10-CM | POA: Diagnosis not present

## 2013-11-10 DIAGNOSIS — I428 Other cardiomyopathies: Secondary | ICD-10-CM | POA: Diagnosis present

## 2013-11-10 LAB — BASIC METABOLIC PANEL
ANION GAP: 10 (ref 5–15)
BUN: 15 mg/dL (ref 6–23)
CHLORIDE: 105 meq/L (ref 96–112)
CO2: 29 mEq/L (ref 19–32)
Calcium: 9.4 mg/dL (ref 8.4–10.5)
Creatinine, Ser: 0.65 mg/dL (ref 0.50–1.10)
GFR calc Af Amer: >90 mL/min (ref >90)
GFR calc non Af Amer: >90 mL/min (ref >90)
Glucose, Bld: 187 mg/dL — ABNORMAL HIGH (ref 70–99)
POTASSIUM: 4.7 meq/L (ref 3.7–5.3)
Sodium: 144 mEq/L (ref 137–147)

## 2013-11-10 LAB — CBC WITH DIFFERENTIAL/PLATELET
BASOS PCT: 0 % (ref 0–1)
Basophils Absolute: 0 10*3/uL (ref 0.0–0.1)
Eosinophils Absolute: 0 10*3/uL (ref 0.0–0.7)
Eosinophils Relative: 0 % (ref 0–5)
HCT: 34.2 % — ABNORMAL LOW (ref 36.0–46.0)
HEMOGLOBIN: 10.6 g/dL — AB (ref 12.0–15.0)
LYMPHS PCT: 9 % — AB (ref 12–46)
Lymphs Abs: 0.8 10*3/uL (ref 0.7–4.0)
MCH: 29 pg (ref 26.0–34.0)
MCHC: 31 g/dL (ref 30.0–36.0)
MCV: 93.4 fL (ref 78.0–100.0)
MONOS PCT: 1 % — AB (ref 3–12)
Monocytes Absolute: 0.1 10*3/uL (ref 0.1–1.0)
NEUTROS ABS: 8.7 10*3/uL — AB (ref 1.7–7.7)
NEUTROS PCT: 90 % — AB (ref 43–77)
Platelets: 285 10*3/uL (ref 150–400)
RBC: 3.66 MIL/uL — ABNORMAL LOW (ref 3.87–5.11)
RDW: 14.8 % (ref 11.5–15.5)
WBC: 9.7 10*3/uL (ref 4.0–10.5)

## 2013-11-10 LAB — TROPONIN I: Troponin I: <0.3 ng/mL (ref <0.30)

## 2013-11-10 LAB — LACTIC ACID, PLASMA: LACTIC ACID, VENOUS: 1.3 mmol/L (ref 0.5–2.2)

## 2013-11-10 MED ORDER — ONDANSETRON HCL 4 MG/2ML IJ SOLN
4.0000 mg | Freq: Four times a day (QID) | INTRAMUSCULAR | Status: DC | PRN
  Administered 2013-11-11: 4 mg via INTRAVENOUS
  Filled 2013-11-10: qty 2

## 2013-11-10 MED ORDER — IPRATROPIUM BROMIDE 0.02 % IN SOLN
1.0000 mg | Freq: Once | RESPIRATORY_TRACT | Status: AC
  Administered 2013-11-10: 1 mg via RESPIRATORY_TRACT
  Filled 2013-11-10: qty 5

## 2013-11-10 MED ORDER — BUDESONIDE-FORMOTEROL FUMARATE 160-4.5 MCG/ACT IN AERO
1.0000 | INHALATION_SPRAY | Freq: Two times a day (BID) | RESPIRATORY_TRACT | Status: DC
  Administered 2013-11-10 – 2013-11-13 (×5): 1 via RESPIRATORY_TRACT
  Filled 2013-11-10: qty 6

## 2013-11-10 MED ORDER — SODIUM CHLORIDE 0.9 % IV SOLN: 250.0000 mL | INTRAVENOUS | Status: DC | PRN

## 2013-11-10 MED ORDER — ALBUTEROL (5 MG/ML) CONTINUOUS INHALATION SOLN
10.0000 mg/h | INHALATION_SOLUTION | Freq: Once | RESPIRATORY_TRACT | Status: AC
Start: 2013-11-10 — End: 2013-11-10
  Administered 2013-11-10: 10 mg/h via RESPIRATORY_TRACT
  Filled 2013-11-10: qty 20

## 2013-11-10 MED ORDER — SODIUM CHLORIDE 0.9 % IJ SOLN: 3.0000 mL | INTRAMUSCULAR | Status: DC | PRN

## 2013-11-10 MED ORDER — PANTOPRAZOLE SODIUM 40 MG PO TBEC
40.0000 mg | DELAYED_RELEASE_TABLET | Freq: Every day | ORAL | Status: DC
  Administered 2013-11-11 – 2013-11-12 (×2): 40 mg via ORAL
  Filled 2013-11-10 (×2): qty 1

## 2013-11-10 MED ORDER — CARVEDILOL 6.25 MG PO TABS
6.2500 mg | ORAL_TABLET | Freq: Two times a day (BID) | ORAL | Status: DC
  Administered 2013-11-11: 6.25 mg via ORAL
  Filled 2013-11-10 (×3): qty 1

## 2013-11-10 MED ORDER — TREPROSTINIL 0.6 MG/ML IN SOLN
18.0000 ug | Freq: Four times a day (QID) | RESPIRATORY_TRACT | Status: DC
  Administered 2013-11-11 – 2013-11-12 (×6): 18 ug via RESPIRATORY_TRACT

## 2013-11-10 MED ORDER — SODIUM CHLORIDE 0.9 % IJ SOLN
3.0000 mL | Freq: Two times a day (BID) | INTRAMUSCULAR | Status: DC
  Administered 2013-11-11 – 2013-11-13 (×2): 3 mL via INTRAVENOUS

## 2013-11-10 MED ORDER — ASPIRIN EC 81 MG PO TBEC
81.0000 mg | DELAYED_RELEASE_TABLET | Freq: Every day | ORAL | Status: DC
  Administered 2013-11-11 – 2013-11-12 (×2): 81 mg via ORAL
  Filled 2013-11-10 (×3): qty 1

## 2013-11-10 MED ORDER — ENOXAPARIN SODIUM 40 MG/0.4ML ~~LOC~~ SOLN
40.0000 mg | SUBCUTANEOUS | Status: DC
  Administered 2013-11-11 – 2013-11-12 (×3): 40 mg via SUBCUTANEOUS
  Filled 2013-11-10 (×4): qty 0.4

## 2013-11-10 MED ORDER — PAROXETINE HCL 20 MG PO TABS
20.0000 mg | ORAL_TABLET | Freq: Every day | ORAL | Status: DC
  Administered 2013-11-11 – 2013-11-12 (×2): 20 mg via ORAL
  Filled 2013-11-10 (×3): qty 1

## 2013-11-10 MED ORDER — IPRATROPIUM-ALBUTEROL 0.5-2.5 (3) MG/3ML IN SOLN
3.0000 mL | RESPIRATORY_TRACT | Status: DC
  Administered 2013-11-10: 3 mL via RESPIRATORY_TRACT
  Filled 2013-11-10: qty 3

## 2013-11-10 MED ORDER — FUROSEMIDE 40 MG PO TABS
40.0000 mg | ORAL_TABLET | Freq: Every day | ORAL | Status: DC
  Administered 2013-11-11: 40 mg via ORAL
  Filled 2013-11-10: qty 1

## 2013-11-10 MED ORDER — PREDNISONE 10 MG PO TABS
10.0000 mg | ORAL_TABLET | Freq: Every day | ORAL | Status: DC
  Administered 2013-11-11 – 2013-11-13 (×3): 10 mg via ORAL
  Filled 2013-11-10 (×4): qty 1

## 2013-11-10 MED ORDER — INSULIN ASPART 100 UNIT/ML ~~LOC~~ SOLN: 0.0000 [IU] | Freq: Three times a day (TID) | SUBCUTANEOUS | Status: DC

## 2013-11-10 MED ORDER — ONDANSETRON HCL 4 MG PO TABS: 4.0000 mg | ORAL_TABLET | Freq: Four times a day (QID) | ORAL | Status: DC | PRN

## 2013-11-10 MED ORDER — SILDENAFIL CITRATE 20 MG PO TABS
80.0000 mg | ORAL_TABLET | Freq: Three times a day (TID) | ORAL | Status: DC
  Administered 2013-11-11 – 2013-11-12 (×7): 80 mg via ORAL
  Filled 2013-11-10 (×10): qty 4

## 2013-11-10 MED ORDER — METHYLPREDNISOLONE SODIUM SUCC 125 MG IJ SOLR
125.0000 mg | Freq: Once | INTRAMUSCULAR | Status: AC
  Administered 2013-11-10: 125 mg via INTRAVENOUS
  Filled 2013-11-10: qty 2

## 2013-11-10 MED ORDER — SODIUM CHLORIDE 0.9 % IJ SOLN
3.0000 mL | Freq: Two times a day (BID) | INTRAMUSCULAR | Status: DC
  Administered 2013-11-11 – 2013-11-12 (×5): 3 mL via INTRAVENOUS

## 2013-11-10 MED ORDER — PREDNISONE 10 MG PO TABS: 10.0000 mg | ORAL_TABLET | Freq: Every day | ORAL | Status: DC

## 2013-11-10 MED ORDER — LEVOFLOXACIN IN D5W 750 MG/150ML IV SOLN
750.0000 mg | INTRAVENOUS | Status: DC
  Administered 2013-11-11 (×2): 750 mg via INTRAVENOUS
  Filled 2013-11-10 (×3): qty 150

## 2013-11-10 MED ORDER — ARIPIPRAZOLE 2 MG PO TABS
2.0000 mg | ORAL_TABLET | ORAL | Status: DC
  Administered 2013-11-11: 2 mg via ORAL
  Filled 2013-11-10 (×2): qty 1

## 2013-11-10 NOTE — ED Notes (Signed)
Pt has had SOB for 3 weeks. Fire gave pt 50m albuterol lungs sounded clear for EMS. Sats 50% on nasal cannual 6L. EMS placed on nonrebreather- now sats 89%. Pt has hx of CHF, COPD. BP 150/70, HR 104 ST. Pt has been coughing up mucous

## 2013-11-10 NOTE — ED Notes (Signed)
Attempted report x1. 

## 2013-11-10 NOTE — ED Notes (Signed)
Pt's sats 74% on room air here in ED. Placed back on nonrebreather. Reminded pt to take slow deep breaths, sats now 99% on nonrebreather.

## 2013-11-10 NOTE — H&P (Signed)
[facility] Admission History and Physical Service Pager: [phone]  Patient name: Rachel [name] Medical record number: [id number] Date of birth: Aug 12, 1960 Age: 54 y.o. Gender: female  Primary Care Provider: Christa See, MD Consultants: none Code Status: Full  Chief Complaint: SOB  Assessment and Plan: Rachel [name] is a 53 y.o. female presenting with acute dyspnea . PMH is significant for pulmonary sarcoidosis, pulmonary hypertension, cardiomyopathy, CHF, SVT, major depressive disorder with psychotic features, HTN.  1. Acute dyspnea: DDx: PE vs CHF vs Pneumonia vs secondary to exacerbation of underlying chronic lung disease.  Was hypoxic on arrival to ED (74%).  Was given solumedrol and CAT x 2 with improvements of O2 saturation 95%.  CXR negative for acute infiltrate. Normal WBC count but with left shift.  EKG unremarkable with no signs of ischemia.  No signs of volume overload on exam.  -Admit under Dr Rachel [name] to step down -O2 currently on ventimask, respiratory to assess and wean -Duonebs q4 -Continue home prednisone 78m, Symbicort, Revatio, and Tyvaso -sputum and blood cultures ordered -CXR revealed chronic changes but could not identify acute abnormalities; however given increasing sputum production, tactile fever, and chills will treat empirically with Levaquin and monitor closely for improvement.  - Will consider changing unselective Betablocker in setting of pulmonary disease - Low threshold for pulmonary consultation if she worsens or fails to improve.  2. Hyperglycemia - Noted on BMP. No h/o DM in EMR. Likely secondary to chronic steroid use. -CBGs TIDAC and QHS - SSI sensitive - A1C ordered  3. CHF: (07/2013) Echo: EF 614%Grade 1 diastolic dysfunction with mild systolic dysfunction, PA systolic pressure [id number]-Continue home lasix -BNP ordered -Saline locked d/t h/o CHF.  Patient does not look fluid overloaded presently.  4. Pulmonary  HTN with RV failure: Moderate-Severe. -Followed by Pulmonology Dr Rachel [name]-Continuing home Revatio and Tyvaso -O2 as above  5. GERD: -On nexium at home, Protonix ordered for patient  6. Anxiety/Major depressive disorder -Continue Abilify, Paxil  7. HTN: Stable now.  BP 133/68 -Continue coreg for now. -Consider alternative as this is a nonselective betablocker and may be impacting her breathing.  FEN/GI: Heart healthy, Protonix  Prophylaxis: Lovenox subq  Disposition: Admit to step down, d/c pending resolution of SOB/stabilization.  History of Present Illness: Rachel [name] a 53y.o. female presenting with acute SOB.  Patient states that she has had SOB and a constant headache that has been going on for the last 3 days.  SOB has gotten progressively worse since discharge on 7/25 and particularly over the past 3 days.  SOB accompanied by a productive cough.  Patient reports tactile fever and recent chills.  She also notes "chest tightness".  Dyspnea has persisted despite frequent albuterol use and increasing home O2 to 5 L (baseline 4 L [place]).  Other associated symptoms: nausea, poor appetite, and diarrhea.  Patient also endorses orthopnea; denies PND and LE edema.  In the ED, patient was dyspneic on arrival with O2 sat of 74%.  Patient was given IV Solumedrol 125 mg and CAT 10 mg x 2 with improvement in dyspnea and O2 sat (up to 95%).   Of note, patient is followed closely by Pulmonology Dr. BLamonte [name]  She has very severe lung disease and is on Tyvaso (started on 7/30), Revatio, Symbicort.  She was recently started on Prednisone 10 mg by PCP, Dr. SVenetia Maxongiven increasing SOB and inability to get urgent appt with Pulmonology.   Review Of Systems:  Per HPI with the following additions:  Otherwise 12 point review of systems was performed and was unremarkable.  Patient Active Problem List   Diagnosis Date Noted  . Spinal stenosis of lumbar region 10/27/2013  . Abnormal involuntary movements  10/26/2013  . Shortness of breath 10/24/2013  . Major depressive disorder with psychotic features 08/31/2013  . Loss of weight 08/12/2013  . SVT (supraventricular tachycardia) 08/05/2013  . CONGESTIVE HEART FAILURE, RIGHT 09/21/2008  . PULMONARY HYPERTENSION 09/05/2008  . PULMONARY SARCOIDOSIS 05/03/2008  . ANXIETY 06/05/2006  . HYPERTENSION, BENIGN SYSTEMIC 06/05/2006  . CARDIOMYOPATHY, IDIOPATHIC 06/05/2006  . GASTROESOPHAGEAL REFLUX, NO ESOPHAGITIS 06/05/2006   Past Medical History: Past Medical History  Diagnosis Date  . GERD (gastroesophageal reflux disease)   . Hypertension   . Sarcoidosis     End stage- Rachel [name]  . CHF (congestive heart failure)     Right side- Rachel [name] ([place])  . Pulmonary HTN   . Anxiety   . Cardiomyopathy   . NSVT (nonsustained ventricular tachycardia)     with syncope: St Jude ICD was implanted. Device now nearing ERI. Given end stage lung disease and normalization of LV function, the device will not be replaced and tachy therapies were turned off  . Chronic chest pain     LHC (08/2008) with no angiographic coronary diease  . Allergic rhinitis   . H/O: iron deficiency anemia   . Secondary amenorrhea     irregular menses  . Psychosis     with high dose steroids  . SVT (supraventricular tachycardia)     possible runs  . Hypokalemia     recurrent  . Rotator cuff sprain   . Chronic back pain    Past Surgical History: Past Surgical History  Procedure Laterality Date  . Cardiac catheterization  08/26/2008    5% EF with normal coronary arteries and normal wall motion  . Adenosine cardiolyte  03/31/2003    no ischemia/infarct; marked global LV hypekinesis; resting EF 22%  . Cardiac catheterization  03/31/2003    globally depressed LV fxn with EF 20%; idiopathic dilated cardiomyopathy  . Defibrillator placed  03/31/2003    St. Jude single chamber (Dr. Cristopher [name])  . Pfts      FVC 2000 (56%) FEV1 1400 (47%), ratio 0.7; insignif response to  bronchodilatior; stable from 1/05 - 9/05; PFTs: Mod restriction, FVC 1.9L, FEV1 1.6L, both 53% predicted; slt response to brochodilators 04/08/2002   Social History: History  Substance Use Topics  . Smoking status: Former Smoker -- 1.50 packs/day for 11 years    Types: Cigarettes    Quit date: 04/08/1992  . Smokeless tobacco: Never Used     Comment: 1ppd x 20 years  . Alcohol Use: Yes     Comment: remote history of alcohol abuse (quit 1994)    Family History: Family History  Problem Relation Age of Onset  . Lung cancer Father   . Cancer Father     lung cancer  . Hypertension    . Diabetes Mother     border line  . Hypertension Mother   . Heart failure Mother    Allergies and Medications: Allergies  Allergen Reactions  . Latex Rash  . Nitroglycerin Other ([name] Comments)    Patient is on Revatio  . Other Other ([name] Comments)    High Dose Steroids cause chemical imbalance and altered mental status  . Meperidine Hcl Other ([name] Comments)    REACTION: Makes her feel funny  . Tramadol  Hcl Other ([name] Comments)    REACTION: nausea, lightheadedness, sleepiness, and dizziness  . Ace Inhibitors Other ([name] Comments)    unknown  . Morphine Other ([name] Comments)    Other   . Propoxyphene N-Acetaminophen Rash   No current facility-administered medications on file prior to encounter.   Current Outpatient Prescriptions on File Prior to Encounter  Medication Sig Dispense Refill  . albuterol (PROAIR HFA) 108 (90 BASE) MCG/ACT inhaler Inhale 2 puffs into the lungs 4 (four) times daily.  18 g  11  . ARIPiprazole (ABILIFY) 2 MG tablet Take 1 tablet (2 mg total) by mouth every other day.  30 tablet  1  . aspirin EC 81 MG tablet Take 1 tablet (81 mg total) by mouth daily.  1 tablet  0  . budesonide-formoterol (SYMBICORT) 160-4.5 MCG/ACT inhaler Inhale 1 puff into the lungs 2 (two) times daily.  1 Inhaler  3  . carvedilol (COREG) 6.25 MG tablet Take 1 tablet (6.25 mg total) by mouth 2 (two)  times daily with a meal.  60 tablet  0  . esomeprazole (NEXIUM) 40 MG capsule Take 1 capsule (40 mg total) by mouth every morning.  30 capsule  3  . PARoxetine (PAXIL) 20 MG tablet Take 1 tablet (20 mg total) by mouth daily.  30 tablet  2  . potassium chloride SA (K-DUR,KLOR-CON) 20 MEQ tablet Take 1-2 tablets (20-40 mEq total) by mouth 2 (two) times daily. 40 meq in the morning and 20 meq at night  90 tablet  3  . predniSONE (DELTASONE) 10 MG tablet Take 1 tablet (10 mg total) by mouth daily. Take with food.  5 tablet  0  . sildenafil (REVATIO) 20 MG tablet Take 4 tablets (80 mg total) by mouth 3 (three) times daily. take 4  tablets every 8 hours  10 tablet  0  . Treprostinil (TYVASO) 0.6 MG/ML SOLN Inhale 18 mcg into the lungs 4 (four) times daily.  3.6 mL  11    Objective: BP 102/87  Pulse 88  Temp(Src) 97.8 F (36.6 C) (Oral)  Resp 28  SpO2 94% Exam: General: well nourished female, sitting in bed, mildly distressed, continuous neb currently being administered HEENT: AT/[place] Cardiovascular: S1S2, no murmurs appreciated Respiratory: Increased work of breathing but speaks in full sentences; diffuse crackles throughout all lung fields. Abdomen: soft, NT/ND Extremities: WWP, no LE edema Skin: dry, intact, no rashes noted Neuro: CN 2-12 grossly intact, AAOx4  Labs and Imaging: CBC BMET   Recent Labs Lab 11/10/13 1830  WBC 9.7  HGB 10.6*  HCT 34.2*  PLT 285    Recent Labs Lab 11/10/13 1830  NA 144  K 4.7  CL 105  CO2 29  BUN 15  CREATININE 0.65  GLUCOSE 187*  CALCIUM 9.4     Cardiac Panel (last 3 results)  Recent Labs  11/10/13 1830  TROPONINI <0.30   Dg Chest Port 1 View  11/10/2013   CLINICAL DATA:  Shortness of breath. Sarcoidosis. Pulmonary hypertension.  EXAM: PORTABLE CHEST - 1 VIEW  COMPARISON:  10/24/2013 and 09/20/2013 and 09/23/2011  FINDINGS: AICD in place. The patient has severe chronic lung disease consistent with sarcoidosis. There is chronic  enlargement of the main pulmonary arteries consistent with the history of pulmonary arterial hypertension. There is haziness in both lung bases which is essentially unchanged since the prior exam.  IMPRESSION: Severe chronic lung disease. No significant change since the prior study.      Janora Norlander,  DO 11/10/2013, 7:55 PM PGY-1, [place] [facility] pager: [phone], text pages welcome  I have seen and evaluated the above patient.  Addendum in blue.  [place] PGY-3

## 2013-11-10 NOTE — Telephone Encounter (Signed)
Pt called this morning stating her breathing is the same way when she was hospitalized.  She gets very SOB trying to put on clothes.  She stated that if provider could prescribe prednisone 10 mg; she was taking that while in the hospital and that seems to help her.  She would like her PCP to contact her ASAP.  Pt advised she should schedule an appt or go to ED.  Pt stated she is very weak and SOB.  Will forward to PCP. Derl Barrow, RN

## 2013-11-10 NOTE — ED Notes (Signed)
Pt placed on venti mask by RN.  Pt currently sating between 91-94% on venti mask.

## 2013-11-10 NOTE — ED Notes (Signed)
Pt taken off continuous nebulizer. Placed on 5L [place].  Pt currently sating between 88-91%.

## 2013-11-10 NOTE — Telephone Encounter (Signed)
Will call pt shortly. Anticipate Rx for prednisone may be helpful, but agree eval in clinic or the ED may be warranted. --CMS

## 2013-11-10 NOTE — Telephone Encounter (Signed)
Discussed current symptoms with pt. Mostly chest tightness / congestion, similar to previous hospitalization. No fevers and no large productive cough. Pt tried to get into see pulmonology sooner than scheduled appt later this month but there were no openings. Currently taking Tyvaso (just started recently) which seems to help some, but has not taken Symbicort today since she felt like it "closed her lungs up" more. Of note, pt did not sound especially short of breath speaking to me and was speaking in full sentences.  Advised pt to continue talking Symbicort as prescribed, as well as scheduled albuterol (which may help with the feeling of the coldness of spray from Symbicort causing some bronchospasm), and to continue Tyvaso. Will call in Rx for prednisone 10 mg for 5 days; may ultimately be worth keeping her on low-dose prednisone indefinitely but will definitely defer to pulm on that. Strongly advised pt to call today to make an appointment to see someone in our clinic tomorrow or Friday, sooner rather than later especially if her lung symptoms get worse. Pt voiced understanding.  Rachel Kluver, MD PGY-3, [facility] 11/10/2013, 9:44 AM

## 2013-11-10 NOTE — ED Notes (Signed)
Pt to be placed on venti mask by respiratory therapist.

## 2013-11-10 NOTE — ED Provider Notes (Signed)
CSN: [id number]     Arrival date & time 11/10/13  1809 History   First MD Initiated Contact with Patient 11/10/13 1810     Chief Complaint  Patient presents with  . Shortness of Breath      HPI Pt was seen at 1810.  Per EMS and pt report, c/o gradual onset and worsening of persistent cough, wheezing and SOB for the past 3 days.  Describes her symptoms as "my sarcoidosis." Has been using home MDI and O2 without relief. EMS states O2 Sats were "50% R/A," so they gave neb and NRB O2 en route with Sats improving to 99% on NRB.  Denies CP/palpitations, no back pain, no abd pain, no N/V/D, no fevers, no rash.     Past Medical History  Diagnosis Date  . GERD (gastroesophageal reflux disease)   . Hypertension   . 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 CHF (congestive heart failure)     Right side- Dr. Aundra Dubin ([place])  . Pulmonary HTN   . Anxiety   . Cardiomyopathy   . NSVT (nonsustained ventricular tachycardia)     with syncope: St Jude ICD was implanted. Device now nearing ERI. Given end stage lung disease and normalization of LV function, the device will not be replaced and tachy therapies were turned off  . Chronic chest pain     LHC (08/2008) with no angiographic coronary diease  . Allergic rhinitis   . H/O: iron deficiency anemia   . Secondary amenorrhea     irregular menses  . Psychosis     with high dose steroids  . SVT (supraventricular tachycardia)     possible runs  . Hypokalemia     recurrent  . Rotator cuff sprain   . Chronic back pain    Past Surgical History  Procedure Laterality Date  . Cardiac catheterization  08/26/2008    5% EF with normal coronary arteries and normal wall motion  . Adenosine cardiolyte  03/31/2003    no ischemia/infarct; marked global LV hypekinesis; resting EF 22%  . Cardiac catheterization  03/31/2003    globally depressed LV fxn with EF 20%; idiopathic dilated cardiomyopathy  . Defibrillator placed  03/31/2003    St. Jude single chamber  (Dr. Cristopher Peru)  . Pfts      FVC 2000 (56%) FEV1 1400 (47%), ratio 0.7; insignif response to bronchodilatior; stable from 1/05 - 9/05; PFTs: Mod restriction, FVC 1.9L, FEV1 1.6L, both 53% predicted; slt response to brochodilators 04/08/2002   Family History  Problem Relation Age of Onset  . Lung cancer Father   . Cancer Father     lung cancer  . Hypertension    . Diabetes Mother     border line  . Hypertension Mother   . Heart failure Mother    History  Substance Use Topics  . Smoking status: Former Smoker -- 1.50 packs/day for 11 years    Types: Cigarettes    Quit date: 04/08/1992  . Smokeless tobacco: Never Used     Comment: 1ppd x 20 years  . Alcohol Use: Yes     Comment: remote history of alcohol abuse (quit 1994)    Review of Systems ROS: Statement: All systems negative except as marked or noted in the HPI; Constitutional: Negative for fever and chills. ; ; Eyes: Negative for eye pain, redness and discharge. ; ; ENMT: Negative for ear pain, hoarseness, nasal congestion, sinus pressure and sore throat. ; ; Cardiovascular: Negative for  chest pain, palpitations, diaphoresis, and peripheral edema. ; ; Respiratory: +SOB, wheezing, cough. Negative for stridor. ; ; Gastrointestinal: Negative for nausea, vomiting, diarrhea, abdominal pain, blood in stool, hematemesis, jaundice and rectal bleeding. . ; ; Genitourinary: Negative for dysuria, flank pain and hematuria. ; ; Musculoskeletal: Negative for back pain and neck pain. Negative for swelling and trauma.; ; Skin: Negative for pruritus, rash, abrasions, blisters, bruising and skin lesion.; ; Neuro: Negative for headache, lightheadedness and neck stiffness. Negative for weakness, altered level of consciousness , altered mental status, extremity weakness, paresthesias, involuntary movement, seizure and syncope.      Allergies  Latex; Nitroglycerin; Other; Meperidine hcl; Tramadol hcl; Ace inhibitors; Morphine; and Propoxyphene  n-acetaminophen  Home Medications   Prior to Admission medications   Medication Sig Start Date End Date Taking? Authorizing Provider  albuterol (PROAIR HFA) 108 (90 BASE) MCG/ACT inhaler Inhale 2 puffs into the lungs 4 (four) times daily. 10/01/13  Yes Willeen Niece, MD  ARIPiprazole (ABILIFY) 2 MG tablet Take 1 tablet (2 mg total) by mouth every other day. 11/01/13  Yes Sharon Mt Street, MD  aspirin EC 81 MG tablet Take 1 tablet (81 mg total) by mouth daily. 08/21/13  Yes Waylan Boga, NP  budesonide-formoterol (SYMBICORT) 160-4.5 MCG/ACT inhaler Inhale 1 puff into the lungs 2 (two) times daily. 11/03/13  Yes Sharon Mt [address], MD  carvedilol (COREG) 6.25 MG tablet Take 1 tablet (6.25 mg total) by mouth 2 (two) times daily with a meal. 08/21/13  Yes Waylan Boga, NP  esomeprazole (NEXIUM) 40 MG capsule Take 1 capsule (40 mg total) by mouth every morning. 11/03/13  Yes Sharon Mt Street, MD  furosemide (LASIX) 40 MG tablet Take 40 mg by mouth daily.   Yes Historical Provider, MD  PARoxetine (PAXIL) 20 MG tablet Take 1 tablet (20 mg total) by mouth daily. 11/03/13  Yes Sharon Mt [address], MD  potassium chloride SA (K-DUR,KLOR-CON) 20 MEQ tablet Take 1-2 tablets (20-40 mEq total) by mouth 2 (two) times daily. 40 meq in the morning and 20 meq at night 11/03/13  Yes Sharon Mt Street, MD  predniSONE (DELTASONE) 10 MG tablet Take 1 tablet (10 mg total) by mouth daily. Take with food. 11/10/13  Yes Sharon Mt [address], MD  sildenafil (REVATIO) 20 MG tablet Take 4 tablets (80 mg total) by mouth 3 (three) times daily. take 4  tablets every 8 hours 08/21/13  Yes Waylan Boga, NP  Treprostinil (TYVASO) 0.6 MG/ML SOLN Inhale 18 mcg into the lungs 4 (four) times daily. 10/18/13  Yes Larey Dresser, MD   BP 102/87  Pulse 88  Temp(Src) 97.8 F (36.6 C) (Oral)  Resp 28  SpO2 94% Physical Exam 1815: Physical examination:  Nursing notes reviewed; Vital signs and O2 SAT reviewed;  Constitutional: Well  developed, Well nourished, Well hydrated, Uncomfortable appearing.; Head:  Normocephalic, atraumatic; Eyes: EOMI, PERRL, No scleral icterus; ENMT: Mouth and pharynx normal, Mucous membranes moist; Neck: Supple, Full range of motion, No lymphadenopathy; Cardiovascular: Tachycardic rate and rhythm, No gallop; Respiratory: Breath sounds diminished & equal bilaterally, faint scattered wheezes. No audible wheezing. Speaking in short phrases. Tachypneic, sitting upright.; Chest: Nontender, Movement normal; Abdomen: Soft, Nontender, Nondistended, Normal bowel sounds; Genitourinary: No CVA tenderness; Extremities: Pulses normal, No tenderness, No edema, No calf edema or asymmetry.; Neuro: AA&Ox3, Major CN grossly intact.  Speech clear. No gross focal motor or sensory deficits in extremities.; Skin: Color normal, Warm, Dry.   ED Course  Procedures  EKG Interpretation   Date/Time:  Wednesday November 10 2013 18:16:40 EDT Ventricular Rate:  104 PR Interval:  193 QRS Duration: 83 QT Interval:  366 QTC Calculation: 481 R Axis:   80 Text Interpretation:  Sinus tachycardia Consider left ventricular  hypertrophy Baseline wander Artifact When compared with ECG of 10/25/2013  No significant change was found Confirmed by [facility]  MD, Nunzio Cory  [phone]) on 11/10/2013 7:20:07 PM      MDM  MDM Reviewed: previous chart, nursing note and vitals Reviewed previous: labs, ECG and x-ray Interpretation: labs, ECG and x-ray Total time providing critical care: 30-74 minutes. This excludes time spent performing separately reportable procedures and services. Consults: admitting MD    CRITICAL CARE Performed by: Alfonzo Feller Total critical care time: 40 Critical care time was exclusive of separately billable procedures and treating other patients. Critical care was necessary to treat or prevent imminent or life-threatening deterioration. Critical care was time spent personally by me on the following  activities: development of treatment plan with patient and/or surrogate as well as nursing, discussions with consultants, evaluation of patient's response to treatment, examination of patient, obtaining history from patient or surrogate, ordering and performing treatments and interventions, ordering and review of laboratory studies, ordering and review of radiographic studies, pulse oximetry and re-evaluation of patient's condition.  Results for orders placed during the hospital encounter of 11/10/13  CBC WITH DIFFERENTIAL      Result Value Ref Range   WBC 9.7  4.0 - 10.5 K/uL   RBC 3.66 (*) 3.87 - 5.11 MIL/uL   Hemoglobin 10.6 (*) 12.0 - 15.0 g/dL   HCT 34.2 (*) 36.0 - 46.0 %   MCV 93.4  78.0 - 100.0 fL   MCH 29.0  26.0 - 34.0 pg   MCHC 31.0  30.0 - 36.0 g/dL   RDW 14.8  11.5 - 15.5 %   Platelets 285  150 - 400 K/uL   Neutrophils Relative % 90 (*) 43 - 77 %   Neutro Abs 8.7 (*) 1.7 - 7.7 K/uL   Lymphocytes Relative 9 (*) 12 - 46 %   Lymphs Abs 0.8  0.7 - 4.0 K/uL   Monocytes Relative 1 (*) 3 - 12 %   Monocytes Absolute 0.1  0.1 - 1.0 K/uL   Eosinophils Relative 0  0 - 5 %   Eosinophils Absolute 0.0  0.0 - 0.7 K/uL   Basophils Relative 0  0 - 1 %   Basophils Absolute 0.0  0.0 - 0.1 K/uL  BASIC METABOLIC PANEL      Result Value Ref Range   Sodium 144  137 - 147 mEq/L   Potassium 4.7  3.7 - 5.3 mEq/L   Chloride 105  96 - 112 mEq/L   CO2 29  19 - 32 mEq/L   Glucose, Bld 187 (*) 70 - 99 mg/dL   BUN 15  6 - 23 mg/dL   Creatinine, Ser 0.65  0.50 - 1.10 mg/dL   Calcium 9.4  8.4 - 10.5 mg/dL   GFR calc non Af Amer >90  >90 mL/min   GFR calc Af Amer >90  >90 mL/min   Anion gap 10  5 - 15  TROPONIN I      Result Value Ref Range   Troponin I <0.30  <0.30 ng/mL  LACTIC ACID, PLASMA      Result Value Ref Range   Lactic Acid, Venous 1.3  0.5 - 2.2 mmol/L    Dg Chest [facility] 1 [address]  11/10/2013   CLINICAL DATA:  Shortness of breath. Sarcoidosis. Pulmonary hypertension.  EXAM: PORTABLE CHEST -  1 VIEW  COMPARISON:  10/24/2013 and 09/20/2013 and 09/23/2011  FINDINGS: AICD in place. The patient has severe chronic lung disease consistent with sarcoidosis. There is chronic enlargement of the main pulmonary arteries consistent with the history of pulmonary arterial hypertension. There is haziness in both lung bases which is essentially unchanged since the prior exam.  IMPRESSION: Severe chronic lung disease. No significant change since the prior study.   Electronically Signed   By: Rozetta Nunnery M.D.   On: 11/10/2013 19:18    1820:  Pt sitting upright on arrival, tachypneic with O2 Sats 74% R/A. Will start hour long neb and dose IV solumedrol.   1940:  Hour long neb in progress, Sats 95% while on neb, lungs diminished and coarse bilat, no wheezing. Dx and testing d/w pt.  Questions answered.  Verb understanding, agreeable to admit. T/C to [facility] Resident, case discussed, including:  HPI, pertinent PM/SHx, VS/PE, dx testing, ED course and treatment:  Agreeable to admit, requests they will come to the ED for evaluation.   Francine Graven, DO 11/13/13 1336

## 2013-11-11 DIAGNOSIS — J99 Respiratory disorders in diseases classified elsewhere: Secondary | ICD-10-CM

## 2013-11-11 DIAGNOSIS — I2789 Other specified pulmonary heart diseases: Secondary | ICD-10-CM

## 2013-11-11 DIAGNOSIS — R0609 Other forms of dyspnea: Secondary | ICD-10-CM

## 2013-11-11 DIAGNOSIS — D869 Sarcoidosis, unspecified: Principal | ICD-10-CM

## 2013-11-11 DIAGNOSIS — R0989 Other specified symptoms and signs involving the circulatory and respiratory systems: Secondary | ICD-10-CM

## 2013-11-11 DIAGNOSIS — I509 Heart failure, unspecified: Secondary | ICD-10-CM

## 2013-11-11 LAB — GLUCOSE, CAPILLARY
Glucose-Capillary: 111 mg/dL — ABNORMAL HIGH (ref 70–99)
Glucose-Capillary: 110 mg/dL — ABNORMAL HIGH (ref 70–99)
Glucose-Capillary: 118 mg/dL — ABNORMAL HIGH (ref 70–99)
Glucose-Capillary: 111 mg/dL — ABNORMAL HIGH (ref 70–99)

## 2013-11-11 LAB — BASIC METABOLIC PANEL
ANION GAP: 10 (ref 5–15)
BUN: 13 mg/dL (ref 6–23)
CHLORIDE: 103 meq/L (ref 96–112)
CO2: 28 mEq/L (ref 19–32)
CREATININE: 0.53 mg/dL (ref 0.50–1.10)
Calcium: 9.4 mg/dL (ref 8.4–10.5)
GFR calc non Af Amer: >90 mL/min (ref >90)
Glucose, Bld: 129 mg/dL — ABNORMAL HIGH (ref 70–99)
POTASSIUM: 4.6 meq/L (ref 3.7–5.3)
Sodium: 141 mEq/L (ref 137–147)

## 2013-11-11 LAB — TROPONIN I
Troponin I: <0.3 ng/mL (ref <0.30)
Troponin I: <0.3 ng/mL (ref <0.30)

## 2013-11-11 LAB — MRSA PCR SCREENING: MRSA by PCR: NEGATIVE

## 2013-11-11 LAB — PRO B NATRIURETIC PEPTIDE: Pro B Natriuretic peptide (BNP): 1546 pg/mL — ABNORMAL HIGH (ref 0–125)

## 2013-11-11 LAB — CBC
HCT: 32.5 % — ABNORMAL LOW (ref 36.0–46.0)
Hemoglobin: 9.9 g/dL — ABNORMAL LOW (ref 12.0–15.0)
MCH: 28 pg (ref 26.0–34.0)
MCHC: 30.5 g/dL (ref 30.0–36.0)
MCV: 92.1 fL (ref 78.0–100.0)
Platelets: 269 10*3/uL (ref 150–400)
RBC: 3.53 MIL/uL — ABNORMAL LOW (ref 3.87–5.11)
RDW: 14.8 % (ref 11.5–15.5)
WBC: 6.9 10*3/uL (ref 4.0–10.5)

## 2013-11-11 LAB — HEMOGLOBIN A1C
HEMOGLOBIN A1C: 5.3 % (ref <5.7)
MEAN PLASMA GLUCOSE: 105 mg/dL (ref <117)

## 2013-11-11 MED ORDER — FUROSEMIDE 40 MG PO TABS
40.0000 mg | ORAL_TABLET | Freq: Two times a day (BID) | ORAL | Status: DC
  Administered 2013-11-11 – 2013-11-13 (×4): 40 mg via ORAL
  Filled 2013-11-11 (×6): qty 1

## 2013-11-11 MED ORDER — IPRATROPIUM-ALBUTEROL 0.5-2.5 (3) MG/3ML IN SOLN
3.0000 mL | Freq: Three times a day (TID) | RESPIRATORY_TRACT | Status: DC
Start: 2013-11-11 — End: 2013-11-13
  Administered 2013-11-11 – 2013-11-13 (×6): 3 mL via RESPIRATORY_TRACT
  Filled 2013-11-11 (×6): qty 3

## 2013-11-11 MED ORDER — METOPROLOL TARTRATE 25 MG PO TABS
25.0000 mg | ORAL_TABLET | Freq: Two times a day (BID) | ORAL | Status: DC
  Administered 2013-11-11 – 2013-11-12 (×3): 25 mg via ORAL
  Filled 2013-11-11 (×6): qty 1

## 2013-11-11 MED ORDER — ACETAMINOPHEN 325 MG PO TABS
650.0000 mg | ORAL_TABLET | Freq: Four times a day (QID) | ORAL | Status: DC | PRN
  Administered 2013-11-11 – 2013-11-13 (×6): 650 mg via ORAL
  Filled 2013-11-11 (×6): qty 2

## 2013-11-11 NOTE — Progress Notes (Addendum)
53 Y/O F with PMX of pulmonary sarcoidosis, anxiety, CHF, HTN,and pulmonary hypertension presented to the hospital with 3 days hx of worsening SOB. She mentioned she was recently discharged from the hospital about 1-2 wks ago for similar presentation, her symptoms never got to baseline since discharged from the hospital. She is compliant with home pulmonary regimen with no improvement of her symptoms. She endorsed cough productive of greyish thick sputum, not blood stained, endorsed chest pain with excessive coughing. SOB is worse with ambulation and laying flat in bed. In the last 3 days she has been using 2 pillows and sleeps propped up in bed. She denies fever. Sick contact was with her psychiatrist that visits her at home few days ago.Patient on 5 L O2 via [place] at home.  No current facility-administered medications on file prior to encounter.   Current Outpatient Prescriptions on File Prior to Encounter  Medication Sig Dispense Refill  . albuterol (PROAIR HFA) 108 (90 BASE) MCG/ACT inhaler Inhale 2 puffs into the lungs 4 (four) times daily.  18 g  11  . ARIPiprazole (ABILIFY) 2 MG tablet Take 1 tablet (2 mg total) by mouth every other day.  30 tablet  1  . aspirin EC 81 MG tablet Take 1 tablet (81 mg total) by mouth daily.  1 tablet  0  . budesonide-formoterol (SYMBICORT) 160-4.5 MCG/ACT inhaler Inhale 1 puff into the lungs 2 (two) times daily.  1 Inhaler  3  . carvedilol (COREG) 6.25 MG tablet Take 1 tablet (6.25 mg total) by mouth 2 (two) times daily with a meal.  60 tablet  0  . esomeprazole (NEXIUM) 40 MG capsule Take 1 capsule (40 mg total) by mouth every morning.  30 capsule  3  . PARoxetine (PAXIL) 20 MG tablet Take 1 tablet (20 mg total) by mouth daily.  30 tablet  2  . potassium chloride SA (K-DUR,KLOR-CON) 20 MEQ tablet Take 1-2 tablets (20-40 mEq total) by mouth 2 (two) times daily. 40 meq in the morning and 20 meq at night  90 tablet  3  . predniSONE (DELTASONE) 10 MG tablet Take 1 tablet  (10 mg total) by mouth daily. Take with food.  5 tablet  0  . sildenafil (REVATIO) 20 MG tablet Take 4 tablets (80 mg total) by mouth 3 (three) times daily. take 4  tablets every 8 hours  10 tablet  0  . Treprostinil (TYVASO) 0.6 MG/ML SOLN Inhale 18 mcg into the lungs 4 (four) times daily.  3.6 mL  11   Past Medical History  Diagnosis Date  . GERD (gastroesophageal reflux disease)   . Hypertension   . 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 CHF (congestive heart failure)     Right side- Dr. Aundra Dubin ([facility])  . Pulmonary HTN   . Anxiety   . Cardiomyopathy   . NSVT (nonsustained ventricular tachycardia)     with syncope: St Jude ICD was implanted. Device now nearing ERI. Given end stage lung disease and normalization of LV function, the device will not be replaced and tachy therapies were turned off  . Chronic chest pain     LHC (08/2008) with no angiographic coronary diease  . Allergic rhinitis   . H/O: iron deficiency anemia   . Secondary amenorrhea     irregular menses  . Psychosis     with high dose steroids  . SVT (supraventricular tachycardia)     possible runs  . Hypokalemia  recurrent  . Rotator cuff sprain   . Chronic back pain    Filed Vitals:   11/11/13 0500 11/11/13 0700 11/11/13 0716 11/11/13 1100  BP: 128/81  128/79   Pulse: 78  74   Temp: 97.7 F (36.5 C) 97.8 F (36.6 C)  98.3 F (36.8 C)  TempSrc: Oral Oral  Oral  Resp: 28  19   Height:      Weight:      SpO2: 100%  100%     Exam: Gen: Awake and alert, on Ventimask. HEENT: EOMI, PERRLA. Resp: Air entry decreased B/L with B/L fine crackles. CV: S1 S2 normal,no murmurs. RRR. Abd: Soft, NT/ND, BS+ and normal. Ext: No edema.  A/P: 53 Y/O F with  1. Dyspnea: CHF exacerbation vs worsening pulmonary HTN/sarcoidosis process.    O2 sat on RA on admission was 88%.   CXR reviewed showing Severe chronic lung disease with no acute change.     Was on Ventimask overnight, this was taken off this morning  and Sat dropped to 95% RA.     Switch to O2 via [place]. Monitor O2 requirement and titrate as needed.     On Symbicort,Duoneb. Prednisone 10 mg qd added to regimen.     Levaquin A/B prophylaxis although no signs of pulmonary infection.     For pulmonary HTN, continue home Sildenafil and Tyvaso.    Concern for CHF exacerbation with proBNP of >1500.     Appears euvolemic.     Plan to give IV lasix for 24 hrs, monitor I&Os and daily weighing.      Consider switching from Coreg to Toprol XL.  2. HTN: BP optimally controlled.     Monitor and continue current regimen.

## 2013-11-11 NOTE — Progress Notes (Signed)
Family Medicine PGY-3 PCP Note  Pt seen at bedside. She states, "I didn't plan it this way, and I'm sorry I had to come in and bother you." Reassured pt that coming in to be evaluated was part of what I had advised her, and that she wasn't "bothering" anyone. I have been and will continue to be actively involved in Rachel [name]'s in-patient and subsequent out-patient management. Please see her H&P and progress notes for details of her current management.  Emmaline Kluver, MD PGY-3, [facility] 11/11/2013, 2:31 PM First call: [place] pager: [phone] (text pages welcome through [facility]) Personal pager: [phone]

## 2013-11-11 NOTE — Progress Notes (Signed)
FMTS ATTENDING  NOTE Rachel Grandison,MD I  have seen and examined this patient, reviewed their chart. I have discussed this patient with the resident. I agree with the resident's findings, assessment and care plan. 

## 2013-11-11 NOTE — Progress Notes (Signed)
Utilization review completed

## 2013-11-11 NOTE — Progress Notes (Signed)
Pt transferred from the ED around 2200, admitted to Rm/3s04. Pt comes from with family. She is alert and oriented. Ambulatory with standby assist (genaral weakness). No skin breakdown noted. Placed on telemetry, NSR. Tyvaso neb machine at bedside. Oriented to room, instructed to call for assistance before getting out of bed. Resting comfortably at this time, will continue to monitor.

## 2013-11-11 NOTE — Progress Notes (Signed)
Family Medicine Teaching Service Daily Progress Note Intern Pager: [phone]  Patient name: Rachel [name] Medical record number: [id number] Date of birth: [birth year] Age: 53 y.o. Gender: female  Primary Care Provider: Christa See, MD Consultants: Pulmonology Code Status: FULL  Pt Overview and Major Events to Date:  08/06: no events overnight.   Assessment and Plan: 1. Acute dyspnea: DDx: PE vs CHF vs Pneumonia vs secondary to exacerbation of underlying chronic lung disease. Was hypoxic on arrival to ED (74%). Was given solumedrol and CAT x 2 with improvements of O2 saturation 95%. CXR negative for acute infiltrate. Normal WBC count but with left shift. EKG unremarkable with no signs of ischemia. No signs of volume overload on exam.  -O2 currently on nasal canula  -Duonebs q4  -Continue home prednisone 101m, Symbicort, Revatio, and Tyvaso  -Awaiting sputum and blood cultures  -troponins negative x3 -Dr BAgustina Carolioffice contacted via telephone.  Spoke with Dr MChristinia Gullyregarding care.  He recommends that we continue current regimens.  He was not able to schedule an earlier appt with Pulm but will speak with Dr BLamonte Sakaitoday regarding patient's status.  2. Hyperglycemia - Noted on BMP. No h/o DM in EMR. Likely secondary to chronic steroid use.  - CBG 129 this am - CBGs TIDAC and QHS  - SSI sensitive  - awaiting A1C results   3. CHF: (07/2013) Echo: EF 609%Grade 1 diastolic dysfunction with mild systolic dysfunction, PA systolic pressure 473ZHGD -First dose of Lasix this am -BNP 1546 (CHF vs inflammatory response.  Likely some level of CHF exacerbation) -Saline locked d/t h/o CHF. Patient does not look fluid overloaded presently.   4. Pulmonary HTN with RV failure: Moderate-Severe.  -Followed by Pulmonology Dr BLamonte Sakai-Pulm contacted  -Continuing home Revatio and Tyvaso  -O2 as above   5. GERD:  -On nexium at home, Protonix ordered for patient   6. Anxiety/Major depressive  disorder  -Continue Abilify, Paxil   7. HTN: Stable now. BP 128/79  -Coreg discontinued -Metoprolol tartrate 234mBID started   8. Headache -9/10.  Likely d/t hypoxic state. -Continue O2 per above -Tylenol PRN -Patient will have nurse call if it does not improve with Tylenol.  FEN/GI: heart healthy, Protonix PPx: Lovenox subq  Disposition: D/C pending stabilization of breathing.  Subjective:  Patient states that she is breathing much better today.  Tightness in chest is much less than last evening.  Patient does note that she continues to have a headache (9/10).      Objective: Temp:  [97.2 F (36.2 C)-98.3 F (36.8 C)] 98.3 F (36.8 C) (08/06 1100) Pulse Rate:  [74-105] 74 (08/06 0716) Resp:  [13-28] 19 (08/06 0716) BP: (102-136)/(63-87) 128/79 mmHg (08/06 0716) SpO2:  [88 %-100 %] 100 % (08/06 0716) FiO2 (%):  [50 %] 50 % (08/05 2354) Weight:  [161 lb 13.1 oz (73.4 kg)] 161 lb 13.1 oz (73.4 kg) (08/05 2205) Physical Exam: General: well nourished female, alert, sitting up in bed, ventimask in place, NAD Cardiovascular: S1S2, RRR, no murmurs or gallops Respiratory: Work of breathing has improved from yesterday but is still mildly labored.  Speaking in full sentences.  Globally diffuse crackles. Abdomen: soft, NT/ND Extremities: WWP, no edema in LE   Laboratory:  Recent Labs Lab 11/10/13 1830 11/11/13 0358  WBC 9.7 6.9  HGB 10.6* 9.9*  HCT 34.2* 32.5*  PLT 285 269    Recent Labs Lab 11/10/13 1830 11/11/13 0358  NA 144 141  K 4.7  4.6  CL 105 103  CO2 29 28  BUN 15 13  CREATININE 0.65 0.53  CALCIUM 9.4 9.4  GLUCOSE 187* 129*    Imaging/Diagnostic Tests: Dg Chest Port 1 View  11/10/2013   CLINICAL DATA:  Shortness of breath. Sarcoidosis. Pulmonary hypertension.  EXAM: PORTABLE CHEST - 1 VIEW  COMPARISON:  10/24/2013 and 09/20/2013 and 09/23/2011  FINDINGS: AICD in place. The patient has severe chronic lung disease consistent with sarcoidosis. There is  chronic enlargement of the main pulmonary arteries consistent with the history of pulmonary arterial hypertension. There is haziness in both lung bases which is essentially unchanged since the prior exam.  IMPRESSION: Severe chronic lung disease. No significant change since the prior study.   Electronically Signed   By: Rozetta Nunnery M.D.   On: 11/10/2013 19:18    Janora Norlander, DO 11/11/2013, 12:39 PM PGY-1, [place] Intern pager: [phone], text pages welcome

## 2013-11-12 LAB — BASIC METABOLIC PANEL
ANION GAP: 11 (ref 5–15)
BUN: 20 mg/dL (ref 6–23)
CALCIUM: 9.5 mg/dL (ref 8.4–10.5)
CO2: 32 mEq/L (ref 19–32)
Chloride: 99 mEq/L (ref 96–112)
Creatinine, Ser: 1.02 mg/dL (ref 0.50–1.10)
GFR calc Af Amer: 71 mL/min — ABNORMAL LOW (ref >90)
GFR calc non Af Amer: 62 mL/min — ABNORMAL LOW (ref >90)
Glucose, Bld: 114 mg/dL — ABNORMAL HIGH (ref 70–99)
Potassium: 4.6 mEq/L (ref 3.7–5.3)
SODIUM: 142 meq/L (ref 137–147)

## 2013-11-12 LAB — GLUCOSE, CAPILLARY
GLUCOSE-CAPILLARY: 90 mg/dL (ref 70–99)
GLUCOSE-CAPILLARY: 109 mg/dL — AB (ref 70–99)
GLUCOSE-CAPILLARY: 111 mg/dL — AB (ref 70–99)
Glucose-Capillary: 118 mg/dL — ABNORMAL HIGH (ref 70–99)

## 2013-11-12 LAB — EXPECTORATED SPUTUM ASSESSMENT W GRAM STAIN, RFLX TO RESP C

## 2013-11-12 LAB — EXPECTORATED SPUTUM ASSESSMENT W REFEX TO RESP CULTURE

## 2013-11-12 MED ORDER — LEVOFLOXACIN 750 MG PO TABS
750.0000 mg | ORAL_TABLET | ORAL | Status: DC
  Administered 2013-11-12: 750 mg via ORAL
  Filled 2013-11-12 (×2): qty 1

## 2013-11-12 NOTE — Progress Notes (Signed)
FMTS ATTENDING NOTE Nuel Dejaynes,MD I  have seen and examined this patient, reviewed their chart. I have discussed this patient with the resident. I agree with the resident's findings, assessment and care plan. Patient doing well, she feels she is currently at baseline, on 5 L O2, she is concern however breathing might worsen with ambulation,we will try to ambulate her today and see how she does. We will continue Duoneb, sybicort for now. Blood culture negative so far, sputum culture not specific. Now on Levaquin oral, will continue this. We will watch her over the weekend for further improvement.

## 2013-11-12 NOTE — Progress Notes (Signed)
[facility]  Patient Status: Active (receiving services up to time of hospitalization)  [facility] is providing the following services: RN and PT  If patient discharges after hours, please call [phone].   Rachel [name] 11/12/2013, 3:16 PM

## 2013-11-12 NOTE — H&P (Signed)
53 Y/O F with PMX of pulmonary sarcoidosis, anxiety, CHF, HTN,and pulmonary hypertension presented to the hospital with 3 days hx of worsening SOB. She mentioned she was recently discharged from the hospital about 1-2 wks ago for similar presentation, her symptoms never got to baseline since discharged from the hospital. She is compliant with home pulmonary regimen with no improvement of her symptoms. She endorsed cough productive of greyish thick sputum, not blood stained, endorsed chest pain with excessive coughing. SOB is worse with ambulation and laying flat in bed. In the last 3 days she has been using 2 pillows and sleeps propped up in bed. She denies fever. Sick contact was with her psychiatrist that visits her at home few days ago.Patient on 5 L O2 via [place] at home.  No current facility-administered medications on file prior to encounter.   Current Outpatient Prescriptions on File Prior to Encounter  Medication Sig Dispense Refill  . albuterol (PROAIR HFA) 108 (90 BASE) MCG/ACT inhaler Inhale 2 puffs into the lungs 4 (four) times daily.  18 g  11  . ARIPiprazole (ABILIFY) 2 MG tablet Take 1 tablet (2 mg total) by mouth every other day.  30 tablet  1  . aspirin EC 81 MG tablet Take 1 tablet (81 mg total) by mouth daily.  1 tablet  0  . budesonide-formoterol (SYMBICORT) 160-4.5 MCG/ACT inhaler Inhale 1 puff into the lungs 2 (two) times daily.  1 Inhaler  3  . carvedilol (COREG) 6.25 MG tablet Take 1 tablet (6.25 mg total) by mouth 2 (two) times daily with a meal.  60 tablet  0  . esomeprazole (NEXIUM) 40 MG capsule Take 1 capsule (40 mg total) by mouth every morning.  30 capsule  3  . PARoxetine (PAXIL) 20 MG tablet Take 1 tablet (20 mg total) by mouth daily.  30 tablet  2  . potassium chloride SA (K-DUR,KLOR-CON) 20 MEQ tablet Take 1-2 tablets (20-40 mEq total) by mouth 2 (two) times daily. 40 meq in the morning and 20 meq at night  90 tablet  3  . predniSONE (DELTASONE) 10 MG tablet Take 1 tablet  (10 mg total) by mouth daily. Take with food.  5 tablet  0  . sildenafil (REVATIO) 20 MG tablet Take 4 tablets (80 mg total) by mouth 3 (three) times daily. take 4  tablets every 8 hours  10 tablet  0  . Treprostinil (TYVASO) 0.6 MG/ML SOLN Inhale 18 mcg into the lungs 4 (four) times daily.  3.6 mL  11   Past Medical History  Diagnosis Date  . GERD (gastroesophageal reflux disease)   . Hypertension   . 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 Young  . CHF (congestive heart failure)     Right side- Dr. Mclean ([facility])  . Pulmonary HTN   . Anxiety   . Cardiomyopathy   . NSVT (nonsustained ventricular tachycardia)     with syncope: St Jude ICD was implanted. Device now nearing ERI. Given end stage lung disease and normalization of LV function, the device will not be replaced and tachy therapies were turned off  . Chronic chest pain     LHC (08/2008) with no angiographic coronary diease  . Allergic rhinitis   . H/O: iron deficiency anemia   . Secondary amenorrhea     irregular menses  . Psychosis     with high dose steroids  . SVT (supraventricular tachycardia)     possible runs  . Hypokalemia       recurrent  . Rotator cuff sprain   . Chronic back pain    Filed Vitals:   11/11/13 0500 11/11/13 0700 11/11/13 0716 11/11/13 1100  BP: 128/81  128/79   Pulse: 78  74   Temp: 97.7 F (36.5 C) 97.8 F (36.6 C)  98.3 F (36.8 C)  TempSrc: Oral Oral  Oral  Resp: 28  19   Height:      Weight:      SpO2: 100%  100%     Exam: Gen: Awake and alert, on Ventimask. HEENT: EOMI, PERRLA. Resp: Air entry decreased B/L with B/L fine crackles. CV: S1 S2 normal,no murmurs. RRR. Abd: Soft, NT/ND, BS+ and normal. Ext: No edema.  A/P: 53 Y/O F with  1. Dyspnea: CHF exacerbation vs worsening pulmonary HTN/sarcoidosis process.    O2 sat on RA on admission was 88%.   CXR reviewed showing Severe chronic lung disease with no acute change.     Was on Ventimask overnight, this was taken off this morning  and Sat dropped to 95% RA.     Switch to O2 via [place]. Monitor O2 requirement and titrate as needed.     On Symbicort,Duoneb. Prednisone 10 mg qd added to regimen.     Levaquin A/B prophylaxis although no signs of pulmonary infection.     For pulmonary HTN, continue home Sildenafil and Tyvaso.    Concern for CHF exacerbation with proBNP of >1500.     Appears euvolemic.     Plan to give IV lasix for 24 hrs, monitor I&Os and daily weighing.      Consider switching from Coreg to Toprol XL.  2. HTN: BP optimally controlled.     Monitor and continue current regimen. 

## 2013-11-12 NOTE — Progress Notes (Signed)
PHARMACIST - PHYSICIAN COMMUNICATION  CONCERNING: Antibiotic IV to Oral Route Change Policy  RECOMMENDATION: This patient is receiving Levaquin by the intravenous route.  Based on criteria approved by the Pharmacy and Therapeutics Committee, the antibiotic(s) is/are being converted to the equivalent oral dose form(s).   DESCRIPTION: These criteria include:  Patient being treated for a respiratory tract infection, urinary tract infection, cellulitis or clostridium difficile associated diarrhea if on metronidazole  The patient is not neutropenic and does not exhibit a GI malabsorption state  The patient is eating (either orally or via tube) and/or has been taking other orally administered medications for a least 24 hours  The patient is improving clinically and has a Tmax < 100.5  If you have questions about this conversion, please contact the Pharmacy Department  _0   [phone] )  [facility] _1   [phone] )  Rachel [name]  _2   [phone] )  [facility] _3   [phone] )  [facility]   Thank you, Hildred Laser, [place] D 11/12/2013 11:24 AM

## 2013-11-12 NOTE — Progress Notes (Signed)
Family Medicine Teaching Service Daily Progress Note Intern Pager: [phone]  Patient name: Rachel [name] Medical record number: [id number] Date of birth: 07/20/1960 Age: 53 y.o. Gender: female  Primary Care Provider: Christa See, MD Consultants: Pulmonology Code Status: FULL  Pt Overview and Major Events to Date:  08/06: no events overnight. 08/07: vomiting yesterday morning   Assessment and Plan: 1. Acute dyspnea/Sarcoid Exacerbation: DDx: CHF (low threshold for volume overload given patients comorbidities) vs Pneumonia (CXR shows chronic changes that may mask a pneumonia) vs secondary to exacerbation of underlying chronic lung disease. Was hypoxic on arrival to ED (74%). Was given solumedrol and CAT x 2 with improvements of O2 saturation 95% in ED. CXR negative for acute infiltrate. Normal WBC count but with left shift. EKG unremarkable with no signs of ischemia. No signs of volume overload on exam.  -O2 currently on nasal canula: 100% on 5L -Duonebs q4  -Continue home prednisone 30m, Symbicort, Revatio, and Tyvaso  -Awaiting sputum and blood culture results -troponins negative x3 -Ambulate with assistance  2. Hyperglycemia - Noted on BMP. No h/o DM in EMR. Likely secondary to chronic steroid use.  - CBG 90 this am - CBGs TIDAC and QHS  - SSI sensitive  - A1C 5.3  3. CHF: (07/2013) Echo: EF 651%Grade 1 diastolic dysfunction with mild systolic dysfunction, PA systolic pressure 470YFVC -Lasix BID -BNP 1546 (CHF vs inflammatory response.  Likely some level of CHF exacerbation) -Saline locked d/t h/o CHF. Patient does not look fluid overloaded presently.   4. Pulmonary HTN with RV failure: Moderate-Severe.  -Followed by Pulmonology Dr BLamonte Sakai Dr MChristinia Gullyrecommends that we continue current regimens. No earlier appt was able to be accommodated. -Continuing home Revatio and Tyvaso  -O2 as above   5. GERD:  -On nexium at home, Protonix ordered for patient   6.  Anxiety/Major depressive disorder  -Continue Abilify, Paxil   7. HTN: Stable now. BP 100/61 HR 78 -Coreg discontinued -Metoprolol tartrate 243mBID   8. Headache -9/10 yesterday.  Improved (7/10) today. Likely d/t hypoxic state. -Continue O2 per above -Tylenol PRN -Patient will have nurse call if it does not improve with Tylenol.  FEN/GI: heart healthy, Protonix PPx: Lovenox subq  Disposition: D/C pending stabilization of breathing.  Subjective:  Patient states that she is breathing well today.  Tightness in chest is much less than yesterday.  Patient does note that she continues to have a headache (7/10) but that it is improved.  She notes she had a mild pain in her L axillary, costal area this morning.  She believes this to be d/t how she slept last night.  Patient also reports that she has sensation of something in her throat that is annoying but not preventing her from swallowing/ breathing.  She denies change in voice, throat pain or dryness.        Objective: Temp:  [97.8 F (36.6 C)-98.3 F (36.8 C)] 97.8 F (36.6 C) (08/07 0410) Pulse Rate:  [66-110] 78 (08/07 0410) Resp:  [15-23] 15 (08/07 0410) BP: (90-113)/(48-84) 100/61 mmHg (08/07 0410) SpO2:  [90 %-98 %] 96 % (08/07 0410) Weight:  [162 lb 4.1 oz (73.6 kg)] 162 lb 4.1 oz (73.6 kg) (08/07 0500) Physical Exam: General: well nourished female, alert, sitting up in bed, nasal cannula in place, NAD Cardiovascular: S1S2, RRR, no murmurs or gallops Respiratory: Work of breathing greatly improved-normal with [place] in place.  Speaking in full sentences.  Crackles improved and most notable in  Right lower lung base. Abdomen: soft, NT/ND Extremities: WWP, no edema in LE   Laboratory:  Recent Labs Lab 11/10/13 1830 11/11/13 0358  WBC 9.7 6.9  HGB 10.6* 9.9*  HCT 34.2* 32.5*  PLT 285 269    Recent Labs Lab 11/10/13 1830 11/11/13 0358 11/12/13 0250  NA 144 141 142  K 4.7 4.6 4.6  CL 105 103 99  CO2 29 28 32  BUN  _0 CREATININE 0.65 0.53 1.02  CALCIUM 9.4 9.4 9.5  GLUCOSE 187* 129* 114*    Imaging/Diagnostic Tests: Dg Chest Port 1 View  11/10/2013   CLINICAL DATA:  Shortness of breath. Sarcoidosis. Pulmonary hypertension.  EXAM: PORTABLE CHEST - 1 VIEW  COMPARISON:  10/24/2013 and 09/20/2013 and 09/23/2011  FINDINGS: AICD in place. The patient has severe chronic lung disease consistent with sarcoidosis. There is chronic enlargement of the main pulmonary arteries consistent with the history of pulmonary arterial hypertension. There is haziness in both lung bases which is essentially unchanged since the prior exam.  IMPRESSION: Severe chronic lung disease. No significant change since the prior study.   Electronically Signed   By: Rozetta Nunnery M.D.   On: 11/10/2013 19:18    Janora Norlander, DO 11/12/2013, 7:22 AM PGY-1, [place] Intern pager: [phone], text pages welcome

## 2013-11-12 NOTE — Clinical Documentation Improvement (Signed)
  Documentation states "Dyspnea: CHF exacerbation vs worsening pulmonary HTN/sarcoidosis process". In the Coding world documentation written this way will final code to Pdx "dyspnea" which is considered nonspecific, low weighted and does not reflect the severity of illness and risk of mortality of this patient. If possible please clarify documentation to indicate the likely/possible/suspected underlying reason for admission complicated by other comorbidities. Thank you.  Possible Clinical Conditions? - Acute on chronic respiratory failure 2/2 comorbidities - Sarcoid exacerbation   - Acute on Chronic Combined CHF - Worsening pulmoary HTN - Other Condition  Supporting Information: - elevated BNP > 1500 treated with IV Lasix - Initial Sats 74% with hypoxia increased 88% with nebs and Ventimask overnight  Thank You, Ezekiel Ina ,RN Clinical Documentation Specialist:  [place] Information Management

## 2013-11-13 LAB — GLUCOSE, CAPILLARY: Glucose-Capillary: 82 mg/dL (ref 70–99)

## 2013-11-13 MED ORDER — ESOMEPRAZOLE MAGNESIUM 40 MG PO CPDR: 40.0000 mg | DELAYED_RELEASE_CAPSULE | ORAL | Status: DC

## 2013-11-13 MED ORDER — METOPROLOL TARTRATE 25 MG PO TABS: 25.0000 mg | ORAL_TABLET | Freq: Two times a day (BID) | ORAL | Status: DC

## 2013-11-13 MED ORDER — LEVOFLOXACIN 750 MG PO TABS: 750.0000 mg | ORAL_TABLET | Freq: Every day | ORAL | Status: DC

## 2013-11-13 MED ORDER — FUROSEMIDE 40 MG PO TABS: 40.0000 mg | ORAL_TABLET | Freq: Every day | ORAL | Status: DC

## 2013-11-13 NOTE — Discharge Summary (Signed)
FMTS ATTENDING  NOTE Rachel Eniola,MD I  have seen and examined this patient, reviewed their chart. I have discussed this patient with the resident. I agree with the resident's findings, assessment and care plan.

## 2013-11-13 NOTE — Progress Notes (Signed)
Per RN patient needs EMS for transport home because of oxygen. Clinical Education officer, museum (CSW) arranged EMS for transport home. RN confirmed address with patient. Patient reported that she has an oxygen tank at home that she can hookup to once EMS drops her off. Patient reported that she contacted her daughter and made her aware of D/C today. CSW attempted to contact patient's daughter but the call did not go through. Nursing is aware of above. Please reconsult if future social work needs arise. CSW signing off.   Blima Rich, [place] Weekend CSW [phone]

## 2013-11-13 NOTE — Discharge Summary (Signed)
[facility] Discharge Summary  Patient name: Rachel [name] Medical record number: [id number] Date of birth: [birth year] Age: 53 y.o. Gender: female Date of Admission: 11/10/2013  Date of Discharge: 11/13/13 Admitting Physician: Janit Pagan, MD  Primary Care Provider: Maryjean Ka, MD Consultants: none  Indication for Hospitalization: acute dyspnea  Discharge Diagnoses/Problem List:  Dyspnea (resolved) secondary to CHF +/- sarcoidosis End-stage pulmonary sarcoidosis Chronic CHF HTN GERD Major depressive disorder with psychotic features  Disposition: discharge home  Discharge Condition: stable  Discharge Exam:  BP 124/70  Pulse 61  Temp(Src) 97.7 F (36.5 C) (Oral)  Resp 15  Ht 5\' 3"  (1.6 m)  Wt 162 lb 4.1 oz (73.6 kg)  BMI 28.75 kg/m2  SpO2 98% General: well nourished female, alert, sitting up in bed, nasal cannula in place on home 4L [place], in NAD  Cardiovascular: S1S2, RRR, no murmurs or gallops  Respiratory: Work of breathing greatly improved-normal with [place] in place. Speaking in full sentences. Crackles improved and most notable in Right lower lung base.  Abdomen: soft, NT/ND  Extremities: WWP, no edema in LE   Brief Hospital Course: October Matley is a 53yo female with complex medical history including end-stage sarcoidosis on chronic 4-6L by [place] O2 at home, CHF, pulmonary HTN, GERD, and mixed anxiety / depression with psychotic features (especially with high-dose steroids in the past) who presented with acute on chronic dyspnea, similar to prior recent admission. She was given Solu-Medrol and CAT in the ED with improvement in O2 sats and CXR did not show a frank pneumonia. She improved over the next few days with a course of low-dose PO steroids, Levaquin, and diuresis with Lasix BID. At time of discharge, she has returned to her baseline lung function with stable vital signs, though she does have some chronic headache. Please see below for details  by problem list.  1. Acute dyspnea/Sarcoid Exacerbation: broad differential, but very likely multifactorial, with components of CHF, sarcoidosis, +/- underlying PNA (chronic changes on CXR without frank infiltrates and no WBC elevation); troponins were negative x3. Pt was treated with Solu-Medrol and CAT in the ED as well as 4 days of Levaquin during this hospitalization as well as prednisone 10 mg for 3 days - Continued home O2 chronic 4-6L at home as well as Symbicort, Revatio, and Tyvaso  - Instructed to complete 5-day burst of prednisone (only 10 mg daily due to hx of psychosis with high doses) - Pt to continue current home health services at discharge and to follow up closely with PCP for med adjustments  2. Hyperglycemia - Noted on BMP on admission; no h/o DM, felt likely secondary to steroid use - A1C 5.3, pt covered with SSI while admitted  3. CHF: Echo from April 2015 showed EF 60% Grade 1 diastolic dysfunction with mild systolic dysfunction, PA systolic pressure  -admit pro-BNP 1546 (felt related to CHF and/or inflammatory response but likely some component of CHF exacerbation)  - pt managed with Lasix BID while admitted with instructions to continue daily for 1 week after discharge  4. Pulmonary HTN with RV failure: Moderate-Severe.  -Followed by Pulmonology Dr Delton Coombes; phone discussion with Dr Sandrea Hughs with recommendation that we continue current regimens. No earlier appt was able to be accommodated.  -Continuing home Revatio and Tyvaso and chronic O2 as above; pt to f/u with Dr. Delton Coombes later this month  5. GERD: refilled home Nexium at discharge  6. Anxiety/Major depressive disorder - no current active symptoms;  continued Abilify, Paxil   7. HTN: some intermittent fluctuations through hospitalization - Coreg discontinued this hospitalization given lung disease, favoring metoprolol for selective cardiac effect - continue metoprolol tartrate 25mg  BID at discharge  8.  Headache - chronic and intermittently, usually managed with Tylenol at home; worse with hypoxia - continue home O2, Tylenol PRN  - will need to discuss with PCP at follow-up  Issues for Follow Up:  1. Breathing - ensure good improvement in breathing and completion of steroid burst; consider daily steroid until f/u with pulm later this month. Continue Lasix x1 week daily, at least, then consider PRN or every other day.  2. Chronic issues - ensure good f/u with specialists, therapy. Continue to encourage compliance with medications.  Significant Procedures: none  Significant Labs and Imaging:   Recent Labs Lab 11/10/13 1830 11/11/13 0358  WBC 9.7 6.9  HGB 10.6* 9.9*  HCT 34.2* 32.5*  PLT 285 269    Recent Labs Lab 11/10/13 1830 11/11/13 0358 11/12/13 0250  NA 144 141 142  K 4.7 4.6 4.6  CL 105 103 99  CO2 29 28 32  GLUCOSE 187* 129* 114*  BUN 15 13 20   CREATININE 0.65 0.53 1.02  CALCIUM 9.4 9.4 9.5    Recent Labs Lab 11/10/13 1830 11/10/13 2310 11/11/13 0358 11/11/13 1010  TROPONINI <0.30 <0.30 <0.30 <0.30   Hb A1c 8/6: 5.3 Pro-BNP 8/5: 1546  Micro: Blood culture 8/5: NGTD, final pending  Results/Tests Pending at Time of Discharge: final blood culture results  Discharge Medications:    Medication List    STOP taking these medications       carvedilol 6.25 MG tablet  Commonly known as:  COREG      TAKE these medications       albuterol 108 (90 BASE) MCG/ACT inhaler  Commonly known as:  PROAIR HFA  Inhale 2 puffs into the lungs 4 (four) times daily.     ARIPiprazole 2 MG tablet  Commonly known as:  ABILIFY  Take 1 tablet (2 mg total) by mouth every other day.     aspirin EC 81 MG tablet  Take 1 tablet (81 mg total) by mouth daily.     budesonide-formoterol 160-4.5 MCG/ACT inhaler  Commonly known as:  SYMBICORT  Inhale 1 puff into the lungs 2 (two) times daily.     esomeprazole 40 MG capsule  Commonly known as:  NEXIUM  Take 1 capsule (40  mg total) by mouth every morning.     furosemide 40 MG tablet  Commonly known as:  LASIX  Take 1 tablet (40 mg total) by mouth daily.     levofloxacin 750 MG tablet  Commonly known as:  LEVAQUIN  Take 1 tablet (750 mg total) by mouth daily. One tablet a day starting 8/8     metoprolol tartrate 25 MG tablet  Commonly known as:  LOPRESSOR  Take 1 tablet (25 mg total) by mouth 2 (two) times daily.     PARoxetine 20 MG tablet  Commonly known as:  PAXIL  Take 1 tablet (20 mg total) by mouth daily.     potassium chloride SA 20 MEQ tablet  Commonly known as:  K-DUR,KLOR-CON  Take 1-2 tablets (20-40 mEq total) by mouth 2 (two) times daily. 40 meq in the morning and 20 meq at night     predniSONE 10 MG tablet  Commonly known as:  DELTASONE  Take 1 tablet (10 mg total) by mouth daily. Take with food.  sildenafil 20 MG tablet  Commonly known as:  REVATIO  Take 4 tablets (80 mg total) by mouth 3 (three) times daily. take 4  tablets every 8 hours     Treprostinil 0.6 MG/ML Soln  Commonly known as:  TYVASO  Inhale 18 mcg into the lungs 4 (four) times daily.        Discharge Instructions: Please refer to Patient Instructions section of EMR for full details.  Patient was counseled important signs and symptoms that should prompt return to medical care, changes in medications, dietary instructions, activity restrictions, and follow up appointments.   Follow-Up Appointments: Follow-up Information   Follow up with Jowanda Heeg, Cristal Deer, MD In 1 week. (for hospital follow-up)    Specialty:  Family Medicine   Contact information:   [address] [place] [address] [phone]      Bobbye Morton, MD 11/13/2013, 11:04 AM PGY-3, [facility]

## 2013-11-13 NOTE — Progress Notes (Signed)
PT note Pt ambulating with nursing with supervision.  No balance issues per nursing.  Pt has all equipment that would possibly be needed at home as well.  No further PT needs at this time and no HHPT needs either as pt feels she is at baseline.  Will not evaluate.  Pt to d/c today.  Thanks. [facility] [phone] (pager)

## 2013-11-13 NOTE — Discharge Instructions (Signed)
You were admitted for shortness of breath we think related to your heart failure and sarcoidosis. We adjusted a few of your medicines and started you on an antibiotic (Levaquin, also called levofloxacin).  START taking Levaquin, one 750 mg tablet once a day, starting Sunday 8/9, until the prescription is finished (6 more days). STOP taking Coreg. We changed this to a similar medicine, metoprolol, 25 mg twice a day. KEEP taking prednisone 10 mg a day, but only for 2 more days (Sunday 8/9 and Monday 8/10). KEEP taking Lasix (furosemide) once a day for the next week, then Dr. Venetia Maxon will help you re-adjust it. You should take your other medicines without any changes.  Make sure you follow up with Dr. Venetia Maxon and with your lung doctor. If you have any questions or concerns, you can call [phone] at any time.

## 2013-11-17 LAB — CULTURE, BLOOD (ROUTINE X 2)
CULTURE: NO GROWTH
Culture: NO GROWTH

## 2013-11-19 ENCOUNTER — Telehealth: Payer: Self-pay | Admitting: Family Medicine

## 2013-11-19 NOTE — Telephone Encounter (Signed)
Pt called and wanted the doctor to know that she wasn't able to make an appointment until 9/16 because the doctor schedule is full. She wanted Dr. Venetia Maxon to call her to go over her lasix. jw

## 2013-11-19 NOTE — Telephone Encounter (Signed)
Called pt to discuss her Lasix dosing. She has been taking it daily since her discharge and feels her LE swelling is improved, as is her breathing. Prior to that hospitalization, she was taking Lasix every other day. She states she feels that she urinates more often (stating "I know that's what it's supposed to do") when she takes it daily, but not when she takes it every other day.  Discussed options with patient. Decided that she should resume every-other-day dosing of Lasix, but that if she feels her swelling or breathing is worse, that she should take the dose of Lasix on her off days as well. Pt is to follow up with me next month. Instructed her to call / schedule an acute visit with someone else if she feels the need, in the interim. Pt voiced understanding and appreciation. --CMS

## 2013-11-25 ENCOUNTER — Encounter (HOSPITAL_COMMUNITY): Payer: Self-pay | Admitting: Emergency Medicine

## 2013-11-25 ENCOUNTER — Emergency Department (HOSPITAL_COMMUNITY): Payer: Medicare Other

## 2013-11-25 ENCOUNTER — Inpatient Hospital Stay (HOSPITAL_COMMUNITY)
Admission: EM | Admit: 2013-11-25 | Discharge: 2013-11-29 | DRG: 196 | Disposition: A | Discharge status: Home / Self Care | Payer: Medicare Other | Attending: Family Medicine | Admitting: Family Medicine

## 2013-11-25 ENCOUNTER — Encounter (HOSPITAL_COMMUNITY): Payer: Self-pay

## 2013-11-25 ENCOUNTER — Ambulatory Visit (HOSPITAL_BASED_OUTPATIENT_CLINIC_OR_DEPARTMENT_OTHER)
Admission: RE | Admit: 2013-11-25 | Discharge: 2013-11-25 | Disposition: A | Discharge status: Home / Self Care | Payer: Medicare Other | Source: Ambulatory Visit | Attending: Internal Medicine | Admitting: Internal Medicine

## 2013-11-25 VITALS — BP 114/68 | HR 77 | Wt 163.5 lb

## 2013-11-25 DIAGNOSIS — T465X5A Adverse effect of other antihypertensive drugs, initial encounter: Secondary | ICD-10-CM | POA: Diagnosis present

## 2013-11-25 DIAGNOSIS — Z886 Allergy status to analgesic agent status: Secondary | ICD-10-CM | POA: Diagnosis not present

## 2013-11-25 DIAGNOSIS — F411 Generalized anxiety disorder: Secondary | ICD-10-CM | POA: Diagnosis present

## 2013-11-25 DIAGNOSIS — K219 Gastro-esophageal reflux disease without esophagitis: Secondary | ICD-10-CM | POA: Diagnosis present

## 2013-11-25 DIAGNOSIS — R0602 Shortness of breath: Secondary | ICD-10-CM

## 2013-11-25 DIAGNOSIS — I5032 Chronic diastolic (congestive) heart failure: Secondary | ICD-10-CM | POA: Diagnosis not present

## 2013-11-25 DIAGNOSIS — I10 Essential (primary) hypertension: Secondary | ICD-10-CM | POA: Diagnosis present

## 2013-11-25 DIAGNOSIS — Z9981 Dependence on supplemental oxygen: Secondary | ICD-10-CM | POA: Diagnosis not present

## 2013-11-25 DIAGNOSIS — D649 Anemia, unspecified: Secondary | ICD-10-CM | POA: Diagnosis present

## 2013-11-25 DIAGNOSIS — I959 Hypotension, unspecified: Secondary | ICD-10-CM | POA: Diagnosis present

## 2013-11-25 DIAGNOSIS — Z87891 Personal history of nicotine dependence: Secondary | ICD-10-CM | POA: Diagnosis not present

## 2013-11-25 DIAGNOSIS — I272 Pulmonary hypertension, unspecified: Secondary | ICD-10-CM

## 2013-11-25 DIAGNOSIS — Z8249 Family history of ischemic heart disease and other diseases of the circulatory system: Secondary | ICD-10-CM | POA: Diagnosis not present

## 2013-11-25 DIAGNOSIS — D869 Sarcoidosis, unspecified: Secondary | ICD-10-CM

## 2013-11-25 DIAGNOSIS — Z7982 Long term (current) use of aspirin: Secondary | ICD-10-CM | POA: Diagnosis not present

## 2013-11-25 DIAGNOSIS — Z801 Family history of malignant neoplasm of trachea, bronchus and lung: Secondary | ICD-10-CM | POA: Diagnosis not present

## 2013-11-25 DIAGNOSIS — I2789 Other specified pulmonary heart diseases: Secondary | ICD-10-CM

## 2013-11-25 DIAGNOSIS — J8410 Pulmonary fibrosis, unspecified: Secondary | ICD-10-CM | POA: Diagnosis present

## 2013-11-25 DIAGNOSIS — I509 Heart failure, unspecified: Secondary | ICD-10-CM | POA: Diagnosis present

## 2013-11-25 DIAGNOSIS — Z885 Allergy status to narcotic agent status: Secondary | ICD-10-CM

## 2013-11-25 DIAGNOSIS — F323 Major depressive disorder, single episode, severe with psychotic features: Secondary | ICD-10-CM | POA: Diagnosis present

## 2013-11-25 DIAGNOSIS — Z833 Family history of diabetes mellitus: Secondary | ICD-10-CM

## 2013-11-25 DIAGNOSIS — Z91040 Latex allergy status: Secondary | ICD-10-CM | POA: Diagnosis not present

## 2013-11-25 DIAGNOSIS — R0600 Dyspnea, unspecified: Secondary | ICD-10-CM

## 2013-11-25 DIAGNOSIS — J99 Respiratory disorders in diseases classified elsewhere: Secondary | ICD-10-CM | POA: Diagnosis present

## 2013-11-25 DIAGNOSIS — J9621 Acute and chronic respiratory failure with hypoxia: Secondary | ICD-10-CM

## 2013-11-25 DIAGNOSIS — J9620 Acute and chronic respiratory failure, unspecified whether with hypoxia or hypercapnia: Secondary | ICD-10-CM | POA: Diagnosis not present

## 2013-11-25 DIAGNOSIS — I5033 Acute on chronic diastolic (congestive) heart failure: Secondary | ICD-10-CM

## 2013-11-25 DIAGNOSIS — I428 Other cardiomyopathies: Secondary | ICD-10-CM | POA: Diagnosis present

## 2013-11-25 DIAGNOSIS — R0902 Hypoxemia: Secondary | ICD-10-CM | POA: Diagnosis present

## 2013-11-25 LAB — CBC WITH DIFFERENTIAL/PLATELET
BASOS ABS: 0.1 10*3/uL (ref 0.0–0.1)
Basophils Relative: 1 % (ref 0–1)
Eosinophils Absolute: 0.5 10*3/uL (ref 0.0–0.7)
Eosinophils Relative: 5 % (ref 0–5)
HCT: 32.2 % — ABNORMAL LOW (ref 36.0–46.0)
Hemoglobin: 10.2 g/dL — ABNORMAL LOW (ref 12.0–15.0)
LYMPHS PCT: 21 % (ref 12–46)
Lymphs Abs: 2.2 10*3/uL (ref 0.7–4.0)
MCH: 28.7 pg (ref 26.0–34.0)
MCHC: 31.7 g/dL (ref 30.0–36.0)
MCV: 90.7 fL (ref 78.0–100.0)
Monocytes Absolute: 0.6 10*3/uL (ref 0.1–1.0)
Monocytes Relative: 5 % (ref 3–12)
NEUTROS ABS: 7 10*3/uL (ref 1.7–7.7)
Neutrophils Relative %: 68 % (ref 43–77)
PLATELETS: 258 10*3/uL (ref 150–400)
RBC: 3.55 MIL/uL — ABNORMAL LOW (ref 3.87–5.11)
RDW: 14.5 % (ref 11.5–15.5)
WBC: 10.3 10*3/uL (ref 4.0–10.5)

## 2013-11-25 LAB — I-STAT CHEM 8, ED
BUN: 19 mg/dL (ref 6–23)
CHLORIDE: 105 meq/L (ref 96–112)
Calcium, Ion: 1.19 mmol/L (ref 1.12–1.23)
Creatinine, Ser: 0.7 mg/dL (ref 0.50–1.10)
Glucose, Bld: 88 mg/dL (ref 70–99)
HCT: 33 % — ABNORMAL LOW (ref 36.0–46.0)
Hemoglobin: 11.2 g/dL — ABNORMAL LOW (ref 12.0–15.0)
POTASSIUM: 4.3 meq/L (ref 3.7–5.3)
SODIUM: 140 meq/L (ref 137–147)
TCO2: 32 mmol/L (ref 0–100)

## 2013-11-25 LAB — I-STAT ARTERIAL BLOOD GAS, ED
Acid-Base Excess: 6 mmol/L — ABNORMAL HIGH (ref 0.0–2.0)
Bicarbonate: 31.5 mEq/L — ABNORMAL HIGH (ref 20.0–24.0)
O2 Saturation: 98 %
PH ART: 7.408 (ref 7.350–7.450)
Patient temperature: 99.2
TCO2: 33 mmol/L (ref 0–100)
pCO2 arterial: 50 mmHg — ABNORMAL HIGH (ref 35.0–45.0)
pO2, Arterial: 116 mmHg — ABNORMAL HIGH (ref 80.0–100.0)

## 2013-11-25 LAB — D-DIMER, QUANTITATIVE (NOT AT ARMC): D DIMER QUANT: 0.51 ug{FEU}/mL — AB (ref 0.00–0.48)

## 2013-11-25 LAB — I-STAT CG4 LACTIC ACID, ED: LACTIC ACID, VENOUS: 0.56 mmol/L (ref 0.5–2.2)

## 2013-11-25 LAB — I-STAT TROPONIN, ED: Troponin i, poc: 0 ng/mL (ref 0.00–0.08)

## 2013-11-25 MED ORDER — PANTOPRAZOLE SODIUM 40 MG PO TBEC
80.0000 mg | DELAYED_RELEASE_TABLET | Freq: Every day | ORAL | Status: DC
  Administered 2013-11-26 – 2013-11-29 (×4): 80 mg via ORAL
  Filled 2013-11-25 (×4): qty 2

## 2013-11-25 MED ORDER — SILDENAFIL CITRATE 20 MG PO TABS
80.0000 mg | ORAL_TABLET | Freq: Three times a day (TID) | ORAL | Status: DC
  Administered 2013-11-25 – 2013-11-29 (×12): 80 mg via ORAL
  Filled 2013-11-25 (×14): qty 4

## 2013-11-25 MED ORDER — BUDESONIDE-FORMOTEROL FUMARATE 160-4.5 MCG/ACT IN AERO
1.0000 | INHALATION_SPRAY | Freq: Two times a day (BID) | RESPIRATORY_TRACT | Status: DC
  Administered 2013-11-26 – 2013-11-29 (×7): 1 via RESPIRATORY_TRACT
  Filled 2013-11-25: qty 6

## 2013-11-25 MED ORDER — SODIUM CHLORIDE 0.9 % IJ SOLN
3.0000 mL | Freq: Two times a day (BID) | INTRAMUSCULAR | Status: DC
  Administered 2013-11-25 – 2013-11-28 (×4): 3 mL via INTRAVENOUS

## 2013-11-25 MED ORDER — ACETAMINOPHEN 650 MG RE SUPP: 650.0000 mg | Freq: Four times a day (QID) | RECTAL | Status: DC | PRN

## 2013-11-25 MED ORDER — POTASSIUM CHLORIDE CRYS ER 20 MEQ PO TBCR
20.0000 meq | EXTENDED_RELEASE_TABLET | Freq: Every day | ORAL | Status: DC
  Administered 2013-11-25 – 2013-11-28 (×4): 20 meq via ORAL
  Filled 2013-11-25 (×5): qty 1

## 2013-11-25 MED ORDER — ALBUTEROL SULFATE (2.5 MG/3ML) 0.083% IN NEBU
2.5000 mg | INHALATION_SOLUTION | RESPIRATORY_TRACT | Status: DC
  Administered 2013-11-25: 2.5 mg via RESPIRATORY_TRACT
  Filled 2013-11-25: qty 3

## 2013-11-25 MED ORDER — METOPROLOL TARTRATE 25 MG PO TABS
25.0000 mg | ORAL_TABLET | Freq: Two times a day (BID) | ORAL | Status: DC
  Administered 2013-11-25 – 2013-11-27 (×3): 25 mg via ORAL
  Filled 2013-11-25 (×7): qty 1

## 2013-11-25 MED ORDER — ALBUTEROL SULFATE (2.5 MG/3ML) 0.083% IN NEBU
5.0000 mg | INHALATION_SOLUTION | Freq: Once | RESPIRATORY_TRACT | Status: AC
  Administered 2013-11-25: 5 mg via RESPIRATORY_TRACT
  Filled 2013-11-25: qty 6

## 2013-11-25 MED ORDER — ASPIRIN EC 81 MG PO TBEC
81.0000 mg | DELAYED_RELEASE_TABLET | Freq: Every day | ORAL | Status: DC
  Administered 2013-11-26 – 2013-11-29 (×4): 81 mg via ORAL
  Filled 2013-11-25 (×4): qty 1

## 2013-11-25 MED ORDER — ACETAMINOPHEN 325 MG PO TABS
650.0000 mg | ORAL_TABLET | Freq: Four times a day (QID) | ORAL | Status: DC | PRN
  Administered 2013-11-27: 650 mg via ORAL
  Filled 2013-11-25: qty 2

## 2013-11-25 MED ORDER — SODIUM CHLORIDE 0.9 % IJ SOLN
3.0000 mL | Freq: Two times a day (BID) | INTRAMUSCULAR | Status: DC
  Administered 2013-11-26 – 2013-11-28 (×4): 3 mL via INTRAVENOUS

## 2013-11-25 MED ORDER — ARIPIPRAZOLE 2 MG PO TABS
2.0000 mg | ORAL_TABLET | ORAL | Status: DC
  Administered 2013-11-26 – 2013-11-28 (×2): 2 mg via ORAL
  Filled 2013-11-25 (×3): qty 1

## 2013-11-25 MED ORDER — SODIUM CHLORIDE 0.9 % IJ SOLN: 3.0000 mL | INTRAMUSCULAR | Status: DC | PRN

## 2013-11-25 MED ORDER — POTASSIUM CHLORIDE CRYS ER 20 MEQ PO TBCR
40.0000 meq | EXTENDED_RELEASE_TABLET | Freq: Every day | ORAL | Status: DC
  Administered 2013-11-26 – 2013-11-29 (×4): 40 meq via ORAL
  Filled 2013-11-25 (×4): qty 2

## 2013-11-25 MED ORDER — ONDANSETRON HCL 4 MG/2ML IJ SOLN: 4.0000 mg | Freq: Four times a day (QID) | INTRAMUSCULAR | Status: DC | PRN

## 2013-11-25 MED ORDER — SODIUM CHLORIDE 0.9 % IV SOLN: 250.0000 mL | INTRAVENOUS | Status: DC | PRN

## 2013-11-25 MED ORDER — HEPARIN SODIUM (PORCINE) 5000 UNIT/ML IJ SOLN
5000.0000 [IU] | Freq: Three times a day (TID) | INTRAMUSCULAR | Status: DC
  Administered 2013-11-26: 5000 [IU] via SUBCUTANEOUS
  Filled 2013-11-25 (×4): qty 1

## 2013-11-25 MED ORDER — TREPROSTINIL 0.6 MG/ML IN SOLN
18.0000 ug | Freq: Four times a day (QID) | RESPIRATORY_TRACT | Status: DC
  Administered 2013-11-26 – 2013-11-29 (×14): 18 ug via RESPIRATORY_TRACT

## 2013-11-25 MED ORDER — PAROXETINE HCL 20 MG PO TABS
20.0000 mg | ORAL_TABLET | Freq: Every day | ORAL | Status: DC
  Administered 2013-11-26 – 2013-11-29 (×4): 20 mg via ORAL
  Filled 2013-11-25 (×4): qty 1

## 2013-11-25 MED ORDER — ACETAMINOPHEN 325 MG PO TABS
975.0000 mg | ORAL_TABLET | Freq: Once | ORAL | Status: AC
  Administered 2013-11-25: 975 mg via ORAL
  Filled 2013-11-25: qty 3

## 2013-11-25 MED ORDER — ONDANSETRON HCL 4 MG PO TABS: 4.0000 mg | ORAL_TABLET | Freq: Four times a day (QID) | ORAL | Status: DC | PRN

## 2013-11-25 MED ORDER — PREDNISONE 10 MG PO TABS
10.0000 mg | ORAL_TABLET | Freq: Every day | ORAL | Status: DC
  Administered 2013-11-25 – 2013-11-29 (×4): 10 mg via ORAL
  Filled 2013-11-25 (×5): qty 1

## 2013-11-25 NOTE — ED Notes (Signed)
Dr. Winfred Leeds at bedside

## 2013-11-25 NOTE — ED Notes (Signed)
Pt coming from HF clinic, reporting sats 60% at 4L, currently is 91% on 6L. Denies CP, reports is SOB. Pt is a x 4, speaking in clear sentences. Dr. Algernon Huxley sent pt here for admission.

## 2013-11-25 NOTE — ED Notes (Signed)
Attempted IV, unsuccessful. IV team paged

## 2013-11-25 NOTE — H&P (Signed)
Rachel [name] Service Pager: [phone]  Patient name: Rachel [name] Medical record number: [id number] Date of birth: 1961/01/09 Age: 53 y.o. Gender: female  Primary Care Provider: Christa See, MD Consultants: pulmonology Code Status: FULL  Chief Complaint: hypoxia  Assessment and Plan: Rachel [name] is a 53 y.o. female presenting with hypoxia. PMH is significant for pulmonary sarcoidosis, pulmonary hypertension, cardiomyopathy, CHF, SVT, major depressive disorder with psychotic features, HTN  #Hypoxia: 64% at CHF clinic today, pt seen by Dr. Aundra Dubin and sent to ED for evaluation.  Patients current oxygen saturation 100% on ventimask, 55%.  DDx Bronchitis vs Pneumonia (empirically treated last admission with Levaquin) vs worsening  underlying lung disease vs CHF (no evidence of fluid overload) vs PE.  Of note, patient was hospitalized on 11/10/13 for acute dyspnea.  08/20: CXR negative for acute abnormalities.  Hypoxia does not appear infectious with WBC 10.3 and no infiltrates on cxr or from fluid overload by exam and cxr.   -Admit to step down under Dr Gwendlyn Deutscher -Continuous pulse ox -Currently on ventimask 6L @ 55%.  Respiratory to wean if able. If worsens will need to to obtain ABG and possibly start Bipap. -Continue home symbicort, revatio, tyvaso - prednisone 10 mg  - Pulmonology had seen her in ED, they verbalized they would follow. If not seen in morning, may need to re-consult.  - elevated D-Dimer (0.51). Unable to obtain CTA d/t IV placement in left External Jugular. VQ scan ordered. Will not be completed until the morning. No S1Q3T3 on ekg.  - Home CPAP QHS if able to maintain O2 sats, if unable may need bipap  #CHF: Echo: EF 79% Grade 1 diastolic dysfxn w/ mild dysolic dysfxn, PA systolic pressure 89QJJH.  Does not appear fluid overloaded on exam. -Patient on lasix 34m QD at home.  Will increase to Lasix 40 BID during  stay -daily BMETs -Continue home dose K -SL IV. Patient is PO- no need for extra fluids given h/o CHF.  #Pulmonary HTN w/ RV failure: Moderate to severe -Followed by Dr BLamonte Sakai(pulm) -Cont revatio and tyvaso -O2 therapy as above  #GERD -Takes nexium at home, Protonix during stay  #Anxiety/Major depressive disorder -Continue home Abilify and paxil  #HTN: BP 122/67 HR 66. Stable -Continue home Metoprolol 25 BID  FEN/GI: SL IV, Protonix, Heart healthy diet Prophylaxis: Sub-q heparin  Disposition: Admit to step down unit for O2 requirements.  Discharge to home pending pulmonary evaluation and recs and stabilization of O2.  History of Present Illness: Rachel BASCOMBis a 53y.o. female presenting with hypoxia and fatigue. Patient presented to the ED this afternoon, after her appointment in the CHF clinic d/ t hypoxia with saturations of 64% while on 4L oxygen. Patient reports she typically uses 4L of O2 at home unless she is being active and then she increases o2 levels to 5-6L. She has been having to leave her O2 on 6L most days since her discharge home from the hospital. Patient reports that she continues to have a productive cough (rust colored sputum) that is unchanged from last admission. The amount of sputum is unchanged as well.  Admits to night sweats that are waking her up, as well as chills.  She reports that she's finished the Levaquin and Prednisone that she was given on discharge but never felt better. Patient also states that her weight has been fluctuating, with a gain of 3 lbs since discharge, that she believes may  have been fluid. She states lasix use has improved her lower extremity edema. She is toleratin food and liquid. Reports that her activities have significantly decreased because she is so fatigued.  Admits to chest tightness with occasional sharp pains without radiation located center of chest, last being two days ago. Pain occurred when she was laying down, was not  necessarily associated with deep breaths, activity or movement that she recalls.   On exam, patient states that her abdomen has been bothering her.  Denies constipation, admits to loose stool that is new to her since leaving hospital.  Review Of Systems: Per HPI with the following additions: none Otherwise 12 point review of systems was performed and was unremarkable.  Patient Active Problem List   Diagnosis Date Noted  . Dyspnea 11/10/2013  . Spinal stenosis of lumbar region 10/27/2013  . Abnormal involuntary movements 10/26/2013  . Shortness of breath 10/24/2013  . Major depressive disorder with psychotic features 08/31/2013  . Loss of weight 08/12/2013  . SVT (supraventricular tachycardia) 08/05/2013  . CONGESTIVE HEART FAILURE, RIGHT 09/21/2008  . PULMONARY HYPERTENSION 09/05/2008  . PULMONARY SARCOIDOSIS 05/03/2008  . ANXIETY 06/05/2006  . HYPERTENSION, BENIGN SYSTEMIC 06/05/2006  . CARDIOMYOPATHY, IDIOPATHIC 06/05/2006  . GASTROESOPHAGEAL REFLUX, NO ESOPHAGITIS 06/05/2006   Past Medical History: Past Medical History  Diagnosis Date  . GERD (gastroesophageal reflux disease)   . Hypertension   . 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 CHF (congestive heart failure)     Right side- Dr. Aundra Dubin ([facility])  . Pulmonary HTN   . Anxiety   . Cardiomyopathy   . NSVT (nonsustained ventricular tachycardia)     with syncope: St Jude ICD was implanted. Device now nearing ERI. Given end stage lung disease and normalization of LV function, the device will not be replaced and tachy therapies were turned off  . Chronic chest pain     LHC (08/2008) with no angiographic coronary diease  . Allergic rhinitis   . H/O: iron deficiency anemia   . Secondary amenorrhea     irregular menses  . Psychosis     with high dose steroids  . SVT (supraventricular tachycardia)     possible runs  . Hypokalemia     recurrent  . Rotator cuff sprain   . Chronic back pain    Past Surgical  History: Past Surgical History  Procedure Laterality Date  . Cardiac catheterization  08/26/2008    5% EF with normal coronary arteries and normal wall motion  . Adenosine cardiolyte  03/31/2003    no ischemia/infarct; marked global LV hypekinesis; resting EF 22%  . Cardiac catheterization  03/31/2003    globally depressed LV fxn with EF 20%; idiopathic dilated cardiomyopathy  . Defibrillator placed  03/31/2003    St. Jude single chamber (Dr. Cristopher Peru)  . Pfts      FVC 2000 (56%) FEV1 1400 (47%), ratio 0.7; insignif response to bronchodilatior; stable from 1/05 - 9/05; PFTs: Mod restriction, FVC 1.9L, FEV1 1.6L, both 53% predicted; slt response to brochodilators 04/08/2002   Social History: History  Substance Use Topics  . Smoking status: Former Smoker -- 1.50 packs/day for 11 years    Types: Cigarettes    Quit date: 04/08/1992  . Smokeless tobacco: Never Used     Comment: 1ppd x 20 years  . Alcohol Use: Yes     Comment: remote history of alcohol abuse (quit 1994)   Additional social history: none  Please also refer to  relevant sections of EMR.  Family History: Family History  Problem Relation Age of Onset  . Lung cancer Father   . Cancer Father     lung cancer  . Hypertension    . Diabetes Mother     border line  . Hypertension Mother   . Heart failure Mother    Allergies and Medications: Allergies  Allergen Reactions  . Latex Rash  . Nitroglycerin Other (See Comments)    Patient is on Revatio  . Other Other (See Comments)    High Dose Steroids cause chemical imbalance and altered mental status  . Meperidine Hcl Other (See Comments)    REACTION: Makes her feel funny  . Tramadol Hcl Other (See Comments)    REACTION: nausea, lightheadedness, sleepiness, and dizziness  . Ace Inhibitors Other (See Comments)    unknown  . Morphine Other (See Comments)    Other   . Propoxyphene N-Acetaminophen Rash   No current facility-administered medications on file prior to  encounter.   Current Outpatient Prescriptions on File Prior to Encounter  Medication Sig Dispense Refill  . albuterol (PROAIR HFA) 108 (90 BASE) MCG/ACT inhaler Inhale 2 puffs into the lungs 4 (four) times daily.  18 g  11  . ARIPiprazole (ABILIFY) 2 MG tablet Take 1 tablet (2 mg total) by mouth every other day.  30 tablet  1  . aspirin EC 81 MG tablet Take 1 tablet (81 mg total) by mouth daily.  1 tablet  0  . budesonide-formoterol (SYMBICORT) 160-4.5 MCG/ACT inhaler Inhale 1 puff into the lungs 2 (two) times daily.  1 Inhaler  3  . PARoxetine (PAXIL) 20 MG tablet Take 1 tablet (20 mg total) by mouth daily.  30 tablet  2  . potassium chloride SA (K-DUR,KLOR-CON) 20 MEQ tablet Take 1-2 tablets (20-40 mEq total) by mouth 2 (two) times daily. 40 meq in the morning and 20 meq at night  90 tablet  3  . sildenafil (REVATIO) 20 MG tablet Take 4 tablets (80 mg total) by mouth 3 (three) times daily. take 4  tablets every 8 hours  10 tablet  0  . Treprostinil (TYVASO) 0.6 MG/ML SOLN Inhale 18 mcg into the lungs 4 (four) times daily.  3.6 mL  11    Objective: BP 121/71  Pulse 64  Temp(Src) 99.2 F (37.3 C) (Rectal)  Resp 18  Wt 160 lb 8 oz (72.802 kg)  SpO2 97% Exam: General: AA female sitting up in bed, ventimask in place, NAD, pleasant, non-toxic in appearance HEENT: AT/[place], Bilateral eyes without injections or icterus, MMM Cardiovascular: S1S2, RRR, no murmurs, rubs, gallops Respiratory: globally diffuse crackles R>L, no wheeze.  Abdomen: obese, soft, ND, TTP RLQ/LLQ, +BS, no masses appreciated Extremities: WWP, no edema Skin: dry, intact, no rashes  Neuro: alert, oriented, PERLA, EOMi, moves extremities spontaneously  Labs and Imaging: CBC BMET   Recent Labs Lab 11/25/13 1800  WBC 10.3  HGB 10.2*  HCT 32.2*  PLT 258    Recent Labs Lab 11/25/13 1744  NA 140  K 4.3  CL 105  BUN 19  CREATININE 0.70  GLUCOSE 88     Dg Chest Portable 1 View  11/25/2013   CLINICAL DATA:   Shortness of breath.  EXAM: PORTABLE CHEST - 1 VIEW  COMPARISON:  Chest x-ray 11/10/2013.  , 08/21/2013, 09/23/2011.  FINDINGS: Mediastinum and hilar structures stable. Stable cardiomegaly. Stable severe diffuse pulmonary chronic interstitial lung disease. Pleural parenchymal thickening noted consistent with scarring. Cardiac pacer  with lead tip over the right ventricle. No acute bony abnormality.  IMPRESSION: 1. Stable changes of severe chronic interstitial lung disease. 2. Stable cardiomegaly.  Cardiac pacer.   Electronically Signed   By: Marcello Moores  Register   On: 11/25/2013 16:48   EKG: Normal sinus rhythm Minimal voltage criteria for LVH, may be normal variant T wave inversion Septal leads   Janora Norlander, DO 11/25/2013, 7:04 PM PGY-1, [place] Intern pager: [phone], text pages welcome  I have examined and discussed the patient with the intern and FPTS/attending. Alterations to A/P are in blue to the interns note.  Howard Pouch DO PGY-3, [place] Intern pager: [phone], text pages welcome

## 2013-11-25 NOTE — Progress Notes (Signed)
Patient ID: Rachel [name], female   DOB: 10-27-1960, 53 y.o.   MRN: [id number]  PCP: Dr. 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 She is now on Revatio 80 mg tid and and inhaled Iloprost.  She has been evaluated for heart/lung transplant at [facility] as an inpatient but needed to raise around $10,000 to continue evaluation and has decided against this for now. She has been seeing Dr. Lamonte Sakai for her sarcoid.     Admitted in 4/15 with acute bronchitis and possibly some CHF.  She was given antibiotics, IV Lasix, and prednisone for possible component of sarcoid flare.  Echo 4/15 showed EF 60%, normal RV size and systolic function, unable to estimate PA pressure.    [facility] Follow up: Admitted 8/5-8/8 with dyspnea and was thought to be combination of CHF, sarcoidosis and underlying PNA. Treated with levofloxacin and prednisone and lasix was increased to BID for 1 week post discharge. When discharged she reports she didn't really feel better but she did not want to stay any longer. O2 sats at home are running in the 60-70s, SBP 120-150s. Not able to do much. Very fatigued. Still thinks she has infection going on. Weight at home 160, stable. Trying to follow low salt diet and drinking less than 2L a day. Reports stomach has been hurting her the past couple of days and she has been nauseous. Coughing up yellowish/brown sputum. Having fevers and chills.   6 minute (4/15): 123 m.      RHC (8/12): mean RA 2, PA 51/24, mean PA 35, mean PCWP 6, CI 3, PVR 5.1 WU.   Labs (11/10): BNP 91, creatinine 1.0, K 4  Labs (2/11): K 4.6, creatinine 0.86  Labs (3/11): BNP 58, K 4.2, creatinine 0.8  Labs (10/11): K 4.5, creatinine 0.8, BNP 76  Labs (7/13): LDL 102, K 4.1, creatinine 0.85 Labs (4/15): K 4.2, creatinine 0.76, BNP 467  Allergies (verified):  1) ! * Steroids High Dose  2) Demerol  3) Ultram  4) Darvocet-N 100  5)  Doxycycline   Past Medical History:  1. Sarcoidosis: Stage IV pulmonary sarcoid on home O2.  Has been quiescent recently.  She has been referred to [facility] for lung transplant evaluation.  2. Cardiomyopathy: Cardiac MRI in 2004 with no evidence for infiltrative disease but EF 40%. Echo (9/10): EF 60%, severely dilated RV with moderately decreased systolic function, moderate RAE, PASP 83 mmHg.  Echo (1/12): EF 76%, grade I diastolic dysfunction, moderate biatrial enlargement, moderate RV dilation with normal RV systolic function, peak RV-RA gradient 34 mmHg.  Echo (8/13): EF 55-60%, mildly dilated RV with mild systolic dysfunction, PA systolic pressure 45 mmHg. Echo (4/15) with EF 60%, normal RV size/systolic function, unable to estimate PA systolic pressure.  3. Pulmonary hypertension with RV failure: Moderate to severe, likely secondary to sarcoidosis and parenchymal lung disease/hypoxic pulmonary vasoconstriction. RHC 5/10 showed PA 67/30 (mean 46) with mean PCWP 6 mmHg. Patient started on Revatio in 5/10 without any change in oxygen saturation. 6 minute walk 7/10: 61 m. Echo (9/10): EF 60%, severely dilated RV with moderately decreased systolic function, moderate RAE, PASP 83 mmHg. RHC (9/10) with mean RA 15, PA 74/36, CI 2.8. Repeat RHC after diuresis with mean RA 3, PA 60/22, mean PCWP 4. Echo (1/11): EF 28-31%, grade I diastolic dysfunction, moderately dilated RV, mild to moderate RV dysfunction, moderate to severe TR, PASP 58  mmHg. 6 minute walk (2/11): 122 m. 6 min walk (6/11): 161.5 m. RHC (6/11): mean RA 11, RV 64/15, PA 63/28 mean 43, mean PCWP 14, CI 2.4 thermodilution and 3.2 Fick, PVR 5.3 WU Fick and 7.25 WU thermodilution. Iloprost begun. 6 min walk (10/11): 158 m. 6 min walk (1/12) 273 m.  6 min walk (7/12) 248 m.  RHC (8/12): mean RA 2, PA 51/24, mean PA 35, mean PCWP 6, CI 3, PVR 5.1 WU.  PASP 45 mmHg on 8/13 echo.  6 minute walk (4/14): 223 m. 6 minute walk (9/14) 299 m. 6 min walk (4/15) 123 m.   4. 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 Given end stage lung disease and normalization of LV function, the device was not replaced at ERI and tachy therapies were turned off.  5. GERD  6. Chronic chest pain. LHC (5/10) with no angiographic coronary disease.  7. Allergic rhinitis,  8. h/o iron deficiency anemia.  9. H/o secondary amenorrhea/irregular menses,  10. Psychosis with high dose steroids.  11. SVT  12. Recurrent Hypokalemia  13. h/o Rotator cuff sprain  14. 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   ROS: All systems reviewed and negative except as per HPI.    Current Outpatient Prescriptions  Medication Sig Dispense Refill  . albuterol (PROAIR HFA) 108 (90 BASE) MCG/ACT inhaler Inhale 2 puffs into the lungs 4 (four) times daily.  18 g  11  . ARIPiprazole (ABILIFY) 2 MG tablet Take 1 tablet (2 mg total) by mouth every other day.  30 tablet  1  . aspirin EC 81 MG tablet Take 1 tablet (81 mg total) by mouth daily.  1 tablet  0  . budesonide-formoterol (SYMBICORT) 160-4.5 MCG/ACT inhaler Inhale 1 puff into the lungs 2 (two) times daily.  1 Inhaler  3  . esomeprazole (NEXIUM) 40 MG capsule Take 1 capsule (40 mg total) by mouth every morning.  30 capsule  3  . furosemide (LASIX) 40 MG tablet Take 1 tablet (40 mg total) by mouth daily.  30 tablet  1  . metoprolol tartrate (LOPRESSOR) 25 MG tablet Take 1 tablet (25 mg total) by mouth 2 (two) times daily.  60 tablet  0  . PARoxetine (PAXIL) 20 MG tablet Take 1 tablet (20 mg total) by mouth daily.  30 tablet  2  .  potassium chloride SA (K-DUR,KLOR-CON) 20 MEQ tablet Take 1-2 tablets (20-40 mEq total) by mouth 2 (two) times daily. 40 meq in the morning and 20 meq at night  90 tablet  3  . sildenafil (REVATIO) 20 MG tablet Take 4 tablets (80 mg total) by mouth 3 (three) times daily. take 4  tablets every 8 hours  10 tablet  0  . Treprostinil (TYVASO) 0.6 MG/ML SOLN Inhale 18 mcg into the lungs 4 (four) times daily.  3.6 mL  11   No current facility-administered medications for this encounter.    Wt 163 lb 8 oz (74.163 kg)  SpO2 65% General: Well-developed,well-nourished,in no acute distress; O2 applied via nasal cannula  Neck: Neck  supple, JVP 8 cm. No masses, thyromegaly or abnormal cervical nodes.  Lungs: Crackles at bases bilaterally.   Heart: Non-displaced PMI, chest non-tender; regular rate and rhythm, S1, S2 without rubs or gallops. 1/6 systolic murmur LLSB. Loud P2. Carotid upstroke normal, no bruit. Pedals normal pulses. Trace ankle edema bilaterally. Abdomen: Bowel sounds positive; abdomen soft and tender without masses, organomegaly, or hernias noted. No hepatosplenomegaly.  Extremities: No clubbing or cyanosis.  Neurologic: Alert and oriented x 3.  Psych: Normal affect.  Assessment/Plan:  PULMONARY HYPERTENSION  She has sarcoidosis but has pulmonary hypertension out of proportion to sarcoidosis (suspect WHO group 1 component).  She is currently on Tyvaso and Revatio.  Last right heart cath in 8/12 showed improved PA pressure and mildly improved cardiac index on Iloprost. PA pressure and PVR was still high, however. Last echo in 4/15 showed normal LV EF and normal RV size and systolic function.  Unable to estimate PA systolic pressure. Volume status mildly elevated - Current NYHA IV symptoms and oxygen saturations in the 60s on arrival to clinic. She was recently admitted with suspected PNA and?sarcoidosis flare and was treated and discharged. Given current symptoms and low oxygen saturation, she  will need to be evaluated for admission through the ER.   Rande Brunt MD 11/25/2013 2:46 PM  Patient seen with NP, agree with the above note.  Patient has end-stage lung disease from pulmonary sarcoidosis and has been on 4 L oxygen at home.  She is on Tyvaso and Revatio for severe pulmonary arterial hypertension as well.  Today, oxygen saturation is in the 60-70% range on 4L oxygen.  She says it has been like this at home since she left the hospital after recent admission.  She is short of breath at rest.  She does not appear volume overloaded and weight is stable.  I am going to send her to the ER for evaluation, she will need admission.  She needs pulmonary evaluation and will need oxygen by facemask versus Bipap.  She has had subjective fevers.  ? PNA or acute bronchitis superimposed on baseline severe lung disease from advanced pulmonary sarcoidosis.  It would be reasonable to start her on Lasix 40 mg IV bid while hospitalized though I think this is more of a pulmonary issue with lesser role for CHF/pulmonary edema.   [name] temporal arteritis 5:26 PM

## 2013-11-25 NOTE — ED Notes (Signed)
Dr.Jacubowitz at bedside

## 2013-11-25 NOTE — ED Provider Notes (Signed)
CSN: [id number]     Arrival date & time 11/25/13  1544 History   First MD Initiated Contact with Patient 11/25/13 1603     Chief Complaint  Patient presents with  . Shortness of Breath     (Consider location/radiation/quality/duration/timing/severity/associated sxs/prior Treatment) HPI Complains of continued shortness of breath and cough productive of rust colored sputum for approximately 2 weeks. Seen at heart failure clinic today. Per discussion with Ms. Boyce Medici, NP Dr. Aundra Dubin, cardiologist evaluate patient today felt shortness of breath and cough fell secondary to pneumonia rather than heart failure. He was requesting pulmonary critical care the involved with patient's case she's been to with Levaquin, prednisone and albuterol, without relief. She has increased oxygen requirements. She denies chest pain. No other associated symptoms. Past Medical History  Diagnosis Date  . GERD (gastroesophageal reflux disease)   . Hypertension   . 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 CHF (congestive heart failure)     Right side- Dr. Aundra Dubin ([facility])  . Pulmonary HTN   . Anxiety   . Cardiomyopathy   . NSVT (nonsustained ventricular tachycardia)     with syncope: St Jude ICD was implanted. Device now nearing ERI. Given end stage lung disease and normalization of LV function, the device will not be replaced and tachy therapies were turned off  . Chronic chest pain     LHC (08/2008) with no angiographic coronary diease  . Allergic rhinitis   . H/O: iron deficiency anemia   . Secondary amenorrhea     irregular menses  . Psychosis     with high dose steroids  . SVT (supraventricular tachycardia)     possible runs  . Hypokalemia     recurrent  . Rotator cuff sprain   . Chronic back pain    Past Surgical History  Procedure Laterality Date  . Cardiac catheterization  08/26/2008    5% EF with normal coronary arteries and normal wall motion  . Adenosine cardiolyte  03/31/2003    no  ischemia/infarct; marked global LV hypekinesis; resting EF 22%  . Cardiac catheterization  03/31/2003    globally depressed LV fxn with EF 20%; idiopathic dilated cardiomyopathy  . Defibrillator placed  03/31/2003    St. Jude single chamber (Dr. Cristopher Peru)  . Pfts      FVC 2000 (56%) FEV1 1400 (47%), ratio 0.7; insignif response to bronchodilatior; stable from 1/05 - 9/05; PFTs: Mod restriction, FVC 1.9L, FEV1 1.6L, both 53% predicted; slt response to brochodilators 04/08/2002   Family History  Problem Relation Age of Onset  . Lung cancer Father   . Cancer Father     lung cancer  . Hypertension    . Diabetes Mother     border line  . Hypertension Mother   . Heart failure Mother    History  Substance Use Topics  . Smoking status: Former Smoker -- 1.50 packs/day for 11 years    Types: Cigarettes    Quit date: 04/08/1992  . Smokeless tobacco: Never Used     Comment: 1ppd x 20 years  . Alcohol Use: Yes     Comment: remote history of alcohol abuse (quit 1994)   OB History   Grav Para Term Preterm Abortions TAB SAB Ect Mult Living                 Review of Systems  Constitutional: Negative.   HENT: Negative.   Respiratory: Positive for cough and shortness of breath.  Cardiovascular: Negative.   Gastrointestinal: Negative.   Musculoskeletal: Negative.   Skin: Negative.   Neurological: Negative.   Psychiatric/Behavioral: Negative.   All other systems reviewed and are negative.     Allergies  Latex; Nitroglycerin; Other; Meperidine hcl; Tramadol hcl; Ace inhibitors; Morphine; and Propoxyphene n-acetaminophen  Home Medications   Prior to Admission medications   Medication Sig Start Date End Date Taking? Authorizing Provider  albuterol (PROAIR HFA) 108 (90 BASE) MCG/ACT inhaler Inhale 2 puffs into the lungs 4 (four) times daily. 10/01/13   Willeen Niece, MD  ARIPiprazole (ABILIFY) 2 MG tablet Take 1 tablet (2 mg total) by mouth every other day. 11/01/13   [place], MD  aspirin EC 81 MG tablet Take 1 tablet (81 mg total) by mouth daily. 08/21/13   Waylan Boga, NP  budesonide-formoterol (SYMBICORT) 160-4.5 MCG/ACT inhaler Inhale 1 puff into the lungs 2 (two) times daily. 11/03/13   Sharon [place] [address], MD  esomeprazole (NEXIUM) 40 MG capsule Take 1 capsule (40 mg total) by mouth every morning. 11/13/13   [place], MD  furosemide (LASIX) 40 MG tablet Take 1 tablet (40 mg total) by mouth daily. 11/13/13   [place], MD  metoprolol tartrate (LOPRESSOR) 25 MG tablet Take 1 tablet (25 mg total) by mouth 2 (two) times daily. 11/13/13   [place], MD  PARoxetine (PAXIL) 20 MG tablet Take 1 tablet (20 mg total) by mouth daily. 11/03/13   Sharon [address], MD  potassium chloride SA (K-DUR,KLOR-CON) 20 MEQ tablet Take 1-2 tablets (20-40 mEq total) by mouth 2 (two) times daily. 40 meq in the morning and 20 meq at night 11/03/13   [place], MD  sildenafil (REVATIO) 20 MG tablet Take 4 tablets (80 mg total) by mouth 3 (three) times daily. take 4  tablets every 8 hours 08/21/13   Waylan Boga, NP  Treprostinil (TYVASO) 0.6 MG/ML SOLN Inhale 18 mcg into the lungs 4 (four) times daily. 10/18/13   Larey Dresser, MD   oxygen 4-6 L BP 125/76  Pulse 92  Temp(Src) 98 F (36.7 C)  Resp 20  Wt 160 lb 8 oz (72.802 kg)  SpO2 92% Physical Exam  Nursing note and vitals reviewed. Constitutional: She is oriented to person, place, and time. She appears well-developed and well-nourished.  Mild respiratory distress. Speaks in sentences to  HENT:  Head: Normocephalic and atraumatic.  Eyes: Conjunctivae are normal. Pupils are equal, round, and reactive to light.  Neck: Neck supple. JVD present. No tracheal deviation present. No thyromegaly present.  Cardiovascular: Normal rate and regular rhythm.   No murmur heard. Pulmonary/Chest: Effort normal and breath sounds normal.  No retractions. Rales right side posteriorly.  Abdominal:  Soft. Bowel sounds are normal. She exhibits no distension. There is no tenderness.  Musculoskeletal: Normal range of motion. She exhibits no edema and no tenderness.  Neurological: She is alert and oriented to person, place, and time. Coordination normal.  Skin: Skin is warm and dry. No rash noted.  Psychiatric: She has a normal mood and affect.    ED Course  Procedures (including critical care time) Labs Review Labs Reviewed - No data to display  Imaging Review No results found.   EKG Interpretation None     4:10 PM improved after treatment with albuterol nebulized treatment   Date: 11/25/2013  Rate: 80  Rhythm: normal sinus rhythm  QRS Axis: right  Intervals: normal  ST/T Wave abnormalities: T-wave inversions in  the septal leads new since previous tracing  Conduction Disutrbances:none  Narrative Interpretation:   Old EKG Reviewed: Rate slower and new T-wave inversions, septal leads compared to tracing from 11/10/2013 interpreted by me Nursing was unable to establish IV access. Angiocath insertion Performed by: Orlie Dakin  Consent: Verbal con sent obtained. Risks and benefits: risks, benefits and alternatives were discussed Time out: Immediately prior to procedure a "time out" was called to verify the correct patient, procedure, equipment, support staff and site/side marked as required.  Preparation: Patient was prepped and draped in the usual sterile fashion.  Vein Location: left external jugular    Gauge: 20  Normal blood return and flush without difficulty Patient tolerance: Patient tolerated the procedure well with no immediate complications.  Chest xray viewed by me Results for orders placed during the hospital encounter of 11/25/13  CBC WITH DIFFERENTIAL      Result Value Ref Range   WBC 10.3  4.0 - 10.5 K/uL   RBC 3.55 (*) 3.87 - 5.11 MIL/uL   Hemoglobin 10.2 (*) 12.0 - 15.0 g/dL   HCT 32.2 (*) 36.0 - 46.0 %   MCV 90.7  78.0 - 100.0 fL   MCH 28.7   26.0 - 34.0 pg   MCHC 31.7  30.0 - 36.0 g/dL   RDW 14.5  11.5 - 15.5 %   Platelets 258  150 - 400 K/uL   Neutrophils Relative % 68  43 - 77 %   Neutro Abs 7.0  1.7 - 7.7 K/uL   Lymphocytes Relative 21  12 - 46 %   Lymphs Abs 2.2  0.7 - 4.0 K/uL   Monocytes Relative 5  3 - 12 %   Monocytes Absolute 0.6  0.1 - 1.0 K/uL   Eosinophils Relative 5  0 - 5 %   Eosinophils Absolute 0.5  0.0 - 0.7 K/uL   Basophils Relative 1  0 - 1 %   Basophils Absolute 0.1  0.0 - 0.1 K/uL  D-DIMER, QUANTITATIVE      Result Value Ref Range   D-Dimer, Quant 0.51 (*) 0.00 - 0.48 ug/mL-FEU  I-STAT CHEM 8, ED      Result Value Ref Range   Sodium 140  137 - 147 mEq/L   Potassium 4.3  3.7 - 5.3 mEq/L   Chloride 105  96 - 112 mEq/L   BUN 19  6 - 23 mg/dL   Creatinine, Ser 0.70  0.50 - 1.10 mg/dL   Glucose, Bld 88  70 - 99 mg/dL   Calcium, Ion 1.19  1.12 - 1.23 mmol/L   TCO2 32  0 - 100 mmol/L   Hemoglobin 11.2 (*) 12.0 - 15.0 g/dL   HCT 33.0 (*) 36.0 - 46.0 %  I-STAT CG4 LACTIC ACID, ED      Result Value Ref Range   Lactic Acid, Venous 0.56  0.5 - 2.2 mmol/L  I-STAT TROPOININ, ED      Result Value Ref Range   Troponin i, poc 0.00  0.00 - 0.08 ng/mL   Comment 3           I-STAT ARTERIAL BLOOD GAS, ED      Result Value Ref Range   pH, Arterial 7.408  7.350 - 7.450   pCO2 arterial 50.0 (*) 35.0 - 45.0 mmHg   pO2, Arterial 116.0 (*) 80.0 - 100.0 mmHg   Bicarbonate 31.5 (*) 20.0 - 24.0 mEq/L   TCO2 33  0 - 100 mmol/L   O2 Saturation 98.0  Acid-Base Excess 6.0 (*) 0.0 - 2.0 mmol/L   Patient temperature 99.2 F     Collection site RADIAL, ALLEN'S TEST ACCEPTABLE     Drawn by Operator     Sample type ARTERIAL     Dg Chest Portable 1 View  11/25/2013   CLINICAL DATA:  Shortness of breath.  EXAM: PORTABLE CHEST - 1 VIEW  COMPARISON:  Chest x-ray 11/10/2013.  , 08/21/2013, 09/23/2011.  FINDINGS: Mediastinum and hilar structures stable. Stable cardiomegaly. Stable severe diffuse pulmonary chronic  interstitial lung disease. Pleural parenchymal thickening noted consistent with scarring. Cardiac pacer with lead tip over the right ventricle. No acute bony abnormality.  IMPRESSION: 1. Stable changes of severe chronic interstitial lung disease. 2. Stable cardiomegaly.  Cardiac pacer.   Electronically Signed   By: [place]   On: 11/25/2013 16:48   Dg Chest Port 1 View  11/10/2013   CLINICAL DATA:  Shortness of breath. Sarcoidosis. Pulmonary hypertension.  EXAM: PORTABLE CHEST - 1 VIEW  COMPARISON:  10/24/2013 and 09/20/2013 and 09/23/2011  FINDINGS: AICD in place. The patient has severe chronic lung disease consistent with sarcoidosis. There is chronic enlargement of the main pulmonary arteries consistent with the history of pulmonary arterial hypertension. There is haziness in both lung bases which is essentially unchanged since the prior exam.  IMPRESSION: Severe chronic lung disease. No significant change since the prior study.   Electronically Signed   By: Rozetta Nunnery M.D.   On: 11/10/2013 19:18   840 pm pt resting comfortably on oxygen 55% via face mask MDM  pts ddimer minimally elevated . Consider r/ pe in ddx. consider v/q scan or doppler studies of legs. sspoke with family practice resident. MD suggest pulmonary consultation , pt to be admittedto step down unit  Final diagnoses:  None  Dx #1 hypoxia #2 anemia #3 abnormal chest xray #4 abnormal EKG CRITICAL CARE Performed by: Orlie Dakin Total critical care time: 30 minute Critical care time was exclusive of separately billable procedures and treating other patients. Critical care was necessary to treat or prevent imminent or life-threatening deterioration. Critical care was time spent personally by me on the following activities: development of treatment plan with patient and/or surrogate as well as nursing, discussions with consultants, evaluation of patient's response to treatment, examination of patient, obtaining history  from patient or surrogate, ordering and performing treatments and interventions, ordering and review of laboratory studies, ordering and review of radiographic studies, pulse oximetry and re-evaluation of patient's condition.    Orlie Dakin, MD 11/25/13 2049

## 2013-11-25 NOTE — ED Notes (Signed)
Family Medicine at bedside

## 2013-11-25 NOTE — ED Notes (Signed)
IV team at bedside 

## 2013-11-26 DIAGNOSIS — I509 Heart failure, unspecified: Secondary | ICD-10-CM

## 2013-11-26 DIAGNOSIS — I2789 Other specified pulmonary heart diseases: Secondary | ICD-10-CM

## 2013-11-26 DIAGNOSIS — D869 Sarcoidosis, unspecified: Principal | ICD-10-CM

## 2013-11-26 DIAGNOSIS — R0989 Other specified symptoms and signs involving the circulatory and respiratory systems: Secondary | ICD-10-CM

## 2013-11-26 DIAGNOSIS — I10 Essential (primary) hypertension: Secondary | ICD-10-CM

## 2013-11-26 DIAGNOSIS — R0609 Other forms of dyspnea: Secondary | ICD-10-CM

## 2013-11-26 DIAGNOSIS — R0902 Hypoxemia: Secondary | ICD-10-CM

## 2013-11-26 LAB — PROCALCITONIN

## 2013-11-26 LAB — CLOSTRIDIUM DIFFICILE BY PCR: Toxigenic C. Difficile by PCR: NEGATIVE

## 2013-11-26 LAB — COMPREHENSIVE METABOLIC PANEL
ALBUMIN: 3.4 g/dL — AB (ref 3.5–5.2)
ALK PHOS: 56 U/L (ref 39–117)
ALT: 9 U/L (ref 0–35)
AST: 12 U/L (ref 0–37)
Anion gap: 11 (ref 5–15)
BUN: 17 mg/dL (ref 6–23)
CO2: 26 mEq/L (ref 19–32)
Calcium: 9.5 mg/dL (ref 8.4–10.5)
Chloride: 104 mEq/L (ref 96–112)
Creatinine, Ser: 0.6 mg/dL (ref 0.50–1.10)
GFR calc non Af Amer: >90 mL/min (ref >90)
GLUCOSE: 138 mg/dL — AB (ref 70–99)
Potassium: 4.9 mEq/L (ref 3.7–5.3)
SODIUM: 141 meq/L (ref 137–147)
Total Bilirubin: 0.5 mg/dL (ref 0.3–1.2)
Total Protein: 7.8 g/dL (ref 6.0–8.3)

## 2013-11-26 LAB — CBC
HEMATOCRIT: 33.6 % — AB (ref 36.0–46.0)
HEMOGLOBIN: 10.1 g/dL — AB (ref 12.0–15.0)
MCH: 28.3 pg (ref 26.0–34.0)
MCHC: 30.1 g/dL (ref 30.0–36.0)
MCV: 94.1 fL (ref 78.0–100.0)
Platelets: 211 10*3/uL (ref 150–400)
RBC: 3.57 MIL/uL — ABNORMAL LOW (ref 3.87–5.11)
RDW: 14.5 % (ref 11.5–15.5)
WBC: 8.8 10*3/uL (ref 4.0–10.5)

## 2013-11-26 LAB — SEDIMENTATION RATE: Sed Rate: 65 mm/hr — ABNORMAL HIGH (ref 0–22)

## 2013-11-26 LAB — PRO B NATRIURETIC PEPTIDE: Pro B Natriuretic peptide (BNP): 649.1 pg/mL — ABNORMAL HIGH (ref 0–125)

## 2013-11-26 MED ORDER — ALBUTEROL SULFATE (2.5 MG/3ML) 0.083% IN NEBU
2.5000 mg | INHALATION_SOLUTION | Freq: Three times a day (TID) | RESPIRATORY_TRACT | Status: DC
  Administered 2013-11-26 – 2013-11-29 (×11): 2.5 mg via RESPIRATORY_TRACT
  Filled 2013-11-26 (×11): qty 3

## 2013-11-26 MED ORDER — ENOXAPARIN SODIUM 80 MG/0.8ML ~~LOC~~ SOLN
1.0000 mg/kg | Freq: Two times a day (BID) | SUBCUTANEOUS | Status: DC
Start: 2013-11-26 — End: 2013-11-28
  Administered 2013-11-26 – 2013-11-28 (×5): 75 mg via SUBCUTANEOUS
  Filled 2013-11-26 (×8): qty 0.8

## 2013-11-26 MED ORDER — VANCOMYCIN HCL IN DEXTROSE 1-5 GM/200ML-% IV SOLN
1000.0000 mg | Freq: Two times a day (BID) | INTRAVENOUS | Status: DC
  Administered 2013-11-26 – 2013-11-27 (×4): 1000 mg via INTRAVENOUS
  Filled 2013-11-26 (×5): qty 200

## 2013-11-26 MED ORDER — DEXTROSE 5 % IV SOLN
1.0000 g | Freq: Three times a day (TID) | INTRAVENOUS | Status: DC
  Administered 2013-11-26 – 2013-11-28 (×6): 1 g via INTRAVENOUS
  Filled 2013-11-26 (×7): qty 1

## 2013-11-26 MED ORDER — FUROSEMIDE 10 MG/ML IJ SOLN
40.0000 mg | Freq: Two times a day (BID) | INTRAMUSCULAR | Status: DC
  Administered 2013-11-26 – 2013-11-27 (×3): 40 mg via INTRAVENOUS
  Filled 2013-11-26 (×5): qty 4

## 2013-11-26 MED ORDER — ALBUTEROL SULFATE (2.5 MG/3ML) 0.083% IN NEBU
2.5000 mg | INHALATION_SOLUTION | RESPIRATORY_TRACT | Status: DC | PRN
  Filled 2013-11-26: qty 3

## 2013-11-26 NOTE — Progress Notes (Signed)
ANTICOAGULATION CONSULT NOTE - Initial Consult  Pharmacy Consult for Lovenox  Indication: pulmonary embolus, rule out (VQ ordered)  Patient Measurements: Height: _0  (160 cm) Weight: 165 lb 3.2 oz (74.934 kg) IBW/kg (Calculated) : 52.4  Vital Signs: Temp: 98.5 F (36.9 C) (08/21 0352) Temp src: Oral (08/21 0352) BP: 110/75 mmHg (08/21 0352) Pulse Rate: 99 (08/21 0352)  Labs:  Recent Labs  11/25/13 1744 11/25/13 1800 11/26/13 0330  HGB 11.2* 10.2* 10.1*  HCT 33.0* 32.2* 33.6*  PLT  --  258 211  CREATININE 0.70  --  0.60    Medical History: Past Medical History  Diagnosis Date  . GERD (gastroesophageal reflux disease)   . Hypertension   . 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 CHF (congestive heart failure)     Right side- Dr. Aundra Dubin ([facility])  . Pulmonary HTN   . Anxiety   . Cardiomyopathy   . NSVT (nonsustained ventricular tachycardia)     with syncope: St Jude ICD was implanted. Device now nearing ERI. Given end stage lung disease and normalization of LV function, the device will not be replaced and tachy therapies were turned off  . Chronic chest pain     LHC (08/2008) with no angiographic coronary diease  . Allergic rhinitis   . H/O: iron deficiency anemia   . Secondary amenorrhea     irregular menses  . Psychosis     with high dose steroids  . SVT (supraventricular tachycardia)     possible runs  . Hypokalemia     recurrent  . Rotator cuff sprain   . Chronic back pain     Assessment: 53 y/o F with multiple medical problems to start Lovenox until VQ scan can be completed to r/o PE. Hgb 10.1, renal function ok, other labs as above.   Goal of Therapy:  Monitor platelets by anticoagulation protocol: Yes   Plan:  -Lovenox 39m/kg subcutaneous q12h -Minimum q72h CBC while on Lovenox -Monitor for bleeding  LNarda Bonds8/21/2015,6:44 AM

## 2013-11-26 NOTE — Progress Notes (Signed)
ANTIBIOTIC CONSULT NOTE - INITIAL  Pharmacy Consult for Vancomycin and Ceftazidime Indication: rule out pneumonia  Allergies  Allergen Reactions  . Latex Rash  . Nitroglycerin Other (See Comments)    Patient is on Revatio  . Other Other (See Comments)    High Dose Steroids cause chemical imbalance and altered mental status  . Meperidine Hcl Other (See Comments)    REACTION: Makes her feel funny  . Tramadol Hcl Other (See Comments)    REACTION: nausea, lightheadedness, sleepiness, and dizziness  . Ace Inhibitors Other (See Comments)    unknown  . Morphine Other (See Comments)    Other   . Propoxyphene N-Acetaminophen Rash    Patient Measurements: Height: 5' 3" (160 cm) Weight: 165 lb 3.2 oz (74.934 kg) IBW/kg (Calculated) : 52.4 Adjusted Body Weight: 61.4 kg  Vital Signs: Temp: 97.2 F (36.2 C) (08/21 0838) Temp src: Axillary (08/21 0838) BP: 126/55 mmHg (08/21 0902) Pulse Rate: 79 (08/21 0902) Intake/Output from previous day: 08/20 0701 - 08/21 0700 In: 3 [I.V.:3] Out: 300 [Urine:300] Intake/Output from this shift: Total I/O In: 240 [P.O.:240] Out: -   Labs:  Recent Labs  11/25/13 1744 11/25/13 1800 11/26/13 0330  WBC  --  10.3 8.8  HGB 11.2* 10.2* 10.1*  PLT  --  258 211  CREATININE 0.70  --  0.60   Estimated Creatinine Clearance: 78.8 ml/min (by C-G formula based on Cr of 0.6). No results found for this basename: VANCOTROUGH, Corlis Leak, VANCORANDOM, [place], GENTPEAK, GENTRANDOM, TOBRATROUGH, TOBRAPEAK, TOBRARND, AMIKACINPEAK, AMIKACINTROU, AMIKACIN,  in the last 72 hours   Microbiology: Recent Results (from the past 720 hour(s))  CULTURE, BLOOD (ROUTINE X 2)     Status: None   Collection Time    11/10/13 11:00 PM      Result Value Ref Range Status   Specimen Description BLOOD RIGHT HAND   Final   Special Requests BOTTLES DRAWN AEROBIC ONLY 4CC   Final   Culture  Setup Time     Final   Value: 11/11/2013 10:50     Performed at [facility]   Culture     Final   Value: NO GROWTH 5 DAYS     Performed at [facility]   Report Status 11/17/2013 FINAL   Final  CULTURE, BLOOD (ROUTINE X 2)     Status: None   Collection Time    11/10/13 11:10 PM      Result Value Ref Range Status   Specimen Description BLOOD LEFT HAND   Final   Special Requests BOTTLES DRAWN AEROBIC ONLY 5CC   Final   Culture  Setup Time     Final   Value: 11/11/2013 10:50     Performed at [facility]   Culture     Final   Value: NO GROWTH 5 DAYS     Performed at [facility]   Report Status 11/17/2013 FINAL   Final  MRSA PCR SCREENING     Status: None   Collection Time    11/11/13 12:50 AM      Result Value Ref Range Status   MRSA by PCR NEGATIVE  NEGATIVE Final   Comment:            The GeneXpert MRSA Assay (FDA     approved for NASAL specimens     only), is one component of a     comprehensive MRSA colonization     surveillance program. It is not  intended to diagnose MRSA     infection nor to guide or     monitor treatment for     MRSA infections.  CULTURE, EXPECTORATED SPUTUM-ASSESSMENT     Status: None   Collection Time    11/12/13  3:31 PM      Result Value Ref Range Status   Specimen Description SPUTUM   Final   Special Requests NONE   Final   Sputum evaluation     Final   Value: MICROSCOPIC FINDINGS SUGGEST THAT THIS SPECIMEN IS NOT REPRESENTATIVE OF LOWER RESPIRATORY SECRETIONS. PLEASE RECOLLECT.     CALLED TO M.VAUGHN AT 1610 BY L.PITT 11/12/13 CALLED TO RN   Report Status 11/12/2013 FINAL   Final  CULTURE, BLOOD (ROUTINE X 2)     Status: None   Collection Time    11/25/13  5:45 PM      Result Value Ref Range Status   Specimen Description BLOOD ARM RIGHT   Final   Special Requests BOTTLES DRAWN AEROBIC AND ANAEROBIC 10CC   Final   Culture  Setup Time     Final   Value: 11/25/2013 22:39     Performed at [facility]   Culture     Final   Value:        BLOOD CULTURE RECEIVED NO GROWTH  TO DATE CULTURE WILL BE HELD FOR 5 DAYS BEFORE ISSUING A FINAL NEGATIVE REPORT     Performed at [facility]   Report Status PENDING   Incomplete  CULTURE, BLOOD (ROUTINE X 2)     Status: None   Collection Time    11/25/13  6:00 PM      Result Value Ref Range Status   Specimen Description BLOOD ARM LEFT   Final   Special Requests BOTTLES DRAWN AEROBIC AND ANAEROBIC 10CC   Final   Culture  Setup Time     Final   Value: 11/25/2013 22:41     Performed at [facility]   Culture     Final   Value:        BLOOD CULTURE RECEIVED NO GROWTH TO DATE CULTURE WILL BE HELD FOR 5 DAYS BEFORE ISSUING A FINAL NEGATIVE REPORT     Performed at [facility]   Report Status PENDING   Incomplete  CLOSTRIDIUM DIFFICILE BY PCR     Status: None   Collection Time    11/26/13  7:19 AM      Result Value Ref Range Status   C difficile by pcr NEGATIVE  NEGATIVE Final    Medical History: Past Medical History  Diagnosis Date  . GERD (gastroesophageal reflux disease)   . Hypertension   . 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 CHF (congestive heart failure)     Right side- Dr. Aundra Dubin ([facility])  . Pulmonary HTN   . Anxiety   . Cardiomyopathy   . NSVT (nonsustained ventricular tachycardia)     with syncope: St Jude ICD was implanted. Device now nearing ERI. Given end stage lung disease and normalization of LV function, the device will not be replaced and tachy therapies were turned off  . Chronic chest pain     LHC (08/2008) with no angiographic coronary diease  . Allergic rhinitis   . H/O: iron deficiency anemia   . Secondary amenorrhea     irregular menses  . Psychosis     with high dose steroids  . SVT (supraventricular tachycardia)  possible runs  . Hypokalemia     recurrent  . Rotator cuff sprain   . Chronic back pain     Medications:  Prescriptions prior to admission  Medication Sig Dispense Refill  . albuterol (PROAIR HFA) 108 (90 BASE) MCG/ACT inhaler Inhale 2  puffs into the lungs 4 (four) times daily.  18 g  11  . ARIPiprazole (ABILIFY) 2 MG tablet Take 1 tablet (2 mg total) by mouth every other day.  30 tablet  1  . aspirin EC 81 MG tablet Take 1 tablet (81 mg total) by mouth daily.  1 tablet  0  . budesonide-formoterol (SYMBICORT) 160-4.5 MCG/ACT inhaler Inhale 1 puff into the lungs 2 (two) times daily.  1 Inhaler  3  . esomeprazole (NEXIUM) 40 MG capsule Take 40 mg by mouth every morning.      . furosemide (LASIX) 40 MG tablet Take 40 mg by mouth daily.      . metoprolol tartrate (LOPRESSOR) 25 MG tablet Take 25 mg by mouth 2 (two) times daily.      Marland Kitchen PARoxetine (PAXIL) 20 MG tablet Take 1 tablet (20 mg total) by mouth daily.  30 tablet  2  . potassium chloride SA (K-DUR,KLOR-CON) 20 MEQ tablet Take 1-2 tablets (20-40 mEq total) by mouth 2 (two) times daily. 40 meq in the morning and 20 meq at night  90 tablet  3  . sildenafil (REVATIO) 20 MG tablet Take 4 tablets (80 mg total) by mouth 3 (three) times daily. take 4  tablets every 8 hours  10 tablet  0  . Treprostinil (TYVASO) 0.6 MG/ML SOLN Inhale 18 mcg into the lungs 4 (four) times daily.  3.6 mL  11   Assessment: Pt is a 53yo female starting vancomycin and ceftazidime empirically for possible HCAP. She desat into 70s during ambulation on 4L [place] in HF clinic 8/20 following hospital discharge 2 weeks ago. She is afebrile with WBC wnl, stable sCr and severe chronic interstitial lung dz on CXR.   Goal of Therapy:  Vancomycin trough level 15-20 mcg/ml  Plan:  -Start ceftazidime 1g q8h -Start vancomycin 1g q12h -VT @ SS -F/u on renal function and Cxs  Andrey Cota. Diona Foley, PharmD Clinical Pharmacist Pager [phone] 11/26/2013,10:52 AM

## 2013-11-26 NOTE — Progress Notes (Signed)
Family Medicine Teaching Service Daily Progress Note Intern Pager: [phone]  Patient name: Rachel [name] Medical record number: [id number] Date of birth: 05/16/1960 Age: 53 y.o. Gender: female  Primary Care Provider: Christa See, MD Consultants: Pulmonology Code Status: FULL  Pt Overview and Major Events to Date:  08/20: Admitted to telemetry floor  Assessment and Plan: Rachel [name] is a 53 y.o. female presenting with hypoxia. PMH is significant for pulmonary sarcoidosis, pulmonary hypertension, cardiomyopathy, CHF, SVT, major depressive disorder with psychotic features, HTN   #Hypoxia: 64% at CHF clinic today, pt seen by Dr. Aundra Dubin and sent to ED for evaluation. Patients current oxygen saturation 100% on ventimask, 55%. DDx Bronchitis vs Pneumonia (empirically treated last admission with Levaquin) vs worsening underlying lung disease vs CHF (no evidence of fluid overload) vs PE. Of note, patient was hospitalized on 11/10/13 for acute dyspnea. 08/20: CXR negative for acute abnormalities. Hypoxia does not appear infectious with WBC 10.3 and no infiltrates on cxr or from fluid overload by exam and cxr.  -Continuous pulse ox  -Currently on ventimask 6L @ 55%. Respiratory to wean if able. If worsens will need to to obtain ABG and possibly start Bipap.  -Continue home symbicort, revatio, tyvaso  -Prednisone 10 mg  -Pulmonology (Dr Nelda Marseille) recommends treatment with empric abx: Vanc and ceftazadime -elevated D-Dimer (0.51). Unable to obtain CTA d/t IV placement in left External Jugular. VQ scan ordered but patient was not amendable because it requires her to be off of O2 x81mns and to lay flat on her back.  Lovenox on board. No S1Q3T3 on ekg.  -Home CPAP QHS if able to maintain O2 sats, if unable may need bipap   #CHF: Echo: EF 628%Grade 1 diastolic dysfxn w/ mild dysolic dysfxn, PA systolic pressure [id number] Does not appear fluid overloaded on exam.  -Patient on lasix 498mQD at home. Will  increase to Lasix 40 BID during stay  -daily BMETs  -Continue home dose K  -SL IV. Patient is PO- no need for extra fluids given h/o CHF.   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  #HTN: BP 126/55 HR 79. Stable  -Continue home Metoprolol 25 BID    FEN/GI: SL IV, Protonix, Heart healthy diet  Prophylaxis: Sub-q heparin   Disposition: Discharge home pending pulmonary evaluation/ recs and stabilization of O2.  Subjective:  Patient reports that she is doing the same as yesterday. Admits to chills, headache, SOB, cough and intermittent dizziness when off venturimask for a long period.    Objective: Temp:  [97.1 F (36.2 C)-99.2 F (37.3 C)] 98.5 F (36.9 C) (08/21 0352) Pulse Rate:  [63-99] 99 (08/21 0352) Resp:  [13-33] 20 (08/21 0352) BP: (103-142)/(49-85) 110/75 mmHg (08/21 0352) SpO2:  [81 %-100 %] 91 % (08/21 0352) FiO2 (%):  [55 %] 55 % (08/20 2350) Weight:  [160 lb 8 oz (72.802 kg)-165 lb 3.2 oz (74.934 kg)] 165 lb 3.2 oz (74.934 kg) (08/21 0352) Physical Exam: General: AA female, sitting up in bed, NAD, [place] in place, venti mask on lap, eating breakfast  Cardiovascular: S1S2, RRR, no murmurs Respiratory: Diffuse crackles b/l lung spaces (unchanged from yesterday), no appreciable wheeze, speaks in full sentences, O2 saturations b/t 81-86% on 4L [place] and 91-99% on ventimask _0 %  Abdomen: obese, soft, ND, mild TTP in lower R and L quadrants, no guarding Extremities: WWP, no edema Neuro: moves extremities spontaneously, aaox3  Laboratory:  Recent Labs Lab 11/25/13 1744 11/25/13 1800 11/26/13 0330  WBC  --  10.3 8.8  HGB 11.2* 10.2* 10.1*  HCT 33.0* 32.2* 33.6*  PLT  --  258 211    Recent Labs Lab 11/25/13 1744 11/26/13 0330  NA 140 141  K 4.3 4.9  CL 105  104  CO2  --  26  BUN 19 17  CREATININE 0.70 0.60  CALCIUM  --  9.5  PROT  --  7.8  BILITOT  --  0.5  ALKPHOS  --  56  ALT  --  9  AST  --  12  GLUCOSE 88 138*     Imaging/Diagnostic Tests: Dg Chest Portable 1 View  11/25/2013   CLINICAL DATA:  Shortness of breath.  EXAM: PORTABLE CHEST - 1 VIEW  COMPARISON:  Chest x-ray 11/10/2013.  , 08/21/2013, 09/23/2011.  FINDINGS: Mediastinum and hilar structures stable. Stable cardiomegaly. Stable severe diffuse pulmonary chronic interstitial lung disease. Pleural parenchymal thickening noted consistent with scarring. Cardiac pacer with lead tip over the right ventricle. No acute bony abnormality.  IMPRESSION: 1. Stable changes of severe chronic interstitial lung disease. 2. Stable cardiomegaly.  Cardiac pacer.   Electronically Signed   By: Marcello Moores  Register   On: 11/25/2013 16:48    Janora Norlander, DO 11/26/2013, 6:37 AM PGY-1, [place] Intern pager: [phone], text pages welcome

## 2013-11-26 NOTE — Progress Notes (Signed)
[facility]  Patient Status: Active (receiving services up to time of hospitalization)  [facility] is providing the following services: RN, PT, OT and MSW  If patient discharges after hours, please call [phone].   Rachel [name] 11/26/2013, 9:57 AM

## 2013-11-26 NOTE — Progress Notes (Signed)
Nutrition Brief Note  Patient identified on the Malnutrition Screening Tool (MST) Report for recent weight lost without trying and eating poorly because of a decreased appetite.  Per wt readings, patient has had a 6% weight loss sine end of April 2015 -- not significant for time frame.  Wt Readings from Last 15 Encounters:  11/26/13 165 lb 3.2 oz (74.934 kg)  11/25/13 163 lb 8 oz (74.163 kg)  11/13/13 162 lb 4.1 oz (73.6 kg)  11/01/13 165 lb (74.844 kg)  10/27/13 162 lb 1.6 oz (73.528 kg)  10/06/13 161 lb (73.029 kg)  09/27/13 162 lb (73.483 kg)  09/23/13 160 lb (72.576 kg)  08/26/13 170 lb 6.4 oz (77.293 kg)  08/17/13 174 lb 9.6 oz (79.198 kg)  08/12/13 173 lb (78.472 kg)  08/05/13 176 lb 8 oz (80.06 kg)  07/31/13 169 lb (76.658 kg)  06/10/13 179 lb (81.194 kg)  05/27/13 179 lb (81.194 kg)    Body mass index is 29.27 kg/(m^2). Patient meets criteria for Overweight based on current BMI.   Current diet order is Heart Healthy, patient is consuming approximately 100% of meals at this time. Labs and medications reviewed.   No nutrition interventions warranted at this time. If nutrition issues arise, please consult RD.   Arthur Holms, RD, LDN Pager #: [phone] After-Hours Pager #: [phone]

## 2013-11-26 NOTE — Consult Note (Signed)
Name: Rachel [name] MRN: [id number] DOB: 10/16/1960    ADMISSION DATE:  11/25/2013 CONSULTATION DATE:  8/21  REFERRING MD :  Marigene Ehlers  PRIMARY SERVICE:  FPTS   CHIEF COMPLAINT:  Acute on chronic respiratory failure   BRIEF PATIENT DESCRIPTION:   53 Y/O F with PMX of CHF, Pulmonary HTN, pulmonary sarcoidosis (f/b RB and also the HF clinic in out-pt setting on high flow O2, Revatio 2m tid and Tyvaso) last right heart cath showed PAP 51/24; pcwp 6, PVR 5. HTN,Depression.  Just d/c'd 8/8 for PNA (NOS) and decomp HF. Re-admitted on 8/20 w/ ambulatory desaturations to 70s on 4 liters, persistent cough w/ yellow to rust colored sputum and associated CP and occ chills.   SIGNIFICANT EVENTS   STUDIES:  4/22 ECHO: EF 60%, mild LVH, gd I d-dysfxn, nml RV systolic fxn 76/29LE dopplers: neg 7/21 VQ scan>>>   ANTIBIOTICS:  HISTORY OF PRESENT ILLNESS:  53Y/O F with PMX of CHF, Pulmonary HTN, pulmonary sarcoidosis (f/b RB and also the HF clinic in out-pt setting on high flow O2, Revatio 866mtid and Tyvaso) last right heart cath showed PAP 51/24; pcwp 6, PVR 5. HTN,Depression.  Presented to heart failure clinic on 8/20 for f/u and noted to have  sats drop into the 70s during ambulation on 4 liters and DOE.  She stated since discharge from the hospital 2 wks prior she had not been able to reach her breathing baseline. She endorsed SOB with chest tightness and cough (prod yellow-->rust colored sputum), at times she coughs out thick mucus. She denied any fever. She normally uses O2 about 4-6 L at home and with that her home O2 sat checked has been been 60-70. She was admitted for acute on chronic respiratory failure. PCXR looks like some extent of acute on chronic interstitial changes. PCCM asked to see and assess if anything else might be available from pulmonary standpoint to add.   PAST MEDICAL HISTORY :  Past Medical History  Diagnosis Date  . GERD (gastroesophageal reflux disease)   . Hypertension    . 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 YoAnnamaria Boots. CHF (congestive heart failure)     Right side- Dr. McAundra DubinLabuer)  . Pulmonary HTN   . Anxiety   . Cardiomyopathy   . NSVT (nonsustained ventricular tachycardia)     with syncope: St Jude ICD was implanted. Device now nearing ERI. Given end stage lung disease and normalization of LV function, the device will not be replaced and tachy therapies were turned off  . Chronic chest pain     LHC (08/2008) with no angiographic coronary diease  . Allergic rhinitis   . H/O: iron deficiency anemia   . Secondary amenorrhea     irregular menses  . Psychosis     with high dose steroids  . SVT (supraventricular tachycardia)     possible runs  . Hypokalemia     recurrent  . Rotator cuff sprain   . Chronic back pain    Past Surgical History  Procedure Laterality Date  . Cardiac catheterization  08/26/2008    5% EF with normal coronary arteries and normal wall motion  . Adenosine cardiolyte  03/31/2003    no ischemia/infarct; marked global LV hypekinesis; resting EF 22%  . Cardiac catheterization  03/31/2003    globally depressed LV fxn with EF 20%; idiopathic dilated cardiomyopathy  . Defibrillator placed  03/31/2003    St. Jude single chamber (Dr. GrCarleene Overlie  Lovena Le)  . Pfts      FVC 2000 (56%) FEV1 1400 (47%), ratio 0.7; insignif response to bronchodilatior; stable from 1/05 - 9/05; PFTs: Mod restriction, FVC 1.9L, FEV1 1.6L, both 53% predicted; slt response to brochodilators 04/08/2002   Prior to Admission medications   Medication Sig Start Date End Date Taking? Authorizing Provider  albuterol (PROAIR HFA) 108 (90 BASE) MCG/ACT inhaler Inhale 2 puffs into the lungs 4 (four) times daily. 10/01/13  Yes Willeen Niece, MD  ARIPiprazole (ABILIFY) 2 MG tablet Take 1 tablet (2 mg total) by mouth every other day. 11/01/13  Yes Sharon Mt Street, MD  aspirin EC 81 MG tablet Take 1 tablet (81 mg total) by mouth daily. 08/21/13  Yes Waylan Boga, NP    budesonide-formoterol (SYMBICORT) 160-4.5 MCG/ACT inhaler Inhale 1 puff into the lungs 2 (two) times daily. 11/03/13  Yes Sharon Mt [address], MD  esomeprazole (NEXIUM) 40 MG capsule Take 40 mg by mouth every morning. 11/13/13  Yes Sharon Mt Street, MD  furosemide (LASIX) 40 MG tablet Take 40 mg by mouth daily. 11/13/13  Yes Sharon Mt Street, MD  metoprolol tartrate (LOPRESSOR) 25 MG tablet Take 25 mg by mouth 2 (two) times daily. 11/13/13  Yes Sharon Mt Street, MD  PARoxetine (PAXIL) 20 MG tablet Take 1 tablet (20 mg total) by mouth daily. 11/03/13  Yes Sharon Mt Street, MD  potassium chloride SA (K-DUR,KLOR-CON) 20 MEQ tablet Take 1-2 tablets (20-40 mEq total) by mouth 2 (two) times daily. 40 meq in the morning and 20 meq at night 11/03/13  Yes Sharon Mt Street, MD  sildenafil (REVATIO) 20 MG tablet Take 4 tablets (80 mg total) by mouth 3 (three) times daily. take 4  tablets every 8 hours 08/21/13  Yes Waylan Boga, NP  Treprostinil (TYVASO) 0.6 MG/ML SOLN Inhale 18 mcg into the lungs 4 (four) times daily. 10/18/13  Yes Larey Dresser, MD   Allergies  Allergen Reactions  . Latex Rash  . Nitroglycerin Other (See Comments)    Patient is on Revatio  . Other Other (See Comments)    High Dose Steroids cause chemical imbalance and altered mental status  . Meperidine Hcl Other (See Comments)    REACTION: Makes her feel funny  . Tramadol Hcl Other (See Comments)    REACTION: nausea, lightheadedness, sleepiness, and dizziness  . Ace Inhibitors Other (See Comments)    unknown  . Morphine Other (See Comments)    Other   . Propoxyphene N-Acetaminophen Rash    FAMILY HISTORY:  Family History  Problem Relation Age of Onset  . Lung cancer Father   . Cancer Father     lung cancer  . Hypertension    . Diabetes Mother     border line  . Hypertension Mother   . Heart failure Mother    SOCIAL HISTORY:  reports that she quit smoking about 21 years ago. Her smoking use included  Cigarettes. She has a 16.5 pack-year smoking history. She has never used smokeless tobacco. She reports that she drinks alcohol. She reports that she does not use illicit drugs.  REVIEW OF SYSTEMS Bolds positive:   Constitutional: Negative for fever, chills, weight loss, malaise/fatigue and diaphoresis.  HENT: Negative for hearing loss, ear pain, nosebleeds, congestion, sore throat, neck pain, tinnitus and ear discharge.   Eyes: Negative for blurred vision, double vision, photophobia, pain, discharge and redness.  Respiratory: Negative for cough, hemoptysis, sputum production, shortness of breath, wheezing and stridor.   Cardiovascular:  Negative for chest pain w/ cough, palpitations, orthopnea, claudication, leg swelling and PND.  Gastrointestinal: Negative for heartburn, nausea, vomiting, abdominal pain, diarrhea, constipation, blood in stool and melena.  Genitourinary: Negative for dysuria, urgency, frequency, hematuria and flank pain.  Musculoskeletal: Negative for myalgias, back pain, joint pain and falls.  Skin: Negative for itching and rash.  Neurological: Negative for dizziness, tingling, tremors, sensory change, speech change, focal weakness, seizures, loss of consciousness, weakness and headaches.  Endo/Heme/Allergies: Negative for environmental allergies and polydipsia. Does not bruise/bleed easily.  SUBJECTIVE:  No better  VITAL SIGNS: Temp:  [97.1 F (36.2 C)-99.2 F (37.3 C)] 97.2 F (36.2 C) (08/21 0838) Pulse Rate:  [63-99] 79 (08/21 0902) Resp:  [13-33] 20 (08/21 0838) BP: (103-142)/(49-85) 126/55 mmHg (08/21 0902) SpO2:  [81 %-100 %] 88 % (08/21 0854) FiO2 (%):  [55 %] 55 % (08/21 0854) Weight:  [72.802 kg (160 lb 8 oz)-74.934 kg (165 lb 3.2 oz)] 74.934 kg (165 lb 3.2 oz) (08/21 0352)  PHYSICAL EXAMINATION: General:  Not in acute distress.  Neuro:  Awake, oriented, no focal def HEENT:  [place], no JVD  Cardiovascular:  rrr Lungs:  Posterior rales  Abdomen:  Soft,  non-tender  Musculoskeletal:  Intact  Skin:  LE edema    Recent Labs Lab 11/25/13 1744 11/26/13 0330  NA 140 141  K 4.3 4.9  CL 105 104  CO2  --  26  BUN 19 17  CREATININE 0.70 0.60  GLUCOSE 88 138*    Recent Labs Lab 11/25/13 1744 11/25/13 1800 11/26/13 0330  HGB 11.2* 10.2* 10.1*  HCT 33.0* 32.2* 33.6*  WBC  --  10.3 8.8  PLT  --  258 211   Dg Chest Portable 1 View  11/25/2013   CLINICAL DATA:  Shortness of breath.  EXAM: PORTABLE CHEST - 1 VIEW  COMPARISON:  Chest x-ray 11/10/2013.  , 08/21/2013, 09/23/2011.  FINDINGS: Mediastinum and hilar structures stable. Stable cardiomegaly. Stable severe diffuse pulmonary chronic interstitial lung disease. Pleural parenchymal thickening noted consistent with scarring. Cardiac pacer with lead tip over the right ventricle. No acute bony abnormality.  IMPRESSION: 1. Stable changes of severe chronic interstitial lung disease. 2. Stable cardiomegaly.  Cardiac pacer.   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 D dx: flare of sarcoid, she states this presentation has been similar to last flare, HCAP, or simply pulmonary edema. May be combo of all... Doubtful that her hypoxia is explained by thromboembolic disease as her CXR abnormalities should be adequate to explain her hypoxia. Although reasonable to ck V/q scan.   Plan/rec: Agree w/ diuresis Ck BNP/ESR/PCT/ACE level  Empiric HCAP coverage (Vanc and ceftaz) for now but d/c if sputum neg and PCT nml She wants to hold off on systemic steroids but is agreeable if above eval negative  F/u VQ scan  Will likely need oxymizer  for home to deliever high flow O2 Cont tyvaso and revatio   Patient seen and examined, agree with above note.  I dictated the care and orders written for this patient under my direction.  Rush Farmer, M.D. [facility] Pulmonary/Critical Care Medicine. Pager: [phone]. After hours pager: [phone].  11/26/2013, 9:31 AM

## 2013-11-26 NOTE — Progress Notes (Signed)
Patient ID: Rachel [name], female   DOB: [birth year], 53 y.o.   MRN: [id number]   SUBJECTIVE: Patient feels better this morning on venturi mask, oxygen saturation currently 100%.    Scheduled Meds: . albuterol  2.5 mg Nebulization TID  . [START ON 11/27/2013] ARIPiprazole  2 mg Oral QODAY  . aspirin EC  81 mg Oral Daily  . budesonide-formoterol  1 puff Inhalation BID  . enoxaparin (LOVENOX) injection  1 mg/kg Subcutaneous Q12H  . furosemide  40 mg Intravenous BID  . metoprolol tartrate  25 mg Oral BID  . pantoprazole  80 mg Oral Q1200  . PARoxetine  20 mg Oral Daily  . potassium chloride  20 mEq Oral QHS  . potassium chloride SA  40 mEq Oral Daily  . predniSONE  10 mg Oral Q breakfast  . sildenafil  80 mg Oral 3 times per day  . sodium chloride  3 mL Intravenous Q12H  . sodium chloride  3 mL Intravenous Q12H  . Treprostinil  18 mcg Inhalation QID   Continuous Infusions:  PRN Meds:.sodium chloride, acetaminophen, acetaminophen, albuterol, ondansetron (ZOFRAN) IV, ondansetron, sodium chloride    Filed Vitals:   11/25/13 2324 11/25/13 2344 11/25/13 2350 11/26/13 0352  BP: 103/62 108/49  110/75  Pulse: 77 88  99  Temp:  97.1 F (36.2 C)  98.5 F (36.9 C)  TempSrc:  Axillary  Oral  Resp:  18  20  Height:      Weight:    165 lb 3.2 oz (74.934 kg)  SpO2:  94% 99% 91%    Intake/Output Summary (Last 24 hours) at 11/26/13 0749 Last data filed at 11/26/13 0300  Gross per 24 hour  Intake      3 ml  Output    300 ml  Net   -297 ml    LABS: Basic Metabolic Panel:  Recent Labs  11/25/13 1744 11/26/13 0330  NA 140 141  K 4.3 4.9  CL 105 104  CO2  --  26  GLUCOSE 88 138*  BUN 19 17  CREATININE 0.70 0.60  CALCIUM  --  9.5   Liver Function Tests:  Recent Labs  11/26/13 0330  AST 12  ALT 9  ALKPHOS 56  BILITOT 0.5  PROT 7.8  ALBUMIN 3.4*   No results found for this basename: LIPASE, AMYLASE,  in the last 72 hours CBC:  Recent Labs  11/25/13 1800 11/26/13 0330   WBC 10.3 8.8  NEUTROABS 7.0  --   HGB 10.2* 10.1*  HCT 32.2* 33.6*  MCV 90.7 94.1  PLT 258 211   Cardiac Enzymes: No results found for this basename: CKTOTAL, CKMB, CKMBINDEX, TROPONINI,  in the last 72 hours BNP: No components found with this basename: POCBNP,  D-Dimer:  Recent Labs  11/25/13 1800  DDIMER 0.51*   Hemoglobin A1C: No results found for this basename: HGBA1C,  in the last 72 hours Fasting Lipid Panel: No results found for this basename: CHOL, HDL, LDLCALC, TRIG, CHOLHDL, LDLDIRECT,  in the last 72 hours Thyroid Function Tests: No results found for this basename: TSH, T4TOTAL, FREET3, T3FREE, THYROIDAB,  in the last 72 hours Anemia Panel: No results found for this basename: VITAMINB12, FOLATE, FERRITIN, TIBC, IRON, RETICCTPCT,  in the last 72 hours  RADIOLOGY: Dg Chest Portable 1 View  11/25/2013   CLINICAL DATA:  Shortness of breath.  EXAM: PORTABLE CHEST - 1 VIEW  COMPARISON:  Chest x-ray 11/10/2013.  , 08/21/2013, 09/23/2011.  FINDINGS: Mediastinum  and hilar structures stable. Stable cardiomegaly. Stable severe diffuse pulmonary chronic interstitial lung disease. Pleural parenchymal thickening noted consistent with scarring. Cardiac pacer with lead tip over the right ventricle. No acute bony abnormality.  IMPRESSION: 1. Stable changes of severe chronic interstitial lung disease. 2. Stable cardiomegaly.  Cardiac pacer.   Electronically Signed   By: [place]   On: 11/25/2013 16:48   Dg Chest Port 1 View  11/10/2013   CLINICAL DATA:  Shortness of breath. Sarcoidosis. Pulmonary hypertension.  EXAM: PORTABLE CHEST - 1 VIEW  COMPARISON:  10/24/2013 and 09/20/2013 and 09/23/2011  FINDINGS: AICD in place. The patient has severe chronic lung disease consistent with sarcoidosis. There is chronic enlargement of the main pulmonary arteries consistent with the history of pulmonary arterial hypertension. There is haziness in both lung bases which is essentially unchanged  since the prior exam.  IMPRESSION: Severe chronic lung disease. No significant change since the prior study.   Electronically Signed   By: Rozetta Nunnery M.D.   On: 11/10/2013 19:18    PHYSICAL EXAM General: NAD Neck: JVP 8-9 cm, no thyromegaly or thyroid nodule.  Lungs: Crackles (mild) bilaterally CV: Nondisplaced PMI.  Heart regular S1/S2, no S3/S4, no murmur.  Trace ankle edema.  No carotid bruit.  Normal pedal pulses.  Abdomen: Soft, nontender, no hepatosplenomegaly, no distention.  Neurologic: Alert and oriented x 3.  Psych: Normal affect. Extremities: No clubbing or cyanosis.   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  She had a recent admission with acute/chronic respiratory failure due to ?acute bronchitis superimposed on severe interstitial lung disease.  1. Acute on chronic respiratory failure: CXR with severe interstitial lung disease, no changes.  She is not febrile though describes subjective fevers at home and WBCs not elevated.  She does not look markedly volume overloaded on exam.  I am concerned that this may not necessarily represent an acute change but may be progression of her end-stage lung disease.  I will diurese her gently with Lasix 40 mg IV bid to see if this helps.  She had a mildly elevated D dimer so agree with V/Q scan.  Will need assistance of pulmonary service here.  2. Pulmonary arterial HTN: Had been doing well on Tyvaso and Revatio.  Last echo in 4/15 showed improvement in RV function.   [name] 11/26/2013 7:55 AM

## 2013-11-26 NOTE — Progress Notes (Signed)
FMTS Attending Daily Note:  Annabell Sabal MD  [phone] pager  Family Practice pager:  [phone] I have discussed this patient with the resident Dr. Lajuana Ripple and attending physician Dr. Gwendlyn Deutscher.  I agree with their findings, assessment, and care plan

## 2013-11-26 NOTE — Progress Notes (Signed)
PT Cancellation Note  Patient Details Name: Rachel [name] MRN: [id number] DOB: 06-08-1960   Cancelled Treatment:    Reason Eval/Treat Not Completed: Medical issues which prohibited therapy. V/Q scan still pending. Will follow-up 8/22.   Cristella Stiver 11/26/2013, 1:41 PM Pager [phone]

## 2013-11-26 NOTE — Progress Notes (Signed)
PT Cancellation Note  Patient Details Name: Rachel [name] MRN: [id number] DOB: 1961/03/16   Cancelled Treatment:    Reason Eval/Treat Not Completed: Medical issues which prohibited therapy. Noted awaiting V/Q scan due to elevated d-dimer. Will defer PT evaluation until test complete and results known. Will follow.   Quincee Gittens 11/26/2013, 8:06 AM Pager [phone]

## 2013-11-26 NOTE — Progress Notes (Signed)
OT Cancellation Note  Patient Details Name: ALEXY BRINGLE MRN: [id number] DOB: September 17, 1960   Cancelled Treatment:    Reason Eval/Treat Not Completed: Medical issues which prohibited therapy - pt awaiting VQ scan.  Will proceed once that performed and negative.   Darlina Rumpf [place], OTR/L [phone]  11/26/2013, 11:48 AM

## 2013-11-26 NOTE — H&P (Addendum)
FMTS ATTENDING ADMISSION NOTE Samaiya Awadallah,MD I  have seen and examined this patient, reviewed their chart. I have discussed this patient with the resident. I agree with the resident's findings, assessment and care plan.  53 Y/O F with PMX of CHF, Pulmonary HTN, pulmonary sarcoidosis, HTN,Depression presenting with SOB and low O2 saturation, she stated since discharge from the hospital 2 wks ago she had not been able to reach her breathing baseline. She endorses SOB with chest tightness and cough, at times she coughs out thick mucus. She denies any fever. She normally uses O2 about 4-6 L at home and with that her home O2 sat checked has been been 60-70. She was seen by her cardiologist yesterday who sent her to the hospital for assessment. She is yet to follow up with pulmonologist as out patient, she stated her appointment is not until next week.  No current facility-administered medications on file prior to encounter.   Current Outpatient Prescriptions on File Prior to Encounter  Medication Sig Dispense Refill  . albuterol (PROAIR HFA) 108 (90 BASE) MCG/ACT inhaler Inhale 2 puffs into the lungs 4 (four) times daily.  18 g  11  . ARIPiprazole (ABILIFY) 2 MG tablet Take 1 tablet (2 mg total) by mouth every other day.  30 tablet  1  . aspirin EC 81 MG tablet Take 1 tablet (81 mg total) by mouth daily.  1 tablet  0  . budesonide-formoterol (SYMBICORT) 160-4.5 MCG/ACT inhaler Inhale 1 puff into the lungs 2 (two) times daily.  1 Inhaler  3  . PARoxetine (PAXIL) 20 MG tablet Take 1 tablet (20 mg total) by mouth daily.  30 tablet  2  . potassium chloride SA (K-DUR,KLOR-CON) 20 MEQ tablet Take 1-2 tablets (20-40 mEq total) by mouth 2 (two) times daily. 40 meq in the morning and 20 meq at night  90 tablet  3  . sildenafil (REVATIO) 20 MG tablet Take 4 tablets (80 mg total) by mouth 3 (three) times daily. take 4  tablets every 8 hours  10 tablet  0  . Treprostinil (TYVASO) 0.6 MG/ML SOLN Inhale 18 mcg into  the lungs 4 (four) times daily.  3.6 mL  11   Past Medical History  Diagnosis Date  . GERD (gastroesophageal reflux disease)   . Hypertension   . 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 CHF (congestive heart failure)     Right side- Dr. Aundra Dubin ([facility])  . Pulmonary HTN   . Anxiety   . Cardiomyopathy   . NSVT (nonsustained ventricular tachycardia)     with syncope: St Jude ICD was implanted. Device now nearing ERI. Given end stage lung disease and normalization of LV function, the device will not be replaced and tachy therapies were turned off  . Chronic chest pain     LHC (08/2008) with no angiographic coronary diease  . Allergic rhinitis   . H/O: iron deficiency anemia   . Secondary amenorrhea     irregular menses  . Psychosis     with high dose steroids  . SVT (supraventricular tachycardia)     possible runs  . Hypokalemia     recurrent  . Rotator cuff sprain   . Chronic back pain    Filed Vitals:   11/25/13 2324 11/25/13 2344 11/25/13 2350 11/26/13 0352  BP: 103/62 108/49  110/75  Pulse: 77 88  99  Temp:  97.1 F (36.2 C)  98.5 F (36.9 C)  TempSrc:  Axillary  Oral  Resp:  18  20  Height:      Weight:    165 lb 3.2 oz (74.934 kg)  SpO2:  94% 99% 91%   Exam: Gen: Awake and alert, calm in bed. HEENT: EOMI, PERRLA. Resp:Not in respiratory distress. Air entry equal B/L and clear, no wheezing. CV: S1 S2 normal, no murmur. Abd: Soft, NT/ND, BS+ Ext: No edema. Acyanotic.  A/P: 53 Y/O F with 1. Hypoxemia: Secondary to Pulmonary HTN,Pulmonary sarcoidosis vs CHF.                         D-Dimer elevated R/O PE.                         VQ scan ordered for this morning.                         Pending test will plan to treat with anticoagulant.                         Continue Albuterol, Prednisone 10 mg, Sildenafil and Tyvaso.                         Monitor O2 requirement.                         Currently stable on Ventimask.                         Pulmonology  consult recommended.                         Cardiology f/u as well. Lasix 40 mg BID for chronic right sided CHF.                         Monitor input and output.  2.  HTN: Stable on home medication, continue monitoring on that for now.  Disposition: Monitor for improvement. Pending pulmonology assessment. Signed off to Annabell Sabal, MD today at 7:55am.

## 2013-11-26 NOTE — Progress Notes (Signed)
Family Medicine PGY-3 PCP Note  Chart reviewed and pt discussed with admitting residents; Rachel [name] has numerous issues contributing to her hospitalizations, including significant lung pathology and CHF. I have been and will continue to be directly involved in Rachel [name]'s care as part of the current FPTS inpatient team; please see her daily progress notes for details of her care. I appreciate the care of all FPTS providers and all consultants as well as all nursing and other ancillary staff on behalf of my patient.  Rachel Kluver, MD PGY-3, [facility] 11/26/2013, 8:25 AM FPTS Service pager: [phone] (text pages welcome through [facility], [place])

## 2013-11-27 DIAGNOSIS — J9621 Acute and chronic respiratory failure with hypoxia: Secondary | ICD-10-CM

## 2013-11-27 DIAGNOSIS — F323 Major depressive disorder, single episode, severe with psychotic features: Secondary | ICD-10-CM

## 2013-11-27 LAB — BASIC METABOLIC PANEL
Anion gap: 12 (ref 5–15)
BUN: 13 mg/dL (ref 6–23)
CO2: 32 meq/L (ref 19–32)
Calcium: 9.4 mg/dL (ref 8.4–10.5)
Chloride: 98 mEq/L (ref 96–112)
Creatinine, Ser: 0.7 mg/dL (ref 0.50–1.10)
GFR calc Af Amer: >90 mL/min (ref >90)
GFR calc non Af Amer: >90 mL/min (ref >90)
GLUCOSE: 88 mg/dL (ref 70–99)
Potassium: 3.9 mEq/L (ref 3.7–5.3)
SODIUM: 142 meq/L (ref 137–147)

## 2013-11-27 LAB — CBC
HEMATOCRIT: 34.7 % — AB (ref 36.0–46.0)
HEMOGLOBIN: 11 g/dL — AB (ref 12.0–15.0)
MCH: 28.6 pg (ref 26.0–34.0)
MCHC: 31.7 g/dL (ref 30.0–36.0)
MCV: 90.4 fL (ref 78.0–100.0)
Platelets: 286 10*3/uL (ref 150–400)
RBC: 3.84 MIL/uL — ABNORMAL LOW (ref 3.87–5.11)
RDW: 14.6 % (ref 11.5–15.5)
WBC: 6.1 10*3/uL (ref 4.0–10.5)

## 2013-11-27 LAB — ANGIOTENSIN CONVERTING ENZYME: ANGIOTENSIN-CONVERTING ENZYME: 31 U/L (ref 8–52)

## 2013-11-27 MED ORDER — SODIUM CHLORIDE 0.9 % IV BOLUS (SEPSIS)
500.0000 mL | Freq: Once | INTRAVENOUS | Status: AC
  Administered 2013-11-27: 500 mL via INTRAVENOUS

## 2013-11-27 MED ORDER — FUROSEMIDE 40 MG PO TABS
40.0000 mg | ORAL_TABLET | Freq: Every day | ORAL | Status: DC
  Administered 2013-11-28 – 2013-11-29 (×2): 40 mg via ORAL
  Filled 2013-11-27 (×2): qty 1

## 2013-11-27 NOTE — Progress Notes (Signed)
BP's hypotensive. 89/47, 83/40, 84/45.  HR 60's-70's. Patient complains of dizziness.  MD notified. 500cc NS bolus ordered and will be initiated.  Will continue to monitor.  M.Forest Gleason, RN

## 2013-11-27 NOTE — Progress Notes (Addendum)
FMTS Attending Daily Note:  Annabell Sabal MD  [phone] pager  Family Practice pager:  [phone] I have seen and examined this patient and have reviewed their chart. I have discussed this patient with the resident. I agree with the resident's findings, assessment and care plan.  Additionally:  - Overall improved today.  Now on nasal cannula.  Moving back towards baseline.   - Appreciate both Cards and Pulm input. - Can likely be transferred to floor today or tomorrow.  - Continue diuresis and abx.   - Previously, with involuntary tremor/jerking movement and increased DTRs of RLE.  These seem to have resolved this admit.  Will continue to watch for recurrence.    Alveda Reasons, MD 11/27/2013 11:08 AM

## 2013-11-27 NOTE — Progress Notes (Addendum)
Patient ID: Rachel [name], female   DOB: December 06, 1960, 53 y.o.   MRN: [id number]   SUBJECTIVE:   Diuresed well on IV lasix. Now on nasal cannula.   Scheduled Meds: . albuterol  2.5 mg Nebulization TID  . ARIPiprazole  2 mg Oral QODAY  . aspirin EC  81 mg Oral Daily  . budesonide-formoterol  1 puff Inhalation BID  . cefTAZidime (FORTAZ)  IV  1 g Intravenous 3 times per day  . enoxaparin (LOVENOX) injection  1 mg/kg Subcutaneous Q12H  . furosemide  40 mg Intravenous BID  . metoprolol tartrate  25 mg Oral BID  . pantoprazole  80 mg Oral Q1200  . PARoxetine  20 mg Oral Daily  . potassium chloride  20 mEq Oral QHS  . potassium chloride SA  40 mEq Oral Daily  . predniSONE  10 mg Oral Q breakfast  . sildenafil  80 mg Oral 3 times per day  . sodium chloride  3 mL Intravenous Q12H  . sodium chloride  3 mL Intravenous Q12H  . Treprostinil  18 mcg Inhalation QID  . vancomycin  1,000 mg Intravenous Q12H   Continuous Infusions:  PRN Meds:.sodium chloride, acetaminophen, acetaminophen, albuterol, ondansetron (ZOFRAN) IV, ondansetron, sodium chloride    Filed Vitals:   11/27/13 0400 11/27/13 0800 11/27/13 0912 11/27/13 1034  BP: 108/55 126/64  102/51  Pulse: 60 65  69  Temp:  98.3 F (36.8 C)    TempSrc:  Oral    Resp: 18 22    Height:      Weight:      SpO2: 95% 98% 97%     Intake/Output Summary (Last 24 hours) at 11/27/13 1100 Last data filed at 11/27/13 1023  Gross per 24 hour  Intake   1040 ml  Output   3501 ml  Net  -2461 ml    LABS: Basic Metabolic Panel:  Recent Labs  11/25/13 1744 11/26/13 0330  NA 140 141  K 4.3 4.9  CL 105 104  CO2  --  26  GLUCOSE 88 138*  BUN 19 17  CREATININE 0.70 0.60  CALCIUM  --  9.5   Liver Function Tests:  Recent Labs  11/26/13 0330  AST 12  ALT 9  ALKPHOS 56  BILITOT 0.5  PROT 7.8  ALBUMIN 3.4*   No results found for this basename: LIPASE, AMYLASE,  in the last 72 hours CBC:  Recent Labs  11/25/13 1800 11/26/13 0330   WBC 10.3 8.8  NEUTROABS 7.0  --   HGB 10.2* 10.1*  HCT 32.2* 33.6*  MCV 90.7 94.1  PLT 258 211   Cardiac Enzymes: No results found for this basename: CKTOTAL, CKMB, CKMBINDEX, TROPONINI,  in the last 72 hours BNP: No components found with this basename: POCBNP,  D-Dimer:  Recent Labs  11/25/13 1800  DDIMER 0.51*   Hemoglobin A1C: No results found for this basename: HGBA1C,  in the last 72 hours Fasting Lipid Panel: No results found for this basename: CHOL, HDL, LDLCALC, TRIG, CHOLHDL, LDLDIRECT,  in the last 72 hours Thyroid Function Tests: No results found for this basename: TSH, T4TOTAL, FREET3, T3FREE, THYROIDAB,  in the last 72 hours Anemia Panel: No results found for this basename: VITAMINB12, FOLATE, FERRITIN, TIBC, IRON, RETICCTPCT,  in the last 72 hours  RADIOLOGY: Dg Chest Portable 1 View  11/25/2013   CLINICAL DATA:  Shortness of breath.  EXAM: PORTABLE CHEST - 1 VIEW  COMPARISON:  Chest x-ray 11/10/2013.  , 08/21/2013,  09/23/2011.  FINDINGS: Mediastinum and hilar structures stable. Stable cardiomegaly. Stable severe diffuse pulmonary chronic interstitial lung disease. Pleural parenchymal thickening noted consistent with scarring. Cardiac pacer with lead tip over the right ventricle. No acute bony abnormality.  IMPRESSION: 1. Stable changes of severe chronic interstitial lung disease. 2. Stable cardiomegaly.  Cardiac pacer.   Electronically Signed   By: [place]   On: 11/25/2013 16:48   Dg Chest Port 1 View  11/10/2013   CLINICAL DATA:  Shortness of breath. Sarcoidosis. Pulmonary hypertension.  EXAM: PORTABLE CHEST - 1 VIEW  COMPARISON:  10/24/2013 and 09/20/2013 and 09/23/2011  FINDINGS: AICD in place. The patient has severe chronic lung disease consistent with sarcoidosis. There is chronic enlargement of the main pulmonary arteries consistent with the history of pulmonary arterial hypertension. There is haziness in both lung bases which is essentially unchanged  since the prior exam.  IMPRESSION: Severe chronic lung disease. No significant change since the prior study.   Electronically Signed   By: Rozetta Nunnery M.D.   On: 11/10/2013 19:18    PHYSICAL EXAM General: NAD on [place] 6l sats 97-99% Neck: JVP 5-6cm, no thyromegaly or thyroid nodule.  Lungs: Crackles (mild) bilaterally CV: Nondisplaced PMI.  Heart regular S1/S2, no S3/S4,  Prominent p2 no murmur.  No edema.  No carotid bruit.  Normal pedal pulses.  Abdomen: Soft, nontender, no hepatosplenomegaly, no distention.  Neurologic: Alert and oriented x 3.  Psych: Normal affect. Extremities: No clubbing or cyanosis.   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  She had a recent admission with acute/chronic respiratory failure due to ?acute bronchitis superimposed on severe interstitial lung disease.  1. Acute on chronic respiratory failure: CXR with severe interstitial lung disease, no changes. Likely mixed picture.  She is improved with IV diuresis, abx and pulmonary toilet. Will switch to po lasix. Appreciate CCM help. Given her underlying lung disease, VQ will likely not be helpful. Very low suspicion for PE. 2. Pulmonary arterial HTN: Had been doing well on Tyvaso and Revatio.  Last echo in 4/15 showed improvement in RV function. Continue combination therapy.   Glori Bickers MD 11/27/2013 11:00 AM

## 2013-11-27 NOTE — Progress Notes (Signed)
Family Medicine Teaching Service Daily Progress Note Intern Pager: [phone]  Patient name: Rachel [name] Medical record number: [id number] Date of birth: 04/21/1960 Age: 53 y.o. Gender: female  Primary Care Provider: Christa See, MD Consultants: Pulmonology Code Status: FULL  Pt Overview and Major Events to Date:  08/20: Admitted to telemetry floor 08/21: improvement in respiratory status, started vanc/ceftazidime per pulmonolgy 08/22: continued slow improvement, procalcitonin reassurring; pt adamantly uninterested in palliative care  Assessment and Plan: Rachel [name] is a 53 y.o. female presenting with hypoxia. PMH is significant for pulmonary sarcoidosis, pulmonary hypertension, cardiomyopathy, CHF, SVT, major depressive disorder with psychotic features, HTN   #Hypoxia: improving, now on [place] (on continuous O2 at home); very likely related to sarcoid and CHF; covering for possible HCAP, but no fevers, definite focal infiltrate on CXR, or WBC on labs - appreciate pulmonary assistance will f/u any further recs - continue telemetry and pulse oximetry - continue home Symbicort, Revatio, Tyvaso  - continue prednisone 10 mg (8/22 is day 2); may need to consider long-term steroids - elevated D-Dimer (0.51) but unable to obtain CTA d/t IV placement in left External Jugular; pt unable / unwilling to tolerate lying flat off O2 for VQ scan; continue Lovenox at treatment dose for now but strongly doubt PT - continue home CPAP QHS if able to maintain O2 sats  #CHF: Echo (07/2013): EF 16% Grade 1 diastolic dysfxn w/ mild dysolic dysfxn, PA systolic pressure [id number]. Does not appear fluid overloaded on exam. Appreciate heart failure team assistance - will f/u further cardiology recs - on Lasix 40 mg PO daily at home, with 40 mg IV BID here; likely will step back as resp status improves - consider repeat echo - continue home dose potassium; trend Cr and K with BMP's  - saline lock IV / limit  fluids  #Pulmonary HTN w/ RV failure: Moderate to severe; likely contributing to overall resp status - therapy as above; appreciate HF and pulm recs  #GERD - continue PPI (Protonix sub for home Nexium)  #Anxiety/Major depressive disorder - no current issues; continue home Abilify and Paxil  #HTN: controlled on home Metoprolol 25 BID   FEN/GI: SL IV, Protonix, Heart healthy diet  Prophylaxis: Sub-q heparin  Disposition: Remain in stepdown for now, with likely transfer to floor tomorrow with possible discharge in 1-2 days, pending further improvement  Subjective: Generally feeling slowly better. Breathing is better, back on [place]. Denies cough, fevers, abdominal pain. Has had some intermittent chest discomfort that she thinks is related to her breathing    Objective: Temp:  [97.5 F (36.4 C)-98.3 F (36.8 C)] 98.3 F (36.8 C) (08/22 0800) Pulse Rate:  [60-88] 65 (08/22 0800) Resp:  [15-25] 22 (08/22 0800) BP: (83-126)/(40-64) 126/64 mmHg (08/22 0800) SpO2:  [94 %-100 %] 97 % (08/22 0912) FiO2 (%):  [45 %-55 %] 45 % (08/21 2112) Weight:  [163 lb 8 oz (74.163 kg)] 163 lb 8 oz (74.163 kg) (08/22 0345) Physical Exam: General: AA female, sitting up in bed, NAD, [place] in place, venti mask on lap, eating breakfast  Cardiovascular: S1S2, RRR, no murmurs Respiratory: Diffuse crackles b/l lung spaces (unchanged from yesterday), no appreciable wheeze, speaks in full sentences, O2 saturations b/t 81-86% on 4L [place] and 91-99% on ventimask _0 %  Abdomen: obese, soft, ND, mild TTP in lower R and L quadrants, no guarding Extremities: WWP, no edema Neuro: moves extremities spontaneously, aaox3  Laboratory:  Recent Labs Lab 11/25/13 1744 11/25/13 1800 11/26/13 0330  WBC  --  10.3 8.8  HGB 11.2* 10.2* 10.1*  HCT 33.0* 32.2* 33.6*  PLT  --  258 211    Recent Labs Lab 11/25/13 1744 11/26/13 0330  NA 140 141  K 4.3 4.9  CL 105 104  CO2  --  26  BUN 19 17  CREATININE 0.70 0.60  CALCIUM  --   9.5  PROT  --  7.8  BILITOT  --  0.5  ALKPHOS  --  56  ALT  --  9  AST  --  12  GLUCOSE 88 138*     Imaging/Diagnostic Tests: Dg Chest Portable 1 View  11/25/2013   CLINICAL DATA:  Shortness of breath.  EXAM: PORTABLE CHEST - 1 VIEW  COMPARISON:  Chest x-ray 11/10/2013.  , 08/21/2013, 09/23/2011.  FINDINGS: Mediastinum and hilar structures stable. Stable cardiomegaly. Stable severe diffuse pulmonary chronic interstitial lung disease. Pleural parenchymal thickening noted consistent with scarring. Cardiac pacer with lead tip over the right ventricle. No acute bony abnormality.  IMPRESSION: 1. Stable changes of severe chronic interstitial lung disease. 2. Stable cardiomegaly.  Cardiac pacer.   Electronically Signed   By: Marcello Moores  Register   On: 11/25/2013 16:48    Emmaline Kluver, MD 11/27/2013, 10:23 AM PGY-1, [facility] Intern pager: [phone], text pages welcome

## 2013-11-28 DIAGNOSIS — J9620 Acute and chronic respiratory failure, unspecified whether with hypoxia or hypercapnia: Secondary | ICD-10-CM

## 2013-11-28 DIAGNOSIS — I959 Hypotension, unspecified: Secondary | ICD-10-CM

## 2013-11-28 DIAGNOSIS — F411 Generalized anxiety disorder: Secondary | ICD-10-CM

## 2013-11-28 LAB — BASIC METABOLIC PANEL
Anion gap: 9 (ref 5–15)
BUN: 16 mg/dL (ref 6–23)
CO2: 32 meq/L (ref 19–32)
Calcium: 9.5 mg/dL (ref 8.4–10.5)
Chloride: 99 mEq/L (ref 96–112)
Creatinine, Ser: 0.8 mg/dL (ref 0.50–1.10)
GFR calc Af Amer: >90 mL/min (ref >90)
GFR calc non Af Amer: 83 mL/min — ABNORMAL LOW (ref >90)
GLUCOSE: 85 mg/dL (ref 70–99)
Potassium: 4.4 mEq/L (ref 3.7–5.3)
SODIUM: 140 meq/L (ref 137–147)

## 2013-11-28 LAB — PROCALCITONIN
Procalcitonin: <0.1 ng/mL
Procalcitonin: <0.1 ng/mL

## 2013-11-28 MED ORDER — METOPROLOL TARTRATE 12.5 MG HALF TABLET
12.5000 mg | ORAL_TABLET | Freq: Two times a day (BID) | ORAL | Status: DC
  Filled 2013-11-28 (×2): qty 1

## 2013-11-28 MED ORDER — ENOXAPARIN SODIUM 40 MG/0.4ML ~~LOC~~ SOLN
40.0000 mg | SUBCUTANEOUS | Status: DC
  Administered 2013-11-29: 40 mg via SUBCUTANEOUS
  Filled 2013-11-28 (×2): qty 0.4

## 2013-11-28 NOTE — Progress Notes (Addendum)
FMTS Attending Daily Note:  Annabell Sabal MD  [phone] pager  Family Practice pager:  [phone] I have seen and examined this patient and have reviewed their chart. I have discussed this patient with the resident. I agree with the resident's findings, assessment and care plan.  Additionally:  - Improved today.  Agree with PO diuresis.  Appreciate cards/Pulm recs - Plan to transfer to floor today.  Watch BPs. Agree with reducing metoprolol.    Alveda Reasons, MD 11/28/2013 11:36 AM  Addendum:  In response to a coding query, I am addending this progress note to read that the patient has chronic diastolic heart failure, cause unknown. 12/14/2013 9:22 AM Alveda Reasons, MD

## 2013-11-28 NOTE — Progress Notes (Signed)
Entered patients room to let her know I would be placing her on CPAP and she refused. Will continue to monitor.

## 2013-11-28 NOTE — Progress Notes (Signed)
Patient ID: Rachel [name], female   DOB: Nov 10, 1960, 53 y.o.   MRN: [id number]   SUBJECTIVE:   Back on po diuretics. Weight stable. Breathing continues to improve.   Scheduled Meds: . albuterol  2.5 mg Nebulization TID  . ARIPiprazole  2 mg Oral QODAY  . aspirin EC  81 mg Oral Daily  . budesonide-formoterol  1 puff Inhalation BID  . [START ON 11/29/2013] enoxaparin (LOVENOX) injection  40 mg Subcutaneous Q24H  . furosemide  40 mg Oral Daily  . metoprolol tartrate  12.5 mg Oral BID  . pantoprazole  80 mg Oral Q1200  . PARoxetine  20 mg Oral Daily  . potassium chloride  20 mEq Oral QHS  . potassium chloride SA  40 mEq Oral Daily  . predniSONE  10 mg Oral Q breakfast  . sildenafil  80 mg Oral 3 times per day  . sodium chloride  3 mL Intravenous Q12H  . sodium chloride  3 mL Intravenous Q12H  . Treprostinil  18 mcg Inhalation QID   Continuous Infusions:  PRN Meds:.sodium chloride, acetaminophen, acetaminophen, albuterol, ondansetron (ZOFRAN) IV, ondansetron, sodium chloride    Filed Vitals:   11/28/13 0351 11/28/13 0700 11/28/13 0800 11/28/13 0829  BP: 93/42 96/46 104/47   Pulse: 65 62 97   Temp: 97.9 F (36.6 C)  97.5 F (36.4 C)   TempSrc: Oral  Oral   Resp: _0 Height:      Weight: 74.345 kg (163 lb 14.4 oz)     SpO2: 99% 100% 99% 98%    Intake/Output Summary (Last 24 hours) at 11/28/13 1201 Last data filed at 11/27/13 2117  Gross per 24 hour  Intake    540 ml  Output    400 ml  Net    140 ml    LABS: Basic Metabolic Panel:  Recent Labs  11/27/13 1045 11/28/13 0248  NA 142 140  K 3.9 4.4  CL 98 99  CO2 32 32  GLUCOSE 88 85  BUN 13 16  CREATININE 0.70 0.80  CALCIUM 9.4 9.5   Liver Function Tests:  Recent Labs  11/26/13 0330  AST 12  ALT 9  ALKPHOS 56  BILITOT 0.5  PROT 7.8  ALBUMIN 3.4*   No results found for this basename: LIPASE, AMYLASE,  in the last 72 hours CBC:  Recent Labs  11/25/13 1800 11/26/13 0330 11/27/13 1219  WBC  10.3 8.8 6.1  NEUTROABS 7.0  --   --   HGB 10.2* 10.1* 11.0*  HCT 32.2* 33.6* 34.7*  MCV 90.7 94.1 90.4  PLT 258 211 286   Cardiac Enzymes: No results found for this basename: CKTOTAL, CKMB, CKMBINDEX, TROPONINI,  in the last 72 hours BNP: No components found with this basename: POCBNP,  D-Dimer:  Recent Labs  11/25/13 1800  DDIMER 0.51*   Hemoglobin A1C: No results found for this basename: HGBA1C,  in the last 72 hours Fasting Lipid Panel: No results found for this basename: CHOL, HDL, LDLCALC, TRIG, CHOLHDL, LDLDIRECT,  in the last 72 hours Thyroid Function Tests: No results found for this basename: TSH, T4TOTAL, FREET3, T3FREE, THYROIDAB,  in the last 72 hours Anemia Panel: No results found for this basename: VITAMINB12, FOLATE, FERRITIN, TIBC, IRON, RETICCTPCT,  in the last 72 hours  RADIOLOGY: Dg Chest Portable 1 View  11/25/2013   CLINICAL DATA:  Shortness of breath.  EXAM: PORTABLE CHEST - 1 VIEW  COMPARISON:  Chest x-ray 11/10/2013.  , 08/21/2013,  09/23/2011.  FINDINGS: Mediastinum and hilar structures stable. Stable cardiomegaly. Stable severe diffuse pulmonary chronic interstitial lung disease. Pleural parenchymal thickening noted consistent with scarring. Cardiac pacer with lead tip over the right ventricle. No acute bony abnormality.  IMPRESSION: 1. Stable changes of severe chronic interstitial lung disease. 2. Stable cardiomegaly.  Cardiac pacer.   Electronically Signed   By: Aubrey   On: 11/25/2013 16:48   Dg Chest Port 1 View  11/10/2013   CLINICAL DATA:  Shortness of breath. Sarcoidosis. Pulmonary hypertension.  EXAM: PORTABLE CHEST - 1 VIEW  COMPARISON:  10/24/2013 and 09/20/2013 and 09/23/2011  FINDINGS: AICD in place. The patient has severe chronic lung disease consistent with sarcoidosis. There is chronic enlargement of the main pulmonary arteries consistent with the history of pulmonary arterial hypertension. There is haziness in both lung bases which is  essentially unchanged since the prior exam.  IMPRESSION: Severe chronic lung disease. No significant change since the prior study.   Electronically Signed   By: Rozetta Nunnery M.D.   On: 11/10/2013 19:18    PHYSICAL EXAM General: NAD on [place] sats 97-100% Neck: JVP 5-6cm, no thyromegaly or thyroid nodule.  Lungs: Crackles (mild) bilaterally CV: Nondisplaced PMI.  Heart regular S1/S2, no S3/S4,  Prominent p2 no murmur.  No edema.  No carotid bruit.  Normal pedal pulses.  Abdomen: Soft, nontender, no hepatosplenomegaly, no distention.  Neurologic: Alert and oriented x 3.  Psych: Normal affect. Extremities: No clubbing or cyanosis.   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  She had a recent admission with acute/chronic respiratory failure due to ?acute bronchitis superimposed on severe interstitial lung disease.  1. Acute on chronic respiratory failure: CXR with severe interstitial lung disease, no changes. Likely mixed picture.  She is improved with IV diuresis, abx and pulmonary toilet. Now back on po lasix. Appreciate CCM help. Given her underlying lung disease, VQ will likely not be helpful. Very low suspicion for PE. 2. Pulmonary arterial HTN: Had been doing well on Tyvaso and Revatio.  Last echo in 4/15 showed improvement in RV function. Continue combination therapy.   Glori Bickers MD 11/28/2013 12:01 PM

## 2013-11-28 NOTE — Progress Notes (Signed)
Family Medicine Teaching Service Daily Progress Note Intern Pager: [phone]  Patient name: Rachel [name] Medical record number: [id number] Date of birth: 06-15-1960 Age: 53 y.o. Gender: female  Primary Care Provider: Christa See, MD Consultants: Pulmonology Code Status: FULL  Pt Overview and Major Events to Date:  08/20: Admitted to telemetry floor 08/21: improvement in respiratory status, started vanc/ceftazidime per pulmonolgy 08/22: continued slow improvement, procalcitonin reassurring; pt adamantly uninterested in palliative care 8/23: improved breathing but relatively hypotensive (90's / upper 40's), restarting PO Lasix; abx stopped  Assessment and Plan: Rachel [name] is a 53 y.o. female presenting with hypoxia. PMH is significant for pulmonary sarcoidosis, pulmonary hypertension, cardiomyopathy, CHF, SVT, major depressive disorder with psychotic features, HTN   #Hypoxia: improved, now on [place] (on continuous O2 at home); very likely related to sarcoid and CHF - D/C'd vanc/ceftazidime as procalcitonin remains negative, pt is afebrile, and has no WBC - appreciate pulmonary assistance will f/u any further recs - continue telemetry and pulse oximetry - continue home Symbicort, Revatio, Tyvaso  - continue prednisone 10 mg (8/23 is day 3); may need to consider long-term steroids - strongly doubt PE, discontinue therapeutic Lovenox and start prophylaxis dose (elevated D-Dimer (0.51) but unable to obtain CTA d/t IV placement in left External Jugular; pt unable / unwilling to tolerate lying flat off O2 for VQ scan, and doubt utility in severe lung disease) - continue home CPAP qHS  #CHF: Echo (07/2013): EF 18% Grade 1 diastolic dysfxn w/ mild dysolic dysfxn, PA systolic pressure 29HBZJ. Does not appear fluid overloaded on exam. Appreciate heart failure team assistance - will f/u further cardiology recs; s/p Lasix IV 40 mg x3 doses - restarting Lasix 40 mg PO daily but need to watch BP  carefully - consider repeat echo - continue home dose potassium; trend Cr and K with BMP's  - saline lock IV / limit fluids  #Pulmonary HTN w/ RV failure: Moderate to severe; likely contributing to overall resp status - therapy as above; appreciate HF and pulm recs  #GERD - continue PPI (Protonix sub for home Nexium)  #Anxiety/Major depressive disorder - no current issues; continue home Abilify and Paxil  #HTN: relatively hyPOtensive, today; reduce metoprolol to 12.5 BID and monitor  FEN/GI: SL IV, Protonix, Heart healthy diet  Prophylaxis: Sub-q heparin  Disposition: Remain in stepdown for now, possibly transfer to floor later this afternoon if BP is stable / improves, with  possible discharge later today or tomorrow  Subjective: Pt states she is continues to feel slowly better but does have some dizziness this morning. Breathing is at her baseline, on home 4L [place]. Denies cough, fevers, abdominal pain. Intermittent chest discomfort with breathing is at baseline, as well.  Objective: Temp:  [97.5 F (36.4 C)-98.5 F (36.9 C)] 97.5 F (36.4 C) (08/23 0800) Pulse Rate:  [55-97] 97 (08/23 0800) Resp:  [16-24] 20 (08/23 0800) BP: (80-112)/(30-56) 104/47 mmHg (08/23 0800) SpO2:  [96 %-100 %] 99 % (08/23 0800) Weight:  [163 lb 14.4 oz (74.345 kg)] 163 lb 14.4 oz (74.345 kg) (08/23 0351) Physical Exam: General: AA female, lying in bed in NAD with [place] in place Cardio: RRR, no murmur appreciated Respiratory: diffuse coarse breath sounds slightly worse on right (but pt lying on right side initially sleeping, so likely dependent atelectasis), but good bilateral air movement and normal WOB, speaking in full sentences Abdomen: soft, nontender, BS+ Extremities: warm, well-perfused, trace LE edema bilaterally, improved Neuro: moves extremities spontaneously, no gross focal deficit  Laboratory:  Recent Labs Lab 11/25/13 1800 11/26/13 0330 11/27/13 1219  WBC 10.3 8.8 6.1  HGB 10.2* 10.1*  11.0*  HCT 32.2* 33.6* 34.7*  PLT 258 211 286    Recent Labs Lab 11/26/13 0330 11/27/13 1045 11/28/13 0248  NA 141 142 140  K 4.9 3.9 4.4  CL 104 98 99  CO2 26 32 32  BUN _0 CREATININE 0.60 0.70 0.80  CALCIUM 9.5 9.4 9.5  PROT 7.8  --   --   BILITOT 0.5  --   --   ALKPHOS 56  --   --   ALT 9  --   --   AST 12  --   --   GLUCOSE 138* 88 85    Imaging/Diagnostic Tests: No results found.  Emmaline Kluver, MD 11/28/2013, 8:28 AM PGY-3, [place] Intern pager: [phone], text pages welcome

## 2013-11-29 DIAGNOSIS — I5033 Acute on chronic diastolic (congestive) heart failure: Secondary | ICD-10-CM

## 2013-11-29 LAB — URINALYSIS, ROUTINE W REFLEX MICROSCOPIC
Bilirubin Urine: NEGATIVE
Glucose, UA: NEGATIVE mg/dL
HGB URINE DIPSTICK: NEGATIVE
Ketones, ur: NEGATIVE mg/dL
Leukocytes, UA: NEGATIVE
Nitrite: NEGATIVE
PROTEIN: NEGATIVE mg/dL
Specific Gravity, Urine: 1.012 (ref 1.005–1.030)
Urobilinogen, UA: 0.2 mg/dL (ref 0.0–1.0)
pH: 5.5 (ref 5.0–8.0)

## 2013-11-29 LAB — BASIC METABOLIC PANEL
Anion gap: 7 (ref 5–15)
BUN: 13 mg/dL (ref 6–23)
CO2: 32 meq/L (ref 19–32)
Calcium: 9.4 mg/dL (ref 8.4–10.5)
Chloride: 99 mEq/L (ref 96–112)
Creatinine, Ser: 0.65 mg/dL (ref 0.50–1.10)
GFR calc Af Amer: >90 mL/min (ref >90)
Glucose, Bld: 89 mg/dL (ref 70–99)
Potassium: 4.3 mEq/L (ref 3.7–5.3)
Sodium: 138 mEq/L (ref 137–147)

## 2013-11-29 MED ORDER — METOPROLOL TARTRATE 25 MG PO TABS: 12.5000 mg | ORAL_TABLET | Freq: Two times a day (BID) | ORAL | Status: DC

## 2013-11-29 MED ORDER — PREDNISONE 10 MG PO TABS: 10.0000 mg | ORAL_TABLET | Freq: Every day | ORAL | Status: DC

## 2013-11-29 MED ORDER — FUROSEMIDE 20 MG PO TABS
20.0000 mg | ORAL_TABLET | Freq: Every evening | ORAL | Status: DC
  Filled 2013-11-29: qty 1

## 2013-11-29 MED ORDER — FUROSEMIDE 20 MG PO TABS: ORAL_TABLET | ORAL | Status: DC

## 2013-11-29 NOTE — Evaluation (Addendum)
Physical Therapy Evaluation and Discharge Patient Details Name: Rachel [name] MRN: [id number] DOB: 24-Nov-1960 Today's Date: 11/29/2013   History of Present Illness  Adm 11/25/13 for dyspnea and cough. Has had multiple hospitalizations for same. PMHx-pulmonary sarcoidoisis, CHF, pulmonary HTN  Clinical Impression  Patient evaluated by Physical Therapy with no further acute PT needs identified. All education has been completed and the patient has no further questions. PT is signing off. Thank you for this referral.     Follow Up Recommendations No PT follow up    Equipment Recommendations  None recommended by PT    Recommendations for Other Services       Precautions / Restrictions        Mobility  Bed Mobility               General bed mobility comments: pt sitting EOB on arrival; reports she has no difficulty  Transfers Overall transfer level: Modified independent Equipment used: None             General transfer comment: uses UEs for sit to stand; no imbalance or dizziness  Ambulation/Gait Ambulation/Gait assistance: Min guard Ambulation Distance (Feet): 144 Feet Assistive device: None Gait Pattern/deviations: WFL(Within Functional Limits) Gait velocity: very slow due to respiratory status   General Gait Details: no imbalance; denied dizziness; standing rest x 1  Stairs            Wheelchair Mobility    Modified Rankin (Stroke Patients Only)       Balance Overall balance assessment: No apparent balance deficits (not formally assessed)                                           Pertinent Vitals/Pain Pain Assessment: No/denies pain At rest on 5L: HR 82 SaO2 100 With ambulation: HR 89 SaO2 93-100 on 6L    Home Living Family/patient expects to be discharged to:: Private residence Living Arrangements: Children Available Help at Discharge: Family;Available PRN/intermittently Type of Home: Apartment Home Access: Level entry     Home Layout: One level Home Equipment: Cane - single point;Walker - 4 wheels      Prior Function Level of Independence: Independent with assistive device(s)         Comments: used cane when legs are weak.     Hand Dominance   Dominant Hand: Right    Extremity/Trunk Assessment   Upper Extremity Assessment: Overall WFL for tasks assessed           Lower Extremity Assessment: Generalized weakness      Cervical / Trunk Assessment: Normal  Communication   Communication: No difficulties  Cognition Arousal/Alertness: Awake/alert Behavior During Therapy: WFL for tasks assessed/performed Overall Cognitive Status: Within Functional Limits for tasks assessed                      General Comments      Exercises Other Exercises Other Exercises: educated pt that currently best exercise is to ambulate (RN aware and agreed);  Other Exercises: educated on sit to stand at bedside or from chair as alternate exercise (RN also aware) Other Exercises: educated on LAQ (if feet down) and ankle pumps      Assessment/Plan    PT Assessment Patent does not need any further PT services  PT Diagnosis     PT Problem List    PT Treatment Interventions  PT Goals (Current goals can be found in the Care Plan section) Acute Rehab PT Goals PT Goal Formulation: No goals set, d/c therapy    Frequency     Barriers to discharge        Co-evaluation               End of Session Equipment Utilized During Treatment: Gait belt;Oxygen Activity Tolerance: Patient limited by fatigue Patient left: in chair;with call bell/phone within reach;with nursing/sitter in room Nurse Communication: Mobility status         Time: 9147-8295 PT Time Calculation (min): 23 min   Charges:   PT Evaluation $Initial PT Evaluation Tier I: 1 Procedure PT Treatments $Therapeutic Exercise: 8-22 mins   PT G Codes:          Malin Sambrano 12-14-2013, 9:39 AM Pager [phone]

## 2013-11-29 NOTE — Progress Notes (Signed)
   Name: Rachel [name] MRN: [id number] DOB: 12/14/1960    ADMISSION DATE:  11/25/2013 CONSULTATION DATE:  8/21  REFERRING MD :  Marigene Ehlers  PRIMARY SERVICE:  FPTS   CHIEF COMPLAINT:  Acute on chronic respiratory failure   BRIEF PATIENT DESCRIPTION:   53 Y/O F with PMX of CHF, Pulmonary HTN, pulmonary sarcoidosis (f/b RB and also the HF clinic in out-pt setting on high flow O2, Revatio 48m tid and Tyvaso) last right heart cath showed PAP 51/24; pcwp 6, PVR 5. HTN,Depression.  Just d/c'd 8/8 for PNA (NOS) and decomp HF. Re-admitted on 8/20 w/ ambulatory desaturations to 70s on 4 liters, persistent cough w/ yellow to rust colored sputum and associated CP and occ chills.   SIGNIFICANT EVENTS   STUDIES:  4/22 ECHO: EF 60%, mild LVH, gd I d-dysfxn, nml RV systolic fxn 76/30LE dopplers: neg    SUBJECTIVE:  No better  VITAL SIGNS: Temp:  [96.6 F (35.9 C)-98.3 F (36.8 C)] 97.6 F (36.4 C) (08/24 1212) Pulse Rate:  [61-148] 83 (08/24 1212) Resp:  [14-25] 25 (08/24 1212) BP: (90-120)/(43-63) 92/53 mmHg (08/24 1212) SpO2:  [94 %-100 %] 98 % (08/24 1212) Weight:  [73.9 kg (162 lb 14.7 oz)] 73.9 kg (162 lb 14.7 oz) (08/24 0402) 4 -6 liters  PHYSICAL EXAMINATION: General:  Not in acute distress.  Neuro:  Awake, oriented, no focal def HEENT:  [place], no JVD  Cardiovascular:  rrr Lungs:  Posterior rales no accessory muscle use  Abdomen:  Soft, non-tender  Musculoskeletal:  Intact  Skin:  LE edema    Recent Labs Lab 11/27/13 1045 11/28/13 0248 11/29/13 0328  NA 142 140 138  K 3.9 4.4 4.3  CL 98 99 99  CO2 32 32 32  BUN _0 CREATININE 0.70 0.80 0.65  GLUCOSE 88 85 89    Recent Labs Lab 11/25/13 1800 11/26/13 0330 11/27/13 1219  HGB 10.2* 10.1* 11.0*  HCT 32.2* 33.6* 34.7*  WBC 10.3 8.8 6.1  PLT 258 211 286   No results found.  ASSESSMENT / PLAN:  Acute on Chronic respiratory failure in setting of ES/ stage VI sarcoidosis w/ severe PAH RV failure (now nml rv fxn  on high flow O2, revatio and Tyvaso) gd I diastolic dysfxn  Pulmonary edema  Recent PNA (NOS), r/o HCAP -->doubt  HTN  GERD  Anxiety   Discussion  Her CXR did look like there was some acute on chronic interstitial changes. D dx: flare of sarcoid, she states this presentation has been similar to last flare, HCAP, or simply pulmonary edema. Edema being most likely as PCT was negative and improved w/ IV lasix. Now off abx and doing well.   Plan/rec: Cont lasix to figure out home dosing regimen  No change in current steroids.  Walking oximetry prior to d/c  Cont tyvaso and revatio  F/u with [place] d/c, but most-importantly will need close HF clinic f/u    11/29/2013, 2:20 PM  BRoselie Awkward MD LMernaPCCM Pager: [phone]: [phone] no response, call [phone]

## 2013-11-29 NOTE — Progress Notes (Signed)
Patient ID: Rachel [name], female   DOB: 1961-02-06, 53 y.o.   MRN: [id number]   SUBJECTIVE:   Now on po Lasix and off antibiotics.  Breathing back to baseline, on 4L [place] at rest.  Reports dysuria.    Scheduled Meds: . albuterol  2.5 mg Nebulization TID  . ARIPiprazole  2 mg Oral QODAY  . aspirin EC  81 mg Oral Daily  . budesonide-formoterol  1 puff Inhalation BID  . enoxaparin (LOVENOX) injection  40 mg Subcutaneous Q24H  . furosemide  40 mg Oral Daily  . pantoprazole  80 mg Oral Q1200  . PARoxetine  20 mg Oral Daily  . potassium chloride  20 mEq Oral QHS  . potassium chloride SA  40 mEq Oral Daily  . predniSONE  10 mg Oral Q breakfast  . sildenafil  80 mg Oral 3 times per day  . sodium chloride  3 mL Intravenous Q12H  . sodium chloride  3 mL Intravenous Q12H  . Treprostinil  18 mcg Inhalation QID   Continuous Infusions:  PRN Meds:.sodium chloride, acetaminophen, acetaminophen, albuterol, ondansetron (ZOFRAN) IV, ondansetron, sodium chloride    Filed Vitals:   11/28/13 2005 11/29/13 0000 11/29/13 0358 11/29/13 0402  BP: 100/51 120/59 117/61   Pulse: 88 78 61   Temp:  98 F (36.7 C) 97.7 F (36.5 C)   TempSrc:  Oral Oral   Resp: _0 Height:      Weight:    73.9 kg (162 lb 14.7 oz)  SpO2: 98% 98% 96%     Intake/Output Summary (Last 24 hours) at 11/29/13 0814 Last data filed at 11/29/13 0700  Gross per 24 hour  Intake    480 ml  Output   1225 ml  Net   -745 ml    LABS: Basic Metabolic Panel:  Recent Labs  11/28/13 0248 11/29/13 0328  NA 140 138  K 4.4 4.3  CL 99 99  CO2 32 32  GLUCOSE 85 89  BUN 16 13  CREATININE 0.80 0.65  CALCIUM 9.5 9.4   Liver Function Tests: No results found for this basename: AST, ALT, ALKPHOS, BILITOT, PROT, ALBUMIN,  in the last 72 hours No results found for this basename: LIPASE, AMYLASE,  in the last 72 hours CBC:  Recent Labs  11/27/13 1219  WBC 6.1  HGB 11.0*  HCT 34.7*  MCV 90.4  PLT 286   Cardiac  Enzymes: No results found for this basename: CKTOTAL, CKMB, CKMBINDEX, TROPONINI,  in the last 72 hours BNP: No components found with this basename: POCBNP,  D-Dimer: No results found for this basename: DDIMER,  in the last 72 hours Hemoglobin A1C: No results found for this basename: HGBA1C,  in the last 72 hours Fasting Lipid Panel: No results found for this basename: CHOL, HDL, LDLCALC, TRIG, CHOLHDL, LDLDIRECT,  in the last 72 hours Thyroid Function Tests: No results found for this basename: TSH, T4TOTAL, FREET3, T3FREE, THYROIDAB,  in the last 72 hours Anemia Panel: No results found for this basename: VITAMINB12, FOLATE, FERRITIN, TIBC, IRON, RETICCTPCT,  in the last 72 hours  RADIOLOGY: Dg Chest Portable 1 View  11/25/2013   CLINICAL DATA:  Shortness of breath.  EXAM: PORTABLE CHEST - 1 VIEW  COMPARISON:  Chest x-ray 11/10/2013.  , 08/21/2013, 09/23/2011.  FINDINGS: Mediastinum and hilar structures stable. Stable cardiomegaly. Stable severe diffuse pulmonary chronic interstitial lung disease. Pleural parenchymal thickening noted consistent with scarring. Cardiac pacer with lead tip over the  right ventricle. No acute bony abnormality.  IMPRESSION: 1. Stable changes of severe chronic interstitial lung disease. 2. Stable cardiomegaly.  Cardiac pacer.   Electronically Signed   By: [place]   On: 11/25/2013 16:48   Dg Chest Port 1 View  11/10/2013   CLINICAL DATA:  Shortness of breath. Sarcoidosis. Pulmonary hypertension.  EXAM: PORTABLE CHEST - 1 VIEW  COMPARISON:  10/24/2013 and 09/20/2013 and 09/23/2011  FINDINGS: AICD in place. The patient has severe chronic lung disease consistent with sarcoidosis. There is chronic enlargement of the main pulmonary arteries consistent with the history of pulmonary arterial hypertension. There is haziness in both lung bases which is essentially unchanged since the prior exam.  IMPRESSION: Severe chronic lung disease. No significant change since the  prior study.   Electronically Signed   By: Rozetta Nunnery M.D.   On: 11/10/2013 19:18    PHYSICAL EXAM General: NAD on [place] sats 97-100% Neck: JVP 5-6 cm, no thyromegaly or thyroid nodule.  Lungs: Crackles (mild) bilaterally CV: Nondisplaced PMI.  Heart regular S1/S2, no S3/S4,  Prominent p2 no murmur.  No edema.  No carotid bruit.  Normal pedal pulses.  Abdomen: Soft, nontender, no hepatosplenomegaly, no distention.  Neurologic: Alert and oriented x 3.  Psych: Normal affect. Extremities: No clubbing or cyanosis.   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  She had a recent admission with acute/chronic respiratory failure due to ?acute bronchitis superimposed on severe interstitial lung disease.  1. Acute on chronic respiratory failure: CXR with severe interstitial lung disease, no changes. Likely mixed picture.  She improved with IV diuresis, antibiotics, prednisone and pulmonary toilet. Now back on po lasix (will send home on Lasix 40 qam/20 qpm, slightly higher than home dose). Appreciate CCM help. Given her underlying lung disease, VQ will likely not be helpful. Very low suspicion for PE. She is off antibiotics with PCT < 0.1.  She is now on prednisone 10, would aim for a long taper of this, would like pulmonary guidance for whether this may need to be chronic.  2. Pulmonary arterial HTN: Had been doing well on Tyvaso and Revatio.  Last echo in 4/15 showed improvement in RV function. Continue combination therapy.   [name] MD 11/29/2013 8:14 AM

## 2013-11-29 NOTE — Discharge Summary (Signed)
[facility] Discharge Summary  Patient name: Rachel [name] Medical record number: [id number] Date of birth: 26-Nov-1960 Age: 53 y.o. Gender: female Date of Admission: 11/25/2013  Date of Discharge: 11/29/13 Admitting Physician: Andrena Mews, MD  Primary Care Provider: Christa See, MD Consultants: Cardiology, Pulmonology  Indication for Hospitalization: hypoxia  Discharge Diagnoses/Problem List:  Hypoxia Acute on chronic respiratory failure CHF HTN GERD Anxiety  Disposition: Discharge patient home.  Discharge Condition: stable.   Brief Hospital Course:  Rachel [name] is a 53 y.o. female presenting with hypoxia. PMH is significant for pulmonary sarcoidosis, pulmonary hypertension, cardiomyopathy, CHF, SVT, major depressive disorder with psychotic features, HTN.  On admission, patient was saturating at 100% on venturimask 6L _0 % O2.  In the ED, CXR and CBC was obtained.  Neither were remarkable for infectious etiology.  Patient was admitted to step down and continuous pulse ox was initiated, vitals were monitored.  Patient's Tyvaso, symbicort and revatio were continued.  She was also started on low dose prednisone (as she develops psychosis with doses greater than 26m).  PE was considered as patient had a slightly elevated D-dimer.  However, no S1Q3T3 changes were present on EKG and patient was not complaining of CP so this was not pursued as a likely etiology of her SOB.  Given patient's underlying pulmonary and cardiac diseases, pulmonology and cardiology were consulted.  Pulmonology's recommendation was to continue Prednisone 158min addition lasix, tyvaso and revatio.  Cardiology also recommended that her current therapies be continued, as patient's SOB improved with IV diuresis, antibiotic treatment and pulmonary toilet.  Of note, patient was empirically treated with 2 days of Vancomycin and 3 days of Ceftazidime.  She remained afebrile throughout  hospitalization and was weaned to her home dose of 5L of O2 nasal cannula.  Blood pressures were monitored.  Patient did become hypotensive (90's/upper 40's) on Metoprolol 25 BID and furosemide 40 BID.  Therefore, Metoprolol was decreased to 12.5 BID and Lasix was changed to her home dose of 4089maily.  Patient's BP improved. Patient was also continued on home Paxil and Abilify for depression/ anxiety during hospitalization.        Issues for Follow Up:   Consider f/u BMET (as Lasix dose was increased by cardiology)  Patient to f/u with pulmonology. Patient given 2 weeks of Prednisone 36m51mPlease evaluate if this will be a long term medication for patient.  Significant Procedures: none  Significant Labs and Imaging:   Recent Labs Lab 11/25/13 1800 11/26/13 0330 11/27/13 1219  WBC 10.3 8.8 6.1  HGB 10.2* 10.1* 11.0*  HCT 32.2* 33.6* 34.7*  PLT 258 211 286    Recent Labs Lab 11/25/13 1744 11/26/13 0330 11/27/13 1045 11/28/13 0248 11/29/13 0328  NA 140 141 142 140 138  K 4.3 4.9 3.9 4.4 4.3  CL 105 104 98 99 99  CO2  --  26 32 32 32  GLUCOSE 88 138* 88 85 89  BUN _1 CREATININE 0.70 0.60 0.70 0.80 0.65  CALCIUM  --  9.5 9.4 9.5 9.4  ALKPHOS  --  56  --   --   --   AST  --  12  --   --   --   ALT  --  9  --   --   --   ALBUMIN  --  3.4*  --   --   --     No results found.  Results/Tests  Pending at Time of Discharge: none  Discharge Medications:    Medication List         albuterol 108 (90 BASE) MCG/ACT inhaler  Commonly known as:  PROAIR HFA  Inhale 2 puffs into the lungs 4 (four) times daily.     ARIPiprazole 2 MG tablet  Commonly known as:  ABILIFY  Take 1 tablet (2 mg total) by mouth every other day.     aspirin EC 81 MG tablet  Take 1 tablet (81 mg total) by mouth daily.     budesonide-formoterol 160-4.5 MCG/ACT inhaler  Commonly known as:  SYMBICORT  Inhale 1 puff into the lungs 2 (two) times daily.     esomeprazole 40 MG capsule   Commonly known as:  NEXIUM  Take 40 mg by mouth every morning.     furosemide 20 MG tablet  Commonly known as:  LASIX  Take 2 tablets (35m) every morning and take 1 tablet (29m every evening.     metoprolol tartrate 25 MG tablet  Commonly known as:  LOPRESSOR  Take 0.5 tablets (12.5 mg total) by mouth 2 (two) times daily.     PARoxetine 20 MG tablet  Commonly known as:  PAXIL  Take 1 tablet (20 mg total) by mouth daily.     potassium chloride SA 20 MEQ tablet  Commonly known as:  K-DUR,KLOR-CON  Take 1-2 tablets (20-40 mEq total) by mouth 2 (two) times daily. 40 meq in the morning and 20 meq at night     predniSONE 10 MG tablet  Commonly known as:  DELTASONE  Take 1 tablet (10 mg total) by mouth daily with breakfast.     sildenafil 20 MG tablet  Commonly known as:  REVATIO  Take 4 tablets (80 mg total) by mouth 3 (three) times daily. take 4  tablets every 8 hours     Treprostinil 0.6 MG/ML Soln  Commonly known as:  TYVASO  Inhale 18 mcg into the lungs 4 (four) times daily.        Discharge Instructions: Please refer to Patient Instructions section of EMR for full details.  Patient was counseled important signs and symptoms that should prompt return to medical care, changes in medications, dietary instructions, activity restrictions, and follow up appointments.   Follow-Up Appointments: Follow-up Information   Follow up with BRWilleen NieceMD On 12/03/2013. (11:30am)    Specialty:  Family Medicine   Contact information:   [phone]     Follow up with BYCollene Gobble MD On 12/02/2013. (1:30 pm)    Specialty:  Pulmonary Disease   Contact information:   [id number] 11/29/2013, 6:31 PM PGY-1, [place]

## 2013-11-29 NOTE — Progress Notes (Addendum)
Pt ambulated in hallway with RN. Sats>97% on 5.5L. RN suggested to pt to reduce O2 to 4.5. Pt declined. Pt prepared for d/c with [facility] at 18:45.

## 2013-11-29 NOTE — Clinical Social Work Note (Signed)
Patient to discharge home on 11/29/13 Transportation by [facility]- called for 4:30 pick up  Domenica Reamer, [place] Social Worker [phone]

## 2013-11-29 NOTE — Progress Notes (Signed)
Family Medicine Teaching Service Daily Progress Note Intern Pager: [phone]  Patient name: Rachel [name] Medical record number: [id number] Date of birth: 05/30/1960 Age: 53 y.o. Gender: female  Primary Care Provider: Christa See, MD Consultants: Pulmonology Code Status: FULL  Pt Overview and Major Events to Date:  08/20: Admitted to telemetry floor 08/21: improvement in respiratory status, started vanc/ceftazidime per pulmonolgy 08/22: continued slow improvement, procalcitonin reassurring; pt adamantly uninterested in palliative care 8/23: improved breathing but relatively hypotensive (90's / upper 40's), restarting PO Lasix; abx stopped  Assessment and Plan: Rachel [name] is a 53 y.o. female presenting with hypoxia. PMH is significant for pulmonary sarcoidosis, pulmonary hypertension, cardiomyopathy, CHF, SVT, major depressive disorder with psychotic features, HTN   #Hypoxia: improved, now on [place] (on continuous O2 at home); very likely related to sarcoid and CHF - D/C'd vanc/ceftazidime as procalcitonin remains negative, pt is afebrile, and has no WBC - appreciate pulmonary assistance will f/u any further recs - continue telemetry and pulse oximetry - continue home Symbicort, Revatio, Tyvaso  - continue prednisone 10 mg (day #4); consider long-term steroids - strongly doubt PE, discontinue therapeutic Lovenox and start prophylaxis dose (elevated D-Dimer (0.51) but unable to obtain CTA d/t IV placement in left External Jugular; pt unable / unwilling to tolerate lying flat off O2 for VQ scan, and doubt utility in severe lung disease) - continue home CPAP qHS  #CHF: Echo (07/2013): EF 63% Grade 1 diastolic dysfxn w/ mild dysolic dysfxn, PA systolic pressure [id number]. Does not appear fluid overloaded on exam. Appreciate heart failure team assistance - will f/u further cardiology recs; s/p Lasix IV 40 mg x3 doses - Lasix 40 mg PO daily, watch BP carefully - consider repeat echo - continue  home dose potassium; trend Cr and K with BMP's  - saline lock IV / limit fluids  #Pulmonary HTN w/ RV failure: Moderate to severe; likely contributing to overall resp status - therapy as above; appreciate HF and pulm recs  #GERD - continue PPI (Protonix sub for home Nexium)  #Anxiety/Major depressive disorder - no current issues; continue home Abilify and Paxil  #HTN: BP 117/61 today, HR 61; metoprolol 12.5 BID and monitor  FEN/GI: SL IV, Protonix, Heart healthy diet  Prophylaxis: Sub-q heparin  Disposition:Discharge home.  Subjective:Patient reports that she is feeling better today and is ready for discharge.  She denies cough, SOB outside of normal, fevers, abdominal pain, CP.   Does report dysuria, denies hematuria.  However, UA is negative for infection.      Objective: Temp:  [97.5 F (36.4 C)-98.3 F (36.8 C)] 97.7 F (36.5 C) (08/24 0358) Pulse Rate:  [61-148] 61 (08/24 0358) Resp:  [14-26] 15 (08/24 0358) BP: (80-120)/(41-87) 117/61 mmHg (08/24 0358) SpO2:  [95 %-100 %] 96 % (08/24 0358) Weight:  [162 lb 14.7 oz (73.9 kg)] 162 lb 14.7 oz (73.9 kg) (08/24 0402) Physical Exam: General: AA female, sitting up in bed in NAD with [place] in place Cardio: RRR, no murmur appreciated Respiratory: mild crackles appreciated on R lung space, but good bilateral air movement and normal WOB, speaking in full sentences, [place] in place 5L O2, saturation 94% Abdomen: soft, nontender, BS+ Extremities: warm, well-perfused, trace LE edema bilaterally, improved Neuro: moves extremities spontaneously, no gross focal deficit  Laboratory:  Recent Labs Lab 11/25/13 1800 11/26/13 0330 11/27/13 1219  WBC 10.3 8.8 6.1  HGB 10.2* 10.1* 11.0*  HCT 32.2* 33.6* 34.7*  PLT 258 211 286    Recent Labs Lab  11/26/13 0330 11/27/13 1045 11/28/13 0248 11/29/13 0328  NA 141 142 140 138  K 4.9 3.9 4.4 4.3  CL 104 98 99 99  CO2 26 32 32 32  BUN _0 CREATININE 0.60 0.70 0.80 0.65  CALCIUM  9.5 9.4 9.5 9.4  PROT 7.8  --   --   --   BILITOT 0.5  --   --   --   ALKPHOS 56  --   --   --   ALT 9  --   --   --   AST 12  --   --   --   GLUCOSE 138* 88 85 89    Imaging/Diagnostic Tests: Dg Chest Portable 1 View  11/25/2013   CLINICAL DATA:  Shortness of breath.  EXAM: PORTABLE CHEST - 1 VIEW  COMPARISON:  Chest x-ray 11/10/2013.  , 08/21/2013, 09/23/2011.  FINDINGS: Mediastinum and hilar structures stable. Stable cardiomegaly. Stable severe diffuse pulmonary chronic interstitial lung disease. Pleural parenchymal thickening noted consistent with scarring. Cardiac pacer with lead tip over the right ventricle. No acute bony abnormality.  IMPRESSION: 1. Stable changes of severe chronic interstitial lung disease. 2. Stable cardiomegaly.  Cardiac pacer.   Electronically Signed   By: Marcello Moores  Register   On: 11/25/2013 16:48   UA: Negative for infection or blood  Janora Norlander, DO 11/29/2013, 6:49 AM PGY-1, No Name Intern pager: [phone], text pages welcome

## 2013-11-29 NOTE — Discharge Instructions (Addendum)
Rachel [name], we are so happy to see that you are feeling better!  You were admitted with hypoxia (low oxygen) secondary to your chronic lung disease.  Please make sure to follow up with Dr Lindell Noe this Friday.  Also, please make note of your dose change.  You will be taking Lasix (furosemide) 60m in the morning and 277min the evening.  You will also be discharged with a 2 week supply of Prednisone.  Please make sure to follow up with Dr ByLamonte Sakaipulmonology) this week and discuss potential for continued use of this medication long term.  An appointment has been scheduled for you.  [facility] Acute Respiratory Failure Respiratory failure is when your lungs are not working well and your breathing (respiratory) system fails. When respiratory failure occurs, it is difficult for your lungs to get enough oxygen, get rid of carbon dioxide, or both. Respiratory failure can be life threatening.  Respiratory failure can be acute or chronic. Acute respiratory failure is sudden, severe, and requires emergency medical treatment. Chronic respiratory failure is less severe, happens over time, and requires ongoing treatment.  WHAT ARE THE CAUSES OF ACUTE RESPIRATORY FAILURE?  Any problem affecting the heart or lungs can cause acute respiratory failure. Some of these causes include the following:  Chronic bronchitis and emphysema (COPD).   Blood clot going to a lung (pulmonary embolism).   Having water in the lungs caused by heart failure, lung injury, or infection (pulmonary edema).   Collapsed lung (pneumothorax).   Pneumonia.   Pulmonary fibrosis.   Obesity.   Asthma.   Heart failure.   Any type of trauma to the chest that can make breathing difficult.   Nerve or muscle diseases making chest movements difficult. WHAT SYMPTOMS SHOULD YOU WATCH FOR?  If you have any of these signs or symptoms, you should seek immediate medical care:   You have  shortness of breath (dyspnea) with or without activity.   You have rapid, fast breathing (tachypnea).   You are wheezing.  You are unable to say more than a few words without having to catch your breath.  You find it very difficult to function normally.  You have a fast heart rate.   You have a bluish color to your finger or toe nail beds.   You have confusion or drowsiness or both.  HOW WILL MY ACUTE RESPIRATORY FAILURE BE TREATED?  Treatment of acute respiratory failure depends on the cause of the respiratory failure. Usually, you will stay in the intensive care unit so your breathing can be watched closely. Treatment can include the following:  Oxygen. Oxygen can be delivered through the following:  Nasal cannula. This is small tubing that goes in your nose to give you oxygen.  Face mask. A face mask covers your nose and mouth to give you oxygen.  Medicine. Different medicines can be given to help with breathing. These can include:  Nebulizers. Nebulizers deliver medicines to open the air passages (bronchodilators). These medicines help to open or relax the airways in the lungs so you can breathe better. They can also help loosen mucus from your lungs.  Diuretics. Diuretic medicines can help you breathe better by getting rid of extra water in your body.  Steroids. Steroid medicines can help decrease swelling (inflammation) in your lungs.  Antibiotics.  Chest tube. If you have a collapsed lung (pneumothorax), a chest tube is placed to help reinflate the lung.  Non-invasive positive pressure ventilation (NPPV). This is a tight-fitting mask that goes over your nose and mouth. The mask has tubing that is attached to a machine. The machine blows air into the tubing, which helps to keep the tiny air sacs (alveoli) in your lungs open. This machine allows you to breathe on your own.  Ventilator. A ventilator is a breathing machine. When on a ventilator, a breathing tube is put  into the lungs. A ventilator is used when you can no longer breathe well enough on your own. You may have low oxygen levels or high carbon dioxide (CO2) levels in your blood. When you are on a ventilator, sedation and pain medicines are given to make you sleep so your lungs can heal. Document Released: 03/30/2013 Document Revised: 08/09/2013 Document Reviewed: 03/30/2013 [facility] Patient Information 2015 [place], [place]. This information is not intended to replace advice given to you by your health care provider. Make sure you discuss any questions you have with your health care provider.

## 2013-11-29 NOTE — Progress Notes (Signed)
I discussed with  Dr Lajuana Ripple.  I agree with their plans documented in their progress note.

## 2013-11-29 NOTE — Evaluation (Signed)
Occupational Therapy Evaluation Patient Details Name: Rachel [name] MRN: [id number] DOB: [birth year] Today's Date: 11/29/2013    History of Present Illness Adm 11/25/13 for dyspnea and cough. Has had multiple hospitalizations for same. PMHx-pulmonary sarcoidoisis, CHF, pulmonary HTN   Clinical Impression   Patient evaluated by Occupational Therapy with no further acute OT needs identified. All education has been completed and the patient has no further questions. Pt is quickly approaching her baseline level of functioning.  02 sats remained 95-97% on 6L.  She is able to verbalize how she incorporates energy conservation techniques into ADLs.  SHe has an HEP she performs at home.  See below for any follow-up Occupational Therapy or equipment needs. OT is signing off. Thank you for this referral.      Follow Up Recommendations  No OT follow up;Supervision - Intermittent    Equipment Recommendations  None recommended by OT    Recommendations for Other Services       Precautions / Restrictions Precautions Precautions: None      Mobility Bed Mobility                  Transfers Overall transfer level: Modified independent Equipment used: None                  Balance Overall balance assessment: No apparent balance deficits (not formally assessed)                                          ADL Overall ADL's : Needs assistance/impaired Eating/Feeding: Independent;Sitting   Grooming: Wash/dry hands;Wash/dry face;Oral care;Brushing hair;Applying deodorant;Supervision/safety;Standing   Upper Body Bathing: Set up;Sitting   Lower Body Bathing: Supervison/ safety;Sit to/from stand   Upper Body Dressing : Set up;Sitting   Lower Body Dressing: Supervision/safety;Sit to/from stand   Toilet Transfer: Supervision/safety;Ambulation;Comfort height toilet   Toileting- Clothing Manipulation and Hygiene: Supervision/safety;Sit to/from stand        Functional mobility during ADLs: Min guard General ADL Comments: Pt progressing back close to her baseline.  she demonstrates good safety awareness and verbalizes understanding of energy conservation techniques     Vision                     Perception     Praxis      Pertinent Vitals/Pain Pain Assessment: Faces Faces Pain Scale: Hurts whole lot Pain Location: Chest Rt side - transient pain.  RN notified.  Pain 0/10 at end of session Pain Descriptors / Indicators: Sharp     Hand Dominance Right   Extremity/Trunk Assessment Upper Extremity Assessment Upper Extremity Assessment: Overall WFL for tasks assessed   Lower Extremity Assessment Lower Extremity Assessment: Defer to PT evaluation   Cervical / Trunk Assessment Cervical / Trunk Assessment: Normal   Communication Communication Communication: No difficulties   Cognition Arousal/Alertness: Awake/alert Behavior During Therapy: WFL for tasks assessed/performed Overall Cognitive Status: Within Functional Limits for tasks assessed                     General Comments       Exercises       Shoulder Instructions      Home Living Family/patient expects to be discharged to:: Private residence Living Arrangements: Children Available Help at Discharge: Family;Available PRN/intermittently Type of Home: Apartment Home Access: Level entry     Home Layout: One level  Bathroom Shower/Tub: Tub/shower unit;Curtain Shower/tub characteristics: Engineer, building services: Standard     Home Equipment: Cane - single point;Walker - 4 wheels   Additional Comments: Pt takes tub baths      Prior Functioning/Environment Level of Independence: Independent with assistive device(s)        Comments: used cane when legs are weak.  Pt sits to cook and utilizes energy conservation technqiues consistently during ADLs    OT Diagnosis:     OT Problem List:     OT Treatment/Interventions:      OT  Goals(Current goals can be found in the care plan section)    OT Frequency:     Barriers to D/C:            Co-evaluation              End of Session Nurse Communication: Mobility status;Other (comment) (chest pain )  Activity Tolerance: Patient tolerated treatment well Patient left: in chair;with call bell/phone within reach   Time: 1315-1340 OT Time Calculation (min): 25 min Charges:  OT General Charges $OT Visit: 1 Procedure OT Evaluation $Initial OT Evaluation Tier I: 1 Procedure OT Treatments $Therapeutic Activity: 8-22 mins G-Codes:    Rachel [name] December 23, 2013, 1:47 PM

## 2013-11-30 ENCOUNTER — Telehealth: Payer: Self-pay | Admitting: Cardiology

## 2013-11-30 LAB — URINE CULTURE
COLONY COUNT: NO GROWTH
Culture: NO GROWTH

## 2013-11-30 NOTE — Telephone Encounter (Signed)
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  They will be re faxed today as well .   Please give her a call when done or any questions.    Thanks!

## 2013-11-30 NOTE — Discharge Summary (Signed)
I discussed with  Dr Lajuana Ripple.  I agree with their plans documented in their progress note.

## 2013-12-01 LAB — CULTURE, BLOOD (ROUTINE X 2)
Culture: NO GROWTH
Culture: NO GROWTH

## 2013-12-01 NOTE — Telephone Encounter (Signed)
Orders faxed today, left mess

## 2013-12-02 ENCOUNTER — Ambulatory Visit: Payer: Self-pay | Admitting: Emergency Medicine

## 2013-12-03 ENCOUNTER — Encounter: Payer: Self-pay | Admitting: Emergency Medicine

## 2013-12-03 ENCOUNTER — Ambulatory Visit (INDEPENDENT_AMBULATORY_CARE_PROVIDER_SITE_OTHER): Payer: Medicare Other | Admitting: Emergency Medicine

## 2013-12-03 ENCOUNTER — Inpatient Hospital Stay: Payer: Self-pay | Admitting: Family Medicine

## 2013-12-03 VITALS — BP 120/70 | HR 78 | Ht 63.0 in | Wt 164.0 lb

## 2013-12-03 DIAGNOSIS — D869 Sarcoidosis, unspecified: Secondary | ICD-10-CM

## 2013-12-03 DIAGNOSIS — I2789 Other specified pulmonary heart diseases: Secondary | ICD-10-CM

## 2013-12-03 NOTE — Progress Notes (Signed)
Subjective:    Patient ID: Rachel [name], female    DOB: 03-26-1961, 53 y.o.   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 She is now on Revatio 80 mg tid and and inhaled Iloprost (added in 2011), managed by Dr Aundra Dubin at [facility]. 6 min walk (7/12) 248 m. RHC (8/12): mean RA 2, PA 51/24, mean PA 35, mean PCWP 6, CI 3, PVR 5.1 WU. She believes that she is doing very well. She wears O2 at 6L/min at all times. She believes she knows when her sarcoidosis is active, remains on flovent.   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 She is now on Revatio 80 mg tid and and inhaled Iloprost (added in 2011). Hasn't has 6 min walk yet. Stopped flovent last time to see if she would miss it.  She is having a bit more exertional SOB w chores, thinks this may be due in part to deconditioning.   CXR today >> improved LLL atx., scattered B infiltrates.   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 She is now on Revatio 80 mg tid and and inhaled Iloprost (added in 2011).  Lately she has been having some increased DOE, some exertional CP. Last 6 minute walk >> never had it yet. Planning to do next month after her ankle is evaluated due to pain when walking (prior fracture).   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 She is now on Revatio 80 mg tid and and inhaled Iloprost.  She has been having nasal congestion and overall more dyspnea for the last . I recommended that she start [place] chlorpheniramine. The pharmacist was worried about this, so instead she got afrin x 3 days.    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 She is now on Revatio 80 mg tid and and inhaled  Ventavis. She was looking into change to Tyvaso, but apparently insurance wouldn't cover it?? Not clear why this was refused.   She was having congestion last time > took levaquin and started Ayr spray, improved. Her breathing is at baseline, but she remains quite limited. She can tell when her volume status is up. Planning for TTE in 8/14. 6 minute walk was performed in April, I don't have the data available. She c/o some frequent bruising.   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 Followed by Dr MAundra Dubin> current PAH meds are Revatio 80 tid + Ventavis. Most recent echocardiogram in 01/07/13 showed estimated PASP 44 mmHg (stable), but decreased EF. Cardiac MRI was discussed to look for infiltrative sarcoidosis, but this has not been done - note she has pacer wires in place. She has some bad days, DOE especially. She uses ProAir with good effect.   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 Followed by Dr MAundra Dubin> current PAH meds are Revatio 80 tid + Ventavis. Most recent echocardiogram in 01/07/13 showed estimated PASP 44 mmHg (stable), decreased EF.  Last time we did a trial of Symbicort > she feels that it has helped some, she is less SOB w exertion, able to do more without  pacing herself. It has made her face and neck more swollen/puffy, she feels that she is eating more.   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 She is on an aggressive PAH regimen. PFT support mixed disease. She benefited from Symbicort but insurance has rejected and says she needs to fail Advair. She has the Advair but hasn't tried it yet.   08/17/13  Acute OV    Complains of prod cough with yellow mucus, increased DOE, wheezing, runny/stuffy nose, HA, PND x3days.  Denies tightness, f/c/s, hemoptysis, nausea, vomiting.  Patient denies any chest pain, rash, joint  swelling. Complains of a postnasal drip, and drainage. No recent travel. Patient was recently admitted last month for possible fluid overload . Symptoms improved with diuresis. She was also given empiric Levaquin for possible underlying pneumonia chest x-ray showed diffuse pulmonary fibrosis, with no acute changes noted  ROV 08/26/13 -- follow up visit for stage 4 sarcoid and PAH, has been on ventavis and sildenifil 80 tid. Her family tells me today that her ventavis has been decreased; Dr Claris Gladden notes do not say this but instead that they are trying to change to Tyvaso (4x a day). The pt has been confused, agitated and was taken to ED this week. They found that she had a UTI which has been treated. The family is concerned that she is not safe to be alone right now. She is scheduled to see a psychologist.    Rachel [name] 12/03/13 -- stage 4 sarcoidosis, secondary PAH. She was  Just hospitalized x 2 for dyspnea, possible PNA, but largely volume overload. She is now on Tyvaso + sildenifil 80.  She is now improved s/p diuresis. She is having HA, possibly in association w her tyvaso.  She is on symbicort bid. C/o PND today.    Objective:   Physical Exam Filed Vitals:   12/03/13 1159  BP: 120/70  Pulse: 78  Height: _0  (1.6 m)  Weight: 164 lb (74.39 kg)  SpO2: 92%    Gen: Pleasant, overwt, in no distress on O2,  normal affect  ENT: No lesions,  mouth clear,  oropharynx clear, no postnasal drip  Neck: No JVD, no TMG, no carotid bruits  Lungs: No use of accessory muscles, few B insp crackles, no wheezing   Cardiovascular: RRR, no m/r/g   Musculoskeletal: No deformities, no cyanosis or clubbing  Neuro: alert, no agitation or confusion noted today.   Skin: Warm, no lesions or rashes   07/28/13 --   Study Conclusions  - Left ventricle: The cavity size was normal. Wall thickness was increased in a pattern of mild LVH. The estimated ejection fraction was 60%. Wall motion was normal;  there were no regional wall motion abnormalities. Doppler parameters are consistent with abnormal left ventricular relaxation (grade 1 diastolic dysfunction). - Mitral valve: Flat closure of mitral valve. Trivial regurgitation. - Right ventricle: The cavity size was normal. Pacer wire or catheter noted in right ventricle. Systolic function was normal. - Right atrium: The atrium was mildly dilated    Assessment & Plan:  PULMONARY HYPERTENSION Please continue your Revatio and Tyvaso as you are taking them  Continue your fluid medications as directed by Dr Wilma Flavin your oxygen at all times.  Follow with Dr Lamonte Sakai in 3 months or sooner if you have any problem  PULMONARY SARCOIDOSIS - continue symbicort - continue baseline pred 10

## 2013-12-03 NOTE — Assessment & Plan Note (Signed)
-  continue symbicort - continue baseline pred 10

## 2013-12-03 NOTE — Assessment & Plan Note (Signed)
Please continue your Revatio and Tyvaso as you are taking them  Continue your fluid medications as directed by Dr Wilma Flavin your oxygen at all times.  Follow with Dr Lamonte Sakai in 3 months or sooner if you have any problem

## 2013-12-03 NOTE — Patient Instructions (Signed)
Please continue your Revatio and Tyvaso as you are taking them  Continue your fluid medications as directed by Dr Wilma Flavin your oxygen at all times.  Please try starting OTC chlorpheniramine 45m as directed for allergy and drainage.  Follow with Dr BLamonte Sakaiin 3 months or sooner if you have any problems.

## 2013-12-06 ENCOUNTER — Telehealth: Payer: Self-pay | Admitting: Family Medicine

## 2013-12-06 DIAGNOSIS — D869 Sarcoidosis, unspecified: Secondary | ICD-10-CM

## 2013-12-06 NOTE — Telephone Encounter (Signed)
Pt called and would like a refill on her Potassium called in. jw

## 2013-12-07 MED ORDER — ALBUTEROL SULFATE HFA 108 (90 BASE) MCG/ACT IN AERS: 2.0000 | INHALATION_SPRAY | Freq: Four times a day (QID) | RESPIRATORY_TRACT | Status: AC

## 2013-12-07 MED ORDER — POTASSIUM CHLORIDE CRYS ER 20 MEQ PO TBCR: 20.0000 meq | EXTENDED_RELEASE_TABLET | Freq: Two times a day (BID) | ORAL | Status: DC

## 2013-12-07 NOTE — Telephone Encounter (Signed)
Both Rx's sent in [place], [place]). Thanks. --CMS

## 2013-12-07 NOTE — Telephone Encounter (Signed)
Pt called and wanted to check the status of her refill request on Potassium and also she needs a refill on her ProAir. jw

## 2013-12-08 ENCOUNTER — Encounter: Payer: Self-pay | Admitting: Family Medicine

## 2013-12-08 NOTE — Progress Notes (Signed)
Rachel [name] from [facility] called and said that she has sent a plan of care paper to be signed by a [place] certified physician and that she has not received this back.  She says she has sent it several times.   Please call her at [phone] ext [phone]

## 2013-12-10 ENCOUNTER — Telehealth (HOSPITAL_COMMUNITY): Payer: Self-pay | Admitting: Vascular Surgery

## 2013-12-10 NOTE — Telephone Encounter (Signed)
Frankie with [facility] RN returned call stating patient's weight actually up 10 lbs this week,. 3 lbs overnight.  Advised to increase patient's lasix to 28m twice daily throughout the weekend.  Will [facility] next week [facility] BRenee Pain

## 2013-12-10 NOTE — Telephone Encounter (Signed)
Nurse called again .Marland Kitchen Concerned about pt weight gain.Marland Kitchen Please advise [phone] ext [phone]

## 2013-12-10 NOTE — Telephone Encounter (Signed)
Nurse left message concerning  pt weight .Marland Kitchen She would like a call back ASAP.Marland Kitchen PLEASE ADVISE

## 2013-12-10 NOTE — Progress Notes (Signed)
Attempted to call the number back, with no answer and no voice mail. I have received no forms, but I was told by the person with whom I left verbal orders recently to expect a fax. I will make sure any forms are completed if / when they are received and will wait for [facility] to call again, as I wasn't able to get in touch with Darilyn at that number. Thanks. --CMS

## 2013-12-10 NOTE — Telephone Encounter (Signed)
Unable to reach [facility] RN that called earlier.  Called patient directly who states "My weight is stable at 160lb and i feel fine".  Unsure why RN left [place] a message concerned about her weight.  Has no complaints. Renee Pain

## 2013-12-17 ENCOUNTER — Telehealth (HOSPITAL_COMMUNITY): Payer: Self-pay | Admitting: Vascular Surgery

## 2013-12-17 NOTE — Telephone Encounter (Signed)
Spoke w/Rachel [name] gave ok for pt to have labs next week, she states pt has been doing pretty well, her wt fluctuates up and down, will call pt and sch her f/u with Dr Aundra Dubin for new couple of weeks

## 2013-12-17 NOTE — Telephone Encounter (Signed)
Ext [phone] .Marland Kitchen Pt has a 4.6 weight gain 2 days

## 2013-12-17 NOTE — Telephone Encounter (Addendum)
Spoke w/pt she state her wt usual runs 156-161 today she is 160 today, SOB was worse this AM but since taking her lasix she feels it has improved and she is just resting today, advised it SOB this afternoon can take an extra 20 mg of lasix she is agreeable f/u appt sch for 9/30 advised if wt not decreasing and breathing not improving to call back before then

## 2013-12-17 NOTE — Telephone Encounter (Signed)
[facility] called pt is due today for a BMP .Marland Kitchen PT told nurse she is a hard stick pt is going to her PCP next week and the nurse wanted to know if that ok to wait to get labs then... Please advise

## 2013-12-20 ENCOUNTER — Emergency Department (HOSPITAL_COMMUNITY): Payer: Medicare Other

## 2013-12-20 ENCOUNTER — Encounter (HOSPITAL_COMMUNITY): Payer: Self-pay | Admitting: Emergency Medicine

## 2013-12-20 ENCOUNTER — Inpatient Hospital Stay (HOSPITAL_COMMUNITY)
Admission: EM | Admit: 2013-12-20 | Discharge: 2013-12-25 | DRG: 193 | Disposition: A | Discharge status: Home Health | Payer: Medicare Other | Attending: Family Medicine | Admitting: Family Medicine

## 2013-12-20 ENCOUNTER — Telehealth (HOSPITAL_COMMUNITY): Payer: Self-pay | Admitting: Vascular Surgery

## 2013-12-20 DIAGNOSIS — R634 Abnormal weight loss: Secondary | ICD-10-CM

## 2013-12-20 DIAGNOSIS — F411 Generalized anxiety disorder: Secondary | ICD-10-CM | POA: Diagnosis present

## 2013-12-20 DIAGNOSIS — I272 Pulmonary hypertension, unspecified: Secondary | ICD-10-CM

## 2013-12-20 DIAGNOSIS — I5023 Acute on chronic systolic (congestive) heart failure: Secondary | ICD-10-CM | POA: Diagnosis not present

## 2013-12-20 DIAGNOSIS — R0602 Shortness of breath: Secondary | ICD-10-CM

## 2013-12-20 DIAGNOSIS — Z95810 Presence of automatic (implantable) cardiac defibrillator: Secondary | ICD-10-CM

## 2013-12-20 DIAGNOSIS — I428 Other cardiomyopathies: Secondary | ICD-10-CM | POA: Diagnosis present

## 2013-12-20 DIAGNOSIS — E872 Acidosis, unspecified: Secondary | ICD-10-CM | POA: Diagnosis present

## 2013-12-20 DIAGNOSIS — M940 Chondrocostal junction syndrome [Tietze]: Secondary | ICD-10-CM | POA: Diagnosis present

## 2013-12-20 DIAGNOSIS — J99 Respiratory disorders in diseases classified elsewhere: Secondary | ICD-10-CM | POA: Diagnosis present

## 2013-12-20 DIAGNOSIS — I10 Essential (primary) hypertension: Secondary | ICD-10-CM

## 2013-12-20 DIAGNOSIS — Z9981 Dependence on supplemental oxygen: Secondary | ICD-10-CM

## 2013-12-20 DIAGNOSIS — D638 Anemia in other chronic diseases classified elsewhere: Secondary | ICD-10-CM | POA: Diagnosis present

## 2013-12-20 DIAGNOSIS — N390 Urinary tract infection, site not specified: Secondary | ICD-10-CM | POA: Diagnosis not present

## 2013-12-20 DIAGNOSIS — F323 Major depressive disorder, single episode, severe with psychotic features: Secondary | ICD-10-CM | POA: Diagnosis present

## 2013-12-20 DIAGNOSIS — Z7982 Long term (current) use of aspirin: Secondary | ICD-10-CM | POA: Diagnosis not present

## 2013-12-20 DIAGNOSIS — K219 Gastro-esophageal reflux disease without esophagitis: Secondary | ICD-10-CM | POA: Diagnosis present

## 2013-12-20 DIAGNOSIS — I509 Heart failure, unspecified: Secondary | ICD-10-CM | POA: Diagnosis present

## 2013-12-20 DIAGNOSIS — J9620 Acute and chronic respiratory failure, unspecified whether with hypoxia or hypercapnia: Secondary | ICD-10-CM | POA: Diagnosis present

## 2013-12-20 DIAGNOSIS — J189 Pneumonia, unspecified organism: Principal | ICD-10-CM | POA: Diagnosis present

## 2013-12-20 DIAGNOSIS — Z87891 Personal history of nicotine dependence: Secondary | ICD-10-CM | POA: Diagnosis not present

## 2013-12-20 DIAGNOSIS — IMO0002 Reserved for concepts with insufficient information to code with codable children: Secondary | ICD-10-CM | POA: Diagnosis not present

## 2013-12-20 DIAGNOSIS — D869 Sarcoidosis, unspecified: Secondary | ICD-10-CM | POA: Diagnosis not present

## 2013-12-20 DIAGNOSIS — R0600 Dyspnea, unspecified: Secondary | ICD-10-CM

## 2013-12-20 DIAGNOSIS — I2789 Other specified pulmonary heart diseases: Secondary | ICD-10-CM

## 2013-12-20 DIAGNOSIS — J9621 Acute and chronic respiratory failure with hypoxia: Secondary | ICD-10-CM

## 2013-12-20 DIAGNOSIS — R0902 Hypoxemia: Secondary | ICD-10-CM

## 2013-12-20 LAB — PRO B NATRIURETIC PEPTIDE: Pro B Natriuretic peptide (BNP): 1779 pg/mL — ABNORMAL HIGH (ref 0–125)

## 2013-12-20 LAB — I-STAT TROPONIN, ED: Troponin i, poc: 0 ng/mL (ref 0.00–0.08)

## 2013-12-20 LAB — I-STAT ARTERIAL BLOOD GAS, ED
ACID-BASE EXCESS: 6 mmol/L — AB (ref 0.0–2.0)
Bicarbonate: 31.6 mEq/L — ABNORMAL HIGH (ref 20.0–24.0)
O2 SAT: 92 %
PCO2 ART: 51.5 mmHg — AB (ref 35.0–45.0)
Patient temperature: 97.8
TCO2: 33 mmol/L (ref 0–100)
pH, Arterial: 7.394 (ref 7.350–7.450)
pO2, Arterial: 65 mmHg — ABNORMAL LOW (ref 80.0–100.0)

## 2013-12-20 LAB — CBC WITH DIFFERENTIAL/PLATELET
BASOS ABS: 0.1 10*3/uL (ref 0.0–0.1)
Basophils Relative: 0 % (ref 0–1)
EOS ABS: 0.3 10*3/uL (ref 0.0–0.7)
EOS PCT: 3 % (ref 0–5)
HCT: 34.1 % — ABNORMAL LOW (ref 36.0–46.0)
Hemoglobin: 10.7 g/dL — ABNORMAL LOW (ref 12.0–15.0)
LYMPHS ABS: 1.8 10*3/uL (ref 0.7–4.0)
Lymphocytes Relative: 16 % (ref 12–46)
MCH: 28.2 pg (ref 26.0–34.0)
MCHC: 31.4 g/dL (ref 30.0–36.0)
MCV: 89.7 fL (ref 78.0–100.0)
Monocytes Absolute: 0.7 10*3/uL (ref 0.1–1.0)
Monocytes Relative: 6 % (ref 3–12)
NEUTROS PCT: 75 % (ref 43–77)
Neutro Abs: 8.5 10*3/uL — ABNORMAL HIGH (ref 1.7–7.7)
PLATELETS: 242 10*3/uL (ref 150–400)
RBC: 3.8 MIL/uL — AB (ref 3.87–5.11)
RDW: 14.3 % (ref 11.5–15.5)
WBC: 11.3 10*3/uL — ABNORMAL HIGH (ref 4.0–10.5)

## 2013-12-20 LAB — MRSA PCR SCREENING: MRSA by PCR: NEGATIVE

## 2013-12-20 LAB — BASIC METABOLIC PANEL
Anion gap: 13 (ref 5–15)
BUN: 14 mg/dL (ref 6–23)
CALCIUM: 9.5 mg/dL (ref 8.4–10.5)
CO2: 29 mEq/L (ref 19–32)
Chloride: 101 mEq/L (ref 96–112)
Creatinine, Ser: 0.71 mg/dL (ref 0.50–1.10)
GFR calc non Af Amer: >90 mL/min (ref >90)
GLUCOSE: 93 mg/dL (ref 70–99)
Potassium: 4 mEq/L (ref 3.7–5.3)
Sodium: 143 mEq/L (ref 137–147)

## 2013-12-20 LAB — TROPONIN I: Troponin I: <0.3 ng/mL (ref <0.30)

## 2013-12-20 MED ORDER — SODIUM CHLORIDE 0.9 % IJ SOLN: 3.0000 mL | INTRAMUSCULAR | Status: DC | PRN

## 2013-12-20 MED ORDER — PANTOPRAZOLE SODIUM 40 MG PO TBEC
40.0000 mg | DELAYED_RELEASE_TABLET | Freq: Every day | ORAL | Status: DC
  Administered 2013-12-20 – 2013-12-25 (×6): 40 mg via ORAL
  Filled 2013-12-20 (×6): qty 1

## 2013-12-20 MED ORDER — CEFEPIME HCL 1 G IJ SOLR
1.0000 g | Freq: Three times a day (TID) | INTRAMUSCULAR | Status: DC
  Administered 2013-12-20 – 2013-12-24 (×11): 1 g via INTRAVENOUS
  Filled 2013-12-20 (×14): qty 1

## 2013-12-20 MED ORDER — IPRATROPIUM-ALBUTEROL 0.5-2.5 (3) MG/3ML IN SOLN
3.0000 mL | Freq: Once | RESPIRATORY_TRACT | Status: AC
  Administered 2013-12-20: 3 mL via RESPIRATORY_TRACT
  Filled 2013-12-20: qty 3

## 2013-12-20 MED ORDER — ONDANSETRON HCL 4 MG/2ML IJ SOLN
4.0000 mg | Freq: Four times a day (QID) | INTRAMUSCULAR | Status: DC | PRN
Start: 2013-12-20 — End: 2013-12-25
  Administered 2013-12-21 – 2013-12-23 (×2): 4 mg via INTRAVENOUS
  Filled 2013-12-20 (×2): qty 2

## 2013-12-20 MED ORDER — TREPROSTINIL 0.6 MG/ML IN SOLN
18.0000 ug | Freq: Four times a day (QID) | RESPIRATORY_TRACT | Status: DC
  Filled 2013-12-20 (×34): qty 0.03

## 2013-12-20 MED ORDER — METOPROLOL TARTRATE 25 MG PO TABS
25.0000 mg | ORAL_TABLET | Freq: Two times a day (BID) | ORAL | Status: DC
  Administered 2013-12-20 – 2013-12-23 (×5): 25 mg via ORAL
  Filled 2013-12-20 (×8): qty 1

## 2013-12-20 MED ORDER — IOHEXOL 350 MG/ML SOLN
70.0000 mL | Freq: Once | INTRAVENOUS | Status: AC | PRN
  Administered 2013-12-20: 70 mL via INTRAVENOUS

## 2013-12-20 MED ORDER — FUROSEMIDE 10 MG/ML IJ SOLN
40.0000 mg | Freq: Every day | INTRAMUSCULAR | Status: DC
  Administered 2013-12-20: 40 mg via INTRAVENOUS
  Filled 2013-12-20 (×2): qty 4

## 2013-12-20 MED ORDER — ALBUTEROL SULFATE (2.5 MG/3ML) 0.083% IN NEBU: 5.0000 mg | INHALATION_SOLUTION | RESPIRATORY_TRACT | Status: DC | PRN

## 2013-12-20 MED ORDER — ACETAMINOPHEN 650 MG RE SUPP: 650.0000 mg | Freq: Four times a day (QID) | RECTAL | Status: DC | PRN

## 2013-12-20 MED ORDER — PREDNISONE 20 MG PO TABS
20.0000 mg | ORAL_TABLET | Freq: Every day | ORAL | Status: DC
  Administered 2013-12-21 – 2013-12-25 (×5): 20 mg via ORAL
  Filled 2013-12-20 (×7): qty 1

## 2013-12-20 MED ORDER — ACETAMINOPHEN 325 MG PO TABS
650.0000 mg | ORAL_TABLET | Freq: Four times a day (QID) | ORAL | Status: DC | PRN
Start: 2013-12-20 — End: 2013-12-25
  Administered 2013-12-21 – 2013-12-25 (×4): 650 mg via ORAL
  Filled 2013-12-20 (×4): qty 2

## 2013-12-20 MED ORDER — SODIUM CHLORIDE 0.9 % IJ SOLN
3.0000 mL | Freq: Two times a day (BID) | INTRAMUSCULAR | Status: DC
  Administered 2013-12-23 – 2013-12-24 (×2): 3 mL via INTRAVENOUS

## 2013-12-20 MED ORDER — CETYLPYRIDINIUM CHLORIDE 0.05 % MT LIQD
7.0000 mL | Freq: Two times a day (BID) | OROMUCOSAL | Status: DC
  Administered 2013-12-20 – 2013-12-25 (×10): 7 mL via OROMUCOSAL

## 2013-12-20 MED ORDER — ENOXAPARIN SODIUM 40 MG/0.4ML ~~LOC~~ SOLN
40.0000 mg | SUBCUTANEOUS | Status: DC
  Administered 2013-12-20 – 2013-12-24 (×5): 40 mg via SUBCUTANEOUS
  Filled 2013-12-20 (×6): qty 0.4

## 2013-12-20 MED ORDER — DOCUSATE SODIUM 100 MG PO CAPS
100.0000 mg | ORAL_CAPSULE | Freq: Two times a day (BID) | ORAL | Status: DC | PRN
  Filled 2013-12-20: qty 1

## 2013-12-20 MED ORDER — VANCOMYCIN HCL IN DEXTROSE 1-5 GM/200ML-% IV SOLN
1000.0000 mg | Freq: Three times a day (TID) | INTRAVENOUS | Status: DC
  Administered 2013-12-20 – 2013-12-24 (×11): 1000 mg via INTRAVENOUS
  Filled 2013-12-20 (×12): qty 200

## 2013-12-20 MED ORDER — PAROXETINE HCL 20 MG PO TABS
20.0000 mg | ORAL_TABLET | Freq: Every day | ORAL | Status: DC
  Administered 2013-12-21 – 2013-12-25 (×5): 20 mg via ORAL
  Filled 2013-12-20 (×6): qty 1

## 2013-12-20 MED ORDER — TREPROSTINIL 0.6 MG/ML IN SOLN
18.0000 ug | Freq: Four times a day (QID) | RESPIRATORY_TRACT | Status: DC
  Administered 2013-12-20 – 2013-12-25 (×20): 18 ug via RESPIRATORY_TRACT
  Filled 2013-12-20 (×33): qty 0.03

## 2013-12-20 MED ORDER — SODIUM CHLORIDE 0.9 % IJ SOLN
3.0000 mL | Freq: Two times a day (BID) | INTRAMUSCULAR | Status: DC
  Administered 2013-12-20: 3 mL via INTRAVENOUS
  Administered 2013-12-21: 09:00:00 via INTRAVENOUS
  Administered 2013-12-21 – 2013-12-24 (×6): 3 mL via INTRAVENOUS

## 2013-12-20 MED ORDER — ONDANSETRON HCL 4 MG PO TABS: 4.0000 mg | ORAL_TABLET | Freq: Four times a day (QID) | ORAL | Status: DC | PRN

## 2013-12-20 MED ORDER — METHYLPREDNISOLONE SODIUM SUCC 125 MG IJ SOLR
125.0000 mg | Freq: Once | INTRAMUSCULAR | Status: AC
  Administered 2013-12-20: 125 mg via INTRAVENOUS
  Filled 2013-12-20: qty 2

## 2013-12-20 MED ORDER — DEXTROSE 5 % IV SOLN
1.0000 g | INTRAVENOUS | Status: AC
  Administered 2013-12-20: 1 g via INTRAVENOUS
  Filled 2013-12-20: qty 1

## 2013-12-20 MED ORDER — ARIPIPRAZOLE 2 MG PO TABS
2.0000 mg | ORAL_TABLET | ORAL | Status: DC
  Administered 2013-12-20 – 2013-12-24 (×3): 2 mg via ORAL
  Filled 2013-12-20 (×3): qty 1

## 2013-12-20 MED ORDER — IPRATROPIUM-ALBUTEROL 0.5-2.5 (3) MG/3ML IN SOLN
3.0000 mL | RESPIRATORY_TRACT | Status: DC
  Administered 2013-12-20 – 2013-12-24 (×25): 3 mL via RESPIRATORY_TRACT
  Filled 2013-12-20 (×26): qty 3

## 2013-12-20 MED ORDER — BUDESONIDE-FORMOTEROL FUMARATE 160-4.5 MCG/ACT IN AERO
1.0000 | INHALATION_SPRAY | Freq: Two times a day (BID) | RESPIRATORY_TRACT | Status: DC
  Administered 2013-12-20 – 2013-12-25 (×10): 1 via RESPIRATORY_TRACT
  Filled 2013-12-20: qty 6

## 2013-12-20 MED ORDER — ASPIRIN EC 81 MG PO TBEC
81.0000 mg | DELAYED_RELEASE_TABLET | Freq: Every day | ORAL | Status: DC
  Administered 2013-12-20 – 2013-12-25 (×6): 81 mg via ORAL
  Filled 2013-12-20 (×7): qty 1

## 2013-12-20 MED ORDER — VANCOMYCIN HCL IN DEXTROSE 1-5 GM/200ML-% IV SOLN
1000.0000 mg | INTRAVENOUS | Status: AC
  Administered 2013-12-20: 1000 mg via INTRAVENOUS
  Filled 2013-12-20: qty 200

## 2013-12-20 MED ORDER — SILDENAFIL CITRATE 20 MG PO TABS
80.0000 mg | ORAL_TABLET | Freq: Three times a day (TID) | ORAL | Status: DC
  Administered 2013-12-20 – 2013-12-25 (×15): 80 mg via ORAL
  Filled 2013-12-20 (×20): qty 4

## 2013-12-20 NOTE — ED Notes (Addendum)
Pt here vis EMS with c/o SOB x 3 days with productive cough. Pt also c/o dark urine and diarrhea x 3 days. Pt states that her PCP increased her lasix from 21m to 437mon Friday, but has had decreased urine since then. Pt has hx of CHF. Pt denies n/v, or CP. Pt has potential allergy to solumedrol. Pt reports self-checking her o2saturation at home and that it has been between 60-80 for the last 3 days.

## 2013-12-20 NOTE — Progress Notes (Signed)
ANTIBIOTIC CONSULT NOTE - INITIAL  Pharmacy Consult for Vancomycin and Cefepjime Indication: possible HCAP  Allergies  Allergen Reactions  . Latex Rash  . Nitroglycerin Other (See Comments)    Patient is on Revatio  . Other Other (See Comments)    High Dose Steroids cause chemical imbalance and altered mental status  . Meperidine Hcl Other (See Comments)    REACTION: Makes her feel funny  . Tramadol Hcl Other (See Comments)    REACTION: nausea, lightheadedness, sleepiness, and dizziness  . Ace Inhibitors Other (See Comments)    unknown  . Morphine Other (See Comments)    Loopy, light headed feeling    . Propoxyphene N-Acetaminophen Rash    Patient Measurements:   Ht: 63 in Wt: 164 lb  Vital Signs: Temp: 97.8 F (36.6 C) (09/14 1059) Temp src: Oral (09/14 1059) BP: 124/79 mmHg (09/14 1518) Pulse Rate: 94 (09/14 1518) Intake/Output from previous day:   Intake/Output from this shift:    Labs:  Recent Labs  12/20/13 1143  WBC 11.3*  HGB 10.7*  PLT 242  CREATININE 0.71   The CrCl is unknown because both a height and weight (above a minimum accepted value) are required for this calculation. No results found for this basename: VANCOTROUGH, VANCOPEAK, VANCORANDOM, [place], GENTPEAK, GENTRANDOM, [place], TOBRAPEAK, TOBRARND, AMIKACINPEAK, AMIKACINTROU, AMIKACIN,  in the last 72 hours   Microbiology: Recent Results (from the past 720 hour(s))  CULTURE, BLOOD (ROUTINE X 2)     Status: None   Collection Time    11/25/13  5:45 PM      Result Value Ref Range Status   Specimen Description BLOOD ARM RIGHT   Final   Special Requests BOTTLES DRAWN AEROBIC AND ANAEROBIC 10CC   Final   Culture  Setup Time     Final   Value: 11/25/2013 22:39     Performed at [facility]   Culture     Final   Value: NO GROWTH 5 DAYS     Performed at [facility]   Report Status 12/01/2013 FINAL   Final  CULTURE, BLOOD (ROUTINE X 2)     Status: None   Collection  Time    11/25/13  6:00 PM      Result Value Ref Range Status   Specimen Description BLOOD ARM LEFT   Final   Special Requests BOTTLES DRAWN AEROBIC AND ANAEROBIC 10CC   Final   Culture  Setup Time     Final   Value: 11/25/2013 22:41     Performed at [facility]   Culture     Final   Value: NO GROWTH 5 DAYS     Performed at [facility]   Report Status 12/01/2013 FINAL   Final  CLOSTRIDIUM DIFFICILE BY PCR     Status: None   Collection Time    11/26/13  7:19 AM      Result Value Ref Range Status   C difficile by pcr NEGATIVE  NEGATIVE Final  URINE CULTURE     Status: None   Collection Time    11/29/13 11:00 AM      Result Value Ref Range Status   Specimen Description URINE, RANDOM   Final   Special Requests NONE   Final   Culture  Setup Time     Final   Value: 11/29/2013 11:42     Performed at [facility]     Final   Value: NO GROWTH  Performed at [facility]     Final   Value: NO GROWTH     Performed at [facility]   Report Status 11/30/2013 FINAL   Final    Medical History: Past Medical History  Diagnosis Date  . GERD (gastroesophageal reflux disease)   . Hypertension   . 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 CHF (congestive heart failure)     Right side- Dr. Aundra Dubin ([facility])  . Pulmonary HTN   . Anxiety   . Cardiomyopathy   . NSVT (nonsustained ventricular tachycardia)     with syncope: St Jude ICD was implanted. Device now nearing ERI. Given end stage lung disease and normalization of LV function, the device will not be replaced and tachy therapies were turned off  . Chronic chest pain     LHC (08/2008) with no angiographic coronary diease  . Allergic rhinitis   . H/O: iron deficiency anemia   . Secondary amenorrhea     irregular menses  . Psychosis     with high dose steroids  . SVT (supraventricular tachycardia)     possible runs  . Hypokalemia     recurrent  . Rotator cuff sprain    . Chronic back pain     Medications:  See electronic med rec  Assessment: 53 y.o. female presents with SOB. Pt was recently hospitalized for hypoxia- discharged 11/29/13 - received Vanc/Fortaz 8/21-8/23 at previous admission.. To begin broad spectrum antibiotics (Vancomycin and Cefepime) for possible HCAP. Estimated CrCl ~100 ml/min. WBC slightly elevated. Blood cultures pending.  Goal of Therapy:  Vancomycin trough level 15-20 mcg/ml  Plan:  1) Vancomycin 1gm IV q8h. 2) Cefepime 1gm IV q8h. 3) Will f/u micro data, renal function, pt's clinical condition 4) Vanc trough prn  Sherlon Handing, PharmD, BCPS Clinical pharmacist, pager [phone] 12/20/2013,4:02 PM

## 2013-12-20 NOTE — ED Notes (Signed)
Admitting MD at bedside.

## 2013-12-20 NOTE — ED Provider Notes (Signed)
TIME SEEN: 11:24 AM  CHIEF COMPLAINT: Shortness of breath  HPI: Pt is a 53 y.o. female with history of hypertension, CHF, sarcoidosis, COPD, pulmonary hypertension onTyvaso who presents to the emergency department with shortness of breath. Patient reports that she began having chest tightness and feeling short of breath 3 days ago. Her oxygen saturation on her normal 6 L of oxygen was between 60-70%. Her cardiologist increased her Lasix from 40 mg in the morning and 20 mg at night to 40 mg twice a day. She did notice an improvement in the swelling of her feet that started having chills and a cough with yellow sputum production 2 days ago. She states her oxygen saturation is normally 98% on her normal 6 L nasal cannula. Denies a history of PE or DVT. No calf pain. No known sick contacts.  ROS: See HPI Constitutional: no fever  Eyes: no drainage  ENT: no runny nose   Cardiovascular:   chest pain  Resp: SOB  GI: no vomiting GU: no dysuria Integumentary: no rash  Allergy: no hives  Musculoskeletal: no leg swelling  Neurological: no slurred speech ROS otherwise negative  PAST MEDICAL HISTORY/PAST SURGICAL HISTORY:  Past Medical History  Diagnosis Date  . GERD (gastroesophageal reflux disease)   . Hypertension   . 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 CHF (congestive heart failure)     Right side- Dr. Aundra Dubin ([facility])  . Pulmonary HTN   . Anxiety   . Cardiomyopathy   . NSVT (nonsustained ventricular tachycardia)     with syncope: St Jude ICD was implanted. Device now nearing ERI. Given end stage lung disease and normalization of LV function, the device will not be replaced and tachy therapies were turned off  . Chronic chest pain     LHC (08/2008) with no angiographic coronary diease  . Allergic rhinitis   . H/O: iron deficiency anemia   . Secondary amenorrhea     irregular menses  . Psychosis     with high dose steroids  . SVT (supraventricular tachycardia)     possible runs  .  Hypokalemia     recurrent  . Rotator cuff sprain   . Chronic back pain     MEDICATIONS:  Prior to Admission medications   Medication Sig Start Date End Date Taking? Authorizing Provider  albuterol (PROAIR HFA) 108 (90 BASE) MCG/ACT inhaler Inhale 2 puffs into the lungs 4 (four) times daily. 12/07/13   [place], MD  ARIPiprazole (ABILIFY) 2 MG tablet Take 1 tablet (2 mg total) by mouth every other day. 11/01/13   [place], MD  aspirin EC 81 MG tablet Take 1 tablet (81 mg total) by mouth daily. 08/21/13   Waylan Boga, NP  budesonide-formoterol (SYMBICORT) 160-4.5 MCG/ACT inhaler Inhale 1 puff into the lungs 2 (two) times daily. 11/03/13   Sharon [address], MD  esomeprazole (NEXIUM) 40 MG capsule Take 40 mg by mouth every morning. 11/13/13   [place], MD  furosemide (LASIX) 20 MG tablet Take 2 tablets (81m) every morning and take 1 tablet (250m every evening. 11/29/13   AsJanora NorlanderDO  metoprolol tartrate (LOPRESSOR) 25 MG tablet Take 0.5 tablets (12.5 mg total) by mouth 2 (two) times daily. 11/29/13   Ashly M Windell MouldingDO  PARoxetine (PAXIL) 20 MG tablet Take 1 tablet (20 mg total) by mouth daily. 11/03/13   ChSharon Mttreet, MD  potassium chloride SA (K-DUR,KLOR-CON) 20 MEQ tablet Take  1-2 tablets (20-40 mEq total) by mouth 2 (two) times daily. 40 meq in the morning and 20 meq at night 12/07/13   [place], MD  predniSONE (DELTASONE) 10 MG tablet Take 1 tablet (10 mg total) by mouth daily with breakfast. 11/29/13   Janora Norlander, DO  sildenafil (REVATIO) 20 MG tablet Take 4 tablets (80 mg total) by mouth 3 (three) times daily. take 4  tablets every 8 hours 08/21/13   Waylan Boga, NP  Treprostinil (TYVASO) 0.6 MG/ML SOLN Inhale 18 mcg into the lungs 4 (four) times daily. 10/18/13   Larey Dresser, MD    ALLERGIES:  Allergies  Allergen Reactions  . Latex Rash  . Nitroglycerin Other (See Comments)    Patient is on Revatio  . Other  Other (See Comments)    High Dose Steroids cause chemical imbalance and altered mental status  . Meperidine Hcl Other (See Comments)    REACTION: Makes her feel funny  . Tramadol Hcl Other (See Comments)    REACTION: nausea, lightheadedness, sleepiness, and dizziness  . Ace Inhibitors Other (See Comments)    unknown  . Morphine Other (See Comments)    Other   . Propoxyphene N-Acetaminophen Rash    SOCIAL HISTORY:  History  Substance Use Topics  . Smoking status: Former Smoker -- 1.50 packs/day for 11 years    Types: Cigarettes    Quit date: 04/08/1992  . Smokeless tobacco: Never Used     Comment: 1ppd x 20 years  . Alcohol Use: Yes     Comment: remote history of alcohol abuse (quit 1994)    FAMILY HISTORY: Family History  Problem Relation Age of Onset  . Lung cancer Father   . Cancer Father     lung cancer  . Hypertension    . Diabetes Mother     border line  . Hypertension Mother   . Heart failure Mother     EXAM: BP 126/74  Pulse 90  Temp(Src) 97.8 F (36.6 C) (Oral)  Resp 33  SpO2 86% CONSTITUTIONAL: Alert and oriented and responds appropriately to questions. Well-appearing; well-nourished HEAD: Normocephalic EYES: Conjunctivae clear, PERRL ENT: normal nose; no rhinorrhea; moist mucous membranes; pharynx without lesions noted NECK: Supple, no meningismus, no LAD  CARD: RRR; S1 and S2 appreciated; no murmurs, no clicks, no rubs, no gallops RESP: Normal chest excursion without splinting or tachypnea; breath sounds clear and equal bilaterally; no wheezes, no rhonchi, no rales, slightly diminished at her bases, patient is hypoxic on her normal 6 L nasal cannula in the 80s, speaking full sentences, no respiratory distress ABD/GI: Normal bowel sounds; non-distended; soft, non-tender, no rebound, no guarding BACK:  The back appears normal and is non-tender to palpation, there is no CVA tenderness EXT: Normal ROM in all joints; non-tender to palpation; no edema;  normal capillary refill; no cyanosis    SKIN: Normal color for age and race; warm NEURO: Moves all extremities equally PSYCH: The patient's mood and manner are appropriate. Grooming and personal hygiene are appropriate.  MEDICAL DECISION MAKING: Patient here with hypoxia, shortness of breath and chest tightness. Differential diagnosis includes COPD exacerbation, CHF exacerbation, ACS, pneumonia, pulmonary embolus. We'll obtain labs including ABG. We'll give duo neb treatment, Solu-Medrol. We'll obtain chest x-ray. Patient will need admission given she is having increasing oxygen requirement. She is otherwise hemodynamically stable and in no respiratory distress.  ED PROGRESS: Patient has a large Aa gradient. Her PO2 of 65 on a nonrebreather.  CXR shows no acute abnormality.  Will obtain CT chest to eval for PE.   2:41 PM  Pt's CT scan shows chronic right artery obstruction and severe interstitial fibrosis consistent with sarcoidosis and pulmonary hypertension. She does appear to have a left midlung density which could be infectious. Given her leukocytosis, chills and having productive cough, rule treat with broad-spectrum antibiotics for hospital-acquired pneumonia. Have discussed with family medicine for admission to step down. Patient does become very short of breath, tachypnea and speak short sentences with minimal activity such as getting on and off a bed pan.    EKG Interpretation  Date/Time:  Monday December 20 2013 11:09:26 EDT Ventricular Rate:  90 PR Interval:  192 QRS Duration: 88 QT Interval:  407 QTC Calculation: 498 R Axis:   78 Text Interpretation:  Sinus rhythm Consider left ventricular hypertrophy Nonspecific T abnormalities, anterior leads Borderline prolonged QT interval Confirmed by Mahki Spikes,  DO, Manessa Buley [phone]) on 12/20/2013 11:25:28 AM       EKG Interpretation  Date/Time:  Monday December 20 2013 14:13:39 EDT Ventricular Rate:  102 PR Interval:  184 QRS  Duration: 149 QT Interval:  404 QTC Calculation: 526 R Axis:   87 Text Interpretation:  Sinus tachycardia LVH with secondary repolarization abnormality Prolonged QT interval Frequent PVCs Confirmed by Jeanne Diefendorf,  DO, Lachina Salsberry ([id number]) on 12/20/2013 2:30:12 PM        [place], DO 12/20/13 1458

## 2013-12-20 NOTE — H&P (Signed)
[facility] Admission History and Physical Service Pager: [phone]  Patient name: Rachel [name] Medical record number: [id number] Date of birth: 10/06/1960 Age: 53 y.o. Gender: female  Primary Care Provider: Street, Harrell Gave, MD Consultants: None Code Status: Full  Chief Complaint: Shortness of breath   Assessment and Plan: Rachel [name] is a 53 y.o. female presenting with a 3 day history of hypoxia (60-70% on 5-6L of home O2), productive cough, and subjective fevers concerning for HCAP, CHF exacerbation and/or worsening of chronic pulmonary HTN and sarcoidosis. PMH is significant for pulmonary HTN, pulmonary sarcoidosis, cardiomyopathy, CHF, HTN, SVT, major depression with psychotic features, and GERD.  Hypoxia secondary to possible HCAP vs. CHF exacerbation vs. progression of chronic pulmonary HTN and sarcoidosis: Patient presented with a 3 day history of hypoxia (60-70% on home 5-6L O2), productive cough, and subjective fevers. She was admitted in 11/2013 where she was empirically treated with vancomycin and ceftazidime as well as prednisone (34m, as she reports psychosis at higher doses) and IV diuresis. On admission, CT angio (9/14) did not show evidence of pulmonary embolism. CT chest (9/14) showed chronic honeycombing/fibrosis with slight progression compared to prior exam and a possible superimposed infectious process over left midlung. Patient has a leukocytosis of 11.3 on admission. proBNP elevated to 1779. I-stat arterial blood gas 7.394, pC02 51.5, pO2 64, bicarb 31.6 consistent with chronic respiratory acidosis with appropriate metabolic compensation. Hypoxia likely multifactorial. Will treat for presumed HCAP given patient was recently in the hospital and has s/sx as well as imaging findings consistent with possible HCAP. Will also treat for CHF exacerbation given history of CHF and history of recent increased orthopnea, PND, and LE swelling. In addition to  HCAP and CHF exacerbation, progression of chronic pulmonary HTN and sarcoidosis likely contributing.  -- Admit to telemetry -- Keep O2 sats >90%; O2 - Non-rebreather currently.  Will wean as able.  -- Vancomycin and cefepime (9/14 - )  Per pharmacy consult for HCAP coverage given recent hospitalization/clincal picture/CT findings. -- Lasix 432mIV qd, Strict I/Os, daily weights -- Prednisone 2058m pt agreed to 88m55mx of psychosis with steroid use; DuoNebs q4h; Continuing home Symbicort -- F/u blood cultures x 2 -- F/u echocardiogram  Chest pain: Reports 3 days of chest tightness without radiation to jaw/arm, pressure. Most likely secondary to respiratory distress. Unlikely ACS, but will rule out possibility. -- Telemetry -- I-stat troponin negative -- EKG (9/14) shows frequent PVCs, prolonged QT interval (QTc 526); No ischemia noted -- F/u troponin x 3  -- F/u AM EKG  Pulmonary HTN, right-sided CHF: Most recent echo in 4/15 showed mild LVH, EF 60%, mild RA dilatation. Followed by Drs. McClean and Byrum. 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 Followed by Dr. ByruLamonte Sakai- Prednisone 88mg28m Continue home symbicort, Duonebs as above  Normocytic anemia: Hb 10.7 on admission with MCV of 89.7. Most likely secondary to anemia of chronic disease. -- Continue to monitor   HTN: Normotensive. -- Continue home metoprolol  Hx of major depression with psychotic features: Reports that she also experiences psychosis on high-dose steroids. Agreed to try 88mg 90mnisone upon admission.  -- Continue home paroxetine, abilify   Hx of GERD:  -- Continue home protonix 40mg d38m   FEN/GI: SLIV, heart healthy diet Prophylaxis: Lovenox  Disposition: Telemetry  History of Present Illness: Rachel MKEONTA [id number]  y.o. female presenting with a 3 day history of hypoxia (60-70% on 5-6L of home O2),  productive cough, and subjective fevers concerning for HCAP. PMH is significant for pulmonary HTN, pulmonary sarcoidosis, cardiomyopathy, CHF, HTN, SVT, major depression with psychotic features, and GERD. She reports that 3 days prior she started having saturations in the 60s-70s on 6L of home oxygen. She reports associated subjective fever including sweats and chills. She has had a cough productive of yellow sputum. After her previous hospitalization in 8/15, she has been back to her baseline cooking, cleaning, and bathing herself. However, for the past several days she has been unable to preform those tasks to the point where this morning she did not have the strength to take her medicines. She also has been experiencing concurrent chest "tightness" and "stabbing chest pain." She denies radiation to her arm or jaw. She has had some nausea but no vomiting. She has had this pain intermittently over the past few days. She normally sleeps with 2 pillows at night but recently has had to sleep with 3 pillows. She also has experienced PND and noticed increased swelling in her lower extremities.   Review Of Systems: Per HPI  Otherwise 12 point review of systems was performed and was unremarkable.  Patient Active Problem List   Diagnosis Date Noted  . HCAP (healthcare-associated pneumonia) 12/20/2013  . Acute on chronic respiratory failure with hypoxia 11/27/2013  . Hypoxia 11/25/2013  . Dyspnea 11/10/2013  . Spinal stenosis of lumbar region 10/27/2013  . Abnormal involuntary movements 10/26/2013  . Shortness of breath 10/24/2013  . Major depressive disorder with psychotic features 08/31/2013  . Loss of weight 08/12/2013  . SVT (supraventricular tachycardia) 08/05/2013  . CONGESTIVE HEART FAILURE, RIGHT 09/21/2008  . PULMONARY HYPERTENSION 09/05/2008  . PULMONARY SARCOIDOSIS 05/03/2008  . ANXIETY 06/05/2006  . HYPERTENSION, BENIGN SYSTEMIC 06/05/2006  . CARDIOMYOPATHY, IDIOPATHIC 06/05/2006  .  GASTROESOPHAGEAL REFLUX, NO ESOPHAGITIS 06/05/2006   Past Medical History: Past Medical History  Diagnosis Date  . GERD (gastroesophageal reflux disease)   . Hypertension   . 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 CHF (congestive heart failure)     Right side- Dr. Aundra Dubin ([facility])  . Pulmonary HTN   . Anxiety   . Cardiomyopathy   . NSVT (nonsustained ventricular tachycardia)     with syncope: St Jude ICD was implanted. Device now nearing ERI. Given end stage lung disease and normalization of LV function, the device will not be replaced and tachy therapies were turned off  . Chronic chest pain     LHC (08/2008) with no angiographic coronary diease  . Allergic rhinitis   . H/O: iron deficiency anemia   . Secondary amenorrhea     irregular menses  . Psychosis     with high dose steroids  . SVT (supraventricular tachycardia)     possible runs  . Hypokalemia     recurrent  . Rotator cuff sprain   . Chronic back pain    Past Surgical History: Past Surgical History  Procedure Laterality Date  . Cardiac catheterization  08/26/2008    5% EF with normal coronary arteries and normal wall motion  . Adenosine cardiolyte  03/31/2003    no ischemia/infarct; marked global LV hypekinesis; resting EF 22%  . Cardiac catheterization  03/31/2003    globally depressed LV fxn with EF 20%; idiopathic dilated cardiomyopathy  . Defibrillator placed  03/31/2003    St. Jude single chamber (Dr. Cristopher Peru)  .  Pfts      FVC 2000 (56%) FEV1 1400 (47%), ratio 0.7; insignif response to bronchodilatior; stable from 1/05 - 9/05; PFTs: Mod restriction, FVC 1.9L, FEV1 1.6L, both 53% predicted; slt response to brochodilators 04/08/2002   Social History: History  Substance Use Topics  . Smoking status: Former Smoker -- 1.50 packs/day for 11 years    Types: Cigarettes    Quit date: 04/08/1992  . Smokeless tobacco: Never Used     Comment: 1ppd x 20 years  . Alcohol Use: Yes     Comment: remote history of  alcohol abuse (quit 1994)   Please also refer to relevant sections of EMR.  Family History: Family History  Problem Relation Age of Onset  . Lung cancer Father   . Cancer Father     lung cancer  . Hypertension    . Diabetes Mother     border line  . Hypertension Mother   . Heart failure Mother    Allergies and Medications: Allergies  Allergen Reactions  . Latex Rash  . Nitroglycerin Other (See Comments)    Patient is on Revatio  . Other Other (See Comments)    High Dose Steroids cause chemical imbalance and altered mental status  . Meperidine Hcl Other (See Comments)    REACTION: Makes her feel funny  . Tramadol Hcl Other (See Comments)    REACTION: nausea, lightheadedness, sleepiness, and dizziness  . Ace Inhibitors Other (See Comments)    unknown  . Morphine Other (See Comments)    Loopy, light headed feeling    . Propoxyphene N-Acetaminophen Rash   No current facility-administered medications on file prior to encounter.   Current Outpatient Prescriptions on File Prior to Encounter  Medication Sig Dispense Refill  . albuterol (PROAIR HFA) 108 (90 BASE) MCG/ACT inhaler Inhale 2 puffs into the lungs 4 (four) times daily.  18 g  11  . ARIPiprazole (ABILIFY) 2 MG tablet Take 1 tablet (2 mg total) by mouth every other day.  30 tablet  1  . aspirin EC 81 MG tablet Take 1 tablet (81 mg total) by mouth daily.  1 tablet  0  . budesonide-formoterol (SYMBICORT) 160-4.5 MCG/ACT inhaler Inhale 1 puff into the lungs 2 (two) times daily.  1 Inhaler  3  . esomeprazole (NEXIUM) 40 MG capsule Take 40 mg by mouth every morning.      . furosemide (LASIX) 20 MG tablet Take 2 tablets (14m) every morning and take 1 tablet (274m every evening.  90 tablet  0  . PARoxetine (PAXIL) 20 MG tablet Take 1 tablet (20 mg total) by mouth daily.  30 tablet  2  . potassium chloride SA (K-DUR,KLOR-CON) 20 MEQ tablet Take 1-2 tablets (20-40 mEq total) by mouth 2 (two) times daily. 40 meq in the morning  and 20 meq at night  90 tablet  3  . sildenafil (REVATIO) 20 MG tablet Take 4 tablets (80 mg total) by mouth 3 (three) times daily. take 4  tablets every 8 hours  10 tablet  0  . Treprostinil (TYVASO) 0.6 MG/ML SOLN Inhale 18 mcg into the lungs 4 (four) times daily.  3.6 mL  11    Objective: BP 118/62  Pulse 86  Temp(Src) 97.8 F (36.6 C) (Oral)  Resp 21  SpO2 100%  Exam: General: Dyspneic woman with non-rebreather in place in no acute distress HEENT: EOMI. PERRLA.  Cardiovascular: RRR normal s1s2, no m/r/g Respiratory: Crackles throughout, most prominent at bases Abdomen: +bs. Soft, nontender,  nondistended.  Extremities: Trace edema of bilateral lower extremities through the mid-calf Skin: No lesions appreciated.  Neuro: Alert and oriented x 3. CN II-XII grossly intact. Sensation to light touch throughout.   Labs and Imaging: CBC BMET   Recent Labs Lab 12/20/13 1143  WBC 11.3*  HGB 10.7*  HCT 34.1*  PLT 242    Recent Labs Lab 12/20/13 1143  NA 143  K 4.0  CL 101  CO2 29  BUN 14  CREATININE 0.71  GLUCOSE 93  CALCIUM 9.5     BNP: 1779   Ct Angio Chest W/cm &/or Wo Cm  12/20/2013    IMPRESSION: Chronic obstruction the right upper pulmonary artery. No evidence of pulmonary embolism.  Moderate to severe upper lobe predominant interstitial disease in the form of honeycombing/fibrosis compatible with known sarcoidosis with slight progression compared to the prior exam. Mild patchy ground-glass attenuation over the mid to lower lungs slightly more prominent compared to the previous exam. Stable small left effusion with associated basilar atelectasis. Slight mixed interstitial airspace density over the left midlung as cannot exclude a superimposed infectious process.  Mild mediastinal/right hilar adenopathy unchanged.     Dg Chest Portable 1 View  12/20/2013  IMPRESSION: No active cardiopulmonary disease. 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      Verlin Dike, Med Student 12/20/2013, 3:10 PM Acting Intern, [place] Intern pager: [phone], text pages welcome  I have seen and evaluated the above patient and agree with the above note (addendum in blue).    Physical exam: General: Sitting up in bed, nonrebreather in place; no respiratory distress currently. Cardiovascular: RRR. No murmurs, rubs, gallops Respiratory: Crackles diffusely. Normal work of breathing. Abdomen: Soft, nontender, nondistended. Extremities: Trace lower extremity edema. Skin: Warm, dry, intact  Neuro: No focal deficits.  [facility] PGY-3

## 2013-12-20 NOTE — ED Notes (Signed)
Pt called out and states that she is having trouble breathing, this RN went to check on pt and pt oxygen saturation was noted to be 88% with non rebreather not placed properly, Pt breathing labored and tachypneic. This RN placed pt on new non rebreather mask and 15L, pt saturation increased to 98%. Pt breathing unlabored,pt axox 4 . Primary RN notified.

## 2013-12-20 NOTE — Telephone Encounter (Signed)
Spoke w/pt she states she does not feel well and her oxygen levels are ranging from 69-86% on 6 L of oxygen, she states she is SOB and coughing up yellow phlegm, she states she has a tightness and feels like pins sticking in her all through her chest and her back, she verified oxygen is working and there are no kinks or holes in her tubing, she is unsure if she has had any fevers but states she has been sweating a lot, advised pt with symptoms and low oxygen levels she needs to come to ER, she is agreeable and will call 911 when her daughter gets home, will notify Dr Aundra Dubin

## 2013-12-20 NOTE — ED Notes (Signed)
Pt placed on NRB mask. MD at bedside.

## 2013-12-21 DIAGNOSIS — R0989 Other specified symptoms and signs involving the circulatory and respiratory systems: Secondary | ICD-10-CM

## 2013-12-21 DIAGNOSIS — J9620 Acute and chronic respiratory failure, unspecified whether with hypoxia or hypercapnia: Secondary | ICD-10-CM

## 2013-12-21 DIAGNOSIS — K219 Gastro-esophageal reflux disease without esophagitis: Secondary | ICD-10-CM

## 2013-12-21 DIAGNOSIS — R0609 Other forms of dyspnea: Secondary | ICD-10-CM

## 2013-12-21 DIAGNOSIS — I2789 Other specified pulmonary heart diseases: Secondary | ICD-10-CM

## 2013-12-21 DIAGNOSIS — I10 Essential (primary) hypertension: Secondary | ICD-10-CM

## 2013-12-21 DIAGNOSIS — I509 Heart failure, unspecified: Secondary | ICD-10-CM

## 2013-12-21 DIAGNOSIS — R634 Abnormal weight loss: Secondary | ICD-10-CM

## 2013-12-21 DIAGNOSIS — F411 Generalized anxiety disorder: Secondary | ICD-10-CM

## 2013-12-21 DIAGNOSIS — R0902 Hypoxemia: Secondary | ICD-10-CM

## 2013-12-21 DIAGNOSIS — R0602 Shortness of breath: Secondary | ICD-10-CM

## 2013-12-21 DIAGNOSIS — I369 Nonrheumatic tricuspid valve disorder, unspecified: Secondary | ICD-10-CM

## 2013-12-21 DIAGNOSIS — J189 Pneumonia, unspecified organism: Principal | ICD-10-CM

## 2013-12-21 DIAGNOSIS — D869 Sarcoidosis, unspecified: Secondary | ICD-10-CM

## 2013-12-21 LAB — BASIC METABOLIC PANEL
Anion gap: 11 (ref 5–15)
BUN: 16 mg/dL (ref 6–23)
CHLORIDE: 99 meq/L (ref 96–112)
CO2: 28 meq/L (ref 19–32)
CREATININE: 0.65 mg/dL (ref 0.50–1.10)
Calcium: 9 mg/dL (ref 8.4–10.5)
GFR calc Af Amer: >90 mL/min (ref >90)
GFR calc non Af Amer: >90 mL/min (ref >90)
GLUCOSE: 138 mg/dL — AB (ref 70–99)
Potassium: 4.3 mEq/L (ref 3.7–5.3)
Sodium: 138 mEq/L (ref 137–147)

## 2013-12-21 LAB — URINALYSIS, ROUTINE W REFLEX MICROSCOPIC
BILIRUBIN URINE: NEGATIVE
Glucose, UA: NEGATIVE mg/dL
Hgb urine dipstick: NEGATIVE
Ketones, ur: 15 mg/dL — AB
Leukocytes, UA: NEGATIVE
Nitrite: NEGATIVE
PH: 6 (ref 5.0–8.0)
Protein, ur: 30 mg/dL — AB
SPECIFIC GRAVITY, URINE: 1.035 — AB (ref 1.005–1.030)
UROBILINOGEN UA: 0.2 mg/dL (ref 0.0–1.0)

## 2013-12-21 LAB — URINE MICROSCOPIC-ADD ON

## 2013-12-21 LAB — CBC
HEMATOCRIT: 31.1 % — AB (ref 36.0–46.0)
HEMOGLOBIN: 9.9 g/dL — AB (ref 12.0–15.0)
MCH: 28.3 pg (ref 26.0–34.0)
MCHC: 31.8 g/dL (ref 30.0–36.0)
MCV: 88.9 fL (ref 78.0–100.0)
Platelets: 202 10*3/uL (ref 150–400)
RBC: 3.5 MIL/uL — ABNORMAL LOW (ref 3.87–5.11)
RDW: 14.1 % (ref 11.5–15.5)
WBC: 6.3 10*3/uL (ref 4.0–10.5)

## 2013-12-21 LAB — TROPONIN I
Troponin I: <0.3 ng/mL (ref <0.30)
Troponin I: <0.3 ng/mL (ref <0.30)

## 2013-12-21 MED ORDER — FUROSEMIDE 20 MG PO TABS
20.0000 mg | ORAL_TABLET | Freq: Two times a day (BID) | ORAL | Status: DC
  Administered 2013-12-21: 20 mg via ORAL
  Filled 2013-12-21 (×5): qty 1

## 2013-12-21 NOTE — Consult Note (Signed)
Advanced Heart Failure Team History and Physical Note   Primary Physician: Dr. Zigmund Daniel ([facility]) Primary Cardiologist: Dr. 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 She is now on Revatio 80 mg tid and and inhaled Iloprost. She has been evaluated for heart/lung transplant at [facility] as an inpatient but needed to raise around $10,000 to continue evaluation and has decided against this for now. She has been seeing Dr. Lamonte Sakai for her sarcoid.   She has been admitted multiple times in the past 6 months for hypoxia and SOB. Last admission was 8/20-8/24/15. CXR showed severe interstitial lung disease and she was started on IV diuretics, prednisone and abx. Her symptoms improved and she was sent home on a higher dose of PO lasix.   Prior to admission [facility] nurse called concerning weight gain and was instructed to increase lasix to 40 mg BID for 3 days. SOB improved but then increased and O2 sats dropped into the upper 60-70s and told to come to ED.   Last discharge weight was 162 lbs and on admission was 157 lbs. Pertinent labs on admission were pro-BNP 1779, K+ 4.0, creatinine 0.71, WBC 11.3 and negative Tropoinin. Blood cx pending. CT chest (9/14) showed chronic honeycombing/fibrosis with slight progression compared to prior exam and a possible superimposed infectious process over left midlung.  Started on IV lasix, 24 hr I/O -400 cc. Prednisone increased to 20 mg after patient agreeing to has had previous issues with psychosis on higher doses.   Echo (4/15) with EF 60%, normal RV size/systolic function, unable to estimate PA systolic pressure  Review of Systems: [y] = yes, _0  = no   General: Weight gain _1 ; Weight loss _2 ; Anorexia _3 ; Fatigue _4 ; Fever _5 ; Chills _6 ; Weakness _7   Cardiac: Chest pain/pressure _8 ; Resting SOB _9 ; Exertional SOB _10 ; Orthopnea [  ]; Pedal Edema _11 ; Palpitations _12 ; Syncope _13 ; Presyncope _14 ; Paroxysmal nocturnal dyspnea_15   Pulmonary: Cough _16 ; Wheezing_17 ; Hemoptysis_18 ; Sputum _19 ; Snoring _20   GI: Vomiting_21 ; Dysphagia_22 ; Melena_23 ; Hematochezia _24 ; Heartburn_25 ; Abdominal pain _26 ; Constipation _27 ; Diarrhea _28 ; BRBPR _29   GU: Hematuria_30 ; Dysuria _31 ; Nocturia_32   Vascular: Pain in legs with walking _33 ; Pain in feet with lying flat _34 ; Non-healing sores _35 ; Stroke _36 ; TIA _37 ; Slurred speech _38 ;  Neuro: Headaches_39 ; Vertigo_40 ; Seizures_41 ; Paresthesias_42 ;Blurred vision _43 ; Diplopia _44 ; Vision changes _45   Ortho/Skin: Arthritis _46 ; Joint pain _47 ; Muscle pain _48 ; Joint swelling _49 ; Back Pain _50 ; Rash _51   Psych: Depression_52 ; Anxiety_53   Heme: Bleeding problems _54 ; Clotting disorders _55 ; Anemia _56   Endocrine: Diabetes _57 ; Thyroid dysfunction_58   Home Medications Prior to Admission medications   Medication Sig Start Date End Date Taking? Authorizing Provider  albuterol (PROAIR HFA) 108 (90 BASE) MCG/ACT inhaler Inhale 2 puffs into the lungs 4 (four) times daily. 12/07/13  Yes Sharon Mt Street, MD  ARIPiprazole (ABILIFY) 2 MG tablet Take 1 tablet (2 mg total) by mouth every other day. 11/01/13  Yes Sharon Mt Street, MD  aspirin EC 81 MG tablet Take 1 tablet (81 mg total) by mouth  daily. 08/21/13  Yes Waylan Boga, NP  budesonide-formoterol (SYMBICORT) 160-4.5 MCG/ACT inhaler Inhale 1 puff into the lungs 2 (two) times daily. 11/03/13  Yes Sharon Mt Street, MD  esomeprazole (NEXIUM) 40 MG capsule Take 40 mg by mouth every morning. 11/13/13  Yes Sharon Mt Street, MD  furosemide (LASIX) 20 MG tablet Take 2 tablets (17m) every morning and take 1 tablet (279m every evening. 11/29/13  Yes Ashly M Gottschalk, DO  metoprolol tartrate (LOPRESSOR) 25 MG tablet Take 25 mg by mouth 2 (two) times daily.   Yes Historical Provider, MD  PARoxetine (PAXIL) 20 MG tablet Take 1 tablet (20 mg total)  by mouth daily. 11/03/13  Yes ChSharon Mttreet, MD  potassium chloride SA (K-DUR,KLOR-CON) 20 MEQ tablet Take 1-2 tablets (20-40 mEq total) by mouth 2 (two) times daily. 40 meq in the morning and 20 meq at night 12/07/13  Yes ChSharon Mttreet, MD  sildenafil (REVATIO) 20 MG tablet Take 4 tablets (80 mg total) by mouth 3 (three) times daily. take 4  tablets every 8 hours 08/21/13  Yes JaWaylan BogaNP  Treprostinil (TYVASO) 0.6 MG/ML SOLN Inhale 18 mcg into the lungs 4 (four) times daily. 10/18/13  Yes DaLarey DresserMD    Past Medical History: Past Medical History  Diagnosis Date  . GERD (gastroesophageal reflux disease)   . Hypertension   . 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 YoAnnamaria Boots. CHF (congestive heart failure)     Right side- Dr. McAundra DubinLabuer)  . Pulmonary HTN   . Anxiety   . Cardiomyopathy   . NSVT (nonsustained ventricular tachycardia)     with syncope: St Jude ICD was implanted. Device now nearing ERI. Given end stage lung disease and normalization of LV function, the device will not be replaced and tachy therapies were turned off  . Chronic chest pain     LHC (08/2008) with no angiographic coronary diease  . Allergic rhinitis   . H/O: iron deficiency anemia   . Secondary amenorrhea     irregular menses  . Psychosis     with high dose steroids  . SVT (supraventricular tachycardia)     possible runs  . Hypokalemia     recurrent  . Rotator cuff sprain   . Chronic back pain     Past Surgical History: Past Surgical History  Procedure Laterality Date  . Cardiac catheterization  08/26/2008    5% EF with normal coronary arteries and normal wall motion  . Adenosine cardiolyte  03/31/2003    no ischemia/infarct; marked global LV hypekinesis; resting EF 22%  . Cardiac catheterization  03/31/2003    globally depressed LV fxn with EF 20%; idiopathic dilated cardiomyopathy  . Defibrillator placed  03/31/2003    St. Jude single chamber (Dr. GrCristopher Peru . Pfts      FVC  2000 (56%) FEV1 1400 (47%), ratio 0.7; insignif response to bronchodilatior; stable from 1/05 - 9/05; PFTs: Mod restriction, FVC 1.9L, FEV1 1.6L, both 53% predicted; slt response to brochodilators 04/08/2002    Family History: Family History  Problem Relation Age of Onset  . Lung cancer Father   . Cancer Father     lung cancer  . Hypertension    . Diabetes Mother     border line  . Hypertension Mother   . Heart failure Mother     Social History: History   Social History  . Marital Status: Divorced    Spouse Name: N/A  Number of Children: N/A  . Years of Education: N/A   Social History Main Topics  . Smoking status: Former Smoker -- 1.50 packs/day for 11 years    Types: Cigarettes    Quit date: 04/08/1992  . Smokeless tobacco: Never Used     Comment: 1ppd x 20 years  . Alcohol Use: Yes     Comment: remote history of alcohol abuse (quit 1994)  . Drug Use: No  . Sexual Activity: None   Other Topics Concern  . None   Social History Narrative  . None    Allergies:  Allergies  Allergen Reactions  . Latex Rash  . Nitroglycerin Other (See Comments)    Patient is on Revatio  . Other Other (See Comments)    High Dose Steroids cause chemical imbalance and altered mental status  . Meperidine Hcl Other (See Comments)    REACTION: Makes her feel funny  . Tramadol Hcl Other (See Comments)    REACTION: nausea, lightheadedness, sleepiness, and dizziness  . Ace Inhibitors Other (See Comments)    unknown  . Morphine Other (See Comments)    Loopy, light headed feeling    . Propoxyphene N-Acetaminophen Rash    Objective:    Vital Signs:   Temp:  [97.5 F (36.4 C)-98.5 F (36.9 C)] 97.5 F (36.4 C) (09/15 1241) Pulse Rate:  [61-101] 74 (09/15 1241) Resp:  [13-30] 22 (09/15 1100) BP: (89-140)/(37-96) 119/70 mmHg (09/15 1241) SpO2:  [95 %-100 %] 97 % (09/15 1241) FiO2 (%):  [40 %-55 %] 40 % (09/15 1106) Weight:  [157 lb 3 oz (71.3 kg)-157 lb 10.1 oz (71.5 kg)] 157  lb 3 oz (71.3 kg) (09/15 0300)   Filed Weights   12/20/13 1645 12/21/13 0300  Weight: 157 lb 10.1 oz (71.5 kg) 157 lb 3 oz (71.3 kg)    Physical Exam: General:  Ill appearing. On facemask HEENT: normal Neck: supple. JVP 8 . Carotids 2+ bilat; no bruits. No lymphadenopathy or thryomegaly appreciated. Cor: PMI nondisplaced. Regular rate & rhythm. 3/6 TR + RV lift. Loud p2 Lungs: mild crackles on R. decreased air movement throughout Abdomen: soft, mildly tender liver edge down, nondistended. No hepatosplenomegaly. No bruits or masses. Good bowel sounds. Extremities: no cyanosis, clubbing, rash, edema Neuro: alert & orientedx3, cranial nerves grossly intact. moves all 4 extremities w/o difficulty. Affect pleasant  Telemetry: SR  Labs: Basic Metabolic Panel:  Recent Labs Lab 12/20/13 1143 12/21/13 0138  NA 143 138  K 4.0 4.3  CL 101 99  CO2 29 28  GLUCOSE 93 138*  BUN 14 16  CREATININE 0.71 0.65  CALCIUM 9.5 9.0    Liver Function Tests: No results found for this basename: AST, ALT, ALKPHOS, BILITOT, PROT, ALBUMIN,  in the last 168 hours No results found for this basename: LIPASE, AMYLASE,  in the last 168 hours No results found for this basename: AMMONIA,  in the last 168 hours  CBC:  Recent Labs Lab 12/20/13 1143 12/21/13 0138  WBC 11.3* 6.3  NEUTROABS 8.5*  --   HGB 10.7* 9.9*  HCT 34.1* 31.1*  MCV 89.7 88.9  PLT 242 202    Cardiac Enzymes:  Recent Labs Lab 12/20/13 1901 12/21/13 0138 12/21/13 0815  TROPONINI <0.30 <0.30 <0.30    BNP: BNP (last 3 results)  Recent Labs  11/10/13 2310 11/26/13 0330 12/20/13 1143  PROBNP 1546.0* 649.1* 1779.0*    CBG: No results found for this basename: GLUCAP,  in the last 168 hours  Coagulation Studies: No results found for this basename: LABPROT, INR,  in the last 72 hours  Other results: EKG: SR 90 LVH No ST-T wave abnormalities.    Imaging: Ct Angio Chest W/cm &/or Wo Cm  12/20/2013   CLINICAL  DATA:  Shortness of breath 3 days in patient with known sarcoidosis and CHF.  EXAM: CT ANGIOGRAPHY CHEST WITH CONTRAST  TECHNIQUE: Multidetector CT imaging of the chest was performed using the standard protocol during bolus administration of intravenous contrast. Multiplanar CT image reconstructions and MIPs were obtained to evaluate the vascular anatomy.  CONTRAST:  50 mL Omnipaque 350 IV.  COMPARISON:  09/10/2008  FINDINGS: Left-sided pacemaker is present. Lungs are adequately inflated with continued moderate to severe upper lobe predominant interstitial disease with cystic change/fibrosis without significant change. There are patchy areas of ground-glass attenuation over the mid to lower lungs slightly more pronounced. Mild mixed interstitial airspace density over the left midlung. Small left pleural effusion with associated basilar atelectasis without significant change.  There is prominence of the main and proximal pulmonary arteries bilaterally. There is chronic obstruction of the right upper lobe pulmonary artery. There is no evidence pulmonary embolism. Mild mesenteric and right hilar adenopathy is unchanged. Remaining mediastinal structures are unchanged.  Images through the upper abdomen are unremarkable. Remainder the exam is unchanged.  Review of the MIP images confirms the above findings.  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 No evidence of pulmonary embolism.  Moderate to severe upper lobe predominant interstitial disease in the form of honeycombing/fibrosis compatible with known sarcoidosis with slight progression compared to the prior exam. Mild patchy ground-glass attenuation over the mid to lower lungs slightly more prominent compared to the previous exam. Stable small left effusion with associated basilar atelectasis. Slight mixed interstitial airspace density over the left midlung as cannot exclude a superimposed infectious process.  Mild mediastinal/right hilar  adenopathy unchanged.   Electronically Signed   By: Marin Olp M.D.   On: 12/20/2013 14:32   Dg Chest Portable 1 View  12/20/2013   CLINICAL DATA:  Shortness of breath for 3 days  EXAM: PORTABLE CHEST - 1 VIEW  COMPARISON:  January 25, 2014  FINDINGS: The heart size and mediastinal contours are stable. The heart size is enlarged. Cardiac pacemaker is unchanged. There is marked diffuse fibrosis of both lungs unchanged compared to prior exam. There is no pleural effusion. The visualized skeletal structures are stable.  IMPRESSION: No active cardiopulmonary disease. 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   Electronically Signed   By: Abelardo Diesel M.D.   On: 12/20/2013 12:33         Assessment:   1) Acute on chronic respiratory failure 2) Pulmonary arterial HTN 3) HCAP 4) Sarcoidosis 5) Anemia on chronic disease  Plan/Discussion:     Length of Stay: 1 Rande Brunt 12/21/2013, 1:46 PM  Advanced Heart Failure Team Pager [phone] (M-F; 7a - 4p)  Please contact [facility] for night-coverage after hours (4p -7a ) and weekends on [email or web address]  Patient seen and examined with Junie Bame, NP. We discussed all aspects of the encounter. I agree with the assessment and plan as stated above.   She has recurrent respiratory failure. I suspect this is most likely infectious based on symptoms and fact that weight is actually down more from recent discharge. Can give one more dose IV lasix 40 to see if this will help. Antibiotics per primary team.   Benay Spice 3:46 PM

## 2013-12-21 NOTE — Progress Notes (Signed)
Family Medicine PGY-3 PCP Note  Pt seen at bedside. She expresses regret that she has been readmitted before being able to see me in clinic (she has been admitted, discharged, and readmitted before seeing me for hospital follow-up a few times, now). She states she is feeling some better but even light activity increases her shortness of breath even while on NRB mask.  Pt expresses that she would like to potentially stop some of her medications (primarily Paxil), as she feels she feels worse with them than without them and does not feel she needs a frank antidepressant daily, but perhaps could benefit from something she could take as needed for anxiety; interestingly she seems to associate starting Paxil with having a UTI. She also relates that she is no longer wanting to see her counselor [name]," to whom she refers as her "psychiatrist," but who I believe is actually psychologist or licensed counselor), as she feels she does not need those services. We discussed that once she is over her acute illness, she and I can discuss this further as an outpatient -- I feel she should probably stay on Paxil for now, but I am perfectly willing to discuss options with her and possibly wean off of Paxil and try something perhaps like Buspar PRN for anxiety, as an outpatient.  Of note, pt has adamantly declined discussing palliative measures, despite the severity of her lung disease and apparent progression over the past several months. It may be appropriate to formally consult pulmonology and/or try to arrange close follow-up with them as an outpatient. I appreciate the excellent care provided by the inpatient FPTS and all consultants as well as all nursing and other ancillary staff on behalf of my patient. Please do not hesitate to contact me if I can be of any help.  Emmaline Kluver, MD PGY-3, [facility] 12/21/2013, 1:55 PM First call: [place] pager: [phone] (text pages welcome through  [facility]) Personal pager: [phone]

## 2013-12-21 NOTE — Progress Notes (Signed)
[facility]  Patient Status: Active (receiving services up to time of hospitalization)  [facility] is providing the following services: RN and PT  If patient discharges after hours, please call [phone].   Rachel [name] 12/21/2013, 10:33 AM

## 2013-12-21 NOTE — Discharge Summary (Signed)
[facility] Discharge Summary  Patient name: Rachel [name] Medical record number: [id number] Date of birth: 01/03/1961 Age: 53 y.o. Gender: female Date of Admission: 12/20/2013  Date of Discharge: 12/25/2013 Admitting Physician: Lupita Dawn, MD  Primary Care Provider: Christa See, MD Consultants: Cardiology, Pulmonology (phone call to primary pulmonologist, Dr. Lamonte Sakai)  Indication for Hospitalization: Hypoxia   Discharge Diagnoses/Problem List:  Hypoxia secondary to possible HCAP vs. CHF exacerbation vs. progression of chronic pulmonary HTN and sarcoidosis Chest pain, likely costochondritis Pulmonary HTN CHF Pulmonary sarcoidosis Normocytic anemia UTI HTN Major depression with psychotic features GERD   Disposition: Discharge to home  Discharge Condition: Improved  Discharge Exam:  General: Pleasant woman with [place] in place.  Cardiovascular: RRR normal s1s2, no m/r/g.  Respiratory: Fine crackles throughout. Improved since admission.  Abdomen: +bs. Soft, nondistended. Tenderness to palpation of suprapubic region improving.  Extremities: Trace pitting edema to the mid-calf.  Skin: No lesions appreciated.  Neuro: Alert and oriented x 3. No gross abnormalities   Brief Hospital Course:  Rachel [name] is a 53 y.o. female who presented on 9/15 with 3 days of hypoxia (60-70% on 5-6L of home O2), productive cough, and subjective fevers as well as recent increased orthopnea, PND, and LE edema concerning for HCAP, CHF exacerbation and/or worsening of chronic pulmonary HTN and sarcoidosis. PMH is significant for pulmonary HTN, pulmonary sarcoidosis, cardiomyopathy, CHF, HTN, SVT, major depression with psychotic features, and GERD.   Hypoxia secondary to possible HCAP vs. CHF exacerbation vs. progression of chronic pulmonary HTN and sarcoidosis: Patient presented with a 3 day history of hypoxia (60-70% on home 5-6L O2), productive cough, and subjective fevers  as well as recent increased orthopnea, PND, and LE edema. CT angio (9/14) did not show evidence of pulmonary embolism. CT chest (9/14) showed chronic honeycombing/fibrosis with slight progression compared to prior exam and a possible superimposed infectious process over left midlung. On admission, WBC 11.3, proBNP 1779. I-stat arterial blood gas 7.394, pC02 51.5, pO2 64, bicarb 31.6 consistent with chronic respiratory acidosis with appropriate metabolic compensation.Hypoxia likely multifactorial in context of possible HCAP, CHF exacerbation, and progression of chronic disease.  Treated for presumed HCAP with vancomycin and cefepime (9/14-9/18) given patient was recently in the hospital and has s/sx as well as imaging findings consistent with possible HCAP. Transitioned to Levaquin 771m from 9/18-9/20 for a total 7 day course of antibiotics.   Blood cultures showed no growth. Treated for CHF exacerbation given history of recent increased orthopnea, PND, and LE swelling. Treated with IV Lasix 443mqd (9/15) and transitioned to home 4057mID for remainder of hospitalization. Echo (12/21/13) showed an of EF 55-60%, moderately dilated RV, and mildly reduced systolic function. Echo (07/21/13) showed mild LVH, EF 60%, and mild RA dilatation. In addition to possible HCAP and CHF exacerbation, progression of chronic pulmonary HTN and sarcoidosis likely contributing and thus was treated with prednisone 23m72matient reports history of psychosis with high-dose steriods but agreed to 23mg53mr 5 days and was discharged home on 10mg 40mrednisone for a total of 14 days until she can follow up with her pulmonologist. She was given DuoNebs and maintained on home Symbicort. Her primary pulmonologist, Dr. Byrum,Lamonte Sakainotified and agreed with the plan. She was started on 15L/min on a non re-breather upon admission and weaned to 6L on discharge. Per Dr. Byrum'Agustina Carolimary pulmonologist) recommendations patient was titrated and sent  home with an oxymizer.   Chest pain, likely costochondritis:  Reports 3 days of chest tightness without radiation to jaw/arm and pressure that is reproducible on exam. Most likely costochondritis in setting of coughing. EKG on 9/14 showed frequent PVCs, prolonged QT interval (QTc 526), TWI V1-V2 that were not present in 11/2013. Repeat EKG on 9/15 showed occasional PVCs, prolonged QTc (470), and TWI in V1-V3. Troponins x 3 were negative. Cardiology consulted and no intervention was pursued. Pain was resolved at time of discharge.   Pulmonary HTN, right-sided CHF: Most recent echo in 4/15 showed mild LVH, EF 60%, mild RA dilatation. Followed by Drs. McClean and Byrum. Continued on home sildenafil, treprostinil, and metoprolol. Echo (12/21/13) showed an of EF 55-60%, moderately dilated RV, and mildly reduced systolic function.  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 Followed by Dr. Lamonte Sakai. Given prednisone 81m for 5 days and discharged on 158m She was also titrated and sent home with an oxymizer. Called and discussed with primary pulmonologist, Dr. ByLamonte Sakaiwho agreed with the plan. Unable to give higher doses of steriods as patient reports past history of psychosis. She was continued on her home symbicort and given DuoNebs.   Normocytic anemia: Hb 10.7 on admission with MCV of 89.7. Most likely secondary to anemia of chronic disease. 9.9 upon discharge. Baseline between 10-11.   UTI: Patient reports suprapubic pain, dysuria. UA was collected after being on antibiotics and showed ketones and protein. Vancomycin/cefepime to covered possible urinary pathogen and her symptoms improved upon discharge.  HTN: Intermittent hypotension. Decreased home metoprolol from 253mID to 12.5mg34mD. Continued home lasix.  Hx of major depression with psychotic features: Reports that she also experiences psychosis on high-dose steroids. Agreed to 20mg8mdnisone upon admission. Continued home paroxetine, abilify.    Hx of GERD: Continued on home protonix 40mg 95my    Issues for Follow Up:  1) Follow-up with Dr. Byrum Lamonte Sakaiding recent hypoxia, need for chronic prednisone (discharged on 10mg d71m), and use of oximizer. Oximizer was titrated by RT prior to discharge.  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 No evidence of pulmonary embolism.  Moderate to severe upper lobe predominant interstitial disease in the form of honeycombing/fibrosis compatible with known sarcoidosis with slight progression compared to the prior exam. Mild patchy ground-glass attenuation over the mid to lower lungs slightly more prominent compared to the previous exam. Stable small left effusion with associated basilar atelectasis. Slight mixed interstitial airspace density over the left midlung as cannot exclude a superimposed infectious process.  Mild mediastinal/right hilar adenopathy unchanged.    Results/Tests Pending at Time of Discharge: None  Discharge Medications:    Medication List         albuterol 108 (90 BASE) MCG/ACT inhaler  Commonly known as:  PROAIR HFA  Inhale 2 puffs into the lungs 4 (four) times daily.     ARIPiprazole 2 MG tablet  Commonly  known as:  ABILIFY  Take 1 tablet (2 mg total) by mouth every other day.     aspirin EC 81 MG tablet  Take 1 tablet (81 mg total) by mouth daily.     budesonide-formoterol 160-4.5 MCG/ACT inhaler  Commonly known as:  SYMBICORT   Inhale 1 puff into the lungs 2 (two) times daily.     esomeprazole 40 MG capsule  Commonly known as:  NEXIUM  Take 40 mg by mouth every morning.     furosemide 20 MG tablet  Commonly known as:  LASIX  Take 2 tablets (45m) every morning and take 1 tablet (272m every evening.     levofloxacin 750 MG tablet  Commonly known as:  LEVAQUIN  Take 1 tablet (750 mg total) by mouth daily.     metoprolol tartrate 12.5 mg Tabs tablet  Commonly known as:  LOPRESSOR  Take 0.5 tablets (12.5 mg total) by mouth 2 (two) times daily.     PARoxetine 20 MG tablet  Commonly known as:  PAXIL  Take 1 tablet (20 mg total) by mouth daily.     potassium chloride SA 20 MEQ tablet  Commonly known as:  K-DUR,KLOR-CON  Take 1-2 tablets (20-40 mEq total) by mouth 2 (two) times daily. 40 meq in the morning and 20 meq at night     predniSONE 10 MG tablet  Commonly known as:  DELTASONE  Take 1 tablet (10 mg total) by mouth daily with breakfast.     sildenafil 20 MG tablet  Commonly known as:  REVATIO  Take 4 tablets (80 mg total) by mouth 3 (three) times daily. take 4  tablets every 8 hours     Treprostinil 0.6 MG/ML Soln  Commonly known as:  TYVASO  Inhale 18 mcg into the lungs 4 (four) times daily.        Discharge Instructions: Please refer to Patient Instructions section of EMR for full details.  Patient was counseled important signs and symptoms that should prompt return to medical care, changes in medications, dietary instructions, activity restrictions, and follow up appointments.   Follow-Up Appointments: Follow-up Information   [facility](Call to have re-evaluation for liquid oxgen with oxymizer)    Contact information:   [address] [phone]     Follow up with Street, ChHarrell GaveMD. Schedule an appointment as soon as possible for a visit on 01/11/2014. (_0 :45 am )    Specialty:  Family Medicine   Contact  information:   [phone]     Follow up with [facility](Physical Therapy and Registered Nurse)    Contact information:   [id number]/21/2015, 1:55 PM Acting Intern, [facility]  Upper Level Addendum:  I have seen and evaluated this patient along with MS Ms. CrFoye Deernd reviewed the above note, making necessary revisions [facility]  JeClearance CootsMD Family Medicine PGY-2

## 2013-12-21 NOTE — Progress Notes (Signed)
CARE MANAGEMENT NOTE 12/21/2013  Patient:  Rachel [name], Rachel [name]   Account Number:  [id number]  Date Initiated:  12/21/2013  Documentation initiated by:  DAVIS,RHONDA  Subjective/Objective Assessment:   53 y.o. female presenting with a 3 day history of hypoxia (60-70% on 5-6L of home O2), productive cough, and subjective fevers concerning for HCAP, CHF exacerbation and/or worsening of chronic pulmonary HTN and sarcoidosis.     Action/Plan:   return to home with o2 and hhc   Anticipated DC Date:  12/24/2013   Anticipated DC Plan:  [place]  In-house referral  NA      DC Planning Services  CM consult      [facility] Choice  Resumption Of Svcs/PTA Provider   Choice offered to / List presented to:  NA      DME agency  Accident.   Status of service:  In process, will continue to follow Medicare Important Message given?   (If response is "NO", the following Medicare IM given date fields will be blank) Date Medicare IM given:   Medicare IM given by:   Date Additional Medicare IM given:   Additional Medicare IM given by:    Discharge Disposition:    Per UR Regulation:  Reviewed for med. necessity/level of care/duration of stay  If discussed at [place] of Stay Meetings, dates discussed:    Comments:  [id number]/Rhonda Davis,RN,BSN,CCM:

## 2013-12-21 NOTE — Progress Notes (Signed)
Family Medicine Teaching Service Daily Progress Note Intern Pager: [phone]  Patient name: Rachel [name] Medical record number: [id number] Date of birth: 1960/11/13 Age: 53 y.o. Gender: female  Primary Care Provider: Christa See, MD Consultants: None Code Status: Full   Pt Overview and Major Events to Date:  9/14: admitted for dyspnea, prednisone 20 mg started, HCAP coverage    9/15: Cards c/s due to TWI on EKG   ABX/Culture Vanc 9/14>> Cefepime 9/14>>  Blood cx 9/14 >>NGTD   Assessment and Plan:  Rachel [name] is a 53 y.o. female who presented on 9/15 with 3 days of hypoxia (60-70% on 5-6L of home O2), productive cough, and subjective fevers as well as recent increased orthopnea, PND, and LE edema concerning for HCAP, CHF exacerbation and/or worsening of chronic pulmonary HTN and sarcoidosis. PMH is significant for pulmonary HTN, pulmonary sarcoidosis, cardiomyopathy, CHF, HTN, SVT, major depression with psychotic features, and GERD.   Hypoxia secondary to possible HCAP vs. CHF exacerbation vs. progression of chronic pulmonary HTN and sarcoidosis: Patient presented with a 3 day history of hypoxia (60-70% on home 5-6L O2), productive cough, and subjective fevers as well as recent increased orthopnea, PND, and LE edema. CT angio (9/14) did not show evidence of pulmonary embolism. CT chest (9/14) showed chronic honeycombing/fibrosis with slight progression compared to prior exam and a possible superimposed infectious process over left midlung. On admission, WBC 11.3, proBNP 1779. I-stat arterial blood gas 7.394, pC02 51.5, pO2 64, bicarb 31.6 consistent with chronic respiratory acidosis with appropriate metabolic compensation. Hypoxia likely multifactorial in context of possible HCAP, CHF exacerbation, and progression of chronic disease.  -- Keep O2 sats >90%; on non-rebreather (15L/min), consulted respiratory to assist weaning -- Vancomycin and cefepime (9/14 - ), per pharmacy  recommendations for HCAP (given recent hospitalization)  -- Lasix 67m IV qd, Strict I/Os (net out 400), daily weights (no change) -- Prednisone 273m- pt agreed to 2015mhx of psychosis with steroid use; DuoNebs q4h; home symbicort   -- F/u blood cultures x 2  -- F/u echocardiogram   Chest pain: Reports 3 days of chest tightness without radiation to jaw/arm, pressure that is reproducible on exam. Most likely costochondritis in setting of coughing. Evidence of TWI on 9/15 EKG but troponin x3 negative. -- Consulted cardiology, appreciate recommendations  -- EKG (9/14) shows frequent PVCs, prolonged QT interval (QTc 526), TWI V1-V2 (not present 11/2013) -- EKG (9/15) shows occasional PVCs, prolonged QT (Qtc 470), TWI in V1-V3 -- Troponin x 3 negative   Pulmonary HTN, right-sided CHF: Most recent echo in 4/15 showed mild LVH, EF 60%, mild RA dilatation. Followed by Drs. McClean and Byrum.  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 Followed by Dr. ByrLamonte Sakai-- Prednisone 29m85m- Continue home symbicort. DuoNebs as above  Normocytic anemia: Hb 10.7 on admission with MCV of 89.7. Most likely secondary to anemia of chronic disease. Downtrended to 9.9 on 9/15. Baseline between 10-11. -- Continue to monitor   Possible UTI: Patient reports suprapubic pain, dysuria. Expect vancomycin/cefepime to cover possible urinary pathogen.  -- F/u UA; gathered after starting antibiotics.   HTN: Intermittent hypotension. -- Continue home metoprolol  -- Hold home Lasiz  Hx of major depression with psychotic features: Reports that she also experiences psychosis on high-dose steroids. Agreed to try 29mg17mdnisone upon admission.  -- Continue home paroxetine, abilify  Hx of GERD:  -- Continue home protonix 109m daily   FEN/GI: SLIV, heart healthy diet  Prophylaxis: Lovenox   Disposition:  Telemetry  Subjective: Reports her dyspnea is improving since admission, still on the 15L/min non re-breather. She feels her lower extremity edema may have decreased some. She has subjective fevers with sweats and chills. She continues to cough up yellow phelgm. She reports some lower abdominal pain, like "a pressure" when she urinates with associated dysuria that feels like a prior UTI. She had diarrhea 2 days ago but has not had a BM since. She continues to have intermittent, sharp chest pain associated with shortness of breath that she reports is reproducible when pressing hard on her chest.   Objective: Temp:  [97.6 F (36.4 C)-98.3 F (36.8 C)] 98.3 F (36.8 C) (09/15 0300) Pulse Rate:  [61-101] 70 (09/15 0500) Resp:  [13-33] 18 (09/15 0500) BP: (98-140)/(61-96) 120/71 mmHg (09/15 0400) SpO2:  [83 %-100 %] 98 % (09/15 0500) FiO2 (%):  [55 %] 55 % (09/15 0500) Weight:  [157 lb 3 oz (71.3 kg)-157 lb 10.1 oz (71.5 kg)] 157 lb 3 oz (71.3 kg) (09/15 0300)  Physical Exam: General: Pleasant woman with non re-breather in place, NAD HEENT: EOMI. PERRLA. MMM. Cardiovascular: RRR normal s1s2, no m/r/g  Respiratory: Crackles throughout, most prominent at bases. Slightly improved from admission. Abdomen: +bs. Soft, nondistended. Tenderness to palpation of suprapubic region.  Extremities: Trace edema of bilateral lower extremities through the mid-calf, similar to admission. Skin: No lesions appreciated.  Neuro: Alert and oriented x 3. 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 No evidence of  pulmonary embolism. Moderate to severe upper lobe predominant interstitial disease in the form of honeycombing/fibrosis compatible with known sarcoidosis with slight progression compared to the prior exam. Mild patchy ground-glass attenuation over the mid to lower lungs slightly more prominent compared to the previous exam. Stable small left effusion with associated basilar atelectasis. Slight mixed interstitial airspace density over the left midlung as cannot exclude a superimposed infectious process. Mild mediastinal/right hilar adenopathy unchanged.   Dg Chest Portable 1 View  12/20/2013 IMPRESSION: No active cardiopulmonary disease. 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   CVerlin Dike Med Student 12/21/2013, 7:18 AM Acting Intern, CCambridge CityIntern pager: [phone] text pages welcome  Upper Level Addendum:  I have seen and evaluated this patient along with MS Ms. CFoye Deerand reviewed the above note, [facility]   General: Pleasant woman with non re-breather in place, NAD Cardiovascular: RRR normal s1s2, no m/r/g  Respiratory: Crackles throughout, most prominent at bases.  Extremities: Trace edema of bilateral lower extremities through the mid-calf Skin: No lesions appreciated.  Neuro: No gross deficits  JClearance Coots MD [facility] PGY-2

## 2013-12-21 NOTE — H&P (Signed)
FMTS Attending Note  I personally saw and evaluated the patient. The plan of care was discussed with the resident team. I agree with the assessment and plan as documented by the resident.   53 y/o female with PMH sarcoidosis with pulmonary involvement, pulmonary HTN, cardiomyopathy, CHF, HTN, and depression presents with acute dyspnea, clinical pictures suggestive of HCAP vs CHF exacerbation vs progression of pulmonary HTN. Patient started on Vancomycin/Cefepime/and steroids overnight, breathing is slightly improved however still requiring venti- mask. Patient also has lower quadrant abdominal pain, associated dysuria.   Please refer to resident note for additional HPI.  Vitals: reviewed Gen: pleasant female, NAD, venti mask in place HEENT: normocephalic, PERRL, EOMI, MMM Cardiac: RRR, S1 and S2 present, no murmurs, no heaves/thrills Resp: diffuse crackles, increased work of breathing Abd: soft, bilateral lower quadrant abdominal pain Ext: trace pedal edema  Reviewed lab work and CT chest  Assessment and Plan: 53 y/o female admitted with acute respiratory failure 1. Acute respiratory failure - likely multifactorial, currently on Vanco/Cefepime to treat suspected HCAP, receiving IV diuresis for treatment of possible CHF exacerbation, also on steroids to cover possible worsening of pulmonary sarcoidosis, patient responding to therapy 2. Lower abdominal pain - suspect UTI, check UA 3. Other medical conditions stable, agree with plan per resident physician.   Dossie Arbour MD

## 2013-12-21 NOTE — Progress Notes (Signed)
FMTS ATTENDING NOTE Ronette Deter  I have discussed this patient with the resident and Dr Ree Kida. I agree with the resident's findings, assessment and care plan.

## 2013-12-21 NOTE — Progress Notes (Signed)
Echocardiogram 2D Echocardiogram has been performed.  Rachel [name], [place] 12/21/2013, 3:59 PM

## 2013-12-22 ENCOUNTER — Ambulatory Visit: Payer: Self-pay | Admitting: Family Medicine

## 2013-12-22 LAB — CBC
HEMATOCRIT: 33 % — AB (ref 36.0–46.0)
Hemoglobin: 10 g/dL — ABNORMAL LOW (ref 12.0–15.0)
MCH: 28.2 pg (ref 26.0–34.0)
MCHC: 30.3 g/dL (ref 30.0–36.0)
MCV: 93 fL (ref 78.0–100.0)
PLATELETS: 228 10*3/uL (ref 150–400)
RBC: 3.55 MIL/uL — ABNORMAL LOW (ref 3.87–5.11)
RDW: 14.1 % (ref 11.5–15.5)
WBC: 13.4 10*3/uL — AB (ref 4.0–10.5)

## 2013-12-22 MED ORDER — FUROSEMIDE 40 MG PO TABS
40.0000 mg | ORAL_TABLET | Freq: Two times a day (BID) | ORAL | Status: DC
  Administered 2013-12-22 – 2013-12-25 (×7): 40 mg via ORAL
  Filled 2013-12-22 (×8): qty 1

## 2013-12-22 NOTE — Progress Notes (Signed)
Nutrition Brief Note  Patient identified on the Malnutrition Screening Tool (MST) Report Pt reports no recent weight changes (usual weight 161 lb) and currently has a good appetite.  Pt did request a list of foods that are part of a healthy diet. Provided pt with copy of General, Healthy Nutrition Therapy. Encouraged fruits, vegetables, whole grains, lean meats and low fat dairy. Pt with no specific questions.   Wt Readings from Last 15 Encounters:  12/22/13 161 lb 2.5 oz (73.1 kg)  12/03/13 164 lb (74.39 kg)  11/29/13 162 lb 14.7 oz (73.9 kg)  11/25/13 163 lb 8 oz (74.163 kg)  11/13/13 162 lb 4.1 oz (73.6 kg)  11/01/13 165 lb (74.844 kg)  10/27/13 162 lb 1.6 oz (73.528 kg)  10/06/13 161 lb (73.029 kg)  09/27/13 162 lb (73.483 kg)  09/23/13 160 lb (72.576 kg)  08/26/13 170 lb 6.4 oz (77.293 kg)  08/17/13 174 lb 9.6 oz (79.198 kg)  08/12/13 173 lb (78.472 kg)  08/05/13 176 lb 8 oz (80.06 kg)  07/31/13 169 lb (76.658 kg)    Body mass index is 28.55 kg/(m^2). Patient meets criteria for overweight based on current BMI.   Current diet order is Heart Healthy, patient is consuming approximately 100% of meals at this time. Labs and medications reviewed.   No nutrition interventions warranted at this time. If nutrition issues arise, please consult RD.   [place], [place], [place] Pager [phone] After Hours Pager]

## 2013-12-22 NOTE — Progress Notes (Signed)
Family Medicine Teaching Service Daily Progress Note Intern Pager: [phone]  Patient name: Rachel [name] Medical record number: [id number] Date of birth: 1960-11-25 Age: 53 y.o. Gender: female  Primary Care Provider: Christa See, MD Consultants: Cardiology Code Status: Full   Pt Overview and Major Events to Date: 9/14: admitted for dyspnea, prednisone 79m started, HCAP coverate 9/15: Cards c/s due to TWI on EKG  ABX/culture: Vanc 9/14 -  Cefepime 9/14 -  Blood Cx 9/14 - NGTD  Assessment and Plan:  Rachel DORNERis a 53y.o. female who presented on 9/15 with 3 days of hypoxia (60-70% on 5-6L of home O2), productive cough, and subjective fevers as well as recent increased orthopnea, PND, and LE edema concerning for HCAP, CHF exacerbation and/or worsening of chronic pulmonary HTN and sarcoidosis. PMH is significant for pulmonary HTN, pulmonary sarcoidosis, cardiomyopathy, CHF, HTN, SVT, major depression with psychotic features, and GERD.   Hypoxia secondary to possible HCAP vs. CHF exacerbation vs. progression of chronic pulmonary HTN and sarcoidosis: Patient presented with a 3 day history of hypoxia (60-70% on home 5-6L O2), productive cough, and subjective fevers as well as recent increased orthopnea, PND, and LE edema. CT angio (9/14) did not show evidence of pulmonary embolism. CT chest (9/14) showed chronic honeycombing/fibrosis with slight progression compared to prior exam and a possible superimposed infectious process over left midlung. On admission, WBC 11.3, proBNP 1779. I-stat arterial blood gas 7.394, pC02 51.5, pO2 64, bicarb 31.6 consistent with chronic respiratory acidosis with appropriate metabolic compensation. Hypoxia likely multifactorial in context of possible HCAP, CHF exacerbation, and progression of chronic disease.  -- Keep O2 sats >90%; weaned non-rebreather from 15L/min --> 10L/min on 9/16 -- Vancomycin and cefepime (9/14 - ), per pharmacy recommendations for HCAP  (given recent hospitalization)  -- Lasix 42mIV qd (9/14-9/16) --> Lasix 4024mO BID per cards  -- Strict I/Os (net out 400), daily weights (no change)  -- Prednisone 26m81mpt agreed to 26mg27m of psychosis with steroid use; DuoNebs q4h; home symbicort  -- Blood cultures x 2 --> NGTD -- Echo (9/15): EF 55-60%, moderately dilated RV, and mildly reduced systolic function  Chest pain: Reports 3 days of chest tightness without radiation to jaw/arm, pressure that is reproducible on exam. Most likely costochondritis in setting of coughing. Evidence of TWI on 9/15 EKG but troponin x3 negative and pain reproducible on exam.  -- Consulted cardiology, appreciate recommendations  -- EKG (9/14) shows frequent PVCs, prolonged QT interval (QTc 526), TWI V1-V2 (not present 11/2013)  -- EKG (9/15) shows occasional PVCs, prolonged QT (Qtc 470), TWI in V1-V3  -- Troponin x 3 negative   Pulmonary HTN, right-sided CHF: Most recent echo in 4/15 showed mild LVH, EF 60%, mild RA dilatation. Followed by Drs. McClean and Byrum. Echo (9/15) minimally changed with EF 55-60%, moderately dilated RV, and mildly reduced systolic function. 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 Followed by Dr. ByrumLamonte Sakai Called Dr. ByrumLamonte Sakairecommended assessing need for oximizer before discharge, and agreed with plan to treat with 5 day course of prednisone 26mg 79mdischarge home on 10mg  9mrednisone 26mg (956m- 9/18) -- Continue home symbicort. DuoNebs as above   Normocytic anemia: Hb 10.7 on admission with MCV of 89.7. Most likely secondary to anemia of chronic disease. Downtrended to 9.9 on 9/15. Baseline between 10-11.  -- Continue to monitor  Possible UTI: Patient reports suprapubic pain, dysuria. UA (gathered after antibiotics); with ketones and protein -- Expect vancomycin/cefepime to cover possible urinary pathogen  HTN: Intermittent  hypotension.  -- Continue home metoprolol  -- Hold home Lasix  Hx of major depression with psychotic features: Reports that she also experiences psychosis on high-dose steroids. Agreed to try 76m prednisone upon admission.  -- Continue home paroxetine, abilify   Hx of GERD:  -- Continue home protonix 421mdaily  FEN/GI: SLIV, heart healthy diet  Prophylaxis: Lovenox   Disposition: Telemetry   Subjective: Reports dyspnea improving, on 10L/min of non re-breather. Coughing up yellow sputum (different from her baseline) with some chills. Still has suprapubic pressure and some dysuria. Had a BM yesterday with improvement in abdominal pain. Continues to have intermittent, sharp chest pain associated with SOB that is reproducible when pressing on her chest. No mental changes noted in setting of prednisone use.   Objective: Temp:  [97.4 F (36.3 C)-98.4 F (36.9 C)] 98.4 F (36.9 C) (09/16 0730) Pulse Rate:  [59-100] 63 (09/16 0730) Resp:  [17-25] 19 (09/16 0730) BP: (89-130)/(37-72) 119/57 mmHg (09/16 0730) SpO2:  [86 %-100 %] 98 % (09/16 0730) FiO2 (%):  [40 %-45 %] 45 % (09/16 0730) Weight:  [161 lb 2.5 oz (73.1 kg)] 161 lb 2.5 oz (73.1 kg) (09/16 0500)  Physical Exam: General: Pleasant woman with non re-breather in place, NAD  HEENT: EOMI. PERRLA.  Cardiovascular: RRR normal s1s2, no m/r/g. Reproducible pain upon palpating sterum Respiratory: Crackles throughout, most prominent at bases. Slightly improved from admission.  Abdomen: +bs. Soft, nondistended. Tenderness to palpation of suprapubic region.  Extremities: Trace edema of bilateral lower extremities through the mid-calf Skin: No lesions appreciated.  Neuro: Alert and oriented x 3. 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 No evidence of pulmonary embolism. Moderate to severe upper lobe predominant interstitial disease in the form of honeycombing/fibrosis compatible with known sarcoidosis with slight progression compared to the prior exam. Mild patchy ground-glass attenuation over the mid to lower lungs slightly more prominent compared to the previous exam. Stable small left effusion with associated basilar atelectasis. Slight mixed interstitial airspace density over the left midlung as cannot exclude a superimposed infectious process. Mild mediastinal/right hilar adenopathy unchanged.   Dg Chest Portable 1 View  12/20/2013 IMPRESSION: No active cardiopulmonary disease. 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    ChVerlin DikeMed Student 12/22/2013, 9:16 AM Acting Intern, [facility] pager: [phone] pages welcome  I have seen and evaluated the above patient with student doctor Cronk.   Physical Exam: General: chronically ill appearing, on Nonbreather, NAD.  Cardiovascular: RRR. No m/r/g. Respiratory: Crackles throughout all lung fields.  Extremities: Trace edema in the LE.  JaWasatchGY-3

## 2013-12-22 NOTE — Progress Notes (Signed)
Patient is currently active with [facility] for chronic disease management services.  Patient has been engaged by a [employer].  Patient will receive a post discharge transition of care call and will be evaluated for monthly home visits for assessments and disease process education.  Made Inpatient Case Manager aware that [facility] following. Of note, [facility] Care Management services does not replace or interfere with any services that are arranged by inpatient case management or social work.  For additional questions or referrals please contact Corliss Blacker BSN RN [facility]iaison at [phone].

## 2013-12-22 NOTE — Progress Notes (Signed)
CARE MANAGEMENT NOTE 12/22/2013  Patient:  TOLA, MEAS   Account Number:  [id number]  Date Initiated:  12/21/2013  Documentation initiated by:  Syrianna Schillaci  Subjective/Objective Assessment:   53 y.o. female presenting with a 3 day history of hypoxia (60-70% on 5-6L of home O2), productive cough, and subjective fevers concerning for HCAP, CHF exacerbation and/or worsening of chronic pulmonary HTN and sarcoidosis.     Action/Plan:   return to home with o2 and hhc/PT and RN [facility] notified of admission   Anticipated DC Date:  12/24/2013   Anticipated DC Plan:  [place]  In-house referral  NA      DC Planning Services  CM consult      [facility] Choice  Resumption Of Svcs/PTA Provider   Choice offered to / List presented to:  NA   DME arranged  OTHER - SEE COMMENT      DME agency  [facility].   Status of service:  In process, will continue to follow Medicare Important Message given?   (If response is "NO", the following Medicare IM given date fields will be blank) Date Medicare IM given:   Medicare IM given by:   Date Additional Medicare IM given:   Additional Medicare IM given by:    Discharge Disposition:    Per UR Regulation:  Reviewed for med. necessity/level of care/duration of stay  If discussed at [place] of Stay Meetings, dates discussed:    Comments:  09/162015/Alisson Rozell,RN,BSN,CCM: tct-DME rep with [facility]/made aware of need for oxymizer/will deliver to mc-2h20c.  [id number] Desere Gwin,RN,BSN,CCM:

## 2013-12-22 NOTE — Progress Notes (Addendum)
Patient ID: Rachel [name], female   DOB: [birth year], 53 y.o.   MRN: [id number]   SUBJECTIVE:53 yo with history of stage IV pulmonary sarcoidosis on home O2, pulmonary hypertension, and RV failure. She is now on Revatio 80 mg tid and and Tyvaso. She has been evaluated for heart/lung transplant at [facility] as an inpatient but needed to raise around $10,000 to continue evaluation and has decided against this for now. She has been seeing Dr. Lamonte Sakai for her sarcoid.   She has been admitted multiple times in the past 6 months for hypoxia and SOB. Last admission was 8/20-8/24/15. CXR showed severe interstitial lung disease and she was started on IV diuretics, prednisone and abx. Her symptoms improved and she was sent home on a higher dose of PO lasix.  Prior to admission [facility] nurse called concerning weight gain and was instructed to increase lasix to 40 mg BID for 3 days. SOB improved but then increased and O2 sats dropped into the upper 60-70s and told to come to ED.   Last discharge weight was 162 lbs and on admission was 157 lbs. Pertinent labs on admission were pro-BNP 1779, K+ 4.0, creatinine 0.71, WBC 11.3 and negative Tropoinin. Blood cx pending. CT chest (9/14) showed chronic honeycombing/fibrosis with slight progression compared to prior exam and a possible superimposed infectious process over left midlung.   Started on IV lasix, 24 hr I/O -400 cc. Prednisone increased to 20 mg after patient agreeing to has had previous issues with psychosis on higher doses.   Echo (9/15) with EF 55-60%, moderately dilated RV with mildly decreased systolic function, PASP 54 mmHg.   Feeling a bit better this morning on antibiotics and steroids.   Scheduled Meds: . antiseptic oral rinse  7 mL Mouth Rinse BID  . ARIPiprazole  2 mg Oral QODAY  . aspirin EC  81 mg Oral Daily  . budesonide-formoterol  1 puff Inhalation BID  . ceFEPime (MAXIPIME) IV  1 g Intravenous 3 times per day  . enoxaparin (LOVENOX) injection  40 mg  Subcutaneous Q24H  . furosemide  40 mg Oral BID  . ipratropium-albuterol  3 mL Nebulization Q4H  . metoprolol tartrate  25 mg Oral BID  . pantoprazole  40 mg Oral Daily  . PARoxetine  20 mg Oral Daily  . predniSONE  20 mg Oral Q breakfast  . sildenafil  80 mg Oral TID  . sodium chloride  3 mL Intravenous Q12H  . sodium chloride  3 mL Intravenous Q12H  . Treprostinil  18 mcg Inhalation QID  . vancomycin  1,000 mg Intravenous Q8H   Continuous Infusions:  PRN Meds:.acetaminophen, acetaminophen, albuterol, docusate sodium, ondansetron (ZOFRAN) IV, ondansetron, sodium chloride    Filed Vitals:   12/22/13 0400 12/22/13 0500 12/22/13 0548 12/22/13 0600  BP: 115/64 101/57  130/62  Pulse: 67 65  70  Temp: 98.1 F (36.7 C)     TempSrc: Oral     Resp: _0 Height:      Weight:  161 lb 2.5 oz (73.1 kg)    SpO2: 93% 100% 96% 100%    Intake/Output Summary (Last 24 hours) at 12/22/13 0736 Last data filed at 12/21/13 2345  Gross per 24 hour  Intake    743 ml  Output    500 ml  Net    243 ml    LABS: Basic Metabolic Panel:  Recent Labs  12/20/13 1143 12/21/13 0138  NA 143 138  K 4.0 4.3  CL 101 99  CO2 29 28  GLUCOSE 93 138*  BUN 14 16  CREATININE 0.71 0.65  CALCIUM 9.5 9.0   Liver Function Tests: No results found for this basename: AST, ALT, ALKPHOS, BILITOT, PROT, ALBUMIN,  in the last 72 hours No results found for this basename: LIPASE, AMYLASE,  in the last 72 hours CBC:  Recent Labs  12/20/13 1143 12/21/13 0138 12/22/13 0245  WBC 11.3* 6.3 13.4*  NEUTROABS 8.5*  --   --   HGB 10.7* 9.9* 10.0*  HCT 34.1* 31.1* 33.0*  MCV 89.7 88.9 93.0  PLT 242 202 228   Cardiac Enzymes:  Recent Labs  12/20/13 1901 12/21/13 0138 12/21/13 0815  TROPONINI <0.30 <0.30 <0.30   BNP: No components found with this basename: POCBNP,  D-Dimer: No results found for this basename: DDIMER,  in the last 72 hours Hemoglobin A1C: No results found for this basename:  HGBA1C,  in the last 72 hours Fasting Lipid Panel: No results found for this basename: CHOL, HDL, LDLCALC, TRIG, CHOLHDL, LDLDIRECT,  in the last 72 hours Thyroid Function Tests: No results found for this basename: TSH, T4TOTAL, FREET3, T3FREE, THYROIDAB,  in the last 72 hours Anemia Panel: No results found for this basename: VITAMINB12, FOLATE, FERRITIN, TIBC, IRON, RETICCTPCT,  in the last 72 hours  RADIOLOGY: Ct Angio Chest W/cm &/or Wo Cm  12/20/2013   CLINICAL DATA:  Shortness of breath 3 days in patient with known sarcoidosis and CHF.  EXAM: CT ANGIOGRAPHY CHEST WITH CONTRAST  TECHNIQUE: Multidetector CT imaging of the chest was performed using the standard protocol during bolus administration of intravenous contrast. Multiplanar CT image reconstructions and MIPs were obtained to evaluate the vascular anatomy.  CONTRAST:  50 mL Omnipaque 350 IV.  COMPARISON:  09/10/2008  FINDINGS: Left-sided pacemaker is present. Lungs are adequately inflated with continued moderate to severe upper lobe predominant interstitial disease with cystic change/fibrosis without significant change. There are patchy areas of ground-glass attenuation over the mid to lower lungs slightly more pronounced. Mild mixed interstitial airspace density over the left midlung. Small left pleural effusion with associated basilar atelectasis without significant change.  There is prominence of the main and proximal pulmonary arteries bilaterally. There is chronic obstruction of the right upper lobe pulmonary artery. There is no evidence pulmonary embolism. Mild mesenteric and right hilar adenopathy is unchanged. Remaining mediastinal structures are unchanged.  Images through the upper abdomen are unremarkable. Remainder the exam is unchanged.  Review of the MIP images confirms the above findings.  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 No evidence of pulmonary embolism.  Moderate to severe upper lobe predominant  interstitial disease in the form of honeycombing/fibrosis compatible with known sarcoidosis with slight progression compared to the prior exam. Mild patchy ground-glass attenuation over the mid to lower lungs slightly more prominent compared to the previous exam. Stable small left effusion with associated basilar atelectasis. Slight mixed interstitial airspace density over the left midlung as cannot exclude a superimposed infectious process.  Mild mediastinal/right hilar adenopathy unchanged.   Electronically Signed   By: Marin Olp M.D.   On: 12/20/2013 14:32   Dg Chest Portable 1 View  12/20/2013   CLINICAL DATA:  Shortness of breath for 3 days  EXAM: PORTABLE CHEST - 1 VIEW  COMPARISON:  January 25, 2014  FINDINGS: The heart size and mediastinal contours are stable. The heart size is enlarged. Cardiac pacemaker is unchanged. There is marked diffuse fibrosis of both lungs unchanged compared  to prior exam. There is no pleural effusion. The visualized skeletal structures are stable.  IMPRESSION: No active cardiopulmonary disease. 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   Electronically Signed   By: Abelardo Diesel M.D.   On: 12/20/2013 12:33   PHYSICAL EXAM General: NAD Neck: No JVD, no thyromegaly or thyroid nodule.  Lungs: Crackles/coarse BS bilaterally. CV: Nondisplaced PMI.  Heart regular S1/S2, no S3/S4, no murmur.  No peripheral edema.  No carotid bruit.  Normal pedal pulses.  Abdomen: Soft, nontender, no hepatosplenomegaly, no distention.  Neurologic: Alert and oriented x 3.  Psych: Normal affect. Extremities: No clubbing or cyanosis.   TELEMETRY: Reviewed telemetry pt in NSR  Assessment:    1) Acute on chronic respiratory failure  2) Pulmonary arterial HTN with RV dysfunction: Echo (9/15) with EF 55-60%, moderately dilated RV with mildly decreased systolic function, PASP 54 mmHg.  3) HCAP  4) Sarcoidosis  5) Anemia on chronic disease   Plan/Discussion:    She  has recurrent respiratory failure. I suspect this is most likely infectious based on symptoms and fact that weight is actually down more from recent discharge. She does not appear volume overloaded on exam and I will have her take Lasix 40 mg po bid for now.    Continue Revatio and Tyvaso.   I think that she would benefit from pulmonary evaluation and especially close pulmonary followup after discharge.   We will follow as needed, not much additional to offer at this time.   [name] 12/22/2013

## 2013-12-22 NOTE — Progress Notes (Signed)
FMTS ATTENDING  NOTE Bernardino Dowell,MD I  have seen and examined this patient, reviewed their chart. I have discussed this patient with the resident. I agree with the resident's findings, assessment and care plan. 

## 2013-12-22 NOTE — Progress Notes (Signed)
Oxymizer delivered to pt's bedside. Placed with belongings for use on discharge.

## 2013-12-23 LAB — BASIC METABOLIC PANEL
Anion gap: 12 (ref 5–15)
BUN: 14 mg/dL (ref 6–23)
CALCIUM: 8.9 mg/dL (ref 8.4–10.5)
CO2: 32 mEq/L (ref 19–32)
Chloride: 96 mEq/L (ref 96–112)
Creatinine, Ser: 0.75 mg/dL (ref 0.50–1.10)
GFR calc Af Amer: >90 mL/min (ref >90)
Glucose, Bld: 96 mg/dL (ref 70–99)
Potassium: 3.7 mEq/L (ref 3.7–5.3)
SODIUM: 140 meq/L (ref 137–147)

## 2013-12-23 LAB — CBC
HCT: 32.1 % — ABNORMAL LOW (ref 36.0–46.0)
Hemoglobin: 9.9 g/dL — ABNORMAL LOW (ref 12.0–15.0)
MCH: 28 pg (ref 26.0–34.0)
MCHC: 30.8 g/dL (ref 30.0–36.0)
MCV: 90.7 fL (ref 78.0–100.0)
Platelets: 264 10*3/uL (ref 150–400)
RBC: 3.54 MIL/uL — ABNORMAL LOW (ref 3.87–5.11)
RDW: 14.1 % (ref 11.5–15.5)
WBC: 9.9 10*3/uL (ref 4.0–10.5)

## 2013-12-23 MED ORDER — METOPROLOL TARTRATE 12.5 MG HALF TABLET
12.5000 mg | ORAL_TABLET | Freq: Two times a day (BID) | ORAL | Status: DC
  Administered 2013-12-24 (×2): 12.5 mg via ORAL
  Filled 2013-12-23 (×5): qty 1

## 2013-12-23 NOTE — Progress Notes (Signed)
ANTIBIOTIC CONSULT NOTE - FOLLOW UP  Pharmacy Consult for vancomycin/cefepime Indication: possible HCAP vs CHF exacerbation  Allergies  Allergen Reactions  . Latex Rash  . Nitroglycerin Other (See Comments)    Patient is on Revatio  . Other Other (See Comments)    High Dose Steroids cause chemical imbalance and altered mental status  . Meperidine Hcl Other (See Comments)    REACTION: Makes her feel funny  . Tramadol Hcl Other (See Comments)    REACTION: nausea, lightheadedness, sleepiness, and dizziness  . Ace Inhibitors Other (See Comments)    unknown  . Morphine Other (See Comments)    Loopy, light headed feeling    . Propoxyphene N-Acetaminophen Rash    Patient Measurements: Height: _0  (160 cm) Weight: 163 lb 3.2 oz (74.027 kg) IBW/kg (Calculated) : 52.4  Vital Signs: Temp: 97.9 F (36.6 C) (09/17 1637) Temp src: Oral (09/17 1637) BP: 88/48 mmHg (09/17 1637) Pulse Rate: 63 (09/17 1637) Intake/Output from previous day: 09/16 0701 - 09/17 0700 In: 1883 [P.O.:930; I.V.:3; IV Piggyback:950] Out: 975 [Urine:975] Intake/Output from this shift: Total I/O In: 993 [P.O.:540; I.V.:3; IV Piggyback:450] Out: 975 [Urine:975]  Labs:  Recent Labs  12/21/13 0138 12/22/13 0245 12/23/13 0230  WBC 6.3 13.4* 9.9  HGB 9.9* 10.0* 9.9*  PLT 202 228 264  CREATININE 0.65  --  0.75   Estimated Creatinine Clearance: 78.3 ml/min (by C-G formula based on Cr of 0.75). No results found for this basename: VANCOTROUGH, VANCOPEAK, VANCORANDOM, [place], GENTPEAK, GENTRANDOM, [place], TOBRAPEAK, TOBRARND, AMIKACINPEAK, AMIKACINTROU, AMIKACIN,  in the last 72 hours   Microbiology: Recent Results (from the past 720 hour(s))  CULTURE, BLOOD (ROUTINE X 2)     Status: None   Collection Time    11/25/13  5:45 PM      Result Value Ref Range Status   Specimen Description BLOOD ARM RIGHT   Final   Special Requests BOTTLES DRAWN AEROBIC AND ANAEROBIC 10CC   Final   Culture  Setup  Time     Final   Value: 11/25/2013 22:39     Performed at [facility]   Culture     Final   Value: NO GROWTH 5 DAYS     Performed at [facility]   Report Status 12/01/2013 FINAL   Final  CULTURE, BLOOD (ROUTINE X 2)     Status: None   Collection Time    11/25/13  6:00 PM      Result Value Ref Range Status   Specimen Description BLOOD ARM LEFT   Final   Special Requests BOTTLES DRAWN AEROBIC AND ANAEROBIC 10CC   Final   Culture  Setup Time     Final   Value: 11/25/2013 22:41     Performed at [facility]   Culture     Final   Value: NO GROWTH 5 DAYS     Performed at [facility]   Report Status 12/01/2013 FINAL   Final  CLOSTRIDIUM DIFFICILE BY PCR     Status: None   Collection Time    11/26/13  7:19 AM      Result Value Ref Range Status   C difficile by pcr NEGATIVE  NEGATIVE Final  URINE CULTURE     Status: None   Collection Time    11/29/13 11:00 AM      Result Value Ref Range Status   Specimen Description URINE, RANDOM   Final   Special Requests NONE   Final  Culture  Setup Time     Final   Value: 11/29/2013 11:42     Performed at [facility]     Final   Value: NO GROWTH     Performed at [facility]   Culture     Final   Value: NO GROWTH     Performed at [facility]   Report Status 11/30/2013 FINAL   Final  CULTURE, BLOOD (ROUTINE X 2)     Status: None   Collection Time    12/20/13  3:03 PM      Result Value Ref Range Status   Specimen Description BLOOD ARM RIGHT   Final   Special Requests BOTTLES DRAWN AEROBIC AND ANAEROBIC 5CC   Final   Culture  Setup Time     Final   Value: 12/20/2013 20:20     Performed at [facility]   Culture     Final   Value:        BLOOD CULTURE RECEIVED NO GROWTH TO DATE CULTURE WILL BE HELD FOR 5 DAYS BEFORE ISSUING A FINAL NEGATIVE REPORT     Performed at [facility]   Report Status PENDING   Incomplete  CULTURE, BLOOD (ROUTINE X 2)      Status: None   Collection Time    12/20/13  3:10 PM      Result Value Ref Range Status   Specimen Description BLOOD HAND RIGHT   Final   Special Requests BOTTLES DRAWN AEROBIC AND ANAEROBIC 5CC   Final   Culture  Setup Time     Final   Value: 12/20/2013 20:22     Performed at [facility]   Culture     Final   Value:        BLOOD CULTURE RECEIVED NO GROWTH TO DATE CULTURE WILL BE HELD FOR 5 DAYS BEFORE ISSUING A FINAL NEGATIVE REPORT     Performed at [facility]   Report Status PENDING   Incomplete  MRSA PCR SCREENING     Status: None   Collection Time    12/20/13  5:21 PM      Result Value Ref Range Status   MRSA by PCR NEGATIVE  NEGATIVE Final   Comment:            The GeneXpert MRSA Assay (FDA     approved for NASAL specimens     only), is one component of a     comprehensive MRSA colonization     surveillance program. It is not     intended to diagnose MRSA     infection nor to guide or     monitor treatment for     MRSA infections.    Anti-infectives   Start     Dose/Rate Route Frequency Ordered Stop   12/21/13 0000  vancomycin (VANCOCIN) IVPB 1000 mg/200 mL premix     1,000 mg 200 mL/hr over 60 Minutes Intravenous Every 8 hours 12/20/13 1633     12/20/13 2300  ceFEPIme (MAXIPIME) 1 g in dextrose 5 % 50 mL IVPB     1 g 100 mL/hr over 30 Minutes Intravenous 3 times per day 12/20/13 1633     12/20/13 1500  vancomycin (VANCOCIN) IVPB 1000 mg/200 mL premix     1,000 mg 200 mL/hr over 60 Minutes Intravenous STAT 12/20/13 1447 12/20/13 1734   12/20/13 1500  ceFEPIme (MAXIPIME) 1 g in dextrose 5 % 50 mL  IVPB     1 g 100 mL/hr over 30 Minutes Intravenous STAT 12/20/13 1447 12/20/13 1634      Assessment: 53 yo f presenting on 9/14 for SOB.  Patient was recently hospitalized for hypoxia and discharged 11/29/13; patient received vancomycin and ceftazidime from 8/21-8/23 during that admission.  Patient has been on vancomycin and cefepime since 9/14.  Cultures  remain negative.  Per MD note, patient is to transition to oral antibiotics today (possibly tomorrow).  Wbc wnl, tmax 99.1, SCr stable (0.75), CrCl ~78 ml/min.  9/14 Bld x2: NGTD 9/14 UCx: no result 9/14 MRSA PCR (-)  Vanc 914 >> Cefepime 9/14 >>  Goal of Therapy:  Vancomycin trough level 15-20 mcg/ml Eradication of possible infection  Plan:  Continue vancomycin 1,000 mg IV q8hr Continue cefepime 1,000 mg IV q8hr Follow-up transition to oral antibiotics/narrowing Monitor wbc, temperature curve, renal fx, clinical course  Tyliyah Mcmeekin L. [name], PharmD Clinical Pharmacy Resident Pager: [phone] 12/23/2013 5:20 PM

## 2013-12-23 NOTE — Progress Notes (Signed)
Family Medicine Teaching Service Daily Progress Note Intern Pager: [phone]  Patient name: Rachel [name] Medical record number: [id number] Date of birth: [birth year] Age: 53 y.o. Gender: female  Primary Care Provider: Christa See, MD Consultants: Cardiology Code Status: Full   Pt Overview and Major Events to Date:  9/14: admitted for dyspnea, prednisone 52m started, HCAP coverate  9/15: Cards c/s due to TWI on EKG 9/16: Weaned to 10L/m non re-breather  ABX/culture:  Vanc 9/14 -  Cefepime 9/14 -  Blood Cx 9/14 - NGTD   Assessment and Plan:  Rachel STOCKDALEis a 53y.o. female who presented on 9/15 with 3 days of hypoxia (60-70% on 5-6L of home O2), productive cough, and subjective fevers as well as recent increased orthopnea, PND, and LE edema concerning for HCAP, CHF exacerbation and/or worsening of chronic pulmonary HTN and sarcoidosis. PMH is significant for pulmonary HTN, pulmonary sarcoidosis, cardiomyopathy, CHF, HTN, SVT, major depression with psychotic features, and GERD.   Hypoxia secondary to possible HCAP vs. CHF exacerbation vs. progression of chronic pulmonary HTN and sarcoidosis: Patient presented with a 3 day history of hypoxia (60-70% on home 5-6L O2), productive cough, and subjective fevers as well as recent increased orthopnea, PND, and LE edema. CT angio (9/14) did not show evidence of pulmonary embolism. CT chest (9/14) showed chronic honeycombing/fibrosis with slight progression compared to prior exam and a possible superimposed infectious process over left midlung. On admission, WBC 11.3, proBNP 1779. I-stat arterial blood gas 7.394, pC02 51.5, pO2 64, bicarb 31.6 consistent with chronic respiratory acidosis with appropriate metabolic compensation. Hypoxia likely multifactorial in context of possible HCAP (recent hospitalization), CHF exacerbation, and progression of chronic disease.  -- Keep O2 sats >90%; weaned to 6L [place] overnight on 9/17 however desatted to mid-80s  moving in bed --> back on 10L/min non re-breather in AM -- Vancomycin and cefepime (9/14 - ), plan to transition to oral antibiotics on 9/17 -- Lasix 438mPO BID  -- Strict I/Os (net +800), daily weights (up 6lbs)  -- Prednisone 20100m9/14 - ) for 5 days then transition to 49m92mily  -- DuoNebs q4h; home symbicort  -- Blood cultures x 2 --> NGTD  -- Echo (9/15) minimally changed from 07/2013: EF 55-60%, moderately dilated RV, and mildly reduced systolic function  Chest pain: Reports 3 days of chest tightness without radiation to jaw/arm, pressure that is reproducible on exam. Most likely costochondritis in setting of coughing. Evidence of TWI on 9/15 EKG but troponin x3 negative and pain reproducible on exam.  -- Consulted cardiology, appreciate recommendations  -- EKG (9/14) shows frequent PVCs, prolonged QT interval (QTc 526), TWI V1-V2 (not present 11/2013)  -- EKG (9/15) shows occasional PVCs, prolonged QT (Qtc 470), TWI in V1-V3  -- Troponin x 3 negative   Pulmonary HTN, right-sided CHF: Most recent echo in 4/15 showed mild LVH, EF 60%, mild RA dilatation. Followed by Drs. McClean and Byrum. Echo (9/15) minimally changed with EF 55-60%, moderately dilated RV, and mildly reduced systolic function.  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 Followed by Dr. ByruLamonte Sakai- Dr. ByruLamonte Sakaiommended assessing need for oximizer before discharge and agreed with plan to treat with 5 day course of prednisone 20mg30m discharge home on 49mg 60mOxymizer at bedside, RT can ambulate to titrate O2 needed prior to discharge  -- Prednisone 20mg (45m - 9/18)  -- Continue home  symbicort. DuoNebs as above   Normocytic anemia: Hb 10.7 on admission with MCV of 89.7. Most likely secondary to anemia of chronic disease. Baseline between 10-11.  -- Continue to monitor   Possible UTI: Patient reports suprapubic pain, dysuria. UA  (gathered after antibiotics); with ketones and protein -- Expect vancomycin/cefepime to cover possible urinary pathogen   HTN: Intermittent hypotension to the 100s-90s/50s-60s. -- Decreased metoprolol from 25 to 12.89m BID -- Continue home Lasix 488mPO BID  Hx of major depression with psychotic features: Reports that she also experiences psychosis on high-dose steroids. Agreed to try 2072mrednisone upon admission.  -- Continue home paroxetine, abilify   Hx of GERD:  -- Continue home protonix 88m77mily   FEN/GI: SLIV, heart healthy diet  Prophylaxis: Lovenox   Disposition: Telemetry   Subjective: Patient desaturated to the mid-80s on 6L [place] after moving around in bed requiring being placed on a non re-breather again. Coughing up less yellow sputum than previous days. Some chills. Still has dysuria but improving. Continues to have intermittent, sharp chest pain associated with SOB that is reproducible when pressing on her chest. No mental changes noted in setting of prednisone use. Oxymizer at patient's bedside.  Objective: Temp:  [97.5 F (36.4 C)-98.4 F (36.9 C)] 97.9 F (36.6 C) (09/17 0418) Pulse Rate:  [62-77] 64 (09/16 2026) Resp:  [14-26] 14 (09/16 2026) BP: (94-119)/(50-65) 98/65 mmHg (09/16 2026) SpO2:  [96 %-100 %] 100 % (09/17 0418) FiO2 (%):  [40 %-45 %] 40 % (09/16 2018) Weight:  [163 lb 3.2 oz (74.027 kg)] 163 lb 3.2 oz (74.027 kg) (09/17 0500)  Physical Exam: General: Pleasant woman with non re-breather in place, taking deep breaths after recently desaturating to the mid-80s HEENT: EOMI. PERRLA.  Cardiovascular: RRR normal s1s2, no m/r/g. Reproducible pain upon palpating sterum  Respiratory: Crackles throughout, most prominent at bases. Improving since admission Abdomen: +bs. Soft, nondistended. Tenderness to palpation of suprapubic region.  Extremities: No edema of LE Skin: No lesions appreciated.  Neuro: Alert and oriented x 3. 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 No evidence of pulmonary embolism. Moderate to severe upper lobe predominant interstitial disease in the form of honeycombing/fibrosis compatible with known sarcoidosis with slight progression compared to the prior exam. Mild patchy ground-glass attenuation over the mid to lower lungs slightly more prominent compared to the previous exam. Stable small left effusion with associated basilar atelectasis. Slight mixed interstitial airspace density over the left midlung as cannot exclude a superimposed infectious process. Mild mediastinal/right hilar adenopathy unchanged.   Dg Chest Portable 1 View  12/20/2013 IMPRESSION: No active cardiopulmonary disease. 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    ChriVerlin Diked Student 12/23/2013, 7:19 AM Acting Intern, [facility] pager: [phone] pages welcome  I have seen and evaluated the above patient with student doctor Cronk.  I agree with the note.  Physical Exam:  General: chronically ill appearing, on [place] O2.  Cardiovascular: RRR. No m/r/g.  Respiratory: Crackles throughout all lung fields.  Extremities: Trace edema in the LE. Neuro: No focal deficits.  [place] PGY-3 Pager #: [phone]

## 2013-12-23 NOTE — Progress Notes (Signed)
FMTS ATTENDING  NOTE Rachel Eniola,MD I  have seen and examined this patient, reviewed their chart. I have discussed this patient with the resident. I agree with the resident's findings, assessment and care plan.

## 2013-12-24 LAB — CBC
HCT: 31.8 % — ABNORMAL LOW (ref 36.0–46.0)
Hemoglobin: 9.8 g/dL — ABNORMAL LOW (ref 12.0–15.0)
MCH: 27.8 pg (ref 26.0–34.0)
MCHC: 30.8 g/dL (ref 30.0–36.0)
MCV: 90.1 fL (ref 78.0–100.0)
PLATELETS: 264 10*3/uL (ref 150–400)
RBC: 3.53 MIL/uL — ABNORMAL LOW (ref 3.87–5.11)
RDW: 13.9 % (ref 11.5–15.5)
WBC: 10.6 10*3/uL — AB (ref 4.0–10.5)

## 2013-12-24 MED ORDER — LEVOFLOXACIN 750 MG PO TABS
750.0000 mg | ORAL_TABLET | Freq: Every day | ORAL | Status: AC
  Administered 2013-12-24 – 2013-12-25 (×2): 750 mg via ORAL
  Filled 2013-12-24 (×3): qty 1

## 2013-12-24 MED ORDER — IPRATROPIUM-ALBUTEROL 0.5-2.5 (3) MG/3ML IN SOLN
3.0000 mL | Freq: Four times a day (QID) | RESPIRATORY_TRACT | Status: DC
  Administered 2013-12-25 (×3): 3 mL via RESPIRATORY_TRACT
  Filled 2013-12-24 (×3): qty 3

## 2013-12-24 NOTE — Progress Notes (Signed)
Family Medicine Teaching Service Daily Progress Note Intern Pager: [phone]  Patient name: Rachel [name] Medical record number: [id number] Date of birth: Mar 03, 1961 Age: 53 y.o. Gender: female  Primary Care Provider: Christa See, MD Consultants: Cardiology Code Status: Full   Pt Overview and Major Events to Date:  9/14: Admitted for dyspnea, prednisone 1m started, HCAP coverage  9/15: Cards c/s due to TWI on EKG  9/16: Weaned to 10L/m non re-breather  9/17: Weaned to 6L [place];  9/18: Transitioned to levaquin  ABX/culture:  Vanc 9/14 - 9/18  Cefepime 9/14 - 9/18 Levaquin 9/18 -    Blood Cx 9/14 - NGTD   Assessment and Plan:  Rachel MCCHESNEYis a 53y.o. female who presented on 9/14 with 3 days of hypoxia (60-70% on 5-6L of home O2), productive cough, and subjective fevers as well as recent increased orthopnea, PND, and LE edema concerning for HCAP, CHF exacerbation and/or worsening of chronic pulmonary HTN and sarcoidosis. PMH is significant for pulmonary HTN, pulmonary sarcoidosis, cardiomyopathy, CHF, HTN, SVT, major depression with psychotic features, and GERD.   Hypoxia secondary to possible HCAP vs. CHF exacerbation vs. progression of chronic pulmonary HTN and sarcoidosis: Patient presented with a 3 day history of hypoxia (60-70% on home 5-6L O2), productive cough, and subjective fevers as well as recent increased orthopnea, PND, and LE edema. CT angio (9/14) did not show evidence of pulmonary embolism. CT chest (9/14) showed chronic honeycombing/fibrosis with slight progression compared to prior exam and a possible superimposed infectious process over left midlung. On admission, WBC 11.3, proBNP 1779. I-stat arterial blood gas 7.394, pC02 51.5, pO2 64, bicarb 31.6 consistent with chronic respiratory acidosis with appropriate metabolic compensation. Hypoxia likely multifactorial in context of possible HCAP (recent hospitalization), CHF exacerbation, and progression of chronic disease.   -- Keep O2 sats >90%; weaned to 6L [place]  -- Vancomycin and Cefepime (9/15-9/18) --> Levaquin (9/18- ) for a 7 day total course  -- Lasix 474mPO BID  -- Strict I/Os (net +442), daily weights (up 7lbs)  -- Prednisone 2024m9/15-9/19) for 5 days then transition to 57m59mily  -- DuoNebs q4h; home symbicort  -- Blood cultures x 2 --> NGTD  -- Echo (9/15) minimally changed from 07/2013: EF 55-60%, moderately dilated RV, and mildly reduced systolic function   Chest pain: resolved Reports 3 days of chest tightness without radiation to jaw/arm, pressure that is reproducible on exam. Most likely costochondritis in setting of coughing. Evidence of TWI on 9/15 EKG but troponin x3 negative and pain reproducible on exam.  -- Consulted cardiology, no intervention pursued  -- EKG (9/14) shows frequent PVCs, prolonged QT interval (QTc 526), TWI V1-V2 (not present 11/2013)  -- EKG (9/15) shows occasional PVCs, prolonged QT (Qtc 470), TWI in V1-V3  -- Troponin x 3 negative   Pulmonary HTN, right-sided CHF: Most recent echo in 4/15 showed mild LVH, EF 60%, mild RA dilatation. Followed by Drs. McClean and Byrum. Echo (9/15) minimally changed with EF 55-60%, moderately dilated RV, and mildly reduced systolic function.  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 Followed by Dr. ByruLamonte Sakai- Dr. ByruLamonte Sakaiommended assessing need for oximizer before discharge and agreed with plan to treat with 5 day course of prednisone 20mg71m discharge home on 57mg 64mday 4/5 prednisone 20 mg -- Oxymizer at bedside, RT can ambulate to titrate O2 needed prior to discharge  --  Prednisone 36m (9/14 - 9/18)  -- Continue home symbicort. DuoNebs as above   Normocytic anemia: Hb 10.7 on admission with MCV of 89.7. Most likely secondary to anemia of chronic disease. Baseline between 10-11.  -- Continue to monitor   Possible UTI: Patient reports  suprapubic pain, dysuria. UA (gathered after antibiotics); with ketones and protein -- Expect vancomycin/cefepime to cover possible urinary pathogen   HTN: Intermittent hypotension to the 100s-90s/50s-60s.  -- Decreased metoprolol from 25 to 12.569mBID  -- Continue home Lasix 4050mO BID   Hx of major depression with psychotic features: Reports that she also experiences psychosis on high-dose steroids. Agreed to try 93m55mednisone upon admission.  -- Continue home paroxetine, abilify   Hx of GERD:  -- Continue home protonix 40mg106mly   FEN/GI: SLIV, heart healthy diet  Prophylaxis: Lovenox   Disposition: Consider discharge tomorrow if tolerates transition to Levaquin  Subjective: Feels like her dyspnea is improved. On 6L [place]. Coughing continues but no longer has subjective fevers. Still experiencing suprapubic pain and dysuria, but improving. Chest pain with coughing improved.   Objective: Temp:  [97.9 F (36.6 C)-99.1 F (37.3 C)] 97.9 F (36.6 C) (09/17 2300) Pulse Rate:  [60-75] 71 (09/18 0435) Resp:  [13-26] 13 (09/18 0435) BP: (87-111)/(47-74) 87/47 mmHg (09/18 0435) SpO2:  [92 %-100 %] 100 % (09/18 0435) Weight:  [164 lb 14.5 oz (74.8 kg)] 164 lb 14.5 oz (74.8 kg) (09/18 0500)  Physical Exam: General: Pleasant woman with [place] in place.  HEENT: EOMI. PERRLA.  Cardiovascular: RRR normal s1s2, no m/r/g.  Respiratory: Fine crackles throughout. Improved since admission. Abdomen: +bs. Soft, nondistended. Tenderness to palpation of suprapubic region.  Extremities: Trace pitting edema to the mid-calf. Skin: No lesions appreciated.  Neuro: Alert and oriented x 3. 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 No evidence of pulmonary embolism. Moderate to severe upper lobe predominant interstitial disease in the form of honeycombing/fibrosis compatible with known sarcoidosis with slight progression compared to the prior exam. Mild patchy ground-glass attenuation over the mid to lower lungs slightly more prominent compared to the previous exam. Stable small left effusion with associated basilar atelectasis. Slight mixed interstitial airspace density over the left midlung as cannot exclude a superimposed infectious process. Mild mediastinal/right hilar adenopathy unchanged.   Dg Chest Portable 1 View  12/20/2013 IMPRESSION: No active cardiopulmonary disease. 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   ChrisVerlin Dike Student 12/24/2013, 7:20 AM Acting Intern, [facility] pager: [phone] pages welcome  Upper Level Addendum:  I have seen and evaluated this patient along with MS Ms. CronkFoye Deerreviewed the above note, making necessary revisions in [facility] Exam: General: Pleasant woman with  in place.  Cardiovascular: RRR normal s1s2, no m/r/g.  Respiratory: Fine crackles throughout. Improved since admission.  Extremities: Trace pitting edema to  the mid-calf. Skin: No lesions appreciated.  Neuro: Alert. No gross abnormalities   Clearance Coots, MD Family Medicine PGY-2

## 2013-12-24 NOTE — Progress Notes (Signed)
FMTS ATTENDING  NOTE Kehinde Eniola,MD I  have seen and examined this patient, reviewed their chart. I have discussed this patient with the resident. I agree with the resident's findings, assessment and care plan.

## 2013-12-24 NOTE — Progress Notes (Signed)
Report called to [facility] at bedside

## 2013-12-24 NOTE — Progress Notes (Signed)
CARE MANAGEMENT NOTE 12/24/2013  Patient:  Rachel [name], Rachel [name]   Account Number:  [id number]  Date Initiated:  12/21/2013  Documentation initiated by:  DAVIS,RHONDA  Subjective/Objective Assessment:   53 y.o. female presenting with a 3 day history of hypoxia (60-70% on 5-6L of home O2), productive cough, and subjective fevers concerning for HCAP, CHF exacerbation and/or worsening of chronic pulmonary HTN and sarcoidosis.     Action/Plan:   return to home with o2 and hhc/PT and RN [facility] notified of admission   Anticipated DC Date:  12/27/2013   Anticipated DC Plan:  [place]  In-house referral  NA      DC Planning Services  CM consult      [facility] Choice  Resumption Of Svcs/PTA Provider   Choice offered to / List presented to:  NA   DME arranged  OTHER - SEE COMMENT      DME agency  [place].   Status of service:  In process, will continue to follow Medicare Important Message given?  YES (If response is "NO", the following Medicare IM given date fields will be blank) Date Medicare IM given:  12/24/2013 Medicare IM given by:  DAVIS,RHONDA Date Additional Medicare IM given:   Additional Medicare IM given by:    Discharge Disposition:    Per UR Regulation:  Reviewed for med. necessity/level of care/duration of stay  If discussed at [place] of Stay Meetings, dates discussed:    Comments:  [id number]/Rhonda Davis,RN,BSN,CCM: o2 down to 10l with the oximizer/iv abx being switched to p.o./poss dc to home with hhc if tolerated p.o. Levaquin.  [id number]/Rhonda Davis,RN,BSN,CCM: tct-DME rep with [facility]/made aware of need for oxymizer/will deliver to mc-2h20c.  [id number]/JHERDE Davis,RN,BSN,CCM:

## 2013-12-24 NOTE — Progress Notes (Signed)
Pt would like to walk with PT, RN aware and at bedside.  Will arrange to use oxymizer when PT available.

## 2013-12-24 NOTE — Progress Notes (Signed)
Patient states she did not want the IV back and that her MD says she could leave it out IV team notified very hard to stick

## 2013-12-25 LAB — CBC
HCT: 31.5 % — ABNORMAL LOW (ref 36.0–46.0)
Hemoglobin: 9.9 g/dL — ABNORMAL LOW (ref 12.0–15.0)
MCH: 28 pg (ref 26.0–34.0)
MCHC: 31.4 g/dL (ref 30.0–36.0)
MCV: 89.2 fL (ref 78.0–100.0)
Platelets: 283 10*3/uL (ref 150–400)
RBC: 3.53 MIL/uL — ABNORMAL LOW (ref 3.87–5.11)
RDW: 13.8 % (ref 11.5–15.5)
WBC: 10 10*3/uL (ref 4.0–10.5)

## 2013-12-25 MED ORDER — PREDNISONE 10 MG PO TABS: 10.0000 mg | ORAL_TABLET | Freq: Every day | ORAL | Status: DC

## 2013-12-25 MED ORDER — LEVOFLOXACIN 750 MG PO TABS
750.0000 mg | ORAL_TABLET | Freq: Every day | ORAL | Status: AC
Start: 2013-12-25 — End: 2013-12-30

## 2013-12-25 MED ORDER — METOPROLOL TARTRATE 12.5 MG HALF TABLET: 12.5000 mg | ORAL_TABLET | Freq: Two times a day (BID) | ORAL | Status: DC

## 2013-12-25 NOTE — Discharge Instructions (Signed)
Follow-up soon with Dr. Lamonte Sakai to talk about your recent admission.     Acute Respiratory Distress Syndrome  Acute respiratory distress syndrome (ARDS) is a serious, life-threatening lung condition that can cause breathing failure. It occurs in people who are critically ill or in people who have had a serious injury.  CAUSES  ARDS occurs when small blood vessels in the lungs leak fluid into the air sacs (alveoli) of the lungs. The fluid causes the lungs to become "stiff" and decreases their ability to inflate. The fluid also prevents oxygen from being absorbed into the bloodstream. When the bloodstream does not have enough oxygen, the body's vital organs do not get enough oxygen to function properly. ARDS can occur in the following conditions:  Sepsis. This is a serious bloodstream infection.  Serious injury (trauma) to the head or chest.  Pneumonia.  After major surgery, such as a lung transplant.  Drug overdose.  Breathing (inhalation) of harmful chemicals. SYMPTOMS  ARDS comes on quickly (rapid onset) and can occur within 24 to 48 hours of an infection, illness, surgery, or injury. Symptoms include:  Shortness of breath or difficulty breathing.  Cyanosis. This is a bluish color to the skin or nail beds (due to low oxygen levels in the blood).  Fast or irregular heart rate.  Low blood pressure (hypotension).  Organ failure. DIAGNOSIS  There is not a specific test to diagnose ARDS. It is usually diagnosed when other diseases and conditions that cause similar symptoms have been ruled out. When a person is thought to have ARDS, the following tests may be performed:  A chest X-ray or computed tomography (CT) scan to look at the lungs.  Arterial blood gas (ABG) analysis. This test looks at the oxygen level in the blood.  Blood tests to rule out infection.  Sputum culture to rule out a lung infection.  Bronchoscopy. TREATMENT  ARDS is a critical condition. People who develop  ARDS need to be in a hospital intensive care unit (ICU). Treatment of ARDS includes:  Providing oxygenation. This is a main treatment goal of ARDS. A breathing machine (ventilator) is often used to help a person breathe and to provide oxygen. When on a breathing machine, medicine is given to keep patients asleep (sedated).  Treatment of the underlying cause of ARDS (infection, illness, or trauma).  Supportive treatment such as:  Intravenous (IV) fluids.  Liquid nutrition that goes through an IV or feeding tube.  Blood pressure medicine to support low blood pressure.  Antibiotic medicine to help fight infection.  Steroid medicine to help decrease swelling (inflammation) in the lungs.  Diuretic medicine to get rid of extra fluid in the body. HOME CARE INSTRUCTIONS  After recovering from ARDS, you may have weakness, shortness of breath, or memory problems. You may also suffer from depression or from complications of the illness that caused ARDS. You can do several things to help your recovery:  Do not smoke.  If you drink alcohol, limit the amount of alcohol you drink to 1 or 2 drinks a day.  Be sure you get a yearly flu (influenza) shot. You should get a pneumonia vaccine once every 5 years.  Ask your caregiver about lung rehabilitation programs.  Ask your caregiver about local support groups for people with breathing problems.  Ask friends and family to help you if daily activities make you tired. SEEK MEDICAL CARE IF:   You become short of breath with activity or while at rest.  You develop a cough  that does not go away. SEEK IMMEDIATE MEDICAL CARE IF:   You have sudden shortness of breath with or without chest pain.  You have chest pain that does not go away.  You develop swelling or pain in one of your legs.  You have trauma to your chest or any other part of your body.  You have a fever.  You overdose or have a reaction to your medicine. MAKE SURE YOU:    Understand these instructions.  Will watch your condition.  Will get help right away if you are not doing well or get worse. Document Released: 03/25/2005 Document Revised: 08/09/2013 Document Reviewed: 12/12/2010 [facility] Patient Information 2015 [place], [place]. This information is not intended to replace advice given to you by your health care provider. Make sure you discuss any questions you have with your health care provider.

## 2013-12-25 NOTE — Progress Notes (Signed)
Family Medicine Teaching Service Daily Progress Note Intern Pager: [phone]  Patient name: Rachel [name] Medical record number: [id number] Date of birth: 12/04/1960 Age: 53 y.o. Gender: female  Primary Care Provider: Christa See, MD Consultants: Cardiology  Code Status: Full   Pt Overview and Major Events to Date:  9/14: Admitted for dyspnea, prednisone 31m started, HCAP coverage  9/15: Cards c/s due to TWI on EKG  9/16: Weaned to 10L/m non re-breather  9/17: Weaned to 6L [place];  9/18: Transitioned to levaquin  ABX/culture:  Vanc 9/14 - 9/18  Cefepime 9/14 - 9/18  Levaquin 9/18 -   Blood Cx 9/14 - NGTD   Assessment and Plan: Rachel SHIPTONis a 53y.o. female who presented on 9/14 with 3 days of hypoxia (60-70% on 5-6L of home O2), productive cough, and subjective fevers as well as recent increased orthopnea, PND, and LE edema concerning for HCAP, CHF exacerbation and/or worsening of chronic pulmonary HTN and sarcoidosis. PMH is significant for pulmonary HTN, pulmonary sarcoidosis, cardiomyopathy, CHF, HTN, SVT, major depression with psychotic features, and GERD.   #Hypoxia secondary to possible HCAP vs. CHF exacerbation vs. progression of chronic pulmonary HTN and sarcoidosis: Hypoxia likely multifactorial in context of possible HCAP (recent hospitalization), CHF exacerbation, and progression of chronic disease.  -- Keep O2 sats >90%; weaned to 6L [place]  -- Levaquin (9/18- ) for a 7 day total course  -- Lasix 480mPO BID  -- DuoNebs q4h; home symbicort   #Chest pain: resolved. Evidence of TWI on 9/15 EKG but troponin x3 negative and pain reproducible on exam.  -- Consulted cardiology, no intervention pursued  -- Troponin x 3 negative   #Pulmonary HTN, right-sided CHF: Resolved. Echo (9/15) minimally changed with EF 55-60%, moderately dilated RV, and mildly reduced systolic function.  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 Followed by Dr. ByLamonte Sakai -- Dr. ByLamonte Sakaiecommended assessing need for oximizer before discharge and agreed with plan to treat with 5 day course of prednisone 2027mnd discharge home on 41m36m- day 5/5 prednisone 20 mg  -- Oxymizer at bedside, RT can ambulate to titrate O2 needed prior to discharge  -- Continue home symbicort. DuoNebs as above   #Normocytic anemia: Hb 10.7 on admission with MCV of 89.7. Most likely secondary to anemia of chronic disease. Baseline between 10-11.  -- Continue to monitor   #Possible UTI: Resolved. Patient reports suprapubic pain, dysuria. UA (gathered after antibiotics); with ketones and protein -- completed treatmented   #HTN: Intermittent hypotension to the 100s-90s/50s-60s.  -- Decreased metoprolol from 25 to 12.5mg 63m  -- Continue home Lasix 40mg 54mID   #Hx of major depression with psychotic features: Reports that she also experiences psychosis on high-dose steroids. Agreed to try 20mg p33misone upon admission.  -- Continue home paroxetine, abilify   #Hx of GERD:  -- Continue home protonix 40mg da108m  FEN/GI: SLIV, heart healthy diet  Prophylaxis: Lovenox   Disposition: pending discharge today   Subjective:  Sitting up in bed and feeling well.   Objective: Temp:  [97.7 F (36.5 C)-98.5 F (36.9 C)] 98.5 F (36.9 C) (09/19 0524) Pulse Rate:  [61-88] 71 (09/19 0524) Resp:  [16-23] 20 (09/19 0524) BP: (98-120)/(51-66) 114/57 mmHg (09/19 0524) SpO2:  [97 %-100 %] 100 % (09/19 0524) Weight:  [162 lb 4.1 oz (73.6 kg)-164 lb 14.5 oz (74.8 kg)] 164 lb  14.5 oz (74.8 kg) (09/19 0524) Physical Exam: General: Pleasant woman with [place] in place.  Cardiovascular: RRR normal s1s2, no m/r/g.  Respiratory: Fine crackles throughout. Improved since admission.  Abdomen: +bs. Soft, nondistended. Tenderness to palpation of suprapubic region.  Extremities: Trace pitting edema to the mid-calf.  Skin: No lesions  appreciated.  Neuro: Alert and oriented x 3. No gross abnormalities    Laboratory:  Recent Labs Lab 12/23/13 0230 12/24/13 0239 12/25/13 0426  WBC 9.9 10.6* 10.0  HGB 9.9* 9.8* 9.9*  HCT 32.1* 31.8* 31.5*  PLT 264 264 283    Recent Labs Lab 12/20/13 1143 12/21/13 0138 12/23/13 0230  NA 143 138 140  K 4.0 4.3 3.7  CL 101 99 96  CO2 29 28 32  BUN _0 CREATININE 0.71 0.65 0.75  CALCIUM 9.5 9.0 8.9  GLUCOSE 93 138* 96    Imaging/Diagnostic Tests:   Rosemarie Ax, MD 12/25/2013, 9:08 AM PGY-2, [place] Intern pager: [phone], text pages welcome

## 2013-12-25 NOTE — Care Management Note (Signed)
Page 1 of 2   12/25/2013     10:18:08 AM CARE MANAGEMENT NOTE 12/25/2013  Patient:  Rachel [name], Rachel [name]   Account Number:  [id number]  Date Initiated:  12/21/2013  Documentation initiated by:  DAVIS,RHONDA  Subjective/Objective Assessment:   53 y.o. female presenting with a 3 day history of hypoxia (60-70% on 5-6L of home O2), productive cough, and subjective fevers concerning for HCAP, CHF exacerbation and/or worsening of chronic pulmonary HTN and sarcoidosis.     Action/Plan:   return to home with o2 and hhc/PT and RN [facility] notified of admission   Anticipated DC Date:  12/27/2013   Anticipated DC Plan:  [place]  In-house referral  NA      DC Planning Services  CM consult      [facility] Choice  Resumption Of Svcs/PTA Provider   Choice offered to / List presented to:  NA   DME arranged  OTHER - SEE COMMENT      DME agency  [place] arranged  HH-1 RN  HH-10 DISEASE MANAGEMENT  HH-2 PT      [place].   Status of service:  Completed, signed off Medicare Important Message given?  YES (If response is "NO", the following Medicare IM given date fields will be blank) Date Medicare IM given:  12/24/2013 Medicare IM given by:  DAVIS,RHONDA Date Additional Medicare IM given:   Additional Medicare IM given by:    Discharge Disposition:  [place]  Per UR Regulation:  Reviewed for med. necessity/level of care/duration of stay  If discussed at [place] of Stay Meetings, dates discussed:    Comments:   12-25-13 1015 Jacqlyn Krauss, RN,BSN [phone] CM received a call from MD in ref to [facility] for liquid 02. Pt has Oximizer in room. [facility] is aware of DME and [place] referral called in to [facility] for RN and PT resumption. NO further needs from CM at this time.   [id number]/LDKCCQ Davis,RN,BSN,CCM: o2 down to 10l with the oximizer/iv abx being switched to p.o./poss dc to home with hhc if  tolerated p.o. Levaquin.  [id number]/Rhonda Davis,RN,BSN,CCM: tct-DME rep with [facility]/made aware of need for oxymizer/will deliver to mc-2h20c.  [id number]/VHOYWV Davis,RN,BSN,CCM:

## 2013-12-25 NOTE — Evaluation (Signed)
Physical Therapy Evaluation Patient Details Name: Rachel [name] MRN: [id number] DOB: 1961-02-22 Today's Date: 12/25/2013   History of Present Illness  Rachel [name] is a 53 y.o. female presenting with a 3 day history of hypoxia (60-70% on 5-6L of home O2), productive cough, and subjective fevers concerning for HCAP, CHF exacerbation and/or worsening of chronic pulmonary HTN and sarcoidosis. PMH is significant for pulmonary HTN, pulmonary sarcoidosis, cardiomyopathy, CHF, HTN, SVT, major depression with psychotic features, and GERD.  Clinical Impression  Pt ambulated 160' total, O2 sats 93% with RW, 88% with cane, both times on 6L O2 [place]. Pt mod I with general activity, recommend resuming HHPT for there ex and energy conservation. PT signing off.    Follow Up Recommendations Home health PT    Equipment Recommendations  None recommended by PT    Recommendations for Other Services       Precautions / Restrictions Precautions Precautions: Other (comment) Precaution Comments: monitor O2 sats Restrictions Weight Bearing Restrictions: No      Mobility  Bed Mobility Overal bed mobility: Modified Independent                Transfers Overall transfer level: Modified independent Equipment used: None             General transfer comment: pt sits and stands safely without AD  Ambulation/Gait Ambulation/Gait assistance: Modified independent (Device/Increase time) Ambulation Distance (Feet): 160 Feet (100', 60') Assistive device: Rolling walker (2 wheeled);Straight cane Gait Pattern/deviations: Step-through pattern;Decreased stride length Gait velocity: very slow due to respiratory status Gait velocity interpretation: Below normal speed for age/gender General Gait Details: ambulated 100' with RW with O2 sats 93% on 6L O2, 3' with SPC with O2 sats 88% on 6L O2. vc's for posture, pt with good breathing technique and good job minimizing verbalization to Product/process development scientist    Modified Rankin (Stroke Patients Only)       Balance Overall balance assessment: Modified Independent                                           Pertinent Vitals/Pain Pain Assessment: No/denies pain See O2 sats above in impression    Home Living Family/patient expects to be discharged to:: Private residence Living Arrangements: Children Available Help at Discharge: Family;Available PRN/intermittently Type of Home: Apartment Home Access: Level entry     Home Layout: One level Home Equipment: Cane - single point;Walker - 4 wheels (pulse ox)      Prior Function Level of Independence: Independent with assistive device(s)         Comments: cane for shorter distances, RW for longer. Sits to wash hair, stands in tub to shower off     Hand Dominance   Dominant Hand: Right    Extremity/Trunk Assessment   Upper Extremity Assessment: Overall WFL for tasks assessed           Lower Extremity Assessment: Generalized weakness      Cervical / Trunk Assessment: Normal  Communication   Communication: No difficulties  Cognition Arousal/Alertness: Awake/alert Behavior During Therapy: WFL for tasks assessed/performed Overall Cognitive Status: Within Functional Limits for tasks assessed                      General Comments General comments (skin integrity, edema,  etc.): pt demonstrating good energy conservation techniques, discussed benefits of RW vs cane from energy standpoint    Exercises Other Exercises Other Exercises: reviewed HEP, pt will be returning home with HHPT      Assessment/Plan    PT Assessment All further PT needs can be met in the next venue of care  PT Diagnosis Generalized weakness   PT Problem List Cardiopulmonary status limiting activity  PT Treatment Interventions     PT Goals (Current goals can be found in the Care Plan section) Acute Rehab PT Goals Patient Stated Goal:  return home PT Goal Formulation: No goals set, d/c therapy    Frequency     Barriers to discharge        Co-evaluation               End of Session Equipment Utilized During Treatment: Oxygen Activity Tolerance: Patient tolerated treatment well Patient left: in bed;with call bell/phone within reach Nurse Communication: Mobility status         Time: 2671-2458 PT Time Calculation (min): 32 min   Charges:   PT Evaluation $Initial PT Evaluation Tier I: 1 Procedure PT Treatments $Gait Training: 23-37 mins   PT G Codes:        Leighton Roach, PT  Acute Rehab Services  [place], [place] 12/25/2013, 12:32 PM

## 2013-12-25 NOTE — Progress Notes (Addendum)
Patient discharged to home on 6L via nasal cannula, transported by [facility].  Discharge instructions, education, and medications discussed with patient prior to discharge; patient voiced understanding of information presented.  Patient provided with one ampule of home respiratory medication; form signed and in chart.  Will continue to monitor.

## 2013-12-25 NOTE — Clinical Social Work Note (Signed)
CSW made aware by patient's RN Raquel Sarna) of transportation assistance home. CSW met with patient and confirmed address and transportation request. CSW to arrange transportation via [place]. No further needs. CSW signing off.  [place], [place] Weekend Clinical Social Worker [phone]

## 2013-12-25 NOTE — Progress Notes (Signed)
FMTS ATTENDING  NOTE Roshanna Cimino,MD I  have seen and examined this patient, reviewed their chart. I have discussed this patient with the resident. I agree with the resident's findings, assessment and care plan.

## 2013-12-25 NOTE — Progress Notes (Signed)
Patient ambulated ~631f in hallway with PT.  On 6L via nasal cannula, patient maintained oxygen saturation of 93%.  At rest, patient's oxygen saturation returned to 100% on 6L.  Will continue to monitor.

## 2013-12-25 NOTE — Plan of Care (Signed)
Problem: ICU Phase Progression Outcomes Goal: Flu/PneumoVaccines if indicated Outcome: Adequate for Discharge Patient will follow up with PCP  Problem: Discharge Progression Outcomes Goal: Flu vaccine received if indicated Outcome: Adequate for Discharge Patient will follow-up with PCP

## 2013-12-26 LAB — CULTURE, BLOOD (ROUTINE X 2)
CULTURE: NO GROWTH
Culture: NO GROWTH

## 2013-12-27 NOTE — Discharge Summary (Signed)
FMTS ATTENDING  NOTE Sokha Craker,MD I  have seen and examined this patient, reviewed their chart. I have discussed this patient with the resident. I agree with the resident's findings, assessment and care plan.

## 2013-12-28 LAB — URINE CULTURE: Colony Count: 30000

## 2014-01-05 ENCOUNTER — Ambulatory Visit (HOSPITAL_COMMUNITY)
Admission: RE | Admit: 2014-01-05 | Discharge: 2014-01-05 | Disposition: A | Discharge status: Home / Self Care | Payer: Medicare Other | Source: Ambulatory Visit | Attending: Internal Medicine | Admitting: Internal Medicine

## 2014-01-05 ENCOUNTER — Telehealth: Payer: Self-pay | Admitting: Emergency Medicine

## 2014-01-05 VITALS — BP 108/60 | HR 77 | Wt 165.5 lb

## 2014-01-05 DIAGNOSIS — IMO0002 Reserved for concepts with insufficient information to code with codable children: Secondary | ICD-10-CM | POA: Insufficient documentation

## 2014-01-05 DIAGNOSIS — I509 Heart failure, unspecified: Secondary | ICD-10-CM | POA: Insufficient documentation

## 2014-01-05 DIAGNOSIS — D869 Sarcoidosis, unspecified: Secondary | ICD-10-CM | POA: Diagnosis not present

## 2014-01-05 DIAGNOSIS — I2789 Other specified pulmonary heart diseases: Secondary | ICD-10-CM | POA: Insufficient documentation

## 2014-01-05 DIAGNOSIS — R0902 Hypoxemia: Secondary | ICD-10-CM | POA: Diagnosis not present

## 2014-01-05 DIAGNOSIS — J9621 Acute and chronic respiratory failure with hypoxia: Secondary | ICD-10-CM

## 2014-01-05 DIAGNOSIS — Z9981 Dependence on supplemental oxygen: Secondary | ICD-10-CM | POA: Diagnosis not present

## 2014-01-05 DIAGNOSIS — J9620 Acute and chronic respiratory failure, unspecified whether with hypoxia or hypercapnia: Secondary | ICD-10-CM | POA: Diagnosis not present

## 2014-01-05 DIAGNOSIS — I5032 Chronic diastolic (congestive) heart failure: Secondary | ICD-10-CM | POA: Insufficient documentation

## 2014-01-05 DIAGNOSIS — Z87891 Personal history of nicotine dependence: Secondary | ICD-10-CM | POA: Diagnosis not present

## 2014-01-05 LAB — BASIC METABOLIC PANEL
ANION GAP: 11 (ref 5–15)
BUN: 17 mg/dL (ref 6–23)
CO2: 30 mEq/L (ref 19–32)
Calcium: 9.6 mg/dL (ref 8.4–10.5)
Chloride: 99 mEq/L (ref 96–112)
Creatinine, Ser: 0.83 mg/dL (ref 0.50–1.10)
GFR calc Af Amer: >90 mL/min (ref >90)
GFR, EST NON AFRICAN AMERICAN: 79 mL/min — AB (ref >90)
Glucose, Bld: 102 mg/dL — ABNORMAL HIGH (ref 70–99)
POTASSIUM: 4 meq/L (ref 3.7–5.3)
SODIUM: 140 meq/L (ref 137–147)

## 2014-01-05 LAB — PRO B NATRIURETIC PEPTIDE: PRO B NATRI PEPTIDE: 549.1 pg/mL — AB (ref 0–125)

## 2014-01-05 NOTE — Telephone Encounter (Signed)
LM with RT Cecile Hearing [phone]

## 2014-01-05 NOTE — Telephone Encounter (Signed)
Rachel [name] returned call  [phone]

## 2014-01-05 NOTE — Telephone Encounter (Signed)
RB- do you want to prescribe a power wheelchair for this pt  This is what PT is rec since sats drop with minimal exertion  Please advise, thanks!

## 2014-01-05 NOTE — Patient Instructions (Signed)
Labs today  We will contact you in 4 months to schedule your next appointment.  

## 2014-01-05 NOTE — Telephone Encounter (Signed)
Called and left a detailed msg on machine informing Rachel [name] that we will discuss this with the pt at the next visit com,ing up on 01/07/14

## 2014-01-05 NOTE — Telephone Encounter (Signed)
I would like to followup with her first before committing her to this. We will discuss it at her next office visit. This can be moved up to discuss sooner if she would

## 2014-01-06 NOTE — Progress Notes (Signed)
Patient ID: Rachel [name], female   DOB: 1960-10-28, 53 y.o.   MRN: [id number]  PCP: Dr. Venetia Maxon  53 y.o. 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 She is now on Revatio 80 mg tid and and inhaled Iloprost.  She has been evaluated for heart/lung transplant at [facility] as an inpatient but needed to raise around $10,000 to continue evaluation and decided against it. She has been seeing Dr. Lamonte Sakai for her sarcoid.     Admitted in 4/15 with acute bronchitis and possibly some CHF.  She was given antibiotics, IV Lasix, and prednisone for possible component of sarcoid flare. Admitted 8/5-11/13/13 with dyspnea and was thought to be combination of CHF, sarcoidosis and underlying PNA. Treated with levofloxacin and prednisone and lasix was increased to BID for 1 week post discharge. She was admitted again in 9/15 with hypoxic respiratory failure thought to be HCAP versus progression of sarcoid.  She was treated with steroids and antibiotics.  She remains on prednisone 10 mg daily. CTA chest in 9/15 showed honeycombing and fibrosis consistent with pulmonary sarcoidosis, no PE.     6 minute (4/15): 123 m.      RHC (8/12): mean RA 2, PA 51/24, mean PA 35, mean PCWP 6, CI 3, PVR 5.1 WU.   Labs (11/10): BNP 91, creatinine 1.0, K 4  Labs (2/11): K 4.6, creatinine 0.86  Labs (3/11): BNP 58, K 4.2, creatinine 0.8  Labs (10/11): K 4.5, creatinine 0.8, BNP 76  Labs (7/13): LDL 102, K 4.1, creatinine 0.85 Labs (4/15): K 4.2, creatinine 0.76, BNP 467 Labs (9/15) K 3.7, creatinine 0.75  Allergies (verified):  1) ! * Steroids High Dose  2) Demerol  3) Ultram  4) Darvocet-N 100  5) Doxycycline   Past Medical History:  1. Sarcoidosis: Stage IV pulmonary sarcoid on home O2.  She has been referred to [facility] for lung transplant evaluation.  2. Cardiomyopathy: Cardiac MRI in 2004 with no evidence for infiltrative disease but EF 40%. Echo (9/10): EF 60%,  severely dilated RV with moderately decreased systolic function, moderate RAE, PASP 83 mmHg.  Echo (1/12): EF 26%, grade I diastolic dysfunction, moderate biatrial enlargement, moderate RV dilation with normal RV systolic function, peak RV-RA gradient 34 mmHg.  Echo (8/13): EF 55-60%, mildly dilated RV with mild systolic dysfunction, PA systolic pressure 45 mmHg. Echo (4/15) with EF 60%, normal RV size/systolic function, unable to estimate PA systolic pressure.  3. Pulmonary hypertension with RV failure: Moderate to severe, likely secondary to sarcoidosis and parenchymal lung disease/hypoxic pulmonary vasoconstriction. RHC 5/10 showed PA 67/30 (mean 46) with mean PCWP 6 mmHg. Patient started on Revatio in 5/10 without any change in oxygen saturation. 6 minute walk 7/10: 61 m. Echo (9/10): EF 60%, severely dilated RV with moderately decreased systolic function, moderate RAE, PASP 83 mmHg. RHC (9/10) with mean RA 15, PA 74/36, CI 2.8. Repeat RHC after diuresis with mean RA 3, PA 60/22, mean PCWP 4. Echo (1/11): EF 71-24%, grade I diastolic dysfunction, moderately dilated RV, mild to moderate RV dysfunction, moderate to severe TR, PASP 58 mmHg. 6 minute walk (2/11): 122 m. 6 min walk (6/11): 161.5 m. RHC (6/11): mean RA 11, RV 64/15, PA 63/28 mean 43, mean PCWP 14, CI 2.4 thermodilution and 3.2 Fick, PVR 5.3 WU Fick and 7.25 WU thermodilution. Iloprost begun. 6 min walk (10/11): 158 m. 6 min walk (1/12) 273 m.  6 min walk (7/12) 248  m.  RHC (8/12): mean RA 2, PA 51/24, mean PA 35, mean PCWP 6, CI 3, PVR 5.1 WU.  PASP 45 mmHg on 8/13 echo.  6 minute walk (4/14): 223 m. 6 minute walk (9/14) 299 m. 6 min walk (4/15) 123 m.  Echo (9/15) with EF 55-60%, moderately dilated RV with mildly decreased systolic function, PA systolic pressure 54.  4. 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 Given end stage lung disease and normalization of LV function, the device was not replaced at ERI and tachy therapies were turned off.   5. GERD  6. Chronic chest pain. LHC (5/10) with no angiographic coronary disease.  7. Allergic rhinitis,  8. h/o iron deficiency anemia.  9. H/o secondary amenorrhea/irregular menses,  10. Psychosis with high dose steroids.  11. SVT  12. Recurrent Hypokalemia  13. h/o Rotator cuff sprain  14. 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   ROS: All systems reviewed and negative except as per HPI.    Current Outpatient Prescriptions  Medication Sig Dispense Refill  . albuterol (PROAIR HFA) 108 (90 BASE) MCG/ACT inhaler Inhale 2 puffs into the lungs 4 (four) times daily.  18 g  11  . ARIPiprazole (ABILIFY) 2 MG tablet Take 1 tablet (2 mg total) by mouth every other day.  30 tablet  1  . aspirin EC 81 MG tablet Take 1 tablet (81 mg total) by mouth daily.  1 tablet  0  . budesonide-formoterol (SYMBICORT) 160-4.5 MCG/ACT inhaler Inhale 1 puff into the lungs 2 (two) times daily.  1 Inhaler  3  . esomeprazole (NEXIUM) 40 MG capsule Take 40 mg by mouth every morning.      . furosemide (LASIX) 20 MG tablet Take 2 tablets (20m) every morning and take 1 tablet (264m every evening.  90 tablet  0  . metoprolol tartrate (LOPRESSOR) 12.5 mg TABS tablet Take 0.5 tablets (12.5 mg total) by mouth 2 (two) times daily.  60 tablet  0  . PARoxetine (PAXIL) 20 MG tablet Take 1 tablet (20 mg total) by mouth daily.  30 tablet  2  . potassium chloride SA (K-DUR,KLOR-CON) 20 MEQ tablet Take 1-2 tablets (20-40 mEq total) by mouth 2 (two) times daily. 40 meq in the morning and 20 meq at night  90 tablet  3  .  predniSONE (DELTASONE) 10 MG tablet Take 1 tablet (10 mg total) by mouth daily with breakfast.  14 tablet  0  . sildenafil (REVATIO) 20 MG tablet Take 4 tablets (80 mg total) by mouth 3 (three) times daily. take 4  tablets every 8 hours  10 tablet  0  . Treprostinil (TYVASO) 0.6 MG/ML SOLN Inhale 18 mcg into the lungs 4 (four) times daily.  3.6 mL  11   No current facility-administered medications for this encounter.    BP 108/60  Pulse 77  Wt 165 lb 8 oz (75.07 kg)  SpO2 87% General: Well-developed,well-nourished,in no acute distress; O2 applied via nasal cannula  Neck: Neck supple, JVP 7 cm. No masses, thyromegaly or abnormal cervical nodes.  Lungs:  Crackles at bases bilaterally.   Heart: Non-displaced PMI, chest non-tender; regular rate and rhythm, S1, S2 without rubs or gallops. 1/6 systolic murmur LLSB. Loud P2. Carotid upstroke normal, no bruit. Pedals normal pulses. Trace ankle edema bilaterally. Abdomen: Bowel sounds positive; abdomen soft and tender without masses, organomegaly, or hernias noted. No hepatosplenomegaly.  Extremities: No clubbing or cyanosis.  Neurologic: Alert and oriented x 3.  Psych: Normal affect.  Assessment/Plan:  PULMONARY HYPERTENSION  She has sarcoidosis but has pulmonary hypertension out of proportion to sarcoidosis (suspect WHO group 1 component).  She is currently on Tyvaso and Revatio. Last echo in 9/15 showed normal LV EF with moderately dilated RV and mildly reduced RV systolic function, PA systolic pressure 54 mmHg. NYHA class IV symptoms that I suspect are more due to pulmonary parenchymal disease than pulmonary hypertension.  Sarcoidosis Patient has end-stage lung disease from pulmonary sarcoidosis and has been on 4 L oxygen at home.  I suspect that this is the main source of her dyspnea.  She is on prednisone.  Chronic diastolic CHF with primarily RV failure:   Stable.  She is on Lasix 40 qam/20 qpm which I will continue.  BMET/BNP today.      [name] MD 01/06/2014   Patient seen with NP, agree with the above note.  Patient has end-stage lung disease from pulmonary sarcoidosis and has been on 4 L oxygen at home.  She is on Tyvaso and Revatio for severe pulmonary arterial hypertension as well.  Today, oxygen saturation is in the 60-70% range on 4L oxygen.  She says it has been like this at home since she left the hospital after recent admission.  She is short of breath at rest.  She does not appear volume overloaded and weight is stable.  I am going to send her to the ER for evaluation, she will need admission.  She needs pulmonary evaluation and will need oxygen by facemask versus Bipap.  She has had subjective fevers.  ? PNA or acute bronchitis superimposed on baseline severe lung disease from advanced pulmonary sarcoidosis.  It would be reasonable to start her on Lasix 40 mg IV bid while hospitalized though I think this is more of a pulmonary issue with lesser role for CHF/pulmonary edema.   [name] temporal arteritis 11:47 PM

## 2014-01-07 ENCOUNTER — Other Ambulatory Visit: Payer: Self-pay | Admitting: Family Medicine

## 2014-01-07 ENCOUNTER — Encounter: Payer: Self-pay | Admitting: Emergency Medicine

## 2014-01-07 ENCOUNTER — Telehealth: Payer: Self-pay | Admitting: Emergency Medicine

## 2014-01-07 ENCOUNTER — Ambulatory Visit (INDEPENDENT_AMBULATORY_CARE_PROVIDER_SITE_OTHER): Payer: Medicare Other | Admitting: Emergency Medicine

## 2014-01-07 VITALS — BP 124/82 | HR 82 | Temp 97.5°F | Ht 63.0 in | Wt 166.6 lb

## 2014-01-07 DIAGNOSIS — Z23 Encounter for immunization: Secondary | ICD-10-CM

## 2014-01-07 DIAGNOSIS — IMO0002 Reserved for concepts with insufficient information to code with codable children: Secondary | ICD-10-CM

## 2014-01-07 DIAGNOSIS — I272 Other secondary pulmonary hypertension: Secondary | ICD-10-CM

## 2014-01-07 DIAGNOSIS — J309 Allergic rhinitis, unspecified: Secondary | ICD-10-CM

## 2014-01-07 DIAGNOSIS — D869 Sarcoidosis, unspecified: Secondary | ICD-10-CM

## 2014-01-07 MED ORDER — PREDNISONE 5 MG PO TABS: ORAL_TABLET | ORAL | Status: DC

## 2014-01-07 MED ORDER — LORATADINE 10 MG PO TABS: 10.0000 mg | ORAL_TABLET | Freq: Every day | ORAL | Status: DC

## 2014-01-07 NOTE — Assessment & Plan Note (Signed)
Start loratadine Restart her nasal spray - she will call us w the name

## 2014-01-07 NOTE — Telephone Encounter (Signed)
Called pt. Confirm medication is ayr saline nasal spray.  Her prednisone was never called into the pharm I have done so. Nothing further needed

## 2014-01-07 NOTE — Progress Notes (Signed)
Subjective:    Patient ID: Rachel [name], female    DOB: July 25, 1960, 53 y.o.   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 She is now on Revatio 80 mg tid and and inhaled Iloprost (added in 2011), managed by Dr Aundra Dubin at [facility]. 6 min walk (7/12) 248 m. RHC (8/12): mean RA 2, PA 51/24, mean PA 35, mean PCWP 6, CI 3, PVR 5.1 WU. She believes that she is doing very well. She wears O2 at 6L/min at all times. She believes she knows when her sarcoidosis is active, remains on flovent.   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 Followed by Dr Aundra Dubin > current PAH meds are Revatio 80 tid + Ventavis. Most recent echocardiogram in 01/07/13 showed estimated PASP 44 mmHg (stable), decreased EF.  Last time we did a trial of Symbicort > she feels that it has helped some, she is less SOB w exertion, able to do more without pacing herself. It has made her face and neck more swollen/puffy, she feels that she is eating more.   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 She is on an aggressive PAH regimen. PFT support mixed disease. She benefited from Symbicort but insurance has rejected and says she needs to fail Advair. She has the Advair but hasn't tried it yet.   08/17/13  Acute OV    Complains of prod cough with yellow mucus, increased DOE, wheezing, runny/stuffy nose, HA, PND x3days.  Denies tightness, f/c/s, hemoptysis, nausea, vomiting.  Patient denies any chest pain, rash, joint swelling. Complains of a postnasal drip, and drainage. No recent travel. Patient was recently admitted last month for possible fluid overload . Symptoms improved with diuresis. She was also given empiric Levaquin for possible underlying pneumonia chest x-ray showed diffuse pulmonary fibrosis, with no acute changes noted  ROV 08/26/13 -- follow up  visit for stage 4 sarcoid and PAH, has been on ventavis and sildenifil 80 tid. Her family tells me today that her ventavis has been decreased; Dr Claris Gladden notes do not say this but instead that they are trying to change to Tyvaso (4x a day). The pt has been confused, agitated and was taken to ED this week. They found that she had a UTI which has been treated. The family is concerned that she is not safe to be alone right now. She is scheduled to see a psychologist.    Rachel [name] 12/03/13 -- stage 4 sarcoidosis, secondary PAH. She was  Just hospitalized x 2 for dyspnea, possible PNA, but largely volume overload. She is now on Tyvaso + sildenifil 80.  She is now improved s/p diuresis. She is having HA, possibly in association w her tyvaso.  She is on symbicort bid. C/o PND today.   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 She is on 5-6L/min continuous O2. She has pulsed O2 for exertion but this is inadequate - frequently desats to the 70's. She remains on sildenifil + tyvaso. She is having PND for last few weeks. She remains on pred 10 post-hospitalization.    Objective:   Physical Exam Filed Vitals:   01/07/14 1134  BP: 124/82  Pulse: 82  Temp: 97.5 F (36.4 C)  TempSrc: Oral  Height: _0  (1.6 m)  Weight: 166 lb 9.6 oz (75.569  kg)  SpO2: 87%    Gen: Pleasant, overwt, in no distress on O2,  normal affect  ENT: No lesions,  mouth clear,  oropharynx clear, no postnasal drip  Neck: No JVD, no TMG, no carotid bruits  Lungs: No use of accessory muscles, few B insp crackles, no wheezing   Cardiovascular: RRR, no m/r/g   Musculoskeletal: No deformities, no cyanosis or clubbing  Neuro: alert, no agitation or confusion noted today.   Skin: Warm, no lesions or rashes   07/28/13 --   Study Conclusions  - Left ventricle: The cavity size was normal. Wall thickness was increased in a pattern of mild LVH. The estimated ejection fraction was 60%. Wall  motion was normal; there were no regional wall motion abnormalities. Doppler parameters are consistent with abnormal left ventricular relaxation (grade 1 diastolic dysfunction). - Mitral valve: Flat closure of mitral valve. Trivial regurgitation. - Right ventricle: The cavity size was normal. Pacer wire or catheter noted in right ventricle. Systolic function was normal. - Right atrium: The atrium was mildly dilated    Assessment & Plan:  Secondary pulmonary hypertension Please continue your Tyvaso and Revatio as you have been taking them Where your oxygen at 5-6 L per minute continuous flow through an Oxymizer. We will work on obtaining a Web designer to replace your current system.    PULMONARY SARCOIDOSIS Will decrease pred to 5 x 3 weeks and then attempt to wean to off.   Allergic rhinitis Start loratadine Restart her nasal spray - she will call us w the name

## 2014-01-07 NOTE — Assessment & Plan Note (Signed)
Please continue your Tyvaso and Revatio as you have been taking them Where your oxygen at 5-6 L per minute continuous flow through an Oxymizer. We will work on obtaining a Web designer to replace your current system.

## 2014-01-07 NOTE — Patient Instructions (Signed)
Please continue your Tyvaso and Revatio as you have been taking them Where your oxygen at 5-6 L per minute continuous flow through an Oxymizer. We will work on obtaining a Web designer to replace your current system.  Decrease your prednisone to 5 mg daily for the next 3 weeks and then stop.  Restart your nasal spray as you were taking them. Please call us to give [place] the name of this medication.  Follow with Dr Lamonte Sakai in 1 month

## 2014-01-07 NOTE — Assessment & Plan Note (Signed)
Will decrease pred to 5 x 3 weeks and then attempt to wean to off.

## 2014-01-07 NOTE — Telephone Encounter (Signed)
Pt called because she was prescribed Prednisone. She will be out in one more day and didn't know if Dr. Venetia Maxon wanted to fill this now or wait until she see's him on 10/6. Please call and let the patient know either way. jw

## 2014-01-10 ENCOUNTER — Other Ambulatory Visit: Payer: Self-pay | Admitting: Family Medicine

## 2014-01-10 ENCOUNTER — Telehealth: Payer: Self-pay | Admitting: Emergency Medicine

## 2014-01-10 MED ORDER — POTASSIUM CHLORIDE CRYS ER 20 MEQ PO TBCR: 20.0000 meq | EXTENDED_RELEASE_TABLET | Freq: Two times a day (BID) | ORAL | Status: DC

## 2014-01-10 MED ORDER — ESOMEPRAZOLE MAGNESIUM 40 MG PO CPDR: 40.0000 mg | DELAYED_RELEASE_CAPSULE | ORAL | Status: DC

## 2014-01-10 NOTE — Telephone Encounter (Signed)
Needs refills  Potassium chloride nexium  [facility] on [address]

## 2014-01-10 NOTE — Telephone Encounter (Signed)
Per 01/07/14 OV; Patient Instructions      Please continue your Tyvaso and Revatio as you have been taking them Where your oxygen at 5-6 L per minute continuous flow through an Oxymizer. We will work on obtaining a Web designer to replace your current system.   Decrease your prednisone to 5 mg daily for the next 3 weeks and then stop.    Called pt. Made her aware she is to take pred 5 mg daily x 3 weeks then stop. She voiced her understanding and needed nothing further

## 2014-01-11 ENCOUNTER — Inpatient Hospital Stay: Payer: Self-pay | Admitting: Family Medicine

## 2014-01-17 ENCOUNTER — Encounter: Payer: Self-pay | Admitting: Family Medicine

## 2014-01-17 ENCOUNTER — Ambulatory Visit (INDEPENDENT_AMBULATORY_CARE_PROVIDER_SITE_OTHER): Payer: Medicare Other | Admitting: Family Medicine

## 2014-01-17 VITALS — BP 127/74 | HR 83 | Temp 97.7°F | Ht 63.0 in | Wt 163.0 lb

## 2014-01-17 DIAGNOSIS — J9621 Acute and chronic respiratory failure with hypoxia: Secondary | ICD-10-CM

## 2014-01-17 DIAGNOSIS — I5032 Chronic diastolic (congestive) heart failure: Secondary | ICD-10-CM

## 2014-01-17 DIAGNOSIS — D869 Sarcoidosis, unspecified: Secondary | ICD-10-CM

## 2014-01-17 DIAGNOSIS — K219 Gastro-esophageal reflux disease without esophagitis: Secondary | ICD-10-CM

## 2014-01-17 MED ORDER — BENZONATATE 100 MG PO CAPS: 100.0000 mg | ORAL_CAPSULE | Freq: Two times a day (BID) | ORAL | Status: DC | PRN

## 2014-01-17 MED ORDER — ESOMEPRAZOLE MAGNESIUM 40 MG PO CPDR: 40.0000 mg | DELAYED_RELEASE_CAPSULE | ORAL | Status: DC

## 2014-01-17 NOTE — Progress Notes (Signed)
   Subjective:    Patient ID: Rachel [name], female    DOB: [birth year], 53 y.o.   MRN: [id number]  HPI: Pt presents to clinic for hospital f/u; she was admitted 9/14-9/19 and treated for acute-on-chronic hypoxia with several modalities, including abx for possible HCAP, Lasix for component of CHF, home Revatio and Tyvaso for pulm HTN, and prednisone for end-stage sarcoid. She has been seen by her pulmonologist and heart failure doctors in the interim, and reports she is "doing better." She does complain of cough, recently, still with some greenish sputum, but no fever, no chills, and overall improved WOB now using an Oxymizer through [facility]. She is on a prednisone taper per pulm and is still on Lasix BID 40 mg qAM and 20 mg qPM per cardiology. She is also compliant with Revatio and Tyvaso; she needs a refill of Symbicort, which she gets through pulmonology.  Of note, pt does complain of increased heartburn, belching, and bad taste in her mouth / throat, especially when lying flat and when waking up in the morning. She attributes some of her cough to this and feels generic Nexium does not work near as well as the brand kind, and states her insurance told her the brand would be covered, so she requests refill of that. Otherwise states she is compliant with medications. She has not taken Mucinex for her cough / congestion because while it helps, it "dries her out" and makes her breathing worse, otherwise.  Review of Systems: As above.      Objective:   Physical Exam BP 127/74  Pulse 83  Temp(Src) 97.7 F (36.5 C) (Oral)  Ht _0  (1.6 m)  Wt 163 lb (73.936 kg)  BMI 28.88 kg/m2  SpO2 90% Gen: well-appearing adult female in NAD, appears older than stated age 89: [place], AT, EOMI, PERRLA, MMM, [place] in place (5L) Pulm: generally clear to auscultation, good bilateral air movement without focal consolidation or decreased sounds Cardio: RRR, no definite murmur appreciated but heart sounds faint Abd: soft,  nontender, BS+ Ext: warm, well-perfused, trace ankle edema symmetrically     Assessment & Plan:  See problem list notes.

## 2014-01-17 NOTE — Assessment & Plan Note (Signed)
Sarcoidosis-related symptomology ppears at baseline, though severe symptoms and end-stage disease, on prednisone currently per Dr. Lamonte Sakai, with plan to wean off after 5 mg daily dosing for 3 weeks, as of 10/2. Continue per pulmonology instructions, then f/u with them. See separate problem list notes; symptoms are a constellation and multifactorial with multiple disease processes.

## 2014-01-17 NOTE — Assessment & Plan Note (Signed)
A: At baseline, with minimal LE edema and resp status improved from last eval by me.  P: Continue Lasix daily. Defer lab check, for now. F/u with me in about 2 months, and sooner with CHF clinic as instructed.

## 2014-01-17 NOTE — Patient Instructions (Addendum)
Thank you for coming in, today!  I'm glad your breathing is doing better. Make sure you follow up with Dr. Lamonte Sakai and Dr. Aundra Dubin. Ask Dr. Lamonte Sakai about your Symbicort. For your cough, I will prescribe Tessalon. Take one perle twice a day as need for cough. DO NOT CHEW THEM. Swallow it whole.  I will also refill your Nexium. I specified for them to give you the brand-type. Call me if there are any problems with your prescriptions.  Come back to see me in about 2 months. If you need to see me sooner, come back any time. Please feel free to call with any questions or concerns at any time, at [phone]. --Dr. Venetia Maxon

## 2014-01-17 NOTE — Assessment & Plan Note (Addendum)
A: Apparently at baseline, multifactorial with pulmHTN, diastolic CHF, and end-stage pulmonary sarcoidosis. Following closely with CHF clinic and pulmonology, recently treated for HCAP without evidence for recurrence, currently. Some chronic upper airway congestion but otherwise nontoxic appearance.  P: Continue prednisone until completion per pulmonology, diuresis per cardiology, and pulmHTN meds as previous. Continue O2 as needed. F/u as needed; see separate problem list notes.  Addendum: additionally still with cough, with some production, possibly exacerbated by GERD (see separate problem list note). Rx for Tessalon to help with sleep primarily, but cautioned to f/u closer if needed in case PNA-like symptoms redevelop.

## 2014-01-17 NOTE — Assessment & Plan Note (Signed)
A: Mildly exacerbated based on description of symptoms while using "generic Nexium." Possibly contributing to cough, as well.  P: Rx provided specifying brand-name Nexium. Re-eval at next f/u. F/u PRN otherwise.

## 2014-01-21 NOTE — Telephone Encounter (Signed)
Rachel [name] Kitchen

## 2014-02-01 ENCOUNTER — Telehealth: Payer: Self-pay | Admitting: Family Medicine

## 2014-02-01 NOTE — Telephone Encounter (Signed)
Would like dr street--wants to talk to him

## 2014-02-02 ENCOUNTER — Inpatient Hospital Stay (HOSPITAL_COMMUNITY)
Admission: EM | Admit: 2014-02-02 | Discharge: 2014-02-08 | DRG: 291 | Disposition: A | Discharge status: Home Health | Payer: Medicare Other | Attending: Family Medicine | Admitting: Family Medicine

## 2014-02-02 ENCOUNTER — Encounter (HOSPITAL_COMMUNITY): Payer: Self-pay | Admitting: Emergency Medicine

## 2014-02-02 ENCOUNTER — Emergency Department (HOSPITAL_COMMUNITY): Payer: Medicare Other

## 2014-02-02 ENCOUNTER — Telehealth: Payer: Self-pay | Admitting: Emergency Medicine

## 2014-02-02 DIAGNOSIS — Z8249 Family history of ischemic heart disease and other diseases of the circulatory system: Secondary | ICD-10-CM

## 2014-02-02 DIAGNOSIS — I10 Essential (primary) hypertension: Secondary | ICD-10-CM | POA: Diagnosis present

## 2014-02-02 DIAGNOSIS — Z87891 Personal history of nicotine dependence: Secondary | ICD-10-CM

## 2014-02-02 DIAGNOSIS — G8929 Other chronic pain: Secondary | ICD-10-CM | POA: Diagnosis present

## 2014-02-02 DIAGNOSIS — J440 Chronic obstructive pulmonary disease with acute lower respiratory infection: Secondary | ICD-10-CM | POA: Diagnosis present

## 2014-02-02 DIAGNOSIS — J9620 Acute and chronic respiratory failure, unspecified whether with hypoxia or hypercapnia: Secondary | ICD-10-CM | POA: Diagnosis not present

## 2014-02-02 DIAGNOSIS — Z91040 Latex allergy status: Secondary | ICD-10-CM

## 2014-02-02 DIAGNOSIS — I5033 Acute on chronic diastolic (congestive) heart failure: Secondary | ICD-10-CM | POA: Diagnosis not present

## 2014-02-02 DIAGNOSIS — K219 Gastro-esophageal reflux disease without esophagitis: Secondary | ICD-10-CM | POA: Diagnosis present

## 2014-02-02 DIAGNOSIS — J441 Chronic obstructive pulmonary disease with (acute) exacerbation: Secondary | ICD-10-CM | POA: Diagnosis present

## 2014-02-02 DIAGNOSIS — Z7982 Long term (current) use of aspirin: Secondary | ICD-10-CM | POA: Diagnosis not present

## 2014-02-02 DIAGNOSIS — F1021 Alcohol dependence, in remission: Secondary | ICD-10-CM | POA: Diagnosis present

## 2014-02-02 DIAGNOSIS — M549 Dorsalgia, unspecified: Secondary | ICD-10-CM | POA: Diagnosis present

## 2014-02-02 DIAGNOSIS — Z801 Family history of malignant neoplasm of trachea, bronchus and lung: Secondary | ICD-10-CM

## 2014-02-02 DIAGNOSIS — F329 Major depressive disorder, single episode, unspecified: Secondary | ICD-10-CM | POA: Diagnosis present

## 2014-02-02 DIAGNOSIS — R630 Anorexia: Secondary | ICD-10-CM | POA: Insufficient documentation

## 2014-02-02 DIAGNOSIS — R0609 Other forms of dyspnea: Secondary | ICD-10-CM | POA: Diagnosis present

## 2014-02-02 DIAGNOSIS — R0600 Dyspnea, unspecified: Secondary | ICD-10-CM

## 2014-02-02 DIAGNOSIS — Z515 Encounter for palliative care: Secondary | ICD-10-CM | POA: Insufficient documentation

## 2014-02-02 DIAGNOSIS — Z886 Allergy status to analgesic agent status: Secondary | ICD-10-CM | POA: Diagnosis not present

## 2014-02-02 DIAGNOSIS — I420 Dilated cardiomyopathy: Secondary | ICD-10-CM | POA: Diagnosis present

## 2014-02-02 DIAGNOSIS — I509 Heart failure, unspecified: Secondary | ICD-10-CM

## 2014-02-02 DIAGNOSIS — R0602 Shortness of breath: Secondary | ICD-10-CM

## 2014-02-02 DIAGNOSIS — Z885 Allergy status to narcotic agent status: Secondary | ICD-10-CM

## 2014-02-02 DIAGNOSIS — I959 Hypotension, unspecified: Secondary | ICD-10-CM | POA: Diagnosis not present

## 2014-02-02 DIAGNOSIS — J811 Chronic pulmonary edema: Secondary | ICD-10-CM

## 2014-02-02 DIAGNOSIS — Z95810 Presence of automatic (implantable) cardiac defibrillator: Secondary | ICD-10-CM

## 2014-02-02 DIAGNOSIS — Z833 Family history of diabetes mellitus: Secondary | ICD-10-CM

## 2014-02-02 DIAGNOSIS — F419 Anxiety disorder, unspecified: Secondary | ICD-10-CM | POA: Diagnosis present

## 2014-02-02 DIAGNOSIS — I272 Other secondary pulmonary hypertension: Secondary | ICD-10-CM | POA: Diagnosis not present

## 2014-02-02 DIAGNOSIS — Z7952 Long term (current) use of systemic steroids: Secondary | ICD-10-CM | POA: Diagnosis not present

## 2014-02-02 DIAGNOSIS — D860 Sarcoidosis of lung: Secondary | ICD-10-CM | POA: Diagnosis present

## 2014-02-02 DIAGNOSIS — F29 Unspecified psychosis not due to a substance or known physiological condition: Secondary | ICD-10-CM | POA: Diagnosis present

## 2014-02-02 DIAGNOSIS — J9621 Acute and chronic respiratory failure with hypoxia: Secondary | ICD-10-CM

## 2014-02-02 DIAGNOSIS — IMO0002 Reserved for concepts with insufficient information to code with codable children: Secondary | ICD-10-CM

## 2014-02-02 DIAGNOSIS — D869 Sarcoidosis, unspecified: Secondary | ICD-10-CM

## 2014-02-02 HISTORY — DX: Shortness of breath: R06.02

## 2014-02-02 HISTORY — DX: Chronic obstructive pulmonary disease, unspecified: J44.9

## 2014-02-02 LAB — I-STAT CG4 LACTIC ACID, ED: Lactic Acid, Venous: 0.9 mmol/L (ref 0.5–2.2)

## 2014-02-02 LAB — URINALYSIS, ROUTINE W REFLEX MICROSCOPIC
BILIRUBIN URINE: NEGATIVE
GLUCOSE, UA: NEGATIVE mg/dL
HGB URINE DIPSTICK: NEGATIVE
KETONES UR: NEGATIVE mg/dL
Nitrite: NEGATIVE
PH: 5.5 (ref 5.0–8.0)
Protein, ur: NEGATIVE mg/dL
SPECIFIC GRAVITY, URINE: 1.022 (ref 1.005–1.030)
Urobilinogen, UA: 0.2 mg/dL (ref 0.0–1.0)

## 2014-02-02 LAB — PRO B NATRIURETIC PEPTIDE: PRO B NATRI PEPTIDE: 1294 pg/mL — AB (ref 0–125)

## 2014-02-02 LAB — CBC WITH DIFFERENTIAL/PLATELET
Basophils Absolute: 0.1 10*3/uL (ref 0.0–0.1)
Basophils Relative: 1 % (ref 0–1)
EOS PCT: 7 % — AB (ref 0–5)
Eosinophils Absolute: 0.7 10*3/uL (ref 0.0–0.7)
HCT: 35 % — ABNORMAL LOW (ref 36.0–46.0)
Hemoglobin: 10.4 g/dL — ABNORMAL LOW (ref 12.0–15.0)
LYMPHS PCT: 24 % (ref 12–46)
Lymphs Abs: 2.2 10*3/uL (ref 0.7–4.0)
MCH: 27.2 pg (ref 26.0–34.0)
MCHC: 29.7 g/dL — ABNORMAL LOW (ref 30.0–36.0)
MCV: 91.4 fL (ref 78.0–100.0)
Monocytes Absolute: 0.5 10*3/uL (ref 0.1–1.0)
Monocytes Relative: 6 % (ref 3–12)
NEUTROS ABS: 5.9 10*3/uL (ref 1.7–7.7)
Neutrophils Relative %: 62 % (ref 43–77)
PLATELETS: 325 10*3/uL (ref 150–400)
RBC: 3.83 MIL/uL — AB (ref 3.87–5.11)
RDW: 15.2 % (ref 11.5–15.5)
WBC: 9.3 10*3/uL (ref 4.0–10.5)

## 2014-02-02 LAB — COMPREHENSIVE METABOLIC PANEL
ALT: 13 U/L (ref 0–35)
AST: 14 U/L (ref 0–37)
Albumin: 3.3 g/dL — ABNORMAL LOW (ref 3.5–5.2)
Alkaline Phosphatase: 62 U/L (ref 39–117)
Anion gap: 13 (ref 5–15)
BILIRUBIN TOTAL: 0.3 mg/dL (ref 0.3–1.2)
BUN: 18 mg/dL (ref 6–23)
CHLORIDE: 102 meq/L (ref 96–112)
CO2: 27 meq/L (ref 19–32)
Calcium: 9.1 mg/dL (ref 8.4–10.5)
Creatinine, Ser: 0.82 mg/dL (ref 0.50–1.10)
GFR calc non Af Amer: 80 mL/min — ABNORMAL LOW (ref >90)
GLUCOSE: 79 mg/dL (ref 70–99)
Potassium: 4 mEq/L (ref 3.7–5.3)
SODIUM: 142 meq/L (ref 137–147)
Total Protein: 8 g/dL (ref 6.0–8.3)

## 2014-02-02 LAB — I-STAT TROPONIN, ED: Troponin i, poc: 0 ng/mL (ref 0.00–0.08)

## 2014-02-02 LAB — URINE MICROSCOPIC-ADD ON

## 2014-02-02 MED ORDER — SILDENAFIL CITRATE 20 MG PO TABS
80.0000 mg | ORAL_TABLET | Freq: Three times a day (TID) | ORAL | Status: DC
  Administered 2014-02-03 – 2014-02-08 (×18): 80 mg via ORAL
  Filled 2014-02-02 (×19): qty 4

## 2014-02-02 MED ORDER — METOPROLOL TARTRATE 12.5 MG HALF TABLET
12.5000 mg | ORAL_TABLET | Freq: Two times a day (BID) | ORAL | Status: DC
  Administered 2014-02-03 – 2014-02-04 (×3): 12.5 mg via ORAL
  Filled 2014-02-02 (×5): qty 1

## 2014-02-02 MED ORDER — ALBUTEROL SULFATE HFA 108 (90 BASE) MCG/ACT IN AERS: 2.0000 | INHALATION_SPRAY | Freq: Four times a day (QID) | RESPIRATORY_TRACT | Status: DC

## 2014-02-02 MED ORDER — ARIPIPRAZOLE 2 MG PO TABS
2.0000 mg | ORAL_TABLET | ORAL | Status: DC
  Administered 2014-02-03 – 2014-02-06 (×3): 2 mg via ORAL
  Filled 2014-02-02 (×5): qty 1

## 2014-02-02 MED ORDER — ASPIRIN EC 81 MG PO TBEC
81.0000 mg | DELAYED_RELEASE_TABLET | Freq: Every day | ORAL | Status: DC
  Administered 2014-02-03 – 2014-02-08 (×6): 81 mg via ORAL
  Filled 2014-02-02 (×6): qty 1

## 2014-02-02 MED ORDER — DOXYCYCLINE HYCLATE 100 MG PO TABS
100.0000 mg | ORAL_TABLET | Freq: Two times a day (BID) | ORAL | Status: DC
  Administered 2014-02-03 – 2014-02-08 (×12): 100 mg via ORAL
  Filled 2014-02-02 (×13): qty 1

## 2014-02-02 MED ORDER — HEPARIN SODIUM (PORCINE) 5000 UNIT/ML IJ SOLN
5000.0000 [IU] | Freq: Three times a day (TID) | INTRAMUSCULAR | Status: DC
  Administered 2014-02-03 – 2014-02-08 (×18): 5000 [IU] via SUBCUTANEOUS
  Filled 2014-02-02 (×21): qty 1

## 2014-02-02 MED ORDER — ONDANSETRON HCL 4 MG PO TABS
4.0000 mg | ORAL_TABLET | Freq: Four times a day (QID) | ORAL | Status: DC | PRN
Start: 2014-02-02 — End: 2014-02-08

## 2014-02-02 MED ORDER — PANTOPRAZOLE SODIUM 40 MG PO TBEC
80.0000 mg | DELAYED_RELEASE_TABLET | Freq: Every day | ORAL | Status: DC
  Administered 2014-02-03 – 2014-02-08 (×6): 80 mg via ORAL
  Filled 2014-02-02 (×7): qty 2

## 2014-02-02 MED ORDER — ALUM & MAG HYDROXIDE-SIMETH 200-200-20 MG/5ML PO SUSP: 30.0000 mL | Freq: Four times a day (QID) | ORAL | Status: DC | PRN

## 2014-02-02 MED ORDER — SODIUM CHLORIDE 0.9 % IJ SOLN
3.0000 mL | Freq: Two times a day (BID) | INTRAMUSCULAR | Status: DC
  Administered 2014-02-03 – 2014-02-07 (×5): 3 mL via INTRAVENOUS

## 2014-02-02 MED ORDER — SENNA 8.6 MG PO TABS
1.0000 | ORAL_TABLET | Freq: Two times a day (BID) | ORAL | Status: DC
  Administered 2014-02-03 – 2014-02-08 (×10): 8.6 mg via ORAL
  Filled 2014-02-02 (×14): qty 1

## 2014-02-02 MED ORDER — ACETAMINOPHEN 325 MG PO TABS
650.0000 mg | ORAL_TABLET | Freq: Four times a day (QID) | ORAL | Status: DC | PRN
Start: 2014-02-02 — End: 2014-02-08
  Administered 2014-02-03 – 2014-02-05 (×7): 650 mg via ORAL
  Filled 2014-02-02 (×8): qty 2

## 2014-02-02 MED ORDER — TREPROSTINIL 0.6 MG/ML IN SOLN
18.0000 ug | Freq: Four times a day (QID) | RESPIRATORY_TRACT | Status: DC
  Administered 2014-02-03 (×3): 18 ug via RESPIRATORY_TRACT
  Filled 2014-02-02 (×2): qty 0.03

## 2014-02-02 MED ORDER — FUROSEMIDE 10 MG/ML IJ SOLN
60.0000 mg | Freq: Once | INTRAMUSCULAR | Status: AC
  Administered 2014-02-02: 60 mg via INTRAVENOUS
  Filled 2014-02-02: qty 6

## 2014-02-02 MED ORDER — SODIUM CHLORIDE 0.9 % IV SOLN: 250.0000 mL | INTRAVENOUS | Status: DC | PRN

## 2014-02-02 MED ORDER — BUDESONIDE-FORMOTEROL FUMARATE 160-4.5 MCG/ACT IN AERO
1.0000 | INHALATION_SPRAY | Freq: Two times a day (BID) | RESPIRATORY_TRACT | Status: DC
  Administered 2014-02-03 – 2014-02-08 (×10): 1 via RESPIRATORY_TRACT
  Filled 2014-02-02 (×3): qty 6

## 2014-02-02 MED ORDER — SODIUM CHLORIDE 0.9 % IJ SOLN: 3.0000 mL | INTRAMUSCULAR | Status: DC | PRN

## 2014-02-02 MED ORDER — ALBUTEROL SULFATE (2.5 MG/3ML) 0.083% IN NEBU
2.5000 mg | INHALATION_SOLUTION | RESPIRATORY_TRACT | Status: DC | PRN
  Filled 2014-02-02: qty 3

## 2014-02-02 MED ORDER — FUROSEMIDE 10 MG/ML IJ SOLN
60.0000 mg | Freq: Two times a day (BID) | INTRAMUSCULAR | Status: DC
  Administered 2014-02-03: 60 mg via INTRAVENOUS
  Filled 2014-02-02 (×2): qty 6

## 2014-02-02 MED ORDER — ONDANSETRON HCL 4 MG/2ML IJ SOLN: 4.0000 mg | Freq: Four times a day (QID) | INTRAMUSCULAR | Status: DC | PRN

## 2014-02-02 MED ORDER — PREDNISONE 10 MG PO TABS
10.0000 mg | ORAL_TABLET | Freq: Every day | ORAL | Status: AC
  Administered 2014-02-03 – 2014-02-07 (×5): 10 mg via ORAL
  Filled 2014-02-02 (×5): qty 1

## 2014-02-02 MED ORDER — SODIUM CHLORIDE 0.9 % IJ SOLN
3.0000 mL | Freq: Two times a day (BID) | INTRAMUSCULAR | Status: DC
  Administered 2014-02-03 – 2014-02-08 (×11): 3 mL via INTRAVENOUS

## 2014-02-02 MED ORDER — PAROXETINE HCL 20 MG PO TABS
20.0000 mg | ORAL_TABLET | Freq: Every day | ORAL | Status: DC
  Administered 2014-02-03 – 2014-02-08 (×6): 20 mg via ORAL
  Filled 2014-02-02 (×6): qty 1

## 2014-02-02 MED ORDER — ALBUTEROL SULFATE (2.5 MG/3ML) 0.083% IN NEBU
2.5000 mg | INHALATION_SOLUTION | Freq: Four times a day (QID) | RESPIRATORY_TRACT | Status: DC
  Administered 2014-02-03 – 2014-02-08 (×24): 2.5 mg via RESPIRATORY_TRACT
  Filled 2014-02-02 (×23): qty 3

## 2014-02-02 MED ORDER — PREDNISONE 20 MG PO TABS
60.0000 mg | ORAL_TABLET | Freq: Once | ORAL | Status: AC
  Administered 2014-02-02: 60 mg via ORAL
  Filled 2014-02-02: qty 3

## 2014-02-02 MED ORDER — POLYETHYLENE GLYCOL 3350 17 G PO PACK
17.0000 g | PACK | Freq: Every day | ORAL | Status: DC | PRN
  Filled 2014-02-02: qty 1

## 2014-02-02 MED ORDER — PREDNISONE 50 MG PO TABS
60.0000 mg | ORAL_TABLET | Freq: Every day | ORAL | Status: DC
  Filled 2014-02-02: qty 1

## 2014-02-02 NOTE — ED Notes (Signed)
Patient transported to X-ray 

## 2014-02-02 NOTE — Telephone Encounter (Signed)
Attempted to call pt back but got no answer. Noted phone call through Dr. Agustina Caroli office from [facility] documenting pt had desats and was instructed to proceed to the ED by their office. Pt listed as currently in the ED at [facility] based on EPIC. Will defer to ED providers, for now. --CMS

## 2014-02-02 NOTE — H&P (Signed)
[facility] Admission History and Physical Service Pager: [phone]  Patient name: Rachel [name] Medical record number: [id number] Date of birth: 05/22/1960 Age: 53 y.o. Gender: female  Primary Care Provider: Christa See, MD Consultants: N/A Code Status: Full  Chief Complaint: Dyspnea  Assessment and Plan: Rachel [name] is a 53 y.o. female presenting with increased dyspnea since Sunday (3 days ago).  Patient's past medical history is significant for recent admission for HCAP, COPD,  pulmonary sarcoidosis, chronic prednisone use, HTN, Pulmonary hypertension, cardiomyopathy, CHF, SVT, MDD with  psychotic features hypoxia and home oxygen use.   Dyspnea: Patient presents with 3-4 days of increased dyspnea, especially with exertion. Echo from September 2015 with normal systolic function, ejection fraction 55-60%. Mildly dilated right ventricle. Pulmonary arteries with peak pressure of 54 mg Hg. patient with significant history of end-stage pulmonary sarcoidosis and recent taper off prednisone. Of note patient has psychotic disorder with high-dose steroids. Patient with recent increase in sputum production and cough, with history of COPD. In addition chest x-ray with Bilateral chronic interstitial lung disease with bilateral upper lobe interstitial fibrosis. Appearance is consistent with patient's history of sarcoidosis. Superimposed interstitial edema or pneumonitis secondary to an infectious or inflammatory etiology cannot be excluded.  BNP is elevated to approximately 1500, baseline approximately 500, patient with a history of missing Lasix doses and new orthopnea.  - Steroids 60 mg given in ED, 10 mg daily schedule due to history of psychosis on high-dose steroids - Doxycycline 100 mg twice a day, for presumed COPD exacerbation with increased sputum production cough - Continued home inhalers: Albuterol every 4 hours, Symbicort 1 puff twice a day - Albuterol every 2  hours when necessary - Lasix 60 mg IV given in emergency room, continue twice a day 2 doses - Repeat chest x-ray in a.m. - Foley catheter placement while using Lasix, due to patient continued desaturations of oxygen to mid 60s with use of bedpan   HTN/pulmonary HTN: Patient compliant with revatio, tyvaso, metoprolol and Lasix. She states she usually takes her Lasix, just forgot for 1 day. Peak pressure on echo in September 2015, pulmonary peak pressure 54 mg Hg - Continue pulmonary hypertension and hypertension medications. - Continue Lasix at higher doses for diuresis.  GERD: Continue Nexium  MDD w/ psychosis: Continue Abilify and Paxil, caution with high-dose steroids.  FEN/GI: Regular diet, SLIV Prophylaxis: Hep SQ  Disposition: admit to telemetry, discharge home pending improvement of dyspnea without desaturations in oxygen on home oxygen   History of Present Illness: Rachel [name] is a 53 y.o. female presenting with increased dyspnea since  Sunday (3 days ago).  Patient's past medical history is significant for recent admission for HCAP, COPD,  pulmonary sarcoidosis, chronic prednisone use, HTN, Pulmonary hypertension, cardiomyopathy, CHF, SVT, MDD with  psychotic features hypoxia and home oxygen use. She states she got ready to go to church on Sunday and could not breath well enough to get out of door. She felt " bad" Monday and Tuesday with body aches, sore throat, fatigue, cough with yellow/brown sputum production, which is not her normal. She also experienced 3 pillow orthopnea on Sunday, which she states is new for her. She also complains of mild edema of her LE, and fullness feeling in her chest. She reports she did forget 1 day of lasix.  She denies fever, chills, nausea or vomit. She does endorse one episode of loose stool Monday. She has received her flu shot this year. Patients admits  to not drinking well, due to fatigue and sleepy more. She has granddaughters that have had runny  noses and possible URI. She is a former smoker, quit 20 years ago.   Able to speak full sentences without desaturation, but not able to move around with out destat to 65. On 5-6 L oxygen at home, but is usually able to stay active around the house.  Dr. Lamonte Sakai started her on prednisone 5 mg (taper down)  for 3 weeks only, that stopped 10 days ago.  Review Of Systems: Per HPI with the following additions: NA Otherwise 12 point review of systems was performed and was unremarkable.  Patient Active Problem List   Diagnosis Date Noted  . Allergic rhinitis 01/07/2014  . HCAP (healthcare-associated pneumonia) 12/20/2013  . Acute on chronic respiratory failure with hypoxia 11/27/2013  . Hypoxia 11/25/2013  . Dyspnea 11/10/2013  . Spinal stenosis of lumbar region 10/27/2013  . Abnormal involuntary movements 10/26/2013  . Shortness of breath 10/24/2013  . Major depressive disorder with psychotic features 08/31/2013  . Loss of weight 08/12/2013  . SVT (supraventricular tachycardia) 08/05/2013  . Congestive heart failure 09/21/2008  . Secondary pulmonary hypertension 09/05/2008  . PULMONARY SARCOIDOSIS 05/03/2008  . ANXIETY 06/05/2006  . HYPERTENSION, BENIGN SYSTEMIC 06/05/2006  . CARDIOMYOPATHY, IDIOPATHIC 06/05/2006  . GASTROESOPHAGEAL REFLUX, NO ESOPHAGITIS 06/05/2006   Past Medical History: Past Medical History  Diagnosis Date  . GERD (gastroesophageal reflux disease)   . Hypertension   . 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 CHF (congestive heart failure)     Right side- Dr. Aundra Dubin ([facility])  . Pulmonary HTN   . Anxiety   . Cardiomyopathy   . NSVT (nonsustained ventricular tachycardia)     with syncope: St Jude ICD was implanted. Device now nearing ERI. Given end stage lung disease and normalization of LV function, the device will not be replaced and tachy therapies were turned off  . Chronic chest pain     LHC (08/2008) with no angiographic coronary diease  . Allergic rhinitis    . H/O: iron deficiency anemia   . Secondary amenorrhea     irregular menses  . Psychosis     with high dose steroids  . SVT (supraventricular tachycardia)     possible runs  . Hypokalemia     recurrent  . Rotator cuff sprain   . Chronic back pain    Past Surgical History: Past Surgical History  Procedure Laterality Date  . Cardiac catheterization  08/26/2008    5% EF with normal coronary arteries and normal wall motion  . Adenosine cardiolyte  03/31/2003    no ischemia/infarct; marked global LV hypekinesis; resting EF 22%  . Cardiac catheterization  03/31/2003    globally depressed LV fxn with EF 20%; idiopathic dilated cardiomyopathy  . Defibrillator placed  03/31/2003    St. Jude single chamber (Dr. Cristopher Peru)  . Pfts      FVC 2000 (56%) FEV1 1400 (47%), ratio 0.7; insignif response to bronchodilatior; stable from 1/05 - 9/05; PFTs: Mod restriction, FVC 1.9L, FEV1 1.6L, both 53% predicted; slt response to brochodilators 04/08/2002   Social History: History  Substance Use Topics  . Smoking status: Former Smoker -- 1.50 packs/day for 11 years    Types: Cigarettes    Quit date: 04/08/1992  . Smokeless tobacco: Never Used     Comment: 1ppd x 20 years  . Alcohol Use: Yes     Comment: remote history of alcohol abuse (  quit 1994)   Additional social history: none Please also refer to relevant sections of EMR.  Family History: Family History  Problem Relation Age of Onset  . Lung cancer Father   . Cancer Father     lung cancer  . Hypertension    . Diabetes Mother     border line  . Hypertension Mother   . Heart failure Mother    Allergies and Medications: Allergies  Allergen Reactions  . Latex Rash  . Nitroglycerin Other (See Comments)    Patient is on Revatio  . Other Other (See Comments)    High Dose Steroids cause chemical imbalance and altered mental status  . Meperidine Hcl Other (See Comments)    REACTION: Makes her feel funny  . Tramadol Hcl Other (See  Comments)    REACTION: nausea, lightheadedness, sleepiness, and dizziness  . Ace Inhibitors Other (See Comments)    unknown  . Morphine Other (See Comments)    Loopy, light headed feeling    . Propoxyphene N-Acetaminophen Rash   No current facility-administered medications on file prior to encounter.   Current Outpatient Prescriptions on File Prior to Encounter  Medication Sig Dispense Refill  . albuterol (PROAIR HFA) 108 (90 BASE) MCG/ACT inhaler Inhale 2 puffs into the lungs 4 (four) times daily.  18 g  11  . ARIPiprazole (ABILIFY) 2 MG tablet Take 1 tablet (2 mg total) by mouth every other day.  30 tablet  1  . aspirin EC 81 MG tablet Take 1 tablet (81 mg total) by mouth daily.  1 tablet  0  . benzonatate (TESSALON PERLES) 100 MG capsule Take 1 capsule (100 mg total) by mouth 2 (two) times daily as needed for cough.  30 capsule  0  . budesonide-formoterol (SYMBICORT) 160-4.5 MCG/ACT inhaler Inhale 1 puff into the lungs 2 (two) times daily.  1 Inhaler  3  . esomeprazole (NEXIUM) 40 MG capsule Take 1 capsule (40 mg total) by mouth every morning.  30 capsule  3  . furosemide (LASIX) 20 MG tablet Take 2 tablets (23m) every morning and take 1 tablet (267m every evening.  90 tablet  0  . metoprolol tartrate (LOPRESSOR) 12.5 mg TABS tablet Take 0.5 tablets (12.5 mg total) by mouth 2 (two) times daily.  60 tablet  0  . PARoxetine (PAXIL) 20 MG tablet Take 1 tablet (20 mg total) by mouth daily.  30 tablet  2  . potassium chloride SA (K-DUR,KLOR-CON) 20 MEQ tablet Take 1-2 tablets (20-40 mEq total) by mouth 2 (two) times daily. 40 meq in the morning and 20 meq at night  90 tablet  3  . predniSONE (DELTASONE) 5 MG tablet Take 1 tab once daily x 3 weeks then stop      . sildenafil (REVATIO) 20 MG tablet Take 4 tablets (80 mg total) by mouth 3 (three) times daily. take 4  tablets every 8 hours  10 tablet  0  . Treprostinil (TYVASO) 0.6 MG/ML SOLN Inhale 18 mcg into the lungs 4 (four) times daily.   3.6 mL  11    Objective: BP 106/75  Pulse 89  Temp(Src) 97.3 F (36.3 C) (Oral)  Resp 19  SpO2 97% Exam: General: African-American female, mild respiratory distress with recovery, nontoxic in appearance, well-developed, well-nourished. Patient on 6 L nasal cannula. HEENT: Atraumatic, normocephalic. Bilateral eyes without ejections. Moist mucous membranes. Cardiovascular: Regular rate and rhythm, no murmurs, clicks gallops or rubs appreciated. Intact distal pulses. Respiratory: Very fine crackles at  bilateral bases. Rhonchorous upper airway. No wheeze.  Abdomen: Soft, obese, nontender, nondistended, no rebound or guarding,  bowel sounds present, no masses palpated. Extremities: No erythema, trace edema bilateral lower extremity, nontender. +2/4 DP Skin: Warm and dry, no rashes noted. Neuro: PERRLA. EOMI. Alert, oriented. Cranial nerves II through XII intact. Muscle strength 5 out of 5 bilateral upper and lower extremity.  Labs and Imaging: CBC BMET   Recent Labs Lab 02/02/14 1541  WBC 9.3  HGB 10.4*  HCT 35.0*  PLT 325    Recent Labs Lab 02/02/14 1541  NA 142  K 4.0  CL 102  CO2 27  BUN 18  CREATININE 0.82  GLUCOSE 79  CALCIUM 9.1     BNP    Component Value Date/Time   PROBNP 1294.0* 02/02/2014 1553    Dg Chest 2 View  02/02/2014   CLINICAL DATA:  Chest pain with tightness  EXAM: CHEST  2 VIEW  COMPARISON:  05/27/2013, 12/20/2013  FINDINGS: There is bilateral severe chronic interstitial fibrosis predominantly involving the upper lobes. No focal consolidation. There is no pleural effusion. There is no pneumothorax. There is stable cardiomegaly. There is a single lead AICD.  The osseous structures are unremarkable.  IMPRESSION: 1. Bilateral chronic interstitial lung disease with bilateral upper lobe interstitial fibrosis. Appearance is consistent with patient's history of sarcoidosis. Superimposed interstitial edema or pneumonitis secondary to an infectious or  inflammatory etiology cannot be excluded.   Electronically Signed   By: Kathreen Devoid   On: 02/02/2014 17:06    Ma Hillock, DO 02/02/2014, 8:19 PM PGY-3, [place] [facility] pager: [phone], text pages welcome

## 2014-02-02 NOTE — ED Notes (Signed)
Attempted report 

## 2014-02-02 NOTE — ED Notes (Signed)
Admitting MD at bedside.

## 2014-02-02 NOTE — ED Provider Notes (Addendum)
CSN: [id number]     Arrival date & time 02/02/14  1523 History   First MD Initiated Contact with Patient 02/02/14 1528     Chief Complaint  Patient presents with  . Shortness of Breath  . Weakness     (Consider location/radiation/quality/duration/timing/severity/associated sxs/prior Treatment) Patient is a 53 y.o. female presenting with shortness of breath and weakness. The history is provided by the patient.  Shortness of Breath Severity:  Severe Onset quality:  Gradual Duration:  4 days Timing:  Constant Progression:  Worsening Chronicity:  Recurrent Context comment:  Started sunday when getting ready for church.  developed body aches, weakness and new productive cough  Relieved by:  Sitting up, rest and oxygen Worsened by:  Activity and coughing (lying flat) Ineffective treatments:  Rest Associated symptoms: cough, headaches, sputum production and wheezing   Associated symptoms: no abdominal pain, no chest pain, no diaphoresis, no fever and no vomiting   Associated symptoms comment:  Generalized weakness, chills and sweats.   Cough:    Cough characteristics:  Productive   Sputum characteristics:  Yellow   Severity:  Moderate   Onset quality:  Gradual   Duration:  4 days   Timing:  Constant   Progression:  Worsening Risk factors: no recent surgery and no tobacco use   Risk factors comment:  Stopped her prednisone last week and brother states due to pt feeling weak and tired she has missed some doses of medication Weakness Associated symptoms include headaches and shortness of breath. Pertinent negatives include no chest pain and no abdominal pain.    Past Medical History  Diagnosis Date  . GERD (gastroesophageal reflux disease)   . Hypertension   . 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 CHF (congestive heart failure)     Right side- Dr. Aundra Dubin ([facility])  . Pulmonary HTN   . Anxiety   . Cardiomyopathy   . NSVT (nonsustained ventricular tachycardia)     with  syncope: St Jude ICD was implanted. Device now nearing ERI. Given end stage lung disease and normalization of LV function, the device will not be replaced and tachy therapies were turned off  . Chronic chest pain     LHC (08/2008) with no angiographic coronary diease  . Allergic rhinitis   . H/O: iron deficiency anemia   . Secondary amenorrhea     irregular menses  . Psychosis     with high dose steroids  . SVT (supraventricular tachycardia)     possible runs  . Hypokalemia     recurrent  . Rotator cuff sprain   . Chronic back pain    Past Surgical History  Procedure Laterality Date  . Cardiac catheterization  08/26/2008    5% EF with normal coronary arteries and normal wall motion  . Adenosine cardiolyte  03/31/2003    no ischemia/infarct; marked global LV hypekinesis; resting EF 22%  . Cardiac catheterization  03/31/2003    globally depressed LV fxn with EF 20%; idiopathic dilated cardiomyopathy  . Defibrillator placed  03/31/2003    St. Jude single chamber (Dr. Cristopher Peru)  . Pfts      FVC 2000 (56%) FEV1 1400 (47%), ratio 0.7; insignif response to bronchodilatior; stable from 1/05 - 9/05; PFTs: Mod restriction, FVC 1.9L, FEV1 1.6L, both 53% predicted; slt response to brochodilators 04/08/2002   Family History  Problem Relation Age of Onset  . Lung cancer Father   . Cancer Father     lung cancer  .  Hypertension    . Diabetes Mother     border line  . Hypertension Mother   . Heart failure Mother    History  Substance Use Topics  . Smoking status: Former Smoker -- 1.50 packs/day for 11 years    Types: Cigarettes    Quit date: 04/08/1992  . Smokeless tobacco: Never Used     Comment: 1ppd x 20 years  . Alcohol Use: Yes     Comment: remote history of alcohol abuse (quit 1994)   OB History   Grav Para Term Preterm Abortions TAB SAB Ect Mult Living                 Review of Systems  Constitutional: Negative for fever and diaphoresis.  Respiratory: Positive for  cough, sputum production, shortness of breath and wheezing.   Cardiovascular: Negative for chest pain.  Gastrointestinal: Negative for vomiting and abdominal pain.  Neurological: Positive for weakness and headaches.  All other systems reviewed and are negative.     Allergies  Latex; Nitroglycerin; Other; Meperidine hcl; Tramadol hcl; Ace inhibitors; Morphine; and Propoxyphene n-acetaminophen  Home Medications   Prior to Admission medications   Medication Sig Start Date End Date Taking? Authorizing Provider  albuterol (PROAIR HFA) 108 (90 BASE) MCG/ACT inhaler Inhale 2 puffs into the lungs 4 (four) times daily. 12/07/13   Hoffman, MD  ARIPiprazole (ABILIFY) 2 MG tablet Take 1 tablet (2 mg total) by mouth every other day. 11/01/13   Paden, MD  aspirin EC 81 MG tablet Take 1 tablet (81 mg total) by mouth daily. 08/21/13   Waylan Boga, NP  benzonatate (TESSALON PERLES) 100 MG capsule Take 1 capsule (100 mg total) by mouth 2 (two) times daily as needed for cough. 01/17/14   Sharon Mt [address], MD  budesonide-formoterol [facility]) 160-4.5 MCG/ACT inhaler Inhale 1 puff into the lungs 2 (two) times daily. 11/03/13   Sharon [address], MD  esomeprazole (NEXIUM) 40 MG capsule Take 1 capsule (40 mg total) by mouth every morning. 01/17/14   [place], MD  furosemide (LASIX) 20 MG tablet Take 2 tablets (63m) every morning and take 1 tablet (229m every evening. 11/29/13   AsJanora NorlanderDO  loratadine (CLARITIN) 10 MG tablet Take 1 tablet (10 mg total) by mouth daily. 01/07/14   RoCollene GobbleMD  metoprolol tartrate (LOPRESSOR) 12.5 mg TABS tablet Take 0.5 tablets (12.5 mg total) by mouth 2 (two) times daily. 12/25/13   JeRosemarie AxMD  PARoxetine (PAXIL) 20 MG tablet Take 1 tablet (20 mg total) by mouth daily. 11/03/13   ChSharon Mttreet, MD  potassium chloride SA (K-DUR,KLOR-CON) 20 MEQ tablet Take 1-2 tablets (20-40 mEq total) by mouth 2 (two)  times daily. 40 meq in the morning and 20 meq at night 01/10/14   ChCounty LineMD  predniSONE (DELTASONE) 10 MG tablet Take 1 tablet (10 mg total) by mouth daily with breakfast. 12/25/13   JeRosemarie AxMD  predniSONE (DELTASONE) 5 MG tablet Take 1 tab once daily x 3 weeks then stop 01/07/14   RoCollene GobbleMD  sildenafil (REVATIO) 20 MG tablet Take 4 tablets (80 mg total) by mouth 3 (three) times daily. take 4  tablets every 8 hours 08/21/13   JaWaylan BogaNP  sodium chloride (OCEAN) 0.65 % SOLN nasal spray Place 1 spray into both nostrils as needed for congestion.    Historical Provider, MD  Treprostinil (TYVASO) 0.6 MG/ML  SOLN Inhale 18 mcg into the lungs 4 (four) times daily. 10/18/13   Larey Dresser, MD   BP 119/90  Pulse 82  Temp(Src) 97.3 F (36.3 C) (Oral)  Resp 18  SpO2 100% Physical Exam  Nursing note and vitals reviewed. Constitutional: She is oriented to person, place, and time. She appears well-developed and well-nourished. No distress.  HENT:  Head: Normocephalic and atraumatic.  Mouth/Throat: Oropharynx is clear and moist.  Eyes: Conjunctivae and EOM are normal. Pupils are equal, round, and reactive to light.  Neck: Normal range of motion. Neck supple.  Cardiovascular: Normal rate, regular rhythm and intact distal pulses.   No murmur heard. Pulmonary/Chest: Breath sounds normal. No accessory muscle usage. Tachypnea noted. No respiratory distress. She has no decreased breath sounds. She has no wheezes. She has no rales.  Fine crackles in all lung fields.  Pt is on non-rebreather and sating 100%  Abdominal: Soft. She exhibits no distension. There is no tenderness. There is no rebound and no guarding.  Musculoskeletal: Normal range of motion. She exhibits edema. She exhibits no tenderness.  Trace edema bilaterally  Neurological: She is alert and oriented to person, place, and time.  Skin: Skin is warm and dry. No rash noted. No erythema.  Psychiatric: She has a  normal mood and affect. Her behavior is normal.    ED Course  Procedures (including critical care time) Labs Review Labs Reviewed  CBC WITH DIFFERENTIAL - Abnormal; Notable for the following:    RBC 3.83 (*)    Hemoglobin 10.4 (*)    HCT 35.0 (*)    MCHC 29.7 (*)    Eosinophils Relative 7 (*)    All other components within normal limits  COMPREHENSIVE METABOLIC PANEL - Abnormal; Notable for the following:    Albumin 3.3 (*)    GFR calc non Af Amer 80 (*)    All other components within normal limits  URINALYSIS, ROUTINE W REFLEX MICROSCOPIC - Abnormal; Notable for the following:    Leukocytes, UA MODERATE (*)    All other components within normal limits  PRO B NATRIURETIC PEPTIDE - Abnormal; Notable for the following:    Pro B Natriuretic peptide (BNP) 1294.0 (*)    All other components within normal limits  URINE MICROSCOPIC-ADD ON - Abnormal; Notable for the following:    Squamous Epithelial / LPF FEW (*)    All other components within normal limits  I-STAT TROPOININ, ED  I-STAT CG4 LACTIC ACID, ED    Imaging Review Dg Chest 2 View  02/02/2014   CLINICAL DATA:  Chest pain with tightness  EXAM: CHEST  2 VIEW  COMPARISON:  05/27/2013, 12/20/2013  FINDINGS: There is bilateral severe chronic interstitial fibrosis predominantly involving the upper lobes. No focal consolidation. There is no pleural effusion. There is no pneumothorax. There is stable cardiomegaly. There is a single lead AICD.  The osseous structures are unremarkable.  IMPRESSION: 1. Bilateral chronic interstitial lung disease with bilateral upper lobe interstitial fibrosis. Appearance is consistent with patient's history of sarcoidosis. Superimposed interstitial edema or pneumonitis secondary to an infectious or inflammatory etiology cannot be excluded.   Electronically Signed   By: Kathreen Devoid   On: 02/02/2014 17:06     EKG Interpretation   Date/Time:  Wednesday February 02 2014 15:41:09 EDT Ventricular Rate:   83 PR Interval:  198 QRS Duration: 98 QT Interval:  461 QTC Calculation: 542 R Axis:   75 Text Interpretation:  Sinus rhythm Borderline repolarization abnormality  Prolonged  QT interval No significant change since last tracing Confirmed  by [facility]  MD, Loree Fee ([id number]) on 02/02/2014 3:54:41 PM      MDM   Final diagnoses:  SOB (shortness of breath)  Acute on chronic congestive heart failure, unspecified congestive heart failure type    Patient with a significant history of CHF, pulmonary hypertension, sarcoidosis, cardiomyopathy with a recent EF of 55% who was recently hospitalized approximately 1 month ago for HCAP who presents today with 4 days of worsening productive cough, body aches, chills and generalized weakness. She also notes new orthopnea. Patient does wear 5-6 L of oxygen at home but has been short of breath despite this.  Patient presented via EMS and they noted her oxygen saturations to be in the 80s on 6 L at home and she was placed on a nonrebreather that improved her oxygen saturation are 96%.  Patient is awake alert and able to give her history. It seems that she has forgotten to take a few doses of her Lasix because she's been weak in sleeping a lot. She has not used any albuterol today but has continued to use her chronic inhalers.  Concern for CHF versus new infectious etiology versus exacerbation of her sarcoidosis and lung disease as she recently stopped prednisone last week.  CBC, CMP, UA, troponin, lactic acid, BNP pending.  At this time patient is not wheezing so we'll continue oxygen and will reevaluate the patient.  5:12 PM Labs indicated elevated BNP and CXR with edema which is most likely a result of missing some lasix.  Most likely body aches and weakness is due to stopping prednisone.  Will admit for further care.   Blanchie Dessert, MD 02/02/14 1712  Blanchie Dessert, MD 02/02/14 4799

## 2014-02-02 NOTE — ED Notes (Signed)
Pt to ED from home c/o shortness of breath, body aches, and weakness since Sunday. Pt is on home oxygen at 6L; initial sats in low 80s. Reports productive cough with yellow phlegm; fluctuating weight gain/loss; and intermittent diaphoresis. Also c/o "burgundy colored bleeding." EMS BP 102/66; HR 92; sats 96% on 12L NRB

## 2014-02-02 NOTE — Telephone Encounter (Signed)
Called and spoke to [place] with [facility]. She states the pt is desaturating to the 60's and 70's, diminished lower lobes, 1+ pitting edema in BIL lower extremities, chest tightness, shallow breathing, increase in SOB. Pt is afebrile and is alert and oriented x 4. Spoke with Dr. Lamonte Sakai and informed him of pt status and was advised to go ahead and send pt to ED.   Informed Patty that pt will need to go to ED asap. Pt verbalized understanding and denied any further questions or concerns at this time.

## 2014-02-02 NOTE — ED Notes (Signed)
Pt taken off NRB, is on 6 L oxygen via oximator. MD made aware pt is 87-91%. This is pt baseline. Pt has unlabored breathing.

## 2014-02-02 NOTE — ED Notes (Signed)
MD at bedside. 

## 2014-02-02 NOTE — ED Notes (Signed)
Pt recently discharged in September for pna; was taking Levaquin and prednisone. Reports has not been taking prednisone since last week. C/o increased shortness of breath, worse when on exertion or lying, body aches, and headache.

## 2014-02-03 ENCOUNTER — Inpatient Hospital Stay (HOSPITAL_COMMUNITY): Payer: Medicare Other

## 2014-02-03 DIAGNOSIS — I10 Essential (primary) hypertension: Secondary | ICD-10-CM

## 2014-02-03 LAB — INFLUENZA PANEL BY PCR (TYPE A & B)
H1N1FLUPCR: NOT DETECTED
Influenza A By PCR: NEGATIVE
Influenza B By PCR: NEGATIVE

## 2014-02-03 LAB — COMPREHENSIVE METABOLIC PANEL
ALBUMIN: 3.2 g/dL — AB (ref 3.5–5.2)
ALK PHOS: 59 U/L (ref 39–117)
ALT: 11 U/L (ref 0–35)
AST: 11 U/L (ref 0–37)
Anion gap: 12 (ref 5–15)
BILIRUBIN TOTAL: 0.4 mg/dL (ref 0.3–1.2)
BUN: 17 mg/dL (ref 6–23)
CHLORIDE: 102 meq/L (ref 96–112)
CO2: 30 mEq/L (ref 19–32)
CREATININE: 0.74 mg/dL (ref 0.50–1.10)
Calcium: 9.4 mg/dL (ref 8.4–10.5)
GFR calc Af Amer: >90 mL/min (ref >90)
GFR calc non Af Amer: >90 mL/min (ref >90)
Glucose, Bld: 103 mg/dL — ABNORMAL HIGH (ref 70–99)
POTASSIUM: 4.3 meq/L (ref 3.7–5.3)
Sodium: 144 mEq/L (ref 137–147)
Total Protein: 7.6 g/dL (ref 6.0–8.3)

## 2014-02-03 LAB — CBC
HCT: 36.4 % (ref 36.0–46.0)
Hemoglobin: 11 g/dL — ABNORMAL LOW (ref 12.0–15.0)
MCH: 27.4 pg (ref 26.0–34.0)
MCHC: 30.2 g/dL (ref 30.0–36.0)
MCV: 90.8 fL (ref 78.0–100.0)
PLATELETS: 347 10*3/uL (ref 150–400)
RBC: 4.01 MIL/uL (ref 3.87–5.11)
RDW: 14.9 % (ref 11.5–15.5)
WBC: 6.1 10*3/uL (ref 4.0–10.5)

## 2014-02-03 LAB — TROPONIN I
Troponin I: <0.3 ng/mL (ref <0.30)
Troponin I: <0.3 ng/mL (ref <0.30)
Troponin I: <0.3 ng/mL (ref <0.30)

## 2014-02-03 MED ORDER — FUROSEMIDE 40 MG PO TABS
40.0000 mg | ORAL_TABLET | Freq: Two times a day (BID) | ORAL | Status: DC
  Administered 2014-02-04: 40 mg via ORAL
  Filled 2014-02-03 (×3): qty 1

## 2014-02-03 MED ORDER — FUROSEMIDE 10 MG/ML IJ SOLN
INTRAMUSCULAR | Status: AC
  Filled 2014-02-03: qty 4

## 2014-02-03 MED ORDER — TREPROSTINIL 0.6 MG/ML IN SOLN
18.0000 ug | Freq: Four times a day (QID) | RESPIRATORY_TRACT | Status: DC
  Administered 2014-02-03 – 2014-02-08 (×17): 18 ug via RESPIRATORY_TRACT
  Filled 2014-02-03 (×34): qty 0.03

## 2014-02-03 MED ORDER — FUROSEMIDE 40 MG PO TABS
40.0000 mg | ORAL_TABLET | Freq: Two times a day (BID) | ORAL | Status: DC
  Filled 2014-02-03 (×2): qty 1

## 2014-02-03 MED ORDER — SODIUM CHLORIDE 0.9 % IV BOLUS (SEPSIS)
250.0000 mL | Freq: Once | INTRAVENOUS | Status: AC
  Administered 2014-02-03: 250 mL via INTRAVENOUS

## 2014-02-03 MED ORDER — POLYETHYLENE GLYCOL 3350 17 G PO PACK
17.0000 g | PACK | Freq: Two times a day (BID) | ORAL | Status: DC
  Administered 2014-02-03: 17 g via ORAL
  Filled 2014-02-03 (×13): qty 1

## 2014-02-03 NOTE — Progress Notes (Signed)
BP recheck 100/48. Will continue to monitor.

## 2014-02-03 NOTE — Progress Notes (Signed)
Patient BP is 79/41. Pt continues to complain of a headache and feeling dizzy. MD notified. New orders given. Will continue to monitor patient for further changes in condition.

## 2014-02-03 NOTE — Progress Notes (Signed)
Family Medicine Teaching Service Daily Progress Note Intern Pager: [phone]  Patient name: Rachel [name] Medical record number: [id number] Date of birth: 1961/02/14 Age: 53 y.o. Gender: female  Primary Care Provider: Christa See, MD Consultants: none Code Status: full  Pt Overview and Major Events to Date:  10/28 - admit to [place] for dyspnea  Assessment and Plan:  Rachel [name] is a 53 y.o. female presenting with increased dyspnea since Sunday (3 days ago). Patient's past medical history is significant for recent admission for HCAP, COPD, pulmonary sarcoidosis, chronic prednisone use, HTN, Pulmonary hypertension, cardiomyopathy, CHF, SVT, MDD with psychotic features hypoxia and home oxygen use.   Dyspnea, CP: Most likely related to pulmonary edema given intermittent desats with rapid improvement.  BNP 1500 on admission (baseline ~500), +orthopnea, h/o missing doses of Lasix at home. Recent Echo (9/15) with EF 55-60%.  Patient also has recent increased sputum production, so being treated for COPD exacerbation as well. Must r/o ACS given CP. - F/u EKG and troponins - Continue Prednisone 67m daily (h/o psychosis on high-dose steroids) - Continue Doxycycline 100 mg BID - Continued home Albuterol q4h and Symbicort 1 puff BID  - Albuterol q2h prn  - Lasix 60 mg IV given in ED, continued twice a day 2 doses. Will transition to home PO Lasix 461mBID - f/u repeat CXR ordered for this AM  - Foley catheter placed while using Lasix, due to patient continued desats to mid 60s with use of bedpan - will dc today  HTN/pulmonary HTN: Patient compliant with revatio, tyvaso, metoprolol and Lasix. She states she usually takes her Lasix, just forgot for 1 day. Peak pressure on echo in September 2015, pulmonary peak pressure 54 mg Hg  - Continue home Revatio, Tyvaso, and Metoprolol.  - Continue Lasix as above for diuresis.   GERD: Stable, no complaints. Continue home Nexium   MDD w/ psychosis: Mood  stable. - Continue Abilify and Paxil, caution with high-dose steroids.   FEN/GI: Regular diet, SLIV  Prophylaxis: Hep SQ  Disposition: Pending symptomatic improvement  Subjective:  Patient reports ongoing muscle aches, but now has chest heaviness and substernal pressure.  Dyspnea improving.  Objective: Temp:  [97.3 F (36.3 C)-98.4 F (36.9 C)] 98.4 F (36.9 C) (10/29 0706) Pulse Rate:  [69-98] 73 (10/29 1023) Resp:  [17-38] 22 (10/29 0706) BP: (85-124)/(49-90) 85/49 mmHg (10/29 1023) SpO2:  [91 %-100 %] 92 % (10/29 0819) Weight:  [159 lb 2.8 oz (72.2 kg)-159 lb 9.8 oz (72.4 kg)] 159 lb 2.8 oz (72.2 kg) (10/29 0706) Physical Exam: General: Laying in bed in NAD. Patient on 6 L nasal cannula.  HEENT: NCAT, MMM, EOMI.  Cardiovascular: RRR, no m/r/g. Intact distal pulses.  Respiratory: CTAB. No wheeze/crackles.  Normal WOB Abdomen: Soft, obese, nontender, nondistended, no rebound or guarding, bowel sounds present, no masses palpated.  Extremities: No erythema, trace edema bilateral lower extremities. No calf tenderness  Skin: Warm and dry, no rashes noted.    Laboratory:  Recent Labs Lab 02/02/14 1541 02/03/14 0906  WBC 9.3 6.1  HGB 10.4* 11.0*  HCT 35.0* 36.4  PLT 325 347    Recent Labs Lab 02/02/14 1541 02/03/14 0620  NA 142 144  K 4.0 4.3  CL 102 102  CO2 27 30  BUN 18 17  CREATININE 0.82 0.74  CALCIUM 9.1 9.4  PROT 8.0 7.6  BILITOT 0.3 0.4  ALKPHOS 62 59  ALT 13 11  AST 14 11  GLUCOSE 79 103*  BNP (last 3 results)  Recent Labs  12/20/13 1143 01/05/14 1417 02/02/14 1553  PROBNP 1779.0* 549.1* 1294.0*    Imaging/Diagnostic Tests: CXR (10/28):  Bilateral chronic interstitial lung disease with bilateral upper lobe interstitial fibrosis. Appearance is consistent with patient's history of sarcoidosis. Superimposed interstitial edema or pneumonitis secondary to an infectious or inflammatory etiology cannot be excluded.   Lavon Paganini,  MD 02/03/2014, 11:03 AM PGY-1, [place] Intern pager: [phone], text pages welcome

## 2014-02-03 NOTE — Progress Notes (Addendum)
Report given to receiving RN. Patient in bed resting. No verbal complaints and no signs or symptoms of distress or discomfort noted. BP 106/57.

## 2014-02-03 NOTE — Progress Notes (Signed)
[facility]  Patient Status: Active (receiving services up to time of hospitalization)  [facility] is providing the following services: RN  If patient discharges after hours, please call [phone].   Rachel [name] 02/03/2014, 10:38 AM

## 2014-02-03 NOTE — Progress Notes (Signed)
Patient BP is 85/49 and has verbalized a complaint of a headache and dizziness. MD notified. No new orders given at this time. Three cups of water given will recheck BP. Will continue to monitor patient for further changes in condition.

## 2014-02-03 NOTE — H&P (Signed)
FMTS ATTENDING ADMISSION NOTE Rachel Eniola,MD I  have seen and examined this patient, reviewed their chart. I have discussed this patient with the resident. I agree with the resident's findings, assessment and care plan.  53 y/O F with PMX of HCAP, COPD, pulmonary sarcoidosis, HTN, Pulmonary hypertension, cardiomyopathy, CHF, SVT, MDD with psychotic features hypoxia and home oxygen use. Presented with SOB, cough and chest tightness  Which started 3 days, this is worse with ambulation and sometimes at rest, she denies any fever no sick contact. She does have some sputum production with coughing. In the last few days she has been having tightness and swelling of her hands and legs after she missed the dose of her Lasix. She had needed to use extra pillows as well. She is normally on 5-6 L/min of O2 at home and yet this did not help with her breathing. Since admission breathing has improved mostly at rest.  No current facility-administered medications on file prior to encounter.   Current Outpatient Prescriptions on File Prior to Encounter  Medication Sig Dispense Refill  . albuterol (PROAIR HFA) 108 (90 BASE) MCG/ACT inhaler Inhale 2 puffs into the lungs 4 (four) times daily.  18 g  11  . ARIPiprazole (ABILIFY) 2 MG tablet Take 1 tablet (2 mg total) by mouth every other day.  30 tablet  1  . aspirin EC 81 MG tablet Take 1 tablet (81 mg total) by mouth daily.  1 tablet  0  . benzonatate (TESSALON PERLES) 100 MG capsule Take 1 capsule (100 mg total) by mouth 2 (two) times daily as needed for cough.  30 capsule  0  . budesonide-formoterol (SYMBICORT) 160-4.5 MCG/ACT inhaler Inhale 1 puff into the lungs 2 (two) times daily.  1 Inhaler  3  . esomeprazole (NEXIUM) 40 MG capsule Take 1 capsule (40 mg total) by mouth every morning.  30 capsule  3  . furosemide (LASIX) 20 MG tablet Take 2 tablets (71m) every morning and take 1 tablet (226m every evening.  90 tablet  0  . metoprolol tartrate (LOPRESSOR)  12.5 mg TABS tablet Take 0.5 tablets (12.5 mg total) by mouth 2 (two) times daily.  60 tablet  0  . PARoxetine (PAXIL) 20 MG tablet Take 1 tablet (20 mg total) by mouth daily.  30 tablet  2  . potassium chloride SA (K-DUR,KLOR-CON) 20 MEQ tablet Take 1-2 tablets (20-40 mEq total) by mouth 2 (two) times daily. 40 meq in the morning and 20 meq at night  90 tablet  3  . predniSONE (DELTASONE) 5 MG tablet Take 1 tab once daily x 3 weeks then stop      . sildenafil (REVATIO) 20 MG tablet Take 4 tablets (80 mg total) by mouth 3 (three) times daily. take 4  tablets every 8 hours  10 tablet  0  . Treprostinil (TYVASO) 0.6 MG/ML SOLN Inhale 18 mcg into the lungs 4 (four) times daily.  3.6 mL  11   Past Medical History  Diagnosis Date  . GERD (gastroesophageal reflux disease)   . Hypertension   . 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 YoAnnamaria Boots. CHF (congestive heart failure)     Right side- Dr. McAundra DubinLabuer)  . Pulmonary HTN   . Anxiety   . Cardiomyopathy   . NSVT (nonsustained ventricular tachycardia)     with syncope: St Jude ICD was implanted. Device now nearing ERI. Given end stage lung disease and normalization of LV function, the device will  not be replaced and tachy therapies were turned off  . Chronic chest pain     LHC (08/2008) with no angiographic coronary diease  . Allergic rhinitis   . H/O: iron deficiency anemia   . Secondary amenorrhea     irregular menses  . Psychosis     with high dose steroids  . SVT (supraventricular tachycardia)     possible runs  . Hypokalemia     recurrent  . Rotator cuff sprain   . Chronic back pain   . COPD (chronic obstructive pulmonary disease)   . Shortness of breath    Filed Vitals:   02/03/14 0030 02/03/14 0706 02/03/14 0819 02/03/14 1023  BP:  104/62  85/49  Pulse:  69  73  Temp:  98.4 F (36.9 C)    TempSrc:  Oral    Resp:  22    Height:      Weight:  159 lb 2.8 oz (72.2 kg)    SpO2: 96% 94% 92%    Exam: Gen: Awake and alert, calm in bed  seated up right not in distress. HEENT: EOMI, PERRLA Resp: Airn entry equal but reduced b/l with fine expiratory wheeze. CV: S1 S2 normal, no murmur Abdomen: Soft, NT/ND, BS normal. Ext: No edema. Neuro: Grossly intact.  A/P; 53 y/O female with 1. Acute on chronic respiratory failure/Dyspnea: Mild diastolic CHF exacerbation vs COPD exacerbation    CXRay review with no acute findings.    ProBNP mildly elevated from baseline.    I agreed with IV lasix for now.    Monitor I&Os and daily weighing.    Albuterol prn and resume home regimen.    Restart home low dose steroid s/p 60 mg solumedrol in the ED ( No reaction to this so far).    Monitor O2 requirement and titrate as needed.  2. HTN: review home regimen and restart as appropriate.  Disposition: Monitor for improvement.

## 2014-02-03 NOTE — Progress Notes (Signed)
FMTS Attending Note Patient seen and examined by me this afternoon, discussed with resident team. Patient has responded to lasix administration with markedly negative fluid balance.  Also noted to have become hypotensive as well, with some response to small NS bolus.  She reports that her dyspnea has improved moderately with the diuresis.  She denies a history of VTE.  Has had L popliteal/leg swelling and tenderness for several months which is unchanged today; has had lower extremity doppler study in July 2015 which was negative for DVT and a CTA chest in Sept 2015 for concern for PE (also negative). Given her BP response to small IV bolus, would favor administration of IVF to correct her hypotension.  If BP not responding, to consider CTA to exclude PE as cause.  Hold all BP meds until normotensive.  Patient known to CHF Team as well (Dr Aundra Dubin); to involve them in her care. Dalbert Mayotte, MD

## 2014-02-03 NOTE — Care Management Note (Addendum)
  Page 2 of 2   02/08/2014     3:12:38 PM CARE MANAGEMENT NOTE 02/08/2014  Patient:  Rachel [name], Rachel [name]   Account Number:  [id number]  Date Initiated:  02/03/2014  Documentation initiated by:  [facility]  Subjective/Objective Assessment:   53 y/O F with PMX of HCAP, COPD, pulmonary sarcoidosis, HTN, Pulmonary hypertension, cardiomyopathy, CHF, SVT, MDD with psychotic features hypoxia and home oxygen use.//Home alone.     Action/Plan:   Monitor I&Os and daily weighing; Albuterol; steroids.//Access for disposition needs.   Anticipated DC Date:  02/06/2014   Anticipated DC Plan:  [place]  In-house referral  Clinical Social Worker  NA      DC Planning Services  CM consult      [facility] Choice  Resumption Of Svcs/PTA Provider  HOME HEALTH   Choice offered to / List presented to:  NA      DME agency  [place] arranged  HH-1 RN  [place] [facility]      [place].   Status of service:  Completed, signed off Medicare Important Message given?  YES (If response is "NO", the following Medicare IM given date fields will be blank) Date Medicare IM given:  02/07/2014 Medicare IM given by:  [facility] Date Additional Medicare IM given:   Additional Medicare IM given by:    Discharge Disposition:  [place]  Per UR Regulation:  Reviewed for med. necessity/level of care/duration of stay  If discussed at [place] of Stay Meetings, dates discussed:   02/07/2014    Comments:  Mariann Laster RN, BSN, [place], CCM  Nurse - Case Manager,  ([facility])  [phone]  02/08/2014 Hx/o sarcoidosis, pulm HTN, pulm fibrosis admitted with dyspnea Home DME:  Home O2 [facility]) Dispo Plan: HHS:  RN, PT (Resume with [facility] Butch Penny aware)  [place] ordered Transport home via [facility] at d/c (SW/Donna nofied)    02/07/14 [place], RN, BSN, [place] [phone] Pt states she will need [facility] transportation  home.  02/03/14 Camellia J. Clydene Laming, RN, BSN, [employer] [phone] Pt currently active with [facility] for RN services.  Resumption of care requested.  Janae Sauce, RN of [facility] notified.  Pt also receives DME oxygen through [place] and knows to have tank brought in from home for transport home.

## 2014-02-04 DIAGNOSIS — J9621 Acute and chronic respiratory failure with hypoxia: Secondary | ICD-10-CM

## 2014-02-04 DIAGNOSIS — I5033 Acute on chronic diastolic (congestive) heart failure: Secondary | ICD-10-CM

## 2014-02-04 DIAGNOSIS — R0600 Dyspnea, unspecified: Secondary | ICD-10-CM

## 2014-02-04 LAB — BASIC METABOLIC PANEL
ANION GAP: 10 (ref 5–15)
BUN: 22 mg/dL (ref 6–23)
CHLORIDE: 98 meq/L (ref 96–112)
CO2: 31 mEq/L (ref 19–32)
Calcium: 9.1 mg/dL (ref 8.4–10.5)
Creatinine, Ser: 0.96 mg/dL (ref 0.50–1.10)
GFR calc Af Amer: 77 mL/min — ABNORMAL LOW (ref >90)
GFR calc non Af Amer: 66 mL/min — ABNORMAL LOW (ref >90)
Glucose, Bld: 96 mg/dL (ref 70–99)
Potassium: 3.7 mEq/L (ref 3.7–5.3)
Sodium: 139 mEq/L (ref 137–147)

## 2014-02-04 LAB — CBC
HEMATOCRIT: 32.7 % — AB (ref 36.0–46.0)
HEMOGLOBIN: 10 g/dL — AB (ref 12.0–15.0)
MCH: 27.2 pg (ref 26.0–34.0)
MCHC: 30.6 g/dL (ref 30.0–36.0)
MCV: 89.1 fL (ref 78.0–100.0)
PLATELETS: 326 10*3/uL (ref 150–400)
RBC: 3.67 MIL/uL — AB (ref 3.87–5.11)
RDW: 15.1 % (ref 11.5–15.5)
WBC: 9.5 10*3/uL (ref 4.0–10.5)

## 2014-02-04 MED ORDER — FUROSEMIDE 20 MG PO TABS
20.0000 mg | ORAL_TABLET | Freq: Every day | ORAL | Status: DC
  Administered 2014-02-04 – 2014-02-05 (×2): 20 mg via ORAL
  Filled 2014-02-04 (×3): qty 1

## 2014-02-04 MED ORDER — METOPROLOL TARTRATE 12.5 MG HALF TABLET
12.5000 mg | ORAL_TABLET | Freq: Two times a day (BID) | ORAL | Status: DC
  Administered 2014-02-04: 12.5 mg via ORAL
  Filled 2014-02-04 (×3): qty 1

## 2014-02-04 MED ORDER — FUROSEMIDE 40 MG PO TABS
40.0000 mg | ORAL_TABLET | Freq: Every day | ORAL | Status: DC
  Administered 2014-02-05 – 2014-02-06 (×2): 40 mg via ORAL
  Filled 2014-02-04 (×2): qty 1

## 2014-02-04 NOTE — Progress Notes (Signed)
FMTS Attending Note  Patient seen and examined by me, discussed with resident team and I agree with Dr Gerarda Fraction' note for today.  Patient has diuresed nearly 3L since admission; she does not appear to be volume overloaded clinically. What is more concerning is the relatively abrupt drop in her blood pressure, coupled with her complaints of headaches with position changes. Her outpatient SBPs at office visits appear to run in the 120s-range. Plan to hold her antihypertensives for now, consider giving back some fluid.  She has had CTA to evaluate for possible PE as recently as September (negative) and she does not have a history of VTE events.  Given her tenuous fluid balance issues and now her relative hypotension, appreciate Cardiology thoughts (patient reports her primary cardiologist is Dr Aundra Dubin).  Would be interested in Cardiology's thoughts on cause of relative hypotension (is it the result of aggressive diuresis, or should we be looking for another cause?).  Dalbert Mayotte, MD

## 2014-02-04 NOTE — Consult Note (Signed)
Advanced Heart Failure Team History and Physical Note   Primary Physician: Dr. Venetia Maxon Primary Cardiologist:  Dr. 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 She is now on Revatio 80 mg tid and and inhaled Iloprost. She has been evaluated for heart/lung transplant at [facility] as an inpatient but needed to raise around $10,000 to continue evaluation and has decided against this for now. She has been seeing Dr. Lamonte Sakai for her sarcoid.   Has been admitted 5 times in the past 6 months for SOB. CTA chest in 9/15 showed honeycombing and fibrosis consistent with pulmonary sarcoidosis, no PE. Discharge weight 164 lbs.  Seen by Dr. Aundra Dubin 01/06/14 and he felt as if volume status stable and her weight was 165 lbs. Presented to the ED with increased SOB and CP. Reports missing a day of lasix. Weight was at baseline. Pertinent labs on admission were creatinine 0.82, K+ 4.0, Hgb 10.4, pro-BNP 1294 and Troponin < 0.30. Started on IV lasix and net negative diuresis currently -2.6 liters.   Feeling better. Breathing at baseline.   Review of Systems: [y] = yes, _0  = no   General: Weight gain _1 ; Weight loss _2 ; Anorexia _3 ; Fatigue [ Y]; Fever _4 ; Chills _5 ; Weakness _6   Cardiac: Chest pain/pressure [Y ]; Resting SOB [Y ]; Exertional SOB [Y ]; Orthopnea [ Y]; Pedal Edema [Y ]; Palpitations _7 ; Syncope _8 ; Presyncope _9 ; Paroxysmal nocturnal dyspnea_10   Pulmonary: Cough [Y ]; Wheezing_11 ; Hemoptysis_12 ; Sputum _13 ; Snoring _14   GI: Vomiting_15 ; Dysphagia_16 ; Melena_17 ; Hematochezia _18 ; Heartburn_19 ; Abdominal pain _20 ; Constipation _21 ; Diarrhea _22 ; BRBPR _23   GU: Hematuria_24 ; Dysuria _25 ; Nocturia_26   Vascular: Pain in legs with walking _27 ; Pain in feet with lying flat _28 ; Non-healing sores _29 ; Stroke _30 ; TIA _31 ; Slurred speech _32 ;  Neuro: Headaches_33 ; Vertigo_34 ;  Seizures_35 ; Paresthesias_36 ;Blurred vision _37 ; Diplopia _38 ; Vision changes _39   Ortho/Skin: Arthritis _40 ; Joint pain [ Y]; Muscle pain _41 ; Joint swelling _42 ; Back Pain _43 ; Rash _44   Psych: Depression[Y ]; Anxiety_45   Heme: Bleeding problems _46 ; Clotting disorders _47 ; Anemia _48   Endocrine: Diabetes _49 ; Thyroid dysfunction_50   Home Medications Prior to Admission medications   Medication Sig Start Date End Date Taking? Authorizing Provider  albuterol (PROAIR HFA) 108 (90 BASE) MCG/ACT inhaler Inhale 2 puffs into the lungs 4 (four) times daily. 12/07/13  Yes Sharon Mt Street, MD  ARIPiprazole (ABILIFY) 2 MG tablet Take 1 tablet (2 mg total) by mouth every other day. 11/01/13  Yes Sharon Mt Street, MD  aspirin EC 81 MG tablet Take 1 tablet (81 mg total) by mouth daily. 08/21/13  Yes Waylan Boga, NP  benzonatate (TESSALON PERLES) 100 MG capsule Take 1 capsule (100 mg total) by mouth 2 (two) times daily as needed for cough. 01/17/14  Yes Sharon Mt Street, MD  budesonide-formoterol [facility]) 160-4.5 MCG/ACT inhaler Inhale 1 puff into the lungs 2 (two) times daily. 11/03/13  Yes Sharon Mt Street, MD  esomeprazole (NEXIUM) 40 MG capsule Take 1 capsule (40 mg total) by mouth every morning. 01/17/14  Yes Sharon Mt Street, MD  furosemide (  LASIX) 20 MG tablet Take 2 tablets (56m) every morning and take 1 tablet (230m every evening. 11/29/13  Yes Ashly M Gottschalk, DO  metoprolol tartrate (LOPRESSOR) 12.5 mg TABS tablet Take 0.5 tablets (12.5 mg total) by mouth 2 (two) times daily. 12/25/13  Yes JeRosemarie AxMD  PARoxetine (PAXIL) 20 MG tablet Take 1 tablet (20 mg total) by mouth daily. 11/03/13  Yes ChSharon Mttreet, MD  potassium chloride SA (K-DUR,KLOR-CON) 20 MEQ tablet Take 1-2 tablets (20-40 mEq total) by mouth 2 (two) times daily. 40 meq in the morning and 20 meq at night 01/10/14  Yes ChSharon Mttreet, MD  predniSONE (DELTASONE) 5 MG tablet Take 1 tab once daily x  3 weeks then stop 01/07/14  Yes RoCollene GobbleMD  sildenafil (REVATIO) 20 MG tablet Take 4 tablets (80 mg total) by mouth 3 (three) times daily. take 4  tablets every 8 hours 08/21/13  Yes JaWaylan BogaNP  Treprostinil (TYVASO) 0.6 MG/ML SOLN Inhale 18 mcg into the lungs 4 (four) times daily. 10/18/13  Yes DaLarey DresserMD    Past Medical History: Past Medical History  Diagnosis Date  . GERD (gastroesophageal reflux disease)   . Hypertension   . 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 YoAnnamaria Boots. CHF (congestive heart failure)     Right side- Dr. McAundra DubinLabuer)  . Pulmonary HTN   . Anxiety   . Cardiomyopathy   . NSVT (nonsustained ventricular tachycardia)     with syncope: St Jude ICD was implanted. Device now nearing ERI. Given end stage lung disease and normalization of LV function, the device will not be replaced and tachy therapies were turned off  . Chronic chest pain     LHC (08/2008) with no angiographic coronary diease  . Allergic rhinitis   . H/O: iron deficiency anemia   . Secondary amenorrhea     irregular menses  . Psychosis     with high dose steroids  . SVT (supraventricular tachycardia)     possible runs  . Hypokalemia     recurrent  . Rotator cuff sprain   . Chronic back pain   . COPD (chronic obstructive pulmonary disease)   . Shortness of breath     Past Surgical History: Past Surgical History  Procedure Laterality Date  . Cardiac catheterization  08/26/2008    5% EF with normal coronary arteries and normal wall motion  . Adenosine cardiolyte  03/31/2003    no ischemia/infarct; marked global LV hypekinesis; resting EF 22%  . Cardiac catheterization  03/31/2003    globally depressed LV fxn with EF 20%; idiopathic dilated cardiomyopathy  . Defibrillator placed  03/31/2003    St. Jude single chamber (Dr. GrCristopher Peru . Pfts      FVC 2000 (56%) FEV1 1400 (47%), ratio 0.7; insignif response to bronchodilatior; stable from 1/05 - 9/05; PFTs: Mod restriction, FVC  1.9L, FEV1 1.6L, both 53% predicted; slt response to brochodilators 04/08/2002    Family History: Family History  Problem Relation Age of Onset  . Lung cancer Father   . Cancer Father     lung cancer  . Hypertension    . Diabetes Mother     border line  . Hypertension Mother   . Heart failure Mother     Social History: History   Social History  . Marital Status: Divorced    Spouse Name: N/A    Number of Children: N/A  . Years of  Education: N/A   Social History Main Topics  . Smoking status: Former Smoker -- 1.50 packs/day for 11 years    Types: Cigarettes    Quit date: 04/08/1992  . Smokeless tobacco: Never Used     Comment: 1ppd x 20 years  . Alcohol Use: No     Comment: remote history of alcohol abuse (quit 1994)  . Drug Use: No  . Sexual Activity: No   Other Topics Concern  . None   Social History Narrative  . None    Allergies:  Allergies  Allergen Reactions  . Latex Rash  . Nitroglycerin Other (See Comments)    Patient is on Revatio  . Other Other (See Comments)    High Dose Steroids cause chemical imbalance and altered mental status  . Meperidine Hcl Other (See Comments)    REACTION: Makes her feel funny  . Tramadol Hcl Other (See Comments)    REACTION: nausea, lightheadedness, sleepiness, and dizziness  . Ace Inhibitors Other (See Comments)    unknown  . Morphine Other (See Comments)    Loopy, light headed feeling    . Propoxyphene N-Acetaminophen Rash    Objective:    Vital Signs:   Temp:  [97.7 F (36.5 C)-98.6 F (37 C)] 97.7 F (36.5 C) (10/30 0333) Pulse Rate:  [56-95] 88 (10/30 1130) Resp:  [18-20] 18 (10/30 0333) BP: (95-111)/(44-60) 101/56 mmHg (10/30 1130) SpO2:  [92 %-96 %] 95 % (10/30 1200) Weight:  [162 lb 3.2 oz (73.573 kg)] 162 lb 3.2 oz (73.573 kg) (10/30 0333) Last BM Date: 02/04/14 Filed Weights   02/02/14 2053 02/03/14 0706 02/04/14 0333  Weight: 159 lb 9.8 oz (72.4 kg) 159 lb 2.8 oz (72.2 kg) 162 lb 3.2 oz (73.573  kg)    Physical Exam: General: Well-developed,well-nourished,in no acute distress; O2 applied via nasal cannula  Neck: Neck supple, JVP 7 cm. No masses, thyromegaly or abnormal cervical nodes.  Lungs: Crackles at bases bilaterally.  Heart: Non-displaced PMI, chest non-tender; regular rate and rhythm, S1, S2 without rubs or gallops. 1/6 systolic murmur LLSB. Loud P2. Carotid upstroke normal, no bruit. Pedals normal pulses. Trace ankle edema bilaterally.  Abdomen: Bowel sounds positive; abdomen soft and tender without masses, organomegaly, or hernias noted. No hepatosplenomegaly.  Extremities: No clubbing or cyanosis.  Neurologic: Alert and oriented x 3.  Psych: Normal affect.  Telemetry: NSR 70s  Labs: Basic Metabolic Panel:  Recent Labs Lab 02/02/14 1541 02/03/14 0620 02/04/14 0708  NA 142 144 139  K 4.0 4.3 3.7  CL 102 102 98  CO2 _0 GLUCOSE 79 103* 96  BUN _1 CREATININE 0.82 0.74 0.96  CALCIUM 9.1 9.4 9.1    Liver Function Tests:  Recent Labs Lab 02/02/14 1541 02/03/14 0620  AST 14 11  ALT 13 11  ALKPHOS 62 59  BILITOT 0.3 0.4  PROT 8.0 7.6  ALBUMIN 3.3* 3.2*   No results found for this basename: LIPASE, AMYLASE,  in the last 168 hours No results found for this basename: AMMONIA,  in the last 168 hours  CBC:  Recent Labs Lab 02/02/14 1541 02/03/14 0906 02/04/14 0355  WBC 9.3 6.1 9.5  NEUTROABS 5.9  --   --   HGB 10.4* 11.0* 10.0*  HCT 35.0* 36.4 32.7*  MCV 91.4 90.8 89.1  PLT 325 347 326    Cardiac Enzymes:  Recent Labs Lab 02/03/14 1119 02/03/14 1519 02/03/14 2138  TROPONINI <0.30 <0.30 <0.30    BNP:  BNP (last 3 results)  Recent Labs  12/20/13 1143 01/05/14 1417 02/02/14 1553  PROBNP 1779.0* 549.1* 1294.0*    CBG: No results found for this basename: GLUCAP,  in the last 168 hours  Coagulation Studies: No results found for this basename: LABPROT, INR,  in the last 72 hours  Other results: EKG: NSR low voltage  79 bpm  Imaging: X-ray Chest Pa And Lateral  02/03/2014   CLINICAL DATA:  Shortness of breath, pulmonary edema.  EXAM: CHEST  2 VIEW  COMPARISON:  02/02/2014  FINDINGS: Cardiomegaly again noted. Single lead cardiac pacemaker is unchanged in position. Again noted chronic interstitial prominence. Again noted significant fibrotic changes with chronic chronic honeycombing bilateral upper lobe. No definite superimposed infiltrate or pulmonary edema.  IMPRESSION: Stable chronic interstitial prominence and extensive fibrotic changes bilaterally. No definite superimposed infiltrate or pulmonary edema.   Electronically Signed   By: Lahoma Crocker M.D.   On: 02/03/2014 12:44   Dg Chest 2 View  02/02/2014   CLINICAL DATA:  Chest pain with tightness  EXAM: CHEST  2 VIEW  COMPARISON:  05/27/2013, 12/20/2013  FINDINGS: There is bilateral severe chronic interstitial fibrosis predominantly involving the upper lobes. No focal consolidation. There is no pleural effusion. There is no pneumothorax. There is stable cardiomegaly. There is a single lead AICD.  The osseous structures are unremarkable.  IMPRESSION: 1. Bilateral chronic interstitial lung disease with bilateral upper lobe interstitial fibrosis. Appearance is consistent with patient's history of sarcoidosis. Superimposed interstitial edema or pneumonitis secondary to an infectious or inflammatory etiology cannot be excluded.   Electronically Signed   By: Kathreen Devoid   On: 02/02/2014 17:06         Assessment:   1) A/C respiratory failure 2) pulmonary HTN - out of proportion to sarcoidosis 3) Sarcoidosis 4) Chronic diastolic HF  Plan/Discussion:    Unfortunately Ms. Moragne suffers from end stage lung disease from pulmonary sarcoidosis and has had multiple admissions in the past 6 months d/t this condition.   She appears to be euvolemic. Will increase lasix back to home dose 40 mg q am and 20 mg q pm. Renal function stable and will continue to follow.  May  be beneficial in repeating RHC to reassess hemodynamics but will discuss with Dr. Haroldine Laws.  Continue treprostinil QID, revatio and oxygen.    Maybe beneficial to get Palliative Care involved to discuss Goals of Care.   Length of Stay: 2 Rande Brunt NP-C 02/04/2014, 1:16 PM  Advanced Heart Failure Team Pager [phone] (M-F; [place])  Please contact [facility] for night-coverage after hours (4p -7a ) and weekends on [email or web address]  Patient seen and examined with Rachel Bame, NP. We discussed all aspects of the encounter. I agree with the assessment and plan as stated above.   Volume status now back to baseline. Agree with switching back to home diuretics. Can go home in am. No role for RHC at this point.   Rafik Koppel,MD 2:27 PM

## 2014-02-04 NOTE — Discharge Summary (Signed)
Family Medicine Teaching [facility] Discharge Summary  Patient name: Rachel [name] Medical record number: [id number] Date of birth: October 01, 1960 Age: 53 y.o. Gender: female Date of Admission: 02/02/2014  Date of Discharge: 02/08/14 Admitting Physician: Janit Pagan, MD  Primary Care Provider: Maryjean Ka, MD Consultants: Cards  Indication for Hospitalization: Dyspnea  Discharge Diagnoses/Problem List:  Patient Active Problem List   Diagnosis Date Noted  . Congestive heart disease   . SOB (shortness of breath)   . Palliative care encounter   . Appetite loss   . Acute on chronic diastolic congestive heart failure 02/04/2014  . Dyspnea on exertion 02/02/2014  . Allergic rhinitis 01/07/2014  . HCAP (healthcare-associated pneumonia) 12/20/2013  . Acute on chronic respiratory failure with hypoxia 11/27/2013  . Hypoxia 11/25/2013  . Dyspnea 11/10/2013  . Spinal stenosis of lumbar region 10/27/2013  . Abnormal involuntary movements 10/26/2013  . Shortness of breath 10/24/2013  . Major depressive disorder with psychotic features 08/31/2013  . Loss of weight 08/12/2013  . SVT (supraventricular tachycardia) 08/05/2013  . Congestive heart failure 09/21/2008  . Secondary pulmonary hypertension 09/05/2008  . PULMONARY SARCOIDOSIS 05/03/2008  . ANXIETY 06/05/2006  . HYPERTENSION, BENIGN SYSTEMIC 06/05/2006  . CARDIOMYOPATHY, IDIOPATHIC 06/05/2006  . GASTROESOPHAGEAL REFLUX, NO ESOPHAGITIS 06/05/2006   Disposition: Home  Discharge Condition: Stable  Brief Hospital Course:  Rachel [name] is a 53 y.o. Female who presented with increased dyspnea. Patient's past medical history is significant for recent admission for HCAP, COPD, pulmonary sarcoidosis, chronic prednisone use, HTN, Pulmonary hypertension, cardiomyopathy, CHF, SVT, MDD with psychotic features hypoxia and home oxygen use.   Dyspnea, CP: Most likely related to pulmonary edema given intermittent desats with rapid  improvement. BNP 1500 on admission (baseline ~500), +orthopnea, h/o missing doses of Lasix at home. Recent Echo (9/15) with EF 55-60%. Patient also had recent increased sputum production, so we treated for COPD exacerbation as well as fluid overload. She received prednisone 60mg  in the ED then 10mg  x 5 days and doxycycline.  Intitial CXR showed possible intersitial edema. Repeat CXR after diuresis with no definite superimposed infiltrate or pulmonary edema. Continued home Albuterol and Symbicort. Patient was given Lasix. Patient remained at her home oxygen requirement of 5-6L. ACS was done and ruled out given CP. EKG remained unchanged. Trops negative. Patient was afebrile and without leukocytosis.   HTN/pulmonary HTN: Peak pressure on echo in September 2015, pulmonary peak pressure 54 mg Hg. Patient compliant with revatio, tyvaso, metoprolol and Lasix at home. CXR with chronic interstitial disease and without edema or infiltrate. Patient with symptomatic hypotension  this hospital admission. Her home regimen of metoprolol was held from 10/31 until the duration of her stay.  Additionally, Lasix was held x 2 days and she was placed on NS @ 70cc/hr x 24hrs given the concerns that her symptoms were secondary to hypovolemia due to excessive diuresis.   GERD: Stable, no complaints. Continued home Nexium   MDD w/ psychosis: Mood stable. Continued Abilify and Paxil.   Issues for Follow Up:  1. Consider further conversations about quality of life and symptom management 2. Given symptomatic hypotension, held metoprolol on discharge, consider re-starting if BPs begin to improve. 3. Pt discharged with prednisone 5mg  daily- consider discontinuation if warranted, felt given the patient's chronic use and h/o pulmonary sarcoidosis, that we would continue a low dose until she followed up.  Significant Procedures: None  Significant Labs and Imaging:   Recent Labs Lab 02/05/14 0440 02/06/14 02/07/14 0428  WBC  8.3  8.4 7.7  HGB 9.7* 10.0* 9.4*  HCT 32.0* 33.2* 30.9*  PLT 315 300 295    Recent Labs Lab 02/02/14 1541 02/03/14 0620 02/04/14 0708 02/05/14 0440 02/06/14 02/07/14 0428  NA 142 144 139 143 136* 143  K 4.0 4.3 3.7 4.1 3.5* 3.9  CL 102 102 98 102 98 103  CO2 27 30 31 26 27  32  GLUCOSE 79 103* 96 80 129* 83  BUN 18 17 22 21 19 18   CREATININE 0.82 0.74 0.96 0.97 0.87 0.74  CALCIUM 9.1 9.4 9.1 9.0 8.9 8.9  ALKPHOS 62 59  --   --   --   --   AST 14 11  --   --   --   --   ALT 13 11  --   --   --   --   ALBUMIN 3.3* 3.2*  --   --   --   --     Results/Tests Pending at Time of Discharge: None  Discharge Medications:    Medication List    STOP taking these medications        metoprolol tartrate 12.5 mg Tabs tablet  Commonly known as:  LOPRESSOR      TAKE these medications        albuterol 108 (90 BASE) MCG/ACT inhaler  Commonly known as:  PROAIR HFA  Inhale 2 puffs into the lungs 4 (four) times daily.     ARIPiprazole 2 MG tablet  Commonly known as:  ABILIFY  Take 1 tablet (2 mg total) by mouth every other day.     aspirin EC 81 MG tablet  Take 1 tablet (81 mg total) by mouth daily.     benzonatate 100 MG capsule  Commonly known as:  TESSALON PERLES  Take 1 capsule (100 mg total) by mouth 2 (two) times daily as needed for cough.     budesonide-formoterol 160-4.5 MCG/ACT inhaler  Commonly known as:  SYMBICORT  Inhale 1 puff into the lungs 2 (two) times daily.     doxycycline 100 MG tablet  Commonly known as:  VIBRA-TABS  Take 1 tablet (100 mg total) by mouth every 12 (twelve) hours.     esomeprazole 40 MG capsule  Commonly known as:  NEXIUM  Take 1 capsule (40 mg total) by mouth every morning.     furosemide 20 MG tablet  Commonly known as:  LASIX  Take 2 tablets (40mg ) every morning and take 2 tablets (40mg ) every evening.     PARoxetine 20 MG tablet  Commonly known as:  PAXIL  Take 1 tablet (20 mg total) by mouth daily.     potassium chloride SA  20 MEQ tablet  Commonly known as:  K-DUR,KLOR-CON  Take 1-2 tablets (20-40 mEq total) by mouth 2 (two) times daily. 40 meq in the morning and 20 meq at night     predniSONE 5 MG tablet  Commonly known as:  DELTASONE  Take 1 tablet (5 mg total) by mouth daily with breakfast.     sildenafil 20 MG tablet  Commonly known as:  REVATIO  Take 4 tablets (80 mg total) by mouth 3 (three) times daily. take 4  tablets every 8 hours     Treprostinil 0.6 MG/ML Soln  Commonly known as:  TYVASO  Inhale 18 mcg into the lungs 4 (four) times daily.        Discharge Instructions: Please refer to Patient Instructions section of EMR for full details.  Patient was counseled  important signs and symptoms that should prompt return to medical care, changes in medications, dietary instructions, activity restrictions, and follow up appointments.   Follow-Up Appointments: Follow-up Information    Follow up with [facility].   Why:  RN   Contact information:   [address] [place] [address] [phone]       Follow up with [facility].   Why:  OXYGEN   Contact information:   [address] [place] [address] [phone]       Follow up with Felix Pacini, DO On 02/10/2014.   Specialty:  Family Medicine   Why:  @4 :15p for hospital follow-up   Contact information:   [address] [place] [address] [phone]       Follow up with [facility], NP On 02/14/2014.   Specialty:  Nurse Practitioner   Why:  at 10:15 am in the [facility] Code 5000-bring medications   Contact information:   [address] [place] [address] [phone]       Follow up with [facility].   Why:  Registered Nurse and Physical Therapy services to start within 24-48 hours of hospital discharge   Contact information:   [address] [place] [address] [phone]       Joanna Puff,  MD 02/08/2014, 10:17 PM PGY-1, [facility]

## 2014-02-04 NOTE — Progress Notes (Signed)
Family Medicine Teaching Service Daily Progress Note Intern Pager: [phone]  Patient name: Rachel [name] Medical record number: [id number] Date of birth: Dec 10, 1960 Age: 53 y.o. Gender: female  Primary Care Provider: Christa See, MD Consultants: none Code Status: full  Pt Overview and Major Events to Date:  10/28 - admit to [facility] for dyspnea  Assessment and Plan:  KEINA MUTCH is a 53 y.o. female presenting with increased dyspnea since Sunday (3 days ago). Patient's past medical history is significant for recent admission for HCAP, COPD, pulmonary sarcoidosis, chronic prednisone use, HTN, Pulmonary hypertension, cardiomyopathy, CHF, SVT, MDD with psychotic features hypoxia and home oxygen use.   Dyspnea, CP: Most likely related to pulmonary edema given intermittent desats with rapid improvement.  BNP 1500 on admission (baseline ~500), +orthopnea, h/o missing doses of Lasix at home. Recent Echo (9/15) with EF 55-60%.  Patient also has recent increased sputum production, so being treated for COPD exacerbation as well.  r/o ACS given CP. Patient does not appear fluid overloaded. Net 3L out. - EKG unchanged - troponins neg - Continue Prednisone 65m daily (h/o psychosis on high-dose steroids) - Continue Doxycycline 100 mg BID - Continued home Albuterol q4h and Symbicort 1 puff BID  - Albuterol q2h prn  - PO Lasix 411mBID -repeat CXR - with no definite superimposed infiltrate or pulmonary edema -patient still requiring 5-6L of oxygen at baseline.   HTN/pulmonary HTN: Patient compliant with revatio, tyvaso, metoprolol and Lasix. She states she usually takes her Lasix, just forgot for 1 day. Peak pressure on echo in September 2015, pulmonary peak pressure 54 mg Hg. Patient been having low blood pressures. This is new and acute in onset. -pressures have been consistently low here and in previous notes in our clinic. Consider holding meds vs reducing doses? -cards consulted; appreciate recs    - Continue home Revatio, Tyvaso, and Metoprolol.  - Continue Lasix 6045ms above for diuresis.   GERD: Stable, no complaints. Continue home Nexium   MDD w/ psychosis: Mood stable. - Continue Abilify and Paxil, caution with high-dose steroids.   FEN/GI: Regular diet, SLIV  Prophylaxis: Hep SQ  Disposition: Pending symptomatic improvement.  Subjective:  Patient reports ongoing muscle aches, but now has chest heaviness and substernal pressure.  Dyspnea improving. Patient does not feel she is ready to go back home yet due to continued feelings of dizziness and unsteadiness with position changes and low blood pressure.  Objective: Temp:  [97.7 F (36.5 C)-98.6 F (37 C)] 97.7 F (36.5 C) (10/30 0333) Pulse Rate:  [56-95] 56 (10/30 0333) Resp:  [18-20] 18 (10/30 0333) BP: (79-111)/(41-60) 111/44 mmHg (10/30 0333) SpO2:  [90 %-96 %] 93 % (10/30 0333) Weight:  [162 lb 3.2 oz (73.573 kg)] 162 lb 3.2 oz (73.573 kg) (10/30 0333976hysical Exam: General: Laying in bed in NAD. Patient on 6 L nasal cannula.  HEENT: NCAT, MMM, EOMI.  Cardiovascular: RRR, no m/r/g. Intact distal pulses.  Respiratory: CTAB. No wheeze/crackles.  Normal WOB Abdomen: Soft, nontender, nondistended, no rebound or guarding, bowel sounds present, no masses palpated.  Extremities: No erythema, trace edema bilateral lower extremities. No calf tenderness  Skin: Warm and dry, no rashes noted.    Intake/Output Summary (Last 24 hours) at 02/04/14 0801 Last data filed at 02/04/14 0735  Gross per 24 hour  Intake    600 ml  Output   1852 ml  Net  -1252 ml   Laboratory:  Recent Labs Lab 02/02/14 1541 02/03/14 0906 02/04/14  0355  WBC 9.3 6.1 9.5  HGB 10.4* 11.0* 10.0*  HCT 35.0* 36.4 32.7*  PLT 325 347 326    Recent Labs Lab 02/02/14 1541 02/03/14 0620  NA 142 144  K 4.0 4.3  CL 102 102  CO2 27 30  BUN 18 17  CREATININE 0.82 0.74  CALCIUM 9.1 9.4  PROT 8.0 7.6  BILITOT 0.3 0.4  ALKPHOS 62 59  ALT  13 11  AST 14 11  GLUCOSE 79 103*    BNP (last 3 results)  Recent Labs  12/20/13 1143 01/05/14 1417 02/02/14 1553  PROBNP 1779.0* 549.1* 1294.0*    Imaging/Diagnostic Tests: CXR (10/28):  Bilateral chronic interstitial lung disease with bilateral upper lobe interstitial fibrosis. Appearance is consistent with patient's history of sarcoidosis. Superimposed interstitial edema or pneumonitis secondary to an infectious or inflammatory etiology cannot be excluded.   Katheren Shams, DO 02/04/2014, 7:33 AM PGY-1, [facility] Intern pager: [phone], text pages welcome

## 2014-02-05 DIAGNOSIS — I272 Other secondary pulmonary hypertension: Secondary | ICD-10-CM

## 2014-02-05 DIAGNOSIS — I509 Heart failure, unspecified: Secondary | ICD-10-CM

## 2014-02-05 DIAGNOSIS — K219 Gastro-esophageal reflux disease without esophagitis: Secondary | ICD-10-CM

## 2014-02-05 DIAGNOSIS — R0602 Shortness of breath: Secondary | ICD-10-CM

## 2014-02-05 DIAGNOSIS — D869 Sarcoidosis, unspecified: Secondary | ICD-10-CM

## 2014-02-05 LAB — BASIC METABOLIC PANEL
Anion gap: 15 (ref 5–15)
BUN: 21 mg/dL (ref 6–23)
CO2: 26 mEq/L (ref 19–32)
Calcium: 9 mg/dL (ref 8.4–10.5)
Chloride: 102 mEq/L (ref 96–112)
Creatinine, Ser: 0.97 mg/dL (ref 0.50–1.10)
GFR calc Af Amer: 76 mL/min — ABNORMAL LOW (ref >90)
GFR, EST NON AFRICAN AMERICAN: 66 mL/min — AB (ref >90)
Glucose, Bld: 80 mg/dL (ref 70–99)
POTASSIUM: 4.1 meq/L (ref 3.7–5.3)
Sodium: 143 mEq/L (ref 137–147)

## 2014-02-05 LAB — CBC
HCT: 32 % — ABNORMAL LOW (ref 36.0–46.0)
Hemoglobin: 9.7 g/dL — ABNORMAL LOW (ref 12.0–15.0)
MCH: 27.5 pg (ref 26.0–34.0)
MCHC: 30.3 g/dL (ref 30.0–36.0)
MCV: 90.7 fL (ref 78.0–100.0)
Platelets: 315 10*3/uL (ref 150–400)
RBC: 3.53 MIL/uL — ABNORMAL LOW (ref 3.87–5.11)
RDW: 15.4 % (ref 11.5–15.5)
WBC: 8.3 10*3/uL (ref 4.0–10.5)

## 2014-02-05 MED ORDER — SODIUM CHLORIDE 0.9 % IV BOLUS (SEPSIS)
250.0000 mL | Freq: Once | INTRAVENOUS | Status: AC
  Administered 2014-02-05: 250 mL via INTRAVENOUS

## 2014-02-05 NOTE — Progress Notes (Addendum)
Patient Name: Rachel [name] Date of Encounter: 02/05/2014    SUBJECTIVE: Dizziness with sitting with vision disturbance. No dyspnea. Left subclavicular pain near and around the device site.  TELEMETRY:  Normal sinus rhythm.  Filed Vitals:   02/04/14 2030 02/04/14 2300 02/05/14 0500 02/05/14 0752  BP:  99/61 103/59   Pulse:  78 63   Temp:   98.2 F (36.8 C)   TempSrc:   Oral   Resp:      Height:      Weight:   163 lb 11.2 oz (74.254 kg)   SpO2: 93%  98% 97%    Intake/Output Summary (Last 24 hours) at 02/05/14 0914 Last data filed at 02/05/14 0900  Gross per 24 hour  Intake    840 ml  Output    352 ml  Net    488 ml   LABS: Basic Metabolic Panel:  Recent Labs  02/04/14 0708 02/05/14 0440  NA 139 143  K 3.7 4.1  CL 98 102  CO2 31 26  GLUCOSE 96 80  BUN 22 21  CREATININE 0.96 0.97  CALCIUM 9.1 9.0   CBC:  Recent Labs  02/02/14 1541  02/04/14 0355 02/05/14 0440  WBC 9.3  < > 9.5 8.3  NEUTROABS 5.9  --   --   --   HGB 10.4*  < > 10.0* 9.7*  HCT 35.0*  < > 32.7* 32.0*  MCV 91.4  < > 89.1 90.7  PLT 325  < > 326 315  < > = values in this interval not displayed. Cardiac Enzymes:  Recent Labs  02/03/14 1119 02/03/14 1519 02/03/14 2138  TROPONINI <0.30 <0.30 <0.30    Radiology/Studies:  No new data  Physical Exam: Blood pressure 103/59, pulse 63, temperature 98.2 F (36.8 C), temperature source Oral, resp. rate 18, height _0  (1.6 m), weight 163 lb 11.2 oz (74.254 kg), SpO2 97.00%. Weight change: 4 lb 8.5 oz (2.054 kg)  Wt Readings from Last 3 Encounters:  02/05/14 163 lb 11.2 oz (74.254 kg)  01/17/14 163 lb (73.936 kg)  01/07/14 166 lb 9.6 oz (75.569 kg)    Lying on her right side. Denies dyspnea. Chest is clear. Mild tenderness at the lateral border of her defibrillator/pacemaker device. No erythema, fluctuance, or drainage. Extremities no edema Unremarkable  ASSESSMENT:  1. 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 Acute on chronic diastolic heart failure, resolved, and possibly mildly volume contracted 3. Left subclavicular discomfort related to irritation from device. No evidence of infection  Plan:  Orthostatic blood pressures Eligible for discharge later today on her standard diuretic regimen assuming she is ambulatory and without severe orthostasis.  Valerie Roys W 02/05/2014, 9:14 AM

## 2014-02-05 NOTE — Progress Notes (Signed)
Family Medicine Teaching Service Daily Progress Note Intern Pager: [phone]  Patient name: Rachel [name] Medical record number: [id number] Date of birth: 1960-05-10 Age: 53 y.o. Gender: female  Primary Care Provider: Christa See, MD Consultants: none Code Status: full  Pt Overview and Major Events to Date:  10/28 - admit to [facility] for dyspnea 10/29-10/30 - improved breathing with diuresis but relative hypotension (~22'L-798'X systolic), symptomatic 21/19 - similar relative hypotension, but overall feeling some better; slight drop in Hb  Assessment and Plan:  Rachel [name] is a 53 y.o. female presenting with increased dyspnea since Sunday (3 days ago). Patient's past medical history is significant for recent admission for HCAP, COPD, pulmonary sarcoidosis, chronic prednisone use, HTN, Pulmonary hypertension, cardiomyopathy, CHF, SVT, MDD with psychotic features hypoxia and home oxygen use.   Relative hypotension / headache - concern for mild orthostatis s/p diuresis, with headache and "seeing stars" after sitting up, that resolves after a little time - holding metoprolol this morning and bolus 250 mL, then monitor through the day - recommended OOB / up as tolerated to pt to see how she does through the day today 10/31  Dyspnea, CP: patient still requiring 5-6L of oxygen at baseline but overall breathing improved. Most likely related to pulmonary edema given intermittent desats with rapid improvement after diuresis.  BNP 1500 on admission (baseline ~500), +orthopnea, h/o missing doses of Lasix at home. Recent Echo (9/15) with EF 55-60%.  Patient also has recent increased sputum production, so being treated for COPD exacerbation as well.   - EKG unchanged - troponins neg - Continue Prednisone 6m daily (h/o psychosis on high-dose steroids) - likely continue at discharge, then f/u closely with pulm - Continue Doxycycline 100 mg BID for total 7 days (10/31 is day 3) - Continued home Albuterol  q4h and Symbicort 1 puff BID  - Albuterol q2h prn  - PO Lasix 40 mg q AM and 20 mg qPM (home dose) -repeat CXR - with no definite superimposed infiltrate or pulmonary edema  HTN/pulmonary HTN: Patient compliant with revatio, tyvaso, metoprolol and Lasix. She states she usually takes her Lasix, just forgot for 1 day. Peak pressure on echo in September 2015, pulmonary peak pressure 54 mg Hg. Patient been having low blood pressures. This is new and acute in onset. -pressures have been consistently low here and in previous notes in our clinic, with orthostatic-type symptoms -cards consulted, appreciate assistance; recommending home Lasix dose 40 mg / 20 mg - Continue home Revatio, Tyvaso, and Metoprolol.  - will need outpt pulm follow-up  Chronic anemia: likely related to chronic disease and heart failure, with slight drop from baseline and "minor" hemoptysis per pt, but this may be due to airway irritation from recent coughing as symptoms / appearance don't suggest other serious etiology - monitor CBC, and consider further work-up if symptoms worsen or if Hb precipitously drops, etc  GERD: Stable, no complaints. Continue home Nexium   MDD w/ psychosis: Mood stable. - Continue Abilify and Paxil, caution with high-dose steroids.   FEN/GI: Regular diet, small bolus as above then SLIV  Prophylaxis: Hep SQ  Disposition: management as above, possible D/C home later today vs 11/1  Subjective: Patient reports her breathing is better / at baseline. She still has feelings of dizziness and unsteadiness with position changes, especially with sitting up, that gets better after a few minutes, but is also still having significant headache with this. Appetite is still only marginal, even with prednisone, which pt states is "  unusual" for her. She does state she has had some red streaking in her cough, "sometimes," but not actively and "not much and not every day," to the point that she states "I didn't think it  was worth mentioning." She has no pleuritic chest pain, night sweats.  Objective: Temp:  [97.6 F (36.4 C)-98.6 F (37 C)] 98.2 F (36.8 C) (10/31 0500) Pulse Rate:  [63-88] 63 (10/31 0500) Resp:  [18] 18 (10/30 2004) BP: (95-103)/(52-61) 103/59 mmHg (10/31 0500) SpO2:  [90 %-98 %] 97 % (10/31 0752) Weight:  [163 lb 11.2 oz (74.254 kg)] 163 lb 11.2 oz (74.254 kg) (10/31 0500) Physical Exam: General: Laying in bed in NAD. Patient on 5-6 L nasal cannula.  Cardiovascular: RRR, no m/r/g. Intact distal pulses.  Respiratory: CTAB. No wheeze/crackles.  Normal WOB Abdomen: Soft, nontender, nondistended, no rebound, BS+ Extremities: No erythema, trace edema bilateral lower extremities. No calf tenderness  Skin: Warm and dry, no rashes noted.    Intake/Output Summary (Last 24 hours) at 02/05/14 0916 Last data filed at 02/05/14 0900  Gross per 24 hour  Intake    840 ml  Output    352 ml  Net    488 ml   Laboratory:  Recent Labs Lab 02/03/14 0906 02/04/14 0355 02/05/14 0440  WBC 6.1 9.5 8.3  HGB 11.0* 10.0* 9.7*  HCT 36.4 32.7* 32.0*  PLT 347 326 315    Recent Labs Lab 02/02/14 1541 02/03/14 0620 02/04/14 0708 02/05/14 0440  NA 142 144 139 143  K 4.0 4.3 3.7 4.1  CL 102 102 98 102  CO2 _0 BUN _1 CREATININE 0.82 0.74 0.96 0.97  CALCIUM 9.1 9.4 9.1 9.0  PROT 8.0 7.6  --   --   BILITOT 0.3 0.4  --   --   ALKPHOS 62 59  --   --   ALT 13 11  --   --   AST 14 11  --   --   GLUCOSE 79 103* 96 80    BNP (last 3 results)  Recent Labs  12/20/13 1143 01/05/14 1417 02/02/14 1553  PROBNP 1779.0* 549.1* 1294.0*    Recent Labs Lab 02/03/14 1119 02/03/14 1519 02/03/14 2138  TROPONINI <0.30 <0.30 <0.30   Imaging/Diagnostic Tests: CXR (10/28):  Bilateral chronic interstitial lung disease with bilateral upper lobe interstitial fibrosis. Appearance is consistent with patient's history of sarcoidosis. Superimposed interstitial edema or pneumonitis  secondary to an infectious or inflammatory etiology cannot be excluded.   Emmaline Kluver, MD 02/05/2014, 9:16 AM PGY-3, [facility] Intern pager: [phone], text pages welcome

## 2014-02-05 NOTE — Progress Notes (Signed)
FMTS Attending Note Patient seen and examined by me, discussed with Dr Venetia Maxon and I agree with his plan for today.  Patient reports the heaviness in her chest is improved. Still 'sees stars' when she sits up quickly, normalizes quickly. Some HA this morning (again, positional).  Given the repeated low SBPs (baseline outpatient is in the 120-range, here consistently 90s), plan to give a small amount NS (250cc) and watch for symptomatic improvement.  She is significantly negative in her fluid balance for this admission, and she is surprised by her lack of appetite despite the low-dose prednisone she is receiving (36m daily).  She is amenable to the idea of discharge pending improvement in her clinical status (defined by HA and visual changes with position change).  To discharge on her BB.  Will require close follow up after discharge.  JDalbert Mayotte MD

## 2014-02-06 DIAGNOSIS — Z515 Encounter for palliative care: Secondary | ICD-10-CM | POA: Insufficient documentation

## 2014-02-06 DIAGNOSIS — R630 Anorexia: Secondary | ICD-10-CM

## 2014-02-06 LAB — CBC
HCT: 33.2 % — ABNORMAL LOW (ref 36.0–46.0)
HEMOGLOBIN: 10 g/dL — AB (ref 12.0–15.0)
MCH: 27.5 pg (ref 26.0–34.0)
MCHC: 30.1 g/dL (ref 30.0–36.0)
MCV: 91.2 fL (ref 78.0–100.0)
PLATELETS: 300 10*3/uL (ref 150–400)
RBC: 3.64 MIL/uL — AB (ref 3.87–5.11)
RDW: 15.2 % (ref 11.5–15.5)
WBC: 8.4 10*3/uL (ref 4.0–10.5)

## 2014-02-06 LAB — BASIC METABOLIC PANEL
Anion gap: 11 (ref 5–15)
BUN: 19 mg/dL (ref 6–23)
CALCIUM: 8.9 mg/dL (ref 8.4–10.5)
CO2: 27 meq/L (ref 19–32)
CREATININE: 0.87 mg/dL (ref 0.50–1.10)
Chloride: 98 mEq/L (ref 96–112)
GFR calc Af Amer: 87 mL/min — ABNORMAL LOW (ref >90)
GFR calc non Af Amer: 75 mL/min — ABNORMAL LOW (ref >90)
GLUCOSE: 129 mg/dL — AB (ref 70–99)
Potassium: 3.5 mEq/L — ABNORMAL LOW (ref 3.7–5.3)
Sodium: 136 mEq/L — ABNORMAL LOW (ref 137–147)

## 2014-02-06 MED ORDER — POTASSIUM CHLORIDE CRYS ER 20 MEQ PO TBCR
40.0000 meq | EXTENDED_RELEASE_TABLET | Freq: Once | ORAL | Status: AC
  Administered 2014-02-06: 40 meq via ORAL
  Filled 2014-02-06: qty 2

## 2014-02-06 NOTE — Plan of Care (Signed)
Problem: Phase I Progression Outcomes Goal: Pain controlled with appropriate interventions Outcome: Completed/Met Date Met:  02/06/14     

## 2014-02-06 NOTE — Progress Notes (Signed)
Family Medicine Teaching Service Daily Progress Note Intern Pager: [phone]  Patient name: Rachel [name] Medical record number: [id number] Date of birth: August 04, 1960 Age: 53 y.o. Gender: female  Primary Care Provider: Christa See, MD Consultants: none Code Status: full  Pt Overview and Major Events to Date:  10/28 - admit to [facility] for dyspnea 10/29-10/30 - improved breathing with diuresis but relative hypotension (~93'O-671'I systolic), symptomatic 45/80 - similar relative hypotension, but overall feeling some better; slight drop in Hb  Assessment and Plan:  Rachel [name] is a 53 y.o. female presenting with increased dyspnea since Sunday (3 days ago). Patient's past medical history is significant for recent admission for HCAP, COPD, pulmonary sarcoidosis, chronic prednisone use, HTN, Pulmonary hypertension, cardiomyopathy, CHF, SVT, MDD with psychotic features hypoxia and home oxygen use.   Relative hypotension / headache - concern for mild orthostatis s/p diuresis, with headache and "seeing stars" after sitting up, that resolves after a little time - holding metoprolol for now, discussed with cardiology on the phone today who recommended holding home diuretics today as this is likely volume related. Orthostatics negative. Recommended against midodrine. - recommended OOB / up as tolerated, PT/OT consult today  Dyspnea, CP: patient still requiring 5-6L of oxygen at baseline but overall breathing improved. Most likely related to pulmonary edema given intermittent desats with rapid improvement after diuresis.  BNP 1500 on admission (baseline ~500), +orthopnea, h/o missing doses of Lasix at home. Recent Echo (9/15) with EF 55-60%.  Patient also has recent increased sputum production, so being treated for COPD exacerbation as well.  EKG unchanged and troponins negative. Repeat CXR with no definite superimposed infiltrate or pulmonary edema - Continue Prednisone 80m daily (h/o psychosis on  high-dose steroids) - likely continue at discharge, then f/u closely with pulm - Continue Doxycycline 100 mg BID for total 7 days (11/1 is day 4) - Continued home Albuterol q4h and Symbicort 1 puff BID, Albuterol q2h prn  - PO Lasix 40 mg q AM and 20 mg qPM (home dose) - HOLDING today due to symptomatic hypotension per phone call with Dr. STamala Julian- repleted potassium this AM with 443m PO  HTN/pulmonary HTN: Patient compliant with revatio, tyvaso, metoprolol and Lasix. She states she usually takes her Lasix, just forgot for 1 day. Peak pressure on echo in September 2015, pulmonary peak pressure 54 mg Hg. Patient been having low blood pressures. This is new and acute in onset. Pressures have been consistently low here and in previous notes in our clinic, with orthostatic-type symptoms - cards consulted, appreciate assistance; recommending home Lasix dose 40 mg / 20 mg (holding today, see above) - Continue home Revatio, Tyvaso, and Metoprolol.  - will need outpt pulm follow-up  Chronic anemia: likely related to chronic disease and heart failure, with slight drop from baseline and "minor" hemoptysis per pt, but this may be due to airway irritation from recent coughing as symptoms / appearance don't suggest other serious etiology - monitor CBC, and consider further work-up if symptoms worsen or if Hb precipitously drops, etc  GERD: Stable, no complaints. Continue home Nexium   MDD w/ psychosis: Mood stable. - Continue Abilify and Paxil, caution with high-dose steroids.   FEN/GI: Regular diet, SLIV  Prophylaxis: Hep SQ  Disposition: management as above, PT/OT consult today. Likely d/c 11/2  Subjective: Patient reports her breathing is better. Still having some vertiginous symptoms and seeing spots when she changes positions or stands upright. Chest discomfort also improved.  Objective: Temp:  [97.6 F (  36.4 C)-98.3 F (36.8 C)] 97.6 F (36.4 C) (11/01 0435) Pulse Rate:  [62-71] 62 (11/01  0435) Resp:  [18-20] 18 (11/01 0435) BP: (89-113)/(47-71) 96/47 mmHg (11/01 0435) SpO2:  [91 %-100 %] 91 % (11/01 0824) Weight:  [162 lb 3.2 oz (73.573 kg)] 162 lb 3.2 oz (73.573 kg) (11/01 0435) Physical Exam: General: Sitting up in bed in NAD. Patient on 5-6 L nasal cannula.  Cardiovascular: RRR, no m/r/g. Respiratory: CTAB. No wheeze/crackles.  Normal WOB Skin: Warm and dry, no rashes noted.  Neuro: grossly nonfocal, speech normal   Intake/Output Summary (Last 24 hours) at 02/06/14 0836 Last data filed at 02/06/14 0150  Gross per 24 hour  Intake    960 ml  Output   1350 ml  Net   -390 ml   Laboratory:  Recent Labs Lab 02/04/14 0355 02/05/14 0440 02/06/14  WBC 9.5 8.3 8.4  HGB 10.0* 9.7* 10.0*  HCT 32.7* 32.0* 33.2*  PLT 326 315 300    Recent Labs Lab 02/02/14 1541 02/03/14 0620 02/04/14 0708 02/05/14 0440 02/06/14  NA 142 144 139 143 136*  K 4.0 4.3 3.7 4.1 3.5*  CL 102 102 98 102 98  CO2 _0 BUN _1 CREATININE 0.82 0.74 0.96 0.97 0.87  CALCIUM 9.1 9.4 9.1 9.0 8.9  PROT 8.0 7.6  --   --   --   BILITOT 0.3 0.4  --   --   --   ALKPHOS 62 59  --   --   --   ALT 13 11  --   --   --   AST 14 11  --   --   --   GLUCOSE 79 103* 96 80 129*    BNP (last 3 results)  Recent Labs  12/20/13 1143 01/05/14 1417 02/02/14 1553  PROBNP 1779.0* 549.1* 1294.0*    Recent Labs Lab 02/03/14 1119 02/03/14 1519 02/03/14 2138  TROPONINI <0.30 <0.30 <0.30   Imaging/Diagnostic Tests: CXR (10/28):  Bilateral chronic interstitial lung disease with bilateral upper lobe interstitial fibrosis. Appearance is consistent with patient's history of sarcoidosis. Superimposed interstitial edema or pneumonitis secondary to an infectious or inflammatory etiology cannot be excluded.   Leeanne Rio, MD 02/06/2014, 8:36 AM PGY-3, [facility] [facility] Intern pager: [phone], text pages welcome

## 2014-02-06 NOTE — Plan of Care (Signed)
Problem: Phase I Progression Outcomes Goal: EF % per last Echo/documented,Core Reminder form on chart Outcome: Progressing EF 55-60% on 12/21/13

## 2014-02-06 NOTE — Consult Note (Signed)
Patient Rachel [name]      DOB: 24-Jun-1960      MNO:[id number]     Consult Note from the Palliative Medicine Team at [facility] Requested by: Dr Gerarda Fraction     PCP: Venetia Maxon, Harrell Gave, MD Reason for Consultation: Dyspnea in ES Lung Disease    Phone Number:[phone]  Assessment/Recommendations: 53 yo with sarcoidosis, pulm HTN, pulm fibrosis admitted with dyspnea.  1.  Code Status: Full, did not address  2. Goals of Care: Keajah knows of her severe lung disease and has evaluation at [facility] in past for lung transplant.  She has been approached about hospice care (maybe ~1 year ago) and was frustrated that was suggested then.  One of the major issues with hospice is that she would no longer be able to receive her Pulm HTN medications, and I would not recommend hospice care based on that alone given how functional she remains. She is of course susceptible to setbacks and hospice may be of greater benefit to her down road.  Our team would be happy to follow her in future admissions to support her and help build her goals as her disease course changes.     3. Symptom Management:   1. Dyspnea- 2/2 pulmonary disease.  On current pulm regimen per Dr Malvin Johns.  I do not see any changes that would likely help her. While opioids can help with dyspnea, I would worry about giving these to her with her degree of orthostasis and dizziness. She also would not even be agreeable to trying them  2. Loss of appetite- On steroids. Doubt other meds will be more effective  4. Psychosocial/Spiritual: Lives in [place] with her daughter, 6 grandchildren and brother.  Strong faith background.    Brief HPI: 53 yo female with PMhx of sarcoidosis and resultant pulm fibrosis, pulm htn who presented from home with dyspnea and exacerbation of her lung disease.  Follows with Dr Lamonte Sakai for her lungs and Dr Marigene Ehlers for her heart.  Lung disease managed with steroids, tyvaso, revatio, diuretics.  Has some chronic  exertional dyspnea but still able to do housework. Had worsening orthopnea for several days and DOE prior to admission.  She has been evaluated by [facility] in the past for possible lung tx but her understanding is that she would not be a candidate.  This is her 4th admission since August.     PMH:  Past Medical History  Diagnosis Date  . GERD (gastroesophageal reflux disease)   . Hypertension   . 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 CHF (congestive heart failure)     Right side- Dr. Aundra Dubin ([place])  . Pulmonary HTN   . Anxiety   . Cardiomyopathy   . NSVT (nonsustained ventricular tachycardia)     with syncope: St Jude ICD was implanted. Device now nearing ERI. Given end stage lung disease and normalization of LV function, the device will not be replaced and tachy therapies were turned off  . Chronic chest pain     LHC (08/2008) with no angiographic coronary diease  . Allergic rhinitis   . H/O: iron deficiency anemia   . Secondary amenorrhea     irregular menses  . Psychosis     with high dose steroids  . SVT (supraventricular tachycardia)     possible runs  . Hypokalemia     recurrent  . Rotator cuff sprain   . Chronic back pain   . COPD (chronic obstructive  pulmonary disease)   . Shortness of breath      PSH: Past Surgical History  Procedure Laterality Date  . Cardiac catheterization  08/26/2008    5% EF with normal coronary arteries and normal wall motion  . Adenosine cardiolyte  03/31/2003    no ischemia/infarct; marked global LV hypekinesis; resting EF 22%  . Cardiac catheterization  03/31/2003    globally depressed LV fxn with EF 20%; idiopathic dilated cardiomyopathy  . Defibrillator placed  03/31/2003    St. Jude single chamber (Dr. Cristopher Peru)  . Pfts      FVC 2000 (56%) FEV1 1400 (47%), ratio 0.7; insignif response to bronchodilatior; stable from 1/05 - 9/05; PFTs: Mod restriction, FVC 1.9L, FEV1 1.6L, both 53% predicted; slt response to brochodilators 04/08/2002    I have reviewed the [place] and SH and  If appropriate update it with new information. Allergies  Allergen Reactions  . Latex Rash  . Nitroglycerin Other (See Comments)    Patient is on Revatio  . Other Other (See Comments)    High Dose Steroids cause chemical imbalance and altered mental status  . Meperidine Hcl Other (See Comments)    REACTION: Makes her feel funny  . Tramadol Hcl Other (See Comments)    REACTION: nausea, lightheadedness, sleepiness, and dizziness  . Ace Inhibitors Other (See Comments)    unknown  . Morphine Other (See Comments)    Loopy, light headed feeling    . Propoxyphene N-Acetaminophen Rash   Scheduled Meds: . albuterol  2.5 mg Nebulization QID  . ARIPiprazole  2 mg Oral QODAY  . aspirin EC  81 mg Oral Daily  . budesonide-formoterol  1 puff Inhalation BID  . doxycycline  100 mg Oral Q12H  . heparin  5,000 Units Subcutaneous 3 times per day  . pantoprazole  80 mg Oral Q1200  . PARoxetine  20 mg Oral Daily  . polyethylene glycol  17 g Oral BID  . predniSONE  10 mg Oral Q breakfast  . senna  1 tablet Oral BID  . sildenafil  80 mg Oral TID  . sodium chloride  3 mL Intravenous Q12H  . sodium chloride  3 mL Intravenous Q12H  . Treprostinil  18 mcg Inhalation QID   Continuous Infusions:  PRN Meds:.sodium chloride, acetaminophen, albuterol, alum & mag hydroxide-simeth, ondansetron **OR** ondansetron (ZOFRAN) IV, sodium chloride    BP 100/50 mmHg  Pulse 82  Temp(Src) 98.6 F (37 C) (Oral)  Resp 20  Ht _0  (1.6 m)  Wt 73.573 kg (162 lb 3.2 oz)  BMI 28.74 kg/m2  SpO2 95%   PPS: 70   Intake/Output Summary (Last 24 hours) at 02/06/14 1737 Last data filed at 02/06/14 1649  Gross per 24 hour  Intake    960 ml  Output   2350 ml  Net  -1390 ml   Physical Exam:  General: Alert, NAD HEENT:  [place], sclera anicteric Neck: supple Chest:  Fine crackles CVS: RRR   Labs: CBC    Component Value Date/Time   WBC 8.4 02/06/2014 0000   RBC 3.64*  02/06/2014 0000   HGB 10.0* 02/06/2014 0000   HCT 33.2* 02/06/2014 0000   PLT 300 02/06/2014 0000   MCV 91.2 02/06/2014 0000   MCH 27.5 02/06/2014 0000   MCHC 30.1 02/06/2014 0000   RDW 15.2 02/06/2014 0000   LYMPHSABS 2.2 02/02/2014 1541   MONOABS 0.5 02/02/2014 1541   EOSABS 0.7 02/02/2014 1541   BASOSABS 0.1 02/02/2014 1541  BMET    Component Value Date/Time   NA 136* 02/06/2014 0000   K 3.5* 02/06/2014 0000   CL 98 02/06/2014 0000   CO2 27 02/06/2014 0000   GLUCOSE 129* 02/06/2014 0000   BUN 19 02/06/2014 0000   CREATININE 0.87 02/06/2014 0000   CREATININE 0.67 08/12/2013 1129   CALCIUM 8.9 02/06/2014 0000   GFRNONAA 75* 02/06/2014 0000   GFRAA 87* 02/06/2014 0000    CMP     Component Value Date/Time   NA 136* 02/06/2014 0000   K 3.5* 02/06/2014 0000   CL 98 02/06/2014 0000   CO2 27 02/06/2014 0000   GLUCOSE 129* 02/06/2014 0000   BUN 19 02/06/2014 0000   CREATININE 0.87 02/06/2014 0000   CREATININE 0.67 08/12/2013 1129   CALCIUM 8.9 02/06/2014 0000   PROT 7.6 02/03/2014 0620   ALBUMIN 3.2* 02/03/2014 0620   AST 11 02/03/2014 0620   ALT 11 02/03/2014 0620   ALKPHOS 59 02/03/2014 0620   BILITOT 0.4 02/03/2014 0620   GFRNONAA 75* 02/06/2014 0000   GFRAA 87* 02/06/2014 0000   10/29 CXR IMPRESSION: Stable chronic interstitial prominence and extensive fibrotic changes bilaterally. No definite superimposed infiltrate or pulmonary edema.    Total Time: 50 minutes  Greater than 50%  of this time was spent counseling and coordinating care related to the above assessment and plan.    Doran Clay D.O. Palliative Medicine Team at [facility]  Pager: [phone] Team Phone: [phone]

## 2014-02-06 NOTE — Progress Notes (Signed)
Patient Name: Rachel [name] Date of Encounter: 02/06/2014    SUBJECTIVE: Weak and dizzy most of the day yesterday. Feels stronger today.  TELEMETRY: Filed Vitals:   02/05/14 1416 02/05/14 1627 02/05/14 1958 02/06/14 0435  BP: 91/53 100/50 113/71 96/47  Pulse: 71 62 68 62  Temp: 97.9 F (36.6 C)  98.3 F (36.8 C) 97.6 F (36.4 C)  TempSrc: Oral  Oral Oral  Resp: _0 Height:      Weight:    162 lb 3.2 oz (73.573 kg)  SpO2: 97%  97% 100%    Intake/Output Summary (Last 24 hours) at 02/06/14 0815 Last data filed at 02/06/14 0150  Gross per 24 hour  Intake    960 ml  Output   1350 ml  Net   -390 ml  Total I/O sent admission: -2800 cc LABS: Basic Metabolic Panel:  Recent Labs  02/05/14 0440 02/06/14  NA 143 136*  K 4.1 3.5*  CL 102 98  CO2 26 27  GLUCOSE 80 129*  BUN 21 19  CREATININE 0.97 0.87  CALCIUM 9.0 8.9   CBC:  Recent Labs  02/05/14 0440 02/06/14  WBC 8.3 8.4  HGB 9.7* 10.0*  HCT 32.0* 33.2*  MCV 90.7 91.2  PLT 315 300   Cardiac Enzymes:  Recent Labs  02/03/14 1119 02/03/14 1519 02/03/14 2138  TROPONINI <0.30 <0.30 <0.30     Radiology/Studies:  No new data  Physical Exam: Blood pressure 96/47, pulse 62, temperature 97.6 F (36.4 C), temperature source Oral, resp. rate 18, height _1  (1.6 m), weight 162 lb 3.2 oz (73.573 kg), SpO2 100 %. Weight change: -1 lb 8 oz (-0.68 kg)  Wt Readings from Last 3 Encounters:  02/06/14 162 lb 3.2 oz (73.573 kg)  01/17/14 163 lb (73.936 kg)  01/07/14 166 lb 9.6 oz (75.569 kg)   No gallop or murmur Clear lung fields anteriorly Neck veins  ASSESSMENT:  1. Respiratory failure secondary to sarcoidosis and pulmonary fibrosis 2. Diastolic heart failure, compensated  Plan:  Please call if we can be of further assistance Would maintain typical outpatient diuretic regimen  Signed, Sinclair Grooms 02/06/2014, 8:15 AM

## 2014-02-07 LAB — BASIC METABOLIC PANEL
Anion gap: 8 (ref 5–15)
BUN: 18 mg/dL (ref 6–23)
CALCIUM: 8.9 mg/dL (ref 8.4–10.5)
CO2: 32 mEq/L (ref 19–32)
Chloride: 103 mEq/L (ref 96–112)
Creatinine, Ser: 0.74 mg/dL (ref 0.50–1.10)
GFR calc Af Amer: >90 mL/min (ref >90)
Glucose, Bld: 83 mg/dL (ref 70–99)
Potassium: 3.9 mEq/L (ref 3.7–5.3)
SODIUM: 143 meq/L (ref 137–147)

## 2014-02-07 LAB — CBC
HCT: 30.9 % — ABNORMAL LOW (ref 36.0–46.0)
Hemoglobin: 9.4 g/dL — ABNORMAL LOW (ref 12.0–15.0)
MCH: 27.2 pg (ref 26.0–34.0)
MCHC: 30.4 g/dL (ref 30.0–36.0)
MCV: 89.3 fL (ref 78.0–100.0)
PLATELETS: 295 10*3/uL (ref 150–400)
RBC: 3.46 MIL/uL — ABNORMAL LOW (ref 3.87–5.11)
RDW: 15.1 % (ref 11.5–15.5)
WBC: 7.7 10*3/uL (ref 4.0–10.5)

## 2014-02-07 MED ORDER — PREDNISONE 5 MG PO TABS
5.0000 mg | ORAL_TABLET | Freq: Every day | ORAL | Status: DC
  Administered 2014-02-08: 5 mg via ORAL
  Filled 2014-02-07 (×2): qty 1

## 2014-02-07 MED ORDER — SODIUM CHLORIDE 0.9 % IV SOLN
INTRAVENOUS | Status: AC
  Administered 2014-02-07: 12:00:00 via INTRAVENOUS
  Filled 2014-02-07: qty 1000

## 2014-02-07 NOTE — Evaluation (Signed)
Physical Therapy Evaluation Patient Details Name: Rachel [name] MRN: [id number] DOB: [birth year] Today's Date: 02/07/2014   History of Present Illness  53 yo with sarcoidosis, pulm HTN, pulm fibrosis admitted with dyspnea  Clinical Impression  Patient demonstrates deficits in functional mobility as indicated below. Will need continued skilled PT to address deficits and maximize function will see as indicate and progress as tolerated. Recommend HHPT upon discharge.  Ambulated on 6 liters with multiple rest breaks, SpO2 dropped to 89% during ambulation but rebounded to 93% with rest and breathing techniques.    Follow Up Recommendations Home health PT;Supervision/Assistance - 24 hour    Equipment Recommendations  None recommended by PT    Recommendations for Other Services       Precautions / Restrictions Precautions Precautions: Fall Restrictions Weight Bearing Restrictions: No      Mobility  Bed Mobility Overal bed mobility: Modified Independent             General bed mobility comments: increased time to perform  Transfers Overall transfer level: Needs assistance Equipment used: Rolling walker (2 wheeled) Transfers: Sit to/from Stand Sit to Stand: Supervision            Ambulation/Gait Ambulation/Gait assistance: Min guard;Supervision Ambulation Distance (Feet): 120 Feet Assistive device: Rolling walker (2 wheeled) Gait Pattern/deviations: Step-through pattern;Decreased stride length;Narrow base of support Gait velocity: decreased Gait velocity interpretation: Below normal speed for age/gender General Gait Details: multiple rest breaks required, LEs weakness during ambulation, heavy reliance on RW.  Stairs            Wheelchair Mobility    Modified Rankin (Stroke Patients Only)       Balance                                             Pertinent Vitals/Pain      Home Living Family/patient expects to be discharged to::  Private residence Living Arrangements: Children Available Help at Discharge: Family;Available PRN/intermittently Type of Home: Apartment Home Access: Level entry     Home Layout: One level Home Equipment: Cane - single point;Walker - 4 wheels (pulse ox) Additional Comments: Pt takes tub baths    Prior Function Level of Independence: Independent with assistive device(s)         Comments: cane for shorter distances, RW for longer. Sits to wash hair, stands in tub to shower off     Hand Dominance   Dominant Hand: Right    Extremity/Trunk Assessment   Upper Extremity Assessment: Generalized weakness           Lower Extremity Assessment: Generalized weakness         Communication   Communication: No difficulties  Cognition Arousal/Alertness: Awake/alert Behavior During Therapy: WFL for tasks assessed/performed Overall Cognitive Status: Within Functional Limits for tasks assessed                      General Comments General comments (skin integrity, edema, etc.): educated on pursed lip breathing and energy conservation techniques including use of chairs and seat on rollator for rest breaks, cues for use of rollator vs cane for energy conservation, and HEP LE exercises for home to be performed in am.    Exercises General Exercises - Lower Extremity Ankle Circles/Pumps: AROM (for HEP) Long Arc Quad: AROM (for HEP) Mini-Sqauts: AROM (for HEP)  Assessment/Plan    PT Assessment Patient needs continued PT services  PT Diagnosis Difficulty walking;Generalized weakness   PT Problem List Decreased strength;Decreased activity tolerance;Decreased balance;Decreased mobility;Cardiopulmonary status limiting activity  PT Treatment Interventions DME instruction;Gait training;Functional mobility training;Therapeutic activities;Therapeutic exercise;Balance training;Patient/family education   PT Goals (Current goals can be found in the Care Plan section) Acute Rehab  PT Goals Patient Stated Goal: to go home with her family PT Goal Formulation: With patient Time For Goal Achievement: 02/21/14 Potential to Achieve Goals: Good    Frequency Min 3X/week   Barriers to discharge        Co-evaluation               End of Session Equipment Utilized During Treatment: Gait belt;Oxygen Activity Tolerance: Patient tolerated treatment well;Patient limited by fatigue Patient left: in chair;with call bell/phone within reach;with family/visitor present Nurse Communication: Mobility status         Time: 3085-6943 PT Time Calculation (min): 26 min   Charges:   PT Evaluation $Initial PT Evaluation Tier I: 1 Procedure PT Treatments $Gait Training: 8-22 mins $Self Care/Home Management: 8-22   PT G CodesDuncan Dull 02/07/2014, 9:44 AM Alben Deeds, PT DPT  [phone]

## 2014-02-07 NOTE — Progress Notes (Signed)
Family Medicine Teaching Service Daily Progress Note Intern Pager: [phone]  Patient name: Rachel [name] Medical record number: [id number] Date of birth: 1960/08/08 Age: 53 y.o. Gender: female  Primary Care Provider: Christa See, MD Consultants: none Code Status: full  Pt Overview and Major Events to Date:  10/28 - admit to [facility] for dyspnea 10/29-10/30 - improved breathing with diuresis but relative hypotension (~51'W-258'N systolic), symptomatic 27/78 - similar relative hypotension, but overall feeling some better; slight drop in Hb  Assessment and Plan:  Rachel [name] is a 53 y.o. female presenting with increased dyspnea since Sunday (3 days ago). Patient's past medical history is significant for recent admission for HCAP, COPD, pulmonary sarcoidosis, chronic prednisone use, HTN, Pulmonary hypertension, cardiomyopathy, CHF, SVT, MDD with psychotic features hypoxia and home oxygen use.   Relative hypotension / headache - concern for mild orthostatis s/p diuresis, with headache and "seeing stars" after sitting up, that resolves after a little time. Continues to have some dizziness but feels it has improved some.  - holding metoprolol since 10/31 (last dose on 10/30), discussed with cardiology yesterday and Lasix was held. Recommended against midodrine. - HF to see patient today and advise on discharge medications (metorprolol and Lasix) - recommended OOB / up as tolerated, PT/OT to see patient today - given concerns for dehydration, will start 70cc/hr NS x 24hrs  Dyspnea, CP: patient still requiring 5-6L of oxygen at baseline but overall breathing improved. Most likely related to pulmonary edema given intermittent desats with rapid improvement after diuresis.  BNP 1500 on admission (baseline ~500) with some improvement to 1294, +orthopnea, h/o missing doses of Lasix at home. Recent Echo (9/15) with EF 55-60%.  Patient also has recent increased sputum production, so being treated for COPD  exacerbation as well.  EKG unchanged and troponins negative. Repeat CXR with no definite superimposed infiltrate or pulmonary edema - Continue Prednisone 66m daily (day 6/6)- will discharge home with 548mdaily with close pulmonology f/u - Continue Doxycycline 100 mg BID for total 7 days (11/2 is day 5) - Continued home Albuterol 2 puffs q4h and Symbicort 1 puff BID, Albuterol q2h prn  - PO Lasix 40 mg q AM and 20 mg qPM (home dose)- currently holding until recommendations from heart failure   HTN/pulmonary HTN: Patient compliant with revatio, Tyvaso, metoprolol and Lasix. She states she usually takes her Lasix, just forgot for 1 day. In September 2015, pulmonary peak pressure 54 mg Hg. Patient been having low blood pressures. This is new and acute in onset. Pressures have been consistently low here and in previous notes in our clinic, with orthostatic-type symptoms - cards consulted, appreciate assistance; recommending home Lasix dose 40 mg / 20 mg (holding today, see above)  - Continue home Revatio, Tyvaso; holding Metoprolol.  - will need outpt pulm follow-up  Chronic anemia: likely related to chronic disease and heart failure, with slight drop from baseline and "minor" hemoptysis per pt, but this may be due to airway irritation from recent coughing as symptoms / appearance doesn't suggest other serious etiology. Hemoglobin stable from 10 to 9.4 today. - monitor CBC, and consider further work-up if symptoms worsen or if Hb precipitously drops  GERD: Stable, no complaints.  -Continue home Nexium   MDD w/ psychosis: Mood stable. - Continue Abilify and Paxil, caution with high-dose steroids.   FEN/GI: Regular diet; NS @ 70cc/hr x 24hrs given hypotension  Prophylaxis: Hep SQ  Disposition: management as above, PT/OT to see pt today, most likely d/c  tomorrow pending orthostasis  Subjective: BP improved at 115/64.  Having some dizziness with standing, however she feels it is starting to improve.  Also notes some nausea with breakfast, however no emesis and continues to tolerate food.  Objective: Temp:  [97.6 F (36.4 C)-98.6 F (37 C)] 97.9 F (36.6 C) (11/02 0507) Pulse Rate:  [65-82] 65 (11/02 0507) Resp:  [19-21] 21 (11/02 0507) BP: (98-115)/(50-64) 115/64 mmHg (11/02 0507) SpO2:  [93 %-98 %] 96 % (11/02 0859) Weight:  [164 lb 7.4 oz (74.6 kg)] 164 lb 7.4 oz (74.6 kg) (11/02 0507) Physical Exam: General: Sitting up in bed eating breakfast in NAD. Patient on 5-6 L nasal cannula.  Cardiovascular: RRR, no m/r/g. Respiratory: CTAB. No wheeze/crackles/rhonchi  Normal WOB Skin: Warm and dry, no rashes noted.  Neuro: grossly nonfocal, speech normal   Intake/Output Summary (Last 24 hours) at 02/07/14 0948 Last data filed at 02/07/14 0923  Gross per 24 hour  Intake    960 ml  Output   1502 ml  Net   -542 ml   Laboratory:  Recent Labs Lab 02/05/14 0440 02/06/14 02/07/14 0428  WBC 8.3 8.4 7.7  HGB 9.7* 10.0* 9.4*  HCT 32.0* 33.2* 30.9*  PLT 315 300 295    Recent Labs Lab 02/02/14 1541 02/03/14 0620  02/05/14 0440 02/06/14 02/07/14 0428  NA 142 144  < > 143 136* 143  K 4.0 4.3  < > 4.1 3.5* 3.9  CL 102 102  < > 102 98 103  CO2 27 30  < > 26 27 32  BUN 18 17  < > _0 CREATININE 0.82 0.74  < > 0.97 0.87 0.74  CALCIUM 9.1 9.4  < > 9.0 8.9 8.9  PROT 8.0 7.6  --   --   --   --   BILITOT 0.3 0.4  --   --   --   --   ALKPHOS 62 59  --   --   --   --   ALT 13 11  --   --   --   --   AST 14 11  --   --   --   --   GLUCOSE 79 103*  < > 80 129* 83  < > = values in this interval not displayed.  BNP (last 3 results)  Recent Labs  12/20/13 1143 01/05/14 1417 02/02/14 1553  PROBNP 1779.0* 549.1* 1294.0*    Recent Labs Lab 02/03/14 1119 02/03/14 1519 02/03/14 2138  TROPONINI <0.30 <0.30 <0.30   Imaging/Diagnostic Tests: CXR (10/28):  Bilateral chronic interstitial lung disease with bilateral upper lobe interstitial fibrosis. Appearance is consistent  with patient's history of sarcoidosis. Superimposed interstitial edema or pneumonitis secondary to an infectious or inflammatory etiology cannot be excluded.   Archie Patten, MD 02/07/2014, 9:48 AM PGY-1, [place] Intern pager: [phone], text pages welcome

## 2014-02-07 NOTE — Progress Notes (Signed)
PT Cancellation Note  Patient Details Name: KIERSTIN JANUARY MRN: [id number] DOB: 07-07-1960   Cancelled Treatment:    Reason Eval/Treat Not Completed: Patient not medically ready (strict bedrest orders active, need updated activity orders)   Duncan Dull 02/07/2014, 8:50 AM Alben Deeds, PT DPT  [phone]

## 2014-02-07 NOTE — Evaluation (Signed)
Occupational Therapy Evaluation and Discharge Patient Details Name: Rachel [name] MRN: [id number] DOB: 11-22-1960 Today's Date: 02/07/2014    History of Present Illness 53 yo with sarcoidosis, pulm HTN, pulm fibrosis admitted with dyspnea   Clinical Impression   This 53 yo female admitted with above presents to acute OT with DOE (which pt states is better than it was), pt is aware she does not need to do too much at a time, that she should not take hot baths/showers, and she is aware of purse lipped breathing no further OT needs, we will sign off.    Follow Up Recommendations  Home health OT    Equipment Recommendations  None recommended by OT       Precautions / Restrictions Precautions Precautions: Fall Precaution Comments: Pt stated her legs were tired and shaky--when her O2 sats were checked on 6 liters of O2 while ambulating they were 75% which could be why her legs were feeling this way. Pt had to stop 3 times on the way back to her room from nurses station to catch her breath by doing purse lipped breathing (I asked her each time if she need to sit down and she said no, I need to work through this) Restrictions Weight Bearing Restrictions: No      Mobility Bed Mobility Overal bed mobility: Modified Independent                Transfers Overall transfer level: Needs assistance Equipment used: Rolling walker (2 wheeled) Transfers: Sit to/from Stand Sit to Stand: Supervision                   ADL Overall ADL's : Needs assistance/impaired Eating/Feeding: Independent;Sitting   Grooming: Supervision/safety;Set up;Sitting   Upper Body Bathing: Supervision/ safety;Set up;Sitting   Lower Body Bathing: Supervison/ safety;Set up;Sit to/from stand   Upper Body Dressing : Supervision/safety;Set up;Sitting   Lower Body Dressing: Supervision/safety;Set up;Sit to/from stand   Toilet Transfer: Supervision/safety;Ambulation;RW (bed>out door and down hall to nurses  station and back to bed)   Toileting- Water quality scientist and Hygiene: Supervision/safety;Sit to/from stand         General ADL Comments: Pt using purse lipped breathing throughout session without need for cues. Pt aware she needs to take her time in whatever she is trying to do               Pertinent Vitals/Pain Pain Assessment: No/denies pain     Hand Dominance Right   Extremity/Trunk Assessment Upper Extremity Assessment Upper Extremity Assessment: Overall WFL for tasks assessed           Communication Communication Communication: No difficulties   Cognition Arousal/Alertness: Awake/alert Behavior During Therapy: WFL for tasks assessed/performed Overall Cognitive Status: Within Functional Limits for tasks assessed                                Home Living Family/patient expects to be discharged to:: Private residence Living Arrangements: Children Available Help at Discharge: Family;Available PRN/intermittently Type of Home: Apartment Home Access: Level entry     Home Layout: One level     Bathroom Shower/Tub: Tub/shower unit;Curtain Shower/tub characteristics: Architectural technologist: Standard     Home Equipment: Cane - single point;Walker - 4 wheels (pulse ox)   Additional Comments: Pt takes tub baths and washes hair while seated in tub then stands up to shower off.      Prior Functioning/Environment Level of  Independence: Independent with assistive device(s)        Comments: cane for shorter distances, RW for longer.     OT Diagnosis: Generalized weakness   OT Problem List: Decreased strength;Decreased activity tolerance;Impaired balance (sitting and/or standing);Obesity      OT Goals(Current goals can be found in the care plan section) Acute Rehab OT Goals Patient Stated Goal: to go home with her family  OT Frequency:                End of Session Equipment Utilized During Treatment: Rolling walker;Oxygen  Activity  Tolerance: Patient limited by fatigue Patient left: in bed;with call bell/phone within reach   Time: 1456-1520 OT Time Calculation (min): 24 min Charges:  OT General Charges $OT Visit: 1 Procedure OT Evaluation $Initial OT Evaluation Tier I: 1 Procedure OT Treatments $Self Care/Home Management : 8-22 mins  Almon Register [phone] 02/07/2014, 5:15 PM

## 2014-02-08 DIAGNOSIS — I509 Heart failure, unspecified: Secondary | ICD-10-CM | POA: Insufficient documentation

## 2014-02-08 DIAGNOSIS — R0602 Shortness of breath: Secondary | ICD-10-CM | POA: Insufficient documentation

## 2014-02-08 MED ORDER — FUROSEMIDE 20 MG PO TABS: ORAL_TABLET | ORAL | Status: DC

## 2014-02-08 MED ORDER — PREDNISONE 5 MG PO TABS: 5.0000 mg | ORAL_TABLET | Freq: Every day | ORAL | Status: DC

## 2014-02-08 MED ORDER — ARIPIPRAZOLE 2 MG PO TABS
2.0000 mg | ORAL_TABLET | ORAL | Status: DC
  Filled 2014-02-08: qty 1

## 2014-02-08 MED ORDER — DOXYCYCLINE HYCLATE 100 MG PO TABS: 100.0000 mg | ORAL_TABLET | Freq: Two times a day (BID) | ORAL | Status: DC

## 2014-02-08 NOTE — Progress Notes (Signed)
Family Medicine Teaching Service Daily Progress Note Intern Pager: [phone]  Patient name: Rachel [name] Medical record number: [id number] Date of birth: Jul 01, 1960 Age: 53 y.o. Gender: female  Primary Care Provider: Christa See, MD Consultants: None Code Status: Full  Pt Overview and Major Events to Date:  10/28 - admit to [facility] for dyspnea 10/29-10/30 - improved breathing with diuresis but relative hypotension (~03'U-882'C systolic), symptomatic 00/34 - similar relative hypotension, but overall feeling some better; slight drop in Hb  Assessment and Plan:  Rachel [name] is a 53 y.o. female presenting with increased dyspnea since 01/30/14. PMH significant for recent admission for HCAP, COPD, pulmonary sarcoidosis, chronic prednisone use, HTN, Pulmonary hypertension, cardiomyopathy, CHF, SVT, MDD with psychotic features hypoxia and home oxygen use.   Relative hypotension / headache - concern for mild orthostatis s/p diuresis, with headache and "seeing stars" after sitting up, that resolves after a little time. Continues to have some dizziness but feels it has improved some. - 115/56 today  - Holding metoprolol since 10/31 (last dose on 10/30), discussed with cardiology and Lasix was held. Recommended against midodrine. - HF to see patient and advise on discharge medications (metoprolol and Lasix) - Recommended OOB / up as tolerated, PT/OT to see patient today - Given concerns for dehydration, will start 70cc/hr NS x 24hrs  Dyspnea, CP: patient still requiring 5-6L of oxygen at baseline but overall breathing improved. Most likely related to pulmonary edema given intermittent desats with rapid improvement after diuresis.  BNP 1500 on admission (baseline ~500) with some improvement to 1294, +orthopnea, h/o missing doses of Lasix at home. Recent Echo (9/15) with EF 55-60%.  Patient also has recent increased sputum production, so being treated for COPD exacerbation as well.  EKG unchanged and  troponins negative. Repeat CXR with no definite superimposed infiltrate or pulmonary edema - Weight 167 pounds today up from 164 yesterday - Obtained last dose of Prednisone 53m on 11/2- will discharge home with 570mdaily with close pulmonology f/u - Continue Doxycycline 100 mg BID for total 7 days (day# 6/7) - Continued home Albuterol 2 puffs q4h and Symbicort 1 puff BID, Albuterol q2h prn  - PO Lasix 40 mg q AM and 20 mg qPM (home dose)- currently holding until recommendations from heart failure   HTN/pulmonary HTN: Patient compliant with Revatio, Tyvaso, Metoprolol and Lasix. She states she usually takes her Lasix, just forgot for 1 day. In September 2015, pulmonary peak pressure 54 mg Hg. Patient been having low blood pressures. This is new and acute in onset. Pressures have been consistently low here and in previous notes in our clinic, with orthostatic-type symptoms - Cards consulted, appreciate assistance; recommending home Lasix dose 40 mg / 20 mg (holding today, see above)  - Continue home Revatio, Tyvaso; holding Metoprolol.  - Will need outpt pulm follow-up  FEN/GI: Regular diet; NS @ 70cc/hr x 24hrs given hypotension  Prophylaxis: Hep SQ  Disposition: management as above, PT/OT to see pt today, most likely d/c tomorrow pending orthostasis  Subjective: States she has felt slightly fluid overloaded since last night.  States she has had shortness of breath and gasping while eating this morning and when ambulating to restroom.  Chest feels tight.  Otherwise feels ready to go home.  Objective: Temp:  [98.4 F (36.9 C)-98.8 F (37.1 C)] 98.8 F (37.1 C) (11/03 0639) Pulse Rate:  [72-90] 72 (11/03 0639) Resp:  [20-22] 20 (11/03 0639) BP: (96-115)/(51-63) 115/56 mmHg (11/03 0639) SpO2:  [74 %-96 %]  96 % (11/03 0639) Weight:  [167 lb 3.2 oz (75.841 kg)] 167 lb 3.2 oz (75.841 kg) (11/03 5374) Physical Exam: General: Sitting up in bed and resting comfortably. Patient on 6 L nasal  cannula.  Cardiovascular: RRR, no m/r/g. Respiratory: CTAB. No wheeze/crackles/rhonchi  Normal WOB Skin: Warm and dry, no rashes noted.  Neuro: grossly nonfocal, speech normal   Intake/Output Summary (Last 24 hours) at 02/08/14 0807 Last data filed at 02/08/14 0600  Gross per 24 hour  Intake 1313.5 ml  Output    402 ml  Net  911.5 ml   Laboratory:  Recent Labs Lab 02/05/14 0440 02/06/14 02/07/14 0428  WBC 8.3 8.4 7.7  HGB 9.7* 10.0* 9.4*  HCT 32.0* 33.2* 30.9*  PLT 315 300 295    Recent Labs Lab 02/02/14 1541 02/03/14 0620  02/05/14 0440 02/06/14 02/07/14 0428  NA 142 144  < > 143 136* 143  K 4.0 4.3  < > 4.1 3.5* 3.9  CL 102 102  < > 102 98 103  CO2 27 30  < > 26 27 32  BUN 18 17  < > _0 CREATININE 0.82 0.74  < > 0.97 0.87 0.74  CALCIUM 9.1 9.4  < > 9.0 8.9 8.9  PROT 8.0 7.6  --   --   --   --   BILITOT 0.3 0.4  --   --   --   --   ALKPHOS 62 59  --   --   --   --   ALT 13 11  --   --   --   --   AST 14 11  --   --   --   --   GLUCOSE 79 103*  < > 80 129* 83  < > = values in this interval not displayed.  BNP (last 3 results)  Recent Labs  12/20/13 1143 01/05/14 1417 02/02/14 1553  PROBNP 1779.0* 549.1* 1294.0*    Recent Labs Lab 02/03/14 1119 02/03/14 1519 02/03/14 2138  TROPONINI <0.30 <0.30 <0.30   Imaging/Diagnostic Tests: CXR (10/28):  Bilateral chronic interstitial lung disease with bilateral upper lobe interstitial fibrosis. Appearance is consistent with patient's history of sarcoidosis. Superimposed interstitial edema or pneumonitis secondary to an infectious or inflammatory etiology cannot be excluded.   [place], [place] 02/08/2014, 8:07 AM PGY-1, [place] Intern pager: [phone], text pages welcome

## 2014-02-08 NOTE — Progress Notes (Signed)
Pt called c/o chest tightness and difficulty breathing.Sats 88-89 on 6l. MD made aware.

## 2014-02-08 NOTE — Progress Notes (Cosign Needed)
Per MD patient is ready for discharge. Patient and nurse notified for transportation services. CSW-Intern signing off . Gwenevere Abbot

## 2014-02-08 NOTE — Progress Notes (Signed)
Physical Therapy Treatment Patient Details Name: Rachel [name] MRN: [id number] DOB: November 11, 1960 Today's Date: 02/08/2014    History of Present Illness 53 yo with sarcoidosis, pulm HTN, pulm fibrosis admitted with dyspnea    PT Comments    Patient agreeable to ambulation despite intermittent tightness and SOB. Ambulated in hall, spo2 monitored with increased rest breaks today secondary to decreased activity tolerance. SpO2 desaturated to 83% on 6 liters at lowest, patient demonstrates good awareness of breathing and limitations. Increased distance performed with no physical assist, but did require significantly more time to perform due to rest breaks. Will continue to see as indicated and progress as tolerated. Patient very appreciative.   Follow Up Recommendations  Home health PT;Supervision/Assistance - 24 hour     Equipment Recommendations  None recommended by PT    Recommendations for Other Services       Precautions / Restrictions Precautions Precautions: Fall Restrictions Weight Bearing Restrictions: No    Mobility  Bed Mobility Overal bed mobility: Modified Independent             General bed mobility comments: increased time to perform  Transfers Overall transfer level: Needs assistance Equipment used: Rolling walker (2 wheeled) Transfers: Sit to/from Stand Sit to Stand: Supervision         General transfer comment: performed to [facility] and from bed  Ambulation/Gait Ambulation/Gait assistance: Supervision Ambulation Distance (Feet): 160 Feet Assistive device: Rolling walker (2 wheeled)   Gait velocity: decreased Gait velocity interpretation: Below normal speed for age/gender General Gait Details: More rest breaks today secondary to poor o2 saturations and SOB with tightness, MD aware. Patient demonstrates good awareness during functional mobility   Stairs            Wheelchair Mobility    Modified Rankin (Stroke Patients Only)       Balance                                     Cognition Arousal/Alertness: Awake/alert Behavior During Therapy: WFL for tasks assessed/performed Overall Cognitive Status: Within Functional Limits for tasks assessed                      Exercises      General Comments        Pertinent Vitals/Pain      Home Living Family/patient expects to be discharged to:: Private residence Living Arrangements: Children Available Help at Discharge: Family;Available PRN/intermittently Type of Home: Apartment Home Access: Level entry   Home Layout: One level Home Equipment: Cane - single point;Walker - 4 wheels (pulse ox) Additional Comments: Pt takes tub baths    Prior Function Level of Independence: Independent with assistive device(s)      Comments: cane for shorter distances, RW for longer. Sits to wash hair, stands in tub to shower off   PT Goals (current goals can now be found in the care plan section) Acute Rehab PT Goals Patient Stated Goal: to go home with her family PT Goal Formulation: With patient Time For Goal Achievement: 02/21/14 Potential to Achieve Goals: Good    Frequency  Min 3X/week    PT Plan      Co-evaluation             End of Session Equipment Utilized During Treatment: Gait belt;Oxygen Activity Tolerance: Patient tolerated treatment well;Patient limited by fatigue Patient left: in chair;with call bell/phone within reach;with family/visitor  present (on [facility] with call bell within reach)     Time: 0912-0935 PT Time Calculation (min): 23 min  Charges:  $Gait Training: 8-22 mins $Therapeutic Activity: 8-22 mins                    G CodesDuncan Dull 02/27/2014, 1:03 PM Alben Deeds, [place] DPT  [phone]

## 2014-02-08 NOTE — Discharge Instructions (Signed)
-  Please take 2 tablets of your Lasix (40m) both in the morning and at night. - Please take 538mof Prednisone daily until you follow up at the heart failure clinic. - [facility] - Please call the Heart Failure Clinic if you feel that you are getting fluid overloaded or dry.  Do the following things EVERYDAY: 1) Weigh yourself in the morning before breakfast. Write it down and keep it in a log. 2) Take your medicines as prescribed 3) Eat low salt foods--Limit salt (sodium) to 2000 mg per day.  4) Stay as active as you can everyday 5) Limit all fluids for the day to less than 2 liters

## 2014-02-08 NOTE — Progress Notes (Signed)
UR completed Lamona Eimer K. Katai Marsico, RN, BSN, [place], CCM  02/08/2014 3:01 PM

## 2014-02-08 NOTE — Plan of Care (Signed)
Problem: Phase I Progression Outcomes Goal: Dyspnea controlled at rest (HF) Outcome: Completed/Met Date Met:  02/08/14

## 2014-02-09 ENCOUNTER — Telehealth: Payer: Self-pay | Admitting: Family Medicine

## 2014-02-09 NOTE — Telephone Encounter (Signed)
Called number and left voice mail providing verbal orders to resume RN home care and for home CSW visit. Instructed [facility] to fax any forms for signed orders as needed to [place] at [phone]. Thanks. --CMS

## 2014-02-09 NOTE — Telephone Encounter (Signed)
Patti from [facility] calls, needs verbal orders to resume skilled nursing care and would also like to have orders for a CSW to visit patient in home. Please call Patti at [phone]

## 2014-02-10 ENCOUNTER — Telehealth: Payer: Self-pay | Admitting: Family Medicine

## 2014-02-10 ENCOUNTER — Inpatient Hospital Stay: Payer: Self-pay | Admitting: Family Medicine

## 2014-02-10 NOTE — Telephone Encounter (Signed)
Patti the home health nurse called and would like some clarification on patients lasix. The bottle states 40 mg in the morning and 40 mg in the evening. The issue is that patients discharge papers say 40 mg morning and 20 mg in the evening. Please call [phone] to let Precious Bard know. jw

## 2014-02-10 NOTE — Telephone Encounter (Signed)
Called [facility] RN to clarify. Based on DC summary, pt was discharged on Lasix 40 mg BID. Instructed RN pt should continue this. Pt has f/u with both cards and pulm in the next several days, regardless. RN voiced understanding and will communicate to patient. --CMS

## 2014-02-14 ENCOUNTER — Ambulatory Visit: Payer: Self-pay | Admitting: Emergency Medicine

## 2014-02-14 ENCOUNTER — Encounter (HOSPITAL_COMMUNITY): Payer: Self-pay

## 2014-02-15 ENCOUNTER — Ambulatory Visit: Payer: Medicare Other | Admitting: Emergency Medicine

## 2014-02-15 ENCOUNTER — Encounter: Payer: Self-pay | Admitting: Emergency Medicine

## 2014-02-15 VITALS — BP 98/56 | HR 101 | Ht 63.0 in | Wt 167.8 lb

## 2014-02-15 DIAGNOSIS — I2721 Secondary pulmonary arterial hypertension: Secondary | ICD-10-CM

## 2014-02-15 NOTE — Progress Notes (Signed)
Subjective:    Patient ID: Rachel [name], female    DOB: January 30, 1961, 53 y.o.   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 She is now on Revatio 80 mg tid and and inhaled Iloprost (added in 2011), managed by Dr Aundra Dubin at [place]. 6 min walk (7/12) 248 m. RHC (8/12): mean RA 2, PA 51/24, mean PA 35, mean PCWP 6, CI 3, PVR 5.1 WU. She believes that she is doing very well. She wears O2 at 6L/min at all times. She believes she knows when her sarcoidosis is active, remains on flovent.   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 Followed by Dr Aundra Dubin > current PAH meds are Revatio 80 tid + Ventavis. Most recent echocardiogram in 01/07/13 showed estimated PASP 44 mmHg (stable), decreased EF.  Last time we did a trial of Symbicort > she feels that it has helped some, she is less SOB w exertion, able to do more without pacing herself. It has made her face and neck more swollen/puffy, she feels that she is eating more.   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 She is on an aggressive PAH regimen. PFT support mixed disease. She benefited from Symbicort but insurance has rejected and says she needs to fail Advair. She has the Advair but hasn't tried it yet.   08/17/13  Acute OV    Complains of prod cough with yellow mucus, increased DOE, wheezing, runny/stuffy nose, HA, PND x3days.  Denies tightness, f/c/s, hemoptysis, nausea, vomiting.  Patient denies any chest pain, rash, joint swelling. Complains of a postnasal drip, and drainage. No recent travel. Patient was recently admitted last month for possible fluid overload . Symptoms improved with diuresis. She was also given empiric Levaquin for possible underlying pneumonia chest x-ray showed diffuse pulmonary fibrosis, with no acute changes noted  ROV 08/26/13 -- follow up  visit for stage 4 sarcoid and PAH, has been on ventavis and sildenifil 80 tid. Her family tells me today that her ventavis has been decreased; Dr Claris Gladden notes do not say this but instead that they are trying to change to Tyvaso (4x a day). The pt has been confused, agitated and was taken to ED this week. They found that she had a UTI which has been treated. The family is concerned that she is not safe to be alone right now. She is scheduled to see a psychologist.    Rachel [name] 12/03/13 -- stage 4 sarcoidosis, secondary PAH. She was  Just hospitalized x 2 for dyspnea, possible PNA, but largely volume overload. She is now on Tyvaso + sildenifil 80.  She is now improved s/p diuresis. She is having HA, possibly in association w her tyvaso.  She is on symbicort bid. C/o PND today.   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 She is on 5-6L/min continuous O2. She has pulsed O2 for exertion but this is inadequate - frequently desats to the 70's. She remains on sildenifil + tyvaso. She is having PND for last few weeks. She remains on pred 10 post-hospitalization.   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 We tried weaning her prednisone to off; about days after she stopped it she was back in the hospital. Noted to have interstitial infiltrates > lasix  was increased to 6m bid (from 40/29m. She is back on prednisone 36m44ms well.     Objective:   Physical Exam Filed Vitals:   02/15/14 1153  BP: 98/56  Pulse: 101  Height: _0  (1.6 m)  Weight: 167 lb 12.8 oz (76.114 kg)  SpO2: 82%    Gen: Pleasant, overwt, in no distress on O2,  normal affect  ENT: No lesions,  mouth clear,  oropharynx clear, no postnasal drip  Neck: No JVD, no TMG, no carotid bruits  Lungs: No use of accessory muscles, few B insp crackles, no wheezing   Cardiovascular: RRR, no m/r/g   Musculoskeletal: No deformities, no cyanosis or  clubbing  Neuro: alert, no agitation or confusion noted today.   Skin: Warm, no lesions or rashes   07/28/13 --   Study Conclusions  - Left ventricle: The cavity size was normal. Wall thickness was increased in a pattern of mild LVH. The estimated ejection fraction was 60%. Wall motion was normal; there were no regional wall motion abnormalities. Doppler parameters are consistent with abnormal left ventricular relaxation (grade 1 diastolic dysfunction). - Mitral valve: Flat closure of mitral valve. Trivial regurgitation. - Right ventricle: The cavity size was normal. Pacer wire or catheter noted in right ventricle. Systolic function was normal. - Right atrium: The atrium was mildly dilated    Assessment & Plan:  No problem-specific assessment & plan notes found for this encounter.

## 2014-02-15 NOTE — Patient Instructions (Signed)
Please continue your lasix 50m twice a day Continue your oxygen, tyvaso and revatio as you have been taking them Continue your prednisone 528mdaily for the next 14 days, then stop.  Follow up with Dr ByLamonte Sakain 3 weeks to discuss how you are doing after stopping the prednisone.

## 2014-02-16 ENCOUNTER — Telehealth: Payer: Self-pay | Admitting: Emergency Medicine

## 2014-02-16 ENCOUNTER — Other Ambulatory Visit: Payer: Self-pay | Admitting: Family Medicine

## 2014-02-16 MED ORDER — PREDNISONE 5 MG PO TABS: 5.0000 mg | ORAL_TABLET | Freq: Every day | ORAL | Status: DC

## 2014-02-16 NOTE — Telephone Encounter (Signed)
Per last OV note: Continue your prednisone 63m daily for the next 14 days, then stop. Pt states that she thought she had enough tablets left to complete 14 days but she does not, she only has 6. I sent in refill for other 8 tablets. JRandolph Bing CMA

## 2014-02-24 ENCOUNTER — Encounter (HOSPITAL_COMMUNITY): Payer: Self-pay

## 2014-03-06 NOTE — Progress Notes (Signed)
Patient ID: Rachel [name], female   DOB: 09-Jan-1961, 53 y.o.   MRN: [id number]  PCP: Dr. Casper [name] Primary Cardiologist: Dr. 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 She is now on Revatio 80 mg tid and and inhaled Iloprost.  She has been evaluated for heart/lung transplant at [facility] as an inpatient but needed to raise around $10,000 to continue evaluation and decided against it. She has been seeing Dr. Delton [name] for her sarcoid.     Admitted in 4/15 with acute bronchitis and possibly some CHF.  She was given antibiotics, IV Lasix, and prednisone for possible component of sarcoid flare. Admitted 8/5-11/13/13 with dyspnea and was thought to be combination of CHF, sarcoidosis and underlying PNA. Treated with levofloxacin and prednisone and lasix was increased to BID for 1 week post discharge. She was admitted again in 9/15 with hypoxic respiratory failure thought to be HCAP versus progression of sarcoid.  She was treated with steroids and antibiotics.  She remains on prednisone 10 mg daily. CTA chest in 9/15 showed honeycombing and fibrosis consistent with pulmonary sarcoidosis, no PE. Admitted 10/28-11/3 for SOB and was diuresed and sent home.   Has been admitted multiple times in the past 6 months for SOB. Patient usually does not have much fluid on board and has minimal diuresis. Last discharged 02/08/14 and d/c weight 167 lbs.   Follow up: Saw Dr. Delton [name] who continued prednisone taper. Presents to clinic today in acute distress. Reports she has had increased SOB since Friday. Weight is stable. Wearing 5-6 L O2 at home and reports O2 at home 60-80s. Thinks she may have some fluid on board. Appetite decreased. Walks around the house. Denies PND or CP. + dizziness when trying to catch her breath but reports does not last very long. Following a low salt diet and drinking less than 2L a day. Weight at home 163 lbs.   6 minute (4/15): 123 m.      RHC  (8/12): mean RA 2, PA 51/24, mean PA 35, mean PCWP 6, CI 3, PVR 5.1 WU.   Labs (11/10): BNP 91, creatinine 1.0, K 4  Labs (2/11): K 4.6, creatinine 0.86  Labs (3/11): BNP 58, K 4.2, creatinine 0.8  Labs (10/11): K 4.5, creatinine 0.8, BNP 76  Labs (7/13): LDL 102, K 4.1, creatinine 0.85 Labs (4/15): K 4.2, creatinine 0.76, BNP 467 Labs (9/15) K 3.7, creatinine 0.75  Allergies (verified):  1) ! * Steroids High Dose  2) Demerol  3) Ultram  4) Darvocet-N 100  5) Doxycycline   Past Medical History:  1. Sarcoidosis: Stage IV pulmonary sarcoid on home O2.  She has been referred to [facility] for lung transplant evaluation.  2. Cardiomyopathy: Cardiac MRI in 2004 with no evidence for infiltrative disease but EF 40%. Echo (9/10): EF 60%, severely dilated RV with moderately decreased systolic function, moderate RAE, PASP 83 mmHg.  Echo (1/12): EF 55%, grade I diastolic dysfunction, moderate biatrial enlargement, moderate RV dilation with normal RV systolic function, peak RV-RA gradient 34 mmHg.  Echo (8/13): EF 55-60%, mildly dilated RV with mild systolic dysfunction, PA systolic pressure 45 mmHg. Echo (4/15) with EF 60%, normal RV size/systolic function, unable to estimate PA systolic pressure.  3. Pulmonary hypertension with RV failure: Moderate to severe, likely secondary to sarcoidosis and parenchymal lung disease/hypoxic pulmonary vasoconstriction. RHC 5/10 showed PA 67/30 (mean 46) with mean PCWP 6 mmHg. Patient started on Revatio in 5/10  without any change in oxygen saturation. 6 minute walk 7/10: 61 m. Echo (9/10): EF 60%, severely dilated RV with moderately decreased systolic function, moderate RAE, PASP 83 mmHg. RHC (9/10) with mean RA 15, PA 74/36, CI 2.8. Repeat RHC after diuresis with mean RA 3, PA 60/22, mean PCWP 4. Echo (1/11): EF 55-60%, grade I diastolic dysfunction, moderately dilated RV, mild to moderate RV dysfunction, moderate to severe TR, PASP 58 mmHg. 6 minute walk (2/11): 122 m. 6 min  walk (6/11): 161.5 m. RHC (6/11): mean RA 11, RV 64/15, PA 63/28 mean 43, mean PCWP 14, CI 2.4 thermodilution and 3.2 Fick, PVR 5.3 WU Fick and 7.25 WU thermodilution. Iloprost begun. 6 min walk (10/11): 158 m. 6 min walk (1/12) 273 m.  6 min walk (7/12) 248 m.  RHC (8/12): mean RA 2, PA 51/24, mean PA 35, mean PCWP 6, CI 3, PVR 5.1 WU.  PASP 45 mmHg on 8/13 echo.  6 minute walk (4/14): 223 m. 6 minute walk (9/14) 299 m. 6 min walk (4/15) 123 m.  Echo (9/15) with EF 55-60%, moderately dilated RV with mildly decreased systolic function, PA systolic pressure 54.  4. 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 Given end stage lung disease and normalization of LV function, the device was not replaced at ERI and tachy therapies were turned off.  5. GERD  6. Chronic chest pain. LHC (5/10) with no angiographic coronary disease.  7. Allergic rhinitis,  8. h/o iron deficiency anemia.  9. H/o secondary amenorrhea/irregular menses,  10. Psychosis with high dose steroids.  11. SVT  12. Recurrent Hypokalemia  13. h/o Rotator cuff sprain  14. 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   ROS: All systems reviewed and negative except as per HPI.    Current Outpatient Prescriptions  Medication Sig Dispense Refill  . albuterol (PROAIR HFA) 108 (90 BASE) MCG/ACT inhaler Inhale 2 puffs into the lungs 4 (four) times daily. 18 g 11  . ARIPiprazole (ABILIFY) 2 MG tablet Take 1 tablet (2 mg total) by mouth every other day. 30 tablet 1  . aspirin EC 81 MG tablet Take 1 tablet (81 mg total) by mouth daily. 1  tablet 0  . benzonatate (TESSALON PERLES) 100 MG capsule Take 1 capsule (100 mg total) by mouth 2 (two) times daily as needed for cough. 30 capsule 0  . budesonide-formoterol (SYMBICORT) 160-4.5 MCG/ACT inhaler Inhale 1 puff into the lungs 2 (two) times daily. 1 Inhaler 3  . esomeprazole (NEXIUM) 40 MG capsule Take 1 capsule (40 mg total) by mouth every morning. 30 capsule 3  . furosemide (LASIX) 20 MG tablet Take 2 tablets (40mg ) every morning and take 2 tablets (40mg ) every evening. 90 tablet 0  . PARoxetine (PAXIL) 20 MG tablet TAKE ONE TABLET BY MOUTH ONCE DAILY 30 tablet 3  . potassium chloride SA (K-DUR,KLOR-CON) 20 MEQ tablet Take 1-2 tablets (20-40 mEq total) by mouth 2 (two) times daily. 40 meq in the morning and 20 meq at night 90 tablet 3  . sildenafil (REVATIO) 20 MG tablet Take  4 tablets (80 mg total) by mouth 3 (three) times daily. take 4  tablets every 8 hours 10 tablet 0  . Treprostinil (TYVASO) 0.6 MG/ML SOLN Inhale 18 mcg into the lungs 4 (four) times daily. 3.6 mL 11   No current facility-administered medications for this encounter.    BP 138/80 mmHg  Pulse 98  Wt 166 lb (75.297 kg)  SpO2 68% General: Well-developed,well-nourished, SOB coming into clinic; placed on NRB 15 liters and O2 came up to 92% Neck: Neck supple, JVP 7 cm. No masses, thyromegaly or abnormal cervical nodes.  Lungs: CTA throughout Heart: Non-displaced PMI, chest non-tender; regular rate and rhythm, S1, S2 without rubs or gallops. 1/6 systolic murmur LLSB. Loud P2. Carotid upstroke normal, no bruit. Pedals normal pulses. No edema Abdomen: Bowel sounds positive; abdomen soft and tender without masses, organomegaly, or hernias noted. No hepatosplenomegaly.  Extremities: No clubbing or cyanosis.  Neurologic: Alert and oriented x 3.  Psych: Normal affect.  Assessment/Plan:  PULMONARY HYPERTENSION  She has sarcoidosis but has pulmonary hypertension out of proportion to sarcoidosis (suspect WHO group 1  component).  She is currently on Tyvaso and Revatio. Last echo in 9/15 showed normal LV EF with moderately dilated RV and mildly reduced RV systolic function, PA systolic pressure 54 mmHg.  - NYHA class IV symptoms due to combination of pulmonary parenchymal disease and pulmonary hypertension. Volume status stable and weight down from discharge.  - Dr. Gala Romney had lengthy discussion with patient about prognosis and that she is moving towards Hospice if she can't get tx. Lungs are getting worse and patient may not even have a chance at tx but would like to see if we could get her evaluated. Patient is worried about cost and family support. Dr. Gala Romney called son and explained the extent of mothers condition. He explained that his mother does not have much time left and is very sick. He discussed the need to get her evaluated for possible lung tx, but that she needs a primary caretaker. Son reports he did not know his mother was this sick. We have asked him to please get family together to have a meeting to discuss who will be primary caretaker and to tell Dr.Byrym on Wednesday. If they can get a caretaker identified then we will refer to [facility] for possible lung tx. She maybe candidate for IV therapies but likely needs lung tx.  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 Even with this much O2 patients saturations are anywhere from 60-80%. Have asked her to wear 6L O2 all the time.  I suspect that this is the main source of her dyspnea.  She is on prednisone and will follow up with Dr. Delton [name] on Wednesday.  Chronic diastolic CHF with primarily RV failure:   Volume status appears stable. Continue lasix at current dose. Check BMET and proBNP today.   Ulla Potash B NP-C 03/07/2014  Patient seen and examined with Ulla Potash, NP. We discussed all aspects of the encounter. I agree with the assessment and plan as stated above.   She  continues to deteriorate with desaturations into the 60-70% range despite 6L [place]. Up to this point she has been unwilling to move forward with lung transplant evaluation due to lack of family support and need for fund raising. I had a long talk with about her getting worse and high mortality rate over the next year if we don't consider advanced therapies  and transplant. She is now willing to consider. With her permission, I also called her son, Rachel [name], to discuss how sick she is and the need for family support. He said he was unaware about how sick she had become. She will see Dr. Delton [name] this week and we will refer her back to transplant center for further consideration. May also be worth repeating her RHC to see if she may benefit from a careful trial of epoprostenol. I discussed with Dr. Delton [name] who agrees. I will arrange for RHC soon. We probably should repeat her PFTs as well.   Total time spent 45 minutes. Over half that time spent discussing above.   Samaa Ueda,MD 10:46 PM

## 2014-03-07 ENCOUNTER — Telehealth (HOSPITAL_COMMUNITY): Payer: Self-pay | Admitting: Surgery

## 2014-03-07 ENCOUNTER — Encounter (HOSPITAL_COMMUNITY): Payer: Self-pay

## 2014-03-07 ENCOUNTER — Ambulatory Visit (HOSPITAL_COMMUNITY)
Admission: RE | Admit: 2014-03-07 | Discharge: 2014-03-07 | Disposition: A | Discharge status: Home / Self Care | Payer: Medicare Other | Source: Ambulatory Visit | Attending: Internal Medicine | Admitting: Internal Medicine

## 2014-03-07 VITALS — BP 138/80 | HR 98 | Wt 166.0 lb

## 2014-03-07 DIAGNOSIS — J9621 Acute and chronic respiratory failure with hypoxia: Secondary | ICD-10-CM

## 2014-03-07 DIAGNOSIS — IMO0002 Reserved for concepts with insufficient information to code with codable children: Secondary | ICD-10-CM

## 2014-03-07 DIAGNOSIS — I429 Cardiomyopathy, unspecified: Secondary | ICD-10-CM | POA: Insufficient documentation

## 2014-03-07 DIAGNOSIS — I272 Other secondary pulmonary hypertension: Secondary | ICD-10-CM | POA: Insufficient documentation

## 2014-03-07 DIAGNOSIS — Z79899 Other long term (current) drug therapy: Secondary | ICD-10-CM | POA: Insufficient documentation

## 2014-03-07 DIAGNOSIS — Z9981 Dependence on supplemental oxygen: Secondary | ICD-10-CM | POA: Diagnosis not present

## 2014-03-07 DIAGNOSIS — I5032 Chronic diastolic (congestive) heart failure: Secondary | ICD-10-CM | POA: Diagnosis not present

## 2014-03-07 DIAGNOSIS — D869 Sarcoidosis, unspecified: Secondary | ICD-10-CM | POA: Insufficient documentation

## 2014-03-07 DIAGNOSIS — Z7982 Long term (current) use of aspirin: Secondary | ICD-10-CM | POA: Insufficient documentation

## 2014-03-07 DIAGNOSIS — R0602 Shortness of breath: Secondary | ICD-10-CM

## 2014-03-07 DIAGNOSIS — I5033 Acute on chronic diastolic (congestive) heart failure: Secondary | ICD-10-CM

## 2014-03-07 LAB — BASIC METABOLIC PANEL
Anion gap: 10 (ref 5–15)
BUN: 15 mg/dL (ref 6–23)
CALCIUM: 9.5 mg/dL (ref 8.4–10.5)
CO2: 30 mEq/L (ref 19–32)
CREATININE: 0.81 mg/dL (ref 0.50–1.10)
Chloride: 101 mEq/L (ref 96–112)
GFR calc non Af Amer: 81 mL/min — ABNORMAL LOW (ref >90)
Glucose, Bld: 127 mg/dL — ABNORMAL HIGH (ref 70–99)
Potassium: 4 mEq/L (ref 3.7–5.3)
Sodium: 141 mEq/L (ref 137–147)

## 2014-03-07 LAB — PRO B NATRIURETIC PEPTIDE: Pro B Natriuretic peptide (BNP): 1560 pg/mL — ABNORMAL HIGH (ref 0–125)

## 2014-03-07 NOTE — Telephone Encounter (Signed)
Called Rachel [name] about her lab results.   I requested that she take Extra Lasix 40 mg and Potassium 20 meq today.  She acknowledges understanding.

## 2014-03-07 NOTE — Progress Notes (Signed)
Quick Note:  Take extra 40 mg of lasix today and tomorrow with 20 meq extra KCL. ______

## 2014-03-07 NOTE — Patient Instructions (Signed)
Make sure you have someone identified for primary caregiver and have them go with you to your appointment on Wednesday and tell Dr.Byrum who that will be.   Once primary caretaker identified will refer to [facility] for lung transplant. We can help with making sure you have enough oxygen to get down there.   Follow up in 3-4 weeks.  Do the following things EVERYDAY: 1) Weigh yourself in the morning before breakfast. Write it down and keep it in a log. 2) Take your medicines as prescribed 3) Eat low salt foods-Limit salt (sodium) to 2000 mg per day.  4) Stay as active as you can everyday 5) Limit all fluids for the day to less than 2 liters 6)

## 2014-03-10 ENCOUNTER — Ambulatory Visit: Payer: Medicare Other | Admitting: Emergency Medicine

## 2014-03-10 VITALS — BP 130/88 | HR 93 | Ht 63.0 in

## 2014-03-10 DIAGNOSIS — I2729 Other secondary pulmonary hypertension: Secondary | ICD-10-CM

## 2014-03-10 MED ORDER — PREDNISONE 10 MG PO TABS: ORAL_TABLET | ORAL | Status: DC

## 2014-03-10 NOTE — Patient Instructions (Signed)
Please continue your Oxygen as you are using it Continue your lasix as ordered by Dr Haroldine Laws We will increase your prednisone to 81m for 3 weeks, then taper back down probably to 528mdaily. Decrease your prednisone to 2032mfter 3 weeks, then Dr ByrLamonte Sakaill discuss the next dosing with you.  We [facility].  Follow with Dr ByrLamonte Sakai 1 month

## 2014-03-10 NOTE — Progress Notes (Signed)
Subjective:    Patient ID: Rachel [name], female    DOB: January 30, 1961, 53 y.o.   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 She is now on Revatio 80 mg tid and and inhaled Iloprost (added in 2011), managed by Dr Rachel [name] at [place]. 6 min walk (7/12) 248 m. RHC (8/12): mean RA 2, PA 51/24, mean PA 35, mean PCWP 6, CI 3, PVR 5.1 WU. She believes that she is doing very well. She wears O2 at 6L/min at all times. She believes she knows when her sarcoidosis is active, remains on flovent.   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 Followed by Dr Rachel [name] > current PAH meds are Revatio 80 tid + Ventavis. Most recent echocardiogram in 01/07/13 showed estimated PASP 44 mmHg (stable), decreased EF.  Last time we did a trial of Symbicort > she feels that it has helped some, she is less SOB w exertion, able to do more without pacing herself. It has made her face and neck more swollen/puffy, she feels that she is eating more.   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 She is on an aggressive PAH regimen. PFT support mixed disease. She benefited from Symbicort but insurance has rejected and says she needs to fail Advair. She has the Advair but hasn't tried it yet.   08/17/13  Acute OV    Complains of prod cough with yellow mucus, increased DOE, wheezing, runny/stuffy nose, HA, PND x3days.  Denies tightness, f/c/s, hemoptysis, nausea, vomiting.  Patient denies any chest pain, rash, joint swelling. Complains of a postnasal drip, and drainage. No recent travel. Patient was recently admitted last month for possible fluid overload . Symptoms improved with diuresis. She was also given empiric Levaquin for possible underlying pneumonia chest x-ray showed diffuse pulmonary fibrosis, with no acute changes noted  ROV 08/26/13 -- follow up  visit for stage 4 sarcoid and PAH, has been on ventavis and sildenifil 80 tid. Her family tells me today that her ventavis has been decreased; Dr Rachel [name] notes do not say this but instead that they are trying to change to Tyvaso (4x a day). The pt has been confused, agitated and was taken to ED this week. They found that she had a UTI which has been treated. The family is concerned that she is not safe to be alone right now. She is scheduled to see a psychologist.    Rachel [name] 12/03/13 -- stage 4 sarcoidosis, secondary PAH. She was  Just hospitalized x 2 for dyspnea, possible PNA, but largely volume overload. She is now on Tyvaso + sildenifil 80.  She is now improved s/p diuresis. She is having HA, possibly in association w her tyvaso.  She is on symbicort bid. C/o PND today.   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 She is on 5-6L/min continuous O2. She has pulsed O2 for exertion but this is inadequate - frequently desats to the 70's. She remains on sildenifil + tyvaso. She is having PND for last few weeks. She remains on pred 10 post-hospitalization.   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 We tried weaning her prednisone to off; about days after she stopped it she was back in the hospital. Noted to have interstitial infiltrates > lasix  was increased to 56m bid (from 40/222m. She is back on prednisone 77m61ms well.    ROV 03/10/14 -- severe chronic hypoxemic resp failure in setting sarcoidosis, severe secondary PAH. Her sarcoidosis is stage IV with significant underlying scar. She's been admitted several times in the last year. Most recently she had groundglass infiltrates superimposed on her scarring that improved with diuresis. She was recently seen in the CHF clinic and discussed possible referral for lung transplantation. She is here today with her brother Rachel [name] her son Rachel [name] discuss further. She is  willing to be referred to undergo initial assessment. Rachel Kindreds agreed to be her primary designated caregiver.    Objective:   Physical Exam Filed Vitals:   03/10/14 1218 03/10/14 1220  BP: 130/88   Pulse: 96 93  Height: 5' 3" (1.6 m)   SpO2: 71% 85%    Gen: Pleasant, overwt, in no distress on O2,  normal affect  ENT: No lesions,  mouth clear,  oropharynx clear, no postnasal drip  Neck: No JVD, no TMG, no carotid bruits  Lungs: No use of accessory muscles, B insp crackles, no wheezing   Cardiovascular: RRR, no m/r/g   Musculoskeletal: No deformities, no cyanosis or clubbing  Neuro: alert, no agitation or confusion noted today.   Skin: Warm, no lesions or rashes   07/28/13 --   Study Conclusions  - Left ventricle: The cavity size was normal. Wall thickness was increased in a pattern of mild LVH. The estimated ejection fraction was 60%. Wall motion was normal; there were no regional wall motion abnormalities. Doppler parameters are consistent with abnormal left ventricular relaxation (grade 1 diastolic dysfunction). - Mitral valve: Flat closure of mitral valve. Trivial regurgitation. - Right ventricle: The cavity size was normal. Pacer wire or catheter noted in right ventricle. Systolic function was normal. - Right atrium: The atrium was mildly dilated    Assessment & Plan:  No problem-specific assessment & plan notes found for this encounter.

## 2014-03-14 ENCOUNTER — Telehealth: Payer: Self-pay | Admitting: Emergency Medicine

## 2014-03-14 NOTE — Telephone Encounter (Signed)
Called and spoke to pt. Pt wanted clarification of the pred taper that was given at the last OV. Informed pt of the directions of pred in detail. Pt verbalized understanding and denied any further questions or concerns at this time.

## 2014-03-24 ENCOUNTER — Other Ambulatory Visit: Payer: Self-pay | Admitting: Family Medicine

## 2014-03-25 ENCOUNTER — Telehealth: Payer: Self-pay | Admitting: Family Medicine

## 2014-03-25 NOTE — Telephone Encounter (Signed)
Refills on ambilify and furosemide [facility] on [address]  Please call pt when it is done

## 2014-03-25 NOTE — Telephone Encounter (Signed)
Tried calling pt a few times, phone number busy.  Rx for medication was sent in to pharmacy.  Derl Barrow, RN

## 2014-04-06 ENCOUNTER — Encounter (HOSPITAL_COMMUNITY): Payer: Self-pay

## 2014-04-21 ENCOUNTER — Ambulatory Visit (INDEPENDENT_AMBULATORY_CARE_PROVIDER_SITE_OTHER): Payer: Medicare Other | Admitting: Emergency Medicine

## 2014-04-21 ENCOUNTER — Encounter: Payer: Self-pay | Admitting: Emergency Medicine

## 2014-04-21 ENCOUNTER — Telehealth: Payer: Self-pay | Admitting: Emergency Medicine

## 2014-04-21 ENCOUNTER — Encounter: Payer: Self-pay | Admitting: *Deleted

## 2014-04-21 VITALS — BP 110/60 | HR 96

## 2014-04-21 DIAGNOSIS — I272 Other secondary pulmonary hypertension: Secondary | ICD-10-CM

## 2014-04-21 DIAGNOSIS — IMO0002 Reserved for concepts with insufficient information to code with codable children: Secondary | ICD-10-CM

## 2014-04-21 DIAGNOSIS — B370 Candidal stomatitis: Secondary | ICD-10-CM | POA: Insufficient documentation

## 2014-04-21 MED ORDER — NYSTATIN 100000 UNIT/ML MT SUSP: 5.0000 mL | Freq: Three times a day (TID) | OROMUCOSAL | Status: DC

## 2014-04-21 MED ORDER — FLUCONAZOLE 150 MG PO TABS: 150.0000 mg | ORAL_TABLET | Freq: Every day | ORAL | Status: DC

## 2014-04-21 NOTE — Assessment & Plan Note (Signed)
Will treat her with nystatin swish and spit solution and also with fluconazole

## 2014-04-21 NOTE — Assessment & Plan Note (Addendum)
She was noted to have severe desaturation at home with exertion to the 60s on 6 L/m of oxygen via her Oxymizer. I have asked her to increase her concentrator flow 05/16/2008 liters but she does not want to do this because the gas irritates her nose and throat. I explained the importance of her remaining adequately saturated. She understands that tells me that she is not going to turn her oxygen up that high. He is willing to turn it to 7 L/m to see if she can tolerate. We will continue other medications as ordered Including sildenafil and Tyvaso

## 2014-04-21 NOTE — Progress Notes (Signed)
Subjective:    Patient ID: Rachel [name], female    DOB: January 30, 1961, 54 y.o.   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 She is now on Revatio 80 mg tid and and inhaled Iloprost (added in 2011), managed by Dr Aundra Dubin at [place]. 6 min walk (7/12) 248 m. RHC (8/12): mean RA 2, PA 51/24, mean PA 35, mean PCWP 6, CI 3, PVR 5.1 WU. She believes that she is doing very well. She wears O2 at 6L/min at all times. She believes she knows when her sarcoidosis is active, remains on flovent.   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 Followed by Dr Aundra Dubin > current PAH meds are Revatio 80 tid + Ventavis. Most recent echocardiogram in 01/07/13 showed estimated PASP 44 mmHg (stable), decreased EF.  Last time we did a trial of Symbicort > she feels that it has helped some, she is less SOB w exertion, able to do more without pacing herself. It has made her face and neck more swollen/puffy, she feels that she is eating more.   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 She is on an aggressive PAH regimen. PFT support mixed disease. She benefited from Symbicort but insurance has rejected and says she needs to fail Advair. She has the Advair but hasn't tried it yet.   08/17/13  Acute OV    Complains of prod cough with yellow mucus, increased DOE, wheezing, runny/stuffy nose, HA, PND x3days.  Denies tightness, f/c/s, hemoptysis, nausea, vomiting.  Patient denies any chest pain, rash, joint swelling. Complains of a postnasal drip, and drainage. No recent travel. Patient was recently admitted last month for possible fluid overload . Symptoms improved with diuresis. She was also given empiric Levaquin for possible underlying pneumonia chest x-ray showed diffuse pulmonary fibrosis, with no acute changes noted  ROV 08/26/13 -- follow up  visit for stage 4 sarcoid and PAH, has been on ventavis and sildenifil 80 tid. Her family tells me today that her ventavis has been decreased; Dr Claris Gladden notes do not say this but instead that they are trying to change to Tyvaso (4x a day). The pt has been confused, agitated and was taken to ED this week. They found that she had a UTI which has been treated. The family is concerned that she is not safe to be alone right now. She is scheduled to see a psychologist.    Rachel [name] 12/03/13 -- stage 4 sarcoidosis, secondary PAH. She was  Just hospitalized x 2 for dyspnea, possible PNA, but largely volume overload. She is now on Tyvaso + sildenifil 80.  She is now improved s/p diuresis. She is having HA, possibly in association w her tyvaso.  She is on symbicort bid. C/o PND today.   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 She is on 5-6L/min continuous O2. She has pulsed O2 for exertion but this is inadequate - frequently desats to the 70's. She remains on sildenifil + tyvaso. She is having PND for last few weeks. She remains on pred 10 post-hospitalization.   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 We tried weaning her prednisone to off; about days after she stopped it she was back in the hospital. Noted to have interstitial infiltrates > lasix  was increased to 86m bid (from 40/264m. She is back on prednisone 46m42ms well.    ROV 03/10/14 -- severe chronic hypoxemic resp failure in setting sarcoidosis, severe secondary PAH. Her sarcoidosis is stage IV with significant underlying scar. She's been admitted several times in the last year. Most recently she had groundglass infiltrates superimposed on her scarring that improved with diuresis. She was recently seen in the CHF clinic and discussed possible referral for lung transplantation. She is here today with her brother [name] her son [name] discuss further. She is  willing to be referred to undergo initial assessment. TonNicole Kindreds agreed to be her primary designated caregiver.   ROV 04/21/14 -- follow up visit for severe chronic hypoxemic resp failure in setting sarcoidosis, severe secondary PAH. She has had labile volume status during hospital admission for diuresis on a few recent occasions. We have referred her for a transplant evaluation. She presents today profoundly hypoxemic - [facility] that her SpO2 was in 60-70's. This is similar to her exertional baseline. I increased her prednisone last time to see if she would benefit. Her breathing did benefit. She has thrush and is having trouble eating.    Objective:   Physical Exam Filed Vitals:   04/21/14 1518  BP: 110/60  Pulse: 96  SpO2: 86%    Gen: Pleasant, overwt, in no distress on O2,  normal affect  ENT: Her tongue is coated, white, thrush, oropharynx clear, no postnasal drip  Neck: No JVD, no TMG, no carotid bruits  Lungs: No use of accessory muscles, B insp crackles, no wheezing   Cardiovascular: RRR, no m/r/g   Musculoskeletal: No deformities, no cyanosis or clubbing  Neuro: alert, no agitation or confusion noted today.   Skin: Warm, no lesions or rashes   07/28/13 --   Study Conclusions  - Left ventricle: The cavity size was normal. Wall thickness was increased in a pattern of mild LVH. The estimated ejection fraction was 60%. Wall motion was normal; there were no regional wall motion abnormalities. Doppler parameters are consistent with abnormal left ventricular relaxation (grade 1 diastolic dysfunction). - Mitral valve: Flat closure of mitral valve. Trivial regurgitation. - Right ventricle: The cavity size was normal. Pacer wire or catheter noted in right ventricle. Systolic function was normal. - Right atrium: The atrium was mildly dilated    Assessment & Plan:  Secondary pulmonary hypertension She was noted to have severe desaturation at home  with exertion to the 60s on 6 L/m of oxygen via her Oxymizer. I have asked her to increase her concentrator flow 05/16/2008 liters but she does not want to do this because the gas irritates her nose and throat. I explained the importance of her remaining adequately saturated. She understands that tells me that she is not going to turn her oxygen up that high. He is willing to turn it to 7 L/m to see if she can tolerate. We will continue other medications as ordered Including sildenafil and Tyvaso   PULMONARY SARCOIDOSIS She appears to benefit from the prednisone increase. She was probably more active when she was on prednisone 30 mg daily according to her brother. She is currently on 20 mg daily and does not want to go back up to 30 due to significant side effects.    ThrRitta Slotll treat her with nystatin swish and spit solution and also with fluconazole

## 2014-04-21 NOTE — Telephone Encounter (Signed)
Per RB - pt is to keep appointment today, he is going to discuss Hospice with her.

## 2014-04-21 NOTE — Telephone Encounter (Signed)
Spoke with Ilona Sorrel, home health nurse. States that pt's O2 levels have been running in the 60's on 6L/min. Advised Pattie that pt needs to go to the ER and was told that pt would not do that. Pt has appointment today with RB at 3:00pm.  I called and spoke with pt and advised her that she needed to go to the ER. She started arguing with me and told me that she was not going to do that. Stated, "This always happens." I will discuss this with Dr. Lamonte Sakai when he gets here.

## 2014-04-21 NOTE — Patient Instructions (Addendum)
Please try increasing her oxygen to 7 L/m to see if he can tolerate this. We need to keep your oxygen saturations greater than 90 for as much of the time as possible. If you are able to tolerate this flow rate then we should strongly consider increasing it further Continue your prednisone 20 mg daily Take fluconazole and nystatin as directed Continue her other medications as you have been taking them including your Revatio and Tyvaso Follow with Dr Lamonte Sakai next available opening

## 2014-04-21 NOTE — Assessment & Plan Note (Signed)
She appears to benefit from the prednisone increase. She was probably more active when she was on prednisone 30 mg daily according to her brother. She is currently on 20 mg daily and does not want to go back up to 30 due to significant side effects.

## 2014-04-22 ENCOUNTER — Telehealth: Payer: Self-pay | Admitting: Emergency Medicine

## 2014-04-22 MED ORDER — PREDNISONE 10 MG PO TABS: 20.0000 mg | ORAL_TABLET | Freq: Every day | ORAL | Status: DC

## 2014-04-22 NOTE — Telephone Encounter (Signed)
LMTCB- need to get directions of prednisone dose for patient per sig on Prednisone Rx.

## 2014-04-22 NOTE — Telephone Encounter (Signed)
Spoke with the pt  She is asking for refill on pred  According to last note needs to remain on 20 mg daily until next ov  Rx was sent  Nothing further needed per pt

## 2014-04-22 NOTE — Telephone Encounter (Signed)
Patient Instructions     Please try increasing her oxygen to 7 L/m to see if he can tolerate this. We need to keep your oxygen saturations greater than 90 for as much of the time as possible. If you are able to tolerate this flow rate then we should strongly consider increasing it further Continue your prednisone 20 mg daily Take fluconazole and nystatin as directed Continue her other medications as you have been taking them including your Revatio and Tyvaso Follow with Dr Lamonte Sakai next available opening   AVS per 04-21-14 visit with RB.

## 2014-05-03 ENCOUNTER — Telehealth (HOSPITAL_COMMUNITY): Payer: Self-pay | Admitting: Vascular Surgery

## 2014-05-03 NOTE — Telephone Encounter (Signed)
Pt want someone to call her back about Rivatio please advise

## 2014-05-17 ENCOUNTER — Telehealth: Payer: Self-pay | Admitting: Family Medicine

## 2014-05-17 NOTE — Telephone Encounter (Signed)
Pt has gained 12 pounds in 2 weeks. Stomach has increased 3 cm, ankle is 1 cm larger. There is wheezing in her lungs Please advise

## 2014-05-17 NOTE — Telephone Encounter (Signed)
Called [facility] RN to discuss. Per RN, pt was in no more distress than usual, though her O2 sats are typical for her (she is always hypoxic even with O2). Pt does not routinely check her pulse ox at home herself, because it generates a phone call to her when it is low and it is "always low," so per RN, "she [pt] stopped doing it." She is however compliant with daily telemonitored weights and blood pressures.   RN usually sees pt once weekly but will see her again on Thursday, and pt has appt with pulmonlogist on Friday. Advised increasing Lasix to 80 mg BID (up from 40 mg BID at baseline) for 2-3 days, and if she has any clinical worsening tomorrow or Thursday, pt should proceed to the ED for immediate evaluation. Otherwise will plan for her to be seen by pulm on Friday, or here in clinic PRN. RN will communicate with pt and call back with any new issues. Appreciate assistance with [facility]!!! --CMS

## 2014-05-17 NOTE — Telephone Encounter (Signed)
Return call to Graham, [facility] Nurse.  Pattie stated that patient has gained 5 lbs over the last 3-4 days, waist 3 cm larger and ankles 1 cm larger than last visit.  Pt has some expiratory wheezing, 6 Liters of O2 and O2 sats 77% drops to 60's when walking.  Pt is coughing up spectrum.  Tried calling pt; phone number is busy.  Please give Pattie a call with questions [phone]. Derl Barrow, RN

## 2014-05-20 ENCOUNTER — Encounter (HOSPITAL_COMMUNITY): Payer: Self-pay | Admitting: Cardiology

## 2014-05-20 ENCOUNTER — Ambulatory Visit (HOSPITAL_COMMUNITY)
Admission: RE | Admit: 2014-05-20 | Discharge: 2014-05-20 | Disposition: A | Discharge status: Home / Self Care | Payer: Medicare Other | Source: Ambulatory Visit | Attending: Adult Health | Admitting: Adult Health

## 2014-05-20 VITALS — BP 112/64 | Wt 168.5 lb

## 2014-05-20 DIAGNOSIS — Z9981 Dependence on supplemental oxygen: Secondary | ICD-10-CM | POA: Diagnosis not present

## 2014-05-20 DIAGNOSIS — I5032 Chronic diastolic (congestive) heart failure: Secondary | ICD-10-CM | POA: Diagnosis not present

## 2014-05-20 DIAGNOSIS — Z7982 Long term (current) use of aspirin: Secondary | ICD-10-CM | POA: Insufficient documentation

## 2014-05-20 DIAGNOSIS — D869 Sarcoidosis, unspecified: Secondary | ICD-10-CM

## 2014-05-20 DIAGNOSIS — I429 Cardiomyopathy, unspecified: Secondary | ICD-10-CM | POA: Diagnosis not present

## 2014-05-20 DIAGNOSIS — Z87891 Personal history of nicotine dependence: Secondary | ICD-10-CM | POA: Insufficient documentation

## 2014-05-20 DIAGNOSIS — I272 Other secondary pulmonary hypertension: Secondary | ICD-10-CM | POA: Insufficient documentation

## 2014-05-20 DIAGNOSIS — K219 Gastro-esophageal reflux disease without esophagitis: Secondary | ICD-10-CM | POA: Diagnosis not present

## 2014-05-20 DIAGNOSIS — Z79899 Other long term (current) drug therapy: Secondary | ICD-10-CM | POA: Diagnosis not present

## 2014-05-20 DIAGNOSIS — Z7952 Long term (current) use of systemic steroids: Secondary | ICD-10-CM | POA: Insufficient documentation

## 2014-05-20 DIAGNOSIS — D860 Sarcoidosis of lung: Secondary | ICD-10-CM | POA: Diagnosis not present

## 2014-05-20 DIAGNOSIS — IMO0002 Reserved for concepts with insufficient information to code with codable children: Secondary | ICD-10-CM

## 2014-05-20 LAB — BASIC METABOLIC PANEL
ANION GAP: 12 (ref 5–15)
BUN: 22 mg/dL (ref 6–23)
CALCIUM: 9.5 mg/dL (ref 8.4–10.5)
CO2: 31 mmol/L (ref 19–32)
Chloride: 100 mmol/L (ref 96–112)
Creatinine, Ser: 1.04 mg/dL (ref 0.50–1.10)
GFR calc Af Amer: 70 mL/min — ABNORMAL LOW (ref >90)
GFR calc non Af Amer: 60 mL/min — ABNORMAL LOW (ref >90)
GLUCOSE: 123 mg/dL — AB (ref 70–99)
Potassium: 4.1 mmol/L (ref 3.5–5.1)
SODIUM: 143 mmol/L (ref 135–145)

## 2014-05-20 LAB — BRAIN NATRIURETIC PEPTIDE: B Natriuretic Peptide: 314.6 pg/mL — ABNORMAL HIGH (ref 0.0–100.0)

## 2014-05-20 MED ORDER — FUROSEMIDE 20 MG PO TABS: 60.0000 mg | ORAL_TABLET | Freq: Two times a day (BID) | ORAL | Status: AC

## 2014-05-20 NOTE — Patient Instructions (Signed)
CONTINUE Lasix 60 mg twice a day  Labs today  Your physician has requested that you have a cardiac catheterization. Cardiac catheterization is used to diagnose and/or treat various heart conditions. Doctors may recommend this procedure for a number of different reasons. The most common reason is to evaluate chest pain. Chest pain can be a symptom of coronary artery disease (CAD), and cardiac catheterization can show whether plaque is narrowing or blocking your heart's arteries. This procedure is also used to evaluate the valves, as well as measure the blood flow and oxygen levels in different parts of your heart. For further information please visit [email or web address]. Please follow instruction sheet, as given.  Your physician recommends that you schedule a follow-up appointment in: 2 months

## 2014-05-22 NOTE — Progress Notes (Signed)
Patient ID: Rachel [name], female   DOB: 1960-05-27, 54 y.o.   MRN: [id number] PCP: Dr. Casper Harrison Primary Cardiologist: Dr. 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 She is now on Revatio 80 mg tid and and inhaled Iloprost.  She has been evaluated for heart/lung transplant at [facility] as an inpatient but needed to raise around $10,000 to continue evaluation and decided against it. She has been seeing Dr. Delton Coombes for her sarcoid.     Admitted in 4/15 with acute bronchitis and possibly some CHF.  She was given antibiotics, IV Lasix, and prednisone for possible component of sarcoid flare. Admitted 8/5-11/13/13 with dyspnea and was thought to be combination of CHF, sarcoidosis and underlying PNA. Treated with levofloxacin and prednisone and lasix was increased to BID for 1 week post discharge. She was admitted again in 9/15 with hypoxic respiratory failure thought to be HCAP versus progression of sarcoid.  She was treated with steroids and antibiotics.  CTA chest in 9/15 showed honeycombing and fibrosis consistent with pulmonary sarcoidosis, no PE. Admitted 10/28-11/3/15 for SOB and was diuresed and sent home.   Has been admitted multiple times in the past 6 months for SOB. Patient usually does not have much fluid on board and has minimal diuresis. Last discharged 02/08/14 and d/c weight 167 lbs. She is on prednisone 20 mg daily.   She remains on 5-6 liters home oxygen, oxygen saturation is 87%.  This is typical for her (runs high 80s-around 90).  She is short of breath walking from room to room in her house.  This is chronic now. She has chest pain only with coughing.  She has chronic cough with occasional yellow sputum.  At last appointment her, we wanted to try to get her considered again for lung transplantation.  Her son agreed to be her primary caregiver.    Labs (11/10): BNP 91, creatinine 1.0, K 4  Labs (2/11): K 4.6, creatinine 0.86  Labs  (3/11): BNP 58, K 4.2, creatinine 0.8  Labs (10/11): K 4.5, creatinine 0.8, BNP 76  Labs (7/13): LDL 102, K 4.1, creatinine 0.85 Labs (4/15): K 4.2, creatinine 0.76, BNP 467 Labs (9/15) K 3.7, creatinine 0.75 Labs (11/15): K 4, creatinine 0.81  Allergies (verified):  1) ! * Steroids High Dose  2) Demerol  3) Ultram  4) Darvocet-N 100  5) Doxycycline   Past Medical History:  1. Sarcoidosis: Stage IV pulmonary sarcoid on home O2.  She was referred to [facility] in the past for lung transplant evaluation.  2. Cardiomyopathy: Cardiac MRI in 2004 with no evidence for infiltrative disease but EF 40%. Echo (9/10): EF 60%, severely dilated RV with moderately decreased systolic function, moderate RAE, PASP 83 mmHg.  Echo (1/12): EF 55%, grade I diastolic dysfunction, moderate biatrial enlargement, moderate RV dilation with normal RV systolic function, peak RV-RA gradient 34 mmHg.  Echo (8/13): EF 55-60%, mildly dilated RV with mild systolic dysfunction, PA systolic pressure 45 mmHg. Echo (4/15) with EF 60%, normal RV size/systolic function, unable to estimate PA systolic pressure.  3. Pulmonary hypertension with RV failure: Moderate to severe, likely secondary to sarcoidosis and parenchymal lung disease/hypoxic pulmonary vasoconstriction. RHC 5/10 showed PA 67/30 (mean 46) with mean PCWP 6 mmHg. Patient started on Revatio in 5/10 without any change in oxygen saturation. 6 minute walk 7/10: 61 m. Echo (9/10): EF 60%, severely dilated RV with moderately decreased systolic function, moderate RAE, PASP 83 mmHg.  RHC (9/10) with mean RA 15, PA 74/36, CI 2.8. Repeat RHC after diuresis with mean RA 3, PA 60/22, mean PCWP 4. Echo (1/11): EF 55-60%, grade I diastolic dysfunction, moderately dilated RV, mild to moderate RV dysfunction, moderate to severe TR, PASP 58 mmHg. 6 minute walk (2/11): 122 m. 6 min walk (6/11): 161.5 m. RHC (6/11): mean RA 11, RV 64/15, PA 63/28 mean 43, mean PCWP 14, CI 2.4 thermodilution and 3.2  Fick, PVR 5.3 WU Fick and 7.25 WU thermodilution. Iloprost begun. 6 min walk (10/11): 158 m. 6 min walk (1/12) 273 m.  6 min walk (7/12) 248 m.  RHC (8/12): mean RA 2, PA 51/24, mean PA 35, mean PCWP 6, CI 3, PVR 5.1 WU.  PASP 45 mmHg on 8/13 echo.  6 minute walk (4/14): 223 m. 6 minute walk (9/14) 299 m. 6 min walk (4/15) 123 m.  Echo (9/15) with EF 55-60%, moderately dilated RV with mildly decreased systolic function, PA systolic pressure 54.  4. 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 Given end stage lung disease and normalization of LV function, the device was not replaced at ERI and tachy therapies were turned off.  5. GERD  6. Chronic chest pain. LHC (5/10) with no angiographic coronary disease.  7. Allergic rhinitis,  8. h/o iron deficiency anemia.  9. H/o secondary amenorrhea/irregular menses,  10. Psychosis with high dose steroids.  11. SVT  12. Recurrent Hypokalemia  13. h/o Rotator cuff sprain  14. 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   ROS: All systems reviewed and negative except as per HPI.    Current Outpatient Prescriptions  Medication Sig Dispense Refill  . albuterol (PROAIR HFA) 108 (90 BASE) MCG/ACT inhaler Inhale 2 puffs into the lungs 4 (four) times daily. 18 g 11  . ARIPiprazole (ABILIFY) 2 MG tablet TAKE ONE TABLET BY MOUTH EVERY OTHER DAY 30 tablet 3  . aspirin EC 81 MG tablet Take 1 tablet (81 mg total) by mouth daily. 1 tablet 0  . benzonatate (TESSALON PERLES) 100 MG capsule Take 1 capsule (100 mg total) by mouth 2 (two) times daily as needed for  cough. 30 capsule 0  . budesonide-formoterol (SYMBICORT) 160-4.5 MCG/ACT inhaler Inhale 1 puff into the lungs 2 (two) times daily. 1 Inhaler 3  . esomeprazole (NEXIUM) 40 MG capsule Take 1 capsule (40 mg total) by mouth every morning. 30 capsule 3  . furosemide (LASIX) 20 MG tablet Take 3 tablets (60 mg total) by mouth 2 (two) times daily. 90 tablet 3  . nystatin (MYCOSTATIN) 100000 UNIT/ML suspension Use as directed 5 mLs (500,000 Units total) in the mouth or throat 3 (three) times daily. For 5 days 75 mL 0  . PARoxetine (PAXIL) 20 MG tablet TAKE ONE TABLET BY MOUTH ONCE DAILY 30 tablet 3  . potassium chloride SA (K-DUR,KLOR-CON) 20 MEQ tablet Take 1-2 tablets (20-40 mEq total) by mouth 2 (two) times daily. 40 meq in the morning and 20 meq at night 90 tablet 3  . predniSONE (DELTASONE) 10 MG tablet Take 2 tablets (20  mg total) by mouth daily. 60 tablet 1  . sildenafil (REVATIO) 20 MG tablet Take 4 tablets (80 mg total) by mouth 3 (three) times daily. take 4  tablets every 8 hours 10 tablet 0  . Treprostinil (TYVASO) 0.6 MG/ML SOLN Inhale 18 mcg into the lungs 4 (four) times daily. 3.6 mL 11   No current facility-administered medications for this encounter.    BP 112/64 mmHg  Wt 168 lb 8 oz (76.431 kg) General: Well-developed,well-nourished, SOB coming into clinic Neck: Neck supple, JVP 8 cm. No masses, thyromegaly or abnormal cervical nodes.  Lungs: CTA throughout Heart: Non-displaced PMI, chest non-tender; regular rate and rhythm, S1, S2 with right-sided S3. 1/6 systolic murmur LLSB. Loud P2. Carotid upstroke normal, no bruit. Pedals normal pulses. Trace bilateral ankle edema Abdomen: Bowel sounds positive; abdomen soft and tender without masses, organomegaly, or hernias noted. No hepatosplenomegaly.  Extremities: No clubbing or cyanosis.  Neurologic: Alert and oriented x 3.  Psych: Normal affect.  Assessment/Plan:  PULMONARY HYPERTENSION  She has sarcoidosis but has pulmonary  hypertension that may be out of proportion to sarcoidosis (suspect WHO group 1 component along with WHO group 3 disease).  She is currently on Tyvaso and Revatio. Last echo in 9/15 showed normal LV EF with moderately dilated RV and mildly reduced RV systolic function, PA systolic pressure 54 mmHg. NYHA class IV symptoms due to combination of pulmonary parenchymal disease and pulmonary hypertension.  - As we are contemplating referral again for transplant evaluation, I will arrange for RHC.   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 Even with this much O2, patient's saturations are in the 80s. Have asked her to wear 6L O2 all the time.  I suspect that this is the main source of her dyspnea.  She is on prednisone 20 mg daily and follows closely with Dr Delton Coombes. - Son is willing to be primary caregiver, and it looks like she has more of a support system than in the past.  I think it would be reasonable to send her to [facility] for lung transplant evaluation.  This was discussed back in 11/15.  I will try to contact Dr Delton Coombes about the status of this.  Chronic diastolic CHF with primarily RV failure:   Stable, continue current Lasix. BNP/BMET today.   Katyra Tomassetti,MD 05/22/2014

## 2014-05-23 ENCOUNTER — Other Ambulatory Visit: Payer: Self-pay | Admitting: *Deleted

## 2014-05-26 ENCOUNTER — Telehealth: Payer: Self-pay | Admitting: Emergency Medicine

## 2014-05-26 NOTE — Telephone Encounter (Signed)
Called spoke with pt. She wanted to know how long she continues on her pred at 20 mg. I made her aware according to last phone note until her next OV. She voiced understanding and needed nothing further

## 2014-05-30 ENCOUNTER — Emergency Department (HOSPITAL_COMMUNITY): Payer: Medicare Other

## 2014-05-30 ENCOUNTER — Inpatient Hospital Stay (HOSPITAL_COMMUNITY)
Admission: EM | Admit: 2014-05-30 | Discharge: 2014-06-07 | DRG: 189 | Disposition: A | Discharge status: Hospice | Payer: Medicare Other | Attending: Family Medicine | Admitting: Family Medicine

## 2014-05-30 ENCOUNTER — Encounter (HOSPITAL_COMMUNITY): Payer: Self-pay | Admitting: *Deleted

## 2014-05-30 DIAGNOSIS — I248 Other forms of acute ischemic heart disease: Secondary | ICD-10-CM | POA: Diagnosis present

## 2014-05-30 DIAGNOSIS — I272 Other secondary pulmonary hypertension: Secondary | ICD-10-CM | POA: Diagnosis present

## 2014-05-30 DIAGNOSIS — R0602 Shortness of breath: Secondary | ICD-10-CM

## 2014-05-30 DIAGNOSIS — K219 Gastro-esophageal reflux disease without esophagitis: Secondary | ICD-10-CM | POA: Diagnosis present

## 2014-05-30 DIAGNOSIS — T380X5A Adverse effect of glucocorticoids and synthetic analogues, initial encounter: Secondary | ICD-10-CM | POA: Diagnosis present

## 2014-05-30 DIAGNOSIS — K761 Chronic passive congestion of liver: Secondary | ICD-10-CM | POA: Diagnosis present

## 2014-05-30 DIAGNOSIS — R05 Cough: Secondary | ICD-10-CM | POA: Diagnosis present

## 2014-05-30 DIAGNOSIS — J9621 Acute and chronic respiratory failure with hypoxia: Principal | ICD-10-CM | POA: Diagnosis present

## 2014-05-30 DIAGNOSIS — B349 Viral infection, unspecified: Secondary | ICD-10-CM | POA: Diagnosis present

## 2014-05-30 DIAGNOSIS — I420 Dilated cardiomyopathy: Secondary | ICD-10-CM | POA: Diagnosis present

## 2014-05-30 DIAGNOSIS — J439 Emphysema, unspecified: Secondary | ICD-10-CM | POA: Diagnosis present

## 2014-05-30 DIAGNOSIS — D509 Iron deficiency anemia, unspecified: Secondary | ICD-10-CM | POA: Diagnosis present

## 2014-05-30 DIAGNOSIS — Z95810 Presence of automatic (implantable) cardiac defibrillator: Secondary | ICD-10-CM

## 2014-05-30 DIAGNOSIS — Z66 Do not resuscitate: Secondary | ICD-10-CM | POA: Diagnosis not present

## 2014-05-30 DIAGNOSIS — I2609 Other pulmonary embolism with acute cor pulmonale: Secondary | ICD-10-CM

## 2014-05-30 DIAGNOSIS — Z515 Encounter for palliative care: Secondary | ICD-10-CM | POA: Diagnosis not present

## 2014-05-30 DIAGNOSIS — Z7952 Long term (current) use of systemic steroids: Secondary | ICD-10-CM | POA: Diagnosis not present

## 2014-05-30 DIAGNOSIS — Z87891 Personal history of nicotine dependence: Secondary | ICD-10-CM | POA: Diagnosis not present

## 2014-05-30 DIAGNOSIS — Z9981 Dependence on supplemental oxygen: Secondary | ICD-10-CM | POA: Diagnosis not present

## 2014-05-30 DIAGNOSIS — I5033 Acute on chronic diastolic (congestive) heart failure: Secondary | ICD-10-CM | POA: Diagnosis present

## 2014-05-30 DIAGNOSIS — E876 Hypokalemia: Secondary | ICD-10-CM | POA: Diagnosis present

## 2014-05-30 DIAGNOSIS — Z7951 Long term (current) use of inhaled steroids: Secondary | ICD-10-CM

## 2014-05-30 DIAGNOSIS — I472 Ventricular tachycardia: Secondary | ICD-10-CM | POA: Diagnosis present

## 2014-05-30 DIAGNOSIS — D860 Sarcoidosis of lung: Secondary | ICD-10-CM | POA: Diagnosis not present

## 2014-05-30 DIAGNOSIS — Z79899 Other long term (current) drug therapy: Secondary | ICD-10-CM

## 2014-05-30 DIAGNOSIS — R0902 Hypoxemia: Secondary | ICD-10-CM | POA: Diagnosis not present

## 2014-05-30 DIAGNOSIS — I10 Essential (primary) hypertension: Secondary | ICD-10-CM | POA: Diagnosis present

## 2014-05-30 DIAGNOSIS — Z7982 Long term (current) use of aspirin: Secondary | ICD-10-CM | POA: Diagnosis not present

## 2014-05-30 DIAGNOSIS — F418 Other specified anxiety disorders: Secondary | ICD-10-CM | POA: Diagnosis present

## 2014-05-30 DIAGNOSIS — Z8249 Family history of ischemic heart disease and other diseases of the circulatory system: Secondary | ICD-10-CM | POA: Diagnosis not present

## 2014-05-30 DIAGNOSIS — D869 Sarcoidosis, unspecified: Secondary | ICD-10-CM | POA: Diagnosis present

## 2014-05-30 DIAGNOSIS — I2781 Cor pulmonale (chronic): Secondary | ICD-10-CM | POA: Diagnosis present

## 2014-05-30 DIAGNOSIS — M549 Dorsalgia, unspecified: Secondary | ICD-10-CM | POA: Diagnosis present

## 2014-05-30 DIAGNOSIS — G8929 Other chronic pain: Secondary | ICD-10-CM | POA: Diagnosis present

## 2014-05-30 DIAGNOSIS — Z5329 Procedure and treatment not carried out because of patient's decision for other reasons: Secondary | ICD-10-CM | POA: Diagnosis present

## 2014-05-30 DIAGNOSIS — Y95 Nosocomial condition: Secondary | ICD-10-CM | POA: Diagnosis present

## 2014-05-30 DIAGNOSIS — N179 Acute kidney failure, unspecified: Secondary | ICD-10-CM | POA: Diagnosis not present

## 2014-05-30 DIAGNOSIS — I2729 Other secondary pulmonary hypertension: Secondary | ICD-10-CM

## 2014-05-30 DIAGNOSIS — F19959 Other psychoactive substance use, unspecified with psychoactive substance-induced psychotic disorder, unspecified: Secondary | ICD-10-CM | POA: Diagnosis present

## 2014-05-30 LAB — TROPONIN I
Troponin I: 0.04 ng/mL — ABNORMAL HIGH (ref <0.031)
Troponin I: 0.04 ng/mL — ABNORMAL HIGH (ref <0.031)

## 2014-05-30 LAB — CBC
HCT: 34.9 % — ABNORMAL LOW (ref 36.0–46.0)
HEMOGLOBIN: 10.3 g/dL — AB (ref 12.0–15.0)
MCH: 25.8 pg — AB (ref 26.0–34.0)
MCHC: 29.5 g/dL — AB (ref 30.0–36.0)
MCV: 87.5 fL (ref 78.0–100.0)
Platelets: 348 10*3/uL (ref 150–400)
RBC: 3.99 MIL/uL (ref 3.87–5.11)
RDW: 19.2 % — AB (ref 11.5–15.5)
WBC: 11.5 10*3/uL — AB (ref 4.0–10.5)

## 2014-05-30 LAB — COMPREHENSIVE METABOLIC PANEL
ALT: 28 U/L (ref 0–35)
AST: 25 U/L (ref 0–37)
Albumin: 3.3 g/dL — ABNORMAL LOW (ref 3.5–5.2)
Alkaline Phosphatase: 68 U/L (ref 39–117)
Anion gap: 7 (ref 5–15)
BUN: 15 mg/dL (ref 6–23)
CALCIUM: 9.1 mg/dL (ref 8.4–10.5)
CO2: 35 mmol/L — ABNORMAL HIGH (ref 19–32)
CREATININE: 1.04 mg/dL (ref 0.50–1.10)
Chloride: 101 mmol/L (ref 96–112)
GFR calc Af Amer: 70 mL/min — ABNORMAL LOW (ref >90)
GFR calc non Af Amer: 60 mL/min — ABNORMAL LOW (ref >90)
GLUCOSE: 105 mg/dL — AB (ref 70–99)
POTASSIUM: 4.5 mmol/L (ref 3.5–5.1)
Sodium: 143 mmol/L (ref 135–145)
TOTAL PROTEIN: 6.6 g/dL (ref 6.0–8.3)
Total Bilirubin: 0.8 mg/dL (ref 0.3–1.2)

## 2014-05-30 LAB — I-STAT CG4 LACTIC ACID, ED
LACTIC ACID, VENOUS: 1.21 mmol/L (ref 0.5–2.0)
Lactic Acid, Venous: 1.52 mmol/L (ref 0.5–2.0)

## 2014-05-30 LAB — URINE MICROSCOPIC-ADD ON

## 2014-05-30 LAB — URINALYSIS, ROUTINE W REFLEX MICROSCOPIC
BILIRUBIN URINE: NEGATIVE
Glucose, UA: NEGATIVE mg/dL
Hgb urine dipstick: NEGATIVE
KETONES UR: NEGATIVE mg/dL
Leukocytes, UA: NEGATIVE
Nitrite: NEGATIVE
PROTEIN: 30 mg/dL — AB
Specific Gravity, Urine: 1.025 (ref 1.005–1.030)
UROBILINOGEN UA: 2 mg/dL — AB (ref 0.0–1.0)
pH: 8 (ref 5.0–8.0)

## 2014-05-30 LAB — I-STAT TROPONIN, ED: TROPONIN I, POC: 0.03 ng/mL (ref 0.00–0.08)

## 2014-05-30 LAB — TSH: TSH: 1.048 u[IU]/mL (ref 0.350–4.500)

## 2014-05-30 LAB — MRSA PCR SCREENING: MRSA BY PCR: NEGATIVE

## 2014-05-30 LAB — BRAIN NATRIURETIC PEPTIDE: B Natriuretic Peptide: 533.5 pg/mL — ABNORMAL HIGH (ref 0.0–100.0)

## 2014-05-30 LAB — LIPASE, BLOOD: Lipase: 21 U/L (ref 11–59)

## 2014-05-30 MED ORDER — CETYLPYRIDINIUM CHLORIDE 0.05 % MT LIQD
7.0000 mL | Freq: Two times a day (BID) | OROMUCOSAL | Status: DC
  Administered 2014-05-30 – 2014-06-06 (×14): 7 mL via OROMUCOSAL

## 2014-05-30 MED ORDER — HEPARIN SODIUM (PORCINE) 5000 UNIT/ML IJ SOLN
5000.0000 [IU] | Freq: Three times a day (TID) | INTRAMUSCULAR | Status: DC
  Administered 2014-05-30 – 2014-05-31 (×3): 5000 [IU] via SUBCUTANEOUS
  Filled 2014-05-30 (×5): qty 1

## 2014-05-30 MED ORDER — ONDANSETRON HCL 4 MG/2ML IJ SOLN
4.0000 mg | Freq: Four times a day (QID) | INTRAMUSCULAR | Status: DC | PRN
  Administered 2014-05-30 – 2014-06-03 (×7): 4 mg via INTRAVENOUS
  Filled 2014-05-30 (×7): qty 2

## 2014-05-30 MED ORDER — SODIUM CHLORIDE 0.9 % IJ SOLN
3.0000 mL | Freq: Two times a day (BID) | INTRAMUSCULAR | Status: DC
  Administered 2014-05-30 – 2014-06-06 (×8): 3 mL via INTRAVENOUS

## 2014-05-30 MED ORDER — DEXTROSE 5 % IV SOLN
1.0000 g | Freq: Three times a day (TID) | INTRAVENOUS | Status: DC
  Administered 2014-05-30 – 2014-05-31 (×4): 1 g via INTRAVENOUS
  Filled 2014-05-30 (×7): qty 1

## 2014-05-30 MED ORDER — BUDESONIDE-FORMOTEROL FUMARATE 160-4.5 MCG/ACT IN AERO
1.0000 | INHALATION_SPRAY | Freq: Two times a day (BID) | RESPIRATORY_TRACT | Status: DC
  Administered 2014-05-30 – 2014-06-04 (×9): 1 via RESPIRATORY_TRACT
  Filled 2014-05-30: qty 6

## 2014-05-30 MED ORDER — ALBUTEROL SULFATE (2.5 MG/3ML) 0.083% IN NEBU
INHALATION_SOLUTION | RESPIRATORY_TRACT | Status: AC
  Filled 2014-05-30: qty 3

## 2014-05-30 MED ORDER — ALBUTEROL SULFATE (2.5 MG/3ML) 0.083% IN NEBU
2.5000 mg | INHALATION_SOLUTION | Freq: Three times a day (TID) | RESPIRATORY_TRACT | Status: DC
  Administered 2014-05-30: 2.5 mg via RESPIRATORY_TRACT
  Filled 2014-05-30 (×2): qty 3

## 2014-05-30 MED ORDER — SILDENAFIL CITRATE 20 MG PO TABS
80.0000 mg | ORAL_TABLET | Freq: Three times a day (TID) | ORAL | Status: DC
  Administered 2014-05-30 – 2014-06-06 (×19): 80 mg via ORAL
  Filled 2014-05-30 (×23): qty 4

## 2014-05-30 MED ORDER — PANTOPRAZOLE SODIUM 40 MG PO TBEC
40.0000 mg | DELAYED_RELEASE_TABLET | Freq: Every day | ORAL | Status: DC
  Administered 2014-05-31 – 2014-06-04 (×5): 40 mg via ORAL
  Filled 2014-05-30 (×6): qty 1

## 2014-05-30 MED ORDER — VANCOMYCIN HCL IN DEXTROSE 1-5 GM/200ML-% IV SOLN
1000.0000 mg | Freq: Two times a day (BID) | INTRAVENOUS | Status: DC
  Administered 2014-05-30 – 2014-06-01 (×5): 1000 mg via INTRAVENOUS
  Filled 2014-05-30 (×6): qty 200

## 2014-05-30 MED ORDER — ACETAMINOPHEN 325 MG PO TABS
650.0000 mg | ORAL_TABLET | Freq: Four times a day (QID) | ORAL | Status: DC | PRN
  Administered 2014-05-30 – 2014-06-01 (×3): 650 mg via ORAL
  Filled 2014-05-30 (×3): qty 2

## 2014-05-30 MED ORDER — ONDANSETRON HCL 4 MG PO TABS
4.0000 mg | ORAL_TABLET | Freq: Four times a day (QID) | ORAL | Status: DC | PRN
  Administered 2014-06-03: 4 mg via ORAL
  Filled 2014-05-30: qty 1

## 2014-05-30 MED ORDER — ACETAMINOPHEN 325 MG PO TABS
650.0000 mg | ORAL_TABLET | Freq: Once | ORAL | Status: AC
  Administered 2014-05-30: 650 mg via ORAL
  Filled 2014-05-30: qty 2

## 2014-05-30 MED ORDER — ASPIRIN EC 81 MG PO TBEC
81.0000 mg | DELAYED_RELEASE_TABLET | Freq: Every day | ORAL | Status: DC
  Administered 2014-05-31 – 2014-06-04 (×5): 81 mg via ORAL
  Filled 2014-05-30 (×5): qty 1

## 2014-05-30 MED ORDER — ALBUTEROL SULFATE (2.5 MG/3ML) 0.083% IN NEBU
2.5000 mg | INHALATION_SOLUTION | RESPIRATORY_TRACT | Status: DC | PRN
Start: 2014-05-30 — End: 2014-06-05

## 2014-05-30 MED ORDER — TREPROSTINIL 0.6 MG/ML IN SOLN
18.0000 ug | Freq: Four times a day (QID) | RESPIRATORY_TRACT | Status: DC
  Administered 2014-05-30 (×2): 18 ug via RESPIRATORY_TRACT
  Filled 2014-05-30 (×37): qty 0.03

## 2014-05-30 MED ORDER — ONDANSETRON HCL 4 MG/2ML IJ SOLN
4.0000 mg | Freq: Once | INTRAMUSCULAR | Status: AC
  Administered 2014-05-30: 4 mg via INTRAVENOUS
  Filled 2014-05-30: qty 2

## 2014-05-30 MED ORDER — PAROXETINE HCL 20 MG PO TABS
20.0000 mg | ORAL_TABLET | Freq: Every day | ORAL | Status: DC
  Administered 2014-05-30 – 2014-06-04 (×6): 20 mg via ORAL
  Filled 2014-05-30 (×6): qty 1

## 2014-05-30 MED ORDER — SODIUM CHLORIDE 0.9 % IV SOLN
1000.0000 mL | INTRAVENOUS | Status: DC
  Administered 2014-05-30: 1000 mL via INTRAVENOUS

## 2014-05-30 MED ORDER — ARIPIPRAZOLE 2 MG PO TABS
2.0000 mg | ORAL_TABLET | ORAL | Status: DC
  Administered 2014-06-01 – 2014-06-03 (×2): 2 mg via ORAL
  Filled 2014-05-30 (×3): qty 1

## 2014-05-30 MED ORDER — FUROSEMIDE 40 MG PO TABS
60.0000 mg | ORAL_TABLET | Freq: Two times a day (BID) | ORAL | Status: DC
  Administered 2014-05-30: 60 mg via ORAL
  Filled 2014-05-30 (×4): qty 1

## 2014-05-30 MED ORDER — PREDNISONE 20 MG PO TABS
20.0000 mg | ORAL_TABLET | Freq: Every day | ORAL | Status: DC
  Administered 2014-05-30 – 2014-06-04 (×6): 20 mg via ORAL
  Filled 2014-05-30 (×6): qty 1

## 2014-05-30 MED ORDER — POTASSIUM CHLORIDE CRYS ER 20 MEQ PO TBCR
20.0000 meq | EXTENDED_RELEASE_TABLET | Freq: Two times a day (BID) | ORAL | Status: DC
Start: 2014-05-30 — End: 2014-05-30

## 2014-05-30 MED ORDER — ALBUTEROL SULFATE (2.5 MG/3ML) 0.083% IN NEBU
2.5000 mg | INHALATION_SOLUTION | RESPIRATORY_TRACT | Status: DC
  Administered 2014-05-30 – 2014-06-04 (×21): 2.5 mg via RESPIRATORY_TRACT
  Filled 2014-05-30 (×24): qty 3

## 2014-05-30 MED ORDER — POTASSIUM CHLORIDE CRYS ER 20 MEQ PO TBCR
20.0000 meq | EXTENDED_RELEASE_TABLET | Freq: Every day | ORAL | Status: DC
Start: 2014-05-31 — End: 2014-05-31
  Filled 2014-05-30: qty 1

## 2014-05-30 MED ORDER — HEPARIN SODIUM (PORCINE) 5000 UNIT/ML IJ SOLN: 5000.0000 [IU] | Freq: Three times a day (TID) | INTRAMUSCULAR | Status: DC

## 2014-05-30 MED ORDER — POTASSIUM CHLORIDE CRYS ER 20 MEQ PO TBCR
40.0000 meq | EXTENDED_RELEASE_TABLET | Freq: Every day | ORAL | Status: DC
  Administered 2014-05-31: 40 meq via ORAL
  Filled 2014-05-30: qty 2

## 2014-05-30 NOTE — H&P (Signed)
[facility] Admission History and Physical Service Pager: [phone]  Patient name: Rachel [name] Medical record number: [id number] Date of birth: 1961-01-29 Age: 54 y.o. Gender: female  Primary Care Provider: Christa See, MD Consultants: Pulmonology, Cardiology Code Status: Full (addressed this admission)  Chief Complaint: Shortness of breath with increase oxygen requirement  Assessment and Plan: Rachel [name] is a 53 y.o. female presenting with worsening shortness of breath, increase oxygen requirement, and acute resp illness with diarrhea.  PMH is significant for severe pHTN, pulmonary sarcoidosis, RV failure, HTN, Anxiety, cardiomyopathy, NSVT and Psychosis.  Worsening Shortness of breath with increase oxygen requirement/Sarcodosis: Admit to stepdown under attending Dr. McDiarmid. 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 She was referred to [facility] in the past for lung transplant evaluation. Pt admits to cough and increase in sputum production for at least a week. Pts office allergy list includes doxy and high dose steroids (psychosis). Chest X-ray today with Emphysema with extensive interstitial fibrotic change, stable. No frank edema or consolidation. Stable cardiomegaly. Lactic acid 1.21, WBC 11.5. BNP 533.5 (314- 10 days ago). - Pt currently on 100% non-re breather with saturations low 81% while in the room.  - Continue PAH medications: Revatio 80 mg TID/Tyvaso 18 mcg QID - Will keep steroids at 20 mg for now,  Psychosis with high dose steroids per chart, will defer to pulm for increase - will treat as HCAP with need of pseudomonal coverage for now, sputum cultures, gram stain, legionella, flu panel>> empiric cefepime/vanc  - albuterol PRN, did not appear to be wheezing on exam.  - Consulted Dr. Byrum/Pulmonology appreciate recommendations. May need to start Bipap if resp status declines further.   Cardiomyopathy:  last  Echo (9/15) with EF 55-60%, moderately dilated RV with mildly decreased systolic function, PA systolic pressure 54.  - Cycle trop, istat negative>>trop I (0.04) - EKG: ST, Atrial premature complexes, Borderline right axis deviation - EKG PRN for chest pain, and in am  Pulmonary hypertension with RV failure: last  Echo (9/15) with EF 55-60%, moderately dilated RV with mildly decreased systolic function, PA systolic pressure 54. Pt scheduled for cath 2/25 with Dr. Aundra Dubin - Continued lasix 60 mg BID for now, will defer to cards for increase with BNP of 533 today and LE edema.  - Continued Revatio, Tyvaso  NSVT with syncope: St Jude ICD was implanted. Given end stage lung disease and normalization of LV function, the device was not replaced at ERI and tachy therapies were turned off.   Anxiety/Depression: Paxil 20 mg daily. GERD: Continue PPI  FEN/GI: sips/chips meds, will hold on MIVF considering edema and increase in BNP Prophylaxis: Hep sq, PPI  Disposition: Pending pulm/cardio recs. Admit to stepdown for increasing oxygen needs.   History of Present Illness: Rachel [name] is a 54 y.o. female presenting with worsening shortness of breath since Friday (4 days ago). She is on 6L 02 at home with oxygen saturations in the mid 80's. Since Friday her oxygen saturations was high 60- 70's, pt was increasingly short of breath with activity. She states overall she is not feeling well, and reports a subjective fever at home. She endorses nausea, vomit,  aches, weakness, cough and  increase sputum production today. She also endorses non-bloody diarrhea and stomach cramps. Patient has a significant history of severe pHTN, pulmonary sarcoidosis, RV failure, HTN, Anxiety, cardiomyopathy, NSVT and Psychosis.  Pt is scheduled for a cardiac cath in 3  days with Dr. Marigene Ehlers. She states the last visit they told her to take lasix three times a day, this was 2 weeks ago. She is now on Revatio 80 mg TID and  inhaled lloprost.RHC (8/12): mean RA 2, PA 51/24, mean PA 35, mean PCWP 6, CI 3, PVR 5.1 WU.  She wears O2 at 6L/min at all times usually at home, she remains on flovent.   Last visit with pulmonology was 04/21/2014 with Dr. Lamonte Sakai, at that time she was told to increase her oxygen flow, which it does not appear she wishes to do so secondary to irritation. She is taking sildenafil and Tyvaso for PAH. She has pulmonary sarcoidosis as well and is taking prednisone 20 mg daily, it appears she may have been doing better on 30 mg, however she did not want the increase 2/2 side effects. She was referred to [facility] in the past lung transplant evaluation, it appears she needed to come up with money are her own to cover and was unable to do so.   Review Of Systems: Per HPI with the following additions: per HPI Otherwise 12 point review of systems was performed and was unremarkable.  Patient Active Problem List   Diagnosis Date Noted  . Thrush 04/21/2014  . Congestive heart disease   . SOB (shortness of breath)   . Palliative care encounter   . Appetite loss   . Acute on chronic diastolic congestive heart failure 02/04/2014  . Dyspnea on exertion 02/02/2014  . Allergic rhinitis 01/07/2014  . HCAP (healthcare-associated pneumonia) 12/20/2013  . Acute on chronic respiratory failure with hypoxia 11/27/2013  . Hypoxia 11/25/2013  . Dyspnea 11/10/2013  . Spinal stenosis of lumbar region 10/27/2013  . Abnormal involuntary movements 10/26/2013  . Shortness of breath 10/24/2013  . Major depressive disorder with psychotic features 08/31/2013  . Loss of weight 08/12/2013  . SVT (supraventricular tachycardia) 08/05/2013  . Congestive heart failure 09/21/2008  . Secondary pulmonary hypertension 09/05/2008  . PULMONARY SARCOIDOSIS 05/03/2008  . ANXIETY 06/05/2006  . HYPERTENSION, BENIGN SYSTEMIC 06/05/2006  . CARDIOMYOPATHY, IDIOPATHIC 06/05/2006  . GASTROESOPHAGEAL REFLUX, NO ESOPHAGITIS 06/05/2006   Past  Medical History: Past Medical History  Diagnosis Date  . GERD (gastroesophageal reflux disease)   . Hypertension   . 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 CHF (congestive heart failure)     Right side- Dr. Aundra Dubin ([place])  . Pulmonary HTN   . Anxiety   . Cardiomyopathy   . NSVT (nonsustained ventricular tachycardia)     with syncope: St Jude ICD was implanted. Device now nearing ERI. Given end stage lung disease and normalization of LV function, the device will not be replaced and tachy therapies were turned off  . Chronic chest pain     LHC (08/2008) with no angiographic coronary diease  . Allergic rhinitis   . H/O: iron deficiency anemia   . Secondary amenorrhea     irregular menses  . Psychosis     with high dose steroids  . SVT (supraventricular tachycardia)     possible runs  . Hypokalemia     recurrent  . Rotator cuff sprain   . Chronic back pain   . COPD (chronic obstructive pulmonary disease)   . Shortness of breath    Past Surgical History: Past Surgical History  Procedure Laterality Date  . Cardiac catheterization  08/26/2008    5% EF with normal coronary arteries and normal wall motion  . Adenosine cardiolyte  03/31/2003  no ischemia/infarct; marked global LV hypekinesis; resting EF 22%  . Cardiac catheterization  03/31/2003    globally depressed LV fxn with EF 20%; idiopathic dilated cardiomyopathy  . Defibrillator placed  03/31/2003    St. Jude single chamber (Dr. Cristopher Peru)  . Pfts      FVC 2000 (56%) FEV1 1400 (47%), ratio 0.7; insignif response to bronchodilatior; stable from 1/05 - 9/05; PFTs: Mod restriction, FVC 1.9L, FEV1 1.6L, both 53% predicted; slt response to brochodilators 04/08/2002   Social History: History  Substance Use Topics  . Smoking status: Former Smoker -- 1.50 packs/day for 11 years    Types: Cigarettes    Quit date: 04/08/1992  . Smokeless tobacco: Never Used     Comment: 1ppd x 20 years  . Alcohol Use: No     Comment:  remote history of alcohol abuse (quit 1994)   Additional social history: None Please also refer to relevant sections of EMR.  Family History: Family History  Problem Relation Age of Onset  . Lung cancer Father   . Cancer Father     lung cancer  . Hypertension    . Diabetes Mother     border line  . Hypertension Mother   . Heart failure Mother    Allergies and Medications: Allergies  Allergen Reactions  . Latex Rash  . Nitroglycerin Other (See Comments)    Patient is on Revatio  . Other Other (See Comments)    High Dose Steroids cause chemical imbalance and altered mental status  . Meperidine Hcl Other (See Comments)    REACTION: Makes her feel funny  . Tramadol Hcl Other (See Comments)    REACTION: nausea, lightheadedness, sleepiness, and dizziness  . Ace Inhibitors Other (See Comments)    unknown  . Morphine Other (See Comments)    Loopy, light headed feeling    . Propoxyphene N-Acetaminophen Rash   Allergies >>> 1) Steroids High Dose  2) Demerol  3) Ultram  4) Darvocet-N 100  5) Doxycycline   No current facility-administered medications on file prior to encounter.   Current Outpatient Prescriptions on File Prior to Encounter  Medication Sig Dispense Refill  . albuterol (PROAIR HFA) 108 (90 BASE) MCG/ACT inhaler Inhale 2 puffs into the lungs 4 (four) times daily. 18 g 11  . ARIPiprazole (ABILIFY) 2 MG tablet TAKE ONE TABLET BY MOUTH EVERY OTHER DAY 30 tablet 3  . aspirin EC 81 MG tablet Take 1 tablet (81 mg total) by mouth daily. 1 tablet 0  . benzonatate (TESSALON PERLES) 100 MG capsule Take 1 capsule (100 mg total) by mouth 2 (two) times daily as needed for cough. 30 capsule 0  . budesonide-formoterol (SYMBICORT) 160-4.5 MCG/ACT inhaler Inhale 1 puff into the lungs 2 (two) times daily. 1 Inhaler 3  . esomeprazole (NEXIUM) 40 MG capsule Take 1 capsule (40 mg total) by mouth every morning. 30 capsule 3  . furosemide (LASIX) 20 MG tablet Take 3 tablets (60 mg  total) by mouth 2 (two) times daily. 90 tablet 3  . PARoxetine (PAXIL) 20 MG tablet TAKE ONE TABLET BY MOUTH ONCE DAILY 30 tablet 3  . potassium chloride SA (K-DUR,KLOR-CON) 20 MEQ tablet Take 1-2 tablets (20-40 mEq total) by mouth 2 (two) times daily. 40 meq in the morning and 20 meq at night 90 tablet 3  . predniSONE (DELTASONE) 10 MG tablet Take 2 tablets (20 mg total) by mouth daily. 60 tablet 1  . sildenafil (REVATIO) 20 MG tablet Take 4 tablets (  80 mg total) by mouth 3 (three) times daily. take 4  tablets every 8 hours 10 tablet 0  . Treprostinil (TYVASO) 0.6 MG/ML SOLN Inhale 18 mcg into the lungs 4 (four) times daily. 3.6 mL 11  . nystatin (MYCOSTATIN) 100000 UNIT/ML suspension Use as directed 5 mLs (500,000 Units total) in the mouth or throat 3 (three) times daily. For 5 days (Patient not taking: Reported on 05/30/2014) 75 mL 0    Objective: BP 115/76 mmHg  Pulse 118  Temp(Src) 99.4 F (37.4 C) (Rectal)  Resp 14  Wt 169 lb 8 oz (76.885 kg)  SpO2 85% Exam: General: Resp distress. Requiring 100% non-rerbeather, with oxygen saturations in the low to mid 80s. Sitting up in bed. She is oriented x3. Alert.  HEENT: AT. [place]. No conjunctivitis or icterus. MMM.  Neck: Supple. Normal ROM. Cardiovascular: Tachycardic. 1/6 SM LLSB.  Respiratory: Coughing present. Nonproductive. Fair air movement.  Abdomen: Soft. NTND. Mildly obese. BS present.  Extremities: No erythema, 2+ edema, non-tender.  Skin: Warm, dry, intact Neuro: Alert, oriented. PERLA. EOMI. Coordination normal.  Psych: appears normal mood, affect and speech.   Labs and Imaging: CBC BMET   Recent Labs Lab 05/30/14 0950  WBC 11.5*  HGB 10.3*  HCT 34.9*  PLT 348    Recent Labs Lab 05/30/14 0950  NA 143  K 4.5  CL 101  CO2 35*  BUN 15  CREATININE 1.04  GLUCOSE 105*  CALCIUM 9.1     Dg Chest 2 View  05/30/2014   CLINICAL DATA:  Difficulty breathing. History of COPD and hypertension  EXAM: CHEST  2 VIEW   COMPARISON:  February 03, 2014  FINDINGS: There is underlying emphysema. There is extensive interstitial fibrosis, particularly in the upper lobes. There is no frank edema or consolidation. Heart is enlarged with pulmonary vascularity within normal limits. Pacemaker lead is attached to the right ventricle. There is no demonstrable adenopathy. No bone lesions.  IMPRESSION: Emphysema with extensive interstitial fibrotic change, stable. No frank edema or consolidation. Stable cardiomegaly.   Electronically Signed   By: Lowella Grip III M.D.   On: 05/30/2014 12:05     Ma Hillock, DO 05/30/2014, 2:12 PM PGY-3, [place] pager: [phone], text pages welcome

## 2014-05-30 NOTE — Progress Notes (Signed)
Pt states "she does not want BIPAP at this time".   SPO2 93% on pt RBR.  Pt not SOB and is able to complete full sentences without SOB.  RT to monitor

## 2014-05-30 NOTE — Consult Note (Signed)
PULMONARY / CRITICAL CARE MEDICINE   Name: Rachel [name] MRN: [id number] DOB: 12-28-1960    ADMISSION DATE:  05/30/2014 CONSULTATION DATE:  05/30/2014  REFERRING MD :  EDP  CHIEF COMPLAINT:  Productive cough  INITIAL PRESENTATION: 54 year old female with severe sarcoidosis and PAH presented to [facility] ED 2/22 c/o productive cough, vomiting, and weakness for several weeks. In ED she was hypoxemic requiring 100% NRB. PCCM called for admission.  STUDIES:  Echo 9/15 >EF 55-60%, moderately dilated RV with mildly decreased systolic function, PA systolic pressure 54.   SIGNIFICANT EVENTS:   HISTORY OF PRESENT ILLNESS:  54 year old female with PMH as below, which is significant for stage IV pulmonary sarcoidosis, mod-severe secondary PAH, NSVT (ICD in place), and psychosis.  She is an [facility] patient,  With chronic hypoxemia on 6L O2 at home, was a potential candidate for heart and lung transplant in the past, but was denied at [facility] in 2010. She takes Revatio and Tyvasto at home. She is scheduled for [place] 2/25 under Dr. Aundra Dubin who is her primary cardiologist. She was seen by him in the office 2/12 at which time her lasix was increased due to progressive LE edema. Plan was to pursue transplant workup again.  Most recent pulmonary appointment 1/14. It was recommended that she increase her O2 and prednisone, however, she was opposed to these changes - the O2 bothers her nose, high dose pred gives her severe side effects.  She presented to [facility] ED 2/22 c/o weakness, malaise, diarrhea that started 5 days PTA.  She has also had productive cough for 2 weeks. She was noted to be hypoxemic and was placed on 100% NRB en route to ED. In ED she was found to have stable chronic findings on CXR with mild leukocytosis and elevated BNP. She was hypoxic, requiring NRB to maintain sats in low 80s (85-90% is her baseline). Her home steroids were continued and she was started on empiric HCAP coverage. She was admitted to FMTS. PCCM  consulted for further eval.   PAST MEDICAL HISTORY :   has a past medical history of GERD (gastroesophageal reflux disease); Hypertension; Sarcoidosis; CHF (congestive heart failure); Pulmonary HTN; Anxiety; Cardiomyopathy; NSVT (nonsustained ventricular tachycardia); Chronic chest pain; Allergic rhinitis; H/O: iron deficiency anemia; Secondary amenorrhea; Psychosis; SVT (supraventricular tachycardia); Hypokalemia; Rotator cuff sprain; Chronic back pain; COPD (chronic obstructive pulmonary disease); and Shortness of breath.  has past surgical history that includes Cardiac catheterization (08/26/2008); adenosine cardiolyte (03/31/2003); Cardiac catheterization (03/31/2003); defibrillator placed (03/31/2003); and PFTs. Prior to Admission medications   Medication Sig Start Date End Date Taking? Authorizing Provider  albuterol (PROAIR HFA) 108 (90 BASE) MCG/ACT inhaler Inhale 2 puffs into the lungs 4 (four) times daily. 12/07/13  Yes Sharon Mt Street, MD  ARIPiprazole (ABILIFY) 2 MG tablet TAKE ONE TABLET BY MOUTH EVERY OTHER DAY 03/25/14  Yes Sharon Mt Street, MD  aspirin EC 81 MG tablet Take 1 tablet (81 mg total) by mouth daily. 08/21/13  Yes Waylan Boga, NP  benzonatate (TESSALON PERLES) 100 MG capsule Take 1 capsule (100 mg total) by mouth 2 (two) times daily as needed for cough. 01/17/14  Yes Sharon Mt Street, MD  budesonide-formoterol [facility]) 160-4.5 MCG/ACT inhaler Inhale 1 puff into the lungs 2 (two) times daily. 11/03/13  Yes Sharon Mt Street, MD  esomeprazole (NEXIUM) 40 MG capsule Take 1 capsule (40 mg total) by mouth every morning. 01/17/14  Yes Sharon Mt Street, MD  furosemide (LASIX) 20 MG tablet Take  3 tablets (60 mg total) by mouth 2 (two) times daily. 05/20/14  Yes Larey Dresser, MD  PARoxetine (PAXIL) 20 MG tablet TAKE ONE TABLET BY MOUTH ONCE DAILY 02/17/14  Yes Sharon Mt Street, MD  potassium chloride SA (K-DUR,KLOR-CON) 20 MEQ tablet Take 1-2 tablets (20-40  mEq total) by mouth 2 (two) times daily. 40 meq in the morning and 20 meq at night 01/10/14  Yes Sharon Mt Street, MD  predniSONE (DELTASONE) 10 MG tablet Take 2 tablets (20 mg total) by mouth daily. 04/22/14  Yes Collene Gobble, MD  sildenafil (REVATIO) 20 MG tablet Take 4 tablets (80 mg total) by mouth 3 (three) times daily. take 4  tablets every 8 hours 08/21/13  Yes Waylan Boga, NP  Treprostinil (TYVASO) 0.6 MG/ML SOLN Inhale 18 mcg into the lungs 4 (four) times daily. 10/18/13  Yes Larey Dresser, MD  nystatin (MYCOSTATIN) 100000 UNIT/ML suspension Use as directed 5 mLs (500,000 Units total) in the mouth or throat 3 (three) times daily. For 5 days Patient not taking: Reported on 05/30/2014 04/21/14   Collene Gobble, MD   Allergies  Allergen Reactions  . Latex Rash  . Nitroglycerin Other (See Comments)    Patient is on Revatio  . Other Other (See Comments)    High Dose Steroids cause chemical imbalance and altered mental status  . Meperidine Hcl Other (See Comments)    REACTION: Makes her feel funny  . Tramadol Hcl Other (See Comments)    REACTION: nausea, lightheadedness, sleepiness, and dizziness  . Ace Inhibitors Other (See Comments)    unknown  . Morphine Other (See Comments)    Loopy, light headed feeling    . Propoxyphene N-Acetaminophen Rash    FAMILY HISTORY:  indicated that her father is deceased.  SOCIAL HISTORY:  reports that she quit smoking about 22 years ago. Her smoking use included Cigarettes. She has a 16.5 pack-year smoking history. She has never used smokeless tobacco. She reports that she does not drink alcohol or use illicit drugs.  REVIEW OF SYSTEMS:  Diarrhea, weakness, nausea, poor PO intake, some slight increase in cough and mucous. Her oxygen saturations have been at her usual (low) baseline per her report.   SUBJECTIVE:  Feels very weak and nauseated  VITAL SIGNS: Temp:  [98.4 F (36.9 C)-99.4 F (37.4 C)] 99.4 F (37.4 C) (02/22 1047) Pulse  Rate:  [99-118] 112 (02/22 1445) Resp:  [14-24] 22 (02/22 1445) BP: (101-120)/(48-80) 120/77 mmHg (02/22 1445) SpO2:  [85 %-100 %] 97 % (02/22 1445) Weight:  [76.885 kg (169 lb 8 oz)] 76.885 kg (169 lb 8 oz) (02/22 1032) HEMODYNAMICS:   VENTILATOR SETTINGS:   INTAKE / OUTPUT: No intake or output data in the 24 hours ending 05/30/14 1533  PHYSICAL EXAMINATION: General:  Weak, but comfortable Neuro:  Awake, oriented, non-focal HEENT:  OP dry, PERRL, visible JVP 6+ cm Cardiovascular:  Regular, loud s2 Lungs:  Distant, B insp crackles Abdomen:  Soft, benign Musculoskeletal:  Trace pre-tibial edema Skin:  No rash  LABS:  CBC  Recent Labs Lab 05/30/14 0950  WBC 11.5*  HGB 10.3*  HCT 34.9*  PLT 348   Coag's No results for input(s): APTT, INR in the last 168 hours. BMET  Recent Labs Lab 05/30/14 0950  NA 143  K 4.5  CL 101  CO2 35*  BUN 15  CREATININE 1.04  GLUCOSE 105*   Electrolytes  Recent Labs Lab 05/30/14 0950  CALCIUM 9.1   Sepsis  Markers  Recent Labs Lab 05/30/14 1020 05/30/14 1443  LATICACIDVEN 1.52 1.21   ABG No results for input(s): PHART, PCO2ART, PO2ART in the last 168 hours. Liver Enzymes  Recent Labs Lab 05/30/14 0950  AST 25  ALT 28  ALKPHOS 68  BILITOT 0.8  ALBUMIN 3.3*   Cardiac Enzymes  Recent Labs Lab 05/30/14 1014  TROPONINI 0.04*   Glucose No results for input(s): GLUCAP in the last 168 hours.  Imaging No results found.   ASSESSMENT / PLAN:  Acute on chronic respiratory failure in setting acute infection superimposed on PAH / sarcoidosis  Sarcoidosis Stage IV with severe parenchymal scar and infiltrate Severe secondary PAH Chronic hypoxemia Apparent acute viral syndrome with diarrhea, URI sx Possible PNA  Recs:  - agree with empiric abx - continue Revatio and Tyvaso; she will likely need arrangements to use her home Tyvaso. She has felt that these medications have benefited her over the years, although  overall data would support an ERB over these meds.  - would temporarily DECREASE her diuresis, she appears tro be dry in setting viral syndrome.  - will need to discuss overall plans with Dr Aundra Dubin, particularly regarding whether she will be able to face the rigors of transplant. She would likely need Heart + Lung Tx.  - will follow with you   Baltazar Apo, MD, PhD 05/30/2014, 4:38 PM [facility] [phone] or if no answer [phone]

## 2014-05-30 NOTE — ED Notes (Signed)
Pt placed on venturi mask at 55%. Pts SpO2 86%. Pt reports that is her baseline SpO2, Dr. Eulis Foster notified. He said to continue to monitor and leave pt on the venturi Mask.

## 2014-05-30 NOTE — Progress Notes (Signed)
Pt is refusing ABG.

## 2014-05-30 NOTE — ED Notes (Signed)
MD at bedside.

## 2014-05-30 NOTE — ED Notes (Signed)
Placed pt on 6L O2, pt did not tolerate, O2 sats dropped in the 70s, placed back on NRB.  Pt O2 now 100%.

## 2014-05-30 NOTE — ED Provider Notes (Signed)
CSN: [id number]     Arrival date & time 05/30/14  0930 History   First MD Initiated Contact with Patient 05/30/14 [phone]     Chief Complaint  Patient presents with  . Nausea  . Emesis     (Consider location/radiation/quality/duration/timing/severity/associated sxs/prior Treatment) HPI   Rachel [name] is a 54 y.o. female who presents for evaluation of nausea, vomiting, achiness, cough with sputum production and weakness for several days.  She felt worse after taking her medications this morning, so decided to come here by EMS for evaluation.  She uses oxygen at 6 [name] per nasal cannula at home.  During transport, she required a Smith mask oxygen to maintain her oxygen saturations above 90%.  She has not had any fever or chills.  She denies chest pain or back pain.  No recent illnesses.  Her doctor increased her Lasix 2 weeks ago.  There are no other known modifying factors.   Past Medical History  Diagnosis Date  . GERD (gastroesophageal reflux disease)   . Hypertension   . 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 CHF (congestive heart failure)     Right side- Dr. Aundra Dubin ([place])  . Pulmonary HTN   . Anxiety   . Cardiomyopathy   . NSVT (nonsustained ventricular tachycardia)     with syncope: St Jude ICD was implanted. Device now nearing ERI. Given end stage lung disease and normalization of LV function, the device will not be replaced and tachy therapies were turned off  . Chronic chest pain     LHC (08/2008) with no angiographic coronary diease  . Allergic rhinitis   . H/O: iron deficiency anemia   . Secondary amenorrhea     irregular menses  . Psychosis     with high dose steroids  . SVT (supraventricular tachycardia)     possible runs  . Hypokalemia     recurrent  . Rotator cuff sprain   . Chronic back pain   . COPD (chronic obstructive pulmonary disease)   . Shortness of breath    Past Surgical History  Procedure Laterality Date  . Cardiac catheterization  08/26/2008    5%  EF with normal coronary arteries and normal wall motion  . Adenosine cardiolyte  03/31/2003    no ischemia/infarct; marked global LV hypekinesis; resting EF 22%  . Cardiac catheterization  03/31/2003    globally depressed LV fxn with EF 20%; idiopathic dilated cardiomyopathy  . Defibrillator placed  03/31/2003    St. Jude single chamber (Dr. Cristopher Peru)  . Pfts      FVC 2000 (56%) FEV1 1400 (47%), ratio 0.7; insignif response to bronchodilatior; stable from 1/05 - 9/05; PFTs: Mod restriction, FVC 1.9L, FEV1 1.6L, both 53% predicted; slt response to brochodilators 04/08/2002   Family History  Problem Relation Age of Onset  . Lung cancer Father   . Cancer Father     lung cancer  . Hypertension    . Diabetes Mother     border line  . Hypertension Mother   . Heart failure Mother    History  Substance Use Topics  . Smoking status: Former Smoker -- 1.50 packs/day for 11 years    Types: Cigarettes    Quit date: 04/08/1992  . Smokeless tobacco: Never Used     Comment: 1ppd x 20 years  . Alcohol Use: No     Comment: remote history of alcohol abuse (quit 1994)   OB History    No  data available     Review of Systems  All other systems reviewed and are negative.     Allergies  Latex; Nitroglycerin; Other; Meperidine hcl; Tramadol hcl; Ace inhibitors; Morphine; and Propoxyphene n-acetaminophen  Home Medications   Prior to Admission medications   Medication Sig Start Date End Date Taking? Authorizing Provider  albuterol (PROAIR HFA) 108 (90 BASE) MCG/ACT inhaler Inhale 2 puffs into the lungs 4 (four) times daily. 12/07/13  Yes Sharon Mt Street, MD  ARIPiprazole (ABILIFY) 2 MG tablet TAKE ONE TABLET BY MOUTH EVERY OTHER DAY 03/25/14  Yes Sharon Mt Street, MD  aspirin EC 81 MG tablet Take 1 tablet (81 mg total) by mouth daily. 08/21/13  Yes Waylan Boga, NP  benzonatate (TESSALON PERLES) 100 MG capsule Take 1 capsule (100 mg total) by mouth 2 (two) times daily as needed for  cough. 01/17/14  Yes Sharon Mt Street, MD  budesonide-formoterol [facility]) 160-4.5 MCG/ACT inhaler Inhale 1 puff into the lungs 2 (two) times daily. 11/03/13  Yes Sharon Mt Street, MD  esomeprazole (NEXIUM) 40 MG capsule Take 1 capsule (40 mg total) by mouth every morning. 01/17/14  Yes Sharon Mt Street, MD  furosemide (LASIX) 20 MG tablet Take 3 tablets (60 mg total) by mouth 2 (two) times daily. 05/20/14  Yes Larey Dresser, MD  PARoxetine (PAXIL) 20 MG tablet TAKE ONE TABLET BY MOUTH ONCE DAILY 02/17/14  Yes Sharon Mt Street, MD  potassium chloride SA (K-DUR,KLOR-CON) 20 MEQ tablet Take 1-2 tablets (20-40 mEq total) by mouth 2 (two) times daily. 40 meq in the morning and 20 meq at night 01/10/14  Yes Sharon Mt Street, MD  predniSONE (DELTASONE) 10 MG tablet Take 2 tablets (20 mg total) by mouth daily. 04/22/14  Yes Collene Gobble, MD  sildenafil (REVATIO) 20 MG tablet Take 4 tablets (80 mg total) by mouth 3 (three) times daily. take 4  tablets every 8 hours 08/21/13  Yes Waylan Boga, NP  Treprostinil (TYVASO) 0.6 MG/ML SOLN Inhale 18 mcg into the lungs 4 (four) times daily. 10/18/13  Yes Larey Dresser, MD  nystatin (MYCOSTATIN) 100000 UNIT/ML suspension Use as directed 5 mLs (500,000 Units total) in the mouth or throat 3 (three) times daily. For 5 days Patient not taking: Reported on 05/30/2014 04/21/14   Collene Gobble, MD   BP 104/67 mmHg  Pulse 109  Temp(Src) 99.4 F (37.4 C) (Rectal)  Resp 21  Wt 169 lb 8 oz (76.885 kg)  SpO2 100% Physical Exam  Constitutional: She is oriented to person, place, and time. She appears well-developed and well-nourished.  HENT:  Head: Normocephalic and atraumatic.  Right Ear: External ear normal.  Left Ear: External ear normal.  Eyes: Conjunctivae and EOM are normal. Pupils are equal, round, and reactive to light.  Neck: Normal range of motion and phonation normal. Neck supple.  Cardiovascular: Normal rate, regular rhythm and normal  heart sounds.   Pulmonary/Chest: Effort normal and breath sounds normal. She has no wheezes. She has no rales. She exhibits no bony tenderness.  Intermittent coughing, nonproductive.  Reclining in some of our positions, in no apparent respiratory distress.  Fair air movement, bilaterally.  Abdominal: Soft. There is no tenderness.  Musculoskeletal: Normal range of motion. She exhibits no tenderness.  2+ peripheral edema  Neurological: She is alert and oriented to person, place, and time. No cranial nerve deficit or sensory deficit. She exhibits normal muscle tone. Coordination normal.  Skin: Skin is warm, dry and intact.  Psychiatric:  She has a normal mood and affect. Her behavior is normal. Judgment and thought content normal.  Nursing note and vitals reviewed.   ED Course  Procedures (including critical care time) Medications  0.9 %  sodium chloride infusion (1,000 mLs Intravenous New Bag/Given 05/30/14 1042)  ondansetron (ZOFRAN) injection 4 mg (4 mg Intravenous Given 05/30/14 0954)  acetaminophen (TYLENOL) tablet 650 mg (650 mg Oral Given 05/30/14 1123)    Patient Vitals for the past 24 hrs:  BP Temp Temp src Pulse Resp SpO2 Weight  05/30/14 1230 104/67 mmHg - - 109 21 100 % -  05/30/14 1200 111/76 mmHg - - 107 21 97 % -  05/30/14 1130 101/75 mmHg - - 117 20 92 % -  05/30/14 1047 - 99.4 F (37.4 C) Rectal - - - -  05/30/14 1045 111/80 mmHg - - 113 - 99 % -  05/30/14 1032 - - - - - - 169 lb 8 oz (76.885 kg)  05/30/14 1015 105/76 mmHg - - 99 24 98 % -  05/30/14 0946 - - - - - 98 % -  05/30/14 0941 - - - - - 98 % -  05/30/14 0940 (!) 105/48 mmHg 98.4 F (36.9 C) Oral 105 19 95 % -  05/30/14 0934 - - - - - 97 % -    1:16 PM Reevaluation with update and discussion. After initial assessment and treatment, an updated evaluation reveals testing has returned and is essentially at baseline.  Oxygenation status has yet to be determined.  Will attempt to wean to Ventimask, then nasal  cannula.  The patient remains clinically unchanged. Rachel [name]   1:34 PM-Consult complete with PCP. Patient case explained and discussed. She agrees to admit patient for further evaluation and treatment. Call ended at 13:45  CRITICAL CARE Performed by: Richarda Blade Total critical care time: 35 minutes Critical care time was exclusive of separately billable procedures and treating other patients. Critical care was necessary to treat or prevent imminent or life-threatening deterioration. Critical care was time spent personally by me on the following activities: development of treatment plan with patient and/or surrogate as well as nursing, discussions with consultants, evaluation of patient's response to treatment, examination of patient, obtaining history from patient or surrogate, ordering and performing treatments and interventions, ordering and review of laboratory studies, ordering and review of radiographic studies, pulse oximetry and re-evaluation of patient's condition.   Labs Review Labs Reviewed  CBC - Abnormal; Notable for the following:    WBC 11.5 (*)    Hemoglobin 10.3 (*)    HCT 34.9 (*)    MCH 25.8 (*)    MCHC 29.5 (*)    RDW 19.2 (*)    All other components within normal limits  BRAIN NATRIURETIC PEPTIDE - Abnormal; Notable for the following:    B Natriuretic Peptide 533.5 (*)    All other components within normal limits  COMPREHENSIVE METABOLIC PANEL - Abnormal; Notable for the following:    CO2 35 (*)    Glucose, Bld 105 (*)    Albumin 3.3 (*)    GFR calc non Af Amer 60 (*)    GFR calc Af Amer 70 (*)    All other components within normal limits  URINALYSIS, ROUTINE W REFLEX MICROSCOPIC - Abnormal; Notable for the following:    Color, Urine AMBER (*)    Protein, ur 30 (*)    Urobilinogen, UA 2.0 (*)    All other components within normal limits  TROPONIN I -  Abnormal; Notable for the following:    Troponin I 0.04 (*)    All other components within normal  limits  CULTURE, BLOOD (ROUTINE X 2)  CULTURE, BLOOD (ROUTINE X 2)  URINE CULTURE  LIPASE, BLOOD  URINE MICROSCOPIC-ADD ON  I-STAT TROPOININ, ED  I-STAT CG4 LACTIC ACID, ED  I-STAT CG4 LACTIC ACID, ED    Imaging Review Dg Chest 2 View  05/30/2014   CLINICAL DATA:  Difficulty breathing. History of COPD and hypertension  EXAM: CHEST  2 VIEW  COMPARISON:  February 03, 2014  FINDINGS: There is underlying emphysema. There is extensive interstitial fibrosis, particularly in the upper lobes. There is no frank edema or consolidation. Heart is enlarged with pulmonary vascularity within normal limits. Pacemaker lead is attached to the right ventricle. There is no demonstrable adenopathy. No bone lesions.  IMPRESSION: Emphysema with extensive interstitial fibrotic change, stable. No frank edema or consolidation. Stable cardiomegaly.   Electronically Signed   By: Lowella Grip III M.D.   On: 05/30/2014 12:05     EKG Interpretation   Date/Time:  Monday May 30 2014 09:38:20 EST Ventricular Rate:  106 PR Interval:  184 QRS Duration: 86 QT Interval:  348 QTC Calculation: 462 R Axis:   94 Text Interpretation:  Sinus tachycardia Atrial premature complexes  Borderline right axis deviation since last tracing no significant change  Confirmed by Rennee Coyne  MD, Jayonna Meyering ([id number]) on 05/30/2014 10:12:02 AM      MDM   Final diagnoses:  SOB (shortness of breath)  Hypoxia    Cough with shortness of breath.  She has chronic hypoxia despite using nasal cannula oxygen.  Her typical oxygen saturations are 86%, while at home.  She recently saw her cardiologist, and is scheduled to have a right heart catheterization in 3 days time.  Her cardiologist is considering referral, for lung transplant.  She has not seen her pulmonologist recently.  She has previously had consideration for combined heart-lung transplant, but decided to not proceed in that direction.  Currently, today she appears to be roughly at her  baseline.  She is requiring somewhat more oxygenation, now with facemask.  Nursing Notes Reviewed/ Care Coordinated, and agree without changes. Applicable Imaging Reviewed.  Interpretation of Laboratory Data incorporated into ED treatment   Plan: Admit   Richarda Blade, MD 05/30/14 [phone]

## 2014-05-30 NOTE — ED Notes (Signed)
Attempted report 

## 2014-05-30 NOTE — ED Notes (Signed)
Patient transported to X-ray 

## 2014-05-30 NOTE — Consult Note (Signed)
Patient ID: Rachel [name] MRN: [id number], DOB/AGE: November 27, 1960   Admit date: 05/30/2014   Reason for Consult: CHF Referring MD: ED MD  Primary Physician: Maryjean Ka, MD Primary Cardiologist: Dr. Shirlee Latch  Pt. 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   Problem List  Past Medical History  Diagnosis Date  . GERD (gastroesophageal reflux disease)   . Hypertension   . 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 Maple Hudson  . CHF (congestive heart failure)     Right side- Dr. Shirlee Latch ([facility])  . Pulmonary HTN   . Anxiety   . Cardiomyopathy   . NSVT (nonsustained ventricular tachycardia)     with syncope: St Jude ICD was implanted. Device now nearing ERI. Given end stage lung disease and normalization of LV function, the device will not be replaced and tachy therapies were turned off  . Chronic chest pain     LHC (08/2008) with no angiographic coronary diease  . Allergic rhinitis   . H/O: iron deficiency anemia   . Secondary amenorrhea     irregular menses  . Psychosis     with high dose steroids  . SVT (supraventricular tachycardia)     possible runs  . Hypokalemia     recurrent  . Rotator cuff sprain   . Chronic back pain   . COPD (chronic obstructive pulmonary disease)   . Shortness of breath     Past Surgical History  Procedure Laterality Date  . Cardiac catheterization  08/26/2008    5% EF with normal coronary arteries and normal wall motion  . Adenosine cardiolyte  03/31/2003    no ischemia/infarct; marked global LV hypekinesis; resting EF 22%  . Cardiac catheterization  03/31/2003    globally depressed LV fxn with EF 20%; idiopathic dilated cardiomyopathy  . Defibrillator placed  03/31/2003    St. Jude single chamber (Dr. Lewayne Bunting)  . Pfts      FVC 2000 (56%) FEV1 1400 (47%), ratio 0.7; insignif response to bronchodilatior; stable from  1/05 - 9/05; PFTs: Mod restriction, FVC 1.9L, FEV1 1.6L, both 53% predicted; slt response to brochodilators 04/08/2002     Allergies  Allergies  Allergen Reactions  . Latex Rash  . Nitroglycerin Other (See Comments)    Patient is on Revatio  . Other Other (See Comments)    High Dose Steroids cause chemical imbalance and altered mental status  . Meperidine Hcl Other (See Comments)    REACTION: Makes her feel funny  . Tramadol Hcl Other (See Comments)    REACTION: nausea, lightheadedness, sleepiness, and dizziness  . Ace Inhibitors Other (See Comments)    unknown  . Morphine Other (See Comments)    Loopy, light headed feeling    . Propoxyphene N-Acetaminophen Rash    HPI  The patient is a 54 y/o AA female, followed by Dr. 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 AA female, followed by Dr. 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 Cardiac MRI in 2004 showed no evidence for infiltrative disease/cardiac involement. Last 2D echo was 12/2013 which demonstrated normal LVF with an EF of 55-60%. Wall motion was normal w/o WMA. The right ventricle size was moderately dilated with mildly reduced systolic function. She has been evaluated for heart/lung transplant at [facility] as an inpatient but needed to raise around $10,000 to continue evaluation and decided against it. Dr. Delton Coombes  follows her sarcoid.  Per records, her O2 stats are chronically in the upper 80s- low 90s. She also has a h/o ICD implantation for NSVT w/ syncope. However, given end stage lung disease and normalization of LV function, the device was not replaced at ERI and tachy therapies were turned off.   She has been admitted multiple times over the past year for dyspnea, secondary to her sarcoid, CHF, PNA and acute bronchitis. Her last admission was in 02/2014 for acute CHF. Discharge weight was 167 lb. Last OV with Dr. Shirlee Latch was recently on 05/20/14. Office weight was stable at 168 lb. Dr. Shirlee Latch discussed with her possible  repeat referral for lung transplantation at [facility] and ordered for her to undergo a repeat RHC. This is scheduled for 06/02/14 with Dr. Shirlee Latch. At home, she takes 60 mg of Lasix BID.   She presents back to the [facility] ED today via [facility] with multiple complaints. She notes a 3 day history of nausea, diarrhea, fatigue, intermittent pleuritic CP, subjective fever and a productive cough with green colored sputum. Occasional blood tinged sputum but no frank hemoptysis. She notes constant dyspnea, but nothing beyond her baseline. However, per RN report, on arrival she was placed on 6L O2 but did not tolerate. O2 sats dropped in the 70s and she was placed back on NRB. O2 sats now in the upper 80s. No syncope/ near syncope. She notes bilateral calf pain and edema but states this is chronic (ongoing for years). No prolonged travel. She reports full medication compliance.  In ED, BNP is elevated at 533.5. CXR shows emphysema with extensive interstitial fibrotic change, stable compared to last CXR. No frank edema or consolidation and stable cardiomegaly. Troponin is elevated x 1 at 0.04. CBC shows chronic stable anemia with Hgb of 10.3. She has a slight leukocytosis with WBC at 11.5. She is afebrile. UA is negative for UTI. Blood cultures pending. CMP shows slight hypoalbuminemia at 3.3 but LFTs are WNL. K is stable at 4.5. Her weight today is only up 2 lb beyond her dry weight at 169 lb. EKG shows sinus tach w/ PACs and right axis deviation. Telemetry continues to show sinus tach with rates in the 110s.   Therapies administered in the ED so far have included IV Zofran for nausea as well as sodium chloride infusion and acetaminophen.   Home Medications  Prior to Admission medications   Medication Sig Start Date End Date Taking? Authorizing Provider  albuterol (PROAIR HFA) 108 (90 BASE) MCG/ACT inhaler Inhale 2 puffs into the lungs 4 (four) times daily. 12/07/13  Yes Stephanie Coup Street, MD  ARIPiprazole (ABILIFY) 2 MG  tablet TAKE ONE TABLET BY MOUTH EVERY OTHER DAY 03/25/14  Yes Stephanie Coup Street, MD  aspirin EC 81 MG tablet Take 1 tablet (81 mg total) by mouth daily. 08/21/13  Yes Nanine Means, NP  benzonatate (TESSALON PERLES) 100 MG capsule Take 1 capsule (100 mg total) by mouth 2 (two) times daily as needed for cough. 01/17/14  Yes Stephanie Coup Street, MD  budesonide-formoterol [facility]) 160-4.5 MCG/ACT inhaler Inhale 1 puff into the lungs 2 (two) times daily. 11/03/13  Yes Stephanie Coup Street, MD  esomeprazole (NEXIUM) 40 MG capsule Take 1 capsule (40 mg total) by mouth every morning. 01/17/14  Yes Stephanie Coup Street, MD  furosemide (LASIX) 20 MG tablet Take 3 tablets (60 mg total) by mouth 2 (two) times daily. 05/20/14  Yes Laurey Morale, MD  PARoxetine (PAXIL) 20 MG tablet TAKE ONE  TABLET BY MOUTH ONCE DAILY 02/17/14  Yes Stephanie Coup Street, MD  potassium chloride SA (K-DUR,KLOR-CON) 20 MEQ tablet Take 1-2 tablets (20-40 mEq total) by mouth 2 (two) times daily. 40 meq in the morning and 20 meq at night 01/10/14  Yes Stephanie Coup Street, MD  predniSONE (DELTASONE) 10 MG tablet Take 2 tablets (20 mg total) by mouth daily. 04/22/14  Yes Leslye Peer, MD  sildenafil (REVATIO) 20 MG tablet Take 4 tablets (80 mg total) by mouth 3 (three) times daily. take 4  tablets every 8 hours 08/21/13  Yes Nanine Means, NP  Treprostinil (TYVASO) 0.6 MG/ML SOLN Inhale 18 mcg into the lungs 4 (four) times daily. 10/18/13  Yes Laurey Morale, MD  nystatin (MYCOSTATIN) 100000 UNIT/ML suspension Use as directed 5 mLs (500,000 Units total) in the mouth or throat 3 (three) times daily. For 5 days Patient not taking: Reported on 05/30/2014 04/21/14   Leslye Peer, MD    Family History  Family History  Problem Relation Age of Onset  . Lung cancer Father   . Cancer Father     lung cancer  . Hypertension    . Diabetes Mother     border line  . Hypertension Mother   . Heart failure Mother     Social  History  History   Social History  . Marital Status: Divorced    Spouse Name: N/A  . Number of Children: N/A  . Years of Education: N/A   Occupational History  . Not on file.   Social History Main Topics  . Smoking status: Former Smoker -- 1.50 packs/day for 11 years    Types: Cigarettes    Quit date: 04/08/1992  . Smokeless tobacco: Never Used     Comment: 1ppd x 20 years  . Alcohol Use: No     Comment: remote history of alcohol abuse (quit 1994)  . Drug Use: No  . Sexual Activity: No   Other Topics Concern  . Not on file   Social History Narrative     Review of Systems General:  No chills, fever, night sweats or weight changes.  Cardiovascular:  No chest pain, + Chronic dyspnea on exertion, edema, orthopnea, palpitations, paroxysmal nocturnal dyspnea. Dermatological: No rash, lesions/masses Respiratory: No cough, + Chronic dyspnea Urologic: No hematuria, dysuria Abdominal: + nausea, no vomiting, + diarrhea, no bright red blood per rectum, melena, or hematemesis Neurologic:  No visual changes, wkns, changes in mental status. All other systems reviewed and are otherwise negative except as noted above.  Physical Exam  Blood pressure 101/71, pulse 113, temperature 99.4 F (37.4 C), temperature source Rectal, resp. rate 18, weight 169 lb 8 oz (76.885 kg), SpO2 90 %.  General: Pleasant, NAD, moderately obses Psych: Normal affect. Neuro: Alert and oriented X 3. Moves all extremities spontaneously. HEENT: Normal  Neck: No bruits. + elevated JVD at level of ear with + HJR. Lungs: bilateral diffuse crackles c/w fibrosis. Mild expiratory wheezing Heart: tachy rate, reg rhythm no s3, s4,  Soft 1/6 murmur at the LUSB. Abdomen: Soft, RUQ tenderness, non-distended, BS + x 4.  Extremities: No clubbing, cyanosis or edema. DP/PT/Radials 2+ and equal bilaterally.  Labs  Troponin [facility] Test)  Recent Labs  05/30/14 1013  TROPIPOC 0.03    Recent Labs   05/30/14 1014  TROPONINI 0.04*   Lab Results  Component Value Date   WBC 11.5* 05/30/2014   HGB 10.3* 05/30/2014   HCT 34.9* 05/30/2014  MCV 87.5 05/30/2014   PLT 348 05/30/2014    Recent Labs Lab 05/30/14 0950  NA 143  K 4.5  CL 101  CO2 35*  BUN 15  CREATININE 1.04  CALCIUM 9.1  PROT 6.6  BILITOT 0.8  ALKPHOS 68  ALT 28  AST 25  GLUCOSE 105*   Lab Results  Component Value Date   CHOL 192 01/04/2013   HDL 99.50 01/04/2013   LDLCALC 77 01/04/2013   TRIG 77.0 01/04/2013   Lab Results  Component Value Date   DDIMER 0.51* 11/25/2013     Radiology/Studies  Dg Chest 2 View  05/30/2014   CLINICAL DATA:  Difficulty breathing. History of COPD and hypertension  EXAM: CHEST  2 VIEW  COMPARISON:  February 03, 2014  FINDINGS: There is underlying emphysema. There is extensive interstitial fibrosis, particularly in the upper lobes. There is no frank edema or consolidation. Heart is enlarged with pulmonary vascularity within normal limits. Pacemaker lead is attached to the right ventricle. There is no demonstrable adenopathy. No bone lesions.  IMPRESSION: Emphysema with extensive interstitial fibrotic change, stable. No frank edema or consolidation. Stable cardiomegaly.   Electronically Signed   By: Bretta Bang III M.D.   On: 05/30/2014 12:05    ECG  EKG shows sinus tach w/ PACs and right axis deviation.    ASSESSMENT AND PLAN  1. Acute on Chronic diastolic CHF with Primary RV failure: Physical exam is notable for elevated JVD and RUQ tenderness (LFTs are WNL). No significant peripheral edema. BNP elevated at 533. Her weight is only up 2lb beyond her dry weight of 167 lb and only 1 lb up from when she was seen in clinic on 05/20/14. She has diffuse bilateral crackles on exam but likely from pulmonary fibrosis. CXR is w/o edema. Can give a dose of IV Lasix x 1 and reassess in the am. Daily weights. Strict I/Os. RHC per Dr. Shirlee Latch on 2/25.  2. Acute on chronic respiratory  failure: likely multifactorial, given pulmonary fibrosis from sarcoid and acute CHF. She is mildly volume overloaded. Per RN report, on arrival she was placed on 6L O2 but did not tolerate. O2 sats dropped in the 70s and she was placed back on NRB. O2 sats now in the upper 80s (her baseline). With increased O2 requirements and 3 day history of pleuritic CP + sinus tachycardia would recommend checking a d-dimer to r/o PE. Pulmonary/CC admitting.   3. Elevated Troponin: minimally elevated at 0.04. Likely subsequent to demand ischemia given acute CHF.   4. Pulmonary Sarcoid: Pulmonary following.   MD to follow with further recommendations.    Beau Fanny, PA-C 05/30/2014, 2:41 PM  Patient seen and examined with Robbie Lis, NP. We discussed all aspects of the encounter. I agree with the assessment and plan as stated above.   She has end-stage RHF and cor pulmonale in the setting of sarcoidosis. She has only mild volume overload and I think the majority of her symptoms are related to her parenchymal lung disease. Will diurese with IV lasix to improve her as much as we can. Dr. Shirlee Latch will perform RHC while in house as part of initial lung Tx work-up.    Havier Deeb,MD 4:18 PM

## 2014-05-30 NOTE — ED Notes (Signed)
Pt presents via [facility] c/o N/V/D since Friday.  Pt has hx of CHF and COPD, pt wears 6L O2 at home.  Pt currently on NRB 98%.  Pt a x 4.

## 2014-05-30 NOTE — Progress Notes (Signed)
ANTIBIOTIC CONSULT NOTE - INITIAL  Pharmacy Consult for Vancomycin / Cefepime Indication: pneumonia  Allergies  Allergen Reactions  . Latex Rash  . Nitroglycerin Other (See Comments)    Patient is on Revatio  . Other Other (See Comments)    High Dose Steroids cause chemical imbalance and altered mental status  . Meperidine Hcl Other (See Comments)    REACTION: Makes her feel funny  . Tramadol Hcl Other (See Comments)    REACTION: nausea, lightheadedness, sleepiness, and dizziness  . Ace Inhibitors Other (See Comments)    unknown  . Morphine Other (See Comments)    Loopy, light headed feeling    . Propoxyphene N-Acetaminophen Rash    Patient Measurements: Weight: 169 lb 8 oz (76.885 kg)  Vital Signs: Temp: 99.4 F (37.4 C) (02/22 1047) Temp Source: Rectal (02/22 1047) BP: 101/74 mmHg (02/22 1540) Pulse Rate: 114 (02/22 1540) Intake/Output from previous day:   Intake/Output from this shift:    Labs:  Recent Labs  05/30/14 0950  WBC 11.5*  HGB 10.3*  PLT 348  CREATININE 1.04   Estimated Creatinine Clearance: 61.4 mL/min (by C-G formula based on Cr of 1.04). No results for input(s): VANCOTROUGH, VANCOPEAK, VANCORANDOM, GENTTROUGH, GENTPEAK, GENTRANDOM, TOBRATROUGH, TOBRAPEAK, TOBRARND, AMIKACINPEAK, AMIKACINTROU, AMIKACIN in the last 72 hours.   Microbiology: Recent Results (from the past 720 hour(s))  Blood Culture (routine x 2)     Status: None (Preliminary result)   Collection Time: 05/30/14  2:42 PM  Result Value Ref Range Status   Specimen Description BLOOD RIGHT HAND  Final   Special Requests BOTTLES DRAWN AEROBIC AND ANAEROBIC 5CC  Final   Culture PENDING  Incomplete   Report Status PENDING  Incomplete    Medical History: Past Medical History  Diagnosis Date  . GERD (gastroesophageal reflux disease)   . Hypertension   . 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 CHF (congestive heart failure)     Right side- Dr. Aundra Dubin ([facility])  . Pulmonary HTN    . Anxiety   . Cardiomyopathy   . NSVT (nonsustained ventricular tachycardia)     with syncope: St Jude ICD was implanted. Device now nearing ERI. Given end stage lung disease and normalization of LV function, the device will not be replaced and tachy therapies were turned off  . Chronic chest pain     LHC (08/2008) with no angiographic coronary diease  . Allergic rhinitis   . H/O: iron deficiency anemia   . Secondary amenorrhea     irregular menses  . Psychosis     with high dose steroids  . SVT (supraventricular tachycardia)     possible runs  . Hypokalemia     recurrent  . Rotator cuff sprain   . Chronic back pain   . COPD (chronic obstructive pulmonary disease)   . 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  Slight fever 99.4, WBC elevated 11.5 - on home prednisone. Plant to begin broad spectrum ABX.   Goal of Therapy:  Vancomycin trough level 15-20 mcg/ml  Plan:  Vancomycin 1gm IV q12 Cefepime 1gm IV q8  Bonnita Nasuti Pharm.D. CPP, BCPS Clinical Pharmacist [phone] 05/30/2014 6:07 PM

## 2014-05-31 ENCOUNTER — Other Ambulatory Visit: Payer: Self-pay

## 2014-05-31 DIAGNOSIS — I509 Heart failure, unspecified: Secondary | ICD-10-CM

## 2014-05-31 LAB — COMPREHENSIVE METABOLIC PANEL
ALK PHOS: 65 U/L (ref 39–117)
ALT: 48 U/L — AB (ref 0–35)
AST: 44 U/L — AB (ref 0–37)
Albumin: 3.3 g/dL — ABNORMAL LOW (ref 3.5–5.2)
Anion gap: 6 (ref 5–15)
BUN: 22 mg/dL (ref 6–23)
CO2: 32 mmol/L (ref 19–32)
Calcium: 8.8 mg/dL (ref 8.4–10.5)
Chloride: 103 mmol/L (ref 96–112)
Creatinine, Ser: 1.13 mg/dL — ABNORMAL HIGH (ref 0.50–1.10)
GFR, EST AFRICAN AMERICAN: 63 mL/min — AB (ref >90)
GFR, EST NON AFRICAN AMERICAN: 54 mL/min — AB (ref >90)
Glucose, Bld: 130 mg/dL — ABNORMAL HIGH (ref 70–99)
Potassium: 5.1 mmol/L (ref 3.5–5.1)
SODIUM: 141 mmol/L (ref 135–145)
Total Bilirubin: 1.6 mg/dL — ABNORMAL HIGH (ref 0.3–1.2)
Total Protein: 6.5 g/dL (ref 6.0–8.3)

## 2014-05-31 LAB — CBC
HEMATOCRIT: 36.3 % (ref 36.0–46.0)
HEMOGLOBIN: 10.5 g/dL — AB (ref 12.0–15.0)
MCH: 26.4 pg (ref 26.0–34.0)
MCHC: 28.9 g/dL — AB (ref 30.0–36.0)
MCV: 91.4 fL (ref 78.0–100.0)
Platelets: 371 10*3/uL (ref 150–400)
RBC: 3.97 MIL/uL (ref 3.87–5.11)
RDW: 18.9 % — ABNORMAL HIGH (ref 11.5–15.5)
WBC: 11.5 10*3/uL — ABNORMAL HIGH (ref 4.0–10.5)

## 2014-05-31 LAB — EXPECTORATED SPUTUM ASSESSMENT W GRAM STAIN, RFLX TO RESP C

## 2014-05-31 LAB — URINE CULTURE: Colony Count: 30000

## 2014-05-31 LAB — INFLUENZA PANEL BY PCR (TYPE A & B)
H1N1 flu by pcr: NOT DETECTED
INFLBPCR: NEGATIVE
Influenza A By PCR: NEGATIVE

## 2014-05-31 LAB — TROPONIN I
TROPONIN I: 0.03 ng/mL (ref <0.031)
Troponin I: 0.03 ng/mL (ref <0.031)

## 2014-05-31 LAB — EXPECTORATED SPUTUM ASSESSMENT W REFEX TO RESP CULTURE

## 2014-05-31 LAB — MAGNESIUM: MAGNESIUM: 2.1 mg/dL (ref 1.5–2.5)

## 2014-05-31 LAB — STREP PNEUMONIAE URINARY ANTIGEN: STREP PNEUMO URINARY ANTIGEN: NEGATIVE

## 2014-05-31 LAB — HEPARIN LEVEL (UNFRACTIONATED): HEPARIN UNFRACTIONATED: 0.79 [IU]/mL — AB (ref 0.30–0.70)

## 2014-05-31 LAB — HIV ANTIBODY (ROUTINE TESTING W REFLEX): HIV SCREEN 4TH GENERATION: NONREACTIVE

## 2014-05-31 LAB — POTASSIUM: Potassium: 5.1 mmol/L (ref 3.5–5.1)

## 2014-05-31 MED ORDER — ADENOSINE 6 MG/2ML IV SOLN
INTRAVENOUS | Status: AC
  Filled 2014-05-31: qty 2

## 2014-05-31 MED ORDER — FUROSEMIDE 10 MG/ML IJ SOLN: 80.0000 mg | Freq: Two times a day (BID) | INTRAMUSCULAR | Status: DC

## 2014-05-31 MED ORDER — FUROSEMIDE 10 MG/ML IJ SOLN
80.0000 mg | Freq: Two times a day (BID) | INTRAMUSCULAR | Status: DC
  Administered 2014-05-31 – 2014-06-01 (×2): 80 mg via INTRAVENOUS
  Filled 2014-05-31 (×4): qty 8

## 2014-05-31 MED ORDER — AMIODARONE HCL IN DEXTROSE 360-4.14 MG/200ML-% IV SOLN
30.0000 mg/h | INTRAVENOUS | Status: DC
Start: 2014-05-31 — End: 2014-06-01
  Administered 2014-05-31 – 2014-06-01 (×2): 30 mg/h via INTRAVENOUS
  Filled 2014-05-31 (×2): qty 200

## 2014-05-31 MED ORDER — HEPARIN (PORCINE) IN NACL 100-0.45 UNIT/ML-% IJ SOLN
1050.0000 [IU]/h | INTRAMUSCULAR | Status: DC
  Administered 2014-05-31: 1150 [IU]/h via INTRAVENOUS
  Filled 2014-05-31: qty 250

## 2014-05-31 MED ORDER — AMIODARONE HCL IN DEXTROSE 360-4.14 MG/200ML-% IV SOLN
60.0000 mg/h | INTRAVENOUS | Status: AC
Start: 2014-05-31 — End: 2014-05-31
  Administered 2014-05-31 (×2): 60 mg/h via INTRAVENOUS

## 2014-05-31 MED ORDER — AMIODARONE IV BOLUS ONLY 150 MG/100ML
150.0000 mg | Freq: Once | INTRAVENOUS | Status: AC
  Administered 2014-05-31: 150 mg via INTRAVENOUS

## 2014-05-31 MED ORDER — TREPROSTINIL 0.6 MG/ML IN SOLN
18.0000 ug | Freq: Four times a day (QID) | RESPIRATORY_TRACT | Status: DC
  Administered 2014-05-31 – 2014-06-03 (×11): 18 ug via RESPIRATORY_TRACT
  Filled 2014-05-31: qty 0.03

## 2014-05-31 MED ORDER — AMIODARONE HCL IN DEXTROSE 360-4.14 MG/200ML-% IV SOLN
INTRAVENOUS | Status: AC
  Filled 2014-05-31: qty 200

## 2014-05-31 MED ORDER — ADENOSINE 12 MG/4ML IV SOLN
12.0000 mg | Freq: Once | INTRAVENOUS | Status: AC
  Administered 2014-05-31: 12 mg via INTRAVENOUS

## 2014-05-31 MED ORDER — FUROSEMIDE 10 MG/ML IJ SOLN
80.0000 mg | Freq: Once | INTRAMUSCULAR | Status: AC
  Administered 2014-05-31: 80 mg via INTRAVENOUS
  Filled 2014-05-31: qty 8

## 2014-05-31 MED ORDER — ADENOSINE 6 MG/2ML IV SOLN
6.0000 mg | Freq: Once | INTRAVENOUS | Status: AC
  Administered 2014-05-31: 6 mg via INTRAVENOUS

## 2014-05-31 NOTE — Progress Notes (Signed)
  Amiodarone Drug - Drug Interaction Consult Note  Recommendations: SSRIs such as Paxil have the potential to prolong QTc. QTc was 462-476 on admission. Consider monitoring QTc while on amiodarone.   Amiodarone is metabolized by the cytochrome P450 system and therefore has the potential to cause many drug interactions. Amiodarone has an average plasma half-life of 50 days (range 20 to 100 days).   There is potential for drug interactions to occur several weeks or months after stopping treatment and the onset of drug interactions may be slow after initiating amiodarone.   _0  Statins: Increased risk of myopathy. Simvastatin- restrict dose to 42m daily. Other statins: counsel patients to report any muscle pain or weakness immediately.  _1  Anticoagulants: Amiodarone can increase anticoagulant effect. Consider warfarin dose reduction. Patients should be monitored closely and the dose of anticoagulant altered accordingly, remembering that amiodarone levels take several weeks to stabilize.  _2  Antiepileptics: Amiodarone can increase plasma concentration of phenytoin, the dose should be reduced. Note that small changes in phenytoin dose can result in large changes in levels. Monitor patient and counsel on signs of toxicity.  _3  Beta blockers: increased risk of bradycardia, AV block and myocardial depression. Sotalol - avoid concomitant use.  _4   Calcium channel blockers (diltiazem and verapamil): increased risk of bradycardia, AV block and myocardial depression.  _5   Cyclosporine: Amiodarone increases levels of cyclosporine. Reduced dose of cyclosporine is recommended.  _6  Digoxin dose should be halved when amiodarone is started.  _7  Diuretics: increased risk of cardiotoxicity if hypokalemia occurs.  _8  Oral hypoglycemic agents (glyburide, glipizide, glimepiride): increased risk of hypoglycemia. Patient's glucose levels should be monitored closely when initiating amiodarone therapy.   _9  Drugs  that prolong the QT interval:  Torsades de pointes risk may be increased with concurrent use - avoid if possible.  Monitor QTc, also keep magnesium/potassium WNL if concurrent therapy can't be avoided. .Marland KitchenAntibiotics: e.g. fluoroquinolones, erythromycin. . Antiarrhythmics: e.g. quinidine, procainamide, disopyramide, sotalol. . Antipsychotics: e.g. phenothiazines, haloperidol.  . Lithium, tricyclic antidepressants, and methadone.  Thank You,   BAlbertina Parr PharmD., BCPS Clinical Pharmacist Pager [phone]

## 2014-05-31 NOTE — Consult Note (Addendum)
PULMONARY / CRITICAL CARE MEDICINE   Name: Rachel [name] MRN: [id number] DOB: May 06, 1960    ADMISSION DATE:  05/30/2014 CONSULTATION DATE:  05/30/2014  REFERRING MD :  EDP  CHIEF COMPLAINT:  Productive cough  INITIAL PRESENTATION: 54 year old female with severe sarcoidosis and PAH presented to [facility] ED 2/22 c/o productive cough, vomiting, and weakness for several weeks. In ED she was hypoxemic requiring 100% NRB. PCCM consulted  STUDIES:  Echo 9/15 >EF 55-60%, moderately dilated RV with mildly decreased systolic function, PA systolic pressure 54.   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  She is an RB patient,  With chronic hypoxemia on 6L O2 at home, was a potential candidate for heart and lung transplant in the past, but was denied at [facility] in 2010. She takes Revatio and Tyvasto at home. She is scheduled for [place] 2/25 under Dr. Aundra Dubin who is her primary cardiologist. She was seen by him in the office 2/12 at which time her lasix was increased due to progressive LE edema. Plan was to pursue transplant workup again.  Most recent pulmonary appointment 1/14. It was recommended that she increase her O2 and prednisone, however, she was opposed to these changes - the O2 bothers her nose, high dose pred gives her severe side effects.  She presented to [facility] ED 2/22 c/o weakness, malaise, diarrhea that started 5 days PTA.  She has also had productive cough for 2 weeks. She was noted to be hypoxemic and was placed on 100% NRB en route to ED. In ED she was found to have stable chronic findings on CXR with mild leukocytosis and elevated BNP. She was hypoxic, requiring NRB to maintain sats in low 80s (85-90% is her baseline). Her home steroids were continued and she was started on empiric HCAP coverage. She was admitted to [facility]. PCCM consulted for  further eval.   SUBJECTIVE:    VITAL SIGNS: Temp:  [97.3 F (36.3 C)-99.6 F (37.6 C)] 98.8 F (37.1 C) (02/23 0804) Pulse Rate:  [92-118] 92 (02/23 0804) Resp:  [11-24] 16 (02/23 0804) BP: (96-124)/(63-86) 109/65 mmHg (02/23 0804) SpO2:  [85 %-100 %] 93 % (02/23 0841) FiO2 (%):  [100 %] 100 % (02/22 2039) Weight:  [76.885 kg (169 lb 8 oz)-77.8 kg (171 lb 8.3 oz)] 77.565 kg (171 lb) (02/23 0409) HEMODYNAMICS:   VENTILATOR SETTINGS: Vent Mode:  [-]  FiO2 (%):  [100 %] 100 % INTAKE / OUTPUT:  Intake/Output Summary (Last 24 hours) at 05/31/14 1025 Last data filed at 05/31/14 1011  Gross per 24 hour  Intake 2448.33 ml  Output   1100 ml  Net 1348.33 ml    PHYSICAL EXAMINATION: General:  Weak, but comfortable Neuro:  Awake, oriented, non-focal HEENT:  OP dry, PERRL, visible JVP 6+ cm Cardiovascular:  Regular, loud s2 Lungs:  Distant, B insp crackles Abdomen:  Soft, benign Musculoskeletal:  Trace pre-tibial edema Skin:  No rash  LABS:  CBC  Recent Labs Lab 05/30/14 0950 05/31/14 0713  WBC 11.5* 11.5*  HGB 10.3* 10.5*  HCT 34.9* 36.3  PLT 348 371   Coag's No results for input(s): APTT, INR in the last 168 hours. BMET  Recent Labs Lab 05/30/14 0950 05/31/14 0713  NA 143 141  K 4.5 5.1  CL 101 103  CO2 35* 32  BUN 15 22  CREATININE 1.04 1.13*  GLUCOSE 105* 130*   Electrolytes  Recent Labs Lab 05/30/14 0950 05/31/14 0713  CALCIUM 9.1 8.8   Sepsis Markers  Recent Labs Lab 05/30/14 1020 05/30/14 1443  LATICACIDVEN 1.52 1.21   ABG No results for input(s): PHART, PCO2ART, PO2ART in the last 168 hours. Liver Enzymes  Recent Labs Lab 05/30/14 0950 05/31/14 0713  AST 25 44*  ALT 28 48*  ALKPHOS 68 65  BILITOT 0.8 1.6*  ALBUMIN 3.3* 3.3*   Cardiac Enzymes  Recent Labs Lab 05/30/14 1834 05/30/14 2311 05/31/14 0713  TROPONINI 0.04* 0.03 0.03   Glucose No results for input(s): GLUCAP in the last 168 hours.  Imaging Dg Chest 2  View  05/30/2014   CLINICAL DATA:  Difficulty breathing. History of COPD and hypertension  EXAM: CHEST  2 VIEW  COMPARISON:  February 03, 2014  FINDINGS: There is underlying emphysema. There is extensive interstitial fibrosis, particularly in the upper lobes. There is no frank edema or consolidation. Heart is enlarged with pulmonary vascularity within normal limits. Pacemaker lead is attached to the right ventricle. There is no demonstrable adenopathy. No bone lesions.  IMPRESSION: Emphysema with extensive interstitial fibrotic change, stable. No frank edema or consolidation. Stable cardiomegaly.   Electronically Signed   By: Lowella Grip III M.D.   On: 05/30/2014 12:05     ASSESSMENT / PLAN:  Acute on chronic respiratory failure in setting acute infection superimposed on PAH / sarcoidosis  Sarcoidosis Stage IV with severe parenchymal scar and infiltrate Severe secondary PAH Chronic hypoxemia Apparent acute viral syndrome with diarrhea, URI sx Possible PNA, doubt. Her pulmonary infiltrates are chronic  Recent volume losses due to diarrhea and poor PO intake > total body volume overload and anasarca (chronic) with acute hypovolemia.   Recs:  - agree with empiric abx - scheduled bronchodilators - maximize O2 (acknowledge she is chronically low at home, which is why she ultimately decompensates) - continue Revatio and homeTyvaso. She has felt that these medications have benefited her over the years, although overall data would support an ERB over these meds in setting sarcoidosis.  - will have to diurese carefully and slowly to address her chronic decompensation / anasarca because in recent days she is actually volume depleted. An indwelling PA-c may help guide diuresis, balance w cardiac output. More likely can defer measurement of PAPs until after some empiric diuresis and wt loss.  - will discuss overall plans with Dr Aundra Dubin, particularly regarding whether she will be able to face the rigors  of transplant. She would likely need Heart + Lung Tx. I have concerns about whether she could tolerate. She would need to have great home support system, stellar compliance, fund raising efforts, etc.   - her son [name] tells me that her social situation may be influencing her stress level and/or ability to take her meds > pt's daughter + boyfriend + 3 kids have moved in with her in a 2 bedroom home. [name] notices a difference in her health since this change happened. May want to address with her, see what options exist to help here - will follow with you   Baltazar Apo, MD, PhD 05/31/2014, 10:25 AM [facility] [phone] or if no answer [phone]

## 2014-05-31 NOTE — Progress Notes (Signed)
Okay to d/c enteric precautions for C. Diff since patient has had no bowel movements since admission to hospital. If patient does experience bowel movements, the nursing staff will follow the c diff protocol. Sandre Kitty

## 2014-05-31 NOTE — Progress Notes (Signed)
Family Medicine Teaching Service Daily Progress Note Intern Pager: [phone]  Patient name: Rachel [name] Medical record number: [id number] Date of birth: [birth year] Age: 54 y.o. Gender: female  Primary Care Provider: Christa See, MD Consultants: Cardiology, Pulmonology Code Status: Full Code (addressed this admission)  Pt Overview and Major Events to Date:  2/22-2/23: Admitted with acute on chronic respiratory failure, acute infection superimposed on [place] with sarcoidosis IV and acute on chronic CHF with Cor Pulmonale.    Assessment and Plan: Rachel [name] is a 54 y.o. female presenting with worsening shortness of breath, increase oxygen requirement, and acute resp illness with diarrhea.Acute on chronic respiratory failure in the setting of infection with PAH, Sarcoidosis IV, and acute on chronic CHF with Cor pulmonale. PMH is significant for severe pHTN, pulmonary sarcoidosis, RV failure, HTN, Anxiety, cardiomyopathy, NSVT and Psychosis.  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 She was referred to [facility] in the past for lung transplant evaluation. Pt admits to cough and increase in sputum production for at least a week. Pts office allergy list includes doxy and high dose steroids (psychosis). Chest X-ray with Emphysema with extensive interstitial fibrotic change, stable. No frank edema or consolidation. Stable cardiomegaly. Lactic acid 1.21, WBC 11.5. BNP 533.5 (314- 10 days ago). - Following pulm and cards recs.  - Continue to treat HCAP with cefepime / Vanc  - Cultures pending. Legionella / RVP pending.  - Strep pneumo urine negative.  - Pt. Refusing bipap and ABG's at this time.  - Continue workup for lung tx per pulm and cards.  - Pt currently on 100% non-re breather with saturations low 95%  - Continue PAH medications: Revatio 80 mg TID/Tyvaso 18 mcg QID - Will keep steroids at 20 mg for now, Psychosis  with high dose steroids per chart, will defer to pulm for increase.  - albuterol PRN  Cardiomyopathy: last Echo (9/15) with EF 55-60%, moderately dilated RV with mildly decreased systolic function, PA systolic pressure 54.  - Trop mildly elevated >> demand ischemia per cards.  - Elevated BNP 533.  - Following cards recs as below.  - TSH normal.  - EKG: ST, Atrial premature complexes, Borderline right axis deviation - EKG PRN for chest pain, and in am  Pulmonary hypertension with RV failure: last Echo (9/15) with EF 55-60%, moderately dilated RV with mildly decreased systolic function, PA systolic pressure 54. Pt scheduled for cath 2/25 with Dr. Aundra Dubin - Cardiology consulted. Cath on 2/25 per Dr. Aundra Dubin as part of workup for tx.  - Per pulm may need heart / lung txp.  - Diuresis per cards / pulm.  - Follow weights and I/O's. None overnight. Weight at baseline.  - Continued lasix 60 mg BID for now, will defer to cards for increase with BNP of 533 today and edema.  - Continued Revatio, Tyvaso  NSVT with syncope: St Jude ICD was implanted. Given end stage lung disease and normalization of LV function, the device was not replaced at ERI and tachy therapies were turned off.   Anxiety/Depression: Paxil 20 mg daily.  GERD: Continue PPI  FEN/GI: sips/chips meds, will hold on MIVF considering edema and increase in BNP Prophylaxis: Hep sq, PPI  Disposition: Pending cards / pulm recs.   Subjective:  Pt. Feels that she is at her baseline fluid status. She feels SOB, but nonrebreather is helping. She says that she cannot tell the difference in dyspnea with ambulation  vs. Resting. She is tired this morning.   Objective: Temp:  [97.3 F (36.3 C)-99.6 F (37.6 C)] 98.8 F (37.1 C) (02/23 0804) Pulse Rate:  [92-118] 92 (02/23 0804) Resp:  [11-24] 16 (02/23 0804) BP: (96-124)/(48-86) 109/65 mmHg (02/23 0804) SpO2:  [85 %-100 %] 93 % (02/23 0841) FiO2 (%):  [100 %] 100 % (02/22  2039) Weight:  [169 lb 8 oz (76.885 kg)-171 lb 8.3 oz (77.8 kg)] 171 lb (77.565 kg) (02/23 0409) Physical Exam: General: NAD, Resting on her side, appears tired.  Cardiovascular: RRR, RSB systolic ejection murmur 2/6, No gallops, no rubs. 2+ distal pulses, mild JVP.  Respiratory: Slight crackles in BL bases R > L, no wheezes, or rales. Appropriate rate, mildly labored with nonrebreather in place 12L  Abdomen: S, Mild TTP over RUQ, Liver edge smooth, just below costal margin, +BS, No organomegaly.  Extremities: WWP, Trace lower extremity edema.   Laboratory:  Recent Labs Lab 05/30/14 0950 05/31/14 0713  WBC 11.5* 11.5*  HGB 10.3* 10.5*  HCT 34.9* 36.3  PLT 348 371    Recent Labs Lab 05/30/14 0950 05/31/14 0713  NA 143 141  K 4.5 5.1  CL 101 103  CO2 35* 32  BUN 15 22  CREATININE 1.04 1.13*  CALCIUM 9.1 8.8  PROT 6.6 6.5  BILITOT 0.8 1.6*  ALKPHOS 68 65  ALT 28 48*  AST 25 44*  GLUCOSE 105* 130*    RVP - pending Legionella - pending Strep pneumo urine - negative.   Trop - 0.04 > 0.03 TSH - 1.048 Lactic Acid - 1.21   Imaging/Diagnostic Tests:  CXR 2/22 - IMPRESSION: Emphysema with extensive interstitial fibrotic change, stable. No frank edema or consolidation. Stable cardiomegaly.   Aquilla Hacker, MD 05/31/2014, 9:20 AM PGY-1, [place] Intern pager: [phone], text pages welcome

## 2014-05-31 NOTE — Progress Notes (Signed)
[facility]  Patient Status: Active (receiving services up to time of hospitalization)  [facility] is providing the following services: RN  If patient discharges after hours, please call [phone].   Rachel [name] 05/31/2014, 3:37 PM

## 2014-05-31 NOTE — Progress Notes (Signed)
ANTICOAGULATION CONSULT NOTE - Initial Consult  Pharmacy Consult for Heparin  Indication: atrial fibrillation  Allergies  Allergen Reactions  . Latex Rash  . Nitroglycerin Other (See Comments)    Patient is on Revatio  . Other Other (See Comments)    High Dose Steroids cause chemical imbalance and altered mental status  . Meperidine Hcl Other (See Comments)    REACTION: Makes her feel funny  . Tramadol Hcl Other (See Comments)    REACTION: nausea, lightheadedness, sleepiness, and dizziness  . Ace Inhibitors Other (See Comments)    unknown  . Morphine Other (See Comments)    Loopy, light headed feeling    . Propoxyphene N-Acetaminophen Rash    Patient Measurements: Height: _0  (160 cm) Weight: 171 lb (77.565 kg) IBW/kg (Calculated) : 52.4 Heparin Dosing Weight: 69 kg   Vital Signs: Temp: 98.6 F (37 C) (02/23 1137) Temp Source: Oral (02/23 1137) BP: 107/76 mmHg (02/23 1137) Pulse Rate: 97 (02/23 1137)  Labs:  Recent Labs  05/30/14 0950  05/30/14 1834 05/30/14 2311 05/31/14 0713  HGB 10.3*  --   --   --  10.5*  HCT 34.9*  --   --   --  36.3  PLT 348  --   --   --  371  CREATININE 1.04  --   --   --  1.13*  TROPONINI  --   < > 0.04* 0.03 0.03  < > = values in this interval not displayed.  Estimated Creatinine Clearance: 56.8 mL/min (by C-G formula based on Cr of 1.13).   Medical History: Past Medical History  Diagnosis Date  . GERD (gastroesophageal reflux disease)   . Hypertension   . 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 CHF (congestive heart failure)     Right side- Dr. Aundra Dubin ([place])  . Pulmonary HTN   . Anxiety   . Cardiomyopathy   . NSVT (nonsustained ventricular tachycardia)     with syncope: St Jude ICD was implanted. Device now nearing ERI. Given end stage lung disease and normalization of LV function, the device will not be replaced and tachy therapies were turned off  . Chronic chest pain     LHC (08/2008) with no angiographic coronary  diease  . Allergic rhinitis   . H/O: iron deficiency anemia   . Secondary amenorrhea     irregular menses  . Psychosis     with high dose steroids  . SVT (supraventricular tachycardia)     possible runs  . Hypokalemia     recurrent  . Rotator cuff sprain   . Chronic back pain   . COPD (chronic obstructive pulmonary disease)   . Shortness of breath     Medications:  Prescriptions prior to admission  Medication Sig Dispense Refill Last Dose  . albuterol (PROAIR HFA) 108 (90 BASE) MCG/ACT inhaler Inhale 2 puffs into the lungs 4 (four) times daily. 18 g 11 05/29/2014 at Unknown time  . ARIPiprazole (ABILIFY) 2 MG tablet TAKE ONE TABLET BY MOUTH EVERY OTHER DAY 30 tablet 3 05/29/2014 at Unknown time  . aspirin EC 81 MG tablet Take 1 tablet (81 mg total) by mouth daily. 1 tablet 0 05/30/2014 at Unknown time  . benzonatate (TESSALON PERLES) 100 MG capsule Take 1 capsule (100 mg total) by mouth 2 (two) times daily as needed for cough. 30 capsule 0 Past Month at Unknown time  . budesonide-formoterol (SYMBICORT) 160-4.5 MCG/ACT inhaler Inhale 1 puff into the  lungs 2 (two) times daily. 1 Inhaler 3 05/30/2014 at Unknown time  . esomeprazole (NEXIUM) 40 MG capsule Take 1 capsule (40 mg total) by mouth every morning. 30 capsule 3 05/30/2014 at Unknown time  . furosemide (LASIX) 20 MG tablet Take 3 tablets (60 mg total) by mouth 2 (two) times daily. 90 tablet 3 05/29/2014 at Unknown time  . PARoxetine (PAXIL) 20 MG tablet TAKE ONE TABLET BY MOUTH ONCE DAILY 30 tablet 3 05/29/2014 at Unknown time  . potassium chloride SA (K-DUR,KLOR-CON) 20 MEQ tablet Take 1-2 tablets (20-40 mEq total) by mouth 2 (two) times daily. 40 meq in the morning and 20 meq at night 90 tablet 3 05/29/2014 at Unknown time  . predniSONE (DELTASONE) 10 MG tablet Take 2 tablets (20 mg total) by mouth daily. 60 tablet 1 05/29/2014 at Unknown time  . sildenafil (REVATIO) 20 MG tablet Take 4 tablets (80 mg total) by mouth 3 (three) times daily.  take 4  tablets every 8 hours 10 tablet 0 05/30/2014 at Unknown time  . Treprostinil (TYVASO) 0.6 MG/ML SOLN Inhale 18 mcg into the lungs 4 (four) times daily. 3.6 mL 11 05/30/2014 at Unknown time  . nystatin (MYCOSTATIN) 100000 UNIT/ML suspension Use as directed 5 mLs (500,000 Units total) in the mouth or throat 3 (three) times daily. For 5 days (Patient not taking: Reported on 05/30/2014) 75 mL 0 Completed Course at Unknown time    Assessment: 16 YOF who presented with cough, vomiting and weakness. After admission, patient became tachycardic and EKG showed SVT versus atypical A flutter. Patient was given Amiodarone bolus followed by amiodarone drip. Pharmacy now consulted to start IV heparin. Hgb is low but plt wnl. Patient was not on any anticoagulation prior to admission but has received two subQ heparin injection while admitted.   Goal of Therapy:  Heparin level 0.3-0.7 units/ml Monitor platelets by anticoagulation protocol: Yes   Plan:  -Start heparin infusion at 1150 units/hr. NO BOLUS  -F/u 6 hr HL  -Monitor daily HL, CBC and s/s of bleeding   Albertina Parr, PharmD., BCPS Clinical Pharmacist Pager [phone]

## 2014-05-31 NOTE — Progress Notes (Signed)
PT Cancellation Note  Patient Details Name: Rachel [name] MRN: [id number] DOB: [birth year]   Cancelled Treatment:    Reason Eval/Treat Not Completed: Medical issues which prohibited therapy. Spoke with RN, who reports O2 sats are dropping into the 50s sitting EOB. She also reports they are trying to pull some fluid off the pt today, so she may be ready for PT eval tomorrow.  PT to follow up.   Lorriane Shire 05/31/2014, 10:14 AM

## 2014-05-31 NOTE — Progress Notes (Signed)
  Called by nursing staff for tachycardia. EKG obtained SVT versus atypical A flutter in the 140-150s.   SBP stable 112/57 and she was alert and oriented.   Discussed with Dr Aundra Dubin and she was given 6 mg adenosine with no improvement then given another 12 mg adenosine. She remained tachycardic no change in rate.   Given 150 mg amiodarone bolus followed by amiodarone drip. Converted to NSR 90s.   Start heparin drip per pharmacy.   Dr Aundra Dubin aware.   Kyrra Prada  NP-C   2:53 PM

## 2014-05-31 NOTE — Care Management Note (Addendum)
    Page 1 of 2   06/07/2014     8:11:28 AM CARE MANAGEMENT NOTE 06/07/2014  Patient:  TORIA, MONTE   Account Number:  [id number]  Date Initiated:  05/31/2014  Documentation initiated by:  Elissa Hefty  Subjective/Objective Assessment:   adm w hypoxia     Action/Plan:   lives w fam, pcp dr [address]   Anticipated DC Date:  06/07/2014   Anticipated DC Plan:  [place] referral  Clinical Social Worker         [facility] Choice  Resumption Of Svcs/PTA Provider   Choice offered to / List presented to:             Status of service:  Completed, signed off Medicare Important Message given?  YES (If response is "NO", the following Medicare IM given date fields will be blank) Date Medicare IM given:  06/07/2014 Medicare IM given by:  Magdalen Spatz Date Additional Medicare IM given:   Additional Medicare IM given by:    Discharge Disposition:  [place]  Per UR Regulation:  Reviewed for med. necessity/level of care/duration of stay  If discussed at [place] of Stay Meetings, dates discussed:   06/07/2014    Comments:  06-06-14 Patient unable to transfer to home with hospice due to drip , No beds at [facility] today , patient not wanting [place] . Called [phone] DR McKreg , discussed same and GIP , MD's will discuss on rounds and call NCM back. Magdalen Spatz RN BSN   06/02/14 1320 [place] RN MSN BSN CCM Received call from [place] who is relaying a message from [facility] RN who visits pt and reports that pt's dtr and her boyfriend have moved into pt's home with their 3 small children, and that the dtr leaves the children during the day under pt's care.  RN attempted to arrange social work visit but pt refused, requested SW talk with pt while she is hospitalized as she is concerned that the stress of caregiving is detrimental to pt's health. Will request SW visit.  2/23 1515 debbie dowell rn,bsn don[facility] w [facility] called. pt  act pt of [facility]. will cont to follow.

## 2014-05-31 NOTE — Progress Notes (Signed)
OT Cancellation Note  Patient Details Name: Rachel [name] MRN: [id number] DOB: Aug 29, 1960   Cancelled Treatment:    Reason Eval/Treat Not Completed: Medical issues which prohibited therapy (Pt having difficulty keeping 02 sats up.  Will follow.)  Malka So 05/31/2014, 11:49 AM

## 2014-05-31 NOTE — Progress Notes (Signed)
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 She required NRB.   Yesterday she was diuresed with IV lasix. Weight unchanged. No output recorded.   Complains of fatigue and dyspnea.   BMET pending.    Objective:   Weight Range:  Vital Signs:   Temp:  [97.3 F (36.3 C)-99.6 F (37.6 C)] 99.6 F (37.6 C) (02/23 0409) Pulse Rate:  [99-118] 99 (02/23 0400) Resp:  [11-24] 24 (02/23 0400) BP: (96-124)/(48-86) 115/74 mmHg (02/23 0400) SpO2:  [85 %-100 %] 95 % (02/23 0400) FiO2 (%):  [100 %] 100 % (02/22 2039) Weight:  [169 lb 8 oz (76.885 kg)-171 lb 8.3 oz (77.8 kg)] 171 lb (77.565 kg) (02/23 0409) Last BM Date: 05/29/14  Weight change: Filed Weights   05/30/14 1032 05/30/14 1645 05/31/14 0409  Weight: 169 lb 8 oz (76.885 kg) 171 lb 8.3 oz (77.8 kg) 171 lb (77.565 kg)    Intake/Output:   Intake/Output Summary (Last 24 hours) at 05/31/14 0716 Last data filed at 05/31/14 0200  Gross per 24 hour  Intake 1888.33 ml  Output      0 ml  Net 1888.33 ml     Physical Exam: General: Chronically ill. Breathless when talking.  Psych: Normal affect. Neuro: Alert and oriented X 3. Moves all extremities spontaneously. HEENT: Normal Neck: No bruits. + elevated JVD at level of ear with + HJR. Lungs: bilateral diffuse crackles c/w fibrosis. Mild expiratory wheezing on 80% NRB Heart: tachy rate, reg rhythm no s3, s4, Soft 1/6 murmur at the LUSB. Abdomen: Soft, RUQ tenderness, non-distended, BS + x 4.  Extremities: No clubbing, cyanosis or edema. DP/PT/Radials 2+ and equal bilaterally.  Telemetry: ST 100s   Labs: Basic Metabolic Panel:  Recent Labs Lab 05/30/14 0950  NA 143  K 4.5  CL 101  CO2 35*  GLUCOSE 105*  BUN 15  CREATININE  1.04  CALCIUM 9.1    Liver Function Tests:  Recent Labs Lab 05/30/14 0950  AST 25  ALT 28  ALKPHOS 68  BILITOT 0.8  PROT 6.6  ALBUMIN 3.3*    Recent Labs Lab 05/30/14 0950  LIPASE 21   No results for input(s): AMMONIA in the last 168 hours.  CBC:  Recent Labs Lab 05/30/14 0950  WBC 11.5*  HGB 10.3*  HCT 34.9*  MCV 87.5  PLT 348    Cardiac Enzymes:  Recent Labs Lab 05/30/14 1014 05/30/14 1834 05/30/14 2311  TROPONINI 0.04* 0.04* 0.03    BNP: BNP (last 3 results)  Recent Labs  05/20/14 1147 05/30/14 0950  BNP 314.6* 533.5*    ProBNP (last 3 results)  Recent Labs  01/05/14 1417 02/02/14 1553 03/07/14 1036  PROBNP 549.1* 1294.0* 1560.0*      Other results:  EKG:   Imaging: Dg Chest 2 View  05/30/2014   CLINICAL DATA:  Difficulty breathing. History of COPD and hypertension  EXAM: CHEST  2 VIEW  COMPARISON:  February 03, 2014  FINDINGS: There is underlying emphysema. There is extensive interstitial fibrosis, particularly in the upper lobes. There is no frank edema or consolidation. Heart is enlarged with pulmonary vascularity within normal limits. Pacemaker lead is attached to the right ventricle. There is no demonstrable adenopathy. No bone lesions.  IMPRESSION: Emphysema with extensive interstitial fibrotic change,  stable. No frank edema or consolidation. Stable cardiomegaly.   Electronically Signed   By: Lowella Grip III M.D.   On: 05/30/2014 12:05      Medications:     Scheduled Medications: . albuterol  2.5 mg Nebulization Q4H  . albuterol  2.5 mg Nebulization TID  . antiseptic oral rinse  7 mL Mouth Rinse BID  . ARIPiprazole  2 mg Oral QODAY  . aspirin EC  81 mg Oral Daily  . budesonide-formoterol  1 puff Inhalation BID  . ceFEPime (MAXIPIME) IV  1 g Intravenous Q8H  . furosemide  60 mg Oral BID  . heparin  5,000 Units Subcutaneous 3 times per day  . pantoprazole  40 mg Oral Daily  . PARoxetine  20 mg Oral Daily  .  potassium chloride  20 mEq Oral QHS  . potassium chloride SA  40 mEq Oral Daily  . predniSONE  20 mg Oral Daily  . sildenafil  80 mg Oral TID  . sodium chloride  3 mL Intravenous Q12H  . Treprostinil  18 mcg Inhalation QID  . vancomycin  1,000 mg Intravenous Q12H     Infusions: . sodium chloride Stopped (05/30/14 2200)     PRN Medications:  acetaminophen, albuterol, ondansetron **OR** ondansetron (ZOFRAN) IV   Assessment/Plan    1. Acute on Chronic diastolic CHF with Primary RV failure- NYHA IV. Dyspneic at rest. Poor diuresis with with po lasix . Give 80 mg IV bid Lasix today.  BMET pending.  2. Acute on chronic respiratory failure - on 80% Bipap.  She is on vancomycin and cefepime for empiric HCAP coverage.  Poor prognosis with end stage lung disease, only option would appear to be transplant and not sure she would make it through this.  3. Pulmonary HTN with RV failure. Class IV. Remains on Tyvaso and sildenafil. Once she has had some diuresis, will arrange for RHC to reassess PA pressure and filling pressures.  4. Elevated Troponin- 0.04 5. Pulmonary Sarcoid- pulmonary following. Remains on prednisone.  6. Elevated WBCs- Has been prednisone.  Urine ok. Blood cultures pending.   Length of Stay: 1  CLEGG,AMY NP-C  05/31/2014, 7:16 AM  Advanced Heart Failure Team Pager [phone] (M-F; [place])  Please contact [facility] for night-coverage after hours (4p -7a ) and weekends on [email or web address]  Patient seen with NP, agree with the above note.  Patient was admitted with recurrent acute on chronic respiratory failure.  She has severe parenchymal disease from end-stage pulmonary sarcoidosis.  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  On exam, she appears volume overloaded with elevated JVP.  This appears to be more than usual for her.   - Diurese with Lasix 80 mg IV bid starting today.  - Follow  I/Os, weights closely.  - When diuresed, will need RHC.  - Long-term prognosis quite guarded, will need to discuss transplant options with Dr Lamonte Sakai but concerned she would not be strong enough for this.   [name] 05/31/2014 9:20 AM

## 2014-05-31 NOTE — Progress Notes (Signed)
After patient awoke from nap her heart rate jumped into a regular rhythm at 145-150 beat a minute. Patient's blood pressure is stable and does not complain of feeling any differently, no extreme pain, anxiety, etc. CCM notified and a 12 lead was done. Sinus rhythm noted on 12 lead. Cardiology paged and will be by to manage this. Will continue to monitor.  Sandre Kitty

## 2014-05-31 NOTE — Progress Notes (Signed)
[place] for Heparin  Indication: atrial fibrillation  Allergies  Allergen Reactions  . Latex Rash  . Nitroglycerin Other (See Comments)    Patient is on Revatio  . Other Other (See Comments)    High Dose Steroids cause chemical imbalance and altered mental status  . Meperidine Hcl Other (See Comments)    REACTION: Makes her feel funny  . Tramadol Hcl Other (See Comments)    REACTION: nausea, lightheadedness, sleepiness, and dizziness  . Ace Inhibitors Other (See Comments)    unknown  . Morphine Other (See Comments)    Loopy, light headed feeling    . Propoxyphene N-Acetaminophen Rash    Patient Measurements: Height: _0  (160 cm) Weight: 171 lb (77.565 kg) IBW/kg (Calculated) : 52.4 Heparin Dosing Weight: 69 kg   Vital Signs: Temp: 97.7 F (36.5 C) (02/23 1900) Temp Source: Oral (02/23 1900) BP: 92/65 mmHg (02/23 2300) Pulse Rate: 77 (02/23 2300)  Labs:  Recent Labs  05/30/14 0950  05/30/14 1834 05/30/14 2311 05/31/14 0713 05/31/14 2253  HGB 10.3*  --   --   --  10.5*  --   HCT 34.9*  --   --   --  36.3  --   PLT 348  --   --   --  371  --   HEPARINUNFRC  --   --   --   --   --  0.79*  CREATININE 1.04  --   --   --  1.13*  --   TROPONINI  --   < > 0.04* 0.03 0.03  --   < > = values in this interval not displayed.  Estimated Creatinine Clearance: 56.8 mL/min (by C-G formula based on Cr of 1.13).   Assessment: 54 yo female with A flutter for heparin  Goal of Therapy:  Heparin level 0.3-0.7 units/ml Monitor platelets by anticoagulation protocol: Yes   Plan:  Decrease Heparin 1050 units/hr Follow-up am labs.  Phillis Knack, PharmD, BCPS

## 2014-06-01 DIAGNOSIS — I484 Atypical atrial flutter: Secondary | ICD-10-CM

## 2014-06-01 LAB — CBC WITH DIFFERENTIAL/PLATELET
BASOS PCT: 0 % (ref 0–1)
Basophils Absolute: 0 10*3/uL (ref 0.0–0.1)
Eosinophils Absolute: 0.1 10*3/uL (ref 0.0–0.7)
Eosinophils Relative: 0 % (ref 0–5)
HEMATOCRIT: 34.7 % — AB (ref 36.0–46.0)
Hemoglobin: 10.3 g/dL — ABNORMAL LOW (ref 12.0–15.0)
Lymphocytes Relative: 11 % — ABNORMAL LOW (ref 12–46)
Lymphs Abs: 1.5 10*3/uL (ref 0.7–4.0)
MCH: 26.3 pg (ref 26.0–34.0)
MCHC: 29.7 g/dL — AB (ref 30.0–36.0)
MCV: 88.7 fL (ref 78.0–100.0)
MONO ABS: 1.5 10*3/uL — AB (ref 0.1–1.0)
Monocytes Relative: 12 % (ref 3–12)
NEUTROS PCT: 77 % (ref 43–77)
Neutro Abs: 9.9 10*3/uL — ABNORMAL HIGH (ref 1.7–7.7)
Platelets: 356 10*3/uL (ref 150–400)
RBC: 3.91 MIL/uL (ref 3.87–5.11)
RDW: 18.7 % — ABNORMAL HIGH (ref 11.5–15.5)
WBC: 13 10*3/uL — AB (ref 4.0–10.5)

## 2014-06-01 LAB — COMPREHENSIVE METABOLIC PANEL
ALBUMIN: 3.3 g/dL — AB (ref 3.5–5.2)
ALT: 93 U/L — ABNORMAL HIGH (ref 0–35)
AST: 86 U/L — ABNORMAL HIGH (ref 0–37)
Alkaline Phosphatase: 74 U/L (ref 39–117)
Anion gap: 12 (ref 5–15)
BUN: 24 mg/dL — AB (ref 6–23)
CALCIUM: 8.9 mg/dL (ref 8.4–10.5)
CO2: 35 mmol/L — ABNORMAL HIGH (ref 19–32)
CREATININE: 1.55 mg/dL — AB (ref 0.50–1.10)
Chloride: 94 mmol/L — ABNORMAL LOW (ref 96–112)
GFR calc non Af Amer: 37 mL/min — ABNORMAL LOW (ref >90)
GFR, EST AFRICAN AMERICAN: 43 mL/min — AB (ref >90)
GLUCOSE: 121 mg/dL — AB (ref 70–99)
Potassium: 4.5 mmol/L (ref 3.5–5.1)
Sodium: 141 mmol/L (ref 135–145)
Total Bilirubin: 1.1 mg/dL (ref 0.3–1.2)
Total Protein: 6.4 g/dL (ref 6.0–8.3)

## 2014-06-01 LAB — LEGIONELLA ANTIGEN, URINE

## 2014-06-01 LAB — HEMOGLOBIN A1C
Hgb A1c MFr Bld: 6.5 % — ABNORMAL HIGH (ref 4.8–5.6)
Mean Plasma Glucose: 140 mg/dL

## 2014-06-01 LAB — VANCOMYCIN, TROUGH: VANCOMYCIN TR: 22.3 ug/mL — AB (ref 10.0–20.0)

## 2014-06-01 MED ORDER — HEPARIN SODIUM (PORCINE) 5000 UNIT/ML IJ SOLN
5000.0000 [IU] | Freq: Three times a day (TID) | INTRAMUSCULAR | Status: DC
  Administered 2014-06-01 (×3): 5000 [IU] via SUBCUTANEOUS
  Filled 2014-06-01 (×4): qty 1

## 2014-06-01 MED ORDER — VANCOMYCIN HCL IN DEXTROSE 750-5 MG/150ML-% IV SOLN
750.0000 mg | Freq: Two times a day (BID) | INTRAVENOUS | Status: DC
  Administered 2014-06-02 – 2014-06-04 (×4): 750 mg via INTRAVENOUS
  Filled 2014-06-01 (×5): qty 150

## 2014-06-01 MED ORDER — SODIUM CHLORIDE 0.9 % IV SOLN: 250.0000 mL | INTRAVENOUS | Status: DC | PRN

## 2014-06-01 MED ORDER — DILTIAZEM HCL ER COATED BEADS 120 MG PO CP24
120.0000 mg | ORAL_CAPSULE | Freq: Every day | ORAL | Status: DC
  Administered 2014-06-01 – 2014-06-02 (×2): 120 mg via ORAL
  Filled 2014-06-01 (×3): qty 1

## 2014-06-01 MED ORDER — ASPIRIN 81 MG PO CHEW
81.0000 mg | CHEWABLE_TABLET | ORAL | Status: AC
  Administered 2014-06-02: 81 mg via ORAL
  Filled 2014-06-01: qty 1

## 2014-06-01 MED ORDER — DEXTROSE 5 % IV SOLN
1.0000 g | INTRAVENOUS | Status: DC
  Administered 2014-06-02: 1 g via INTRAVENOUS
  Filled 2014-06-01: qty 1

## 2014-06-01 MED ORDER — SODIUM CHLORIDE 0.9 % IJ SOLN: 3.0000 mL | Freq: Two times a day (BID) | INTRAMUSCULAR | Status: DC

## 2014-06-01 MED ORDER — SODIUM CHLORIDE 0.9 % IJ SOLN: 3.0000 mL | INTRAMUSCULAR | Status: DC | PRN

## 2014-06-01 NOTE — Progress Notes (Addendum)
ANTIBIOTIC CONSULT NOTE - FOLLOW UP  Pharmacy Consult for Vancomycin/Cefepime Indication: pneumonia  Allergies  Allergen Reactions  . Latex Rash  . Nitroglycerin Other (See Comments)    Patient is on Revatio  . Other Other (See Comments)    High Dose Steroids cause chemical imbalance and altered mental status  . Meperidine Hcl Other (See Comments)    REACTION: Makes her feel funny  . Tramadol Hcl Other (See Comments)    REACTION: nausea, lightheadedness, sleepiness, and dizziness  . Ace Inhibitors Other (See Comments)    unknown  . Morphine Other (See Comments)    Loopy, light headed feeling    . Propoxyphene N-Acetaminophen Rash    Patient Measurements: Height: _0  (160 cm) Weight: 171 lb 8.3 oz (77.8 kg) IBW/kg (Calculated) : 52.4 Adjusted Body Weight: n/a  Vital Signs: Temp: 98.3 F (36.8 C) (02/24 0700) Temp Source: Oral (02/24 0700) BP: 128/76 mmHg (02/24 0900) Pulse Rate: 84 (02/24 0900) Intake/Output from previous day: 02/23 0701 - 02/24 0700 In: 1401.6 [P.O.:480; I.V.:421.6; IV Piggyback:500] Out: 3500 [Urine:2900; Emesis/NG output:600] Intake/Output from this shift:    Labs:  Recent Labs  05/30/14 0950 05/31/14 0713 06/01/14 0700  WBC 11.5* 11.5* 13.0*  HGB 10.3* 10.5* 10.3*  PLT 348 371 356  CREATININE 1.04 1.13* 1.55*   Estimated Creatinine Clearance: 41.5 mL/min (by C-G formula based on Cr of 1.55). No results for input(s): VANCOTROUGH, VANCOPEAK, VANCORANDOM, GENTTROUGH, GENTPEAK, GENTRANDOM, TOBRATROUGH, TOBRAPEAK, TOBRARND, AMIKACINPEAK, AMIKACINTROU, AMIKACIN in the last 72 hours.   Microbiology:   Anti-infectives    Start     Dose/Rate Route Frequency Ordered Stop   05/30/14 2000  vancomycin (VANCOCIN) IVPB 1000 mg/200 mL premix     1,000 mg 200 mL/hr over 60 Minutes Intravenous Every 12 hours 05/30/14 1805     05/30/14 1800  ceFEPIme (MAXIPIME) 1 g in dextrose 5 % 50 mL IVPB     1 g 100 mL/hr over 30 Minutes Intravenous Every 8  hours 05/30/14 1708 06/07/14 1759      Assessment: 54 yo female with hx stave IV sarcoidosis, pulmonary HTN and diastolic/RV failure on vancomycin and cefepime empirically for PNA.  Scr up a little today to 1.55, est CrCl ~ 41 ml/min.  Cultures negative so far.  2/22 vanc > 2/22 cefepime >  2/22 Resp virus> pending 2/22 BCx x 2 > ngtd 2/22 UCx > 30K/ml  Goal of Therapy:  Vancomycin trough level 15-20 mcg/ml  Plan:  1. Will decrease cefepime to 1g q 24 hrs. 2. Continue vancomycin 1g IV q 12 hrs for now, will check trough level before tonight's dose. 3. F/u cultures.  Uvaldo Rising, BCPS  Clinical Pharmacist Pager [phone]  06/01/2014 9:28 AM   Addendum: Vancomycin trough 22.3, slightly above goal, drawn appropriately. RN already gave PM dose at 2000. Scr trending up.  Plan:  Decrease dose to 750 mg IV Q 12 hrs, next dose tomorrow at 1400. Continue monitor renal function closely.  Recheck level at steady state if indicated.  Maryanna Shape, PharmD, BCPS  Clinical Pharmacist  Pager: [phone]

## 2014-06-01 NOTE — Progress Notes (Addendum)
Patient ID: Rachel [name], female   DOB: [birth year], 54 y.o.   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 She required NRB.   She diuresed well yesterday, net negative 2 L.  Yesterday during day she developed SVT, probably atypical atrial flutter, rate 150s.  This eventually converted back to NSR on amiodarone.   Breathing is improving but she is nauseated.    BMET pending.    Objective:   Weight Range:  Vital Signs:   Temp:  [97.6 F (36.4 C)-98.8 F (37.1 C)] 97.9 F (36.6 C) (02/24 0400) Pulse Rate:  [77-147] 88 (02/24 0700) Resp:  [12-25] 17 (02/24 0700) BP: (67-126)/(46-89) 106/75 mmHg (02/24 0700) SpO2:  [93 %-100 %] 93 % (02/24 0700) FiO2 (%):  [100 %] 100 % (02/23 1900) Weight:  [171 lb 8.3 oz (77.8 kg)] 171 lb 8.3 oz (77.8 kg) (02/24 0400) Last BM Date: 05/29/14  Weight change: Filed Weights   05/30/14 1645 05/31/14 0409 06/01/14 0400  Weight: 171 lb 8.3 oz (77.8 kg) 171 lb (77.565 kg) 171 lb 8.3 oz (77.8 kg)    Intake/Output:   Intake/Output Summary (Last 24 hours) at 06/01/14 0731 Last data filed at 06/01/14 0600  Gross per 24 hour  Intake 1401.62 ml  Output   3500 ml  Net -2098.38 ml     Physical Exam: General: Chronically ill. Breathless when talking.  Psych: Normal affect. Neuro: Alert and oriented X 3. Moves all extremities spontaneously. HEENT: Normal Neck: No bruits. + elevated JVD at level of ear with + HJR. Lungs: bilateral diffuse crackles c/w fibrosis. Mild expiratory wheezing on 80% NRB Heart: tachy rate, reg rhythm no s3, s4, Soft 1/6 murmur at the LUSB. Abdomen: Soft, RUQ tenderness, non-distended, BS + x 4.  Extremities: No clubbing, cyanosis or edema. DP/PT/Radials 2+ and equal  bilaterally.  Telemetry: ST 100s   Labs: Basic Metabolic Panel:  Recent Labs Lab 05/30/14 0950 05/31/14 0713 05/31/14 1507  NA 143 141  --   K 4.5 5.1 5.1  CL 101 103  --   CO2 35* 32  --   GLUCOSE 105* 130*  --   BUN 15 22  --   CREATININE 1.04 1.13*  --   CALCIUM 9.1 8.8  --   MG  --   --  2.1    Liver Function Tests:  Recent Labs Lab 05/30/14 0950 05/31/14 0713  AST 25 44*  ALT 28 48*  ALKPHOS 68 65  BILITOT 0.8 1.6*  PROT 6.6 6.5  ALBUMIN 3.3* 3.3*    Recent Labs Lab 05/30/14 0950  LIPASE 21   No results for input(s): AMMONIA in the last 168 hours.  CBC:  Recent Labs Lab 05/30/14 0950 05/31/14 0713  WBC 11.5* 11.5*  HGB 10.3* 10.5*  HCT 34.9* 36.3  MCV 87.5 91.4  PLT 348 371    Cardiac Enzymes:  Recent Labs Lab 05/30/14 1014 05/30/14 1834 05/30/14 2311 05/31/14 0713  TROPONINI 0.04* 0.04* 0.03 0.03    BNP: BNP (last 3 results)  Recent Labs  05/20/14 1147 05/30/14 0950  BNP 314.6* 533.5*    ProBNP (last 3 results)  Recent Labs  01/05/14 1417 02/02/14 1553 03/07/14 1036  PROBNP 549.1* 1294.0* 1560.0*      Other results:  EKG:   Imaging: Dg Chest 2 View  05/30/2014   CLINICAL DATA:  Difficulty breathing. History of COPD and hypertension  EXAM: CHEST  2 VIEW  COMPARISON:  February 03, 2014  FINDINGS: There is underlying emphysema. There is extensive interstitial fibrosis, particularly in the upper lobes. There is no frank edema or consolidation. Heart is enlarged with pulmonary vascularity within normal limits. Pacemaker lead is attached to the right ventricle. There is no demonstrable adenopathy. No bone lesions.  IMPRESSION: Emphysema with extensive interstitial fibrotic change, stable. No frank edema or consolidation. Stable cardiomegaly.   Electronically Signed   By: Lowella Grip III M.D.   On: 05/30/2014 12:05     Medications:     Scheduled Medications: . albuterol  2.5 mg Nebulization Q4H  . antiseptic  oral rinse  7 mL Mouth Rinse BID  . ARIPiprazole  2 mg Oral QODAY  . aspirin EC  81 mg Oral Daily  . budesonide-formoterol  1 puff Inhalation BID  . ceFEPime (MAXIPIME) IV  1 g Intravenous Q8H  . furosemide  80 mg Intravenous BID  . pantoprazole  40 mg Oral Daily  . PARoxetine  20 mg Oral Daily  . predniSONE  20 mg Oral Daily  . sildenafil  80 mg Oral TID  . sodium chloride  3 mL Intravenous Q12H  . Treprostinil  18 mcg Inhalation QID  . vancomycin  1,000 mg Intravenous Q12H    Infusions: . amiodarone Stopped (06/01/14 0240)  . heparin 1,050 Units/hr (06/01/14 0000)    PRN Medications: acetaminophen, albuterol, ondansetron **OR** ondansetron (ZOFRAN) IV   Assessment/Plan    1. Acute on chronic diastolic CHF with primarily RV failure: She diuresed well yesterday, breathing has improved some.  JVP remains elevated.    BMET pending.  - Continue current Lasix 80 mg IV bid today.  - RHC tomorrow morning.  2. Acute on chronic respiratory failure: On NRBM.  She has severe parenchymal disease from end-stage pulmonary sarcoidosis.  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 She is on vancomycin and cefepime for empiric HCAP coverage.  Poor prognosis with end stage lung disease, only option would appear to be transplant and not sure she would make it through this.  3. PAH:  Probably primarily group 3 from parenchymal lung disease (sarcoidosis).  She has been on Revatio and Tyvaso long-term.  Plan RHC tomorrow, consideration will be given to initiation of endothelin receptor antagonist.   4. Pulmonary Sarcoid: Pulmonary following. Remains on prednisone.  5. SVT: Suspect this was atypical atrial flutter.  This converted to NSR on amiodarone.  I will stop this today, not good long-term amio candidate with severe lung disease.  I am also going to put her back on Fabens heparin.  Will add diltiazem CD 120 mg daily.    Length of Stay: 2  [name]  06/01/2014, 7:31 AM  Creatinine up to 1.55.  Got Lasix this morning, will hold further doses and reassess by RHC tomorrow morning.   [name] 06/01/2014 9:13 AM

## 2014-06-01 NOTE — Progress Notes (Signed)
Family Medicine Teaching Service Daily Progress Note Intern Pager: [phone]  Patient name: Rachel [name] Medical record number: [id number] Date of birth: 1960-11-04 Age: 53 y.o. Gender: female  Primary Care Provider: Christa See, MD Consultants: Cardiology, Pulmonology Code Status: Full Code (addressed this admission)  Pt Overview and Major Events to Date:  2/22-2/23: Admitted with acute on chronic respiratory failure, acute infection superimposed on [facility] with sarcoidosis IV and acute on chronic CHF with Cor Pulmonale.    Assessment and Plan: Rachel [name] is a 54 y.o. female presenting with worsening shortness of breath, increase oxygen requirement, and acute resp illness with diarrhea.Acute on chronic respiratory failure in the setting of infection with PAH, Sarcoidosis IV, and acute on chronic CHF with Cor pulmonale. PMH is significant for severe pHTN, pulmonary sarcoidosis, RV failure, HTN, Anxiety, cardiomyopathy, NSVT and Psychosis.  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 She was referred to [facility] in the past for lung transplant evaluation. Pt admits to cough and increase in sputum production for at least a week. Pts office allergy list includes doxy and high dose steroids (psychosis). Chest X-ray with Emphysema with extensive interstitial fibrotic change, stable. No frank edema or consolidation. Stable cardiomegaly. Lactic acid 1.21, WBC 11.5. BNP 533.5 (314- 10 days ago). - Following pulm and cards recs.  - Continue to treat HCAP with cefepime / Vanc (day 3/5) - Legionella / RVP pending.  - Sputum culture inadequate.   - Blood Cx NGTD.  - Strep pneumo urine negative. Influenza negative. HIV negative. TSH normal.  - Continue workup for lung tx per pulm and cards.  - Pt currently on 100% (12 L) non-re breather with saturations 93-100%  - Continue PAH medications: Revatio 80 mg TID/Tyvaso 18 mcg QID -  Will keep steroids at 20 mg for now, Psychosis with high dose steroids per chart, will defer to pulm for increase.  - albuterol PRN  Cardiomyopathy: last Echo (9/15) with EF 55-60%, moderately dilated RV with mildly decreased systolic function, PA systolic pressure 54. SVT with concern for atrial flutter yesterday. Started on amiodarone drip after attempted conversion with adenosine. Converted with amiodarone, but lost IV access overnight. Cards ok to hold amiodarone given lost access. NSR overnight. Documented HR 70's - 80's.  - Will f/u need for continuation of amiodarone. Would like to avoid this if at all possible given risk with her other medications and pulmonary disease. Will defer to cards.  - Right Heart Cath for 2/25.  - Trop mildly elevated >> demand ischemia per cards.  - Elevated BNP 533.  - Following cards recs as below.  - TSH normal.  - EKG: ST, Atrial premature complexes, Borderline right axis deviation - EKG PRN for chest pain, and in am  Pulmonary hypertension with RV failure: last Echo (9/15) with EF 55-60%, moderately dilated RV with mildly decreased systolic function, PA systolic pressure 54. Pt scheduled for cath 2/25 with Dr. Aundra Dubin - Cardiology consulted. Cath on 2/25 per Dr. Aundra Dubin as part of workup for tx.  - Per pulm may need heart / lung txp.  - Diuresis per cards / pulm. Following closely given preload dependence.  - Follow weights and I/O's. Out nearly 3L overnight. Weight up around 3 lbs. From baseline.   - Cr up yesterday. Continue to monitor closely.  - Continued lasix 80 mg BID for now, will defer to cards for increase with BNP of 533 today and edema.  -  Continued Revatio, Tyvaso  Congestive Hepatopathy - Pt. Here with some mild TTP of RUQ, mild transaminitis and tbili 1.6 yesterday. This in the setting of known RHF. JVP on exam, but difficult to evaluate hepatojugular reflux.  - Continue to trend CMET - Monitor closely  - Serial exams.  -  Diuresing. Monitor closely with preload dependence.   AKI -  Pt. With jump in Scr from 1.04 to 1.13 yesterday in the setting of RHF and poor output. Likely prerenal and cardiorenal related, but also giving lasix for diuresis.  - Continue to trend Scr - Monitor closely in the face of diuresis and tenuous cardiopulmonary status.   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 Given end stage lung disease and normalization of LV function, the device was not replaced at ERI and tachy therapies were turned off.  - SVT with concern for atrial flutter yesterday.  - S/P adenosine to no avail. Amiodarone started with conversion and  Rate control, but lost the IV overnight. Amiodarone ok to stop per cards.  - Currently NSR with rate 70's - 80's.  - monitor closely.   Anxiety/Depression: Paxil 20 mg daily.  GERD: Continue PPI  FEN/GI: sips/chips meds, will hold on MIVF considering edema and increase in BNP Prophylaxis: Hep sq, PPI  Disposition: Pending RHC and continued cards / pulm recs.   Subjective:  Continues to feel bad. Does not feel overtly fluid overloaded this am. She says that she feels better with the nonrebreather. Diuresing well since yesterday.   Objective: Temp:  [97.6 F (36.4 C)-98.3 F (36.8 C)] 98.1 F (36.7 C) (02/24 1200) Pulse Rate:  [77-96] 83 (02/24 1100) Resp:  [12-25] 15 (02/24 1200) BP: (67-128)/(46-89) 108/64 mmHg (02/24 1200) SpO2:  [90 %-100 %] 100 % (02/24 1316) FiO2 (%):  [100 %] 100 % (02/24 1316) Weight:  [171 lb 8.3 oz (77.8 kg)] 171 lb 8.3 oz (77.8 kg) (02/24 0400) Physical Exam: General: NAD, Resting quietly.  Cardiovascular: RRR, RSB systolic ejection murmur 2/6, Right parasternal heave.  No gallops, no rubs. 2+ distal pulses, mild JVP.  Respiratory: Crackles throughout, no wheezes, or rales. Appropriate rate, mildly labored with nonrebreather in place 12L  Abdomen: S, Mild TTP over RUQ, Liver edge smooth, just below costal margin, +BS, No  organomegaly.  Extremities: WWP, Trace lower extremity edema.   Laboratory:  Recent Labs Lab 05/30/14 0950 05/31/14 0713 06/01/14 0700  WBC 11.5* 11.5* 13.0*  HGB 10.3* 10.5* 10.3*  HCT 34.9* 36.3 34.7*  PLT 348 371 356    Recent Labs Lab 05/30/14 0950 05/31/14 0713 05/31/14 1507 06/01/14 0700  NA 143 141  --  141  K 4.5 5.1 5.1 4.5  CL 101 103  --  94*  CO2 35* 32  --  35*  BUN 15 22  --  24*  CREATININE 1.04 1.13*  --  1.55*  CALCIUM 9.1 8.8  --  8.9  PROT 6.6 6.5  --  6.4  BILITOT 0.8 1.6*  --  1.1  ALKPHOS 68 65  --  74  ALT 28 48*  --  93*  AST 25 44*  --  86*  GLUCOSE 105* 130*  --  121*    RVP - pending Legionella - pending Strep pneumo urine - negative.   Trop - 0.04 > 0.03 TSH - 1.048 Lactic Acid - 1.21   Imaging/Diagnostic Tests:  CXR 2/22 - IMPRESSION: Emphysema with extensive interstitial fibrotic change, stable. No frank edema or consolidation. Stable cardiomegaly.  Aquilla Hacker, MD 06/01/2014, 2:05 PM PGY-1, [place] Intern pager: [phone], text pages welcome

## 2014-06-01 NOTE — Progress Notes (Signed)
OT Cancellation Note  Patient Details Name: Rachel [name] MRN: [id number] DOB: 06/06/1960   Cancelled Treatment:    Reason Eval/Treat Not Completed: Medical issues which prohibited therapy. Pt with nausea and vomiting last night and this morning.  Politely declined OOB activity.  Remains on NRB. Will continue to follow.  Malka So 06/01/2014, 9:47 AM  [phone]

## 2014-06-01 NOTE — Progress Notes (Signed)
PT Cancellation Note  Patient Details Name: Rachel [name] MRN: [id number] DOB: 29-May-1960   Cancelled Treatment:      Reason Eval/Treat Not Completed: Medical issues which prohibited therapy. Pt with nausea and vomiting last night and this morning. Politely declined OOB activity. Remains on NRB. Will continue to follow.   Duncan Dull 06/01/2014, 9:54 AM  Alben Deeds, PT DPT  [phone]

## 2014-06-01 NOTE — Progress Notes (Signed)
Entire 0200 Dose of Maxipime not given. ABX started and ran for ~60mns. IV access lost. Not compatible with Heparin gtt. Unable to again new IV access.

## 2014-06-01 NOTE — Progress Notes (Signed)
Pt lost IV access for amio gtt. Pt has second IV site for heparin gtt. Amio and Heparin incompatible. This RN unable to locate vein to restart IV. IV team consulted. Two IV team RN's assessed patient with [place] and were unsuccessful. Veins in [facility] too close to artery and nerves. PICC placement recommended. Family medicine resident paged. Agreed that pt needed more access. CCM called. Verbal orders from Dr. Alva Garnet to hold amio gtt until AM when pt can be evaluated by rounding teams. Pt has been in NSR all night with HR 70-90s. Will continue to monitor and assess pt closely.

## 2014-06-02 ENCOUNTER — Encounter (HOSPITAL_COMMUNITY): Payer: Self-pay | Admitting: Cardiology

## 2014-06-02 ENCOUNTER — Encounter (HOSPITAL_COMMUNITY)
Admission: EM | Disposition: A | Discharge status: Hospice | Payer: Self-pay | Source: Home / Self Care | Attending: Family Medicine

## 2014-06-02 ENCOUNTER — Ambulatory Visit (HOSPITAL_COMMUNITY): Admission: RE | Admit: 2014-06-02 | Payer: Medicare Other | Source: Ambulatory Visit | Admitting: Cardiology

## 2014-06-02 ENCOUNTER — Other Ambulatory Visit: Payer: Self-pay

## 2014-06-02 DIAGNOSIS — I270 Primary pulmonary hypertension: Secondary | ICD-10-CM

## 2014-06-02 DIAGNOSIS — I2609 Other pulmonary embolism with acute cor pulmonale: Secondary | ICD-10-CM

## 2014-06-02 HISTORY — PX: RIGHT HEART CATHETERIZATION: SHX5447

## 2014-06-02 LAB — COMPREHENSIVE METABOLIC PANEL
ALT: 127 U/L — ABNORMAL HIGH (ref 0–35)
ANION GAP: 4 — AB (ref 5–15)
AST: 86 U/L — AB (ref 0–37)
Albumin: 3.2 g/dL — ABNORMAL LOW (ref 3.5–5.2)
Alkaline Phosphatase: 65 U/L (ref 39–117)
BUN: 21 mg/dL (ref 6–23)
CHLORIDE: 92 mmol/L — AB (ref 96–112)
CO2: 41 mmol/L (ref 19–32)
CREATININE: 1.11 mg/dL — AB (ref 0.50–1.10)
Calcium: 8.6 mg/dL (ref 8.4–10.5)
GFR calc Af Amer: 65 mL/min — ABNORMAL LOW (ref >90)
GFR calc non Af Amer: 56 mL/min — ABNORMAL LOW (ref >90)
Glucose, Bld: 128 mg/dL — ABNORMAL HIGH (ref 70–99)
Potassium: 4 mmol/L (ref 3.5–5.1)
Sodium: 137 mmol/L (ref 135–145)
TOTAL PROTEIN: 6.2 g/dL (ref 6.0–8.3)
Total Bilirubin: 0.9 mg/dL (ref 0.3–1.2)

## 2014-06-02 LAB — CBC
HCT: 35.3 % — ABNORMAL LOW (ref 36.0–46.0)
HEMATOCRIT: 33.5 % — AB (ref 36.0–46.0)
Hemoglobin: 9.6 g/dL — ABNORMAL LOW (ref 12.0–15.0)
Hemoglobin: 10.4 g/dL — ABNORMAL LOW (ref 12.0–15.0)
MCH: 25.5 pg — AB (ref 26.0–34.0)
MCH: 26.7 pg (ref 26.0–34.0)
MCHC: 28.7 g/dL — ABNORMAL LOW (ref 30.0–36.0)
MCHC: 29.5 g/dL — ABNORMAL LOW (ref 30.0–36.0)
MCV: 88.9 fL (ref 78.0–100.0)
MCV: 90.5 fL (ref 78.0–100.0)
PLATELETS: 333 10*3/uL (ref 150–400)
Platelets: 317 10*3/uL (ref 150–400)
RBC: 3.77 MIL/uL — ABNORMAL LOW (ref 3.87–5.11)
RBC: 3.9 MIL/uL (ref 3.87–5.11)
RDW: 18.5 % — ABNORMAL HIGH (ref 11.5–15.5)
RDW: 18.8 % — ABNORMAL HIGH (ref 11.5–15.5)
WBC: 11.1 10*3/uL — AB (ref 4.0–10.5)
WBC: 11.1 10*3/uL — ABNORMAL HIGH (ref 4.0–10.5)

## 2014-06-02 LAB — RESPIRATORY VIRUS PANEL
Adenovirus: NEGATIVE
INFLUENZA B 1: NEGATIVE
Influenza A: NEGATIVE
METAPNEUMOVIRUS: NEGATIVE
Parainfluenza 1: NEGATIVE
Parainfluenza 2: NEGATIVE
Parainfluenza 3: NEGATIVE
RESPIRATORY SYNCYTIAL VIRUS A: NEGATIVE
RHINOVIRUS: NEGATIVE
Respiratory Syncytial Virus B: NEGATIVE

## 2014-06-02 LAB — BASIC METABOLIC PANEL
Anion gap: 11 (ref 5–15)
BUN: 21 mg/dL (ref 6–23)
CHLORIDE: 91 mmol/L — AB (ref 96–112)
CO2: 38 mmol/L — ABNORMAL HIGH (ref 19–32)
CREATININE: 1.09 mg/dL (ref 0.50–1.10)
Calcium: 9.2 mg/dL (ref 8.4–10.5)
GFR calc Af Amer: 66 mL/min — ABNORMAL LOW (ref >90)
GFR, EST NON AFRICAN AMERICAN: 57 mL/min — AB (ref >90)
Glucose, Bld: 123 mg/dL — ABNORMAL HIGH (ref 70–99)
Potassium: 4.1 mmol/L (ref 3.5–5.1)
Sodium: 140 mmol/L (ref 135–145)

## 2014-06-02 LAB — PROTIME-INR
INR: 1.22 (ref 0.00–1.49)
Prothrombin Time: 15.5 seconds — ABNORMAL HIGH (ref 11.6–15.2)

## 2014-06-02 SURGERY — RIGHT HEART CATH
Anesthesia: LOCAL

## 2014-06-02 MED ORDER — MIDAZOLAM HCL 2 MG/2ML IJ SOLN
INTRAMUSCULAR | Status: AC
  Filled 2014-06-02: qty 2

## 2014-06-02 MED ORDER — NITROGLYCERIN 1 MG/10 ML FOR IR/CATH LAB
INTRA_ARTERIAL | Status: AC
  Filled 2014-06-02: qty 10

## 2014-06-02 MED ORDER — HEPARIN (PORCINE) IN NACL 2-0.9 UNIT/ML-% IJ SOLN
INTRAMUSCULAR | Status: AC
  Filled 2014-06-02: qty 1000

## 2014-06-02 MED ORDER — HEPARIN SODIUM (PORCINE) 5000 UNIT/ML IJ SOLN
5000.0000 [IU] | Freq: Three times a day (TID) | INTRAMUSCULAR | Status: DC
  Administered 2014-06-02 – 2014-06-04 (×6): 5000 [IU] via SUBCUTANEOUS
  Filled 2014-06-02 (×6): qty 1

## 2014-06-02 MED ORDER — FENTANYL CITRATE 0.05 MG/ML IJ SOLN
INTRAMUSCULAR | Status: AC
  Filled 2014-06-02: qty 2

## 2014-06-02 MED ORDER — FUROSEMIDE 10 MG/ML IJ SOLN
80.0000 mg | Freq: Two times a day (BID) | INTRAMUSCULAR | Status: DC
  Administered 2014-06-02 – 2014-06-07 (×9): 80 mg via INTRAVENOUS
  Filled 2014-06-02 (×11): qty 8

## 2014-06-02 MED ORDER — AMIODARONE HCL 150 MG/3ML IV SOLN
INTRAVENOUS | Status: AC
  Filled 2014-06-02: qty 3

## 2014-06-02 MED ORDER — DEXTROSE 5 % IV SOLN
1.0000 g | Freq: Three times a day (TID) | INTRAVENOUS | Status: DC
  Administered 2014-06-02 – 2014-06-04 (×5): 1 g via INTRAVENOUS
  Filled 2014-06-02 (×7): qty 1

## 2014-06-02 MED ORDER — ONDANSETRON HCL 4 MG/2ML IJ SOLN
4.0000 mg | Freq: Four times a day (QID) | INTRAMUSCULAR | Status: DC | PRN
  Administered 2014-06-06: 4 mg via INTRAVENOUS
  Filled 2014-06-02: qty 2

## 2014-06-02 MED ORDER — ACETAMINOPHEN 325 MG PO TABS: 650.0000 mg | ORAL_TABLET | ORAL | Status: DC | PRN

## 2014-06-02 MED ORDER — LIDOCAINE HCL (PF) 1 % IJ SOLN
INTRAMUSCULAR | Status: AC
  Filled 2014-06-02: qty 30

## 2014-06-02 MED ORDER — FENTANYL CITRATE 0.05 MG/ML IJ SOLN: 25.0000 ug | Freq: Once | INTRAMUSCULAR | Status: DC

## 2014-06-02 MED ORDER — AMBRISENTAN 5 MG PO TABS
5.0000 mg | ORAL_TABLET | Freq: Every day | ORAL | Status: DC
  Administered 2014-06-02 – 2014-06-06 (×4): 5 mg via ORAL
  Filled 2014-06-02 (×5): qty 1

## 2014-06-02 NOTE — Progress Notes (Signed)
Family Medicine Teaching Service Daily Progress Note Intern Pager: [phone]  Patient name: Rachel [name] Medical record number: [id number] Date of birth: 1961-03-28 Age: 54 y.o. Gender: female  Primary Care Provider: Christa See, MD Consultants: Cardiology, Pulmonology Code Status: Full Code (addressed this admission)  Pt Overview and Major Events to Date:  2/22-2/23: Admitted with acute on chronic respiratory failure, acute infection superimposed on [place] with sarcoidosis IV and acute on chronic CHF with Cor Pulmonale.   2/24-2/25: Following Cr. Attempting to diurese. Heart Cath today with worsening RH F.  Assessment and Plan: Rachel [name] is a 54 y.o. female presenting with worsening shortness of breath, increase oxygen requirement, and acute resp illness with diarrhea.Acute on chronic respiratory failure in the setting of infection with PAH, Sarcoidosis IV, and acute on chronic CHF with Cor pulmonale. PMH is significant for severe pHTN, pulmonary sarcoidosis, RV failure, HTN, Anxiety, cardiomyopathy, NSVT and Psychosis.  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 She was referred to [facility] in the past for lung transplant evaluation. Cough and increase in sputum production for at least a week prior to admission. Pts office allergy list includes doxy and high dose steroids (psychosis). Chest X-ray with Emphysema with extensive interstitial fibrotic change, stable. No frank edema or consolidation. Stable cardiomegaly. - Following pulm and cards recs.  - RHC with Severe pulm AHTN PA - 54 mmhg, but PCWP 15.  - Continue to treat HCAP with cefepime / Vanc (day 4/5) - Sputum culture inadequate.   - Blood Cx NGTD.  - Strep pneumo urine negative. Influenza negative. HIV negative. TSH normal. RVP neg, Legionella neg.  - Continue workup for lung tx per pulm and cards.  - On 100% (6 L) non-re breather with saturations  93-100%  - Continue PAH medications: Revatio 80 mg TID/Tyvaso 18 mcg QID - Will keep steroids at 20 mg for now, Psychosis with high dose steroids per chart, will defer to pulm for increase.  - albuterol PRN  Cardiomyopathy: last Echo (9/15) with EF 55-60%, moderately dilated RV with mildly decreased systolic function, PA systolic pressure 54. Rate controlled with Diltiazem after SVT / atrial flutter.  - Diltiazem 163m daily.  - Right Heart Cath for 2/25. > Severe PAHTN, Near Nl. PCWP. Primary RV failure.  - Adding Letairis.  - Trop mildly elevated >> demand ischemia per cards.  - BNP 533.  - Following cards recs.  - TSH normal.  - Diuresing with 839mof lasix BID. Following Cr closely back down to 1.09 from 1.55 today.  - Out 2L yesterday. Weight down 3 lbs.  - EKG: ST, Atrial premature complexes, Borderline right axis deviation - EKG PRN for chest pain, and in am  Pulmonary hypertension with RV failure: last Echo (9/15) with EF 55-60%, moderately dilated RV with mildly decreased systolic function, PA systolic pressure 54. Pt scheduled for cath 2/25 with Dr. McAundra Dubin Cardiology [place]. Severe PAHTN and RV failure.  - Per pulm may need heart / lung txp.  - Diuresis per cards / pulm. Following blood pressures closely given preload dependence.  - Continued Revatio, Tyvaso  Congestive Hepatopathy - Pt. Here with some mild TTP of RUQ, mild transaminitis and tbili 1.6 yesterday. Amiodarone could also contribute to transaminitis, now DC'd. Following LFT's. This in the setting of known RHF. JVP on exam, but difficult to evaluate hepatojugular reflux.  - CMET continues to increase. Exam remains mildly to  moderately TTP.  - Continue to suspect injury primarily due to RHF and congestion especially with findings in recent [place].  - Medical management for RHF primary goal.  - Will get Hep panel, TSH normal, Consider RUQ u/s with doppler studies if continues to worsen.   - Continue  diuresis as above, but careful not to overdiurese.  - Continue to trend CMET - Monitor closely  - Serial exams.  - Diuresing. Monitor closely with preload dependence.   AKI -  Pt. With jump in Scr from 1.04 to 1.55 in the setting of RHF and poor output. Likely prerenal and cardiorenal related, but also giving lasix for diuresis.  - Scr back to baseline. Monitor closely.  - Continue to trend Scr - Monitor closely in the face of diuresis and tenuous cardiopulmonary status.   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 Given end stage lung disease and normalization of LV function, the device was not replaced at ERI and tachy therapies were turned off.  - Intermittent SVT. Rate controlled. Diltiazem added.   - S/P adenosine to no avail. Amiodarone started with conversion and  Rate control, but lost the IV overnight. Amiodarone ok to stop per cards. Diltiazem as above.  - Currently NSR   Anxiety/Depression: Paxil 20 mg daily. Abilify 61m qod.   GERD: Continue PPI  FEN/GI: sips/chips meds, will hold on MIVF considering edema and increase in BNP Prophylaxis: Hep sq, PPI  Disposition: Pending Cards and Pulm Recs regarding Txp. Will get SW consult for home situation.    Subjective:  No acute events overnight. Pt. Back from cath this afternoon. States that her chest discomfort has improved since the procedure. She is hungry, and would like some broth. She otherwise appears to be very down emotionally, but did not want to speak any further about what is going on. Daughter in room. No other complaints at this time.   Objective: Temp:  [98 F (36.7 C)-98.9 F (37.2 C)] 98.9 F (37.2 C) (02/25 1200) Pulse Rate:  [77-94] 94 (02/25 1200) Resp:  [13-26] 26 (02/25 1200) BP: (93-113)/(47-71) 103/65 mmHg (02/25 1200) SpO2:  [85 %-100 %] 94 % (02/25 1200) FiO2 (%):  [100 %] 100 % (02/25 1200) Weight:  [165 lb 9.1 oz (75.1 kg)] 165 lb 9.1 oz (75.1 kg) (02/25 0500) Physical Exam: General: NAD,  Sitting up in bed.  Cardiovascular: RRR, RSB systolic ejection murmur 2/6, Right parasternal heave.  No gallops, no rubs. 2+ distal pulses, mild JVP.  Respiratory: Crackles throughout, no wheezes, or rales. Appropriate rate, mildly labored with nonrebreather in place 12L  Abdomen: S, Mild TTP over RUQ, Liver edge smooth, just below costal margin, +BS, No organomegaly.  Extremities: WWP, Trace lower extremity edema.   Laboratory:  Recent Labs Lab 06/01/14 0700 06/02/14 0305 06/02/14 1000  WBC 13.0* 11.1* 11.1*  HGB 10.3* 9.6* 10.4*  HCT 34.7* 33.5* 35.3*  PLT 356 333 317    Recent Labs Lab 05/31/14 0713  06/01/14 0700 06/02/14 0305 06/02/14 1000  NA 141  --  141 140 137  K 5.1  < > 4.5 4.1 4.0  CL 103  --  94* 91* 92*  CO2 32  --  35* 38* 41*  BUN 22  --  24* 21 21  CREATININE 1.13*  --  1.55* 1.09 1.11*  CALCIUM 8.8  --  8.9 9.2 8.6  PROT 6.5  --  6.4  --  6.2  BILITOT 1.6*  --  1.1  --  0.9  ALKPHOS 65  --  74  --  65  ALT 48*  --  93*  --  127*  AST 44*  --  86*  --  86*  GLUCOSE 130*  --  121* 123* 128*  < > = values in this interval not displayed.  RVP - pending Legionella - pending Strep pneumo urine - negative.   Trop - 0.04 > 0.03 TSH - 1.048 Lactic Acid - 1.21   Imaging/Diagnostic Tests:  CXR 2/22 - IMPRESSION: Emphysema with extensive interstitial fibrotic change, stable. No frank edema or consolidation. Stable cardiomegaly.   Aquilla Hacker, MD 06/02/2014, 2:36 PM PGY-1, [place] Intern pager: [phone], text pages welcome

## 2014-06-02 NOTE — Progress Notes (Addendum)
Site area: rt groin, rt fv sheath Site Prior to Removal:  Level 0 Pressure Applied For: 10 minutes Manual:   yes Patient Status During Pull:  stable Post Pull Site:  Level 0 Post Pull Instructions Given:  yes Post Pull Pulses Present: yes Dressing Applied:  tegaderm Bedrest begins @ [id number] Comments: no complications. States chest discomfort is less.

## 2014-06-02 NOTE — Progress Notes (Signed)
Dr. Aundra Dubin by to see patient-aware of 10/10 chest discomfort. Fentanyl 53mg IV given per order[facility]

## 2014-06-02 NOTE — Progress Notes (Signed)
Breathing treatment stopped due to drop in sats to the 70's. RN aware and NRB placed back on patient.

## 2014-06-02 NOTE — Progress Notes (Signed)
Pt went into a tachy HR in 130's. Pt said she had some discomfort in her chest but no pain. EKG was done and on-call Cardiologist paged. About 10 or so minutes after event started pt converted back to NSR on her own. Cards returned page soon after she converted and was updated on the situation. Will continue to monitor at this time.

## 2014-06-02 NOTE — Progress Notes (Signed)
PT Cancellation Note  Patient Details Name: Rachel [name] MRN: [id number] DOB: Oct 29, 1960   Cancelled Treatment:    Reason Eval/Treat Not Completed: Patient at procedure or test/unavailable (patient at cath lab)   Duncan Dull 06/02/2014, 8:28 AM Alben Deeds, PT DPT  [phone]

## 2014-06-02 NOTE — Interval H&P Note (Signed)
History and Physical Interval Note:  06/02/2014 7:49 AM  Rachel [name]  has presented today for surgery, with the diagnosis of HEART FAILURE  The various methods of treatment have been discussed with the patient and family. After consideration of risks, benefits and other options for treatment, the patient has consented to  Procedure(s): RIGHT HEART CATH (N/A) as a surgical intervention .  The patient's history has been reviewed, patient examined, no change in status, stable for surgery.  I have reviewed the patient's chart and labs.  Questions were answered to the patient's satisfaction.     Saryn Cherry [employer]

## 2014-06-02 NOTE — H&P (View-Only) (Signed)
Patient ID: Rachel [name], female   DOB: [birth year], 54 y.o.   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 She required NRB.   She diuresed well yesterday, net negative 2 L.  Yesterday during day she developed SVT, probably atypical atrial flutter, rate 150s.  This eventually converted back to NSR on amiodarone.   Breathing is improving but she is nauseated.    BMET pending.    Objective:   Weight Range:  Vital Signs:   Temp:  [97.6 F (36.4 C)-98.8 F (37.1 C)] 97.9 F (36.6 C) (02/24 0400) Pulse Rate:  [77-147] 88 (02/24 0700) Resp:  [12-25] 17 (02/24 0700) BP: (67-126)/(46-89) 106/75 mmHg (02/24 0700) SpO2:  [93 %-100 %] 93 % (02/24 0700) FiO2 (%):  [100 %] 100 % (02/23 1900) Weight:  [171 lb 8.3 oz (77.8 kg)] 171 lb 8.3 oz (77.8 kg) (02/24 0400) Last BM Date: 05/29/14  Weight change: Filed Weights   05/30/14 1645 05/31/14 0409 06/01/14 0400  Weight: 171 lb 8.3 oz (77.8 kg) 171 lb (77.565 kg) 171 lb 8.3 oz (77.8 kg)    Intake/Output:   Intake/Output Summary (Last 24 hours) at 06/01/14 0731 Last data filed at 06/01/14 0600  Gross per 24 hour  Intake 1401.62 ml  Output   3500 ml  Net -2098.38 ml     Physical Exam: General: Chronically ill. Breathless when talking.  Psych: Normal affect. Neuro: Alert and oriented X 3. Moves all extremities spontaneously. HEENT: Normal Neck: No bruits. + elevated JVD at level of ear with + HJR. Lungs: bilateral diffuse crackles c/w fibrosis. Mild expiratory wheezing on 80% NRB Heart: tachy rate, reg rhythm no s3, s4, Soft 1/6 murmur at the LUSB. Abdomen: Soft, RUQ tenderness, non-distended, BS + x 4.  Extremities: No clubbing, cyanosis or edema. DP/PT/Radials 2+ and equal  bilaterally.  Telemetry: ST 100s   Labs: Basic Metabolic Panel:  Recent Labs Lab 05/30/14 0950 05/31/14 0713 05/31/14 1507  NA 143 141  --   K 4.5 5.1 5.1  CL 101 103  --   CO2 35* 32  --   GLUCOSE 105* 130*  --   BUN 15 22  --   CREATININE 1.04 1.13*  --   CALCIUM 9.1 8.8  --   MG  --   --  2.1    Liver Function Tests:  Recent Labs Lab 05/30/14 0950 05/31/14 0713  AST 25 44*  ALT 28 48*  ALKPHOS 68 65  BILITOT 0.8 1.6*  PROT 6.6 6.5  ALBUMIN 3.3* 3.3*    Recent Labs Lab 05/30/14 0950  LIPASE 21   No results for input(s): AMMONIA in the last 168 hours.  CBC:  Recent Labs Lab 05/30/14 0950 05/31/14 0713  WBC 11.5* 11.5*  HGB 10.3* 10.5*  HCT 34.9* 36.3  MCV 87.5 91.4  PLT 348 371    Cardiac Enzymes:  Recent Labs Lab 05/30/14 1014 05/30/14 1834 05/30/14 2311 05/31/14 0713  TROPONINI 0.04* 0.04* 0.03 0.03    BNP: BNP (last 3 results)  Recent Labs  05/20/14 1147 05/30/14 0950  BNP 314.6* 533.5*    ProBNP (last 3 results)  Recent Labs  01/05/14 1417 02/02/14 1553 03/07/14 1036  PROBNP 549.1* 1294.0* 1560.0*      Other results:  EKG:   Imaging: Dg Chest 2 View  05/30/2014   CLINICAL DATA:  Difficulty breathing. History of COPD and hypertension  EXAM: CHEST  2 VIEW  COMPARISON:  February 03, 2014  FINDINGS: There is underlying emphysema. There is extensive interstitial fibrosis, particularly in the upper lobes. There is no frank edema or consolidation. Heart is enlarged with pulmonary vascularity within normal limits. Pacemaker lead is attached to the right ventricle. There is no demonstrable adenopathy. No bone lesions.  IMPRESSION: Emphysema with extensive interstitial fibrotic change, stable. No frank edema or consolidation. Stable cardiomegaly.   Electronically Signed   By: Lowella Grip III M.D.   On: 05/30/2014 12:05     Medications:     Scheduled Medications: . albuterol  2.5 mg Nebulization Q4H  . antiseptic  oral rinse  7 mL Mouth Rinse BID  . ARIPiprazole  2 mg Oral QODAY  . aspirin EC  81 mg Oral Daily  . budesonide-formoterol  1 puff Inhalation BID  . ceFEPime (MAXIPIME) IV  1 g Intravenous Q8H  . furosemide  80 mg Intravenous BID  . pantoprazole  40 mg Oral Daily  . PARoxetine  20 mg Oral Daily  . predniSONE  20 mg Oral Daily  . sildenafil  80 mg Oral TID  . sodium chloride  3 mL Intravenous Q12H  . Treprostinil  18 mcg Inhalation QID  . vancomycin  1,000 mg Intravenous Q12H    Infusions: . amiodarone Stopped (06/01/14 0240)  . heparin 1,050 Units/hr (06/01/14 0000)    PRN Medications: acetaminophen, albuterol, ondansetron **OR** ondansetron (ZOFRAN) IV   Assessment/Plan    1. Acute on chronic diastolic CHF with primarily RV failure: She diuresed well yesterday, breathing has improved some.  JVP remains elevated.    BMET pending.  - Continue current Lasix 80 mg IV bid today.  - RHC tomorrow morning.  2. Acute on chronic respiratory failure: On NRBM.  She has severe parenchymal disease from end-stage pulmonary sarcoidosis.  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 She is on vancomycin and cefepime for empiric HCAP coverage.  Poor prognosis with end stage lung disease, only option would appear to be transplant and not sure she would make it through this.  3. PAH:  Probably primarily group 3 from parenchymal lung disease (sarcoidosis).  She has been on Revatio and Tyvaso long-term.  Plan RHC tomorrow, consideration will be given to initiation of endothelin receptor antagonist.   4. Pulmonary Sarcoid: Pulmonary following. Remains on prednisone.  5. SVT: Suspect this was atypical atrial flutter.  This converted to NSR on amiodarone.  I will stop this today, not good long-term amio candidate with severe lung disease.  I am also going to put her back on Fabens heparin.  Will add diltiazem CD 120 mg daily.    Length of Stay: 2  [name]  06/01/2014, 7:31 AM  Creatinine up to 1.55.  Got Lasix this morning, will hold further doses and reassess by RHC tomorrow morning.   [name] 06/01/2014 9:13 AM

## 2014-06-02 NOTE — CV Procedure (Signed)
Cardiac Catheterization Procedure Note  Name: Rachel [name] MRN: [id number] DOB: 18-May-1960  Procedure: Right Heart Cath  Indication: Sarcoidosis, pulmonary hypertension.    Procedural Details: The right groin was prepped, draped, and anesthetized with 1% lidocaine. Using the modified Seldinger technique a 7 French sheath was placed in the right femoral vein. A Swan-Ganz catheter was used for the right heart catheterization. Standard protocol was followed for recording of right heart pressures and sampling of oxygen saturations. Fick and thermodilution cardiac output was calculated. The patient went into atrial flutter with RVR briefly during the procedure but converted back to NSR spontaneously. The patient was transferred to the post catheterization recovery area for further monitoring.  Procedural Findings: Hemodynamics (mmHg) RA mean 25 RV 71/26 PA 72/41, mean 54 PCWP mean 15  Oxygen saturations: PA machine failed, unable to read sample.  AO 89%  Cardiac Output (Thermo) 4.94 Cardiac Index (Thermo) 2.73  PVR 7.9 WU  Final Conclusions:  Severe pulmonary arterial hypertension with nearly normal PCWP but markedly elevated right-sided filling pressures suggesting primarily RV failure.  Cardiac output relatively preserved.   - With near-normal PCWP, I am going to add ambrisentan 5 mg daily.  - Creatinine back to 1 today.  I think that we need to try to continue to diurese her to off-load her RV.  Will resume Lasix 80 mg IV bid.  Follow creatinine closely.   [name] 06/02/2014, 8:21 AM

## 2014-06-02 NOTE — Progress Notes (Signed)
OT Cancellation Note  Patient Details Name: Rachel [name] MRN: [id number] DOB: [birth year]   Cancelled Treatment:    Reason Eval/Treat Not Completed: Patient at procedure or test/ unavailable (Pt at cath lab. Will continue to follow.)  Malka So 06/02/2014, 8:36 AM

## 2014-06-03 ENCOUNTER — Inpatient Hospital Stay (HOSPITAL_COMMUNITY): Payer: Medicare Other

## 2014-06-03 ENCOUNTER — Encounter (HOSPITAL_COMMUNITY): Payer: Self-pay

## 2014-06-03 LAB — BASIC METABOLIC PANEL
ANION GAP: 6 (ref 5–15)
Anion gap: 8 (ref 5–15)
BUN: 15 mg/dL (ref 6–23)
BUN: 16 mg/dL (ref 6–23)
CALCIUM: 8.2 mg/dL — AB (ref 8.4–10.5)
CO2: 48 mmol/L — AB (ref 19–32)
CO2: 43 mmol/L (ref 19–32)
CREATININE: 0.99 mg/dL (ref 0.50–1.10)
Calcium: 8.9 mg/dL (ref 8.4–10.5)
Chloride: 83 mmol/L — ABNORMAL LOW (ref 96–112)
Chloride: 85 mmol/L — ABNORMAL LOW (ref 96–112)
Creatinine, Ser: 0.89 mg/dL (ref 0.50–1.10)
GFR calc Af Amer: 84 mL/min — ABNORMAL LOW (ref >90)
GFR calc Af Amer: 74 mL/min — ABNORMAL LOW (ref >90)
GFR calc non Af Amer: 64 mL/min — ABNORMAL LOW (ref >90)
GFR, EST NON AFRICAN AMERICAN: 73 mL/min — AB (ref >90)
GLUCOSE: 119 mg/dL — AB (ref 70–99)
Glucose, Bld: 119 mg/dL — ABNORMAL HIGH (ref 70–99)
POTASSIUM: 3.6 mmol/L (ref 3.5–5.1)
Potassium: 3 mmol/L — ABNORMAL LOW (ref 3.5–5.1)
Sodium: 137 mmol/L (ref 135–145)
Sodium: 136 mmol/L (ref 135–145)

## 2014-06-03 LAB — COMPREHENSIVE METABOLIC PANEL
ALBUMIN: 3.3 g/dL — AB (ref 3.5–5.2)
ALK PHOS: 66 U/L (ref 39–117)
ALT: 122 U/L — ABNORMAL HIGH (ref 0–35)
AST: 62 U/L — ABNORMAL HIGH (ref 0–37)
Anion gap: 3 — ABNORMAL LOW (ref 5–15)
BILIRUBIN TOTAL: 0.9 mg/dL (ref 0.3–1.2)
BUN: 15 mg/dL (ref 6–23)
CHLORIDE: 82 mmol/L — AB (ref 96–112)
CO2: 50 mmol/L — AB (ref 19–32)
CREATININE: 0.88 mg/dL (ref 0.50–1.10)
Calcium: 8.5 mg/dL (ref 8.4–10.5)
GFR calc Af Amer: 85 mL/min — ABNORMAL LOW (ref >90)
GFR calc non Af Amer: 74 mL/min — ABNORMAL LOW (ref >90)
Glucose, Bld: 142 mg/dL — ABNORMAL HIGH (ref 70–99)
POTASSIUM: 2.9 mmol/L — AB (ref 3.5–5.1)
Sodium: 135 mmol/L (ref 135–145)
Total Protein: 6.4 g/dL (ref 6.0–8.3)

## 2014-06-03 LAB — EXPECTORATED SPUTUM ASSESSMENT W GRAM STAIN, RFLX TO RESP C

## 2014-06-03 LAB — BLOOD GAS, ARTERIAL
Acid-Base Excess: 22.2 mmol/L — ABNORMAL HIGH (ref 0.0–2.0)
BICARBONATE: 48.9 meq/L — AB (ref 20.0–24.0)
Drawn by: 419341
O2 Content: 100 L/min
O2 Saturation: 100 %
PATIENT TEMPERATURE: 98.6
PO2 ART: 186 mmHg — AB (ref 80.0–100.0)
TCO2: 51.6 mmol/L (ref 0–100)
pCO2 arterial: 87.9 mmHg (ref 35.0–45.0)
pH, Arterial: 7.364 (ref 7.350–7.450)

## 2014-06-03 LAB — CBC
HCT: 35.4 % — ABNORMAL LOW (ref 36.0–46.0)
HEMOGLOBIN: 10.1 g/dL — AB (ref 12.0–15.0)
MCH: 25.7 pg — AB (ref 26.0–34.0)
MCHC: 28.5 g/dL — ABNORMAL LOW (ref 30.0–36.0)
MCV: 90.1 fL (ref 78.0–100.0)
Platelets: 288 10*3/uL (ref 150–400)
RBC: 3.93 MIL/uL (ref 3.87–5.11)
RDW: 18.1 % — ABNORMAL HIGH (ref 11.5–15.5)
WBC: 10.6 10*3/uL — ABNORMAL HIGH (ref 4.0–10.5)

## 2014-06-03 LAB — HEPATITIS PANEL, ACUTE
HCV Ab: NEGATIVE
HEP A IGM: NONREACTIVE
Hep B C IgM: NONREACTIVE
Hepatitis B Surface Ag: NEGATIVE

## 2014-06-03 LAB — EXPECTORATED SPUTUM ASSESSMENT W REFEX TO RESP CULTURE

## 2014-06-03 LAB — MAGNESIUM: Magnesium: 2.4 mg/dL (ref 1.5–2.5)

## 2014-06-03 MED ORDER — MAGNESIUM SULFATE 2 GM/50ML IV SOLN
2.0000 g | Freq: Once | INTRAVENOUS | Status: AC
  Administered 2014-06-03: 2 g via INTRAVENOUS
  Filled 2014-06-03: qty 50

## 2014-06-03 MED ORDER — SODIUM CHLORIDE 0.9 % IJ SOLN
10.0000 mL | Freq: Two times a day (BID) | INTRAMUSCULAR | Status: DC
  Administered 2014-06-03 – 2014-06-04 (×3): 10 mL

## 2014-06-03 MED ORDER — ACETAZOLAMIDE 250 MG PO TABS
250.0000 mg | ORAL_TABLET | Freq: Every day | ORAL | Status: DC
  Administered 2014-06-03 – 2014-06-06 (×4): 250 mg via ORAL
  Filled 2014-06-03 (×5): qty 1

## 2014-06-03 MED ORDER — POTASSIUM CHLORIDE CRYS ER 20 MEQ PO TBCR
40.0000 meq | EXTENDED_RELEASE_TABLET | Freq: Two times a day (BID) | ORAL | Status: AC
  Administered 2014-06-03 (×2): 40 meq via ORAL
  Filled 2014-06-03 (×2): qty 2

## 2014-06-03 MED ORDER — AMIODARONE IV BOLUS ONLY 150 MG/100ML: 150.0000 mg | Freq: Once | INTRAVENOUS | Status: DC

## 2014-06-03 MED ORDER — AMIODARONE IV BOLUS ONLY 150 MG/100ML
150.0000 mg | Freq: Once | INTRAVENOUS | Status: AC
  Administered 2014-06-03: 150 mg via INTRAVENOUS
  Filled 2014-06-03: qty 100

## 2014-06-03 MED ORDER — SODIUM CHLORIDE 0.9 % IJ SOLN
10.0000 mL | INTRAMUSCULAR | Status: DC | PRN
  Administered 2014-06-06 – 2014-06-07 (×4): 10 mL
  Filled 2014-06-03 (×4): qty 40

## 2014-06-03 MED ORDER — POTASSIUM CHLORIDE CRYS ER 20 MEQ PO TBCR: 40.0000 meq | EXTENDED_RELEASE_TABLET | Freq: Two times a day (BID) | ORAL | Status: DC

## 2014-06-03 MED ORDER — POTASSIUM CHLORIDE CRYS ER 20 MEQ PO TBCR
40.0000 meq | EXTENDED_RELEASE_TABLET | Freq: Once | ORAL | Status: AC
  Administered 2014-06-03: 40 meq via ORAL
  Filled 2014-06-03: qty 2

## 2014-06-03 MED ORDER — DIGOXIN 125 MCG PO TABS
0.1250 mg | ORAL_TABLET | Freq: Every day | ORAL | Status: DC
  Administered 2014-06-03 – 2014-06-04 (×2): 0.125 mg via ORAL
  Filled 2014-06-03 (×3): qty 1

## 2014-06-03 NOTE — Progress Notes (Signed)
OT Cancellation Note  Patient Details Name: Rachel [name] MRN: [id number] DOB: 1961/01/14   Cancelled Treatment:    Reason Eval/Treat Not Completed: Medical issues which prohibited therapy. Pt with chest pain and elevated HR.  Will follow and initiate OT as appropriate.  Malka So 06/03/2014, 1:53 PM

## 2014-06-03 NOTE — Progress Notes (Signed)
[name] patient son notified that Dr Hilma Favors will have family meeting 06-04-14 at 33. Son states that he will let his sister know and they will be present.

## 2014-06-03 NOTE — Progress Notes (Addendum)
Patient ID: Rachel [name], female   DOB: 1960-05-04, 54 y.o.   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 She required NRB.   She diuresed well yesterday, net negative 2 L.  She has had periodic runs of SVT, probably atypical atrial flutter, rate 150s.  This has been spontaneously converted but once she got a bolus of amiodarone with conversion.  She has been started on po diltiazem CD.   RHC (2/25): RA mean 25 RV 71/26 PA 72/41, mean 54 PCWP mean 15 Oxygen saturations: PA machine failed, unable to read sample.  AO 89% Cardiac Output (Thermo) 4.94 Cardiac Index (Thermo) 2.73 PVR 7.9 WU  She has been getting IV Lasix for RV failure.  Creatinine is stable with good diuresis.  K is low with some cramping today.  Still short of breath, on NRBM.     Objective:   Weight Range:  Vital Signs:   Temp:  [97.7 F (36.5 C)-98.9 F (37.2 C)] 97.9 F (36.6 C) (02/26 0544) Pulse Rate:  [72-126] 126 (02/26 0612) Resp:  [13-26] 17 (02/25 1553) BP: (82-113)/(45-71) 103/55 mmHg (02/26 0544) SpO2:  [85 %-100 %] 94 % (02/26 0612) FiO2 (%):  [100 %] 100 % (02/25 1523) Weight:  [164 lb 14.5 oz (74.8 kg)] 164 lb 14.5 oz (74.8 kg) (02/26 0500) Last BM Date: 05/29/14  Weight change: Filed Weights   06/01/14 0400 06/02/14 0500 06/03/14 0500  Weight: 171 lb 8.3 oz (77.8 kg) 165 lb 9.1 oz (75.1 kg) 164 lb 14.5 oz (74.8 kg)    Intake/Output:   Intake/Output Summary (Last 24 hours) at 06/03/14 0727 Last data filed at 06/03/14 0600  Gross per 24 hour  Intake   1003 ml  Output   4750 ml  Net  -3747 ml     Physical Exam: General: Chronically ill. Breathless when talking.  Psych: Normal affect. Neuro: Alert and oriented X 3. Moves all  extremities spontaneously. HEENT: Normal Neck: No bruits. + elevated JVD at level of ear with + HJR. Lungs: bilateral diffuse crackles c/w fibrosis. Mild expiratory wheezing on 80% NRB Heart: tachy rate, reg rhythm no s3, s4, Soft 1/6 murmur at the LUSB. Abdomen: Soft, nontender, non-distended  Extremities: No clubbing, cyanosis or edema. DP/PT/Radials 2+ and equal bilaterally.  Telemetry: NSR 70s  Labs: Basic Metabolic Panel:  Recent Labs Lab 05/31/14 0713 05/31/14 1507 06/01/14 0700 06/02/14 0305 06/02/14 1000 06/03/14 0345  NA 141  --  141 140 137 137  K 5.1 5.1 4.5 4.1 4.0 3.0*  CL 103  --  94* 91* 92* 83*  CO2 32  --  35* 38* 41* 48*  GLUCOSE 130*  --  121* 123* 128* 119*  BUN 22  --  24* _0 CREATININE 1.13*  --  1.55* 1.09 1.11* 0.89  CALCIUM 8.8  --  8.9 9.2 8.6 8.2*  MG  --  2.1  --   --   --   --     Liver Function Tests:  Recent Labs Lab 05/30/14 0950 05/31/14 0713 06/01/14 0700 06/02/14 1000  AST 25 44* 86* 86*  ALT 28 48* 93* 127*  ALKPHOS 68 65 74 65  BILITOT 0.8 1.6* 1.1 0.9  PROT 6.6 6.5 6.4 6.2  ALBUMIN  3.3* 3.3* 3.3* 3.2*    Recent Labs Lab 05/30/14 0950  LIPASE 21   No results for input(s): AMMONIA in the last 168 hours.  CBC:  Recent Labs Lab 05/31/14 0713 06/01/14 0700 06/02/14 0305 06/02/14 1000 06/03/14 0345  WBC 11.5* 13.0* 11.1* 11.1* 10.6*  NEUTROABS  --  9.9*  --   --   --   HGB 10.5* 10.3* 9.6* 10.4* 10.1*  HCT 36.3 34.7* 33.5* 35.3* 35.4*  MCV 91.4 88.7 88.9 90.5 90.1  PLT 371 356 333 317 288    Cardiac Enzymes:  Recent Labs Lab 05/30/14 1014 05/30/14 1834 05/30/14 2311 05/31/14 0713  TROPONINI 0.04* 0.04* 0.03 0.03    BNP: BNP (last 3 results)  Recent Labs  05/20/14 1147 05/30/14 0950  BNP 314.6* 533.5*    ProBNP (last 3 results)  Recent Labs  01/05/14 1417 02/02/14 1553 03/07/14 1036  PROBNP 549.1* 1294.0* 1560.0*      Other results:  EKG:   Imaging: No results  found.   Medications:     Scheduled Medications: . albuterol  2.5 mg Nebulization Q4H  . ambrisentan  5 mg Oral Daily  . antiseptic oral rinse  7 mL Mouth Rinse BID  . ARIPiprazole  2 mg Oral QODAY  . aspirin EC  81 mg Oral Daily  . budesonide-formoterol  1 puff Inhalation BID  . ceFEPime (MAXIPIME) IV  1 g Intravenous 3 times per day  . diltiazem  120 mg Oral Daily  . fentaNYL  25 mcg Intravenous Once  . furosemide  80 mg Intravenous BID  . heparin  5,000 Units Subcutaneous 3 times per day  . magnesium sulfate 1 - 4 g bolus IVPB  2 g Intravenous Once  . pantoprazole  40 mg Oral Daily  . PARoxetine  20 mg Oral Daily  . potassium chloride  40 mEq Oral Once  . potassium chloride  40 mEq Oral BID  . predniSONE  20 mg Oral Daily  . sildenafil  80 mg Oral TID  . sodium chloride  3 mL Intravenous Q12H  . Treprostinil  18 mcg Inhalation QID  . vancomycin  750 mg Intravenous Q12H    Infusions:    PRN Medications: acetaminophen, acetaminophen, albuterol, ondansetron **OR** ondansetron (ZOFRAN) IV, ondansetron (ZOFRAN) IV   Assessment/Plan    1. 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  She diuresed well yesterday, breathing has improved some.  JVP remains elevated.   K is low. - Continue current Lasix 80 mg IV bid today.  - She will need aggressive K and Mg replacement, will get pm BMET.  - With elevated HCO3, will add acetazolamide.   2. Acute on chronic respiratory failure: On NRBM.  She has severe parenchymal disease from end-stage pulmonary sarcoidosis.  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 She is on vancomycin and cefepime for empiric HCAP coverage.  Poor prognosis with end stage lung disease, only option would appear to be transplant and not sure she would make it through this.   3. PAH:  Probably primarily group 3 from parenchymal lung disease (sarcoidosis).  She has been on Revatio and Tyvaso long-term.  This showed PCWP 15 with severe PAH.  I added ambrisentan 5 mg daily to her regimen yesterday.  If tolerated, can titrate up to 10 mg daily.  4. Pulmonary Sarcoid: Pulmonary following. Remains on prednisone.  5. SVT: Suspect this was atypical atrial flutter.  She has had brief runs. Not a good long-term amiodarone candidate with severe lung disease. She is on diltiazem CD 120 mg daily.  Length of Stay: 4  [place] Aundra Dubin  06/03/2014, 7:27 AM  BP running low.  Will stop diltiazem for now, she remains in NSR.  I will use digoxin instead, this can help with both RV support and rate control for atrial arrhythmias.   [name] 06/03/2014 9:21 AM

## 2014-06-03 NOTE — Progress Notes (Signed)
Pt's HR went back into 130's and sustained for about 10 min. Pt states she had no discomfort this time. Pt come back out of rhythm on her own. Will continue to monitor.

## 2014-06-03 NOTE — Progress Notes (Addendum)
PULMONARY / CRITICAL CARE MEDICINE   Name: Rachel [name] MRN: [id number] DOB: June 25, 1960    ADMISSION DATE:  05/30/2014 CONSULTATION DATE:  05/30/2014  REFERRING MD :  EDP  CHIEF COMPLAINT:  Productive cough  INITIAL PRESENTATION: 54 year old female with severe sarcoidosis and PAH presented to [facility] ED 2/22 c/o productive cough, vomiting, and weakness for several weeks. In ED she was hypoxemic requiring 100% NRB. PCCM consulted  STUDIES:  Echo 9/15 >EF 55-60%, moderately dilated RV with mildly decreased systolic function, PA systolic pressure 54.   SIGNIFICANT EVENTS:  HISTORY OF PRESENT ILLNESS:  54 year old female with PMH as below, which is significant for stage IV pulmonary sarcoidosis, mod-severe secondary PAH, NSVT (ICD in place).  Well known to me from office practice.  She takes Revatio and Tyvaso at home. Underwent RHC 2/25 under Dr. Aundra Dubin who is her primary cardiologist. She was seen by him in the office 2/12 at which time her lasix was increased due to progressive LE edema. Plan was to consider transplant workup again.  Most recent pulmonary appointment 1/14. It was recommended that she increase her O2 and prednisone, however, she was opposed to these changes - the O2 bothers her nose, high dose pred gives her severe side effects.   She presented to [facility] ED 2/22 c/o weakness, malaise, diarrhea that started 5 days PTA.  She has also had productive cough for 2 weeks. She was noted to be hypoxemic and was placed on 100% NRB en route to ED. In ED she was found to have stable chronic findings on CXR with mild leukocytosis and elevated BNP. She was hypoxic, requiring NRB to maintain sats in low 80s (85-90% is her baseline). Her home steroids were continued and she was started on empiric HCAP coverage. She was admitted to [facility].  SUBJECTIVE:  Continues to be hypoxemic despite high flow O2 More lethargic, hypotensive last 24h Experienced an acute brady event overnight 2/25  VITAL SIGNS: Temp:   [97.6 F (36.4 C)-98.6 F (37 C)] 97.6 F (36.4 C) (02/26 1228) Pulse Rate:  [72-126] 81 (02/26 1200) Resp:  [17] 17 (02/25 1553) BP: (77-103)/(39-69) 77/39 mmHg (02/26 1200) SpO2:  [83 %-100 %] 83 % (02/26 1200) FiO2 (%):  [100 %] 100 % (02/25 1523) Weight:  [74.8 kg (164 lb 14.5 oz)] 74.8 kg (164 lb 14.5 oz) (02/26 0500) HEMODYNAMICS:   VENTILATOR SETTINGS: Vent Mode:  [-]  FiO2 (%):  [100 %] 100 % INTAKE / OUTPUT:  Intake/Output Summary (Last 24 hours) at 06/03/14 1232 Last data filed at 06/03/14 1000  Gross per 24 hour  Intake   1070 ml  Output   3400 ml  Net  -2330 ml    PHYSICAL EXAMINATION: General:  Weak, interacts but depressed affect Neuro:  Globally weak, oriented x3 HEENT:  OP dry, PERRL, visible JVP 6+ cm Cardiovascular:  Regular, loud s2 Lungs:  Distant, B insp crackles Abdomen:  Soft, benign Musculoskeletal:  Trace edema Skin:  No rash  LABS:  CBC  Recent Labs Lab 06/02/14 0305 06/02/14 1000 06/03/14 0345  WBC 11.1* 11.1* 10.6*  HGB 9.6* 10.4* 10.1*  HCT 33.5* 35.3* 35.4*  PLT 333 317 288   Coag's  Recent Labs Lab 06/02/14 0305  INR 1.22   BMET  Recent Labs Lab 06/02/14 1000 06/03/14 0345 06/03/14 1103  NA 137 137 135  K 4.0 3.0* 2.9*  CL 92* 83* 82*  CO2 41* 48* 50*  BUN _0 CREATININE  1.11* 0.89 0.88  GLUCOSE 128* 119* 142*   Electrolytes  Recent Labs Lab 05/31/14 1507  06/02/14 1000 06/03/14 0345 06/03/14 1103  CALCIUM  --   < > 8.6 8.2* 8.5  MG 2.1  --   --   --   --   < > = values in this interval not displayed. Sepsis Markers  Recent Labs Lab 05/30/14 1020 05/30/14 1443  LATICACIDVEN 1.52 1.21   ABG  Recent Labs Lab 06/03/14 0533  PHART 7.364  PCO2ART 87.9*  PO2ART 186.0*   Liver Enzymes  Recent Labs Lab 06/01/14 0700 06/02/14 1000 06/03/14 1103  AST 86* 86* 62*  ALT 93* 127* 122*  ALKPHOS 74 65 66  BILITOT 1.1 0.9 0.9  ALBUMIN 3.3* 3.2* 3.3*   Cardiac Enzymes  Recent  Labs Lab 05/30/14 1834 05/30/14 2311 05/31/14 0713  TROPONINI 0.04* 0.03 0.03   Glucose No results for input(s): GLUCAP in the last 168 hours.  Imaging No results found.  PA-c 2/25:  RA mean 25 RV 71/26 PA 72/41, mean 54 PCWP mean 15 Oxygen saturations: PA machine failed, unable to read sample.  AO 89% Cardiac Output (Thermo) 4.94 Cardiac Index (Thermo) 2.73 PVR 7.9 WU  ASSESSMENT / PLAN:  Acute on chronic respiratory failure in setting acute infection, superimposed on severe PAH / sarcoidosis  Sarcoidosis Stage IV with severe parenchymal scar and infiltrate Severe secondary PAH, better defined by PA-c on 2/25 Chronic severe hypoxemia Apparent acute viral syndrome with diarrhea, URI sx Possible PNA, doubt. Her pulmonary infiltrates are chronic  Recs:  - would finish course abx as planned although unclear whether this was PNA - scheduled bronchodilators - maximize O2 (acknowledge she is chronically low at home, which is why she ultimately decompensates) - continue Revatio and homeTyvaso. She has felt that these medications have benefited her over the years, although overall data would support an ERB over these meds in setting sarcoidosis. Agree with trial of Letaris, started this week - doubt she has much room for diuresis, at least not quickly. Will need to hold today - would place PICC line if we are going to continue labs and her current interventions (which I believe we are)  - I have discussed with Dr Aundra Dubin whether she will be able to face the rigors of transplant. She would likely need Heart + Lung Tx. We agree that she is a poor candidate for this. The patient also says that she doesn't think she can tolerate.   - broached subject of palliative care and hospice care with her and her daughter today, explained the change in philosophy from curative to symptom  based management. She understands the reasoning behind this. I will consult Palliative Care to see her -  discussed code status with her today > I have recommended that she should NOT undergo ACLS or MV under any circumstances. She understands this and agrees with me. I will place DNR orders now.  - we will continue to follow and try to assist with her care  CC time 35 minutes  Baltazar Apo, MD, PhD 06/03/2014, 12:32 PM [facility] [phone] or if no answer [phone]

## 2014-06-03 NOTE — Progress Notes (Signed)
Patient noted to have an increase in heart rate to 140 x 30 minutes Patient states that she has chest discomfort. Dr Aundra Dubin notified orders given see MAR. Iv team able to get IV in right finger.

## 2014-06-03 NOTE — Progress Notes (Signed)
ON IV assessment of Left hand/wrist 22g IV patient reported pain during flushing with NS.  IV infiltration on assessment and edema in that area.  IV removed and Assessment performed to restart new PIV to administer medications.  On this new assessment patient has swelling of right wrist and hand that she reports is from infiltration of IV yesterday or last night.  This RN unable to find new location for PIV.  I placed new order for IV team consult and made [facility] RN aware.    Rica Records RN-[facility] Clinic instructor

## 2014-06-03 NOTE — Progress Notes (Signed)
Family Medicine Teaching Service Daily Progress Note Intern Pager: [phone]  Patient name: Rachel [name] Medical record number: [id number] Date of birth: November 09, 1960 Age: 54 y.o. Gender: female  Primary Care Provider: Christa See, MD Consultants: Cardiology, Pulmonology Code Status: Full Code (addressed this admission)  Pt Overview and Major Events to Date:  2/22-2/23: Admitted with acute on chronic respiratory failure, acute infection superimposed on [place] with sarcoidosis IV and acute on chronic CHF with Cor Pulmonale.   2/24-2/25: Following Cr. Attempting to diurese. Heart Cath today with worsening RH F.  Assessment and Plan: GUDRUN AXE is a 54 y.o. female presenting with worsening shortness of breath, increase oxygen requirement, and acute resp illness with diarrhea.Acute on chronic respiratory failure in the setting of infection with PAH, Sarcoidosis IV, and acute on chronic CHF with Cor pulmonale. PMH is significant for severe pHTN, pulmonary sarcoidosis, RV failure, HTN, Anxiety, cardiomyopathy, NSVT and Psychosis.  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 She was referred to [facility] in the past for lung transplant evaluation. Cough and increase in sputum production for at least a week prior to admission. Pts office allergy list includes doxy and high dose steroids (psychosis). Chest X-ray with Emphysema with extensive interstitial fibrotic change, stable. No frank edema or consolidation. Stable cardiomegaly. - Little progress at this point, and seems to be worsening. Will likely have little recovery. Discussed with pulm. Continue to follow their recs. Discussing this prognosis with pt. She has been resistant to palliative care in the past.  - RHC with Severe pulm AHTN PA - 54 mmhg, but PCWP 15.  - Continue to treat HCAP with cefepime / Vanc (day 4/5) - Sputum culture  inadequate.   - Blood Cx NGTD.  - Strep pneumo urine negative. Influenza negative. HIV negative. TSH normal. RVP neg, Legionella neg.  - Continue workup for lung tx per pulm and cards.  - On 100% (15 L) non-re breather with sats maintained at this O2 requirement.   - Continue PAH medications: Revatio 80 mg TID/Tyvaso 18 mcg QID - Will keep steroids at 20 mg for now, Psychosis with high dose steroids per chart, will defer to pulm for increase.  - albuterol PRN  Cardiomyopathy: last Echo (9/15) with EF 55-60%, moderately dilated RV with mildly decreased systolic function, PA systolic pressure 54. Rate controlled with Diltiazem after SVT / atrial flutter. > Some hypotension this morning. MAP 54.   - Discussed with Cards. Holding Dilt for now and Lasix if SBP <80 and symptomatic. Restart diuresis otherwise.  - Diuresed nearly 5L yesterday. Continue to monitor I/O.  - Right Heart Cath > Severe PAHTN, Near Nl. PCWP. Primary RV failure.  - Adding Letairis to [employer] and Revatio. - Following cards recs.  - Diuresing with 1m of lasix BID. Following Cr closely back down to 0.8.   - Wt. Down 2lbs.  - TSH normal.  - BNP 533 - Trop mildly elevated >> demand ischemia per cards.  - EKG: ST, Atrial premature complexes, Borderline right axis deviation - EKG PRN for chest pain, and in am  Pulmonary hypertension with RV failure: last Echo (9/15) with EF 55-60%, moderately dilated RV with mildly decreased systolic function, PA systolic pressure 54. Pt scheduled for cath 2/25 with Dr. MAundra Dubin- Cardiology with RLaneas above. Severe PAHTN and RV failure.  - Per pulm may need heart / lung txp.  - Diuresis per  cards / pulm. Following blood pressures closely given preload dependence.  - Continued Revatio, Tyvaso  Congestive Hepatopathy - Pt. Here with some mild TTP of RUQ, mild transaminitis and tbili 1.6 yesterday. Amiodarone could also contribute to transaminitis, now DC'd. Following LFT's. This in the  setting of known RHF. JVP on exam, but difficult to evaluate hepatojugular reflux.  - CMET with some improvement of transaminases this am.  - Continue to suspect injury primarily due to RHF and congestion especially with findings in recent [place].  - Medical management for RHF primary goal.  - Hep panel negative, TSH normal,  - Continue diuresis as above, but careful not to overdiurese.  - Continue to trend CMET - Monitor closely  - Serial exams.  - Diuresing. Monitor closely with preload dependence.   AKI -  Resolving. Monitor Cr closely.  - Scr back to baseline. Monitor closely.  - Monitor closely in the face of diuresis and tenuous cardiopulmonary status.   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 Given end stage lung disease and normalization of LV function, the device was not replaced at ERI and tachy therapies were turned off.  - Intermittent rate control problems. Dilt discontinue 2/2 hypotension. Continue to watch closely.  - Currently NSR   Anxiety/Depression: Paxil 20 mg daily. Abilify 54m qod.   GERD: Continue PPI  FEN/GI: sips/chips meds, will hold on MIVF considering edema and increase in BNP Prophylaxis: Hep sq, PPI  Disposition: Pending Cards and Pulm Recs regarding Txp. Will get SW consult for home situation.    Subjective:  Pt. With NAEON, though she did refuse her breathing treatments. This am, on my entrance, she was having some hypotension with MAP near 54. 78/36 initially and then 78/46. She was complaining of dizziness / lightheadedness, and some increased SOB. She did receive a breathing treatment shortly thereafter that seemed to help. O2 sats initially dropped to the 70's also since she was on 6L [place], but improved with replacement of nonrebreather.   Objective: Temp:  [97.7 F (36.5 C)-98.9 F (37.2 C)] 98.5 F (36.9 C) (02/26 0803) Pulse Rate:  [72-126] 73 (02/26 0803) Resp:  [17-26] 17 (02/25 1553) BP: (82-113)/(45-71) 87/45 mmHg (02/26 0803) SpO2:   [85 %-100 %] 93 % (02/26 0847) FiO2 (%):  [100 %] 100 % (02/25 1523) Weight:  [164 lb 14.5 oz (74.8 kg)] 164 lb 14.5 oz (74.8 kg) (02/26 0500) Physical Exam: General: NAD, Sitting up in bed Cardiovascular: RRR, RSB systolic ejection murmur 2/6, Right parasternal heave.  No gallops, no rubs. 2+ distal pulses, JVP present.   Respiratory: Crackles throughout, no wheezes, or rales. Somewhat more tight this am with less air movement,  Appropriate rate, mildly labored with Fraser in place initially > 15 L Nonrebreather  Abdomen: S, Mild TTP over RUQ continues, Liver edge smooth, just below costal margin, +BS, No organomegaly.  Extremities: WWP, puffy lower extremities without frank pitting, baseline.   Laboratory:  Recent Labs Lab 06/02/14 0305 06/02/14 1000 06/03/14 0345  WBC 11.1* 11.1* 10.6*  HGB 9.6* 10.4* 10.1*  HCT 33.5* 35.3* 35.4*  PLT 333 317 288    Recent Labs Lab 05/31/14 0713  06/01/14 0700 06/02/14 0305 06/02/14 1000 06/03/14 0345  NA 141  --  141 140 137 137  K 5.1  < > 4.5 4.1 4.0 3.0*  CL 103  --  94* 91* 92* 83*  CO2 32  --  35* 38* 41* 48*  BUN 22  --  24* 21  21 15  CREATININE 1.13*  --  1.55* 1.09 1.11* 0.89  CALCIUM 8.8  --  8.9 9.2 8.6 8.2*  PROT 6.5  --  6.4  --  6.2  --   BILITOT 1.6*  --  1.1  --  0.9  --   ALKPHOS 65  --  74  --  65  --   ALT 48*  --  93*  --  127*  --   AST 44*  --  86*  --  86*  --   GLUCOSE 130*  --  121* 123* 128* 119*  < > = values in this interval not displayed.  RVP - pending Legionella - pending Strep pneumo urine - negative.   Trop - 0.04 > 0.03 TSH - 1.048 Lactic Acid - 1.21   ABG -  PH - 7.36 CO2 - 87.9 Bicarb - 48   Imaging/Diagnostic Tests:  CXR 2/22 - IMPRESSION: Emphysema with extensive interstitial fibrotic change, stable. No frank edema or consolidation. Stable cardiomegaly.   Aquilla Hacker, MD 06/03/2014, 9:49 AM PGY-1, [place] Intern pager: [phone], text pages  welcome

## 2014-06-03 NOTE — Progress Notes (Signed)
Family Practice Teaching Service Interval Progress Note  S: Notified by Dr. Minda Ditto and RN of patient's drop in BP to 70s/30-40s (MAP 54) with some lightheadedness and nausea (had overnight without improvement). Additionally overnight pt had been refusing duoneb breathing treatments. This AM her O2 was attempted to wean down to 6L [place] off of 12-15L NRB around 0815, reportedly did not tolerate this wean with O2 desat to 70s. Replaced back on 15L NRB. Respiratory therapy called and given duoneb treatment, with gradual improvement and some tachycardia following. BP re-check improved to 88/56. Pt requesting some water to drink and feels "better". Admits lightheaded / dizziness, nausea, SOB Denies chest pain or pressure, cough, edema  O: BP 88/56 mmHg  Pulse 73  Temp(Src) 98.5 F (36.9 C) (Oral)  Resp 17  Ht _0  (1.6 m)  Wt 164 lb 14.5 oz (74.8 kg)  BMI 29.22 kg/m2  SpO2 88-90% on NRB 15 L  Net down 3.7L over past 24 hours  Gen - sitting up in bed, mild resp distress (similar to baseline), otherwise NAD  HEENT - NRB mask in place Heart - tachycardic, regular rhythm, no murmurs heard Lungs - Bilateral diffuse crackles (stable h/o fibrosis), scattered exp wheezes bilateral, mild inc work of breathing, limited speech due to neb treatment (1-2 words vs non-verbal gestures) Ext - non-tender, no pitting edema, peripheral pulses intact +2 b/l Skin - warm, dry, no rashes Neuro - awake, alert, oriented, grossly non-focal  A&P: Briefly, Rachel [name] is a 80 yr F admitted for acute on chronic diastolic CHF (Right HF), Acute on chronic respiratory failure (in setting of stage IV pulm sarcoid, and PAH), overall considered end-stage cardiopulmonary disease. - Clinically improved after breathing treatment, reduced SOB - Maintaining O2 sat on 15L NRB up to 98% - BP improved on re-check 88/56 (MAP 64) - Paged Cardiology - discussed case with Dr. Aundra Dubin - advised continue current plan with diuresis in  setting of end-stage RHF with elevated CVPs, AVOID fluids (no bolus), may hold lasix dose if BP remains < 80 +symptomatic  - Advised RN to hold Lasix 88m IV 0800 dose, and re-check VS and eval for symptoms of hypotension with lightheadedness / dizziness, if SBP >80 and asymptomatic can give Lasix 80 IV dose around 1000 or 1100 today, push back evening dose accordingly - Paged Pulmonology - discussed case with Dr. BIleene Musa- plans to see patient this morning for rounds, reiterated her chronic poor oxygenation at baseline, limited options, difficult end point to current hospitalization, will consider discussing palliative care options. Follow-up recommendations  ANobie Putnam DO Family Medicine PGY-2 Service Pager [phone]

## 2014-06-03 NOTE — Progress Notes (Signed)
Patient c/o nausea, " feeling bad" desats when places on 6L n/c placed back on NRB at 15 Liters. B/p low see chart. Dr. Aundra Dubin notified family medicine notified.

## 2014-06-03 NOTE — Discharge Summary (Signed)
[facility] Discharge Summary  Patient name: Rachel [name] Medical record number: [id number] Date of birth: [birth year] Age: 54 y.o. Gender: female Date of Admission: 05/30/2014  Date of Discharge: 06/05/14 Admitting Physician: Blane Ohara McDiarmid, MD  Primary Care Provider: Christa See, MD Consultants: Cardiology, Pulmonology, Palliative  Indication for Hospitalization: Acute on Chronic Respiratory Failure.   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   Disposition: To Hospice Care  Discharge Condition: Stable  Discharge Exam:  BP 94/58 mmHg  Pulse 93  Temp(Src) 98.5 F (36.9 C) (Axillary)  Resp 20  Ht _0  (1.6 m)  Wt 163 lb 5.8 oz (74.1 kg)  BMI 28.95 kg/m2  SpO2 94% General: NAD, Sitting up in bed having breakfast; NRB. Cardiovascular: RRR, RSB systolic ejection murmur 2/6, Right parasternal heave. O2 Mask in place.  Respiratory: Mild labored breathing. Bibasilar crackles noted.  Extremities: 1+ LE edema.  Neuro: follows commands, mumbles words Psych: poor eye contact, flat affect  Brief Hospital Course:  Rachel [name] is a 54 y.o. 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  Acute on Chronic Respiratory Failure: Pt. Was admitted with worsening respiratory failure in the setting of increasing oxygen requirement and suspected infection superimposed on chronic lung disease. She was initially treated with  antibiotics, workup was unrevealing, and imaging was nonfocal, though did demonstrate baseline abnormal pulmonary picture. She was started on diuresis per cardiology in order to alleviate cardiopulmonary overload due to chronic right heart failure. She has been on pulmonary arterial dilators Tyvaso, and Revatio. In addition to prednisone for management of her lung disease. She has been evaluated previously at [facility] for lung transplant, however she has decided against this. She had a right heart cath revealing normal capillary wedge pressure in the setting of these bronchodilators. However, she did have PA P revealing of severe pulmonary arterial hypertension. She as started on Letairis per cardiology. In the setting of worsening cardiopulmonary disease, she has decided to pursue comfort care given the severity of her illness and limited benefit in pursuing further medical care with little likelihood of a true cure. Per pulmonology and cardiology, she would likely need a heart and lung transplant, and may not survive such a stressful surgery. Patient has opted for hospice and palliative care. She was started on a dilaudid drip titrated for comfort prior to discharge.   Primary Right Heart Failure -  Patient has known right heart failure 2/2 dilated cardiomyopathy, pulmonary hypertension. She had a right heart catheterization by Dr. Aundra Dubin during this admission which revealed severe pulmonary arterial hypertension, primary right ventricular failure. She has been on Revatio, and Tyvaso as above. Letairis added after catheterization. Diuresis initiated on admission with worsening pulmonary status. Diuresis was continued in order to improve RV function, however her blood pressures did not tolerate well. She also had episodes of SVT during this admission, which she has experienced previously. Initially place don amiodarone, then diltiazem, however blood pressures did not tolerate. Due to worsening cardiopulmonary disease  as above with little benefit in further treatment, patient made  the decision to pursue comfort care and hospice.   Congestive Hepatopathy, AKI  - Evidence of worsening cardiopulmonary disease during this admission. AKI resolved, however Transaminitis with worsening RV failure indicative of worsening hepatic congestion.   Anxiety / Depression - Pt. With history of anxiety previously on Paxil and Abilify. Adjust as needed for comfort.   Issues for Follow Up:  1. Hospice / Palliative care - narcotics for air hunger 2. Titration of Tyvaso, Revatio, and Letairis  3. Diuresis for comfort 4. Respiratory support for comfort.   Significant Procedures: Right Heart Catherteriztion - see operative report   Significant Labs and Imaging:   Recent Labs Lab 06/02/14 1000 06/03/14 0345 06/04/14 0403  WBC 11.1* 10.6* 11.4*  HGB 10.4* 10.1* 10.2*  HCT 35.3* 35.4* 35.3*  PLT 317 288 306    Recent Labs Lab 05/31/14 0713 05/31/14 1507 06/01/14 0700  06/02/14 1000 06/03/14 0345 06/03/14 1103 06/03/14 1906 06/04/14 0403  NA 141  --  141  < > 137 137 135 136 136  K 5.1 5.1 4.5  < > 4.0 3.0* 2.9* 3.6 3.7  CL 103  --  94*  < > 92* 83* 82* 85* 87*  CO2 32  --  35*  < > 41* 48* 50* 43* 43*  GLUCOSE 130*  --  121*  < > 128* 119* 142* 119* 103*  BUN 22  --  24*  < > _0 CREATININE 1.13*  --  1.55*  < > 1.11* 0.89 0.88 0.99 0.88  CALCIUM 8.8  --  8.9  < > 8.6 8.2* 8.5 8.9 9.0  MG  --  2.1  --   --   --   --   --  2.4 2.2  ALKPHOS 65  --  74  --  65  --  66  --  64  AST 44*  --  86*  --  86*  --  62*  --  39*  ALT 48*  --  93*  --  127*  --  122*  --  108*  ALBUMIN 3.3*  --  3.3*  --  3.2*  --  3.3*  --  3.4*  < > = values in this interval not displayed. RVP - pending Legionella - pending Strep pneumo urine - negative.   Trop - 0.04 > 0.03 TSH - 1.048 Lactic Acid - 1.21 ABG -  PH - 7.36 CO2 - 87.9 Bicarb - 48  Imaging/Diagnostic Tests:  CXR 2/22 - IMPRESSION: Emphysema  with extensive interstitial fibrotic change, stable. No frank edema or consolidation. Stable cardiomegaly.  Results/Tests Pending at Time of Discharge: None  Discharge Medications:    Medication List    STOP taking these medications        ARIPiprazole 2 MG tablet  Commonly known as:  ABILIFY     aspirin EC 81 MG tablet     benzonatate 100 MG capsule  Commonly known as:  TESSALON PERLES     esomeprazole 40 MG capsule  Commonly known as:  NEXIUM     nystatin 100000 UNIT/ML suspension  Commonly known as:  MYCOSTATIN     PARoxetine 20 MG tablet  Commonly known as:  PAXIL     potassium chloride SA 20 MEQ tablet  Commonly known as:  K-DUR,KLOR-CON     predniSONE 10 MG tablet  Commonly known as:  DELTASONE      TAKE these medications  acetaZOLAMIDE 250 MG tablet  Commonly known as:  DIAMOX  Take 1 tablet (250 mg total) by mouth daily.     albuterol 108 (90 BASE) MCG/ACT inhaler  Commonly known as:  PROAIR HFA  Inhale 2 puffs into the lungs 4 (four) times daily.     ambrisentan 5 MG tablet  Commonly known as:  LETAIRIS  Take 1 tablet (5 mg total) by mouth daily.     budesonide-formoterol 160-4.5 MCG/ACT inhaler  Commonly known as:  SYMBICORT  Inhale 1 puff into the lungs 2 (two) times daily.     furosemide 20 MG tablet  Commonly known as:  LASIX  Take 3 tablets (60 mg total) by mouth 2 (two) times daily.     HYDROmorphone 2 mg/mL Soln  Commonly known as:  DILAUDID  Inject 1 mg into the vein every 30 (thirty) minutes as needed (Dyspnea, Respirations >25).     LORazepam 2 MG/ML injection  Commonly known as:  ATIVAN  Inject 0.5 mLs (1 mg total) into the vein every 4 (four) hours as needed for anxiety.     ondansetron 4 MG/2ML Soln injection  Commonly known as:  ZOFRAN  Inject 2 mLs (4 mg total) into the vein every 6 (six) hours as needed for nausea.     sildenafil 20 MG tablet  Commonly known as:  REVATIO  Take 4 tablets (80 mg total) by mouth 3  (three) times daily. take 4  tablets every 8 hours     Treprostinil 0.6 MG/ML Soln  Commonly known as:  TYVASO  Inhale 18 mcg into the lungs 4 (four) times daily.        Discharge Instructions: Please refer to Patient Instructions section of EMR for full details.  Patient was counseled important signs and symptoms that should prompt return to medical care, changes in medications, dietary instructions, activity restrictions, and follow up appointments.   Follow-Up Appointments:   Janora Norlander, DO 06/05/2014, 11:55 AM PGY-1, [place]

## 2014-06-03 NOTE — Progress Notes (Signed)
Patient got out of bed without calling for assistance twice today patient alert however states she needed to go to the bathroom bed alarm on reeducated patient on calling for assistance. Patient gait steady.

## 2014-06-03 NOTE — Progress Notes (Signed)
PT Cancellation Note  Patient Details Name: Rachel [name] MRN: [id number] DOB: 03/25/1961   Cancelled Treatment:    Reason Eval/Treat Not Completed: Patient not medically ready.  Pt with elevated HR and chest pain.  RN currently with pt.  Will hold PT at this time and f/u as appropriate.     Kathelene Rumberger, Thornton Papas 06/03/2014, 1:50 PM

## 2014-06-04 DIAGNOSIS — Z515 Encounter for palliative care: Secondary | ICD-10-CM

## 2014-06-04 LAB — COMPREHENSIVE METABOLIC PANEL
ALBUMIN: 3.4 g/dL — AB (ref 3.5–5.2)
ALT: 108 U/L — ABNORMAL HIGH (ref 0–35)
AST: 39 U/L — AB (ref 0–37)
Alkaline Phosphatase: 64 U/L (ref 39–117)
Anion gap: 6 (ref 5–15)
BILIRUBIN TOTAL: 0.9 mg/dL (ref 0.3–1.2)
BUN: 13 mg/dL (ref 6–23)
CHLORIDE: 87 mmol/L — AB (ref 96–112)
CO2: 43 mmol/L — AB (ref 19–32)
Calcium: 9 mg/dL (ref 8.4–10.5)
Creatinine, Ser: 0.88 mg/dL (ref 0.50–1.10)
GFR calc Af Amer: 85 mL/min — ABNORMAL LOW (ref >90)
GFR calc non Af Amer: 74 mL/min — ABNORMAL LOW (ref >90)
GLUCOSE: 103 mg/dL — AB (ref 70–99)
Potassium: 3.7 mmol/L (ref 3.5–5.1)
SODIUM: 136 mmol/L (ref 135–145)
TOTAL PROTEIN: 6.6 g/dL (ref 6.0–8.3)

## 2014-06-04 LAB — CBC
HCT: 35.3 % — ABNORMAL LOW (ref 36.0–46.0)
Hemoglobin: 10.2 g/dL — ABNORMAL LOW (ref 12.0–15.0)
MCH: 26.6 pg (ref 26.0–34.0)
MCHC: 28.9 g/dL — ABNORMAL LOW (ref 30.0–36.0)
MCV: 92.2 fL (ref 78.0–100.0)
PLATELETS: 306 10*3/uL (ref 150–400)
RBC: 3.83 MIL/uL — ABNORMAL LOW (ref 3.87–5.11)
RDW: 18.1 % — AB (ref 11.5–15.5)
WBC: 11.4 10*3/uL — ABNORMAL HIGH (ref 4.0–10.5)

## 2014-06-04 LAB — MAGNESIUM: MAGNESIUM: 2.2 mg/dL (ref 1.5–2.5)

## 2014-06-04 MED ORDER — HYDROMORPHONE BOLUS VIA INFUSION
1.0000 mg | INTRAVENOUS | Status: DC | PRN
  Filled 2014-06-04: qty 1

## 2014-06-04 MED ORDER — LORAZEPAM 2 MG/ML IJ SOLN
1.0000 mg | INTRAMUSCULAR | Status: DC | PRN
  Administered 2014-06-05: 1 mg via INTRAVENOUS
  Filled 2014-06-04: qty 1

## 2014-06-04 MED ORDER — HYDROMORPHONE HCL PF 10 MG/ML IJ SOLN
0.5000 mg/h | INTRAMUSCULAR | Status: DC
  Administered 2014-06-04 – 2014-06-05 (×2): 0.5 mg/h via INTRAVENOUS
  Filled 2014-06-04 (×3): qty 10

## 2014-06-04 MED ORDER — DEXAMETHASONE SODIUM PHOSPHATE 4 MG/ML IJ SOLN
4.0000 mg | INTRAMUSCULAR | Status: DC
  Administered 2014-06-04 – 2014-06-06 (×3): 4 mg via INTRAVENOUS
  Filled 2014-06-04 (×3): qty 1

## 2014-06-04 NOTE — Progress Notes (Signed)
Patient ID: Rachel [name], female   DOB: 12-29-1960, 54 y.o.   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 She required NRB.   Continues on IV lasix and diamox.  Remains SOB on NRB. Feels bad. Weight down well below baseline. Palliative Care meeting at 1230 today  Objective:   Weight Range:  Vital Signs:   Temp:  [97 F (36.1 C)-98.5 F (36.9 C)] 98.5 F (36.9 C) (02/27 1137) Pulse Rate:  [73-146] 93 (02/27 0700) Resp:  [16-20] 20 (02/27 1137) BP: (77-113)/(39-80) 94/58 mmHg (02/27 1137) SpO2:  [75 %-100 %] 94 % (02/27 1137) FiO2 (%):  [100 %] 100 % (02/27 0300) Weight:  [74.1 kg (163 lb 5.8 oz)] 74.1 kg (163 lb 5.8 oz) (02/27 0300) Last BM Date: 05/29/14  Weight change: Filed Weights   06/02/14 0500 06/03/14 0500 06/04/14 0300  Weight: 75.1 kg (165 lb 9.1 oz) 74.8 kg (164 lb 14.5 oz) 74.1 kg (163 lb 5.8 oz)    Intake/Output:   Intake/Output Summary (Last 24 hours) at 06/04/14 1144 Last data filed at 06/04/14 1100  Gross per 24 hour  Intake   1280 ml  Output   1650 ml  Net   -370 ml     Physical Exam: General: Chronically ill. Breathless when talking. On NRB Psych: Normal affect. Neuro: Alert and oriented X 3. Moves all extremities spontaneously. HEENT: Normal Neck: No bruits. + elevated JVD at level of ear with + HJR. Lungs: bilateral diffuse crackles c/w fibrosis. Mild expiratory wheezing on 80% NRB Heart: tachy rate, reg rhythm no s3, s4, Soft 1/6 murmur at the LUSB. Abdomen: Soft, nontender, non-distended  Extremities: No clubbing, cyanosis. 1+ edema. DP/PT/Radials 2+ and equal bilaterally.  Telemetry: NSR 70s  Labs: Basic Metabolic Panel:  Recent Labs Lab 05/31/14 1507  06/02/14 1000  06/03/14 0345 06/03/14 1103 06/03/14 1906 06/04/14 0403  NA  --   < > 137 137 135 136 136  K 5.1  < > 4.0 3.0* 2.9* 3.6 3.7  CL  --   < > 92* 83* 82* 85* 87*  CO2  --   < > 41* 48* 50* 43* 43*  GLUCOSE  --   < > 128* 119* 142* 119* 103*  BUN  --   < > _0 CREATININE  --   < > 1.11* 0.89 0.88 0.99 0.88  CALCIUM  --   < > 8.6 8.2* 8.5 8.9 9.0  MG 2.1  --   --   --   --  2.4 2.2  < > = values in this interval not displayed.  Liver Function Tests:  Recent Labs Lab 05/31/14 0713 06/01/14 0700 06/02/14 1000 06/03/14 1103 06/04/14 0403  AST 44* 86* 86* 62* 39*  ALT 48* 93* 127* 122* 108*  ALKPHOS 65 74 65 66 64  BILITOT 1.6* 1.1 0.9 0.9 0.9  PROT 6.5 6.4 6.2 6.4 6.6  ALBUMIN 3.3* 3.3* 3.2* 3.3* 3.4*    Recent Labs Lab 05/30/14 0950  LIPASE 21   No results for input(s): AMMONIA in the last 168 hours.  CBC:  Recent Labs Lab 06/01/14 0700 06/02/14 0305 06/02/14 1000 06/03/14 0345 06/04/14 0403  WBC 13.0* 11.1* 11.1* 10.6* 11.4*  NEUTROABS 9.9*  --   --   --   --  HGB 10.3* 9.6* 10.4* 10.1* 10.2*  HCT 34.7* 33.5* 35.3* 35.4* 35.3*  MCV 88.7 88.9 90.5 90.1 92.2  PLT 356 333 317 288 306    Cardiac Enzymes:  Recent Labs Lab 05/30/14 1014 05/30/14 1834 05/30/14 2311 05/31/14 0713  TROPONINI 0.04* 0.04* 0.03 0.03    BNP: BNP (last 3 results)  Recent Labs  05/20/14 1147 05/30/14 0950  BNP 314.6* 533.5*    ProBNP (last 3 results)  Recent Labs  01/05/14 1417 02/02/14 1553 03/07/14 1036  PROBNP 549.1* 1294.0* 1560.0*      Other results:    Imaging: Dg Chest Port 1 View  06/03/2014   CLINICAL DATA:  Central catheter placement  EXAM: PORTABLE CHEST - 1 VIEW  COMPARISON:  May 30, 2014  FINDINGS: Central catheter tip is in the superior vena cava. No pneumothorax. Underlying emphysema with areas of extensive interstitial fibrosis remains stable. There is new mild atelectasis in the right base. No other new opacity is seen. Heart  is enlarged with pulmonary vascularity grossly normal. Pacemaker lead is attached to the right ventricle.  IMPRESSION: New central catheter with tip in superior vena cava. No pneumothorax. Underlying fibrotic change and emphysema, stable. New mild atelectasis left base. Stable cardiomegaly.   Electronically Signed   By: Lowella Grip III M.D.   On: 06/03/2014 16:03     Medications:     Scheduled Medications: . acetaZOLAMIDE  250 mg Oral Daily  . albuterol  2.5 mg Nebulization Q4H  . ambrisentan  5 mg Oral Daily  . antiseptic oral rinse  7 mL Mouth Rinse BID  . ARIPiprazole  2 mg Oral QODAY  . aspirin EC  81 mg Oral Daily  . budesonide-formoterol  1 puff Inhalation BID  . ceFEPime (MAXIPIME) IV  1 g Intravenous 3 times per day  . digoxin  0.125 mg Oral Daily  . fentaNYL  25 mcg Intravenous Once  . furosemide  80 mg Intravenous BID  . heparin  5,000 Units Subcutaneous 3 times per day  . pantoprazole  40 mg Oral Daily  . PARoxetine  20 mg Oral Daily  . predniSONE  20 mg Oral Daily  . sildenafil  80 mg Oral TID  . sodium chloride  10-40 mL Intracatheter Q12H  . sodium chloride  3 mL Intravenous Q12H  . Treprostinil  18 mcg Inhalation QID  . vancomycin  750 mg Intravenous Q12H    Infusions:    PRN Medications: acetaminophen, acetaminophen, albuterol, ondansetron **OR** ondansetron (ZOFRAN) IV, ondansetron (ZOFRAN) IV, sodium chloride   Assessment/Plan    1. 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 Wedge was 15.   2. Acute on chronic respiratory failure: On NRBM.  She has severe parenchymal disease from end-stage pulmonary sarcoidosis.  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 She is on vancomycin and cefepime for empiric HCAP coverage.  Poor prognosis with end stage lung disease,  only option would appear to be transplant and not sure she would make it through this.  3. PAH:  Probably primarily group 3 from parenchymal lung disease (sarcoidosis).  She has been on Revatio and Tyvaso long-term.  This showed PCWP 15 with severe PAH.  IDr. Aundra Dubin added ambrisentan 5 mg daily 4. Pulmonary Sarcoid: Pulmonary following. Remains on prednisone.  5. SVT: Suspect this was atypical atrial flutter.  She has had brief runs. Not a good long-term amiodarone candidate with severe lung disease.Unable to tolerate diltiazem CD 120 mg daily due to low BP. Now on digoxin 6. DNR  She continues to do poorly. I think the main issue is that her lung parenchyma has just gotten so bad that there will be little improvement with diuresis or treatment of her PAH. FEV1 last year was 1.1 and I suspect it is worse. Will continue diuresis for now but I think only real option is Hospice.   Length of Stay: 5  Glori Bickers MD 06/04/2014, 11:44 AM

## 2014-06-04 NOTE — Progress Notes (Signed)
Family Medicine PGY-3 PCP Note  Chart reviewed and pt discussed at various times with current FPTS inpt team members. I greatly appreciate the excellent, compassionate care provided by the inpatient FPTS team, cardiology, and palliative care as well as all nursing and other ancillary staff on behalf of my patient. Rachel [name] has unfortunately been declining for quite some time now. I am very saddened for her and her loved ones but I am also very pleased to see that arrangements are being made with her comfort foremost in mind. Please do not hesitate to contact me if I can be of any help.  Rachel Kluver, MD PGY-3, [facility] 06/04/2014, 5:53 PM First call: [place] pager: [phone] (text pages welcome through [facility]) Personal pager: [phone]

## 2014-06-04 NOTE — Progress Notes (Signed)
PT Cancellation Note  Patient Details Name: Rachel [name] MRN: [id number] DOB: January 23, 1961   Cancelled Treatment:    Reason Eval/Treat Not Completed: Patient not medically ready (decreased resp. status, hold therapy per nsg)   Duncan Dull 06/04/2014, 11:36 AM Alben Deeds, PT DPT  [phone]

## 2014-06-04 NOTE — Progress Notes (Signed)
A friend of the pt came into room with her husband a little after shift change. Pt was resting in bed but was down in bed slightly. Pt's friend asked if she could be moved up in the bed. I asked pt first if she wanted to be moved; pt only moaned and did not give verbal response. Pt's friend insisted that she be pulled up in bed. I started to go to get help to pull her up but pt's friend insisted that the pt's son and her husband pull the pt up. I observed to insure the pt's safety. Will continue to monitor at this time.

## 2014-06-04 NOTE — Consult Note (Addendum)
Palliative Medicine Team Consult Note  54 yo with end stage PAH from pulmonary fibrosis- she continues to decline despite maximal interventions and treatment. I met with patient and her family- patient is asking for peace and comfort. She has severe dyspnea. She is on 100% NRB-she desats to 20% when O2 is taken off. Severe tachypnea.  Family with active grief. She is tired- she can barely speak but endorses comfort and knows that she is dying. Daughter asking about support for her young grandchildren.   All are in agreement for full comfort care.   Prognosis: hours- days  Ideally I would like to have her moved to [facility]- this family would very much benefit from [place] and a team of support. Patient also is going to need careful attention to her comfort- once the Tyvaso is stopped I expect a rapid decline similar to a terminal wean and she may need rapid escalation of her infusion to achieve comfort and sedation.   Hospice Facility would be the best place for her EOL care- family beginning to gather at bedside.   Discontinuation of Prostacyclins requires careful symptom management due to rapid onset of distressing symptoms.   1. Initiating a Dilaudid Infusion- titration orders for comfort given 2. Ativan for anxiety and sedation 3. Stop all invasive procedures, IV sticks and meds not related to comfort 4. Will maintain Tyvaso and Letaris until all family have arrived and stop once we determine if transfer to hospice house is even possible. 5. Will also leave on her diuretics as a comfort measure and wean those off as sedation and dyspnea are controlled with dilaudid infusion. 6. Discontinued Bipap 7, Stopped antibiotics 8. Continue Nebulizers 9. Ok to continue steroids for now- will discontinue gradually.  Will follow closely. Appreciate Chaplain involvement.  Time: 1240-130PM [place] 50 min Greater than 50%  of this time was spent counseling and coordinating care  related to the above assessment and plan.   Lane Hacker, DO Palliative Medicine [phone]

## 2014-06-04 NOTE — Progress Notes (Signed)
Chaplain visited with [place] family.   Chaplain asked pt if she had a lot on her mind, she nodded in agreement. After a long pause, I asked if whether she could put it into words, she shook her head "no".   Two family members present, both present as being heavily contemplative. The atmosphere feels pretty heavy. No one desired to speak on their feelings. Chaplain made the family aware of spiritual care services and support if they so desired.   Page if needed.   Delford Field, Chaplain 06/04/2014

## 2014-06-04 NOTE — Progress Notes (Addendum)
Family Medicine Teaching Service Daily Progress Note Intern Pager: [phone]  Patient name: Rachel [name] Medical record number: [id number] Date of birth: [birth year] Age: 54 y.o. 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   2/24-2/25: Following Cr. Attempting to diurese. Heart Cath today with worsening RH F.  Assessment and Plan: Rachel [name] is a 54 y.o. 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 PMH is significant for severe pHTN, pulmonary sarcoidosis, RV failure, HTN, Anxiety, cardiomyopathy, NSVT and Psychosis.  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 She was referred to [facility] in the past for lung transplant evaluation. Cough and increase in sputum production for at least a week prior to admission. Pts office allergy list includes doxy and high dose steroids (psychosis). Chest X-ray with Emphysema with extensive interstitial fibrotic change, stable. No frank edema or consolidation. Stable cardiomegaly.RHC with Severe pulm AHTN PA - 54 mmhg, but PCWP 15.  - Little progress at this point, and seems to be worsening. Will likely have little recovery. Pulm and cards have been consulted and making recommendations.  - discussion yesterday about GOC has resulted with a meeting to be held today at 12:30. Patient is now DNR. Dispo pending palliative meeting results today.  - Continuing cefepime / Vanc (day 5) -  Blood Cx NGTD.  - Strep pneumo urine negative. Influenza negative. HIV negative. TSH normal. RVP neg, Legionella neg.  - On 100% (15 L) non-re breather with sats maintained at this O2 requirement.   - Continue PAH medications: Revatio 80 mg TID/Tyvaso 18 mcg QID, started on ERB  - Will keep steroids at 20 mg for now, Psychosis with high dose steroids per chart, will defer to pulm for increase.  - albuterol PRN  Cardiomyopathy: last Echo (9/15) with EF 55-60%, moderately dilated RV with mildly decreased systolic function, PA systolic pressure 54. Rate controlled with Diltiazem after SVT / atrial flutter.  MAP 64.  Trop mildly elevated >> demand ischemia per cards. EKG: ST, Atrial premature complexes, Borderline right axis deviation - Holding Dilt for now, started digoxin, and Lasix if SBP <80 and symptomatic. Restart diuresis otherwise.  - Diuresed nearly 5L yesterday. Continue to monitor I/O.  - Right Heart Cath > Severe PAHTN, Near Nl. PCWP. Primary RV failure.  - Adding Letairis to [employer] and Revatio. - Following cards recs.  - Diuresing with 6m of lasix BID. Following Cr closely back down to 0.8.   - Wt. Down 2lbs.  - EKG PRN for chest pain, and in am  Pulmonary hypertension with RV failure: last Echo (9/15) with EF 55-60%, moderately dilated RV with mildly decreased systolic function, PA systolic pressure 54. Pt scheduled for cath 2/25 with Dr. MAundra Dubin- Cardiology with RWhitingas above. Severe PAHTN and RV failure.  - Per pulm may need heart / lung txp.  - Diuresis per cards / pulm. Following blood pressures closely given preload dependence.  - Continued Revatio, Tyvaso  Congestive Hepatopathy - Pt. Here with some mild TTP of RUQ,  mild transaminitis and tbili 1.6 yesterday. Amiodarone could also contribute to transaminitis, now DC'd. Following LFT's. This in the setting of known RHF. JVP on exam, but difficult to evaluate hepatojugular reflux.  - CMET again with some improvement of  transaminases this am.  - Continue to suspect injury primarily due to RHF and congestion especially with findings in recent [place].  - Medical management for RHF primary goal.  - Hep panel negative, TSH normal,  - Continue diuresis as above, but careful not to overdiurese.  - Continue to trend CMET - Monitor closely  - Serial exams.  - Diuresing. Monitor closely with preload dependence.   AKI -  Resolving. Monitor Cr closely.  - Scr back to baseline. Monitor closely.  - Monitor closely in the face of diuresis and tenuous cardiopulmonary status.   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 Given end stage lung disease and normalization of LV function, the device was not replaced at ERI and tachy therapies were turned off.  - Intermittent rate control problems. Dilt discontinue 2/2 hypotension. Digoxin started. Continue to watch closely.  - Currently NSR   Anxiety/Depression: Paxil 20 mg daily. Abilify 21m qod.   GERD: Continue PPI  FEN/GI: sips/chips meds, will hold on MIVF considering edema and increase in BNP Prophylaxis: Hep sq, PPI  Disposition: Pending Cards/Pulm Recs and palliative care regarding Txp. Will get SW consult for home situation.    Subjective:  No acute overnight events.  Objective: Temp:  [97 F (36.1 C)-98 F (36.7 C)] 97 F (36.1 C) (02/27 0700) Pulse Rate:  [73-146] 93 (02/27 0700) Resp:  [16-19] 19 (02/27 0700) BP: (77-113)/(39-80) 99/80 mmHg (02/27 0700) SpO2:  [75 %-100 %] 84 % (02/27 0839) FiO2 (%):  [100 %] 100 % (02/27 0300) Weight:  [163 lb 5.8 oz (74.1 kg)] 163 lb 5.8 oz (74.1 kg) (02/27 0300) Physical Exam: General: NAD, Sitting up in bed; NRB. Cardiovascular: RRR, RSB systolic ejection murmur 2/6, Right parasternal heave.    Respiratory: Mild labored breathing. Crackles noted.  Extremities: 1+ LE edema.   Laboratory:  Recent Labs Lab 06/02/14 1000 06/03/14 0345 06/04/14 0403  WBC 11.1* 10.6* 11.4*  HGB 10.4* 10.1* 10.2*  HCT  35.3* 35.4* 35.3*  PLT 317 288 306    Recent Labs Lab 06/02/14 1000  06/03/14 1103 06/03/14 1906 06/04/14 0403  NA 137  < > 135 136 136  K 4.0  < > 2.9* 3.6 3.7  CL 92*  < > 82* 85* 87*  CO2 41*  < > 50* 43* 43*  BUN 21  < > _0 CREATININE 1.11*  < > 0.88 0.99 0.88  CALCIUM 8.6  < > 8.5 8.9 9.0  PROT 6.2  --  6.4  --  6.6  BILITOT 0.9  --  0.9  --  0.9  ALKPHOS 65  --  66  --  64  ALT 127*  --  122*  --  108*  AST 86*  --  62*  --  39*  GLUCOSE 128*  < > 142* 119* 103*  < > = values in this interval not displayed.  RVP - pending Legionella - pending Strep pneumo urine - negative.   Trop - 0.04 > 0.03 TSH - 1.048 Lactic Acid - 1.21   ABG -  PH - 7.36 CO2 - 87.9 Bicarb - 48   Imaging/Diagnostic Tests:  CXR 2/22 - IMPRESSION: Emphysema with extensive interstitial fibrotic change, stable. No frank edema or  consolidation. Stable cardiomegaly.   Ma Hillock, DO 06/04/2014, 8:48 AM PGY-3, [place] Intern pager: [phone], text pages welcome  Dr. Raoul Pitch had to leave due to illness. I have seen the above patient.   I have reviewed her note.  Patient likely nearing end of life. Palliative meeting today.   Ali Chukson PGY-3 Pager #: [phone]

## 2014-06-04 NOTE — Progress Notes (Signed)
Foley huddle done.Pt meets criteria for end of life care. Foley inserted with aseptic technique after performing pericare prior to insertion. Pt tolerated well.   Ellamae Sia

## 2014-06-05 DIAGNOSIS — Z515 Encounter for palliative care: Secondary | ICD-10-CM | POA: Insufficient documentation

## 2014-06-05 LAB — CULTURE, BLOOD (ROUTINE X 2)
Culture: NO GROWTH
Culture: NO GROWTH

## 2014-06-05 LAB — CULTURE, RESPIRATORY: CULTURE: NORMAL

## 2014-06-05 LAB — CULTURE, RESPIRATORY W GRAM STAIN

## 2014-06-05 MED ORDER — ACETAZOLAMIDE 250 MG PO TABS: 250.0000 mg | ORAL_TABLET | Freq: Every day | ORAL | Status: AC

## 2014-06-05 MED ORDER — AMBRISENTAN 5 MG PO TABS: 5.0000 mg | ORAL_TABLET | Freq: Every day | ORAL | Status: AC

## 2014-06-05 MED ORDER — ONDANSETRON HCL 4 MG/2ML IJ SOLN: 4.0000 mg | Freq: Four times a day (QID) | INTRAMUSCULAR | Status: AC | PRN

## 2014-06-05 MED ORDER — LORAZEPAM 2 MG/ML IJ SOLN: 1.0000 mg | INTRAMUSCULAR | Status: DC | PRN

## 2014-06-05 MED ORDER — ALBUTEROL SULFATE (2.5 MG/3ML) 0.083% IN NEBU: 2.5000 mg | INHALATION_SOLUTION | RESPIRATORY_TRACT | Status: DC | PRN

## 2014-06-05 MED ORDER — HYDROMORPHONE BOLUS VIA INFUSION: 1.0000 mg | INTRAVENOUS | Status: DC | PRN

## 2014-06-05 NOTE — Progress Notes (Signed)
Spoke with daughter re: available bed at [facility].  Daughter told CSW that pt "is not going to [facility]" because it would not be convenient for her friends/family to get there.  Daughter wishes that pt go home with [facility] providing services and states that pt will have care provided to her by family members.  RN informed.  CSW to discuss referral with RNCM.

## 2014-06-05 NOTE — Progress Notes (Signed)
Awaiting return call from [facility] re: their bed availability.  Will continue to update.

## 2014-06-05 NOTE — Progress Notes (Signed)
Wasted 25cc Dilaudid IV down the sink with Gerome Sam, RN.

## 2014-06-05 NOTE — Progress Notes (Signed)
Spoke with Dr Hilma Favors while attempting to coordinate home hospice, it was determined that home hospice not a feasible option since she is on a Dilaudid gtt and hospice home of [place] is too far away from her family.  At this time family is in agreement that pt will stay in the hospital until a bed becomes available at [facility] place.  CM aware of this and will update SW.  Pt transported to 6N31.  Family away of transfer and met [place] on the floor.  Pt was awake and alert.  Report was given to the nurse on 6N, along with an update to the D/C plan.    Carol Ada, RN

## 2014-06-05 NOTE — Progress Notes (Signed)
Patient ID: Rachel [name], female   DOB: 05-Jan-1961, 54 y.o.   MRN: [id number] Advanced Heart Failure Rounding Note   Subjective:    Now on Comfort Care with dilaudid gtt. Pending possible transfer to [facility] when bed available. Somnolent but arousable. Denies pain or SOB  Objective:   Weight Range:  Vital Signs:   Temp:  [98.5 F (36.9 C)] 98.5 F (36.9 C) (02/27 1137) Resp:  [20] 20 (02/27 1137) BP: (94)/(58) 94/58 mmHg (02/27 1137) SpO2:  [94 %] 94 % (02/27 1137) Last BM Date: 05/29/14  Weight change: Filed Weights   06/02/14 0500 06/03/14 0500 06/04/14 0300  Weight: 75.1 kg (165 lb 9.1 oz) 74.8 kg (164 lb 14.5 oz) 74.1 kg (163 lb 5.8 oz)    Intake/Output:   Intake/Output Summary (Last 24 hours) at 06/05/14 1110 Last data filed at 06/05/14 0843  Gross per 24 hour  Intake   14.5 ml  Output    950 ml  Net -935.5 ml     Physical Exam: General: Chronically ill. Somnolent. On NRB Psych: Normal affect. Neuro: Alert and oriented X 3. Moves all extremities spontaneously. HEENT: Normal Neck: No bruits. + elevated JVD at level of ear with + HJR. Lungs: bilateral diffuse crackles c/w fibrosis. Mild expiratory wheezing on 80% NRB Heart: tachy rate, reg rhythm no s3, s4, Soft 1/6 murmur at the LUSB. Abdomen: Soft, nontender, non-distended  Extremities: No clubbing, cyanosis. 1+ edema. DP/PT/Radials 2+ and equal bilaterally.  Telemetry: NSR 70s  Labs: Basic Metabolic Panel:  Recent Labs Lab 05/31/14 1507  06/02/14 1000 06/03/14 0345 06/03/14 1103 06/03/14 1906 06/04/14 0403  NA  --   < > 137 137 135 136 136  K 5.1  < > 4.0 3.0* 2.9* 3.6 3.7  CL  --   < > 92* 83* 82* 85* 87*  CO2  --   < > 41* 48* 50* 43* 43*  GLUCOSE  --   < > 128* 119* 142* 119* 103*  BUN  --   < > _0 CREATININE  --   < > 1.11* 0.89 0.88 0.99 0.88  CALCIUM  --   < > 8.6 8.2* 8.5 8.9 9.0  MG 2.1  --   --   --   --  2.4 2.2  < > = values in this interval not  displayed.  Liver Function Tests:  Recent Labs Lab 05/31/14 0713 06/01/14 0700 06/02/14 1000 06/03/14 1103 06/04/14 0403  AST 44* 86* 86* 62* 39*  ALT 48* 93* 127* 122* 108*  ALKPHOS 65 74 65 66 64  BILITOT 1.6* 1.1 0.9 0.9 0.9  PROT 6.5 6.4 6.2 6.4 6.6  ALBUMIN 3.3* 3.3* 3.2* 3.3* 3.4*    Recent Labs Lab 05/30/14 0950  LIPASE 21   No results for input(s): AMMONIA in the last 168 hours.  CBC:  Recent Labs Lab 06/01/14 0700 06/02/14 0305 06/02/14 1000 06/03/14 0345 06/04/14 0403  WBC 13.0* 11.1* 11.1* 10.6* 11.4*  NEUTROABS 9.9*  --   --   --   --   HGB 10.3* 9.6* 10.4* 10.1* 10.2*  HCT 34.7* 33.5* 35.3* 35.4* 35.3*  MCV 88.7 88.9 90.5 90.1 92.2  PLT 356 333 317 288 306    Cardiac Enzymes:  Recent Labs Lab 05/30/14 1014 05/30/14 1834 05/30/14 2311 05/31/14 0713  TROPONINI 0.04* 0.04* 0.03 0.03    BNP: BNP (last 3 results)  Recent Labs  05/20/14 1147 05/30/14 0950  BNP  314.6* 533.5*    ProBNP (last 3 results)  Recent Labs  01/05/14 1417 02/02/14 1553 03/07/14 1036  PROBNP 549.1* 1294.0* 1560.0*      Other results:    Imaging: Dg Chest Port 1 View  06/03/2014   CLINICAL DATA:  Central catheter placement  EXAM: PORTABLE CHEST - 1 VIEW  COMPARISON:  May 30, 2014  FINDINGS: Central catheter tip is in the superior vena cava. No pneumothorax. Underlying emphysema with areas of extensive interstitial fibrosis remains stable. There is new mild atelectasis in the right base. No other new opacity is seen. Heart is enlarged with pulmonary vascularity grossly normal. Pacemaker lead is attached to the right ventricle.  IMPRESSION: New central catheter with tip in superior vena cava. No pneumothorax. Underlying fibrotic change and emphysema, stable. New mild atelectasis left base. Stable cardiomegaly.   Electronically Signed   By: Lowella Grip III M.D.   On: 06/03/2014 16:03     Medications:     Scheduled Medications: . acetaZOLAMIDE   250 mg Oral Daily  . albuterol  2.5 mg Nebulization Q4H  . ambrisentan  5 mg Oral Daily  . antiseptic oral rinse  7 mL Mouth Rinse BID  . dexamethasone  4 mg Intravenous Q24H  . furosemide  80 mg Intravenous BID  . sildenafil  80 mg Oral TID  . sodium chloride  10-40 mL Intracatheter Q12H  . sodium chloride  3 mL Intravenous Q12H  . Treprostinil  18 mcg Inhalation QID    Infusions: . HYDROmorphone 0.5 mg/hr (06/05/14 0700)    PRN Medications: albuterol, HYDROmorphone, LORazepam, ondansetron (ZOFRAN) IV, sodium chloride   Assessment/Plan    1. Acute on chronic diastolic CHF with primarily RV failure: 2. Acute on chronic respiratory failure: 3. PAH:  4. Pulmonary Sarcoid: 5. SVT: Suspect this was atypical atrial flutter.  6. DNR  Now on comfort care for end-stage sarcoid and respiratory failure. Agree with current plans. We will sign off.    Length of Stay: 6  Glori Bickers MD 06/05/2014, 11:10 AM

## 2014-06-05 NOTE — Progress Notes (Signed)
Family Medicine Teaching Service Daily Progress Note Intern Pager: [phone]  Patient name: Rachel [name] Medical record number: [id number] Date of birth: 12-Aug-1960 Age: 54 y.o. 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   2/24-2/25: Following Cr. Attempting to diurese. Heart Cath today with worsening RH F. 2/27: Patient and family have decided to pursue hospice care.  Have discontinued most interventions except comfort medications.  Assessment and Plan: STANISLAWA [name] is a 54 y.o. 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 PMH is significant for severe pHTN, pulmonary sarcoidosis, RV failure, HTN, Anxiety, cardiomyopathy, NSVT and Psychosis.  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 -C/s Palliative recs:  -discussion this weekend about GOC: Patient is now DNR. Hospice care now.  On dilaudid infusion  -cefepime / Vanc discontinued 2/27  -Continue PAH medications: Revatio 80 mg TID/Tyvaso 18 mcg QID, started on ERB: discontinue once able to be transferred to hospice care.  -Will keep steroids at 20 mg for now  -albuterol PRN  -Continue diuresis for comfort  -No Bipap  -no unnecessary blood draws   -Spoke to [place], [place] [phone]) this am.  [facility] at [facility].  He is going to work on finding inpatient hospice for Ms Mulhall. - On 100% (15 L) non-re breather with sats maintained at this O2 requirement.     Cardiomyopathy: last Echo (9/15) with EF 55-60%, moderately dilated RV with mildly decreased systolic function, PA systolic pressure 54. Rate controlled with Diltiazem after SVT / atrial flutter.  MAP 64.  Trop mildly elevated >> demand ischemia per cards. EKG: ST, Atrial premature complexes, Borderline right axis deviation Right Heart Cath > Severe PAHTN, Near Nl. PCWP. Primary RV failure.  - Diuresed nearly 2L yesterday. Continue to monitor I/O.  - Discontinue Letairis, Tyvaso and Revatio when appropriate (?transfer to hospice care). - Following cards recs.  - Diuresing with 5m of lasix BID.  - Wt. Down 8lbs since admission   Pulmonary hypertension with RV failure: last Echo (9/15) with EF 55-60%, moderately dilated RV with mildly decreased systolic function, PA systolic pressure 54. Pt scheduled for cath 2/25 with Dr. MAundra Dubin- Cardiology with RBelle Vernonas above. Severe PAHTN and RV failure.  - Per pulm may need heart / lung txp.  - Diuresis per cards / pulm. Following blood pressures closely given preload dependence.   Congestive Hepatopathy - Pt. Here with some mild TTP of RUQ, mild transaminitis and tbili 1.6 yesterday. Amiodarone could also contribute to transaminitis, now DC'd. Following LFT's. This in the setting of known RHF. JVP on exam, but difficult to evaluate hepatojugular reflux.  - Hep panel negative, TSH normal,  - Continue diuresis as above  AKI -  Resolving. - Scr back to baseline. - Will discontinue BMETs in setting of comfort/hospice care.   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 Given end stage lung disease and normalization of LV function, the device was not replaced at ERI and tachy therapies were turned off.   Anxiety/Depression: Paxil 20  mg, Abilify 43m discontinued by Palliative   GERD: discontinued PPI  FEN/GI: Normal diet Prophylaxis: none to hospice   [facility]. CSW working on hospice placement for EOL care.  Subjective:   Patient reports that she is feeling better than yesterday.  She denies any concerns at this time.  Denies CP, n/v/abdominal pain. Declines pastoral services this am.  Objective: Temp:  [98.5 F (36.9 C)] 98.5 F (36.9 C) (02/27 1137) Resp:  [20] 20 (02/27 1137) BP: (94)/(58) 94/58 mmHg (02/27 1137) SpO2:  [84 %-94 %] 94 % (02/27 1137) Physical Exam: General: NAD, Sitting up in bed having breakfast; NRB. Cardiovascular: RRR, RSB systolic ejection murmur 2/6, Right parasternal heave. O2 Mask in place.    Respiratory: Mild labored breathing. Bibasilar crackles noted.  Extremities: 1+ LE edema.  Neuro: follows commands, mumbles words Psych: poor eye contact, flat affect  Laboratory:  Recent Labs Lab 06/02/14 1000 06/03/14 0345 06/04/14 0403  WBC 11.1* 10.6* 11.4*  HGB 10.4* 10.1* 10.2*  HCT 35.3* 35.4* 35.3*  PLT 317 288 306    Recent Labs Lab 06/02/14 1000  06/03/14 1103 06/03/14 1906 06/04/14 0403  NA 137  < > 135 136 136  K 4.0  < > 2.9* 3.6 3.7  CL 92*  < > 82* 85* 87*  CO2 41*  < > 50* 43* 43*  BUN 21  < > _0 CREATININE 1.11*  < > 0.88 0.99 0.88  CALCIUM 8.6  < > 8.5 8.9 9.0  PROT 6.2  --  6.4  --  6.6  BILITOT 0.9  --  0.9  --  0.9  ALKPHOS 65  --  66  --  64  ALT 127*  --  122*  --  108*  AST 86*  --  62*  --  39*  GLUCOSE 128*  < > 142* 119* 103*  < > = values in this interval not displayed.  RVP - pending Legionella - pending Strep pneumo urine - negative.   Trop - 0.04 > 0.03 TSH - 1.048 Lactic Acid - 1.21  ABG -  PH - 7.36 CO2 - 87.9 Bicarb - 48  Imaging/Diagnostic Tests:  CXR 2/22 - IMPRESSION: Emphysema with extensive interstitial fibrotic change, stable. No frank edema or consolidation. Stable cardiomegaly.   AJanora Norlander DO 06/05/2014, 8:09 AM PGY-1, [facility] pager: [phone] text pages welcome

## 2014-06-06 DIAGNOSIS — R0902 Hypoxemia: Secondary | ICD-10-CM

## 2014-06-06 MED ORDER — POLYETHYLENE GLYCOL 3350 17 G PO PACK: 17.0000 g | PACK | Freq: Every day | ORAL | Status: DC | PRN

## 2014-06-06 MED ORDER — SENNOSIDES-DOCUSATE SODIUM 8.6-50 MG PO TABS
2.0000 | ORAL_TABLET | Freq: Every day | ORAL | Status: DC
  Administered 2014-06-06: 2 via ORAL
  Filled 2014-06-06: qty 2

## 2014-06-06 MED ORDER — SENNOSIDES-DOCUSATE SODIUM 8.6-50 MG PO TABS
2.0000 | ORAL_TABLET | Freq: Once | ORAL | Status: AC
  Administered 2014-06-06: 2 via ORAL
  Filled 2014-06-06: qty 2

## 2014-06-06 MED ORDER — WHITE PETROLATUM GEL
Status: AC
  Administered 2014-06-06: 0.2
  Filled 2014-06-06: qty 1

## 2014-06-06 NOTE — Progress Notes (Signed)
Patient has very poor prognosis I anticipate death within hours-days at best. She is appropriate for Hospice Facility level care and will likely need very close symptom management including IV infusion titration. If the patient has an overwhelming desire to go home for EOL and this can be supported by the family, hospice can certainly make this happen. Will follow up with patient and family this afternoon.  Lane Hacker, DO Palliative Medicine

## 2014-06-06 NOTE — Progress Notes (Signed)
Family Practice Teaching Service Interval Progress Note  Patient currently with an ICD device. Note this was ordered to be turned off by Palliative Care today 06/06/14, given current plans to transition to comfort care at [facility]. Contacted Cardiology and confirmed this plan with them, and they will notify the appropriate device rep of plans to turn off ICD.  Nobie Putnam, DO Family Medicine PGY-2 Service Pager [phone]

## 2014-06-06 NOTE — Progress Notes (Signed)
PULMONARY / CRITICAL CARE MEDICINE   Name: Rachel [name] MRN: [id number] DOB: 04/07/1961    ADMISSION DATE:  05/30/2014 CONSULTATION DATE:  05/30/2014  REFERRING MD :  EDP  CHIEF COMPLAINT:  Productive cough  INITIAL PRESENTATION: 54 year old female with severe sarcoidosis and PAH presented to [facility] ED 2/22 c/o productive cough, vomiting, and weakness for several weeks. In ED she was hypoxemic requiring 100% NRB. PCCM consulted  STUDIES:  Echo 9/15 >EF 55-60%, moderately dilated RV with mildly decreased systolic function, PA systolic pressure 54.   SIGNIFICANT EVENTS:  HISTORY OF PRESENT ILLNESS:  54 year old female with PMH as below, which is significant for stage IV pulmonary sarcoidosis, mod-severe secondary PAH, NSVT (ICD in place).  Well known to me from office practice.  She takes Revatio and Tyvaso at home. Underwent RHC 2/25 under Dr. McLean who is her primary cardiologist. She was seen by him in the office 2/12 at which time her lasix was increased due to progressive LE edema. Plan was to consider transplant workup again.  Most recent pulmonary appointment 1/14. It was recommended that she increase her O2 and prednisone, however, she was opposed to these changes - the O2 bothers her nose, high dose pred gives her severe side effects.   She presented to [facility] ED 2/22 c/o weakness, malaise, diarrhea that started 5 days PTA.  She has also had productive cough for 2 weeks. She was noted to be hypoxemic and was placed on 100% NRB en route to ED. In ED she was found to have stable chronic findings on CXR with mild leukocytosis and elevated BNP. She was hypoxic, requiring NRB to maintain sats in low 80s (85-90% is her baseline). Her home steroids were continued and she was started on empiric HCAP coverage. She was admitted to [facility].  SUBJECTIVE:  Now on floor, comfort care.  Awake, comfortable.  States breathing is fair.  Eating crackers.    VITAL SIGNS: Temp:  [97.4 F (36.3 C)-98 F (36.7 C)]  97.4 F (36.3 C) (02/29 0616) Pulse Rate:  [82-91] 82 (02/29 0616) Resp:  [16-22] 20 (02/29 0616) BP: (95-101)/(54-68) 101/68 mmHg (02/29 0616) SpO2:  [96 %-100 %] 100 % (02/29 0616) Weight:  [165 lb 2 oz (74.9 kg)] 165 lb 2 oz (74.9 kg) (02/28 1534) INTAKE / OUTPUT:  Intake/Output Summary (Last 24 hours) at 06/06/14 1054 Last data filed at 06/06/14 0900  Gross per 24 hour  Intake      1 ml  Output   2650 ml  Net  -2649 ml    PHYSICAL EXAMINATION: General:  Weak, interacts but depressed affect Neuro:  Globally weak, oriented x3 HEENT:  OP dry, PERRL, NRB Cardiovascular:  Regular, loud s2 Lungs:  Distant, B insp crackles Abdomen:  Soft, benign, non tender  Musculoskeletal:  Trace edema Skin:  No rash   ASSESSMENT / PLAN:  Acute on chronic respiratory failure in setting acute infection, superimposed on severe PAH / sarcoidosis  Sarcoidosis Stage IV with severe parenchymal scar and infiltrate Severe secondary PAH, better defined by PA-c on 2/25 Chronic severe hypoxemia Apparent acute viral syndrome with diarrhea, URI sx Possible PNA, doubt. Her pulmonary infiltrates are chronic  Now on floor.  Met with palliative care and has chosen DNR/transition to comfort care.  Hopeful for [facility].   Recs:  - continue diuresis, PRN BD, dilaudid gtt  - palliative care managing symptoms - continue tyvaso, revatio, letairis for now - may stop once [facility] confirmed, symptoms are managed with dilaudid  - O2 as needed for comfort   Appreciate palliative care and Teaching Service assistance.  I will update Dr. Byrum.   PCCM signing off.    Katy Whiteheart, NP 06/06/2014  10:54 AM Pager: [phone] or [phone]     Attending:  I have seen and examined the patient with nurse practitioner/resident and agree with the note above.   Sad case, but she is comfortable now Lungs: crackles bilaterally, heart with vigorous palpation  Continue palliative  measures  PCCM to sign off  Brent McQuaid, MD [place] PCCM Pager: [phone] Cell: [phone] If no response, call [phone]   

## 2014-06-06 NOTE — Clinical Social Work Psychosocial (Addendum)
Clinical Social Work Department BRIEF PSYCHOSOCIAL ASSESSMENT 06/03/2014  Patient:  Rachel [name], Rachel [name]     Account Number:  [id number]     Admit date:  05/30/2014  Clinical Social Worker:  Elam Dutch  Date/Time:  06/03/2014 02:55 PM  Referred by:  Physician  Date Referred:  06/02/2014 Referred for  Other - See comment   Other Referral:   Home issues impacting care   Interview type:  Other - See comment Other interview type:   Patient and daughter Rachel [name]    PSYCHOSOCIAL DATA Living Status:  FAMILY Admitted from facility:   Level of care:   Primary support name:  [name]  [phone] Primary support relationship to patient:  CHILD, ADULT Degree of support available:   Strong Support by family    [name] [phone]  Watt Climes [phone]    CURRENT CONCERNS Current Concerns  Other - See comment   Other Concerns:   home medication administration and family issues    SOCIAL WORK ASSESSMENT / PLAN 54 year old female admitted from home- referred to [facility] due to concerns by her son [name] that patient has not been taking her medications properly and now has extra family moved in with her. CSW met with patient today along with her daughter Rachel [name]. Upon first entering the room- patient appeared very hesitant about talking to [place] and refused request by CSW for daughter to allow to speak in private. "I want my daughter to hear what you have to say."  CSW addressed concerns from son/MD of medication compliance at home as well as to discuss current living arrangments. Patient related that her daughter Rachel [name], her boyfriend and their 3 children had recently moved back into the house with patient. Patien'ts son  [name] has also moved into the house and is assisting with caregiving issues. Daughter stated that prior to this- patient had "trained" her 55 year old grandchild to place her medications in her pillbox as she is normally not trusting of others  to do this for her. CSW discussed serious concerns with allowing a  55 year old to handle her medications and she acknowledged this stating that this was no longer an issue. Patient and daughter both confirmed her inability to trust other adults in her family but she refused to state why she felt this way.  Patient and daughter confirm that patient's son [name] now handles her medicaitons and this will continue as her regular practice.  Patient states that she welcomes her daughter and family at the house and denied that this is a stressor for her.  She plans to return home at d/c with continued care by family. She agrees to continued home health services wtih [name]. CSW will notify RNCM of above.   Assessment/plan status:  No Further Intervention Required Other assessment/ plan:   Information/referral to community resources:   Patient is interested in a possible Life Alert button for safety when she is at home alone.    PATIENT'S/FAMILY'S RESPONSE TO PLAN OF CARE: Patiient was very hesiitant at first to speak with and only responded reluctantly to attempts to engage her. She deferred at first to her daughter Rachel [name] but later in the discussion became more engaged and responsiive- actually sitting up in bed to talk to [place].  CSW explained role of the CSW and reason for referral and provided education and support to patient and daughter. She verbalized that she is very happy with her daughter and son living with her  and does not feel encumbered by the number of people in her home.  "They help me all the time."  Patient states that she does not feel well most of the time and is unable to fully provide for her own ADL's.  She is followed by [facility] and is agreeable for this service to return when she goes home. CSW will sign off but will be availble if needed.  Lorie Phenix. [place], [place]

## 2014-06-06 NOTE — Progress Notes (Signed)
Family Medicine Teaching Service Daily Progress Note Intern Pager: [phone]  Patient name: Rachel [name] Medical record number: [id number] Date of birth: December 12, 1960 Age: 54 y.o. 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   2/24-2/25: Following Cr. Attempting to diurese. Heart Cath today with worsening RH F. 2/27-2/29: Patient and family have decided to pursue hospice care.  Have discontinued most interventions except comfort medications.  Assessment and Plan: Rachel [name] is a 54 y.o. 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 PMH is significant for severe pHTN, pulmonary sarcoidosis, RV failure, HTN, Anxiety, cardiomyopathy, NSVT and Psychosis.  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 -C/s Palliative recs:  -discussion this weekend about GOC: Patient is now DNR. Hospice care now.  On dilaudid infusion  -Continue PAH medications: Revatio 80 mg TID/Tyvaso 18 mcg QID, started on ERB: discontinue once able to be transferred to hospice care.  -Will keep steroids at 20 mg for now  -albuterol PRN  -Continue diuresis for comfort  -No Bipap  -no unnecessary blood draws   -Pending [facility] at [facility].   - F/U palliative recs   - Nonrebreather for comfort.  - Added bowel regimen given dilaudid infusion. Miralax daily prn.   Cardiomyopathy: last Echo (9/15) with EF 55-60%, moderately dilated RV with  mildly decreased systolic function, PA systolic pressure 54. Rate controlled with Diltiazem after SVT / atrial flutter.  MAP 64.  Trop mildly elevated >> demand ischemia per cards. EKG: ST, Atrial premature complexes, Borderline right axis deviation Right Heart Cath > Severe PAHTN, Near Nl. PCWP. Primary RV failure.  - Discontinue Letairis, Tyvaso and Revatio when appropriate (?transfer to hospice care).  - Diuresing with 80m of lasix BID. For comfort.  - Wt. Stable.   Pulmonary hypertension with RV failure: last Echo (9/15) with EF 55-60%, moderately dilated RV with mildly decreased systolic function, PA systolic pressure 54. Pt scheduled for cath 2/25 with Dr. MAundra Dubin- Cardiology with RLone Treeas above. Severe PAHTN and RV failure.  - Per pulm would need heart / lung txp.  - Diuresis per cards / pulm. Following blood pressures closely given preload dependence.   Congestive Hepatopathy - Pt. Here with some mild TTP of RUQ, mild transaminitis and tbili 1.6 yesterday. Amiodarone could also contribute to transaminitis, now DC'd. Following LFT's. This in the setting of known RHF. JVP on exam, but difficult to evaluate hepatojugular reflux.  - Hep panel negative, TSH normal,  - Continue diuresis as above  AKI -  Resolved prior to discontinuation of labs.  - Scr baseline prior to discontinuation of labs.  - Diuresing as above.  - Will discontinue BMETs in setting of comfort/hospice care.   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 Given end stage lung disease and normalization of LV function, the device was not replaced at [facility] and tachy therapies were turned off.   Anxiety/Depression: Paxil 20 mg, Abilify 241mdiscontinued by Palliative   GERD: discontinued PPI  FEN/GI: Normal diet Prophylaxis: none to  hospice   Disposition: Pending placement at [facility] place.   Subjective:  No acute events overnight. Sitting up with son this am in better spirits. Asking to take a shower. Waiting for  placement at [facility].   Objective: Temp:  [97.4 F (36.3 C)-98 F (36.7 C)] 97.4 F (36.3 C) (02/29 0616) Pulse Rate:  [82-91] 82 (02/29 0616) Resp:  [16-22] 20 (02/29 0616) BP: (95-101)/(54-68) 101/68 mmHg (02/29 0616) SpO2:  [96 %-100 %] 100 % (02/29 0616) Weight:  [165 lb 2 oz (74.9 kg)] 165 lb 2 oz (74.9 kg) (02/28 1534) Physical Exam: General: NAD, Sitting up in bed having breakfast; NRB in place.  Cardiovascular: Known right parasternal heave, Tele with RRR   Respiratory: Known severe pulmonary disease. Bibasilar crackles at baseline.  Extremities: puffy lower extremities. 1+ edema.  Neuro: Speaking clearly this am, comprehension in tact, asking to take a shower.  Psych: flat affect at baseline, but better today.   Laboratory:  Recent Labs Lab 06/02/14 1000 06/03/14 0345 06/04/14 0403  WBC 11.1* 10.6* 11.4*  HGB 10.4* 10.1* 10.2*  HCT 35.3* 35.4* 35.3*  PLT 317 288 306    Recent Labs Lab 06/02/14 1000  06/03/14 1103 06/03/14 1906 06/04/14 0403  NA 137  < > 135 136 136  K 4.0  < > 2.9* 3.6 3.7  CL 92*  < > 82* 85* 87*  CO2 41*  < > 50* 43* 43*  BUN 21  < > _0 CREATININE 1.11*  < > 0.88 0.99 0.88  CALCIUM 8.6  < > 8.5 8.9 9.0  PROT 6.2  --  6.4  --  6.6  BILITOT 0.9  --  0.9  --  0.9  ALKPHOS 65  --  66  --  64  ALT 127*  --  122*  --  108*  AST 86*  --  62*  --  39*  GLUCOSE 128*  < > 142* 119* 103*  < > = values in this interval not displayed.  RVP - Negative Legionella - negative Strep pneumo urine - negative.   Trop - 0.04 > 0.03 TSH - 1.048 Lactic Acid - 1.21  ABG -  PH - 7.36 CO2 - 87.9 Bicarb - 48  Imaging/Diagnostic Tests:  CXR 2/22 - IMPRESSION: Emphysema with extensive interstitial fibrotic change, stable. No frank edema or consolidation. Stable cardiomegaly.   Rachel Hacker, MD 06/06/2014, 7:21 AM PGY-1, [place] Intern pager: [phone], text pages welcome

## 2014-06-06 NOTE — Clinical Social Work Note (Signed)
CSW received handoff from weekend CSW stating patient discharge disposition unclear: residential hospice versus home with hospice services. Per chart review, patient unable to discharge home with hospice services. CSW made appropriate residential hospice referrals. MD documented patient to have ICD turned off prior to discharge.  Patient to discharge to [facility] on 06/07/2014 once medically stable for discharge.  Lubertha Sayres, [place] [phone]) Licensed Clinical Social Worker Orthopedics [phone]) and Surgical [phone])

## 2014-06-06 NOTE — Consult Note (Signed)
Rachel [name]: Received request from [place] for family interest in [facility]. Chart reviewed. Spoke with CSW, RNCM, Dr. Hilma Favors before meeting with patient and family. Met with son Rachel [name] and his work friend to answer questions and explain services. Met with patient who is alert and oriented, able to complete registration paper work herself. Son Rachel [name] and patient's brother Rachel [name] also present. Patient agreeable for Dr. Orpah Melter to assume care. All agreeable to transfer [facility]. Will need updated DC summary faxed to [phone]. Will need RN to call report to [phone]. Please arrange transport for patient to arrive at [facility] before noon. Thank you. Rachel Conte LCSW [phone]

## 2014-06-06 NOTE — Progress Notes (Signed)
Palliative Care Team at [place] Note    Sleeping deeply, very restful after an active day with 20+ visitors and she ate a full dinner-overall responded well to initiation of comfort measures with overall improvement in her QOL and lengthening of her survival compared to the state she was in on Saturday when we transitioned her to a comfort approach to her care.  I spoke with her son at bedside- he is committed but hopefull she will survive until his baby is born -due in the next 2-3 weeks. He tells me she is fighting for that moment.   Rachel Rachel [name] isn't able to participate in conversation- appears exhausted.   OBJECTIVE: Vital Signs: BP 101/68 mmHg  Pulse 82  Temp(Src) 97.4 F (36.3 C) (Oral)  Resp 20  Ht _0  (1.6 m)  Wt 74.9 kg (165 lb 2 oz)  BMI 29.26 kg/m2  SpO2 100%   Intake and Output: 02/28 0701 - 02/29 0700 In: 1.8 [I.V.:1.8] Out: 175 [Urine:175]  Physical Exam:  Full NRB, terminally ill appearing. Restful. RN reports increase in pain this evening but patient did not desire a bolus of medication.  Allergies  Allergen Reactions  . Latex Rash  . Nitroglycerin Other (See Comments)    Patient is on Revatio  . Other Other (See Comments)    High Dose Steroids cause chemical imbalance and altered mental status  . Meperidine Hcl Other (See Comments)    REACTION: Makes her feel funny  . Tramadol Hcl Other (See Comments)    REACTION: nausea, lightheadedness, sleepiness, and dizziness  . Ace Inhibitors Other (See Comments)    unknown  . Morphine Other (See Comments)    Loopy, light headed feeling    . Propoxyphene N-Acetaminophen Rash    Medications: Scheduled Meds:  . acetaZOLAMIDE  250 mg Oral Daily  . antiseptic oral rinse  7 mL Mouth Rinse BID  . dexamethasone  4 mg Intravenous Q24H  . furosemide  80 mg Intravenous BID  . sodium chloride  10-40 mL Intracatheter Q12H  . sodium chloride  3 mL Intravenous Q12H    Continuous Infusions: .  HYDROmorphone 0.5 mg/hr (06/05/14 1504)    PRN Meds: albuterol, HYDROmorphone, LORazepam, ondansetron (ZOFRAN) IV, polyethylene glycol, sodium chloride  Stool Softner: YES  Palliative Performance Scale: 20%   Labs: CBC    Component Value Date/Time   WBC 11.4* 06/04/2014 0403   RBC 3.83* 06/04/2014 0403   HGB 10.2* 06/04/2014 0403   HCT 35.3* 06/04/2014 0403   PLT 306 06/04/2014 0403   MCV 92.2 06/04/2014 0403   MCH 26.6 06/04/2014 0403   MCHC 28.9* 06/04/2014 0403   RDW 18.1* 06/04/2014 0403   LYMPHSABS 1.5 06/01/2014 0700   MONOABS 1.5* 06/01/2014 0700   EOSABS 0.1 06/01/2014 0700   BASOSABS 0.0 06/01/2014 0700    CMET     Component Value Date/Time   NA 136 06/04/2014 0403   K 3.7 06/04/2014 0403   CL 87* 06/04/2014 0403   CO2 43* 06/04/2014 0403   GLUCOSE 103* 06/04/2014 0403   BUN 13 06/04/2014 0403   CREATININE 0.88 06/04/2014 0403   CREATININE 0.67 08/12/2013 1129   CALCIUM 9.0 06/04/2014 0403   PROT 6.6 06/04/2014 0403   ALBUMIN 3.4* 06/04/2014 0403   AST 39* 06/04/2014 0403   ALT 108* 06/04/2014 0403   ALKPHOS 64 06/04/2014 0403   BILITOT 0.9 06/04/2014 0403   GFRNONAA 74* 06/04/2014 0403   GFRAA 85* 06/04/2014 0403   ASSESSMENT/ PLAN:  Rachel [name] is approaching EOL with terminal pulmonary fibrosis and PAH. Transitioned to comfort care 2 days ago. Spike in her alertness and energy today.   ICD- patient had ICD placed in 2004, St. Jude rep came out and determined it to be non-functional and patient apparently elected to not have battery change several years ago. DNR in place.  Patient is most appropriate for [facility]- she has very high O2 requirements and may need rapid dose tritration in the event of respiratory demise. She has a prognosis of hours-days.   Appreciate CSW Erling Conte from [facility] working with this family. If she survives through the evening and remains stable enough to be moved the plan is for [facility] in the AM- family expressed interest  in [place] for grandchildren.  Maintain current dilaudid infusion- oral meds as tolerated.  25 minutes at bedside.Greater than 50%  of this time was spent counseling and coordinating care related to the above assessment and plan.   Acquanetta Chain, DO  06/06/2014, 7:14 PM  Please contact Palliative Medicine Team phone at [phone] for questions and concerns.

## 2014-06-07 MED ORDER — HEPARIN SOD (PORK) LOCK FLUSH 100 UNIT/ML IV SOLN
250.0000 [IU] | INTRAVENOUS | Status: AC | PRN
  Administered 2014-06-07: 250 [IU]

## 2014-06-07 NOTE — Progress Notes (Signed)
Received patient on Dilaudid drip at 11m/hr. Family requested if I can lower dose down. Md made aware and stated it is okey to lower dose down.

## 2014-06-07 NOTE — Discharge Summary (Signed)
[facility] Discharge Summary  Patient name: Rachel [name] Medical record number: [id number] Date of birth: [birth year] Age: 54 y.o. Gender: female Date of Admission: 05/30/2014  Date of Discharge: 06/07/14 Admitting Physician: Blane Ohara McDiarmid, MD  Primary Care Provider: Christa See, MD Consultants: Cardiology, Pulmonology, Palliative  Indication for Hospitalization: Acute on Chronic Respiratory Failure.   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   Disposition: To Hospice Care  Discharge Condition: Stable  Discharge Exam:  BP 109/67 mmHg  Pulse 87  Temp(Src) 97.7 F (36.5 C) (Oral)  Resp 21  Ht _0  (1.6 m)  Wt 165 lb 2 oz (74.9 kg)  BMI 29.26 kg/m2  SpO2 100% General: NAD, resting quietly, slow quiet breathing. . Cardiovascular: RRR, RSB systolic ejection murmur 3/6, Right parasternal heave increased. O2 Mask in place. Loud S1.   Respiratory: Lbored breathing. Bibasilar crackles noted. Decreased rate,  Extremities: 2+ LE edema.  Neuro: follows commands, mumbles words slowly.  Psych: poor eye contact, flat affect  Brief Hospital Course:  Rachel [name] is a 54 y.o. 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  Acute on Chronic Respiratory Failure: Pt. Was admitted with worsening respiratory failure in the setting of increasing oxygen requirement and suspected infection superimposed on chronic lung disease.  She was initially treated with antibiotics, workup was unrevealing, and imaging was nonfocal, though did demonstrate baseline abnormal pulmonary picture. She was started on diuresis per cardiology in order to alleviate cardiopulmonary overload due to chronic right heart failure. She has been on pulmonary arterial dilators Tyvaso, and Revatio. In addition to prednisone for management of her lung disease. She has been evaluated previously at [facility] for lung transplant, however she has decided against this. She had a right heart cath revealing normal capillary wedge pressure in the setting of these bronchodilators. However, she did have PA P revealing of severe pulmonary arterial hypertension. She as started on Letairis per cardiology. In the setting of worsening cardiopulmonary disease, she has decided to pursue comfort care given the severity of her illness and limited benefit in pursuing further medical care with little likelihood of a true cure. Per pulmonology and cardiology, she would likely need a heart and lung transplant, and may not survive such a stressful surgery. Patient has opted for hospice and palliative care. She was started on a dilaudid drip titrated for comfort prior to discharge.   Primary Right Heart Failure -  Patient has known right heart failure 2/2 dilated cardiomyopathy, pulmonary hypertension. She had a right heart catheterization by Dr. Aundra Dubin during this admission which revealed severe pulmonary arterial hypertension, primary right ventricular failure. She has been on Revatio, and Tyvaso as above. Letairis added after catheterization. Diuresis initiated on admission with worsening pulmonary status. Diuresis was continued in order to improve RV function, however her blood pressures did not tolerate well. She also had episodes of SVT during this admission, which she has experienced previously. Initially place don amiodarone, then diltiazem, however blood pressures did not tolerate. Due to  worsening cardiopulmonary disease as above with little  benefit in further treatment, patient made the decision to pursue comfort care and hospice.   Congestive Hepatopathy, AKI  - Evidence of worsening cardiopulmonary disease during this admission. AKI resolved, however Transaminitis with worsening RV failure indicative of worsening hepatic congestion.   Anxiety / Depression - Pt. With history of anxiety previously on Paxil and Abilify. Adjust as needed for comfort.   Issues for Follow Up:  1. Hospice / Palliative care - narcotics for air hunger on dilaudid infusion here.  2. Titration of Tyvaso, Revatio, and Letairis  3. Diuresis for comfort 4. Respiratory support for comfort.   Significant Procedures: Right Heart Catherteriztion - see operative report   Significant Labs and Imaging:   Recent Labs Lab 06/02/14 1000 06/03/14 0345 06/04/14 0403  WBC 11.1* 10.6* 11.4*  HGB 10.4* 10.1* 10.2*  HCT 35.3* 35.4* 35.3*  PLT 317 288 306    Recent Labs Lab 05/31/14 1507 06/01/14 0700  06/02/14 1000 06/03/14 0345 06/03/14 1103 06/03/14 1906 06/04/14 0403  NA  --  141  < > 137 137 135 136 136  K 5.1 4.5  < > 4.0 3.0* 2.9* 3.6 3.7  CL  --  94*  < > 92* 83* 82* 85* 87*  CO2  --  35*  < > 41* 48* 50* 43* 43*  GLUCOSE  --  121*  < > 128* 119* 142* 119* 103*  BUN  --  24*  < > _0 CREATININE  --  1.55*  < > 1.11* 0.89 0.88 0.99 0.88  CALCIUM  --  8.9  < > 8.6 8.2* 8.5 8.9 9.0  MG 2.1  --   --   --   --   --  2.4 2.2  ALKPHOS  --  74  --  65  --  66  --  64  AST  --  86*  --  86*  --  62*  --  39*  ALT  --  93*  --  127*  --  122*  --  108*  ALBUMIN  --  3.3*  --  3.2*  --  3.3*  --  3.4*  < > = values in this interval not displayed. RVP - pending Legionella - pending Strep pneumo urine - negative.   Trop - 0.04 > 0.03 TSH - 1.048 Lactic Acid - 1.21 ABG -  PH - 7.36 CO2 - 87.9 Bicarb - 48  Imaging/Diagnostic Tests:  CXR 2/22 - IMPRESSION: Emphysema with  extensive interstitial fibrotic change, stable. No frank edema or consolidation. Stable cardiomegaly.  Results/Tests Pending at Time of Discharge: None  Discharge Medications:    Medication List    STOP taking these medications        ARIPiprazole 2 MG tablet  Commonly known as:  ABILIFY     aspirin EC 81 MG tablet     benzonatate 100 MG capsule  Commonly known as:  TESSALON PERLES     esomeprazole 40 MG capsule  Commonly known as:  NEXIUM     nystatin 100000 UNIT/ML suspension  Commonly known as:  MYCOSTATIN     PARoxetine 20 MG tablet  Commonly known as:  PAXIL     potassium chloride SA 20 MEQ tablet  Commonly known as:  K-DUR,KLOR-CON     predniSONE 10 MG tablet  Commonly known as:  DELTASONE      TAKE these medications        acetaZOLAMIDE 250 MG tablet  Commonly  known as:  DIAMOX  Take 1 tablet (250 mg total) by mouth daily.     albuterol 108 (90 BASE) MCG/ACT inhaler  Commonly known as:  PROAIR HFA  Inhale 2 puffs into the lungs 4 (four) times daily.     ambrisentan 5 MG tablet  Commonly known as:  LETAIRIS  Take 1 tablet (5 mg total) by mouth daily.     budesonide-formoterol 160-4.5 MCG/ACT inhaler  Commonly known as:  SYMBICORT  Inhale 1 puff into the lungs 2 (two) times daily.     furosemide 20 MG tablet  Commonly known as:  LASIX  Take 3 tablets (60 mg total) by mouth 2 (two) times daily.     ondansetron 4 MG/2ML Soln injection  Commonly known as:  ZOFRAN  Inject 2 mLs (4 mg total) into the vein every 6 (six) hours as needed for nausea.     sildenafil 20 MG tablet  Commonly known as:  REVATIO  Take 4 tablets (80 mg total) by mouth 3 (three) times daily. take 4  tablets every 8 hours     Treprostinil 0.6 MG/ML Soln  Commonly known as:  TYVASO  Inhale 18 mcg into the lungs 4 (four) times daily.        Discharge Instructions: Please refer to Patient Instructions section of EMR for full details.  Patient was counseled important signs and  symptoms that should prompt return to medical care, changes in medications, dietary instructions, activity restrictions, and follow up appointments.   Follow-Up Appointments:   Aquilla Hacker, MD 06/07/2014, 9:42 AM PGY-1, [place]

## 2014-06-07 NOTE — Care Management (Signed)
Spoke to Aquilla Hacker, MD , patient is OK for transfer to [facility] today . Magdalen Spatz RN BSN

## 2014-06-07 NOTE — Progress Notes (Signed)
Discharge to [facility] place transported by [facility].

## 2014-06-07 NOTE — Discharge Instructions (Signed)
Per facility. See discharge specific instructions.

## 2014-06-07 NOTE — Progress Notes (Signed)
Report given to Leonette Monarch, RN , [facility].

## 2014-07-08 DEATH — deceased
